# Patient Record
Sex: Male | Born: 1945
Health system: Southern US, Community
[De-identification: ages and names within clinical notes are randomized; demographics above are authoritative.]

## PROBLEM LIST (undated history)

## (undated) DIAGNOSIS — H9190 Unspecified hearing loss, unspecified ear: Secondary | ICD-10-CM

## (undated) DIAGNOSIS — E7800 Pure hypercholesterolemia, unspecified: Secondary | ICD-10-CM

## (undated) DIAGNOSIS — H269 Unspecified cataract: Secondary | ICD-10-CM

## (undated) DIAGNOSIS — I517 Cardiomegaly: Secondary | ICD-10-CM

## (undated) DIAGNOSIS — K7460 Unspecified cirrhosis of liver: Secondary | ICD-10-CM

## (undated) DIAGNOSIS — Z860101 Personal history of adenomatous and serrated colon polyps: Secondary | ICD-10-CM

## (undated) DIAGNOSIS — I4819 Other persistent atrial fibrillation: Secondary | ICD-10-CM

## (undated) DIAGNOSIS — K922 Gastrointestinal hemorrhage, unspecified: Secondary | ICD-10-CM

## (undated) DIAGNOSIS — D696 Thrombocytopenia, unspecified: Secondary | ICD-10-CM

## (undated) DIAGNOSIS — I4891 Unspecified atrial fibrillation: Secondary | ICD-10-CM

## (undated) DIAGNOSIS — E538 Deficiency of other specified B group vitamins: Secondary | ICD-10-CM

## (undated) DIAGNOSIS — K449 Diaphragmatic hernia without obstruction or gangrene: Secondary | ICD-10-CM

## (undated) DIAGNOSIS — I00 Rheumatic fever without heart involvement: Secondary | ICD-10-CM

## (undated) DIAGNOSIS — I2510 Atherosclerotic heart disease of native coronary artery without angina pectoris: Secondary | ICD-10-CM

## (undated) DIAGNOSIS — I10 Essential (primary) hypertension: Secondary | ICD-10-CM

## (undated) DIAGNOSIS — J449 Chronic obstructive pulmonary disease, unspecified: Secondary | ICD-10-CM

## (undated) DIAGNOSIS — Z86010 Personal history of colonic polyps: Secondary | ICD-10-CM

## (undated) DIAGNOSIS — K2970 Gastritis, unspecified, without bleeding: Secondary | ICD-10-CM

## (undated) DIAGNOSIS — D509 Iron deficiency anemia, unspecified: Secondary | ICD-10-CM

## (undated) HISTORY — DX: Unspecified hearing loss, unspecified ear: H91.90

## (undated) HISTORY — DX: Personal history of colonic polyps: Z86.010

## (undated) HISTORY — PX: SHOULDER SURGERY: SHX246

## (undated) HISTORY — DX: Deficiency of other specified B group vitamins: E53.8

## (undated) HISTORY — DX: Unspecified atrial fibrillation: I48.91

## (undated) HISTORY — DX: Other persistent atrial fibrillation: I48.19

## (undated) HISTORY — DX: Gastritis, unspecified, without bleeding: K29.70

## (undated) HISTORY — DX: Rheumatic fever without heart involvement: I00

## (undated) HISTORY — DX: Pure hypercholesterolemia, unspecified: E78.00

## (undated) HISTORY — DX: Personal history of adenomatous and serrated colon polyps: Z86.0101

## (undated) HISTORY — DX: Unspecified cataract: H26.9

## (undated) HISTORY — DX: Diaphragmatic hernia without obstruction or gangrene: K44.9

## (undated) HISTORY — DX: Unspecified cirrhosis of liver: K74.60

## (undated) HISTORY — PX: CORONARY ANGIOPLASTY: SHX604

## (undated) HISTORY — DX: Atherosclerotic heart disease of native coronary artery without angina pectoris: I25.10

## (undated) HISTORY — DX: Gastrointestinal hemorrhage, unspecified: K92.2

## (undated) HISTORY — DX: Iron deficiency anemia, unspecified: D50.9

## (undated) HISTORY — DX: Chronic obstructive pulmonary disease, unspecified: J44.9

## (undated) HISTORY — DX: Cardiomegaly: I51.7

---

## 2001-01-15 ENCOUNTER — Ambulatory Visit (HOSPITAL_COMMUNITY): Admission: RE | Admit: 2001-01-15 | Discharge: 2001-01-16 | Payer: Self-pay | Admitting: Cardiovascular Disease

## 2001-01-15 HISTORY — PX: CARDIAC CATHETERIZATION: SHX172

## 2001-08-11 ENCOUNTER — Ambulatory Visit (HOSPITAL_COMMUNITY): Admission: RE | Admit: 2001-08-11 | Discharge: 2001-08-12 | Payer: Self-pay | Admitting: Cardiovascular Disease

## 2001-08-11 HISTORY — PX: CARDIAC CATHETERIZATION: SHX172

## 2003-04-14 ENCOUNTER — Encounter (INDEPENDENT_AMBULATORY_CARE_PROVIDER_SITE_OTHER): Payer: Self-pay | Admitting: *Deleted

## 2003-04-14 ENCOUNTER — Ambulatory Visit (HOSPITAL_COMMUNITY): Admission: RE | Admit: 2003-04-14 | Discharge: 2003-04-14 | Payer: Self-pay | Admitting: Gastroenterology

## 2004-04-20 ENCOUNTER — Ambulatory Visit (HOSPITAL_COMMUNITY): Admission: RE | Admit: 2004-04-20 | Discharge: 2004-04-20 | Payer: Self-pay | Admitting: Family Medicine

## 2004-04-27 ENCOUNTER — Ambulatory Visit (HOSPITAL_COMMUNITY): Admission: RE | Admit: 2004-04-27 | Discharge: 2004-04-27 | Payer: Self-pay | Admitting: Urology

## 2004-05-22 ENCOUNTER — Ambulatory Visit (HOSPITAL_COMMUNITY): Admission: RE | Admit: 2004-05-22 | Discharge: 2004-05-22 | Payer: Self-pay | Admitting: Urology

## 2004-08-09 ENCOUNTER — Ambulatory Visit (HOSPITAL_COMMUNITY): Admission: RE | Admit: 2004-08-09 | Discharge: 2004-08-09 | Payer: Self-pay | Admitting: Family Medicine

## 2004-08-15 ENCOUNTER — Ambulatory Visit: Payer: Self-pay | Admitting: Gastroenterology

## 2004-09-04 ENCOUNTER — Ambulatory Visit (HOSPITAL_COMMUNITY): Admission: RE | Admit: 2004-09-04 | Discharge: 2004-09-04 | Payer: Self-pay | Admitting: Cardiovascular Disease

## 2004-10-10 ENCOUNTER — Ambulatory Visit: Payer: Self-pay | Admitting: Internal Medicine

## 2004-10-13 ENCOUNTER — Ambulatory Visit (HOSPITAL_COMMUNITY): Admission: RE | Admit: 2004-10-13 | Discharge: 2004-10-13 | Payer: Self-pay

## 2004-10-13 ENCOUNTER — Encounter (INDEPENDENT_AMBULATORY_CARE_PROVIDER_SITE_OTHER): Payer: Self-pay | Admitting: Specialist

## 2004-10-24 ENCOUNTER — Ambulatory Visit: Payer: Self-pay | Admitting: Internal Medicine

## 2004-11-27 ENCOUNTER — Ambulatory Visit: Payer: Self-pay | Admitting: Internal Medicine

## 2005-01-22 ENCOUNTER — Ambulatory Visit: Payer: Self-pay | Admitting: Internal Medicine

## 2005-04-23 ENCOUNTER — Ambulatory Visit: Payer: Self-pay | Admitting: Internal Medicine

## 2005-05-23 ENCOUNTER — Inpatient Hospital Stay (HOSPITAL_COMMUNITY): Admission: AD | Admit: 2005-05-23 | Discharge: 2005-05-24 | Payer: Self-pay | Admitting: Pediatrics

## 2005-05-23 ENCOUNTER — Encounter (INDEPENDENT_AMBULATORY_CARE_PROVIDER_SITE_OTHER): Payer: Self-pay | Admitting: Cardiology

## 2005-05-26 ENCOUNTER — Inpatient Hospital Stay (HOSPITAL_COMMUNITY): Admission: EM | Admit: 2005-05-26 | Discharge: 2005-05-29 | Payer: Self-pay | Admitting: Emergency Medicine

## 2005-05-28 HISTORY — PX: CARDIAC CATHETERIZATION: SHX172

## 2005-07-30 ENCOUNTER — Encounter: Admission: RE | Admit: 2005-07-30 | Discharge: 2005-07-30 | Payer: Self-pay | Admitting: Cardiovascular Disease

## 2005-08-21 ENCOUNTER — Ambulatory Visit: Payer: Self-pay | Admitting: Internal Medicine

## 2006-01-15 ENCOUNTER — Ambulatory Visit: Payer: Self-pay | Admitting: Internal Medicine

## 2006-04-30 ENCOUNTER — Encounter: Admission: RE | Admit: 2006-04-30 | Discharge: 2006-04-30 | Payer: Self-pay | Admitting: Cardiovascular Disease

## 2006-05-14 ENCOUNTER — Ambulatory Visit: Payer: Self-pay | Admitting: Gastroenterology

## 2006-06-10 ENCOUNTER — Ambulatory Visit: Payer: Self-pay | Admitting: Gastroenterology

## 2006-06-10 DIAGNOSIS — K2970 Gastritis, unspecified, without bleeding: Secondary | ICD-10-CM

## 2006-06-10 DIAGNOSIS — K449 Diaphragmatic hernia without obstruction or gangrene: Secondary | ICD-10-CM

## 2006-06-10 HISTORY — DX: Gastritis, unspecified, without bleeding: K29.70

## 2006-06-10 HISTORY — DX: Diaphragmatic hernia without obstruction or gangrene: K44.9

## 2006-06-29 ENCOUNTER — Emergency Department (HOSPITAL_COMMUNITY): Admission: EM | Admit: 2006-06-29 | Discharge: 2006-06-29 | Payer: Self-pay | Admitting: Emergency Medicine

## 2006-06-30 ENCOUNTER — Emergency Department (HOSPITAL_COMMUNITY): Admission: EM | Admit: 2006-06-30 | Discharge: 2006-06-30 | Payer: Self-pay | Admitting: Emergency Medicine

## 2007-05-13 ENCOUNTER — Ambulatory Visit: Payer: Self-pay | Admitting: Gastroenterology

## 2007-05-13 LAB — CONVERTED CEMR LAB
Basophils Absolute: 0.1 10*3/uL (ref 0.0–0.1)
Basophils Relative: 1 % (ref 0.0–1.0)
Eosinophils Absolute: 0.2 10*3/uL (ref 0.0–0.6)
Eosinophils Relative: 2.9 % (ref 0.0–5.0)
Ferritin: 92.5 ng/mL (ref 22.0–322.0)
Folate: 10.2 ng/mL
HCT: 26.7 % — ABNORMAL LOW (ref 39.0–52.0)
Hemoglobin: 8.3 g/dL — ABNORMAL LOW (ref 13.0–17.0)
Iron: 17 ug/dL — ABNORMAL LOW (ref 42–165)
Lymphocytes Relative: 20.3 % (ref 12.0–46.0)
MCHC: 31.1 g/dL (ref 30.0–36.0)
MCV: 86.9 fL (ref 78.0–100.0)
Monocytes Absolute: 0.5 10*3/uL (ref 0.2–0.7)
Monocytes Relative: 10.4 % (ref 3.0–11.0)
Neutro Abs: 3.3 10*3/uL (ref 1.4–7.7)
Neutrophils Relative %: 65.4 % (ref 43.0–77.0)
Platelets: 185 10*3/uL (ref 150–400)
RBC: 3.08 M/uL — ABNORMAL LOW (ref 4.22–5.81)
RDW: 16.4 % — ABNORMAL HIGH (ref 11.5–14.6)
Saturation Ratios: 3.8 % — ABNORMAL LOW (ref 20.0–50.0)
Transferrin: 321 mg/dL (ref >=212.0)
Vitamin B-12: 208 pg/mL — ABNORMAL LOW (ref 211–911)
WBC: 5.2 10*3/uL (ref 4.5–10.5)

## 2007-05-19 ENCOUNTER — Encounter: Admission: RE | Admit: 2007-05-19 | Discharge: 2007-05-19 | Payer: Self-pay | Admitting: Cardiovascular Disease

## 2007-06-10 ENCOUNTER — Ambulatory Visit: Payer: Self-pay | Admitting: Gastroenterology

## 2007-06-10 LAB — CONVERTED CEMR LAB
Ammonia: 31 umol/L (ref 11–35)
Basophils Absolute: 0.1 10*3/uL (ref 0.0–0.1)
Basophils Relative: 1.3 % — ABNORMAL HIGH (ref 0.0–1.0)
Eosinophils Absolute: 0.1 10*3/uL (ref 0.0–0.7)
Eosinophils Relative: 2.9 % (ref 0.0–5.0)
Ferritin: 119.3 ng/mL (ref 22.0–322.0)
Folate: 16.6 ng/mL
HCT: 37.2 % — ABNORMAL LOW (ref 39.0–52.0)
Hemoglobin: 11.6 g/dL — ABNORMAL LOW (ref 13.0–17.0)
Iron: 36 ug/dL — ABNORMAL LOW (ref 42–165)
Lymphocytes Relative: 25.8 % (ref 12.0–46.0)
MCHC: 31.1 g/dL (ref 30.0–36.0)
MCV: 87.3 fL (ref 78.0–100.0)
Monocytes Absolute: 0.4 10*3/uL (ref 0.1–1.0)
Monocytes Relative: 10.3 % (ref 3.0–12.0)
Neutro Abs: 2.4 10*3/uL (ref 1.4–7.7)
Neutrophils Relative %: 59.7 % (ref 43.0–77.0)
Platelets: 161 10*3/uL (ref 150–400)
RBC: 4.26 M/uL (ref 4.22–5.81)
RDW: 19.9 % — ABNORMAL HIGH (ref 11.5–14.6)
Saturation Ratios: 8.2 % — ABNORMAL LOW (ref 20.0–50.0)
Transferrin: 312.2 mg/dL (ref >=212.0)
Vitamin B-12: >1500 pg/mL — ABNORMAL HIGH (ref 211–911)
WBC: 4.1 10*3/uL — ABNORMAL LOW (ref 4.5–10.5)

## 2007-12-01 ENCOUNTER — Encounter: Payer: Self-pay | Admitting: Gastroenterology

## 2008-01-19 ENCOUNTER — Encounter: Payer: Self-pay | Admitting: Gastroenterology

## 2008-05-27 DIAGNOSIS — D696 Thrombocytopenia, unspecified: Secondary | ICD-10-CM

## 2008-05-27 HISTORY — DX: Thrombocytopenia, unspecified: D69.6

## 2008-06-12 ENCOUNTER — Emergency Department (HOSPITAL_COMMUNITY): Admission: EM | Admit: 2008-06-12 | Discharge: 2008-06-13 | Payer: Self-pay | Admitting: Emergency Medicine

## 2008-06-14 ENCOUNTER — Emergency Department (HOSPITAL_COMMUNITY): Admission: EM | Admit: 2008-06-14 | Discharge: 2008-06-14 | Payer: Self-pay | Admitting: Emergency Medicine

## 2008-08-11 ENCOUNTER — Encounter: Payer: Self-pay | Admitting: Gastroenterology

## 2008-10-11 DIAGNOSIS — K2970 Gastritis, unspecified, without bleeding: Secondary | ICD-10-CM | POA: Insufficient documentation

## 2008-10-11 DIAGNOSIS — Z860100 Personal history of colon polyps, unspecified: Secondary | ICD-10-CM | POA: Insufficient documentation

## 2008-10-11 DIAGNOSIS — E7800 Pure hypercholesterolemia, unspecified: Secondary | ICD-10-CM | POA: Insufficient documentation

## 2008-10-11 DIAGNOSIS — E538 Deficiency of other specified B group vitamins: Secondary | ICD-10-CM | POA: Insufficient documentation

## 2008-10-11 DIAGNOSIS — K2990 Gastroduodenitis, unspecified, without bleeding: Secondary | ICD-10-CM

## 2008-10-11 DIAGNOSIS — I2510 Atherosclerotic heart disease of native coronary artery without angina pectoris: Secondary | ICD-10-CM | POA: Insufficient documentation

## 2008-10-11 DIAGNOSIS — K449 Diaphragmatic hernia without obstruction or gangrene: Secondary | ICD-10-CM | POA: Insufficient documentation

## 2008-10-11 DIAGNOSIS — I4891 Unspecified atrial fibrillation: Secondary | ICD-10-CM | POA: Insufficient documentation

## 2008-10-11 DIAGNOSIS — D509 Iron deficiency anemia, unspecified: Secondary | ICD-10-CM | POA: Insufficient documentation

## 2008-10-12 ENCOUNTER — Ambulatory Visit: Payer: Self-pay | Admitting: Gastroenterology

## 2008-10-12 DIAGNOSIS — K921 Melena: Secondary | ICD-10-CM | POA: Insufficient documentation

## 2008-12-28 ENCOUNTER — Encounter: Payer: Self-pay | Admitting: Gastroenterology

## 2009-09-02 ENCOUNTER — Encounter: Payer: Self-pay | Admitting: Gastroenterology

## 2010-01-04 ENCOUNTER — Encounter: Payer: Self-pay | Admitting: Gastroenterology

## 2010-01-10 ENCOUNTER — Telehealth: Payer: Self-pay | Admitting: Gastroenterology

## 2010-02-14 ENCOUNTER — Ambulatory Visit: Payer: Self-pay | Admitting: Gastroenterology

## 2010-02-14 LAB — CONVERTED CEMR LAB
Folate: 16.6 ng/mL
Iron: 106 ug/dL (ref 42–165)
Saturation Ratios: 28.2 % (ref 20.0–50.0)
Transferrin: 268.3 mg/dL (ref 212.0–360.0)
Vitamin B-12: 347 pg/mL (ref 211–911)

## 2010-02-15 ENCOUNTER — Telehealth: Payer: Self-pay | Admitting: Gastroenterology

## 2010-03-01 ENCOUNTER — Encounter: Payer: Self-pay | Admitting: Internal Medicine

## 2010-03-10 ENCOUNTER — Encounter: Payer: Self-pay | Admitting: Internal Medicine

## 2010-03-19 ENCOUNTER — Encounter: Payer: Self-pay | Admitting: Family Medicine

## 2010-03-23 ENCOUNTER — Encounter: Payer: Self-pay | Admitting: Internal Medicine

## 2010-03-30 NOTE — Miscellaneous (Signed)
Summary: Tandem Refill  Clinical Lists Changes  Medications: Added new medication of TANDEM PLUS 162-115.2-1 MG CAPS (FEFUM-FEPO-FA-B CMP-C-ZN-MN-CU) take one by mouth once daily - Signed Rx of TANDEM PLUS 162-115.2-1 MG CAPS (FEFUM-FEPO-FA-B CMP-C-ZN-MN-CU) take one by mouth once daily;  #30 x 11;  Signed;  Entered by: Harlow Mares CMA;  Authorized by: Mardella Layman MD [facility];  Method used: Electronically to [facility] [address], [place], [place], [place]  [address], Ph: [phone], Fax: [phone]    Prescriptions: TANDEM PLUS 162-115.2-1 MG CAPS (FEFUM-FEPO-FA-B CMP-C-ZN-MN-CU) take one by mouth once daily  #30 x 11   Entered by:   Harlow Mares CMA   Authorized by:   Mardella Layman MD FACG,FAGA   Signed by:   Harlow Mares CMA on 01/19/2008   Method used:   Electronically to        [facility]  [address] (retail)       [address]       [place], [place]  [address]       Ph: [phone]       Fax: [phone]   RxID:   [id number]

## 2010-03-30 NOTE — Assessment & Plan Note (Signed)
Summary: med refill--ch.    History of Present Illness Visit Type: Follow-up Visit Primary GI MD: Sheryn Bison MD FACP FAGA Primary Provider: Rudi Heap, MD Requesting Provider: n/a Chief Complaint: Medication refill on Hemocyte plus, wants cheaper medication History of Present Illness:   this patient is currently asymptomatic and denies any GI complaints. He has regular bowel movements without melena or hematochezia. His chronic iron deficiency has been corrected but he continues on aspirin and Coumadin. He denies current cardiovascular problems except for easy bruisability. Recent labs reviewed from Dr. Alden Server all normal with a hemoglobin of 15. He also relates that Hemoccult cards were guaiac negative.   GI Review of Systems      Denies abdominal pain, acid reflux, belching, bloating, chest pain, dysphagia with liquids, dysphagia with solids, heartburn, loss of appetite, nausea, vomiting, vomiting blood, weight loss, and  weight gain.        Denies anal fissure, black tarry stools, change in bowel habit, constipation, diarrhea, diverticulosis, fecal incontinence, heme positive stool, hemorrhoids, irritable bowel syndrome, jaundice, light color stool, liver problems, rectal bleeding, and  rectal pain.    Current Medications (verified): 1)  Hemocyte Plus 106-1 Mg Caps (B Complex-C-Min-Fe-Fa) .Marland Kitchen.. 1 By Mouth Once Daily .Marland Kitchen...must Have Office Visit 2)  Verapamil Hcl Cr 240 Mg Xr24h-Cap (Verapamil Hcl) .... Take One By Mouth Once Daily 3)  Aspirin 81 Mg Tbec (Aspirin) .... Take One By Mouth Once Daily 4)  Lasix 20 Mg Tabs (Furosemide) .... Take One By Mouth Every Other Day 5)  Klor-Con 10 10 Meq Cr-Tabs (Potassium Chloride) .Marland Kitchen.. 1 Tablet By Mouth Once Daily 6)  Coumadin 7.5 Mg Tabs (Warfarin Sodium) .... Take As Directed  Tues and Thurs 7)  Crestor 10 Mg Tabs (Rosuvastatin Calcium) .... Take One By Mouth At Bedtime 8)  Omeprazole 20 Mg Cpdr (Omeprazole) .Marland Kitchen.. 1 Capsule By Mouth  Once Daily 9)  Coumadin 5 Mg Tabs (Warfarin Sodium) .... Take Mon, Wed, Fri, Sat and Sun 10)  Vitamin D3 2000 Unit Tabs (Cholecalciferol) .Marland Kitchen.. 1 By Mouth Two Times A Day  Allergies (verified): 1)  ! Tetracycline  Past History:  Family History: Last updated: 10/11/2008 Family History of Colon Cancer: paternal grandfather  Social History: Last updated: 10/11/2008 Occupation: retired from [employer] Married with two sons and three daughters Patient has never smoked.  Alcohol Use - no Illicit Drug Use - no  Past medical, surgical, family and social histories (including risk factors) reviewed for relevance to current acute and chronic problems.  Past Medical History: Reviewed history from 10/11/2008 and no changes required. Current Problems:  FIBRILLATION, ATRIAL (ICD-427.31) CORONARY ARTERY DISEASE (ICD-414.00) HYPERCHOLESTEROLEMIA (ICD-272.0) COUMADIN THERAPY (ICD-V58.61) ANEMIA, IRON DEFICIENCY (ICD-280.9) B12 DEFICIENCY (ICD-266.2) COLONIC POLYPS, ADENOMATOUS, HX OF (ICD-V12.72) GASTRITIS (ICD-535.50) HIATAL HERNIA WITH REFLUX (ICD-553.3)  Past Surgical History: Reviewed history from 10/11/2008 and no changes required. angioplasty 2002 and 2003  Family History: Reviewed history from 10/11/2008 and no changes required. Family History of Colon Cancer: paternal grandfather  Social History: Reviewed history from 10/11/2008 and no changes required. Occupation: retired from Patent examiner Married with two sons and three daughters Patient has never smoked.  Alcohol Use - no Illicit Drug Use - no  Review of Systems  The patient denies allergy/sinus, anemia, anxiety-new, arthritis/joint pain, back pain, blood in urine, breast changes/lumps, change in vision, confusion, cough, coughing up blood, depression-new, fainting, fatigue, fever, headaches-new, hearing problems, heart murmur, heart rhythm changes, itching, menstrual pain, muscle pains/cramps, night sweats,  nosebleeds, pregnancy  symptoms, shortness of breath, skin rash, sleeping problems, sore throat, swelling of feet/legs, swollen lymph glands, thirst - excessive , urination - excessive , urination changes/pain, urine leakage, vision changes, and voice change.    Vital Signs:  Patient profile:   65 year old male Height:      69.5 inches Weight:      211 pounds BMI:     30.82 BSA:     2.13 Pulse rate:   68 / minute Pulse rhythm:   regular BP sitting:   130 / 64  (left arm)  Vitals Entered By: Merri Ray CMA Duncan Dull) (February 14, 2010 11:26 AM)  Physical Exam  General:  Well developed, well nourished, no acute distress.healthy appearing.   Head:  Normocephalic and atraumatic. Eyes:  PERRLA, no icterus.exam deferred to patient's ophthalmologist.   Abdomen:  Soft, nontender and nondistended. No masses, hepatosplenomegaly or hernias noted. Normal bowel sounds. Extremities:  No clubbing, cyanosis, edema or deformities noted.numerous ecchymoses and bruises noted over his extremities. Psych:  Alert and cooperative. Normal mood and affect.   Impression & Recommendations:  Problem # 1:  ANEMIA, IRON DEFICIENCY (ICD-280.9) Assessment Improved iron levels order to better advise this patient as to discontinuation of oral iron therapy. It is essential that he continue on daily PPI therapy with his aspirin and Coumadin and history of previous chronic GI blood loss. He is up-to-date on his colonoscopy and endoscopy exams. Orders: TLB-B12, Serum-Total ONLY (82607-B12) TLB-Iron, (Fe) Total (83540-FE) TLB-IBC Pnl (Iron/FE;Transferrin) (83550-IBC) TLB-Folic Acid (Folate) (82746-FOL)  Problem # 2:  CORONARY ARTERY DISEASE (ICD-414.00) Assessment: Unchanged continue other cardiac medications per Dr. Rudi Heap and cardiology.  Patient Instructions: 1)  Your physician requests that you go to the basement floor of our office to have the following labwork completed before leaving today: Anemia  Profile 2)  Copy sent to : Rudi Heap, MD 3)  The medication list was reviewed and reconciled.  All changed / newly prescribed medications were explained.  A complete medication list was provided to the patient / caregiver.

## 2010-03-30 NOTE — Medication Information (Signed)
Summary: Prior authorization & Approved for Omeprazole/[facility]  Prior authorization & Approved for Omeprazole/[facility]   Imported By: Sherian Rein 12/31/2008 11:52:25  _____________________________________________________________________  External Attachment:    Type:   Image     Comment:   External Document

## 2010-03-30 NOTE — Medication Information (Signed)
Summary: Approved/[facility]  Approved/[facility]   Imported By: Lester Claycomo 01/11/2010 10:21:35  _____________________________________________________________________  External Attachment:    Type:   Image     Comment:   External Document

## 2010-03-30 NOTE — Progress Notes (Signed)
Summary: Lab results   Phone Note Call from Patient Call back at Home Phone [phone]   Caller: Patient Call For: Dr. Jarold Motto Reason for Call: Lab or Test Results Summary of Call: Calling about his Lab results Initial call taken by: Karna Christmas,  February 15, 2010 3:59 PM  Follow-up for Phone Call        ContinuePatient notified of laboratory results Dr Jarold Motto he says you discussed stopping the iron if labs were ok.  Please advise. Follow-up by: Darcey Nora RN, CGRN,  February 15, 2010 4:44 PM  Additional Follow-up for Phone Call Additional follow up Details #1::        can stop and check hb in 6 weeks Additional Follow-up by: Mardella Layman MD Clementeen Graham,  February 16, 2010 8:19 AM    Additional Follow-up for Phone Call Additional follow up Details #2::    patient aware.  new lab orders entered for 03/31/10 Follow-up by: Darcey Nora RN, CGRN,  February 16, 2010 9:52 AM

## 2010-03-30 NOTE — Progress Notes (Signed)
Summary: Medication   Phone Note Call from Patient Call back at Home Phone [phone]   Caller: Patient Call For: Dr. Jarold Motto Details for Reason: Medication Summary of Call: Pt. called stating he had received authorization for his medication in the mail today. Please call. Initial call taken by: Schuyler Amor,  January 10, 2010 3:34 PM  Follow-up for Phone Call        pt recieved auth in the mail today and he called pharm and they have his rx ready now. Follow-up by: Harlow Mares CMA Duncan Dull),  January 10, 2010 3:37 PM

## 2010-03-30 NOTE — Assessment & Plan Note (Signed)
Summary: YEARLY F-UP/YF    History of Present Illness Visit Type: Follow-up Visit Primary GI MD: Sheryn Bison MD FACP FAGA Primary Provider: Rudi Heap, MD Requesting Provider: Rudi Heap, MD Chief Complaint: Hemoccult positive stool card  History of Present Illness:   Joshua [name] had a Hemoccult positive stool occult blood testing. His hemoglobin was 14.5 up from 8.6 one year ago when he was found to have erosive gastritis related to aspirin and NSAID use. Colonoscopy was 2 years ago was unremarkable. He continues on daily omeprazole 20 mg and also aspirin 81 mg a day and he takes daily tandem for iron replacement. He is totally asymptomatic in terms of any GI complaints. This of note he also is chronically coumadinized cause of his atrial fibrillation and coronary artery disease. He additionally is on B12 replacement therapy. Septae is good and his weight is stable. He denies other NSAID use.   GI Review of Systems      Denies abdominal pain, acid reflux, belching, bloating, chest pain, dysphagia with liquids, dysphagia with solids, heartburn, loss of appetite, nausea, vomiting, vomiting blood, weight loss, and  weight gain.        Denies anal fissure, black tarry stools, change in bowel habit, constipation, diarrhea, diverticulosis, fecal incontinence, heme positive stool, hemorrhoids, irritable bowel syndrome, jaundice, light color stool, liver problems, rectal bleeding, and  rectal pain.    Current Medications (verified): 1)  Tandem Plus 162-115.2-1 Mg Caps (Fefum-Fepo-Fa-B Cmp-C-Zn-Mn-Cu) .... Take One By Mouth Once Daily 2)  Verapamil Hcl Cr 240 Mg Xr24h-Cap (Verapamil Hcl) .... Take One By Mouth Once Daily 3)  Aspirin 81 Mg Tbec (Aspirin) .... Take One By Mouth Once Daily 4)  Lasix 20 Mg Tabs (Furosemide) .... Take One By Mouth Every Other Day 5)  Klor-Con 10 10 Meq Cr-Tabs (Potassium Chloride) .Marland Kitchen.. 1 Tablet By Mouth Once Daily 6)  Coumadin 7.5 Mg Tabs (Warfarin Sodium) .... Take  As Directed  Tues and Thurs 7)  Crestor 10 Mg Tabs (Rosuvastatin Calcium) .... Take One By Mouth At Bedtime 8)  Omeprazole 20 Mg Cpdr (Omeprazole) .Marland Kitchen.. 1 Capsule By Mouth Once Daily 9)  Coumadin 5 Mg Tabs (Warfarin Sodium) .... Take Mon, Wed, Fri, Sat and Sun  Allergies (verified): 1)  ! Tetracycline  Past History:  Past medical, surgical, family and social histories (including risk factors) reviewed for relevance to current acute and chronic problems.  Past Medical History: Reviewed history from 10/11/2008 and no changes required. Current Problems:  FIBRILLATION, ATRIAL (ICD-427.31) CORONARY ARTERY DISEASE (ICD-414.00) HYPERCHOLESTEROLEMIA (ICD-272.0) COUMADIN THERAPY (ICD-V58.61) ANEMIA, IRON DEFICIENCY (ICD-280.9) B12 DEFICIENCY (ICD-266.2) COLONIC POLYPS, ADENOMATOUS, HX OF (ICD-V12.72) GASTRITIS (ICD-535.50) HIATAL HERNIA WITH REFLUX (ICD-553.3)  Past Surgical History: Reviewed history from 10/11/2008 and no changes required. angioplasty 2002 and 2003  Family History: Reviewed history from 10/11/2008 and no changes required. Family History of Colon Cancer: paternal grandfather  Social History: Reviewed history from 10/11/2008 and no changes required. Occupation: retired from Patent examiner Married with two sons and three daughters Patient has never smoked.  Alcohol Use - no Illicit Drug Use - no  Review of Systems  The patient denies allergy/sinus, anemia, anxiety-new, arthritis/joint pain, back pain, blood in urine, breast changes/lumps, change in vision, confusion, cough, coughing up blood, depression-new, fainting, fatigue, fever, headaches-new, hearing problems, heart murmur, heart rhythm changes, itching, menstrual pain, muscle pains/cramps, night sweats, nosebleeds, pregnancy symptoms, shortness of breath, skin rash, sleeping problems, sore throat, swelling of feet/legs, swollen lymph glands, thirst - excessive , urination -  excessive , urination changes/pain,  urine leakage, vision changes, and voice change.    Vital Signs:  Patient profile:   65 year old male Height:      69.5 inches Weight:      199 pounds BMI:     29.07 Pulse rate:   64 / minute Pulse rhythm:   regular BP sitting:   140 / 60  (left arm)  Vitals Entered By: Milford Cage NCMA (October 12, 2008 3:31 PM)  Physical Exam  General:  Well developed, well nourished, no acute distress.healthy appearing.   Head:  Normocephalic and atraumatic. Eyes:  PERRLA, no icterus.exam deferred to patient's ophthalmologist.   Neck:  Supple; no masses or thyromegaly. Lungs:  decreased BS on L and decreased BS on R.   Heart:  Regular rate and rhythm; no murmurs, rubs,  or bruits.heart murmur systolic:.   Abdomen:  Soft, nontender and nondistended. No masses, hepatosplenomegaly or hernias noted. Normal bowel sounds. Msk:  Symmetrical with no gross deformities. Normal posture. Extremities:  No clubbing, cyanosis, edema or deformities noted.Superficial varicosities are noted in the lower extremities. There is no evidence of acute phlebitis. Neurologic:  Alert and  oriented x4;  grossly normal neurologically. Psych:  Alert and cooperative. Normal mood and affect.   Impression & Recommendations:  Problem # 1:  HEMOCCULT POSITIVE STOOL (ICD-578.1) Assessment Unchanged I feel this is probably from erosive gastritis associated with his salicylate use and do not think we need to repeat his GI evaluation at this time. His hemoglobin has steadily risen to normal levels on iron therapy and B12 replacement and I've advised him to continue the use as previously outlined. I have suggested to him that he have CBC every 6 months as per Dr. Christell Constant. Should he have evidence of advancing anemia, capsule endoscopy exam would seem reasonable.  Problem # 2:  FIBRILLATION, ATRIAL (ICD-427.31) Assessment: Improved continue multiple medications per cardiology including anticoagulation. In addition to his atrial  fibrillation he has hypertrophic cardiomyopathy, coronary artery disease, and previous cardiac stents. He is followed by Dr. Daphene Jaeger and also by Dr. Jetty Duhamel for idiopathic pleural effusions.  Problem # 3:  ANEMIA, IRON DEFICIENCY (ICD-280.9) Assessment: Improved  Problem # 4:  B12 DEFICIENCY (ICD-266.2) Assessment: Improved  Problem # 5:  COLONIC POLYPS, ADENOMATOUS, HX OF (ICD-V12.72) Assessment: Unchanged He has some adenomatous polyps removed in 2005 but had a negative colonoscopy in 2008.  Patient Instructions: 1)  Copy sent to : Dr. Rudi Heap and Dr. Nicki Guadalajara 2)  Please continue current medications.  3)  Please schedule a follow-up appointment as needed.

## 2010-03-30 NOTE — Progress Notes (Signed)
Summary: Refill   Phone Note Call from Patient Call back at Home Phone [phone]   Call For: Dr Jarold Motto Reason for Call: Refill Medication Summary of Call: Needs a refill on his Omneprozole sent to [facility] Initial call taken by: Leanor Kail [facility],  January 10, 2010 1:49 PM  Follow-up for Phone Call        rx has already been sent and the patients insurance requires a prior auth which I have completed the form and I am waiting on a response from his insurance company and when I hear back I will call him and notify his pharm. Follow-up by: Harlow Mares CMA Duncan Dull),  January 10, 2010 2:04 PM

## 2010-03-30 NOTE — Medication Information (Signed)
Summary: Approval for Omeprazole/[employer]  Approval for Omeprazole/[employer]   Imported By: Esmeralda Links D'jimraou 12/10/2007 15:14:44  _____________________________________________________________________  External Attachment:    Type:   Image     Comment:   External Document

## 2010-03-31 ENCOUNTER — Encounter: Payer: Self-pay | Admitting: Internal Medicine

## 2010-03-31 ENCOUNTER — Ambulatory Visit: Admit: 2010-03-31 | Payer: Self-pay | Admitting: Gastroenterology

## 2010-03-31 ENCOUNTER — Other Ambulatory Visit: Payer: Medicare Other

## 2010-03-31 ENCOUNTER — Institutional Professional Consult (permissible substitution) (INDEPENDENT_AMBULATORY_CARE_PROVIDER_SITE_OTHER): Payer: Medicare Other | Admitting: Internal Medicine

## 2010-03-31 DIAGNOSIS — I4891 Unspecified atrial fibrillation: Secondary | ICD-10-CM

## 2010-03-31 DIAGNOSIS — I421 Obstructive hypertrophic cardiomyopathy: Secondary | ICD-10-CM | POA: Insufficient documentation

## 2010-03-31 DIAGNOSIS — I4892 Unspecified atrial flutter: Secondary | ICD-10-CM

## 2010-03-31 LAB — CONVERTED CEMR LAB
BUN: 24 mg/dL — ABNORMAL HIGH (ref 6–23)
Basophils Absolute: 0 10*3/uL (ref 0.0–0.1)
Basophils Relative: 0 % (ref 0–1)
CO2: 29 meq/L (ref 19–32)
Calcium: 8.7 mg/dL (ref 8.4–10.5)
Chloride: 100 meq/L (ref 96–112)
Creatinine, Ser: 1.06 mg/dL (ref 0.40–1.20)
Eosinophils Absolute: 0.2 10*3/uL (ref 0.0–0.7)
Eosinophils Relative: 3 % (ref 0–5)
Glucose, Bld: 90 mg/dL (ref 70–99)
HCT: 45.5 % (ref 36.0–46.0)
Hemoglobin: 14.8 g/dL (ref 12.0–15.0)
INR: 2.34 — ABNORMAL HIGH (ref <1.50)
Lymphocytes Relative: 20 % (ref 12–46)
Lymphs Abs: 1.2 10*3/uL (ref 0.7–4.0)
MCHC: 32.5 g/dL (ref 30.0–36.0)
MCV: 97 fL (ref 78.0–100.0)
Monocytes Absolute: 0.1 10*3/uL (ref 0.1–1.0)
Monocytes Relative: 10 % (ref 3–12)
Neutro Abs: 3.8 10*3/uL (ref 1.7–7.7)
Neutrophils Relative %: 67 % (ref 43–77)
Platelets: 141 10*3/uL — ABNORMAL LOW (ref 150–400)
Potassium: 4.3 meq/L (ref 3.5–5.3)
Prothrombin Time: 25.8 s — ABNORMAL HIGH (ref 11.6–15.2)
RBC: 4.69 M/uL (ref 3.87–5.11)
RDW: 14 % (ref 11.5–15.5)
Sodium: 137 meq/L (ref 135–145)
WBC: 5.7 10*3/uL (ref 4.0–10.5)
aPTT: 45 s — ABNORMAL HIGH (ref 24–37)

## 2010-04-03 ENCOUNTER — Encounter: Payer: Self-pay | Admitting: Internal Medicine

## 2010-04-03 ENCOUNTER — Ambulatory Visit (HOSPITAL_COMMUNITY)
Admission: RE | Admit: 2010-04-03 | Discharge: 2010-04-03 | Disposition: A | Discharge status: Home / Self Care | Payer: Medicare Other | Source: Ambulatory Visit | Attending: Internal Medicine | Admitting: Internal Medicine

## 2010-04-03 DIAGNOSIS — Z79899 Other long term (current) drug therapy: Secondary | ICD-10-CM | POA: Insufficient documentation

## 2010-04-03 DIAGNOSIS — I2510 Atherosclerotic heart disease of native coronary artery without angina pectoris: Secondary | ICD-10-CM | POA: Insufficient documentation

## 2010-04-03 DIAGNOSIS — I509 Heart failure, unspecified: Secondary | ICD-10-CM | POA: Insufficient documentation

## 2010-04-03 DIAGNOSIS — K219 Gastro-esophageal reflux disease without esophagitis: Secondary | ICD-10-CM | POA: Insufficient documentation

## 2010-04-03 DIAGNOSIS — I4891 Unspecified atrial fibrillation: Secondary | ICD-10-CM | POA: Insufficient documentation

## 2010-04-03 DIAGNOSIS — Z7902 Long term (current) use of antithrombotics/antiplatelets: Secondary | ICD-10-CM | POA: Insufficient documentation

## 2010-04-03 DIAGNOSIS — D509 Iron deficiency anemia, unspecified: Secondary | ICD-10-CM | POA: Insufficient documentation

## 2010-04-03 DIAGNOSIS — I4892 Unspecified atrial flutter: Secondary | ICD-10-CM

## 2010-04-03 DIAGNOSIS — R5381 Other malaise: Secondary | ICD-10-CM | POA: Insufficient documentation

## 2010-04-03 DIAGNOSIS — Z7901 Long term (current) use of anticoagulants: Secondary | ICD-10-CM | POA: Insufficient documentation

## 2010-04-03 LAB — BASIC METABOLIC PANEL
BUN: 23 mg/dL (ref 6–23)
CO2: 29 mEq/L (ref 19–32)
Calcium: 8.8 mg/dL (ref 8.4–10.5)
Chloride: 106 mEq/L (ref 96–112)
Creatinine, Ser: 0.89 mg/dL (ref 0.4–1.5)
GFR calc Af Amer: >60 mL/min (ref >60)
GFR calc non Af Amer: >60 mL/min (ref >60)
Glucose, Bld: 98 mg/dL (ref 70–99)
Potassium: 4.4 mEq/L (ref 3.5–5.1)
Sodium: 142 mEq/L (ref 135–145)

## 2010-04-03 LAB — CBC
HCT: 42.8 % (ref 39.0–52.0)
Hemoglobin: 14.1 g/dL (ref 13.0–17.0)
MCH: 31.2 pg (ref 26.0–34.0)
MCHC: 32.9 g/dL (ref 30.0–36.0)
MCV: 94.7 fL (ref 78.0–100.0)
Platelets: 132 10*3/uL — ABNORMAL LOW (ref 150–400)
RBC: 4.52 MIL/uL (ref 4.22–5.81)
RDW: 13.8 % (ref 11.5–15.5)
WBC: 5.3 10*3/uL (ref 4.0–10.5)

## 2010-04-03 LAB — PROTIME-INR
INR: 2.59 — ABNORMAL HIGH (ref 0.00–1.49)
Prothrombin Time: 27.9 seconds — ABNORMAL HIGH (ref 11.6–15.2)

## 2010-04-05 ENCOUNTER — Institutional Professional Consult (permissible substitution): Payer: Medicare Other | Admitting: Cardiology

## 2010-04-05 NOTE — Letter (Signed)
Summary: [facility] 2010 - 2012  [facility] 2010 - 2012   Imported By: Marylou Mccoy 03/30/2010 15:38:26  _____________________________________________________________________  External Attachment:    Type:   Image     Comment:   External Document

## 2010-04-05 NOTE — Assessment & Plan Note (Signed)
Summary: ZO:XWRU and a flutter/per kay pt has medicare/bcbs state/lg   Vital Signs:  Patient profile:   65 year old male Height:      69.5 inches Weight:      209.50 pounds BMI:     30.60 Pulse rate:   57 / minute Pulse rhythm:   irregular BP sitting:   112 / 61  (left arm) Cuff size:   large  Vitals Entered By: Joshua [name], CMA (March 31, 2010 2:53 PM)  Visit Type:  Initial Consult Referring Provider:  Dr Tresa Endo Primary Provider:  Rudi Heap, MD  CC:  sob...no energy.  History of Present Illness: Joshua [name] is a pleasant 65 yo WM with a h/o hypertrophic cardiomyopathy, atrial fibrillation, and recently discovered atrial flutter.  He reports being in good health until 1988 when he noticed decreased exercise tolerance.  This progressed to CHF.  He was eventually diagnosed with hypertrophic CM.  He has been managed medically by Dr Tresa Endo and has done very well.  04/2005 he developed acute dyspnea and was found to have atrial fibrillation.  He was placed on coumadin and admitted for observation.  He reports spontaneously converting to sinus rhythm.  He has not required antiarrhythmic drug therapy and is unaware of any further episodes of atrial fibrillation.  He reports doing very well over the past two years. He states that he has been able to do his own yard work and has been pleased with his health state.   12/11 develped abrupt fatigue and decreased exercise tolerance with progressive BLE edema.  He was evaluated by Dr Tresa Endo and found to have typical appearing atrial flutter.  He was started on metoprolol for rate control.  He reports mild improvement with metoprolol.  He is now referred for EP consultation.   He has a h/o rheumatic and typhoid fever as a child.    Current Medications (verified): 1)  Hemocyte Plus 106-1 Mg Caps (B Complex-C-Min-Fe-Fa) .Marland Kitchen.. 1 By Mouth Once Daily .Marland Kitchen...must Have Office Visit 2)  Verapamil Hcl Cr 240 Mg Xr24h-Cap (Verapamil Hcl) .... Take One By Mouth  Once Daily 3)  Aspirin 81 Mg Tbec (Aspirin) .... Take One By Mouth Once Daily 4)  Lasix 20 Mg Tabs (Furosemide) .Marland Kitchen.. 1 Tab Once Daily 5)  Klor-Con 10 10 Meq Cr-Tabs (Potassium Chloride) .Marland Kitchen.. 1 Tablet By Mouth Once Daily 6)  Coumadin 7.5 Mg Tabs (Warfarin Sodium) .... Take As Directed  Tues and Thurs 7)  Crestor 10 Mg Tabs (Rosuvastatin Calcium) .... Take One By Mouth At Bedtime 8)  Omeprazole 20 Mg Cpdr (Omeprazole) .Marland Kitchen.. 1 Capsule By Mouth Once Daily 9)  Coumadin 5 Mg Tabs (Warfarin Sodium) .... Take Mon, Wed, Fri, Sat and Sun 10)  Vitamin D3 2000 Unit Tabs (Cholecalciferol) .Marland Kitchen.. 1 By Mouth Two Times A Day 11)  Metoprolol Tartrate 50 Mg Tabs (Metoprolol Tartrate) .... 1/2 Tab Qam...1/2 Tab Qpm  Allergies: 1)  ! Tetracycline  Past History:  Past Medical History: FIBRILLATION, ATRIAL (ICD-427.31) CORONARY ARTERY DISEASE (ICD-414.00) s/p PCI HYPERCHOLESTEROLEMIA  Hypertrophic CM ANEMIA, IRON DEFICIENCY (ICD-280.9) B12 DEFICIENCY (ICD-266.2) COLONIC POLYPS, ADENOMATOUS, HX OF (ICD-V12.72) GASTRITIS (ICD-535.50) HIATAL HERNIA WITH REFLUX (ICD-553.3) h/o rheumatic fever/ typhoid fever poor hearing DJD  Past Surgical History: angioplasty 2002 and 2003 shoulder surgery  Family History: Family History of Colon Cancer: paternal grandfather mother had an "enlarged heart" brother died suddenly age 30s following a prolonged episode of chest pain, presumed to be due to MI.  Autopsy per pt revealed  Ventricular rupture.  Pt is not sure that he had HCM.  Social History: Pt lives in [place]. Occupation: retired from Patent examiner Married with two sons and three daughters Patient briefly smoked in service. Alcohol Use - no Illicit Drug Use - no  Review of Systems       All systems are reviewed and negative except as listed in the HPI.   Physical Exam  General:  Well developed, well nourished, in no acute distress. Head:  normocephalic and atraumatic Eyes:  PERRLA/EOM  intact; conjunctiva and lids normal. Mouth:  Teeth, gums and palate normal. Oral mucosa normal. Neck:  supple Lungs:  Clear bilaterally to auscultation and percussion. Heart:  iRRR, 3/6 SEM LUSB Abdomen:  Bowel sounds positive; abdomen soft and non-tender without masses, organomegaly, or hernias noted. No hepatosplenomegaly. Msk:  Back normal, normal gait. Muscle strength and tone normal. Extremities:  1+ BLE edema Neurologic:  Alert and oriented x 3. Skin:  Intact without lesions or rashes. Psych:  Normal affect.   Impression & Recommendations:  Problem # 1:  ATRIAL FLUTTER (ICD-427.32) The patient has new onset and very symptomatic typical appearing atrial flutter.  His EKG today reveals typicall appearing atrial flutter with V rate 60 bpm and LVH.  He also carries a h/o afib and is aware of only 1 episode of afib in 2007.  He has not previously required cardioversion or AAD therapy. Therapeutic strategies for atrial flutter including medicine and ablation were discussed in detail with the patient today. Risk, benefits, and alternatives to cardioversion as well as radiofrequency ablation were also discussed in detail today.  The patient is very clear that he would prefer cardioversion at this time.  IF he has recurrent atrial flutter after cardioversion, then he would be willing to consider ablation at that time.  If he has recurrent afib, then he would consider an antiarrhythmic medicine (his options would be norpace or amiodarone). He has been appropriately anticoagulated with coumadin with confirmed therapeutic INRs over the past 4 weeks.  We will therefore proceed with cardioversion at the next available time.  Problem # 2:  HYPERTROPHIC OBSTRUCTIVE CARDIOMYOPATHY (ICD-425.11) stable continue current medical therapy  Problem # 3:  CORONARY ARTERY DISEASE (ICD-414.00) stable no symtoms of ischemia  Problem # 4:  FIBRILLATION, ATRIAL (ICD-427.31) as above continue coumadin  longterm given h/o rheumatic fever and HCM, he is very likely to have recurrent afib down the road  Other Orders: T-Basic Metabolic Panel [phone]) T-CBC w/Diff [phone]) T-Protime, Auto ([id number]) T-PTT [phone])  Patient Instructions: 1)  Your physician has recommended that you have a cardioversion (DCCV).  Electrical cardioversion uses a jolt of electricity to your heart either through paddles or wired patches attached to your chest. This is a controlled, usually prescheduled, procedure. Defibrillation is done under light anesthesia in the hospital, and you usually go home the day of the procedure. This is done to get your heart back into a normal rhythm. You are not awake for the procedure. Please see the instruction sheet given to you today.   Orders Added: 1)  T-Basic Metabolic Panel [[id number]] 2)  T-CBC w/Diff [[id number]] 3)  T-Protime, Auto [[id number]] 4)  T-PTT [[id number]]

## 2010-04-05 NOTE — Letter (Signed)
Summary: Cardioversion/TEE Instructions  Architectural technologist, Main Office  [address]   [place], [place] [address]   Phone: [phone]  Fax: [phone]    Cardioversion / TEE Cardioversion Instructions  03/31/2010 MRN: [id number]  [facility] [address] [place], [place]  [address]  Dear Mr. SCALLON, You are scheduled for a Cardioversion  on 04/03/10 with Dr. [name].   Please arrive at the [facility] at 7:00 a.m.  on the day of your procedure.  1)   DIET:  A)   Nothing to eat or drink after midnight except your medications with a sip of water.    2)   Come to the [place] office on 03/31/10 for lab work. You do not have to be fasting.  3)   MAKE SURE YOU TAKE YOUR COUMADIN.  4)   A)   DO NOT TAKE these medications before your procedure: Lasix and Metoprolol          5)  Must have a responsible person to drive you home.  6)   Bring a current list of your medications and current insurance cards.   * Special Note:  Every effort is made to have your procedure done on time. Occasionally there are emergencies that present themselves at the hospital that may cause delays. Please be patient if a delay does occur.  * If you have any questions after you get home, please call the office at [phone]. Anselm Pancoast

## 2010-04-12 NOTE — Op Note (Signed)
  NAME:  Joshua [name], Joshua [name] NO.:  [id number]  MEDICAL RECORD NO.:  [id number]           PATIENT TYPE:  O  LOCATION:  [facility]                         FACILITY:  [facility]  PHYSICIAN:  Hillis Range, MD       DATE OF BIRTH:  [birth year]  DATE OF PROCEDURE: DATE OF DISCHARGE:  04/03/2010                              OPERATIVE REPORT   SURGEON:  Hillis Range, MD  PREPROCEDURE DIAGNOSIS:  Typical-appearing atrial flutter.  POSTPROCEDURE DIAGNOSES:  Typical-appearing atrial flutter.  PROCEDURE:  Cardioversion.  INTRODUCTION:  [name] is a very pleasant 65 year old gentleman with a history of rheumatic fever as a child with prior paroxysmal atrial fibrillation and hypertrophic cardiomyopathy, who presents with symptomatic typical-appearing atrial flutter.  He is presently rate control, continues to have fatigue.  He has been treated with Coumadin longstanding and has had a therapeutic INR confirmed.  Therapeutic strategies for his atrial flutter were discussed at length.  At this time, the patient is very clear that he wishes to proceed with cardioversion.  DESCRIPTION OF PROCEDURE:  Informed written consent was obtained and the patient was brought to the short-stay area in the fasting state.  He was confirmed to be in atrial flutter upon presentation.  IV access and adequate airway support were assured by Anesthesia.  The patient was then sedated with intravenous propofol as outlined in the anesthesia report.  Cardioversion electrodes were placed in the anterior-posterior thoracic configuration.  A single 200-joule biphasic synchronized shock was delivered, which successfully converted the patient from atrial flutter to sinus rhythm.  He remained in sinus rhythm thereafter.  There were no early apparent complications.  CONCLUSIONS: 1. Successful cardioversion of atrial flutter. 2. No early apparent complications.     Hillis Range, MD     JA/MEDQ  D:   04/03/2010  T:  04/03/2010  Job:  [id number]  cc:   Nicki Guadalajara, M.D.  Electronically Signed by Hillis Range MD on 04/12/2010 10:22:08 PM

## 2010-04-13 ENCOUNTER — Institutional Professional Consult (permissible substitution): Payer: Self-pay | Admitting: Internal Medicine

## 2010-05-02 ENCOUNTER — Encounter: Payer: Self-pay | Admitting: Internal Medicine

## 2010-05-03 ENCOUNTER — Encounter: Payer: Self-pay | Admitting: Internal Medicine

## 2010-05-03 ENCOUNTER — Encounter (INDEPENDENT_AMBULATORY_CARE_PROVIDER_SITE_OTHER): Payer: Medicare Other | Admitting: Internal Medicine

## 2010-05-03 ENCOUNTER — Telehealth (INDEPENDENT_AMBULATORY_CARE_PROVIDER_SITE_OTHER): Payer: Self-pay | Admitting: *Deleted

## 2010-05-03 DIAGNOSIS — I4892 Unspecified atrial flutter: Secondary | ICD-10-CM

## 2010-05-03 DIAGNOSIS — I4891 Unspecified atrial fibrillation: Secondary | ICD-10-CM

## 2010-05-05 ENCOUNTER — Other Ambulatory Visit: Payer: Self-pay | Admitting: Internal Medicine

## 2010-05-05 ENCOUNTER — Ambulatory Visit (INDEPENDENT_AMBULATORY_CARE_PROVIDER_SITE_OTHER): Payer: Medicare Other | Admitting: Internal Medicine

## 2010-05-05 ENCOUNTER — Ambulatory Visit (INDEPENDENT_AMBULATORY_CARE_PROVIDER_SITE_OTHER)
Admission: RE | Admit: 2010-05-05 | Discharge: 2010-05-05 | Disposition: A | Discharge status: Home / Self Care | Payer: Medicare Other | Source: Ambulatory Visit | Attending: Internal Medicine | Admitting: Internal Medicine

## 2010-05-05 ENCOUNTER — Encounter: Payer: Self-pay | Admitting: Internal Medicine

## 2010-05-05 ENCOUNTER — Other Ambulatory Visit: Payer: Medicare Other

## 2010-05-05 DIAGNOSIS — J449 Chronic obstructive pulmonary disease, unspecified: Secondary | ICD-10-CM | POA: Insufficient documentation

## 2010-05-05 DIAGNOSIS — R0989 Other specified symptoms and signs involving the circulatory and respiratory systems: Secondary | ICD-10-CM

## 2010-05-05 DIAGNOSIS — R0609 Other forms of dyspnea: Secondary | ICD-10-CM

## 2010-05-05 LAB — BRAIN NATRIURETIC PEPTIDE: Pro B Natriuretic peptide (BNP): 541 pg/mL — ABNORMAL HIGH (ref 0.0–100.0)

## 2010-05-05 LAB — TSH: TSH: 2.24 u[IU]/mL (ref 0.35–5.50)

## 2010-05-07 ENCOUNTER — Encounter: Payer: Self-pay | Admitting: Internal Medicine

## 2010-05-09 NOTE — Assessment & Plan Note (Signed)
Summary: eph. per dr. [name]. gd      Allergies Added:   Visit Type:  eph Referring Provider:  Dr Tresa Endo Primary Provider:  Rudi Heap, MD  CC:  fatigued and headaches.  History of Present Illness: Joshua [name] is a pleasant 65 yo WM with a h/o hypertrophic cardiomyopathy, atrial fibrillation, and recently discovered atrial flutter s/p cardioversion by me 04/03/10 who presents today for follow-up.  Previously, he has not required antiarrhythmic drug therapy.  He initially developed afib in 2007.  He  is unaware of any further episodes of atrial fibrillation.  12/11, he develped abrupt fatigue and decreased exercise tolerance with progressive BLE edema.  He was evaluated by Dr Tresa Endo and found to have typical appearing atrial flutter.  He was started on metoprolol for rate control.  He reports mild improvement with metoprolol.  I saw him in the office and offered cardioversion vs atrial flutter ablation.  He requested cardioversion and therefore cardioversion was performed by me 04/03/10.  He reports feeling well since his cardioversion.  He feels that his palpitations and fatigue improved immediately post cardioversion.  He thinks that he is likely in sinus rhythm today, though EKG reveals afib. He reports nasal congestion and cough, worse in the morning.  He feels that this has been present close to a month.  He checks his O2 sats at home and finds that in the morning, they are frequently in the 80s but improve as the morning goes on.  He states that he has seen Dr Maple Hudson previously and requests follow-up with Dr Maple Hudson.  He has a h/o rheumatic and typhoid fever as a child.    Current Medications (verified): 1)  Verapamil Hcl Cr 240 Mg Xr24h-Cap (Verapamil Hcl) .... Take One By Mouth Once Daily 2)  Aspirin 81 Mg Tbec (Aspirin) .... Take One By Mouth Once Daily 3)  Lasix 20 Mg Tabs (Furosemide) .Marland Kitchen.. 1 Tab Once Daily 4)  Klor-Con 10 10 Meq Cr-Tabs (Potassium Chloride) .Marland Kitchen.. 1 Tablet By Mouth Once  Daily 5)  Coumadin 7.5 Mg Tabs (Warfarin Sodium) .... Take As Directed  Tues and Thurs 6)  Crestor 10 Mg Tabs (Rosuvastatin Calcium) .... Take One By Mouth At Bedtime 7)  Omeprazole 20 Mg Cpdr (Omeprazole) .Marland Kitchen.. 1 Capsule By Mouth Once Daily 8)  Coumadin 5 Mg Tabs (Warfarin Sodium) .... Take Mon, Wed, Fri, Sat and Sun 9)  Vitamin D3 2000 Unit Tabs (Cholecalciferol) .Marland Kitchen.. 1 By Mouth Two Times A Day  Allergies (verified): 1)  ! Tetracycline  Past History:  Past Medical History: FIBRILLATION, ATRIAL (ICD-427.31) Typical atrial flutter s/p [facility] 04/03/10 CORONARY ARTERY DISEASE (ICD-414.00) s/p PCI HYPERCHOLESTEROLEMIA  Hypertrophic CM ANEMIA, IRON DEFICIENCY (ICD-280.9) B12 DEFICIENCY (ICD-266.2) COLONIC POLYPS, ADENOMATOUS, HX OF (ICD-V12.72) GASTRITIS (ICD-535.50) HIATAL HERNIA WITH REFLUX (ICD-553.3) h/o rheumatic fever/ typhoid fever poor hearing DJD  Past Surgical History: Reviewed history from 03/31/2010 and no changes required. angioplasty 2002 and 2003 shoulder surgery  Family History: Reviewed history from 03/31/2010 and no changes required. Family History of Colon Cancer: paternal grandfather mother had an "enlarged heart" brother died suddenly age 74s following a prolonged episode of chest pain, presumed to be due to MI.  Autopsy per pt revealed Ventricular rupture.  Pt is not sure that he had HCM.  Social History: Reviewed history from 03/31/2010 and no changes required. Pt lives in [place]. Occupation: retired from Patent examiner Married with two sons and three daughters Patient briefly smoked in service. Alcohol Use - no Illicit Drug  Use - no  Review of Systems       All systems are reviewed and negative except as listed in the HPI.   Vital Signs:  Patient profile:   65 year old male Height:      69.5 inches Weight:      209 pounds BMI:     30.53 Pulse rate:   71 / minute BP sitting:   129 / 60  (left arm) Cuff size:   regular  Vitals  Entered By: Caralee Ates CMA (May 03, 2010 9:57 AM)  Physical Exam  General:  Well developed, well nourished, in no acute distress. Head:  normocephalic and atraumatic Eyes:  PERRLA/EOM intact; conjunctiva and lids normal. Mouth:  Teeth, gums and palate normal. Oral mucosa normal. Neck:  supple Lungs:  Clear bilaterally to auscultation and percussion. Heart:  iRRR, 3/6 SEM LUSB Abdomen:  Bowel sounds positive; abdomen soft and non-tender without masses, organomegaly, or hernias noted. No hepatosplenomegaly. Msk:  Back normal, normal gait. Muscle strength and tone normal. Extremities:  1+ BLE edema Neurologic:  Alert and oriented x 3. Skin:  Intact without lesions or rashes. Psych:  Normal affect.   EKG  Procedure date:  05/03/2010  Findings:      atrial fibrillaiton, V rate 56, LVH with repolarization abnormality  Impression & Recommendations:  Problem # 1:  FIBRILLATION, ATRIAL (ICD-427.31) The patient has returned to atrial fibrillation but does not appear to be overtly sypmtomatic with rate control.  He was much more symptomatic previously with atrial flutter.  At this point, I think that we should see how he does with rate control.  Given his hypertrophic cardiomyopathy, his only medicine options are norpace and amiodarone.  I would be reluctant to start norpace as an outpatient.  We will continue coumadin. I will see him back in 6 weeks and then decide whether to initiate an AAD or continue rate control longterm.  I will discuss with Dr Tresa Endo in the interim.  Problem # 2:  ATRIAL FLUTTER (ICD-427.32) as above  Problem # 3:  HYPERTROPHIC OBSTRUCTIVE CARDIOMYOPATHY (ICD-425.11) stable continue current medicine therapy  Problem # 4:  CORONARY ARTERY DISEASE (ICD-414.00) no ischemic symptoms

## 2010-05-09 NOTE — Progress Notes (Signed)
Summary: appt with dr young before 4/20- sched to see MW for consult  Phone Note Call from Patient Call back at Home Phone [phone]   Caller: Patient Call For: YOUNG Summary of Call: patient came in and stated that he saw Dr [name] today and he told him that he needed to schedule an appointment with Dr Thom Chimes he has had a cold for a month or more and Dr [name] thinks that it is allergies. Patient last saw Dr Maple Hudson in 2007. The first avaiable is 06/16/2010 and patient would like to be seen sooner. Patient will be in [facility] tomorrow. He can be reached at [phone] Initial call taken by: Vedia Coffer,  May 03, 2010 11:05 AM  Follow-up for Phone Call        need to find out how symtomatic he is- [facility] x 1 Vernie Murders  May 03, 2010 11:10 AM  Spoke with pt.  He states that he has had a "cold" on and off for the past month or so- c/o chest congestion and cough in the am, prod with clear sputum.  He states that he checks his o2 sats in the am and they are usually in the mid 80's to low 90's.  Would like to be seen sooner by [facility] than April.  Pls advise thanks Follow-up by: Vernie Murders,  May 03, 2010 12:05 PM  Additional Follow-up for Phone Call Additional follow up Details #1::        Please let patient know that at this time we are switching to a new computer system and the first avaliable appt is in April-pt can call and ck for any cancelled appts in the meantime. [facility] aware of this.Reynaldo Minium CMA  May 03, 2010 12:29 PM   [facility] -- Zack Seal has had a cancellation on march 16.  ? if this would be ok for pt if still available when he calls back. Additional Follow-up by: Gweneth Dimitri RN,  May 03, 2010 12:35 PM    Additional Follow-up for Phone Call Additional follow up Details #2::    pt returned call from triage. note: 3/16 date no longer avail for consult slot. Tivis Ringer, CNA  May 03, 2010 1:12 PM  Spoke with pt and notified no openings with CD[facility] until April.  He states that he can not wait that long and requests to see another pulm doc.  Appt with MW sched for 05/05/10 at 11 am. I advised that he arrive 15 min prior to this time and bring all active meds in hand. Pt verbalized understanding.  Follow-up by: Vernie Murders,  May 03, 2010 1:57 PM

## 2010-05-16 NOTE — Assessment & Plan Note (Signed)
Summary: Pulmonary/ new pt eval with cough/ sob > try symbicort   Visit Type:  Initial Consult Copy to:  Self Primary Provider/Referring Provider:  Rudi Heap, MD  CC:  Dyspnea.  History of Present Illness: 86 yowm quit smoking around 1990 with "some breathing problems"  saw Dr Maple Hudson and breathing problem resolved to point where could all but the most strenous yardwork s need for any resp meds at all as a baseline.  May 05, 2010 cc sob much worse since cold around Christmas 2011 with runny nose clear rec use clariton some better drainage but  no better breathing saw cardiology cardioverted from flutter back to afib but no improvment in breathing.  no purulent sputum.  worse when tried to lie down at night.    Pt denies any significant sore throat, dysphagia, itching, sneezing ,  fever, chills, sweats, unintended wt loss, pleuritic or exertional cp, hempoptysis, change in activity tolerance  orthopnea pnd or leg swelling Pt also denies any obvious fluctuation in symptoms with weather or environmental change or other alleviating or aggravating factors.       Current Medications (verified): 1)  Verapamil Hcl Cr 240 Mg Xr24h-Cap (Verapamil Hcl) .... Take One By Mouth Once Daily 2)  Aspirin 81 Mg Tbec (Aspirin) .... Take One By Mouth Once Daily 3)  Lasix 20 Mg Tabs (Furosemide) .Marland Kitchen.. 1 Tab Once Daily 4)  Klor-Con 10 10 Meq Cr-Tabs (Potassium Chloride) .Marland Kitchen.. 1 Tablet By Mouth Once Daily 5)  Crestor 10 Mg Tabs (Rosuvastatin Calcium) .... Take One By Mouth At Bedtime 6)  Omeprazole 20 Mg Cpdr (Omeprazole) .Marland Kitchen.. 1 Capsule By Mouth Once Daily 7)  Coumadin 5 Mg Tabs (Warfarin Sodium) .... Take As Directed Per Dr. Vernon Prey 8)  Vitamin D3 2000 Unit Tabs (Cholecalciferol) .Marland Kitchen.. 1 By Mouth Two Times A Day 9)  Tylenol 325 Mg Tabs (Acetaminophen) .... Per Bottle Directions As Needed  Allergies (verified): 1)  ! Tetracycline  Past History:  Past Medical History: FIBRILLATION, ATRIAL  (ICD-427.31) Typical atrial flutter s/p [facility] 04/03/10 CORONARY ARTERY DISEASE (ICD-414.00) s/p PCI HYPERCHOLESTEROLEMIA  Hypertrophic CM ANEMIA, IRON DEFICIENCY (ICD-280.9) B12 DEFICIENCY (ICD-266.2) COLONIC POLYPS, ADENOMATOUS, HX OF (ICD-V12.72) GASTRITIS (ICD-535.50) HIATAL HERNIA WITH REFLUX (ICD-553.3) h/o rheumatic fever/ typhoid fever poor hearing DJD COPD.....................................Marland KitchenWert     - PFT's May 05, 2010  FEV1 .9 (26%) ratio 60   Family History: Family History of Colon Cancer: paternal grandfather mother had an "enlarged heart" brother died suddenly age 55s following a prolonged episode of chest pain, presumed to be due to MI.  Autopsy per pt revealed Ventricular rupture.  Pt is not sure that he had HCM. Emphysema- Brother  Lung CA- Brother (was a smoker)  Social History: Pt lives in [place]. Occupation: retired from Patent examiner Married with two sons and three daughters Former smoker.  Quit in 1987.  Smoked approx 10 yrs @ 1ppd Alcohol Use - no Illicit Drug Use - no  Review of Systems       The patient complains of shortness of breath with activity, shortness of breath at rest, productive cough, non-productive cough, coughing up blood, hand/feet swelling, and change in color of mucus.  The patient denies chest pain, irregular heartbeats, acid heartburn, indigestion, loss of appetite, weight change, abdominal pain, difficulty swallowing, sore throat, tooth/dental problems, headaches, nasal congestion/difficulty breathing through nose, sneezing, itching, ear ache, anxiety, depression, joint stiffness or pain, rash, and fever.    Vital Signs:  Patient profile:   65  year old male Weight:      210 pounds BMI:     30.68 O2 Sat:      92 % on Room air Temp:     97.7 degrees F oral Pulse rate:   74 / minute BP sitting:   128 / 78  (left arm)  Vitals Entered By: Vernie Murders (May 05, 2010 11:20 AM)  O2 Flow:  Room air  Serial Vital  Signs/Assessments:  Comments: 12:12 PM Ambulatory Pulse Oximetry  Resting; HR__67___    02 Sat__93%ra___  Lap1 (185 feet)   HR__95___   02 Sat__90%ra___ Lap2 (185 feet)   HR_____   02 Sat_____    Lap3 (185 feet)   HR_____   02 Sat_____  ___Test Completed without Difficulty _x__Test Stopped due to: pt c/o SOB and leg pain   By: Vernie Murders    Physical Exam  Additional Exam:  wt 209 > 210 May 07, 2010  amb anxious wm nad  HEENT mild turbinate edema.  Oropharynx no thrush or excess pnd or cobblestoning.  No JVD or cervical adenopathy. Mild accessory muscle hypertrophy. Trachea midline, nl thryroid. Chest was hyperinflated by percussion with diminished breath sounds and moderate increased exp time without wheeze. Hoover sign positive at mid inspiration. Regular rate and rhythm without murmur gallop or rub or increase P2 or edema.  Abd: no hsm, nl excursion. Ext warm without cyanosis or clubbing.     FastTSH                   2.24 uIU/mL                 0.35-5.50  Tests: (2) B-Type Natiuretic Peptide (BNPR)  B-Type Natriuetic Peptide                        [H]  541.0 pg/mL                 0.0-100.0  CXR  Procedure date:  05/05/2010  Findings:      No significant change.  Stable bilateral chronic blunting of the costophrenic angle.  Stable right pleural effusion with probable chronic right basilar atelectasis, scarring or chronic infiltrate. Stable scarring and chronic blunting of the left costophrenic angle.  Impression & Recommendations:  Problem # 1:  DYSPNEA (ICD-786.09)  His updated medication list for this problem includes:    Lasix 20 Mg Tabs (Furosemide) .Marland Kitchen... 2 each am    Symbicort 160-4.5 Mcg/act Aero (Budesonide-formoterol fumarate) .Marland Kitchen... 2 puffs first thing  in am and 2 puffs again in pm about 12 hours later  Multifactorial with components of chf and copd       Problem # 2:  COPD UNSPECIFIED (ICD-496) When respiratory symptoms begin well after a  patient reports complete smoking cessation,  it is very hard to "blame" COPD  ie it doesn't make any more sense than hearing a  NASCAR driver wrecked his car while driving his kids to school or a Careers adviser sliced his hand off carving [place].  Once the high risk activity stops,  the symptoms should not suddenly erupt.  If so, the differential diagnosis should include  obesity/deconditioning,  LPR/Reflux, CHF, or side effect of medications     DDX of  difficult airways managment all start with A and  include Adherence, Ace Inhibitors, Acid Reflux, Active Sinus Disease, Alpha 1 Antitripsin deficiency, Anxiety masquerading as Airways dz,  ABPA,  allergy(esp in young), Aspiration (esp in  elderly), Adverse effects of DPI,  Active smokers, plus two Bs  = Bronchiectasis and Beta blocker use..and one C= CHF    Adherence: I spent extra time with the patient today explaining optimal mdi  technique.  This improved from  50-75% p coaching  ? acid reflux:  reviewd diet  ? CHF double lasix dose.  Medications Added to Medication List This Visit: 1)  Lasix 20 Mg Tabs (Furosemide) .... 2 each am 2)  Omeprazole 20 Mg Cpdr (Omeprazole) .... Take  one 30-60 min before first meal of the day 3)  Coumadin 5 Mg Tabs (Warfarin sodium) .... Take as directed per dr. Roe Coombs moore 4)  Tylenol 325 Mg Tabs (Acetaminophen) .... Per bottle directions as needed 5)  Pepcid 20 Mg Tabs (Famotidine) .... Take one by mouth at bedtime 6)  Symbicort 160-4.5 Mcg/act Aero (Budesonide-formoterol fumarate) .... 2 puffs first thing  in am and 2 puffs again in pm about 12 hours later  Other Orders: T-2 View CXR (71020TC) TLB-TSH (Thyroid Stimulating Hormone) (84443-TSH) TLB-BNP (B-Natriuretic Peptide) (83880-BNPR) New Patient Level V (78469) Pulse Oximetry, Ambulatory (62952)  Patient Instructions: 1)  continue omeprazole 20 mg Take  one 30-60 min before first meal of the day and add pepcid 20 mg at bedtime for now 2)  Increase lasix to  two daily 3)  we will call you with your cxr report 4)  try chlortrimeton 4 mg one every 6 hours for drainage 5)  Symbiocort 160 2 puffs first thing  in am and 2 puffs again in pm about 12 hours later  6)  Work on inhaler technique:  relax and blow all the way out then take a nice smooth deep breath back in, triggering the inhaler at same time you start breathing in  7)  GERD (REFLUX)  is a common cause of respiratory symptoms. It commonly presents without heartburn and can be treated with medication, but also with lifestyle changes including avoidance of late meals, excessive alcohol, smoking cessation, and avoid fatty foods, chocolate, peppermint, colas, red wine, and acidic juices such as orange juice. NO MINT OR MENTHOL PRODUCTS SO NO COUGH DROPS  8)  USE SUGARLESS CANDY INSTEAD (jolley ranchers)  9)  NO OIL BASED VITAMINS  10)  Please schedule a follow-up appointment in 4 weeks, sooner if needed    Immunization History:  Influenza Immunization History:    Influenza:  historical (10/27/2009)

## 2010-05-31 ENCOUNTER — Encounter: Payer: Self-pay | Admitting: Internal Medicine

## 2010-06-01 ENCOUNTER — Other Ambulatory Visit: Payer: Self-pay | Admitting: Family Medicine

## 2010-06-01 ENCOUNTER — Ambulatory Visit
Admission: RE | Admit: 2010-06-01 | Discharge: 2010-06-01 | Disposition: A | Discharge status: Home / Self Care | Payer: Medicare Other | Source: Ambulatory Visit | Attending: Family Medicine | Admitting: Family Medicine

## 2010-06-01 DIAGNOSIS — R609 Edema, unspecified: Secondary | ICD-10-CM

## 2010-06-01 DIAGNOSIS — R52 Pain, unspecified: Secondary | ICD-10-CM

## 2010-06-05 ENCOUNTER — Ambulatory Visit (INDEPENDENT_AMBULATORY_CARE_PROVIDER_SITE_OTHER): Payer: Medicare Other | Admitting: Internal Medicine

## 2010-06-05 ENCOUNTER — Other Ambulatory Visit (INDEPENDENT_AMBULATORY_CARE_PROVIDER_SITE_OTHER): Payer: Medicare Other

## 2010-06-05 ENCOUNTER — Encounter: Payer: Self-pay | Admitting: Internal Medicine

## 2010-06-05 VITALS — BP 120/70 | HR 68 | Temp 97.6°F | Ht 68.0 in | Wt 209.0 lb

## 2010-06-05 DIAGNOSIS — R0609 Other forms of dyspnea: Secondary | ICD-10-CM

## 2010-06-05 DIAGNOSIS — E7800 Pure hypercholesterolemia, unspecified: Secondary | ICD-10-CM

## 2010-06-05 DIAGNOSIS — R0989 Other specified symptoms and signs involving the circulatory and respiratory systems: Secondary | ICD-10-CM

## 2010-06-05 DIAGNOSIS — J449 Chronic obstructive pulmonary disease, unspecified: Secondary | ICD-10-CM

## 2010-06-05 LAB — BASIC METABOLIC PANEL
BUN: 23 mg/dL (ref 6–23)
CO2: 32 mEq/L (ref 19–32)
Calcium: 8.2 mg/dL — ABNORMAL LOW (ref 8.4–10.5)
Chloride: 96 mEq/L (ref 96–112)
Creatinine, Ser: 1.2 mg/dL (ref 0.4–1.5)
GFR: 65.07 mL/min (ref >60.00)
Glucose, Bld: 91 mg/dL (ref 70–99)
Potassium: 3.8 mEq/L (ref 3.5–5.1)
Sodium: 135 mEq/L (ref 135–145)

## 2010-06-05 MED ORDER — MOMETASONE FURO-FORMOTEROL FUM 100-5 MCG/ACT IN AERO: INHALATION_SPRAY | RESPIRATORY_TRACT | Status: DC

## 2010-06-05 NOTE — Patient Instructions (Addendum)
Please see patient coordinator before you leave today  to schedule CT chest to eval the low 02  Try dulera 100 one puff twice daily   Work on inhaler technique:  relax and gently blow all the way out then take a nice smooth deep breath back in, triggering the inhaler at same time you start breathing in.  Hold for up to 5 seconds if you can.  Rinse and gargle with water when done   If your mouth or throat starts to bother you,   I suggest you time the inhaler to your dental care and after using the inhaler(s) brush teeth and tongue with a baking soda containing toothpaste and when you rinse this out, gargle with it first to see if this helps your mouth and throat.     GERD (REFLUX)  is an extremely common cause of respiratory symptoms, many times with no significant heartburn at all.    It can be treated with medication, but also with lifestyle changes including avoidance of late meals, excessive alcohol, smoking cessation, and avoid fatty foods, chocolate, peppermint, colas, red wine, and acidic juices such as orange juice.  NO MINT OR MENTHOL PRODUCTS SO NO COUGH DROPS  USE SUGARLESS CANDY INSTEAD (jolley ranchers or Stover's)  NO OIL BASED VITAMINS   Please schedule a follow up office visit in 6 weeks, call sooner if needed with PFT's on return

## 2010-06-05 NOTE — Progress Notes (Signed)
Subjective:    Patient ID: Joshua [name], male    DOB: 09/08/1945, 65 y.o.   MRN: [id number]  HPI  88 yowm quit smoking around 1990 with "some breathing problems" saw Dr Maple Hudson and breathing problem resolved to point where could all but the most strenous yardwork s need for any resp meds at all as a baseline.   May 05, 2010 cc sob much worse since cold around Christmas 2011 with runny nose clear rec use clariton some better drainage but no better breathing saw cardiology cardioverted from flutter back to afib but no improvment in breathing. no purulent sputum. worse when tried to lie down at night.  Imp was  Copd plus chf. rec 1) continue omeprazole 20 mg Take one 30-60 min before first meal of the day and add pepcid 20 mg at bedtime for now  2) Increase lasix to two daily  3) we will call you with your cxr report  4) try chlortrimeton 4 mg one every 6 hours for drainage  5) Symbiocort 160 2 puffs first thing in am and 2 puffs again in pm about 12 hours later  6) Work on inhaler technique: relax and blow all the way out then take a nice smooth deep breath back in, triggering the inhaler at same time you start breathing in  7) GERD (REFLUX)  06/05/2010 ov minimal improvement on rx  With marked hoarsenss on symbicort so stopped it.    Pt denies any significant sore throat, dysphagia, itching, sneezing,  nasal congestion or excess/ purulent secretions,  fever, chills, sweats, unintended wt loss, pleuritic or exertional cp, hempoptysis, orthopnea pnd or leg swelling.    Also denies any obvious fluctuation of symptoms with weather or environmental changes or other aggravating or alleviating factors.  s  Past Medical History:  FIBRILLATION, ATRIAL (ICD-427.31)  Typical atrial flutter s/p [facility] 04/03/10  CORONARY ARTERY DISEASE (ICD-414.00) s/p PCI  HYPERCHOLESTEROLEMIA  Hypertrophic CM  ANEMIA, IRON DEFICIENCY (ICD-280.9)  B12 DEFICIENCY (ICD-266.2)  COLONIC POLYPS, ADENOMATOUS, HX OF (ICD-V12.72)    GASTRITIS (ICD-535.50)  HIATAL HERNIA WITH REFLUX (ICD-553.3)  h/o rheumatic fever/ typhoid fever  poor hearing  DJD  COPD.....................................Marland KitchenWert  - PFT's May 05, 2010 FEV1 .9 (26%) ratio 60  - HFA 75% p coaching 06/05/2010   Family History:  Family History of Colon Cancer: paternal grandfather  mother had an "enlarged heart"  brother died suddenly age 47s following a prolonged episode of chest pain, presumed to be due to MI. Autopsy per pt revealed Ventricular rupture. Pt is not sure that he had HCM.  Emphysema- Brother  Lung CA- Brother (was a smoker)   Social History:  Pt lives in [place].  Occupation: retired from Patent examiner  Married with two sons and three daughters  Former smoker. Quit in 1987. Smoked approx 10 yrs @ 1ppd  Alcohol Use - no  Illicit Drug Use - no             Review of Systems     Objective:   Physical Exam     wt 209 > 210 May 07, 2010 >  209 06/05/2010  amb anxious wm nad  HEENT mild turbinate edema. Oropharynx no thrush or excess pnd or cobblestoning. No JVD or cervical adenopathy. Mild accessory muscle hypertrophy. Trachea midline, nl thryroid. Chest was hyperinflated by percussion with diminished breath sounds and moderate increased exp time without wheeze. Hoover sign positive at mid inspiration. Regular rate and rhythm without murmur gallop or rub  or increase P2 or edema. Abd: no hsm, nl excursion. Ext warm without cyanosis or clubbing.     Assessment & Plan:

## 2010-06-05 NOTE — Assessment & Plan Note (Addendum)
DDX of  difficult airways managment all start with A and  include Adherence, Ace Inhibitors, Acid Reflux, Active Sinus Disease, Alpha 1 Antitripsin deficiency, Anxiety masquerading as Airways dz,  ABPA,  allergy(esp in young), Aspiration (esp in elderly), Adverse effects of DPI,  Active smokers, plus two Bs  = Bronchiectasis and Beta blocker use..and one C= CHF   Clearly has chf component but also concerned about Acid reflux, aspiration, Bronchiectasis here > CT chest  COPD present but only moderate by initial pft's at screening  Each maintenance medication was reviewed in detail including most importantly the difference between maintenance and as needed and under what circumstances the prns are to be used.  Please see instructions for details which were reviewed in writing and the patient given a copy.   The proper method of use, as well as anticipated side effects, of this metered-dose inhaler are discussed and demonstrated to the patient. This improved to 75%

## 2010-06-06 ENCOUNTER — Ambulatory Visit (INDEPENDENT_AMBULATORY_CARE_PROVIDER_SITE_OTHER)
Admission: RE | Admit: 2010-06-06 | Discharge: 2010-06-06 | Disposition: A | Discharge status: Home / Self Care | Payer: Medicare Other | Source: Ambulatory Visit | Attending: Internal Medicine | Admitting: Internal Medicine

## 2010-06-06 DIAGNOSIS — R0609 Other forms of dyspnea: Secondary | ICD-10-CM

## 2010-06-06 DIAGNOSIS — R0989 Other specified symptoms and signs involving the circulatory and respiratory systems: Secondary | ICD-10-CM

## 2010-06-06 HISTORY — DX: Essential (primary) hypertension: I10

## 2010-06-06 MED ORDER — IOHEXOL 300 MG/ML  SOLN
80.0000 mL | Freq: Once | INTRAMUSCULAR | Status: AC | PRN
  Administered 2010-06-06: 80 mL via INTRAVENOUS

## 2010-06-06 NOTE — Progress Notes (Signed)
Quick Note:  Spoke with pt and notified of results per Dr. Wert. Pt verbalized understanding and denied any questions.  ______ 

## 2010-06-07 LAB — PROTIME-INR
INR: 2.7 — ABNORMAL HIGH (ref 0.00–1.49)
Prothrombin Time: 31 seconds — ABNORMAL HIGH (ref 11.6–15.2)

## 2010-06-07 LAB — DIFFERENTIAL
Basophils Absolute: 0 10*3/uL (ref 0.0–0.1)
Basophils Relative: 0 % (ref 0–1)
Eosinophils Absolute: 0.1 10*3/uL (ref 0.0–0.7)
Eosinophils Relative: 3 % (ref 0–5)
Lymphocytes Relative: 24 % (ref 12–46)
Lymphs Abs: 1 10*3/uL (ref 0.7–4.0)
Monocytes Absolute: 0.4 10*3/uL (ref 0.1–1.0)
Monocytes Relative: 9 % (ref 3–12)
Neutro Abs: 2.6 10*3/uL (ref 1.7–7.7)
Neutrophils Relative %: 64 % (ref 43–77)

## 2010-06-07 LAB — CBC
HCT: 41.5 % (ref 39.0–52.0)
Hemoglobin: 14.4 g/dL (ref 13.0–17.0)
MCHC: 34.6 g/dL (ref 30.0–36.0)
MCV: 92.8 fL (ref 78.0–100.0)
Platelets: 142 10*3/uL — ABNORMAL LOW (ref 150–400)
RBC: 4.47 MIL/uL (ref 4.22–5.81)
RDW: 13.6 % (ref 11.5–15.5)
WBC: 4.1 10*3/uL (ref 4.0–10.5)

## 2010-06-07 LAB — BASIC METABOLIC PANEL
BUN: 19 mg/dL (ref 6–23)
CO2: 28 mEq/L (ref 19–32)
Calcium: 8.6 mg/dL (ref 8.4–10.5)
Chloride: 103 mEq/L (ref 96–112)
Creatinine, Ser: 0.8 mg/dL (ref 0.4–1.5)
GFR calc Af Amer: >60 mL/min (ref >60)
GFR calc non Af Amer: >60 mL/min (ref >60)
Glucose, Bld: 131 mg/dL — ABNORMAL HIGH (ref 70–99)
Potassium: 3.9 mEq/L (ref 3.5–5.1)
Sodium: 137 mEq/L (ref 135–145)

## 2010-06-07 NOTE — Progress Notes (Signed)
Quick Note:  Spoke with pt and notified of results per Dr. Wert. Pt verbalized understanding and denied any questions.  ______ 

## 2010-06-15 ENCOUNTER — Ambulatory Visit: Payer: Medicare Other | Admitting: Internal Medicine

## 2010-06-20 ENCOUNTER — Encounter: Payer: Self-pay | Admitting: Internal Medicine

## 2010-06-21 ENCOUNTER — Ambulatory Visit: Payer: Medicare Other | Admitting: Internal Medicine

## 2010-06-26 ENCOUNTER — Ambulatory Visit (INDEPENDENT_AMBULATORY_CARE_PROVIDER_SITE_OTHER): Payer: Medicare Other | Admitting: Internal Medicine

## 2010-06-26 ENCOUNTER — Encounter: Payer: Self-pay | Admitting: Internal Medicine

## 2010-06-26 DIAGNOSIS — I4891 Unspecified atrial fibrillation: Secondary | ICD-10-CM

## 2010-06-26 DIAGNOSIS — I421 Obstructive hypertrophic cardiomyopathy: Secondary | ICD-10-CM

## 2010-06-26 DIAGNOSIS — R0600 Dyspnea, unspecified: Secondary | ICD-10-CM | POA: Insufficient documentation

## 2010-06-26 DIAGNOSIS — R0989 Other specified symptoms and signs involving the circulatory and respiratory systems: Secondary | ICD-10-CM

## 2010-06-26 DIAGNOSIS — R0609 Other forms of dyspnea: Secondary | ICD-10-CM

## 2010-06-26 NOTE — Assessment & Plan Note (Signed)
I agree with Dr Sherene Sires that the patient's SOB is multifactoral.  I am concerned that his atrial fibrillation may be a significant component in his SOB, though he is well rate controlled.  Even with mild obstructive HCM, afib is often poorly tolerated.  I would therefore recommend that we persue sinus rhythm and then reassess.

## 2010-06-26 NOTE — Progress Notes (Signed)
Joshua [name] is a pleasant 65 yo WM with a h/o hypertrophic cardiomyopathy, atrial fibrillation, and recently discovered atrial flutter s/p cardioversion by me 04/03/10 who presents today for follow-up.  Previously, he has not required antiarrhythmic drug therapy.  He initially developed afib in 2007.  He  is unaware of any further episodes of atrial fibrillation.  12/11, he develped abrupt fatigue and decreased exercise tolerance with progressive BLE edema.  He was evaluated by Dr Tresa Endo and found to have typical appearing atrial flutter.  He was started on metoprolol for rate control.  He reports mild improvement with metoprolol.  I saw him in the office and offered cardioversion vs atrial flutter ablation.  He requested cardioversion and therefore cardioversion was performed by me 04/03/10.  He reports feeling well initially following his cardioversion.  He subsequently returned to atrial fibrillation.  He has persistent cough and shortness of breath.  He reports dyspnea with moderate activity.  He was evaluated by Dr Sherene Sires who felt that his symptoms were not entirely explained by lung disease and that CHF may be contributing.  He has a h/o rheumatic and typhoid fever as a child.   Past Medical History  Diagnosis Date  . Atrial fibrillation     persistent  . Coronary artery disease   . Hypercholesteremia   . Hypercholesteremia   . Iron deficiency anemia, unspecified   . Other B-complex deficiencies   . Hx of adenomatous colonic polyps   . Gastritis   . Hiatal hernia   . Hard of hearing   . Rheumatic fever   . COPD (chronic obstructive pulmonary disease)     PFT 05-05-2010, FEV1 .9 (26%) ratio 60  . DJD (degenerative joint disease)   . Hypertension   . Hypertrophic obstructive cardiomyopathy     mild subaortic stenosis   Past Surgical History  Procedure Date  . Coronary angioplasty     2002-2003  . Shoulder surgery     Current Outpatient Prescriptions  Medication Sig Dispense Refill  .  aspirin 81 MG tablet Take 81 mg by mouth daily.        . Cholecalciferol (VITAMIN D3) 2000 UNITS TABS Take by mouth. 1-2 times per day       . famotidine (PEPCID) 10 MG tablet Take 10 mg by mouth at bedtime and may repeat dose one time if needed.        . furosemide (LASIX) 20 MG tablet Take 40 mg by mouth daily.       . Mometasone Furo-Formoterol Fum (DULERA) 100-5 MCG/ACT AERO One puff twice daily      . omeprazole (PRILOSEC) 20 MG capsule Take 20 mg by mouth daily.        . potassium chloride (KLOR-CON) 10 MEQ CR tablet Once daily when takes lasix      . rosuvastatin (CRESTOR) 10 MG tablet Take 10 mg by mouth daily.        . verapamil (CALAN-SR) 240 MG CR tablet Take 240 mg by mouth. 1 tab qd       . warfarin (COUMADIN) 5 MG tablet Take 5 mg by mouth. As directed       . DISCONTD: furosemide (LASIX) 10 MG/ML solution Take by mouth daily.         Allergies  Allergen Reactions  . Tetracycline     History   Social History  . Marital Status: Widowed    Spouse Name: N/A    Number of Children: 5  . Years of  Education: N/A   Occupational History  . retired Patent examiner    Social History Main Topics  . Smoking status: Former Smoker -- 1.0 packs/day for 10 years    Types: Cigarettes    Quit date: 02/26/1985  . Smokeless tobacco: Not on file  . Alcohol Use: No  . Drug Use: No  . Sexually Active: Not on file   Other Topics Concern  . Not on file   Social History Narrative  . No narrative on file    Family History  Problem Relation Age of Onset  . Colon cancer Paternal Grandfather   . Heart attack Brother   . Emphysema Brother   . Lung cancer Brother     ROS- All systems are reviewed and negative except as per the HPI above  Physical Exam: Filed Vitals:   06/26/10 1028  BP: 120/60  Pulse: 64  Height: 5\' 8"  (1.727 m)  Weight: 208 lb (94.348 kg)    GEN- The patient is well appearing, alert and oriented x 3 today.   Head- normocephalic, atraumatic Eyes-   Sclera clear, conjunctiva pink Ears- hearing intact Oropharynx- clear Neck- supple, no JVP Lymph- no cervical lymphadenopathy Lungs- Clear to ausculation bilaterally, normal work of breathing Heart- irregular rate and rhythm, 2/6 SEM LUSB,no rubs or gallops, PMI not laterally displaced GI- soft, NT, ND, + BS Extremities- no clubbing, cyanosis, trace edema MS- no significant deformity or atrophy Skin- no rash or lesion Psych- euthymic mood, full affect Neuro- strength and sensation are intact  EKG- afib, V rate 64 bpm, LVH, otherwise normal ekg  Assessment and Plan:

## 2010-06-26 NOTE — Patient Instructions (Signed)
To be admitted on Monday for Norpace load 07/03/2010

## 2010-06-26 NOTE — Assessment & Plan Note (Signed)
The patient has persistent atrial fibrillation as well as prior atrial flutter.  He is presently rate controlled and appropriately anticoagulated but continues to have significant dyspnea and decreased exercise tolerance which I believe may be due to afib.  I would therefore recommend that we pursue sinus rhythm at this time with an antiarrhythmic medicine.  Given his HCM, his antiarrhythmic options are norpace and amiodarone.  Given his lung disease, I think that we should try to avoid amiodarone at this time. Risks, benefits, and alternatives to admission for initiation of norpace were discussed at length with the patient today.  He wishes to proceed.  We will therefore plan admission at the next available time.  I would start norpace 150mg  TID.  If he does not convert to sinus, he may require repeat cardioversion.

## 2010-06-26 NOTE — Assessment & Plan Note (Signed)
The patients previous echo was reviewed by me today.  This was performed by Dr Tresa Endo and reveals mildly obstructive HCM.  We will admit the patient for initiation of norpace.  Hopefully, this may improve his symptoms.

## 2010-06-27 HISTORY — PX: CARDIOVERSION: SHX1299

## 2010-06-27 HISTORY — PX: ATRIAL FLUTTER ABLATION: SHX5733

## 2010-07-03 ENCOUNTER — Inpatient Hospital Stay (HOSPITAL_COMMUNITY)
Admission: AD | Admit: 2010-07-03 | Discharge: 2010-07-06 | DRG: 310 | Disposition: A | Discharge status: Home / Self Care | Payer: Medicare Other | Source: Ambulatory Visit | Attending: Internal Medicine | Admitting: Internal Medicine

## 2010-07-03 DIAGNOSIS — I4891 Unspecified atrial fibrillation: Principal | ICD-10-CM | POA: Diagnosis present

## 2010-07-03 DIAGNOSIS — K2970 Gastritis, unspecified, without bleeding: Secondary | ICD-10-CM | POA: Diagnosis present

## 2010-07-03 DIAGNOSIS — J4489 Other specified chronic obstructive pulmonary disease: Secondary | ICD-10-CM | POA: Diagnosis present

## 2010-07-03 DIAGNOSIS — I10 Essential (primary) hypertension: Secondary | ICD-10-CM | POA: Diagnosis present

## 2010-07-03 DIAGNOSIS — I2510 Atherosclerotic heart disease of native coronary artery without angina pectoris: Secondary | ICD-10-CM | POA: Diagnosis present

## 2010-07-03 DIAGNOSIS — I421 Obstructive hypertrophic cardiomyopathy: Secondary | ICD-10-CM | POA: Diagnosis present

## 2010-07-03 DIAGNOSIS — Z7901 Long term (current) use of anticoagulants: Secondary | ICD-10-CM

## 2010-07-03 DIAGNOSIS — K449 Diaphragmatic hernia without obstruction or gangrene: Secondary | ICD-10-CM | POA: Diagnosis present

## 2010-07-03 DIAGNOSIS — E7800 Pure hypercholesterolemia, unspecified: Secondary | ICD-10-CM | POA: Diagnosis present

## 2010-07-03 DIAGNOSIS — J449 Chronic obstructive pulmonary disease, unspecified: Secondary | ICD-10-CM | POA: Diagnosis present

## 2010-07-03 DIAGNOSIS — D509 Iron deficiency anemia, unspecified: Secondary | ICD-10-CM | POA: Diagnosis present

## 2010-07-03 DIAGNOSIS — K2990 Gastroduodenitis, unspecified, without bleeding: Secondary | ICD-10-CM | POA: Diagnosis present

## 2010-07-03 DIAGNOSIS — E785 Hyperlipidemia, unspecified: Secondary | ICD-10-CM | POA: Diagnosis present

## 2010-07-03 DIAGNOSIS — M1990 Unspecified osteoarthritis, unspecified site: Secondary | ICD-10-CM | POA: Diagnosis present

## 2010-07-03 DIAGNOSIS — Z7982 Long term (current) use of aspirin: Secondary | ICD-10-CM

## 2010-07-03 LAB — CBC
HCT: 39.2 % (ref 39.0–52.0)
Hemoglobin: 12.2 g/dL — ABNORMAL LOW (ref 13.0–17.0)
MCH: 27.5 pg (ref 26.0–34.0)
MCHC: 31.1 g/dL (ref 30.0–36.0)
MCV: 88.5 fL (ref 78.0–100.0)
Platelets: 148 10*3/uL — ABNORMAL LOW (ref 150–400)
RBC: 4.43 MIL/uL (ref 4.22–5.81)
RDW: 14.3 % (ref 11.5–15.5)
WBC: 5.4 10*3/uL (ref 4.0–10.5)

## 2010-07-03 LAB — BASIC METABOLIC PANEL
BUN: 21 mg/dL (ref 6–23)
CO2: 34 mEq/L — ABNORMAL HIGH (ref 19–32)
Calcium: 9 mg/dL (ref 8.4–10.5)
Chloride: 101 mEq/L (ref 96–112)
Creatinine, Ser: 0.89 mg/dL (ref 0.4–1.5)
GFR calc Af Amer: >60 mL/min (ref >60)
GFR calc non Af Amer: >60 mL/min (ref >60)
Glucose, Bld: 109 mg/dL — ABNORMAL HIGH (ref 70–99)
Potassium: 3.9 mEq/L (ref 3.5–5.1)
Sodium: 140 mEq/L (ref 135–145)

## 2010-07-03 LAB — PROTIME-INR
INR: 2.36 — ABNORMAL HIGH (ref 0.00–1.49)
Prothrombin Time: 25.9 seconds — ABNORMAL HIGH (ref 11.6–15.2)

## 2010-07-03 LAB — MAGNESIUM: Magnesium: 2.3 mg/dL (ref 1.5–2.5)

## 2010-07-04 LAB — BASIC METABOLIC PANEL
BUN: 21 mg/dL (ref 6–23)
CO2: 31 mEq/L (ref 19–32)
Calcium: 8.6 mg/dL (ref 8.4–10.5)
Chloride: 101 mEq/L (ref 96–112)
Creatinine, Ser: 0.89 mg/dL (ref 0.4–1.5)
GFR calc Af Amer: >60 mL/min (ref >60)
GFR calc non Af Amer: >60 mL/min (ref >60)
Glucose, Bld: 77 mg/dL (ref 70–99)
Potassium: 4 mEq/L (ref 3.5–5.1)
Sodium: 138 mEq/L (ref 135–145)

## 2010-07-04 LAB — PROTIME-INR
INR: 2.33 — ABNORMAL HIGH (ref 0.00–1.49)
Prothrombin Time: 25.7 seconds — ABNORMAL HIGH (ref 11.6–15.2)

## 2010-07-05 LAB — BASIC METABOLIC PANEL
BUN: 18 mg/dL (ref 6–23)
CO2: 33 mEq/L — ABNORMAL HIGH (ref 19–32)
Calcium: 9 mg/dL (ref 8.4–10.5)
Chloride: 101 mEq/L (ref 96–112)
Creatinine, Ser: 1.07 mg/dL (ref 0.4–1.5)
GFR calc Af Amer: >60 mL/min (ref >60)
GFR calc non Af Amer: >60 mL/min (ref >60)
Glucose, Bld: 83 mg/dL (ref 70–99)
Potassium: 4.5 mEq/L (ref 3.5–5.1)
Sodium: 139 mEq/L (ref 135–145)

## 2010-07-05 LAB — PROTIME-INR
INR: 2.17 — ABNORMAL HIGH (ref 0.00–1.49)
Prothrombin Time: 24.3 seconds — ABNORMAL HIGH (ref 11.6–15.2)

## 2010-07-06 ENCOUNTER — Telehealth: Payer: Self-pay | Admitting: Internal Medicine

## 2010-07-06 ENCOUNTER — Telehealth: Payer: Self-pay | Admitting: Physician Assistant

## 2010-07-06 LAB — BASIC METABOLIC PANEL
BUN: 20 mg/dL (ref 6–23)
CO2: 33 mEq/L — ABNORMAL HIGH (ref 19–32)
Calcium: 9.1 mg/dL (ref 8.4–10.5)
Chloride: 100 mEq/L (ref 96–112)
Creatinine, Ser: 1.02 mg/dL (ref 0.4–1.5)
GFR calc Af Amer: >60 mL/min (ref >60)
GFR calc non Af Amer: >60 mL/min (ref >60)
Glucose, Bld: 86 mg/dL (ref 70–99)
Potassium: 4.2 mEq/L (ref 3.5–5.1)
Sodium: 138 mEq/L (ref 135–145)

## 2010-07-06 LAB — PROTIME-INR
INR: 2.06 — ABNORMAL HIGH (ref 0.00–1.49)
Prothrombin Time: 23.4 seconds — ABNORMAL HIGH (ref 11.6–15.2)

## 2010-07-06 NOTE — Telephone Encounter (Signed)
Pt d/c today on Norpace 150mg  tid. Pt u/a to find a pharmacy that carries it. Called multiple pharmacies myself, unable to locate it in stock anywhere. Contacted [facility] and they agreed to fill a short-term Rx for pt. Sent it in tube system and called pt to let him know he would need to come back for Rx tonight. Pt agreed.

## 2010-07-06 NOTE — Telephone Encounter (Signed)
Pt states pt having trouble getting this med disopyramide fill in the pharmacy pt states he has gone to seven different pharmacy and they can not get it. Pt would like to talk to dr allred nurse.

## 2010-07-06 NOTE — Telephone Encounter (Signed)
Left 3 messages for patient explained to him that I am off tomorrow and to ask for Kennon Rounds because she knows what is going on with this patient.   Called and discussed this with Dr [name] about patient not being able to get his medication.  Also called Weston Brass, Pharm D  She was going to call the pharmacy at [facility] and see what she can do.  Dr [name] said if we can not find the Norpace 150mg  tid  he can have Norpace CR 150mg  bid.  I have tried to call patient and have left him 3 messages with the information.  I have called [facility] in [place] to find out exactly what is going on.  If the medication has to be ordered or what.  Kennon Rounds called me back and said that someone at [facility] had taken care of it and there was enough medication there to get him through [facility].  I left a fourth message for patient that he will need to drop the RX off to have it ordered if he wants the medications by Tuesday

## 2010-07-11 NOTE — Assessment & Plan Note (Signed)
[facility]                         GASTROENTEROLOGY OFFICE NOTE   NAME:Joshua [name], Joshua [name]                        MRN:          [id number]  DATE:06/10/2007                            DOB:          [birth year]    Lorenza returns today and is asymptomatic.  He denies any GI complaints.  He is taking Tandem iron replacement therapy and Nexium 40 mg a day.  As  part of his workup, he was also found to have B12 deficiency and was  begun on B12 hormone replacement therapy.   Lab data was otherwise unremarkable with a hemoglobin of 8.3.  His iron  saturation was only 3.8%.  There has been some question of the fact as  to whether or not he has occult cirrhosis, but this has never been  proven, although he does have chronic elevated ammonia levels.   He has had no mental status changes.  His last colonoscopy and endoscopy  were in April of this year and showed no evidence of varices.   PHYSICAL EXAMINATION:  He is awake and alert in no acute distress,  oriented x3.  There is no asterixis or stigmata of chronic liver  disease.  He weighs 200 pounds.  Blood pressure 120/64.  Pulse was 56 and regular.  I could not appreciate hepatosplenomegaly, abdominal masses, tenderness,  or ascites.   ASSESSMENT:  1. Recent gastrointestinal blood loss of unknown site in a 65 year old      white male, chronically on aspirin and Coumadin  because of his      cardiovascular difficulties, which include hypertrophic      cardiomyopathy, coronary artery disease, and previous coronary      stents.  2. Questionable occult cirrhosis of unexplained etiology.  3. Chronic nonsteroidal gastritis on proton pump inhibitor therapy.  4. Idiopathic B12 deficiency.  5. Iron deficiency related to recent gastrointestinal blood loss, felt      secondary to probable combination of aspirin and Coumadin.   RECOMMENDATIONS:  1. Continue Tandem daily.  Will repeat CBC and iron studies.  2. Check serum  ammonia level.  3. Continue daily Nexium therapy.  4. B12 replacement therapy, then nasal B12 weekly.  5. GI followup p.r.n. as needed.     Vania Rea. Jarold Motto, MD, Caleen Essex, FAGA  Electronically Signed    DRP/MedQ  DD: 06/10/2007  DT: 06/10/2007  Job #: [id number]   cc:   Ernestina Penna, M.D.  Clinton D. Maple Hudson, MD, FCCP, FACP  Nicki Guadalajara, M.D.

## 2010-07-11 NOTE — Assessment & Plan Note (Signed)
[facility]                         GASTROENTEROLOGY OFFICE NOTE   NAME:Joshua [name], Joshua [name]                        MRN:          [id number]  DATE:05/13/2007                            DOB:          January 09, 1946    HISTORY:  Joshua [name] is a 65 year old white male with multiple cardiac  problems including hypertrophic cardiomyopathy, coronary artery disease,  previous stents and chronic anticoagulation with Coumadin and aspirin.  He had some dark stools last week while taking twice a day aspirin and  Coumadin.  He self-discontinued his aspirin and is now back on it once a  day, but continues on his Coumadin.  I have previously seen him because  of reflux symptoms.  He takes omeprazole 20 mg a day.   He underwent endoscopic and colonoscopy exams in April 2008 because of  finds of fecal occult blood on stool exams.  He had some nonspecific  gastritis and a 4 cm hiatal hernia with a negative H. pylori exam.  Colonoscopy was entirely normal.   On omeprazole, Joshua [name] has continued reflux symptoms, nausea, gas and  bloating.  He is having regular bowel movements at this time, but denies  other gastrointestinal symptoms.   MEDICATIONS:  1. In addition to his aspirin and Coumadin, he takes verapamil 240 mg      a day.  2. Lasix 20 mg a day.  3. Potassium supplements.  4. Crestor 10 mg a day.   The patient is being followed by Dr. Maple Hudson because of idiopathic pleural  effusions.  He had a negative paracentesis in August 2006 and negative  exams for fungi and AFB.  The patient has a history of idiopathic occult  liver disease, otherwise negative liver workup and normal liver function  tests at this time.  Ammonia level in 2006 was slightly elevated at 55.  He does have old granulomatous disease in his chest.  CT scan of the  abdomen in March 2008 suggested macrolobular cirrhosis with a prominent  caudate in left lobe of the liver.  There was no evidence of  ascites.  As mentioned above, his endoscopic exam showed no evidence of portal  hypertension or varices.  Previous liver workup was entirely normal  including hepatitis serologies, alpha 1A triptan level, ceruloplasm,  antinuclear antibody, antimitochondrial antibody, normal alpha  fetoprotein level and a negative smooth muscle antibody.   PHYSICAL EXAMINATION:  GENERAL:  He is awake and alert in no acute  distress.  VITAL SIGNS:  He weighs 201 pounds which is his normal weight, blood  pressure 124/56, pulse was 60 and regular.  MENTAL STATUS:  Entirely normal.  ABDOMEN:  I could not appreciate any definite organomegaly, abdominal  masses or tenderness.  Bowel sounds were generally normal.  RECTAL:  Inspection of his rectum showed some external hemorrhoids and  skin tags.  The rectal exam was otherwise unremarkable.  There was soft  stool present.  It was guaiac negative.   ASSESSMENT:  1. Chronic gastroesophageal reflux disease, doing well on omeprazole,      but he probably needs stronger PPI acid suppressant  therapy.  2. Questionable recent melena related to excessive salicylate doses.  3. Suspected cryptogenic cirrhosis which has not ever been actually      proven.  4. Ischemic cardiomyopathy and coronary artery disease with previous      coronary artery stenting.  5. Chronic anticoagulation.  6. Chronic idiopathic pleural effusions.   RECOMMENDATIONS:  1. Repeat CBC and anemia profile.  2. Change to Nexium 40 mg q.a.m.  3. Continue all other multiple medications as listed above per Dr.      Christell Constant and Dr. Tresa Endo.  4. GI followup in 1-2 months' time and consider repeat imaging exams      of the liver and repeat alpha fetoprotein level.     Vania Rea. Jarold Motto, MD, Caleen Essex, FAGA  Electronically Signed    DRP/MedQ  DD: 05/13/2007  DT: 05/14/2007  Job #: [id number]   cc:   Ernestina Penna, M.D.  Clinton D. Maple Hudson, MD, FCCP, FACP  Nicki Guadalajara, M.D.

## 2010-07-14 NOTE — Cardiovascular Report (Signed)
NAME:  Joshua [name], Joshua [name] NO.:  [id number]   MEDICAL RECORD NO.:  [id number]          PATIENT TYPE:  INP   LOCATION:  2922                         FACILITY:  [facility]   PHYSICIAN:  Nanetta Batty, M.D.   DATE OF BIRTH:  20-Mar-1945   DATE OF PROCEDURE:  05/28/2005  DATE OF DISCHARGE:  05/29/2005                              CARDIAC CATHETERIZATION   Joshua [name] is a 65 year old gentleman with a history of CAD, status post RCA  stenting in 2002 and LAD stenting with Cypher drug-eluting stent in 2003.  He also has hypertrophic obstructive cardiomyopathy and recent onset PAF  which he was placed on Lovenox and Coumadin anticoagulation.  He is admitted  on May 26, 2005 with chest pain.  There are no acute EKG changes.  He  ruled out for myocardial infarction.  He presents now for diagnostic  coronary arteriography to rule out an ischemic etiology.   DESCRIPTION OF PROCEDURE:  The patient was brought to the second floor [facility] in the postabsorptive state.  He was premedicated with  p.o. Valium and IV Versed.  His right groin was prepped and shaved in the  usual sterile fashion.  1% Xylocaine was used for local anesthesia.  A 6-  French sheath was inserted into the right femoral artery using standard  Seldinger technique.  A 6-French right and left Judkins diagnostic catheter  along with a 6-French pigtail catheter were used for selective coronary  angiography and left ventriculography respectively.  Visipaque dye was used  through the entirety of the case.  Retrograde aortic, left ventricular  pullback pressures were recorded.   HEMODYNAMICS:  1.  Aortic systolic pressure 111/59, left ventricular systolic pressure 141      and end-diastolic pressure 23.  There was a 30 mm pullback gradient      noted.   SELECTIVE CORONARY ANGIOGRAPHY:  1.  Left main normal.  2.  LAD:  The LAD stent was widely patent.  There was no evidence of      significant  disease in this territory.  3.  Left circumflex:  Free of significant disease.  4.  Ramus branch:  Small and free of significant disease.  5.  Right coronary artery:  Large dominant vessel with a widely patent      proximal stent.   LEFT VENTRICULOGRAPHY:  RAO left ventriculogram was performed using 25 cc of  Visipaque dye at 12 mL per second.  The overall LV EF was estimated at  greater than 60% with a 30 mL pullback gradient secondary to his  hypertrophic obstructive cardiomyopathy.   IMPRESSION:  [name] has widely patent coronaries with normal left  ventricular function and a 30 mL pullback gradient.  I am not sure the  etiology of chest pain but it does not appear to be ischemic.  The sheath  was removed  and pressure was held on the groin to achieve hemostasis.  The patient left  the lab in stable condition.  He will be reheparinized in four hours and  Coumadin anticoagulation will be given  tonight.  He will be discharged home  once his INR is greater than or equal to 2.  He left the lab in stable  condition.      Nanetta Batty, M.D.  Electronically Signed     JB/MEDQ  D:  05/28/2005  T:  05/29/2005  Job:  [id number]   cc:   Patient's chart   2nd Floor [facility]   [facility]  [address], [place] [address]   Ernestina Penna, M.D.  Fax: [phone]

## 2010-07-14 NOTE — H&P (Signed)
NAME:  Joshua [name], SLAGEL                 ACCOUNT NO.:  [id number]   MEDICAL RECORD NO.:  [id number]          PATIENT TYPE:  AMB   LOCATION:                                FACILITY:  [facility]   PHYSICIAN:  Dennie Maizes, M.D.   DATE OF BIRTH:  [birth year]   DATE OF ADMISSION:  05/22/2004  DATE OF DISCHARGE:  LH                                HISTORY & PHYSICAL   CHIEF COMPLAINT:  Intermittent mild hematuria.   HISTORY OF PRESENT ILLNESS:  This 65 year old male is referred to me by Dr.  Rudi Heap at [facility] for evaluation of  intermittent mild hematuria.   This 65 year old male experienced intermittent mild hematuria about three  weeks ago.  He had three episodes of hematuria over a period of 48 hours,  and then the hematuria cleared up.  The patient denied having any voiding  difficulty, flank pain, fever, or chills.  He has urinary frequency x5 to 6,  and nocturia x1.  He has not had any prior hematuria.  He has a remote  history of having kidney stones about 35 to 40 years ago.   PAST MEDICAL HISTORY:  Coronary artery disease.  Status post cardiac stent  placement x2 in September 2001 and 2005.   PAST SURGICAL HISTORY:  None.   MEDICATIONS:  1.  Verapamil 240 mg one p.o. daily.  2.  vytorin 1_p.o. daily.  3.  Aspirin 81 mg one p.o. daily which has been stopped for the surgery.  4.  Mucinex DM.   ALLERGIES:  TETRACYCLINE.   FAMILY HISTORY:  Positive for heart disease, renal failure, and colon  carcinoma.   PHYSICAL EXAMINATION:  VITAL SIGNS:  Height 5 feet 9 inches, weight 226  pounds.  HEENT:  Normal.  NECK:  No masses.  LUNGS:  Clear to auscultation.  HEART:  Regular rate and rhythm.  Harsh systolic murmur 4/6 is heard all  over the precordium.  ABDOMEN:  Soft, no palpable flank mass, no CVA tenderness, bladder not  palpable.  GENITOURINARY:  Penis and testes are normal.  RECTAL:  Small benign prostate.   LABORATORY DATA:  The patient has  undergone outpatient evaluation.  Renal  function is normal.  BUN 23, creatinine 1.  Urine culture and sensitivity  reveal no growth.  Urine cytology revealed benign cells.  Renal ultrasound  and a CT scan of the abdomen and pelvis with and without contrast have been  done.  These x-rays revealed moderate left pleural effusion secondary to  pneumonia.  The patient had cirrhosis of the liver with enlarged abdominal  lymph nodes.  A 2 cm size celiac lymph node was also noted.  Small left  renal cyst was noted.  There was no evidence of any solid renal mass,  calculus, or obstruction.   IMPRESSION:  Hematuria.   PLAN:  Flexible cystoscopy under local anesthesia in [facility] Stay [facility].  In  view of the heart murmur, the patient will receive intravenous antibiotics  for subacute bacterial endocarditis prophylaxis.      SK/MEDQ  D:  05/21/2004  T:  05/21/2004  Job:  [id number]   cc:   Ernestina Penna, M.D.  [address] [place]  [place] [address]  Fax: [phone]

## 2010-07-14 NOTE — Cardiovascular Report (Signed)
[facility]  Patient:    Joshua [name], Joshua [name] Visit Number: [id number] MRN: [id number]          Service Type: CAT Location: 3700 [id number] Attending Physician:  Virgina Evener Dictated by:   Lennette Bihari, M.D., [facility] Proc. Date: 01/15/01 Admit Date:  01/15/2001   CC:         Monica Becton, M.D., [facility]  Cardiac Catheterization Laboratory   Cardiac Catheterization  PROCEDURE:   Left heart catheterization and percutaneous intervention.  CARDIOLOGIST:  Lennette Bihari, M.D., [facility]  INDICATIONS:  [name] is a 65 year old white male with a documented hypertrophic cardiomyopathy.  In 1991, cardiac catheterization showed normal coronary arteries.  Initially he had marked left ventricular hypertrophy with wall thickness measuring 24 mm.  He has been on verapamil over the past decade which resulted in improvement in diastolic function and regression of left ventricular hypertrophy.  He recently underwent a followup echo Doppler study which showed wall thickness of 15 to 16 mm.  He had heavy mitral annular calcification, mild aortic regurgitation, with mild MR.  There was a 19 mm left ventricular outflow tract gradient.  A Cardiolite scan showed possible inferior wall scar marked diaphragmatic attenuation with mild peri-infarction ischemia.  In addition, there was a suggestion of mild lateral wall ischemia as well as possible septal abnormality with septal reversibility towards the apex.  In contrast to his echo Doppler data which showed hyperdynamic LV function, following stress, ejection fraction was decreased at 43%.  The patient is now referred for definitive cardiac catheterization.  HEMODYNAMIC DATA: 1. Aortic pressure was 134/73. 2. Left ventricular pressure 134/20, post A wave 35.  ANGIOGRAPHIC DATA:  Left main coronary artery was angiographically normal and bifurcated into an LAD and left circumflex  system.  The LAD gave rise to two proximal diagonal vessels.  Just after the second proximal diagonal vessel in the region of the takeoff of the first septal perforating artery, there was a long tubular smooth narrowing in the LAD of 50 to 60%.  Beyond the septal perforator, this narrowed to 60 to 70%.  The remainder of the LAD was free of significant disease.  The circumflex vessel was a large normal vessel that gave rise to one small first marginal vessel and two moderate size second and third marginal vessels. From the left injection, there was collateralization to the PDA of the right coronary artery.  The right coronary artery had focal 95% stenosis just proximal to the proximal bend in the vessel before takeoff of an anterior marginal branch.  The left subclavian and internal mammary artery were inject in the event the patient ultimately required surgery.  This was angiographically normal.  BIPLANE CINE LEFT VENTRICULOGRAPHY revealed hyperdynamic LV function with left ventricular hypertrophy and near cavity obliteration seen on the RAO projection.  Ejection fraction was at least 70%.  In the LAO projection, there was a suggestion of low inferolateral subtle hypocontractility.  DISTAL AORTOGRAPHY did not demonstrate renal artery stenosis.  There was no significant aortoiliac disease.  INTERVENTION:  Following the diagnostic cardiac catheterization with the demonstration of high-grade proximal RCA, decision was made to attempt intervention of this RCA presently.  The 6-French sheath was upgraded to a 7-French system.  The patient was given 300 mg of oral Plavix, 5000 units of weight-adjusted heparinization, and double bolus Integrilin glycoprotein IIb/IIIa inhibition therapy.  An FR4 with side holes 7-French guide was used for the intervention.  ACT was documented to be therapeutic.  A Patriot wire was advanced down the RCA.  Pre-dilatation was done with a 2.5 x 15 mm CrossSail  balloon.  A 3.0 x 13 mm Zeta multilink stent was then inserted with careful attention to place this just proximal to the anterior RV marginal branch and distal to the SA nodal artery.  This was dilated to a maximum of 14 atmospheres corresponding to a 3.25 mm size.  Due to the tapering of the vessel, it was felt that the proximal portion should be made somewhat larger. A 3.5 x 8 mm Quantum balloon was then inserted with dilatation up to 3.42 mm in the proximal portion of the stent, 3.3 mm in the mid portion of the stent, with the distal portion of the stent being 3.25 mm size. ______ angiography confirmed an excellent angiographic result.  There was TIMI-3 flow.  There was no evidence for dissection.  IMPRESSION: 1. Hyperdynamic left ventricular function with left ventricular hypertrophy    and near cavity obliteration in this patient with documented hypertrophic    cardiomyopathy. 2. Subtle inferolateral hypocontractility. 3. Two-vessel coronary artery disease with smooth 50 to 70% tapering in the    proximal left anterior descending artery in a tubular fashion and focal    95% proximal right coronary artery stenosis with beginnings of    left-to-right collaterals. 4. No significant aortoiliac disease. 5. Normal subclavian/left internal mammary artery system. 6. Successful percutaneous transluminal coronary angioplasty/stenting of    the proximal right coronary artery utilizing a 3.0 x 13 mm Zeta multilink    stent with post dilatation for optimal stent tapering, utilizing a    3.5 x 8 mm Quantum balloon with percent diameter stenosis being reduced to    0%. Dictated by:   Lennette Bihari, M.D., [facility] Attending Physician:  Virgina Evener DD:  01/15/01 TD:  01/15/01 Job: [id number]

## 2010-07-14 NOTE — Discharge Summary (Signed)
[place]  Patient:    Joshua [name], Joshua [name] Visit Number: [id number] MRN: [id number]          Service Type: CAT Location: [id number] [phone] Attending Physician:  Virgina Evener Dictated by:   [facility], [place]. Admit Date:  01/15/2001 Discharge Date: 01/16/2001   CC:         Monica Becton, M.D.   Discharge Summary  ADMISSION DIAGNOSES: 1. Unstable angina. 2. Status post Persantine Cardiolite recently that showed possible inferior    wall scar versus ______ attenuation and suggested mild peri-infarct    ischemia. There was also suggestion of mild lateral wall ischemia.    Ejection fraction 43%. 3. Status post recent echocardiogram revealing concentric left ventricular    hypertrophy and hyperdynamic left ventricular function, estimated ejection    fraction 80%.  There is almost near complete cavity obliteration.  There is    a 19 mm left ventricular outflow track gradient.  Mild aortic    regurgitation.  Heavy mitral annular calcification.  Mild mitral    regurgitation.  Mild pulmonary hypertension. 4. History of hypertrophic cardiomyopathy. 5. History of cardiac catheterization in 1991 which revealed normal coronary    arteries. 6. Hyperlipidemia.  DISCHARGE DIAGNOSES: 1. Unstable angina. 2. Status post Persantine Cardiolite recently that showed possible inferior    wall scar versus ______ attenuation and suggested mild peri-infarct    ischemia. There was also suggestion of mild lateral wall ischemia.    Ejection fraction 43%. 3. Status post recent echocardiogram revealing concentric left ventricular    hypertrophy and hyperdynamic left ventricular function, estimated ejection    fraction 80%.  There is almost near complete cavity obliteration.  There is    a 19 mm left ventricular outflow track gradient.  Mild aortic    regurgitation.  Heavy mitral annular calcification.  Mild mitral    regurgitation.  Mild pulmonary  hypertension. 4. History of hypertrophic cardiomyopathy. 5. History of cardiac catheterization in 1991 which revealed normal coronary    arteries. 6. Hyperlipidemia. 7. Status post cardiac catheterization on 01/15/01, by Dr. Nicki Guadalajara, with    percutaneous transluminal coronary angioplasty stent to the right coronary    artery.  There was also a 50 to 60% residual disease in the proximal mid    left anterior descending artery.  Ejection fraction 80%.  There was near    cavity obliteration at catheterization.  HISTORY OF PRESENT ILLNESS:  [name] presented to the office for followup evaluation on 01/01/01.  He is a 65 year old white male with a history of documented hypertrophic cardiomyopathy.  In 1991, he underwent cardiac catheterization which revealed normal coronary arteries.  He initially had marked left ventricular hypertrophy with wall thickness measuring 24 mm.  He had been on verapamil for numerous years which resulted in improved diastolic function and actual regression of left ventricular hypertrophy.  Just before this followup visit on 01/01/01, Dr. Tresa Endo had recently recommended followup echocardiogram and Persantine Cardiolite scan.  Echocardiogram revealed concentric left ventricular hypertrophy and hyperdynamic left ventricular function, and estimated ejection fraction 80%.  There was almost near complete cavity obliteration.  Wall thickness measured 15 to 16 mm, and there was 19 mm left ventricular outflow track gradient.  Mild aortic regurgitation, heavy mitral annular calcification with mild mitral regurgitation and mild stenosis.  There was mild pulmonary hypertension, estimated PA pressure of 40 to 45 mm with left atrial enlargement, RV enlargement and normal RV systolic function.  Recent Persantine Cardiolite was notable for LV dilatation and possible inferior wall scar versus marked diaphragmatic attenuation and suggested mild peri-infarct ischemia.  As  well, there was suggestion of mild lateral wall ischemia noted primarily on the horizontal long axis.  There appeared to be reversibility of the septum towards the apex.  Ejection fraction is calculated at 43%.  At the followup office visit on 01/01/01, Joshua [name] was noted he was having exertional development of chest heaviness and tightness.  This typically lasted minutes and then resolved stopping his activity.  At the time of that office visit, Dr. Tresa Endo had a lengthy discussion with Joshua [name].  He reviewed the Cardiolite as well as the echocardiogram data.  The patient was experiencing some mild exertional and precipitated chest pressure. It was felt that given his Cardiolite findings and his symptoms, it was Dr. Ellin Goodie recommendation that definitive repeat catheterization be performed to make certain there was no significant development of obstructive coronary artery disease.  At that time, Dr. Tresa Endo added Toprol XL 25 mg q.d. which would then be increased to 50 mg q.d.  He also made plans for definitive diagnostic cardiac catheterization to be scheduled.  The patient was agreeable with this approach and understood risks and benefits of procedure.  HOSPITAL COURSE:  On 01/15/01, Joshua [name] underwent cardiac catheterization by Dr. Nicki Guadalajara.  He performed percutaneous transluminal coronary angioplasty stent to the proximal right coronary artery.  This went from a 95% stenosis to a 0% residual.  There was no other significant disease in the right coronary artery.  Left circumflex and its branches were free of significant disease. The proximal mid left anterior descending artery did have some 50 to 60% stenosis.  Ejection fraction was calculated at 80%, and there was near cavity obliteration as well as left ventricular hypertrophy.  The patient tolerated the procedure well without complications.  He was continued on Integrilin and was treated with Plavix and heparin as  well.  Post procedure Dr. Tresa Endo planned to adjust the medications.  It was felt that  given the patient being on verapamil that he should decrease Zocor to 10 mg. However, for full lipid lowering effect he would also add ______ 10 mg q.d. Plan to titrate the Toprol to 50 mg.  Add low dose ACE inhibitor.  Plan for discharge home in the morning if stable.  On the morning of 01/16/01, Joshua [name] remained stable.  He feels great, and has no further chest pain.  His examination is essentially benign with no JVD, no rales.  He does have a 2/6 murmur.  His groin is well healed without hematoma or bleed.  EKG shows sinus bradycardia with left ventricular hypertrophy and repolarization changes, but no significant abnormalities. Potassium is normal at 4.3, hemoglobin and hematocrit are stable at 14.3 and 41.5, respectively.  Platelet count is stable at 151 post anticoagulation.  At this time he is seen and evaluated by Dr. Nicki Guadalajara who deems him stable for discharge home.  Medication adjustments as above.  He does plan to do a followup exercise Cardiolite in January to assess left anterior descending artery as potential for ischemia.  This will not yet be scheduled, but it does need to be noted at the follow-up office visit.  CONSULTATIONS:  None.  PROCEDURES:  Cardiac catheterization on 01/15/01, by Dr. Nicki Guadalajara.  This revealed the following angiographic data:  Left main angiographically normal. Left anterior descending artery gave rise to two proximal diagonal vessels. Just after  the second proximal diagonal vessel there was a long tubulus narrowing in the left anterior descending artery of 50 to 60%.  Beyond the septal perforator this narrowed to 60 to 70%.  The remainder of left anterior descending artery free of significant disease.  Circumflex:  Large normal vessel.  Gave rise to a first marginal and two moderate sized second and third marginals.  There was collateralization to  the posterior descending coronary artery and right coronary artery.  Right coronary artery had focal 95% stenosis just proximal to the proximal bend in the vessel before takeoff of an anterior marginal branch.  Ejection fraction 70%.  There was near cavity obliteration seen in the RAO projection.  There was no renal artery stenosis, and no significant aortoiliac disease.  At that time, Dr. Tresa Endo proceeded with successful percutaneous transluminal coronary angioplasty and stenting of the proximal right coronary artery utilizing a 3.0 x 13 mm Zeta multi-length stent with post-dilatation for optimal stent tapering utilizing a 3.5 x 8 mm Quantum balloon with percent diameter stenosis being reduced to 0%.  LABORATORY DATA:  Laboratories from the hospital are not yet printed. However, pre-procedure labs on 01/13/01, had shown white blood cell count 6.3, hemoglobin 15.5, hematocrit 45.3, platelets 165, INR 1.1, PTT 30, sodium 138, potassium 4.7, chloride 108, CO2 29, glucose 87, BUN 14, creatinine 1.1. Lipid profile at that time shows total cholesterol 194, triglycerides 153, HDL 44, LDL 119.  Liver function tests were all normal.  On 01/16/01, potassium 4.3, platelet count 151, hemoglobin 14.3, hematocrit 41.5.  EKG shows sinus bradycardia, left ventricular hypertrophy with repolarization change, but otherwise no other significant ST-T change, no acute changes on EKG.  Radiology:  Chest x-ray report pending.  DISCHARGE MEDICATIONS: 1. Plavix 75 mg q.d. x 30 days. 2. Aspirin 81 mg q.d. then to increase back up to 325 mg q.d. 3. Verapamil 240 mg q.d. 4. Toprol XL 50 mg q.d. 5. Zocor 10 mg one q.h.s. 6. ______ 10 mg q.h.s. 7. Altace 2.5 mg one q.d. 8. Nitroglycerin 0.4 mg sublingual as directed for chest pain. 9. Zinc and vitamin E as before.  ACTIVITY:  No strenuous activity, lifting greater than 5 pounds, driving, or sexual activity for three days.  DIET:  Low salt, low fat, low  cholesterol.  WOUND CARE:  May gently wash the groin with warm water and soap.  Call the office at [phone] if any bleeding or increased pain to the groin.  FOLLOWUP:  I have made an appointment for him to follow up at the [facility] office with Dr. Tresa Endo on January 30, 2001, at 10:15 a.m.  He is to contact us if he has any problems or questions sooner.  Of note, Dr. Tresa Endo does plan to get another exercise Cardiolite in January, to assess for possible left anterior descending artery ischemia.  This is not yet scheduled as it is not to be done until January. Dictated by:   [facility] [place], [place]. Attending Physician:  Virgina Evener DD:  01/16/01 TD:  01/16/01 Job: [id number]

## 2010-07-14 NOTE — Discharge Summary (Signed)
[place]  Patient:    Joshua [name], Joshua [name] Visit Number: [id number] MRN: [id number]          Service Type: CAT Location: [phone] Attending Physician:  Virgina Evener Dictated by:   Raymon Mutton, P.A. Admit Date:  08/11/2001 Discharge Date: 08/12/2001                             Discharge Summary  DATE OF BIRTH:  [birth year].  DISCHARGE DIAGNOSES: 1. Coronary artery disease status post coronary angiography. 2. Hypertrophic cardiomyopathy with ejection fraction 43%. 3. Hyperlipidemia.  HISTORY OF PRESENT ILLNESS:  The patient is a 65 year old gentleman with documented hypertrophic cardiomyopathy and abnormal ejection fraction of 43% was seen in the office on August 08, 2001 with complaint of increased weakness and decreased physical activity tolerance.  The patient underwent catheterization previously in 2002 and that catheterization showed 95% of proximal right coronary artery stenosis, total occlusion of the mid posterior descending artery with left to right collaterals supplying the posterior descending artery vessel. The patient also had 50 to 70% tapering of the proximal left anterior descending artery in a tubular fashion.  The patient is status post percutaneous transluminal coronary angioplasty and stenting of the proximal right coronary artery in 2002.  Considering his history decision was made to proceed with elective coronary angiography for diagnosis and proceed to intervention if the need should arise.  PROCEDURE:  Coronary angiography and cardiac catheterization performed by Dr. Tresa Endo on August 11, 2001.  The patient had intervention to the left anterior descending artery with Cypher stent 3.5X23 placement with reduction of the lesion from 80% to 0%.  HOSPITAL COURSE:  The patient tolerated the procedure well and had no complications.  The patient was transferred to the unit in stable condition. The patient was  discharged home the following day.  His groin incision developed a slight ooze after the catheterization but was stabilized after pressure dressing.  The patient ambulated with no difficulty and was considered stable for discharge home.  LABORATORY DATA:  Renal function was checked in the morning and it was fine, BUN 11, creatinine 1.0.  His potassium was 3.9, glucose was 103, sodium 138. Hemoglobin 14.6, hematocrit 42.3, white blood cell count 7.2, glucose 158.  The patient was stable and was discharged home on August 12, 2001.  DISCHARGE INSTRUCTIONS:  Patient is allowed to shower but is instructed to wash the groin area gently with mild soap and pat dry.  He was instructed to avoid driving, lifting greater than 5 to 10 pounds, and avoid any physical strenuous activity.  The patient was advised to report any problems with the groin puncture site to our office and our phone number was provided.  DISCHARGE FOLLOW UP:  The patient is to follow up with Dr. Tresa Endo September 02, 2001 at 12:00 p.m.  DISCHARGE MEDICATIONS: 1. Aspirin 81 mg q.d. 2. Plavix 75 mg qd 3. Altace 5 mg q.d. 4. Verapamil 240 mg q.d. 5. Toprol XL 12.5 mg q.d. 6. Zetia 10 mg q.d. 7. Zocor 10 mg q.d. Dictated by:   Raymon Mutton, P.A. Attending Physician:  Virgina Evener DD:  08/12/01 TD:  08/13/01 Job: [id number]

## 2010-07-14 NOTE — Op Note (Signed)
NAME:  Joshua [name], Joshua [name]                 ACCOUNT NO.:  [id number]   MEDICAL RECORD NO.:  [id number]          PATIENT TYPE:  AMB   LOCATION:  DAY                           FACILITY:  [facility]   PHYSICIAN:  Dennie Maizes, M.D.   DATE OF BIRTH:  Nov 19, 1945   DATE OF PROCEDURE:  05/22/2004  DATE OF DISCHARGE:                                 OPERATIVE REPORT   PREOPERATIVE DIAGNOSIS:  Hematuria, rule out bladder lesions.   POSTOPERATIVE DIAGNOSIS:  Hematuria, normal bladder.   OPERATIVE PROCEDURE:  Cystoscopy.   ANESTHESIA:  Local 2% Xylocaine release.   SURGEON:  Dr. Rito Ehrlich.   COMPLICATIONS:  None.   INDICATIONS FOR PROCEDURE:  This 65 year old male has a history of  intermittent mild hematuria. His x-rays and urine cytology were negative.  The patient was taken to the operating room today for cystoscopy to rule out  bladder lesions.   DESCRIPTION OF PROCEDURE:  The patient was placed on the cystoscopy table in  the supine position. The lower abdomen and genitalia were prepped and draped  in a sterile fashion. Five cc of 2% Xylocaine release  was instilled into  the urethra and the penis was clamped for about 5 minutes. Cystoscopy was  done with the 15-French flexible cystoscope. The urethra and prostate were  normal. The bladder was then examined closely. There was no  abnormality inside the bladder. The trigone, ureteral orifices and bladder  mucosa were unremarkable. There was no evidence of any foreign body, tumor  or inflammation inside the bladder. The cystoscope was then removed. The  patient voided well, and he was transferred to the short-stay center in  stable condition.      SK/MEDQ  D:  05/22/2004  T:  05/22/2004  Job:  [id number]   cc:   Ernestina Penna, M.D.  [address] [place]  [place] [address]  Fax: [phone]

## 2010-07-14 NOTE — Discharge Summary (Signed)
NAME:  Joshua [name], Joshua [name] NO.:  [id number]   MEDICAL RECORD NO.:  [id number]          PATIENT TYPE:  INP   LOCATION:  2922                         FACILITY:  [facility]   PHYSICIAN:  Darlin Priestly, MD  DATE OF BIRTH:  17-Aug-1945   DATE OF ADMISSION:  05/26/2005  DATE OF DISCHARGE:  05/29/2005                                 DISCHARGE SUMMARY   HOSPITAL COURSE:  Joshua [name] is a 65 year old white married male patient of  Joshua [name].  He has a history of [place] and coronary artery disease.  He had just been discharged on May 24, 2005 on Lovenox to Coumadin for new  onset of atrial fibrillation.  He came back to the emergency room with chest  pain.  About 11 a.m., chest pain started.  It was on and off.  he waited  till about 2 p.m. and took some nitroglycerin; he got dizzy-headed,  lightheaded and felt like he might pass out.  The chest pain eased off, but  then it returned.  He had radiation to his ears, no radiation to his arms,  positive shortness of breath, feeling like he could not take a deep breath.  On his point-of-care markers, his first troponin was elevated at 0.11.  At  the time seen in the ER, he was pain-free.  It was decided that he should be  admitted, ruled out for an MI and should go on for a cardiac  catheterization.  He does have a prior medical history of coronary artery  disease with a cath in 2002.  He had a ZETA stent placed to his RCA -- it  was a 95% lesion -- and then in 2003, he had a stent to his LAD.  He had no  further chest pain during his hospitalization, then on May 28, 2005, he  underwent cardiac catheterization by Joshua [name].  He had patent  stents to his RCA and his LAD.  His LV was normal.  He was put back on  heparin-to-Coumadin.  On May 29, 2005, it was decided that he could be  switches to Lovenox.  He already had 4 doses at home and that his Coumadin  could be loaded at home.  His blood pressure was 115/65.  His  heart rate was  55.  Respirations were 18.  Temperature was 97.3.   LABORATORY DATA:  Hemoglobin 12.8, hematocrit 37.3, platelets 192,000, WBC  5.2.  Sodium 135, potassium 3.8, BUN 11, creatinine 0.9.  His glucose was  90.  His CK-MBs:  #1 - 54/1.7, troponin 0.02; #2 - 48/1.4, troponin of 0.03;  #3 - 42/1.4, troponin of 0.03.  His only elevation in troponin was point-of-  care marker in the emergency room; it was 0.11.  Total cholesterol was 125,  triglycerides 63, HDL was 34 and LDL was 78.   RADIOLOGIC FINDINGS:  Chest x-ray showed cardiomegaly and some pulmonary  vascular congestion with chronic pleuro-parenchymal density in his left  lower chest.   DISCHARGE MEDICATIONS:  1.  Verapamil 180 mg one time per day.  2.  Vytorin 10/20 one time per day.  3.  Aspirin 81 mg one time per day.  4.  Coumadin 7.5 mg daily.  5.  Lovenox 100 mg subcu injection every 12 hours.  6.  Protonix 40 mg one time per day x1 month.  7.  Prilosec 20 mg per day.   DISCHARGE DIAGNOSES:  1.  Chest pain, not ischemic-related, probable gastroesophageal reflux      disease or musculoskeletal etiology.  2.  Coronary artery disease, status post catheterization, no progression of      disease, catheterization done May 28, 2005.  Stent in his right      coronary artery and left anterior descending were patent, no other      disease.  3.  Hypertrophic obstructive cardiomyopathy.  4.  Paroxysmal atrial fibrillation and this is new; he is on Lovenox-to-      Coumadin.  5.  Hypertension, controlled.  6.  Hyperlipidemia, low LDL, controlled on current medication.  He does have      a low HDL.  7.  Valvular heart disease with some mild mitral stenosis by echocardiogram.  8.  History of recurrent pleural effusion on the left, not shown on this      chest x-ray.  He did have some mild vascular congestion on x-ray.   SPECIAL DISCHARGE INSTRUCTIONS:  He should have a pro time checked on  Thursday, early  morning.      Joshua [name], N.P.      Darlin Priestly, MD  Electronically Signed    BB/MEDQ  D:  05/29/2005  T:  05/30/2005  Job:  [id number]   cc:   Joshua [name], M.D.  Fax: [phone]

## 2010-07-14 NOTE — Assessment & Plan Note (Signed)
[facility]                               PULMONARY OFFICE NOTE   NAME:[name], [name]                        MRN:          [id number]  DATE:01/15/2006                            DOB:          Apr 28, 1945    PULMONARY FOLLOWUP:   PROBLEMS:  1. Transudative left pleural effusion.  2. Mediastinal adenopathy.  3. Hypertrophic cardiomyopathy.  4. Coronary disease with stent/atrial fibrillation.  5. Esophageal reflux.   HISTORY:  For the past month and a half, he has felt gradually more short of  breath with no sudden event.  There has been some dull right shoulder pain  that comes and goes.  Dr. Christell Constant had done a chest x-ray a while back, but  patient did not get a report.  He denies edema and does not cough up phlegm.  He denies sweats or blood.   MEDICATIONS:  1. Verapamil 240 mg.  2. Vytorin 10/40.  3. Aspirin 81 mg.  4. Lasix 20 mg q.o.d.  5. Potassium.  6. Coumadin.   Drug-intolerant of TETRACYCLINE.   OBJECTIVE:  VITAL SIGNS:  Weight 204 pounds, which is about stable, since  June.  CHEST:  There are some crackles in the mid zones bilaterally.  NECK:  He does not have neck vein distention sitting upright, and I find no  adenopathy.  There is a grade 2-3/6 left parasternal murmur radiating  bilaterally at the upper sternal areas.  EXTREMITIES:  There is no peripheral edema at the ankle and no cyanosis.   IMPRESSION:  Progressive dyspnea to suggest accumulating pleural effusion.   PLAN:  Chest x-ray with question need to do a thoracentesis or get a CT  scan, depending on results.  Will need comparison with prior films.  I will  schedule return in a month so that we have  something specific, but I may be able to work with him sooner than that if  we get the reports back.     Joshua D. Maple Hudson, MD, Tonny Bollman, FACP  Electronically Signed    CDY/MedQ  DD: 01/16/2006  DT: 01/17/2006  Job #: [id number]   cc:   Ernestina Penna, M.D.  Nicki Guadalajara, M.D.

## 2010-07-14 NOTE — Cardiovascular Report (Signed)
[place]  Patient:    Joshua [name], Joshua [name] Visit Number: [id number] MRN: [id number]          Service Type: CAT Location: [id number] Attending Physician:  Virgina Evener Dictated by:   Lennette Bihari, M.D. Proc. Date: 08/11/01 Admit Date:  08/11/2001 Discharge Date: 08/12/2001                          Cardiac Catheterization  PROCEDURE: 1. Left heart catheterization. 2. Cine coronary angiography. 3. Biplane cine left ventriculography. 4. Distal aortography. 5. Primary stenting of the left anterior descending artery done with Angiomax.  INDICATIONS:  The patient is a 65 year old white male with a documented history of hypertrophic obstructive cardiomyopathy.  Catheterization in 1991 initially showed normal coronary arteries.  In November 2002, repeat catheterization was done after ejection fraction was found to be 43% and he had an abnormal Cardiolite scan.  At that time he had total occlusion of the mid PDA with left-to-right collaterals as well as a new 95% right coronary artery proximal stenosis and a smooth 50-70% tapering of the proximal LAD.  He underwent PTCA stenting of the proximal right coronary artery.  He initially had done well.  Recently he had noticed more fatigue.  A followup Cardiolite study demonstrated moderate ischemia inferoapically as well as some mild ischemia in the septum at the mid ventricular level towards the apex. Ejection fraction was 42%.  He is now referred for a definitive repeat catheterization.  DESCRIPTION OF PROCEDURE:  After premedication with Valium 5 mg intravenously, the patient was prepped and draped in the usual fashion.  The right femoral artery was punctured anteriorly and a #6 French sheath was inserted without difficulty.  Diagnostic catheterization was done with #6 French Judkins #4 left and right coronary catheters.  A #6 French pigtail catheter was used for biplane cine left  ventriculography as well as distal aortography.  With the demonstration of progressive stenosis in the LAD, the decision was made to attempt intervention.  The sheath was upgraded to a #7 [place] system.  An FL4 guide was used for the procedure.  Angiomax bivalirudin) was used for anticoagulation and was administered in a bolus and continuous infusion regimen.  ACT was documented to be therapeutic.  A Forte wire was advanced down the LAD and used for lesion length sizing.  A 3.5 x 23-mm Cypher drug-eluting stent was then successfully deployed with careful attention to place the stent just distal to the proximal second diagonal vessel.  This was then post dilated using a 4.0 Quantum balloon with dilatation done exclusively within the stented segment up to a maximum of 3.9 mm.  Scouty angiography confirmed an excellent angiographic result.  There was TIMI-3 flow.  There was no evidence for dissection.  The arterial sheath was sutured in place with plans for sheath removal later today.  HEMODYNAMIC DATA: 1. Central aortic pressure was 1115/61. 2. Left ventricular pressure, particularly at the apex, was 129/37. 3. The patient does have documented hypertrophic obstructive cardiomyopathy.    He also did have a fair amount of ectopy with the catheter in the LV.  ANGIOGRAPHIC DATA: 1. The left main coronary artery was a large normal vessel that bifurcated    into an LAD and left circumflex system.  2. The LAD gave rise to a very proximal first diagonal small vessel.  The    second diagonal vessel was proximal and large and had  tubular stenosis of    40-50%.  This occurred right after the proximal septal perforating artery.    The LAD in this region was then stenosed in a tubular fashion up to 80%.    The LAD gave rise to an additional moderate septal perforator and third    diagonal vessel.  3. The circumflex had mild 20% smooth narrowing in its mid segment.  The    vessel gave rise to  several small obtuse marginal vessels.  4. The right coronary artery had smooth intimal narrowing of approximately    20-30% in the proximal stented segment.  There was mild 20% narrowing in    the mid segment.  The PDA now no longer seemed to be occluded and was    moderate size, extending to the apex.  BIPLANE CINE LEFT VENTRICULOGRAPHY:  Biplane cine left ventriculography revealed hyperdynamic LV function.  There was a suggestion of significant narrowing at the mid cavity level.  DISTAL AORTOGRAPHY:  Distal aortography did not demonstrate any renal artery stenosis.  There was no significant aortoiliac disease.  Following primary stenting done with Angiomax in the LAD utilizing a 3.5 x 23-mm drug-eluting Cypher stent with post dilatation up to 3.9 mm exclusively within this stented segment, the 80% stenosis was reduced to 0%. There was TIMI-3 flow.  There was no evidence for dissection.  IMPRESSION: 1. Normal hyperdynamic left ventricular function. 2. Hypertrophic obstructive cardiomyopathy. 3. Progressive stenosis in the left anterior descending artery with 40-50%    tubular narrowing in the second diagonal vessel, 80% stenosis in the    proximal left anterior descending artery after the second diagonal septal    perforating artery, 20% mid circumflex stenosis, and no significant    restenosis with 20-30% intimal hypoplasia in the proximal right coronary    artery stent with 20% right coronary artery narrowing. 4. Successful primary stenting of the left anterior descending artery    utilizing a 3.5 x 23-mm Cypher drug-eluting stent post dilated to 3.9 mm    done with Angiomax as antithrombin anticoagulation with percent diameter    stenosis being reduced from 80% to 0%. Dictated by:   Lennette Bihari, M.D. Attending Physician:  Virgina Evener DD:  08/11/01 TD:  08/12/01 Job: [id number]

## 2010-07-14 NOTE — Assessment & Plan Note (Signed)
[facility]                         GASTROENTEROLOGY OFFICE NOTE   NAME:Sochacki, JAIME GRIZZELL                        MRN:          [id number]  DATE:05/14/2006                            DOB:          [birth year]    Mr. Murrell is a 65 year old white male with chronic pleural effusions  and mediastinal adenopathy of questionable etiology, being evaluated by  Dr. Jetty Duhamel.  He also has hypertrophic cardiomyopathy, coronary  artery disease, cardiac stenting, and is chronically on aspirin and  Coumadin.  He additionally has atrial fibrillation which has come about  in the last two years.  He seems to be doing well from cardiovascular  standpoint and is followed by Dr. Tresa Endo.   He denies any GI complaints whatsoever, but recently did Hemoccult cards  in Dr. Roe Coombs Moore's office that are guaiac-positive.  He has had no  melena, hematochezia, upper or lower abdominal pain.  I previously saw  this patient and did screening colonoscopy with removal of multiple  polyps approximately three years ago.  At that time, he was not on  Coumadin.  He has been on and off Prilosec per Dr. Tresa Endo because of acid-  reflux symptoms, but currently is asymptomatic and denies dysphagia.   CURRENT MEDICATIONS INCLUDE:  1. Verapamil 240 mg a day.  2. Vytorin 10/20 mg a day.  3. Aspirin 81 mg a day.  4. Lasix 20 mg every other day.  5. Potassium chloride daily.  6. Coumadin 7.5 mg a day.   ALLERGIES:  He has a history of TETRACYCLINE allergies.   FAMILY HISTORY:  Noncontributory.   REVIEW OF SYSTEMS:  Negative without current cardiovascular or pulmonary  complaints.   EXAM:  He is a healthy-appearing white male, in no acute distress,  appearing his stated age.  He weighs 201 pounds.  Blood pressure 114/58  and pulse was 68 and regular.  His chest showed diminished breath sounds  bilaterally, but otherwise was clear.  He was in a regular rhythm at  this time, but did have a  rather prominent 2/6 systolic ejection murmur,  radiating up the sternal areas.  I could not appreciate an S3 gallop.  Abdominal exam was entirely benign.  There is no peripheral edema or  phlebitis.  He did have multiple bruises on his arms from local trauma.  Mental status was clear.   ASSESSMENT:  1. Mr. Thompson has a guaiac-positive stool and is on aspirin and      Coumadin.  I suspect he has an upper GI source.  He does give a      history of acid reflux and previously on PPI therapy.  2. History of colon polyps with colonoscopy due at this time.  3. Ischemic cardiomyopathy with previous stent placement.  4. Hypertrophic cardiomyopathy.  5. Chronic atrial fibrillation with anticoagulation.  6. Bilateral pleural effusions and mediastinal adenopathy of unknown      etiology.  7. Vague history of possible cryptogenic cirrhosis, although this has      never been documented.  We will check for varices at the time of  his endoscopy.   RECOMMENDATIONS:  I have gone ahead and set Mr. Hausmann up for endoscopy  and colonoscopy and placed him __________ while taking Coumadin and  aspirin.  He is to continue his other medications, listed above, until  this has been completed.     Vania Rea. Jarold Motto, MD, Caleen Essex, FAGA  Electronically Signed    DRP/MedQ  DD: 05/14/2006  DT: 05/15/2006  Job #: [id number]   cc:   Joni Fears D. Maple Hudson, MD, FCCP, FACP  Nicki Guadalajara, M.D.

## 2010-07-14 NOTE — Discharge Summary (Signed)
NAME:  Joshua [name], OYSTER NO.:  [id number]   MEDICAL RECORD NO.:  [id number]          PATIENT TYPE:  INP   LOCATION:  4735                         FACILITY:  [facility]   PHYSICIAN:  Nicki Guadalajara, M.D.     DATE OF BIRTH:  [birth year]   DATE OF ADMISSION:  05/23/2005  DATE OF DISCHARGE:  05/24/2005                                 DISCHARGE SUMMARY   DISCHARGE DIAGNOSES:  1.  Paroxysm atrial fibrillation, normal sinus rhythm spontaneously at      discharge.  2.  Hypertrophic cardiomyopathy with echocardiogram this admission.  3.  Chronic left pleural effusion, previously evaluated by Dr. Jetty Duhamel      in August 2006.  4.  Chronic cirrhosis.  5.  Coronary disease with catheterization in 2002 revealing a 50% left main      with a 60-70% left anterior descending artery stenosis in the past and a      95% right coronary artery.  He has had prior left anterior descending      artery and right coronary artery stenting by Dr. Tresa Endo.   HOSPITAL COURSE:  The patient is a 65 year old male followed by Dr. Tresa Endo  and Dr. Christell Constant with prior LAD stent in 2003 and RCA stent in 2002.  He has  hypertrophic cardiomyopathy.  He has had a chronic left pleural effusion and  has been followed by Dr. Fannie Knee.  He had a thoracentesis in August  2006.  He has a chronic cirrhosis of unclear etiology.  He is admitted from  [facility] office May 23, 2005 by Dr. Domingo Sep with atrial fibrillation of  new onset.  He had been having some vague palpitations and weakness for a  couple days.  He came to the office for blood pressure check because his  blood pressure machine at home would not read it accurately.  Blood pressure  was normal but his heart rhythm was irregular and an EKG confirmed atrial  fibrillation.  He was admitted for further evaluation.  Blood pressure on  admission was 110/82.  Dr. Domingo Sep was concerned he may be volume  contracted.  Verapamil was held and he was put on IV  fluids.  His D-dimer  was elevated 1.6.  CT scan of the chest showed no evidence of pulmonary  embolism but he did have a left pleural effusion and some other chronic  changes that are noted.  He was put on IV after heparin for atrial  fibrillation.  His rate was controlled.  An echocardiogram was done which  revealed normal hyperdynamic LV function with a peak outflow tract gradient  of 55 mmHg.  He converted spontaneously after admission to sinus rhythm.  With conversion he felt fine.  We feel he can be discharged May 24, 2005.  He will go home on Lovenox to Coumadin crossover.  He will have a pro time  done Monday.   DISCHARGE MEDICATIONS:  1.  Lovenox 100 mg subcutaneous every 12 hours.  2.  Coumadin 5 mg a day or as directed.  3.  Verapamil 180 mg once  a day.  4.  Vytorin 10/20 a day.  5.  Baby aspirin q.d.   LABS:  Echocardiogram as noted above.  White count 5.3, hemoglobin 13.5,  hematocrit 39.7, platelets 155.  Sodium 135, potassium 3.8, BUN 13,  creatinine 0.8, troponins 0.05, otherwise negative.  D-dimers 1.61, TSH  1.77.  CT scan of the chest is as noted above.  EKG now reveals sinus  rhythm, sinus bradycardia LVH.   DISPOSITION:  The patient is discharged in stable condition.  He will go  home on the Lovenox crossover and follow-up with Dr. Tresa Endo.      Abelino Derrick, P.A.    ______________________________  Nicki Guadalajara, M.D.    Lenard Lance  D:  05/24/2005  T:  05/25/2005  Job:  [id number]   cc:   Ernestina Penna, M.D.  Fax: [phone]   Nicki Guadalajara, M.D.  Fax: [phone]

## 2010-07-17 ENCOUNTER — Ambulatory Visit (INDEPENDENT_AMBULATORY_CARE_PROVIDER_SITE_OTHER)
Admission: RE | Admit: 2010-07-17 | Discharge: 2010-07-17 | Disposition: A | Discharge status: Home / Self Care | Payer: Medicare Other | Source: Ambulatory Visit | Attending: Internal Medicine | Admitting: Internal Medicine

## 2010-07-17 ENCOUNTER — Ambulatory Visit (INDEPENDENT_AMBULATORY_CARE_PROVIDER_SITE_OTHER): Payer: Medicare Other | Admitting: Internal Medicine

## 2010-07-17 ENCOUNTER — Encounter: Payer: Self-pay | Admitting: Internal Medicine

## 2010-07-17 VITALS — BP 124/60 | HR 64 | Temp 97.8°F | Ht 69.0 in | Wt 204.0 lb

## 2010-07-17 DIAGNOSIS — R0989 Other specified symptoms and signs involving the circulatory and respiratory systems: Secondary | ICD-10-CM

## 2010-07-17 DIAGNOSIS — R918 Other nonspecific abnormal finding of lung field: Secondary | ICD-10-CM

## 2010-07-17 DIAGNOSIS — R0609 Other forms of dyspnea: Secondary | ICD-10-CM

## 2010-07-17 DIAGNOSIS — J449 Chronic obstructive pulmonary disease, unspecified: Secondary | ICD-10-CM

## 2010-07-17 DIAGNOSIS — J4489 Other specified chronic obstructive pulmonary disease: Secondary | ICD-10-CM

## 2010-07-17 DIAGNOSIS — R0600 Dyspnea, unspecified: Secondary | ICD-10-CM

## 2010-07-17 LAB — PULMONARY FUNCTION TEST

## 2010-07-17 NOTE — Progress Notes (Signed)
Subjective:    Patient ID: Joshua [name], male    DOB: Jul 14, 1945, 65 y.o.   MRN: [id number]  HPI  41 yowm quit smoking around 1990 with "some breathing problems" saw Dr Maple Hudson and breathing problem resolved to point where could all but the most strenous yardwork s need for any resp meds at all as a baseline.   May 05, 2010 cc sob much worse since cold around Christmas 2011 with runny nose clear rec use clariton some better drainage but no better breathing saw cardiology cardioverted from flutter back to afib but no improvment in breathing. no purulent sputum. worse when tried to lie down at night.  Imp was  Copd plus chf. rec 1) continue omeprazole 20 mg Take one 30-60 min before first meal of the day and add pepcid 20 mg at bedtime for now  2) Increase lasix to two daily  3) we will call you with your cxr report  4) try chlortrimeton 4 mg one every 6 hours for drainage  5) Symbiocort 160 2 puffs first thing in am and 2 puffs again in pm about 12 hours later  6) Work on inhaler technique: relax and blow all the way out then take a nice smooth deep breath back in, triggering the inhaler at same time you start breathing in  7) GERD (REFLUX)  06/05/2010 ov minimal improvement on rx  With marked hoarsenss on symbicort so stopped it.  Please see patient coordinator before you leave today  to schedule CT chest to eval the low 02  Try dulera 100 one puff twice daily   Work on inhaler technique:       GERD (REFLUX)  Due      07/17/2010 ov/ Joshua [name] cc breathing much better since cardioversion able to weed eat, [employer] ok unless carrying heavy feed for dogs.  Sleep ok no 02.  Pt denies any significant sore throat, dysphagia, itching, sneezing,  nasal congestion or excess/ purulent secretions,  fever, chills, sweats, unintended wt loss, pleuritic or exertional cp, hempoptysis, orthopnea pnd or leg swelling.    Also denies any obvious fluctuation of symptoms with weather or environmental changes or  other aggravating or alleviating factors.      Past Medical History:  FIBRILLATION, ATRIAL (ICD-427.31)  Typical atrial flutter s/p [facility] 04/03/10  CORONARY ARTERY DISEASE (ICD-414.00) s/p PCI  HYPERCHOLESTEROLEMIA  Hypertrophic CM  ANEMIA, IRON DEFICIENCY (ICD-280.9)  B12 DEFICIENCY (ICD-266.2)  COLONIC POLYPS, ADENOMATOUS, HX OF (ICD-V12.72)  GASTRITIS (ICD-535.50)  HIATAL HERNIA WITH REFLUX (ICD-553.3)  h/o rheumatic fever/ typhoid fever  poor hearing  DJD  COPD.....................................Marland KitchenWert  - PFT's May 05, 2010 FEV1 .9 (26%) ratio 60  - PFT's Jul 17, 2010  FEV1 (39%) ratio 70 no better p b2 and DLCO  42%  - HFA 75% p coaching 06/05/2010   Family History:  Family History of Colon Cancer: paternal grandfather  mother had an "enlarged heart"  brother died suddenly age 66s following a prolonged episode of chest pain, presumed to be due to MI. Autopsy per pt revealed Ventricular rupture. Pt is not sure that he had HCM.  Emphysema- Brother  Lung CA- Brother (was a smoker)   Social History:  Pt lives in [place].  Occupation: retired from [employer] examiner  Married with two sons and three daughters  Former smoker. Quit in 1987. Smoked approx 10 yrs @ 1ppd  Alcohol Use - no  Illicit Drug Use - no  Objective:   Physical Exam     wt 209 > 210 May 07, 2010 >  209 06/05/2010 > 204 07/17/2010  amb anxious wm nad minimally hoarse HEENT mild turbinate edema. Oropharynx no thrush or excess pnd or cobblestoning. No JVD or cervical adenopathy. Mild accessory muscle hypertrophy. Trachea midline, nl thryroid. Chest was hyperinflated by percussion with diminished breath sounds and moderate increased exp time without wheeze. Hoover sign positive at mid inspiration. Regular rate and rhythm without murmur gallop or rub or increase P2 or edema. Abd: no hsm, nl excursion. Ext warm without cyanosis or clubbing.   cxr 07/17/2010 Comparison:  06/06/2010  Findings: Stable chronic densities in the lower lungs bilaterally. Stable pleural thickening and/or effusions. Heart is mildly enlarged.  IMPRESSION: Stable chronic changes.    Assessment & Plan:

## 2010-07-17 NOTE — Progress Notes (Signed)
PFT done today. 

## 2010-07-17 NOTE — Patient Instructions (Addendum)
Try off dulera to see if it makes any difference and if worse within a week start it back  You do not have severe copd and you never will  If breathing deteriorates and it's not obviously related to your heart rhythm then return to this clinic asap  Good luck!!!

## 2010-07-17 NOTE — Assessment & Plan Note (Addendum)
Mild airflow obstruction by pft's so may have asthma (max treated) but really no much copd - he's not really convinced about this  I had an extended discussion with the patient today lasting 15 to 20 minutes of a 25 minute visit on the following issues:  The proper method of use, as well as anticipated side effects, of this metered-dose inhaler are discussed and demonstrated to the patient. Improved to 75%  Goals of asthma care reviewed and distinguished from copd, which he doesn't really have.     Each maintenance medication was reviewed in detail including most importantly the difference between maintenance and as needed and under what circumstances the prns are to be used.  Please see instructions for details which were reviewed in writing and the patient given a copy.  See instructions for specific recommendations which were reviewed directly with the patient who was given a copy with highlighter outlining the key components.

## 2010-07-17 NOTE — Progress Notes (Signed)
Quick Note:  Spoke with pt and notified of results per Dr. Wert. Pt verbalized understanding and denied any questions.  ______ 

## 2010-07-19 NOTE — Consult Note (Signed)
  NAME:  Joshua, [name]                 ACCOUNT NO.:  [id number]  MEDICAL RECORD NO.:  [id number]           PATIENT TYPE:  I  LOCATION:  2027                         FACILITY:  [facility]  PHYSICIAN:  Noralyn Pick. Eden Emms, MD, FACCDATE OF BIRTH:  [birth year]  DATE OF CONSULTATION: DATE OF DISCHARGE:                                Cardioversion   A 65 year old patient of Dr. [name], history of hypertrophic cardiomyopathy, brought into the hospital for Norpace loading, cardioversion indicated for restoration of AV synchrony.  The patient was on therapeutic Coumadin.  The patient was anesthetized with propofol by Anesthesia.  A single 150-joule biphasic shock was delivered.  The patient converted to normal sinus rhythm.  IMPRESSION:  Successful cardioversion on Norpace.  The patient will have a post cardioversion ECG to monitor his QT interval.  Given the lateness in the day, we will keep until the morning.  He was on therapeutic Coumadin at that time.  No immediate neurological sequela.     Noralyn Pick. Eden Emms, MD, [facility]     PCN/MEDQ  D:  07/05/2010  T:  07/06/2010  Job:  [id number]  Electronically Signed by Charlton Haws MD [facility] on 07/19/2010 03:42:14 PM

## 2010-07-21 NOTE — Discharge Summary (Signed)
NAME:  Joshua [name], Joshua [name] NO.:  [id number]  MEDICAL RECORD NO.:  [id number]           PATIENT TYPE:  I  LOCATION:  2027                         FACILITY:  [facility]  PHYSICIAN:  Hillis Range, MD       DATE OF BIRTH:  [birth year]  DATE OF ADMISSION:  07/03/2010 DATE OF DISCHARGE:  07/06/2010                              DISCHARGE SUMMARY   PRIMARY CARDIOLOGIST:  Nicki Guadalajara, MD  ELECTROPHYSIOLOGIST:  Hillis Range, MD  PRIMARY CARE PROVIDER:  Ernestina Penna, MD  DISCHARGE DIAGNOSIS:  Atrial fibrillation with initiation of Norpace therapy.  SECONDARY DIAGNOSES: 1. Coronary disease. 2. Hyperlipidemia. 3. History of atrial flutter status post prior cardioversion. 4. Chronic Coumadin anticoagulation. 5. Hypertension. 6. Hypertrophic obstructive cardiomyopathy with preserved left     ventricular function. 7. Degenerative joint disease. 8. Chronic obstructive pulmonary disease. 9. Rheumatic fever. 10.Gastritis. 11.History of hiatal hernia. 12.Iron-deficiency anemia.  ALLERGIES:  TETRACYCLINE.  PROCEDURES:  Successful cardioversion performed on Jul 05, 2010.  HISTORY OF PRESENT ILLNESS:  A 65 year old male with the above complex problem list.  He was recently by Dr. [name] in clinic on June 26, 2010, secondary to recurrent atrial fibrillation.  It was felt that the patient would benefit from antiarrhythmic therapy and ultimately, was determined that Norpace would be the best option given which to avoid, amiodarone in the setting of known lung disease.  Arrangements were made for admission and subsequent initiation.  HOSPITAL COURSE:  The patient presented to [facility] on Jul 03, 2010. The patient was asymptomatic and was in atrial fibrillation with stable QTc.  Norpace 150 mg q.8 h. was initiated and as such, the patient's home dose of verapamil was discontinued.  Further, metoprolol was initiated along with Norpace but secondary to heart rates dipping at  30. This was subsequently discontinued.  The patient's INR remained therapeutic throughout admission.  His QTc remained stable.  He remained in atrial fibrillation on Norpace.  On Jul 05, 2010, the patient underwent successful cardioversion with delivery of 150 joules biphasic shock.  He tolerated this procedure well and postprocedure has maintained sinus rhythm with stable QTc.  He will be discharged home today in good condition.  DISCHARGE LABORATORY DATA:  INR 2.06, hemoglobin 12.2, hematocrit 39.2, WBC 5.4, platelets 148.  Sodium 138, potassium 4.2, chloride 100, CO2 of 33, BUN 20, creatinine 0.02, glucose 86, calcium 9.1.  DISPOSITION:  The patient will be discharged home today in good condition.  FOLLOWUP PLANS AND APPOINTMENTS:  We have arranged for followup with Dr. [name] on August 21, 2010, at 3:45 p.m.  The patient will continue to have his INR checked as previously scheduled.  DISCHARGE MEDICATIONS: 1. Norpace 150 mg q.8 h. 2. Aspirin 81 mg daily. 3. Coumadin 5 mg q.p.m. 4. Crestor 10 mg at bedtime. 5. Dulera inhaler 1 puff b.i.d. 6. Famotidine 10 mg at bedtime. 7. Klor-Con 10 mEq daily. 8. Lasix 20 mg 2 tablets daily. 9. Omeprazole 20 mg daily. 10.Vitamin D3 2000 units b.i.d.  OUTSTANDING LABORATORY STUDIES:  None.  Duration of discharge encounter 45 minutes including physician time.  Nicolasa Ducking, ANP   ______________________________ Hillis Range, MD    CB/MEDQ  D:  07/06/2010  T:  07/07/2010  Job:  [id number]  cc:   Ernestina Penna, M.D. Nicki Guadalajara, M.D.  Electronically Signed by Nicolasa Ducking ANP on 07/12/2010 01:13:07 PM Electronically Signed by Hillis Range MD on 07/21/2010 05:38:02 PM

## 2010-07-23 NOTE — Assessment & Plan Note (Signed)
cxr is clear now, no further f/u planned

## 2010-07-28 ENCOUNTER — Encounter: Payer: Self-pay | Admitting: Internal Medicine

## 2010-07-31 ENCOUNTER — Encounter: Payer: Self-pay | Admitting: Internal Medicine

## 2010-08-21 ENCOUNTER — Encounter: Payer: Self-pay | Admitting: Internal Medicine

## 2010-08-21 ENCOUNTER — Ambulatory Visit (INDEPENDENT_AMBULATORY_CARE_PROVIDER_SITE_OTHER): Payer: Medicare Other | Admitting: Internal Medicine

## 2010-08-21 VITALS — BP 144/85 | HR 85 | Resp 14 | Ht 68.0 in | Wt 204.0 lb

## 2010-08-21 DIAGNOSIS — I4891 Unspecified atrial fibrillation: Secondary | ICD-10-CM

## 2010-08-21 DIAGNOSIS — I4892 Unspecified atrial flutter: Secondary | ICD-10-CM

## 2010-08-21 DIAGNOSIS — I421 Obstructive hypertrophic cardiomyopathy: Secondary | ICD-10-CM

## 2010-08-21 MED ORDER — DISOPYRAMIDE PHOSPHATE 150 MG PO CAPS: ORAL_CAPSULE | ORAL | Status: DC

## 2010-08-21 NOTE — Patient Instructions (Signed)
Your physician wants you to follow-up in: 6 weeks You will receive a reminder letter in the mail two months in advance. If you don't receive a letter, please call our office to schedule the follow-up appointment.  Take Norpace 300mg  in the am then 150mg  at lunch them 300mg  in the pm after you use up what you have change to Norpace CR 300mg  twice daily

## 2010-08-21 NOTE — Progress Notes (Signed)
The patient presents today for routine electrophysiology followup.  Since initiating norpace, the patient reports doing very well. His SOB has significantly improved.  He feels that his exercise tolerance is preserved. He has been kayaking this past weekend without limitation. Today, he denies symptoms of palpitations, chest pain, shortness of breath, orthopnea, PND, lower extremity edema, dizziness, presyncope, syncope, or neurologic sequela.  The patient feels that he is tolerating medications without difficulties and is otherwise without complaint today.   Past Medical History  Diagnosis Date  . Atrial fibrillation     persistent  . Coronary artery disease   . Hypercholesteremia   . Hypercholesteremia   . Iron deficiency anemia, unspecified   . Other B-complex deficiencies   . Hx of adenomatous colonic polyps   . Gastritis   . Hiatal hernia   . Hard of hearing   . Rheumatic fever   . COPD (chronic obstructive pulmonary disease)     PFT 05-05-2010, FEV1 .9 (26%) ratio 60  . DJD (degenerative joint disease)   . Hypertension   . Hypertrophic obstructive cardiomyopathy     mild subaortic stenosis   Past Surgical History  Procedure Date  . Coronary angioplasty     2002-2003  . Shoulder surgery   . Cardioversion May 2012    Dr. [name]    Current Outpatient Prescriptions  Medication Sig Dispense Refill  . acetaminophen (TYLENOL) 325 MG tablet Take 650 mg by mouth as directed.        Marland Kitchen aspirin 81 MG tablet Take 81 mg by mouth daily.        . Cholecalciferol (VITAMIN D3) 2000 UNITS TABS Take by mouth. 1-2 times per day       . furosemide (LASIX) 20 MG tablet Take 40 mg by mouth daily.       Marland Kitchen omeprazole (PRILOSEC) 20 MG capsule Take 20 mg by mouth daily.        . potassium chloride (KLOR-CON) 10 MEQ CR tablet Once daily when takes lasix      . rosuvastatin (CRESTOR) 10 MG tablet Take 10 mg by mouth daily.        Marland Kitchen warfarin (COUMADIN) 5 MG tablet Take 5 mg by mouth. As directed         . DISCONTD: disopyramide (NORPACE) 150 MG capsule Take 1 capsule by mouth every 8 (eight) hours.      . disopyramide (NORPACE) 150 MG capsule Take 2 tablets twice daily  120 capsule  6  . DISCONTD: budesonide-formoterol (SYMBICORT) 160-4.5 MCG/ACT inhaler Inhale 2 puffs into the lungs 2 (two) times daily.        Marland Kitchen DISCONTD: famotidine (PEPCID) 10 MG tablet Take 10 mg by mouth at bedtime and may repeat dose one time if needed.        Marland Kitchen DISCONTD: Mometasone Furo-Formoterol Fum (DULERA) 100-5 MCG/ACT AERO One puff twice daily      . DISCONTD: verapamil (CALAN-SR) 240 MG CR tablet Take 240 mg by mouth. 1 tab qd         Allergies  Allergen Reactions  . Tetracycline     History   Social History  . Marital Status: Widowed    Spouse Name: N/A    Number of Children: 5  . Years of Education: N/A   Occupational History  . retired Patent examiner    Social History Main Topics  . Smoking status: Former Smoker -- 1.0 packs/day for 10 years    Types: Cigarettes    Quit  date: 02/26/1985  . Smokeless tobacco: Not on file  . Alcohol Use: No  . Drug Use: No  . Sexually Active: Not on file   Other Topics Concern  . Not on file   Social History Narrative   Lives in [place] with two sons and three daughters    Family History  Problem Relation Age of Onset  . Colon cancer Paternal Grandfather   . Heart attack Brother 30    Ventircular rupture  . Emphysema Brother   . Lung cancer Brother   . Heart disease Mother     Enlarged heart    ROS-  All systems are reviewed and are negative except as outlined in the HPI above    Physical Exam: Filed Vitals:   08/21/10 1535  BP: 144/85  Pulse: 85  Resp: 14  Height: 5\' 8"  (1.727 m)  Weight: 204 lb (92.534 kg)    GEN- The patient is well appearing, alert and oriented x 3 today.   Head- normocephalic, atraumatic Eyes-  Sclera clear, conjunctiva pink Ears- hearing intact Oropharynx- clear Neck- supple, no JVP Lymph-  no cervical lymphadenopathy Lungs- Clear to ausculation bilaterally, normal work of breathing Heart- irregular rate and rhythm, 2/6 SEM LUSB, no rubs or gallops GI- soft, NT, ND, + BS Extremities- no clubbing, cyanosis, improved edema MS- no significant deformity or atrophy Skin- no rash or lesion Psych- euthymic mood, full affect Neuro- strength and sensation are intact  ekg today reveals atypical appearing atrial flutter, V rate 85 bpm, LVH, nonspecific ST/T changes, QRS 134, QTc 495  Assessment and Plan:

## 2010-08-21 NOTE — Assessment & Plan Note (Signed)
The patient has persistent atrial fibrillation as well as atypical appearing atrial flutter.  He has now returned to atypical atrial flutter but is clinical much improved with norpace.  He is presently rate controlled and appropriately anticoagulated .   I am going to increase norpace to 300mg  BID at this time.  He will return in 4 weeks for follow-up.

## 2010-08-21 NOTE — Assessment & Plan Note (Signed)
As above.

## 2010-08-21 NOTE — Assessment & Plan Note (Signed)
Symptomatically improved with norpace

## 2010-08-28 ENCOUNTER — Telehealth: Payer: Self-pay | Admitting: Internal Medicine

## 2010-08-28 DIAGNOSIS — I4891 Unspecified atrial fibrillation: Secondary | ICD-10-CM

## 2010-08-28 MED ORDER — DISOPYRAMIDE PHOSPHATE ER 150 MG PO CP12: 300.0000 mg | ORAL_CAPSULE | Freq: Two times a day (BID) | ORAL | Status: DC

## 2010-08-28 NOTE — Telephone Encounter (Signed)
Pt was suppose to get Norpace CR 300mg  bid and when he went to pick it up it was the same as he had before it was no change so he needs the new one [facility] in [place]. Patient needs to know what to do with the wrong one he picked up

## 2010-08-28 NOTE — Telephone Encounter (Signed)
Pt calls today b/c he picked up his prescription for Norpace at the pharmacy and noticed it was not the CR.   I have sent that prescription in for pt after reviewing office note. Mylo Red RN

## 2010-09-10 ENCOUNTER — Other Ambulatory Visit: Payer: Self-pay | Admitting: Gastroenterology

## 2010-10-11 ENCOUNTER — Ambulatory Visit (INDEPENDENT_AMBULATORY_CARE_PROVIDER_SITE_OTHER): Payer: Medicare Other | Admitting: Internal Medicine

## 2010-10-11 ENCOUNTER — Encounter: Payer: Self-pay | Admitting: Internal Medicine

## 2010-10-11 DIAGNOSIS — I421 Obstructive hypertrophic cardiomyopathy: Secondary | ICD-10-CM

## 2010-10-11 DIAGNOSIS — I4891 Unspecified atrial fibrillation: Secondary | ICD-10-CM

## 2010-10-11 NOTE — Assessment & Plan Note (Signed)
He is presently maintaining sinus rhythm with norpace.  Continue coumadin longterm.  As he is presently doing very well, I will return the entirety of his care to Dr Tresa Endo.  I will be happy to assist in the future if needed.

## 2010-10-11 NOTE — Progress Notes (Signed)
The patient presents today for routine electrophysiology followup.  Since initiating norpace, the patient reports doing very well. His SOB remains significantly improved.  He feels that his exercise tolerance is preserved.  He is very pleased with his present health state. Today, he denies symptoms of palpitations, chest pain, shortness of breath, orthopnea, PND, lower extremity edema, dizziness, presyncope, syncope, or neurologic sequela.  The patient feels that he is tolerating medications without difficulties and is otherwise without complaint today.   Past Medical History  Diagnosis Date  . Atrial fibrillation     persistent  . Coronary artery disease   . Hypercholesteremia   . Hypercholesteremia   . Iron deficiency anemia, unspecified   . Other B-complex deficiencies   . Hx of adenomatous colonic polyps   . Gastritis   . Hiatal hernia   . Hard of hearing   . Rheumatic fever   . COPD (chronic obstructive pulmonary disease)     PFT 05-05-2010, FEV1 .9 (26%) ratio 60  . DJD (degenerative joint disease)   . Hypertension   . Hypertrophic obstructive cardiomyopathy     mild subaortic stenosis   Past Surgical History  Procedure Date  . Coronary angioplasty     2002-2003  . Shoulder surgery   . Cardioversion May 2012    Dr. [name]    Current Outpatient Prescriptions  Medication Sig Dispense Refill  . acetaminophen (TYLENOL) 325 MG tablet Take 650 mg by mouth as directed.        Marland Kitchen aspirin 81 MG tablet Take 81 mg by mouth daily.        . Cholecalciferol (VITAMIN D3) 2000 UNITS TABS Take by mouth. 1-2 times per day       . disopyramide (NORPACE CR) 150 MG 12 hr capsule Take 2 capsules (300 mg total) by mouth 2 (two) times daily.  120 capsule  11  . furosemide (LASIX) 20 MG tablet Take 40 mg by mouth daily.       Marland Kitchen omeprazole (PRILOSEC) 20 MG capsule TAKE ONE CAPSULE BY MOUTH EVERY DAY  30 capsule  6  . potassium chloride (KLOR-CON) 10 MEQ CR tablet Once daily when takes lasix      .  rosuvastatin (CRESTOR) 10 MG tablet Take 10 mg by mouth daily.        Marland Kitchen warfarin (COUMADIN) 5 MG tablet Take 5 mg by mouth. As directed         Allergies  Allergen Reactions  . Tetracycline     History   Social History  . Marital Status: Widowed    Spouse Name: N/A    Number of Children: 5  . Years of Education: N/A   Occupational History  . retired Patent examiner    Social History Main Topics  . Smoking status: Former Smoker -- 1.0 packs/day for 10 years    Types: Cigarettes    Quit date: 02/26/1985  . Smokeless tobacco: Not on file  . Alcohol Use: No  . Drug Use: No  . Sexually Active: Not on file   Other Topics Concern  . Not on file   Social History Narrative   Lives in [place] with two sons and three daughters    Family History  Problem Relation Age of Onset  . Colon cancer Paternal Grandfather   . Heart attack Brother 30    Ventircular rupture  . Emphysema Brother   . Lung cancer Brother   . Heart disease Mother     Enlarged heart  ROS-  All systems are reviewed and are negative except as outlined in the HPI above    Physical Exam: Filed Vitals:   10/11/10 1514  BP: 136/76  Pulse: 69  Height: 5\' 8"  (1.727 m)  Weight: 201 lb (91.173 kg)    GEN- The patient is well appearing, alert and oriented x 3 today.   Head- normocephalic, atraumatic Eyes-  Sclera clear, conjunctiva pink Ears- hearing intact Oropharynx- clear Neck- supple, no JVP Lymph- no cervical lymphadenopathy Lungs- Clear to ausculation bilaterally, normal work of breathing Heart- regular rate and rhythm, 2/6 SEM LUSB, no rubs or gallops GI- soft, NT, ND, + BS Extremities- no clubbing, cyanosis, improved edema MS- no significant deformity or atrophy Skin- no rash or lesion Psych- euthymic mood, full affect Neuro- strength and sensation are intact  ekg today reveals sinus rhythm 59 bpm, LVH with QRS widening, nonspecific ST/T changes, QRS 146, QTc  502  Assessment and Plan:

## 2010-10-11 NOTE — Patient Instructions (Signed)
Your physician recommends that you schedule a follow-up appointment as needed  

## 2010-10-11 NOTE — Assessment & Plan Note (Signed)
Clinical symptoms are much improved with norpace

## 2010-10-26 ENCOUNTER — Encounter: Payer: Self-pay | Admitting: Internal Medicine

## 2010-11-08 NOTE — Progress Notes (Signed)
Addended by: Judithe Modest D on: 11/08/2010 03:40 PM   Modules accepted: Orders

## 2011-01-11 ENCOUNTER — Telehealth: Payer: Self-pay | Admitting: Gastroenterology

## 2011-01-11 NOTE — Telephone Encounter (Signed)
Received a prior authorization request note from [facility] for Omeprazole. Called [facility] at [phone] for prior authorization. Medication approved from 12/21/10 -01/11/12.

## 2011-04-06 ENCOUNTER — Other Ambulatory Visit: Payer: Self-pay | Admitting: Dermatology

## 2011-04-11 ENCOUNTER — Other Ambulatory Visit: Payer: Self-pay | Admitting: Gastroenterology

## 2011-05-29 HISTORY — PX: TRANSTHORACIC ECHOCARDIOGRAM: SHX275

## 2011-05-29 HISTORY — PX: CARDIOVASCULAR STRESS TEST: SHX262

## 2011-08-27 ENCOUNTER — Other Ambulatory Visit: Payer: Self-pay | Admitting: Internal Medicine

## 2011-08-27 MED ORDER — DISOPYRAMIDE PHOSPHATE ER 150 MG PO CP12: 300.0000 mg | ORAL_CAPSULE | Freq: Two times a day (BID) | ORAL | Status: DC

## 2011-10-08 ENCOUNTER — Encounter: Payer: Self-pay | Admitting: *Deleted

## 2011-10-12 ENCOUNTER — Ambulatory Visit (INDEPENDENT_AMBULATORY_CARE_PROVIDER_SITE_OTHER): Payer: Medicare Other | Admitting: Gastroenterology

## 2011-10-12 ENCOUNTER — Encounter: Payer: Self-pay | Admitting: Gastroenterology

## 2011-10-12 VITALS — BP 140/62 | HR 60 | Ht 68.0 in | Wt 209.0 lb

## 2011-10-12 DIAGNOSIS — I421 Obstructive hypertrophic cardiomyopathy: Secondary | ICD-10-CM

## 2011-10-12 DIAGNOSIS — Z86010 Personal history of colonic polyps: Secondary | ICD-10-CM

## 2011-10-12 DIAGNOSIS — Z800 Family history of malignant neoplasm of digestive organs: Secondary | ICD-10-CM

## 2011-10-12 DIAGNOSIS — K921 Melena: Secondary | ICD-10-CM

## 2011-10-12 DIAGNOSIS — Z7901 Long term (current) use of anticoagulants: Secondary | ICD-10-CM

## 2011-10-12 DIAGNOSIS — K644 Residual hemorrhoidal skin tags: Secondary | ICD-10-CM

## 2011-10-12 DIAGNOSIS — Z8719 Personal history of other diseases of the digestive system: Secondary | ICD-10-CM

## 2011-10-12 MED ORDER — MOVIPREP 100 G PO SOLR: 1.0000 | ORAL | Status: DC

## 2011-10-12 MED ORDER — OMEPRAZOLE 20 MG PO CPDR: 20.0000 mg | DELAYED_RELEASE_CAPSULE | Freq: Every day | ORAL | Status: DC

## 2011-10-12 NOTE — Progress Notes (Signed)
History of Present Illness:  This is a very nice 66 year old Caucasian male with hypertrophic cardiomyopathy chronically on Coumadin and aspirin. He has a family history of colon cancer in his father, has had previous colonoscopies, prior history of iron deficiency anemia from NSAID gastritis, and had a negative colonoscopy 5 years ago. Recent Hemoccult cards were guaiac positive, and he is referred for repeat GI evaluation. He denies any gastrointestinal symptoms, and is on daily Prilosec 20 mg. He does occasionally have some rectal discomfort and bright red blood per him from an anal skin tag. Last summer he was afflicted with [facility] spotted fever and was on several weeks of doxycycline therapy. He is followed by Dr. Daphene Jaeger and cardiology for his previous atrial flutter and IHSS. The patient has been in a normal rhythm for the last year and denies a current cardiovascular or pulmonary complaints. He does have easy bruisability, and is currently being evaluated for painless hematuria. His appetite is good and his weight is stable without any specific food intolerances. He denies use of other NSAIDs but is on aspirin 81 mg a day. Recent CBC showed a hemoglobin of 16.2 with hematocrit of 49.8. Metabolic profile was otherwise unremarkable, chest x-ray has been normal, and apparently his prothrombin time is been therapeutic.  I have reviewed this patient's present history, medical and surgical past history, allergies and medications.     ROS: The remainder of the 10 point ROS is negative... previous CT scan of the abdomen has suggested hepatic cirrhosis, but this has not been confirmed on exam or laboratory testing. The patient is also had previous cardiac stenting performed. Patient has had evidence of old granulomatous lung disease, and is followed by Dr. Joni Fears young and pulmonary. Complete previous hepatic workup showed no evidence of metabolic liver disease or chronic probably hepatitis or liver  cancer.     Physical Exam: Blood pressure 140/62, pulse 60 and regular, weight 209 pounds the BMI of 31.78. Patient has multiple bruises and neck most he is on his extremities and right hip area. General well developed well nourished patient in no acute distress, appearing their stated age Eyes PERRLA, no icterus, fundoscopic exam per opthamologist Skin no lesions noted Neck supple, no adenopathy, no thyroid enlargement, no tenderness Chest clear to percussion and auscultation, diminished breath sounds bilaterally noted. Heart no significant murmurs, gallops or rubs noted.Marland Kitchen loud 3/6 systolic ejection murmur left lower sternal border radiating into neck. He appears to be in a regular rhythm without S3 gallop. Abdomen no hepatosplenomegaly masses or tenderness, BS normal. I cannot appreciate hepatosplenomegaly, abdominal masses, tenderness, or evidence of ascites. Rectal inspection normal no fissures, or fistulae noted.  No masses or tenderness on digital exam. Stool guaiac negative. Posterior skin tag which is sore to palpation noted. To my exam stool is normal color and guaiac-negative. Extremities no acute joint lesions, edema, phlebitis or evidence of cellulitis. Neurologic patient oriented x 3, cranial nerves intact, no focal neurologic deficits noted. Psychological mental status normal and normal affect.  Assessment and plan: This patient seems to be doing well with his multiple medical problems. With his family history of colon cancer in his previous colon polyps, recent guaiac positive stool, he certainly needs followup colonoscopy which has been scheduled. However hold his Coumadin 5 days before this procedure unless otherwise advised by Dr. Tresa Endo. His previous anemia was related to NSAID use, and he continues on daily PPI therapy and 81 mg of aspirin a day. He does have a  posterior anal skin tag which is tender, and may be the source of his recent positive stool cards. There is no evidence  on exam or laboratory testing of chronic liver disease. I reviewed the risk and benefits of colonoscopy and polypectomy in detail, and he is agreed to proceed as planned. Otherwise is to continue his medications as listed and reviewed. Please copy his multiple physicians with this note including Dr. Jetty Duhamel, Dr. Rudi Heap, and Dr.Tom Tresa Endo.  Encounter Diagnoses  Name Primary?  . Family history of colon cancer Yes  . Blood in stool   . History of colon polyps

## 2011-10-12 NOTE — Patient Instructions (Addendum)
You have been scheduled for a Colonoscopy, instructions have been provided. Your prep solution has been sent to your pharmacy. We have sent a letter to Dr Tresa Endo regarding your Coumadin, You will be contaced by our office prior to your procedure for directions on holding your Coumadin/Warfarin.  If you do not hear from our office 1 week prior to your scheduled procedure, please call [phone] to discuss. CC: Rudi Heap MD

## 2011-10-17 ENCOUNTER — Telehealth: Payer: Self-pay

## 2011-10-17 NOTE — Telephone Encounter (Signed)
Pt has been notified to hold coumadin 5 days prior to procedure 

## 2011-10-17 NOTE — Telephone Encounter (Signed)
Pt ok'd to hold coumadin 5 days prior to procedure, letter to be scanned to [employer] left message with family member to call back.

## 2011-10-24 ENCOUNTER — Encounter: Payer: Self-pay | Admitting: Gastroenterology

## 2011-10-24 ENCOUNTER — Ambulatory Visit (AMBULATORY_SURGERY_CENTER): Payer: Medicare Other | Admitting: Gastroenterology

## 2011-10-24 VITALS — BP 147/72 | HR 69 | Temp 96.3°F | Resp 20 | Ht 68.0 in | Wt 209.0 lb

## 2011-10-24 DIAGNOSIS — D126 Benign neoplasm of colon, unspecified: Secondary | ICD-10-CM

## 2011-10-24 DIAGNOSIS — D129 Benign neoplasm of anus and anal canal: Secondary | ICD-10-CM

## 2011-10-24 DIAGNOSIS — Z800 Family history of malignant neoplasm of digestive organs: Secondary | ICD-10-CM

## 2011-10-24 DIAGNOSIS — D128 Benign neoplasm of rectum: Secondary | ICD-10-CM

## 2011-10-24 DIAGNOSIS — Z86010 Personal history of colonic polyps: Secondary | ICD-10-CM

## 2011-10-24 DIAGNOSIS — K921 Melena: Secondary | ICD-10-CM

## 2011-10-24 MED ORDER — SODIUM CHLORIDE 0.9 % IV SOLN: 500.0000 mL | INTRAVENOUS | Status: DC

## 2011-10-24 NOTE — Op Note (Signed)
[facility] [address] [place], [address]   COLONOSCOPY PROCEDURE REPORT  PATIENT: Joshua, [name]  MR#: [id number] BIRTHDATE: 16-Jul-1945 , 66  yrs. old GENDER: Male ENDOSCOPIST: Mardella Layman, MD, [facility] REFERRED BY: PROCEDURE DATE:  10/24/2011 PROCEDURE:   Colonoscopy with snare polypectomy and Colonoscopy with biopsy ASA CLASS:   Class III INDICATIONS:patient's family history of colon polyps and follow up of colonic polyps. MEDICATIONS: Propofol (Diprivan) 180 mg IV  DESCRIPTION OF PROCEDURE:   After the risks and benefits and of the procedure were explained, informed consent was obtained.  A digital rectal exam revealed no abnormalities of the rectum.    The LB [id number]  endoscope was introduced through the anus and advanced to the cecum, which was identified by both the appendix and ileocecal valve .  The quality of the prep was excellent, using MoviPrep .  The instrument was then slowly withdrawn as the colon was fully examined.     COLON FINDINGS: Three smooth sessile polyps ranging between 3-12mm in size were found in the sigmoid colon and descending colon. The scope was then withdrawn from the patient and the procedure completed.  COMPLICATIONS: There were no complications. ENDOSCOPIC IMPRESSION: Three sessile polyps ranging between 3-64mm in size were found in the sigmoid colon and descending colon,THESE WERE COLD SNARE AND COLD BIOPSY REMOVED...  RECOMMENDATIONS: F/U 5Y...RESUME ALL MEDS   REPEAT EXAM:  EX:BMWUXL Christell Constant, MD  _______________________________ eSigned:  Mardella Layman, MD, [facility] 10/24/2011 9:13 AM

## 2011-10-24 NOTE — Progress Notes (Signed)
Patient did not experience any of the following events: a burn prior to discharge; a fall within the facility; wrong site/side/patient/procedure/implant event; or a hospital transfer or hospital admission upon discharge from the facility. (G8907) Patient did not have preoperative order for IV antibiotic SSI prophylaxis. (G8918)  

## 2011-10-24 NOTE — Patient Instructions (Addendum)

## 2011-10-25 ENCOUNTER — Telehealth: Payer: Self-pay | Admitting: *Deleted

## 2011-10-25 NOTE — Telephone Encounter (Signed)
  Follow up Call-  Call back number 10/24/2011  Post procedure Call Back phone  # [phone]  Permission to leave phone message Yes     Patient questions:  Do you have a fever, pain , or abdominal swelling? no Pain Score  0 *  Have you tolerated food without any problems? yes  Have you been able to return to your normal activities? yes  Do you have any questions about your discharge instructions: Diet   no Medications  no Follow up visit  no  Do you have questions or concerns about your Care? no  Actions: * If pain score is 4 or above: No action needed, pain <4.

## 2011-10-31 ENCOUNTER — Encounter: Payer: Self-pay | Admitting: Gastroenterology

## 2012-01-01 ENCOUNTER — Telehealth: Payer: Self-pay | Admitting: Internal Medicine

## 2012-01-01 MED ORDER — DISOPYRAMIDE PHOSPHATE ER 150 MG PO CP12: 300.0000 mg | ORAL_CAPSULE | Freq: Two times a day (BID) | ORAL | Status: DC

## 2012-01-01 NOTE — Telephone Encounter (Signed)
Patient calling for Norpace 150mg  2 tablets twice daily with 3 refills    Micki Riley, CMA

## 2012-01-01 NOTE — Telephone Encounter (Signed)
Disopyramide 150mg  request [facility]ed by [facility] no response, pt now out , needs [facility]

## 2012-05-20 ENCOUNTER — Ambulatory Visit: Payer: Medicare Other | Admitting: *Deleted

## 2012-05-20 ENCOUNTER — Ambulatory Visit (INDEPENDENT_AMBULATORY_CARE_PROVIDER_SITE_OTHER): Payer: Medicare Other | Admitting: Pharmacist Clinician (PhC)/ Clinical Pharmacy Specialist

## 2012-05-20 DIAGNOSIS — E538 Deficiency of other specified B group vitamins: Secondary | ICD-10-CM

## 2012-05-20 DIAGNOSIS — I4891 Unspecified atrial fibrillation: Secondary | ICD-10-CM

## 2012-05-20 LAB — POCT INR: INR: 2.7

## 2012-05-20 MED ORDER — CYANOCOBALAMIN 1000 MCG/ML IJ SOLN
1000.0000 ug | INTRAMUSCULAR | Status: AC
  Administered 2012-05-20 – 2013-04-15 (×6): 1000 ug via INTRAMUSCULAR

## 2012-06-24 ENCOUNTER — Ambulatory Visit: Payer: Medicare Other | Admitting: *Deleted

## 2012-06-24 ENCOUNTER — Ambulatory Visit (INDEPENDENT_AMBULATORY_CARE_PROVIDER_SITE_OTHER): Payer: Medicare Other | Admitting: Pharmacist Clinician (PhC)/ Clinical Pharmacy Specialist

## 2012-06-24 DIAGNOSIS — E538 Deficiency of other specified B group vitamins: Secondary | ICD-10-CM

## 2012-06-24 DIAGNOSIS — I4891 Unspecified atrial fibrillation: Secondary | ICD-10-CM

## 2012-06-24 DIAGNOSIS — D509 Iron deficiency anemia, unspecified: Secondary | ICD-10-CM

## 2012-06-24 LAB — POCT INR: INR: 2.6

## 2012-07-04 ENCOUNTER — Other Ambulatory Visit: Payer: Self-pay | Admitting: Family Medicine

## 2012-07-06 ENCOUNTER — Other Ambulatory Visit: Payer: Self-pay | Admitting: Family Medicine

## 2012-07-29 ENCOUNTER — Ambulatory Visit (INDEPENDENT_AMBULATORY_CARE_PROVIDER_SITE_OTHER): Payer: Medicare Other | Admitting: Pharmacist Clinician (PhC)/ Clinical Pharmacy Specialist

## 2012-07-29 ENCOUNTER — Ambulatory Visit: Payer: Medicare Other

## 2012-07-29 DIAGNOSIS — I4891 Unspecified atrial fibrillation: Secondary | ICD-10-CM

## 2012-07-29 LAB — POCT INR: INR: 3.3

## 2012-08-20 ENCOUNTER — Encounter: Payer: Self-pay | Admitting: Family Medicine

## 2012-08-20 ENCOUNTER — Ambulatory Visit (INDEPENDENT_AMBULATORY_CARE_PROVIDER_SITE_OTHER): Payer: Medicare Other | Admitting: Family Medicine

## 2012-08-20 VITALS — BP 144/52 | HR 49 | Temp 97.1°F | Ht 68.25 in | Wt 212.2 lb

## 2012-08-20 DIAGNOSIS — E538 Deficiency of other specified B group vitamins: Secondary | ICD-10-CM

## 2012-08-20 DIAGNOSIS — I10 Essential (primary) hypertension: Secondary | ICD-10-CM

## 2012-08-20 DIAGNOSIS — D649 Anemia, unspecified: Secondary | ICD-10-CM

## 2012-08-20 DIAGNOSIS — E785 Hyperlipidemia, unspecified: Secondary | ICD-10-CM

## 2012-08-20 DIAGNOSIS — R5383 Other fatigue: Secondary | ICD-10-CM

## 2012-08-20 DIAGNOSIS — I4891 Unspecified atrial fibrillation: Secondary | ICD-10-CM

## 2012-08-20 DIAGNOSIS — R5381 Other malaise: Secondary | ICD-10-CM

## 2012-08-20 LAB — BASIC METABOLIC PANEL WITH GFR
BUN: 25 mg/dL — ABNORMAL HIGH (ref 6–23)
CO2: 25 mEq/L (ref 19–32)
Calcium: 8.8 mg/dL (ref 8.4–10.5)
Chloride: 103 mEq/L (ref 96–112)
Creat: 1.1 mg/dL (ref 0.50–1.35)
GFR, Est African American: 80 mL/min
GFR, Est Non African American: 69 mL/min
Glucose, Bld: 77 mg/dL (ref 70–99)
Potassium: 4.6 mEq/L (ref 3.5–5.3)
Sodium: 139 mEq/L (ref 135–145)

## 2012-08-20 LAB — HEPATIC FUNCTION PANEL
ALT: 21 U/L (ref 0–53)
AST: 26 U/L (ref 0–37)
Albumin: 3.6 g/dL (ref 3.5–5.2)
Alkaline Phosphatase: 55 U/L (ref 39–117)
Bilirubin, Direct: 0.1 mg/dL (ref 0.0–0.3)
Indirect Bilirubin: 0.7 mg/dL (ref 0.0–0.9)
Total Bilirubin: 0.8 mg/dL (ref 0.3–1.2)
Total Protein: 7.3 g/dL (ref 6.0–8.3)

## 2012-08-20 LAB — POCT CBC
Granulocyte percent: 69.4 %G (ref 37–80)
HCT, POC: 46.6 % (ref 43.5–53.7)
Hemoglobin: 16.4 g/dL (ref 14.1–18.1)
Lymph, poc: 1.6 (ref 0.6–3.4)
MCH, POC: 33.2 pg — AB (ref 27–31.2)
MCHC: 35.2 g/dL (ref 31.8–35.4)
MCV: 94.3 fL (ref 80–97)
MPV: 6.3 fL (ref 0–99.8)
POC Granulocyte: 4.5 (ref 2–6.9)
POC LYMPH PERCENT: 25.3 %L (ref 10–50)
Platelet Count, POC: 133 10*3/uL — AB (ref 142–424)
RBC: 5 M/uL (ref 4.69–6.13)
RDW, POC: 13.7 %
WBC: 6.5 10*3/uL (ref 4.6–10.2)

## 2012-08-20 MED ORDER — FUROSEMIDE 20 MG PO TABS: 20.0000 mg | ORAL_TABLET | Freq: Every day | ORAL | Status: DC

## 2012-08-20 MED ORDER — WARFARIN SODIUM 5 MG PO TABS: ORAL_TABLET | ORAL | Status: DC

## 2012-08-20 MED ORDER — VERAPAMIL HCL 120 MG PO TABS: 120.0000 mg | ORAL_TABLET | Freq: Every day | ORAL | Status: DC

## 2012-08-20 MED ORDER — ROSUVASTATIN CALCIUM 10 MG PO TABS: 10.0000 mg | ORAL_TABLET | Freq: Every day | ORAL | Status: DC

## 2012-08-20 NOTE — Progress Notes (Signed)
  Subjective:    Patient ID: Joshua [name], male    DOB: Jul 15, 1945, 67 y.o.   MRN: [id number]  HPI Patient returns to clinic for followup of chronic medical problems. He is followed by Dr. Tresa Endo periodically for his heart. See also the review of systems. He is up-to-date on all his health maintenance issues.   Review of Systems  Constitutional: Negative.   HENT: Positive for voice change (due to drainage) and postnasal drip (due to allergies).   Eyes: Positive for itching (due to  allergies).  Respiratory: Positive for shortness of breath (with exertion).   Gastrointestinal: Positive for constipation (intermitent, due to meds).  Endocrine: Negative.   Genitourinary: Positive for dysuria (intermitent) and frequency.  Musculoskeletal: Positive for arthralgias (L knee, L elbow, L shoulder).  Allergic/Immunologic: Positive for environmental allergies (seasonal).  Psychiatric/Behavioral: Negative.        Objective:   Physical Exam There were no vitals taken for this visit.  The patient appeared well nourished, somewhat overweight but normally developed, alert and oriented to time and place. Speech, behavior and judgement appear normal. Vital signs as documented.  Head exam is unremarkable. No scleral icterus or pallor noted. He has ear cerumen in the right EAC. He is wearing hearing aids bilaterally.  Neck is without jugular venous distension, thyromegally, or carotid bruits. Carotid upstrokes are brisk bilaterally. No cervical adenopathy. Lungs are clear anteriorly and posteriorly to auscultation. Normal respiratory effort. Cardiac exam reveals regular rate and rhythm at 60 per minute. First and second heart sounds normal.  No  rubs or gallops. He does have a grade 3/6 systolic ejection murmur  Abdominal exam reveals normal bowl sounds, no masses, no organomegaly and no aortic enlargement. No inguinal adenopathy. Extremities are nonedematous and both femoral and pedal pulses are  normal. Skin without pallor or jaundice.  Warm and dry, without rash. He also has an area on his right leg and both forearms where he has had abrasions. Neurologic exam reveals normal deep tendon reflexes and normal sensation.          Assessment & Plan:  1. Atrial fibrillation -stable  2. Vitamin B 12 deficiency  3. Hyperlipemia - Hepatic function panel; Standing - NMR Lipoprofile with Lipids; Standing  4. Hypertension - BASIC METABOLIC PANEL WITH GFR; Standing  5. Anemia - POCT CBC; Standing  6. Fatigue - Vitamin D 25 hydroxy; Standing  Patient Instructions  Fall prevention was discussed May try debrox periodically if needed for ear cerumen Continue current medications May try Claritin and or Nasacort AQ over the counter 1-2 sprays each nostril daily as needed for allergic rhinitis  Always drink plenty of fluids

## 2012-08-20 NOTE — Addendum Note (Signed)
Addended by: Prescott Gum on: 08/20/2012 09:29 AM   Modules accepted: Orders

## 2012-08-20 NOTE — Patient Instructions (Addendum)
Fall prevention was discussed May try debrox periodically if needed for ear cerumen Continue current medications May try Claritin and or Nasacort AQ over the counter 1-2 sprays each nostril daily as needed for allergic rhinitis  Always drink plenty of fluids

## 2012-08-21 LAB — NMR LIPOPROFILE WITH LIPIDS
Cholesterol, Total: 126 mg/dL (ref <200)
HDL Particle Number: 31.8 umol/L (ref >=30.5)
HDL Size: 8.9 nm — ABNORMAL LOW (ref >=9.2)
HDL-C: 41 mg/dL (ref >=40)
LDL (calc): 66 mg/dL (ref <100)
LDL Particle Number: 948 nmol/L (ref <1000)
LDL Size: 20.6 nm (ref >20.5)
LP-IR Score: 53 — ABNORMAL HIGH (ref <=45)
Large HDL-P: 4.7 umol/L — ABNORMAL LOW (ref >=4.8)
Large VLDL-P: 1.9 nmol/L (ref <=2.7)
Small LDL Particle Number: 528 nmol/L — ABNORMAL HIGH (ref <=527)
Triglycerides: 94 mg/dL (ref <150)
VLDL Size: 46.8 nm — ABNORMAL HIGH (ref <=46.6)

## 2012-08-21 LAB — VITAMIN D 25 HYDROXY (VIT D DEFICIENCY, FRACTURES): Vit D, 25-Hydroxy: 53 ng/mL (ref 30–89)

## 2012-09-09 ENCOUNTER — Ambulatory Visit (INDEPENDENT_AMBULATORY_CARE_PROVIDER_SITE_OTHER): Payer: Medicare Other | Admitting: Pharmacist Clinician (PhC)/ Clinical Pharmacy Specialist

## 2012-09-09 ENCOUNTER — Ambulatory Visit: Payer: Medicare Other

## 2012-09-09 DIAGNOSIS — E538 Deficiency of other specified B group vitamins: Secondary | ICD-10-CM

## 2012-09-09 DIAGNOSIS — I4891 Unspecified atrial fibrillation: Secondary | ICD-10-CM

## 2012-09-09 LAB — POCT INR: INR: 2.8

## 2012-09-10 NOTE — Progress Notes (Signed)
Quick Note:  Result released to mychart. ______

## 2012-09-15 NOTE — Progress Notes (Signed)
Quick Note:  Released to MyChart. ______ 

## 2012-10-21 ENCOUNTER — Ambulatory Visit (INDEPENDENT_AMBULATORY_CARE_PROVIDER_SITE_OTHER): Payer: Medicare Other | Admitting: Pharmacist Clinician (PhC)/ Clinical Pharmacy Specialist

## 2012-10-21 ENCOUNTER — Other Ambulatory Visit: Payer: Self-pay | Admitting: *Deleted

## 2012-10-21 VITALS — BP 146/52

## 2012-10-21 DIAGNOSIS — I10 Essential (primary) hypertension: Secondary | ICD-10-CM

## 2012-10-21 DIAGNOSIS — E538 Deficiency of other specified B group vitamins: Secondary | ICD-10-CM

## 2012-10-21 DIAGNOSIS — I4891 Unspecified atrial fibrillation: Secondary | ICD-10-CM

## 2012-10-21 LAB — POCT INR: INR: 2.8

## 2012-10-21 MED ORDER — DISOPYRAMIDE PHOSPHATE ER 150 MG PO CP12: 300.0000 mg | ORAL_CAPSULE | Freq: Two times a day (BID) | ORAL | Status: DC

## 2012-10-21 MED ORDER — HYDROCHLOROTHIAZIDE 25 MG PO TABS: 25.0000 mg | ORAL_TABLET | Freq: Every day | ORAL | Status: DC

## 2012-10-22 ENCOUNTER — Telehealth: Payer: Self-pay | Admitting: Family Medicine

## 2012-10-22 NOTE — Telephone Encounter (Signed)
It looks like rx was called in or maybe printed.  Since [facility].

## 2012-11-03 ENCOUNTER — Other Ambulatory Visit: Payer: Self-pay | Admitting: Gastroenterology

## 2012-11-03 NOTE — Telephone Encounter (Signed)
PLEASE MAKE AN OFFICE VISIT FOR FURTHER REFILLS  

## 2012-11-30 ENCOUNTER — Other Ambulatory Visit: Payer: Self-pay | Admitting: Gastroenterology

## 2012-12-02 ENCOUNTER — Other Ambulatory Visit: Payer: Self-pay | Admitting: Gastroenterology

## 2012-12-02 ENCOUNTER — Ambulatory Visit (INDEPENDENT_AMBULATORY_CARE_PROVIDER_SITE_OTHER): Payer: Medicare Other | Admitting: Pharmacist Clinician (PhC)/ Clinical Pharmacy Specialist

## 2012-12-02 DIAGNOSIS — I4891 Unspecified atrial fibrillation: Secondary | ICD-10-CM

## 2012-12-02 DIAGNOSIS — E538 Deficiency of other specified B group vitamins: Secondary | ICD-10-CM

## 2012-12-02 LAB — POCT INR: INR: 3.1

## 2012-12-02 MED ORDER — CYANOCOBALAMIN 1000 MCG/ML IJ SOLN
1000.0000 ug | INTRAMUSCULAR | Status: AC
  Administered 2012-12-02 – 2013-01-20 (×2): 1000 ug via INTRAMUSCULAR

## 2012-12-03 ENCOUNTER — Other Ambulatory Visit: Payer: Self-pay | Admitting: Gastroenterology

## 2012-12-04 ENCOUNTER — Other Ambulatory Visit: Payer: Self-pay | Admitting: *Deleted

## 2012-12-04 MED ORDER — OMEPRAZOLE 20 MG PO CPDR: 20.0000 mg | DELAYED_RELEASE_CAPSULE | Freq: Every day | ORAL | Status: DC

## 2012-12-04 NOTE — Telephone Encounter (Signed)
Patient called and made an office visit follow up Patient understands to keep appointment for further refills

## 2012-12-17 ENCOUNTER — Encounter: Payer: Self-pay | Admitting: Cardiovascular Disease

## 2012-12-17 ENCOUNTER — Ambulatory Visit (INDEPENDENT_AMBULATORY_CARE_PROVIDER_SITE_OTHER): Payer: Medicare Other | Admitting: Cardiovascular Disease

## 2012-12-17 VITALS — BP 150/80 | HR 61 | Ht 68.5 in | Wt 209.1 lb

## 2012-12-17 DIAGNOSIS — I050 Rheumatic mitral stenosis: Secondary | ICD-10-CM

## 2012-12-17 DIAGNOSIS — I480 Paroxysmal atrial fibrillation: Secondary | ICD-10-CM | POA: Insufficient documentation

## 2012-12-17 DIAGNOSIS — I359 Nonrheumatic aortic valve disorder, unspecified: Secondary | ICD-10-CM

## 2012-12-17 DIAGNOSIS — E7800 Pure hypercholesterolemia, unspecified: Secondary | ICD-10-CM

## 2012-12-17 DIAGNOSIS — I421 Obstructive hypertrophic cardiomyopathy: Secondary | ICD-10-CM

## 2012-12-17 DIAGNOSIS — I4891 Unspecified atrial fibrillation: Secondary | ICD-10-CM

## 2012-12-17 DIAGNOSIS — I350 Nonrheumatic aortic (valve) stenosis: Secondary | ICD-10-CM

## 2012-12-17 DIAGNOSIS — I351 Nonrheumatic aortic (valve) insufficiency: Secondary | ICD-10-CM | POA: Insufficient documentation

## 2012-12-17 NOTE — Progress Notes (Signed)
Patient ID: AABAN GRIEP, male   DOB: 1945/10/29, 67 y.o.   MRN: [id number]     HPI: Joshua [name] is a 67 y.o. male presents to the office for a one-year cardiology evaluation.  Mr. Pagliuca has a history of known hypertrophic obstructive cardiomyopathy, known coronary artery disease and is status post stenting of his proximal right coronary artery with distal PDA occlusion with left-to-right collaterals as well as stenting of his proximal LAD. His last cardiac catheterization was in 2007 at which time both stents were patent. He also has a history of paroxysmal atrial fibrillation for which she's been on chronic Coumadin therapy and did have an episode of atrial flutter but has been maintaining sinus rhythm on Norvasc therapy.  His last echo Doppler study was in April 2013 which again confirmed severe concentric left ventricular hypertrophy. He had a resting left ventricle outflow track subvalvular gradient of 25 mm. Ejection fraction was hyperdynamic at 70%. He did have significant mitral annular calcification with mild mitral stenosis with mild MR as well as aortic valve sclerosis with mild to moderate aortic insufficiency. His last nuclear perfusion study in April 2012 showed normal perfusion.  His last NMR lipoprotein I was recently done in July 2000 4T by Dr. Christell Constant which showed an LDL particle # 948 calculated LDL 66, HDL 41 total cholesterol 126 and triglycerides 94. Insulin resistance was slightly elevated at 53.  Past Medical History  Diagnosis Date  . Atrial fibrillation     persistent  . Coronary artery disease   . Hypercholesteremia   . Hypercholesteremia   . Iron deficiency anemia, unspecified   . Other B-complex deficiencies   . Hx of adenomatous colonic polyps 04-14-2003    Colonoscopy  . Gastritis 06-10-2006  . Hiatal hernia 06-10-2006    EGD  . Hard of hearing   . Rheumatic fever   . COPD (chronic obstructive pulmonary disease)     PFT 05-05-2010, FEV1 .9 (26%) ratio 60  .  Hypertension   . Hypertrophic obstructive cardiomyopathy     mild subaortic stenosis    Past Surgical History  Procedure Laterality Date  . Coronary angioplasty      2002-2003  . Shoulder surgery    . Cardioversion  May 2012    Dr. [name]    Allergies  Allergen Reactions  . Tetracycline     Current Outpatient Prescriptions  Medication Sig Dispense Refill  . acetaminophen (TYLENOL) 325 MG tablet Take 650 mg by mouth as directed.        Marland Kitchen aspirin 81 MG tablet Take 81 mg by mouth daily.        . Cholecalciferol (VITAMIN D3) 2000 UNITS TABS Take 1 tablet by mouth daily.       . disopyramide (NORPACE CR) 150 MG 12 hr capsule Take 2 capsules (300 mg total) by mouth 2 (two) times daily.  120 capsule  3  . hydrochlorothiazide (HYDRODIURIL) 25 MG tablet Take 1 tablet (25 mg total) by mouth daily.  90 tablet  3  . omeprazole (PRILOSEC) 20 MG capsule Take 1 capsule (20 mg total) by mouth daily.  30 capsule  0  . rosuvastatin (CRESTOR) 10 MG tablet Take 1 tablet (10 mg total) by mouth daily.  30 tablet  5  . verapamil (CALAN) 120 MG tablet Take 0.5 tablets by mouth daily.      Marland Kitchen warfarin (COUMADIN) 5 MG tablet TAKE ONE & ONE-HALF TABLETS BY MOUTH EVERY DAY AS DIRECTED  135 tablet  3   Current Facility-Administered Medications  Medication Dose Route Frequency Provider Last Rate Last Dose  . cyanocobalamin ((VITAMIN B-12)) injection 1,000 mcg  1,000 mcg Intramuscular Q30 days Ernestina Penna, MD   1,000 mcg at 10/21/12 0955  . cyanocobalamin ((VITAMIN B-12)) injection 1,000 mcg  1,000 mcg Intramuscular Q30 days Ernestina Penna, MD   1,000 mcg at 12/02/12 0957    History   Social History  . Marital Status: Widowed    Spouse Name: N/A    Number of Children: 5  . Years of Education: N/A   Occupational History  . retired Patent examiner    Social History Main Topics  . Smoking status: Former Smoker -- 1.00 packs/day for 10 years    Types: Cigarettes    Quit date: 02/26/1985  .  Smokeless tobacco: Never Used  . Alcohol Use: No  . Drug Use: No  . Sexual Activity: Not on file   Other Topics Concern  . Not on file   Social History Narrative   Lives in [facility]   Married with two sons and three daughters    Family History  Problem Relation Age of Onset  . Colon cancer Paternal Grandfather   . Heart attack Brother 30    Ventircular rupture  . Emphysema Brother   . Lung cancer Brother   . Heart disease Mother     Enlarged heart   Socially he tries to remain active. He does kayak. He also does work in his yard. He is widowed. He has 4 children one deceased. One grandchild. Is no tobacco or alcohol use.  ROS is negative for fevers, chills or night sweats.   He denies visual symptoms. There is only mild shortness of breath with activity. He denies chest pressure or angina. He denies presyncope unless he stands up very abruptly. There is no frank syncope. He denies cough or sputum production. He denies change in bowel or bladder habits. Does admit to difficulty in losing his abdominal girth. He does have brawny stasis changes to his lower extremities which are chronic. He denies paresthesias. There is no known diabetes. Other comprehensive 12 point system review is negative.  PE BP 150/80  Pulse 61  Ht 5' 8.5" (1.74 m)  Wt 209 lb 1.6 oz (94.847 kg)  BMI 31.33 kg/m2  General: Alert, oriented, no distress.  Skin: normal turgor, no rashes HEENT: Normocephalic, atraumatic. Pupils round and reactive; sclera anicteric;no lid lag.  Nose without nasal septal hypertrophy Mouth/Parynx benign; Mallinpatti scale 3 Neck: No JVD, no carotid briuts Lungs: clear to ausculatation and percussion; no wheezing or rales Heart: RRR, s1 s2 normal 2/6 systolic murmur in the aortic region and a 1/6 diastolic murmur compatible with his aortic insufficiency. He does have mild prescriptions pulse with murmurs transmitted to his carotids. Abdomen: Moderate central adiposity;  soft, nontender; no hepatosplenomehaly, BS+; abdominal aorta nontender and not dilated by palpation. Pulses 2+ Extremities: Brawny stasis changes which are old without frank edema presently; no clubbing cyanosis, Homan's sign negative  Neurologic: grossly nonfocal Psychologic: normal affect and mood.  ECG: Sinus rhythm with first-degree AV block with PR interval 232 ms. QT interval 495 ms.  LABS:  BMET    Component Value Date/Time   NA 139 08/20/2012 0930   K 4.6 08/20/2012 0930   CL 103 08/20/2012 0930   CO2 25 08/20/2012 0930   GLUCOSE 77 08/20/2012 0930   BUN 25* 08/20/2012 0930   CREATININE 1.10 08/20/2012 0930  CREATININE 1.02 07/06/2010 0439   CALCIUM 8.8 08/20/2012 0930   GFRNONAA >60 07/06/2010 0439   GFRAA  Value: >60        The eGFR has been calculated using the MDRD equation. This calculation has not been validated in all clinical situations. eGFR's persistently <60 mL/min signify possible Chronic Kidney Disease. 07/06/2010 0439     Hepatic Function Panel     Component Value Date/Time   PROT 7.3 08/20/2012 0930   ALBUMIN 3.6 08/20/2012 0930   AST 26 08/20/2012 0930   ALT 21 08/20/2012 0930   ALKPHOS 55 08/20/2012 0930   BILITOT 0.8 08/20/2012 0930   BILIDIR 0.1 08/20/2012 0930   IBILI 0.7 08/20/2012 0930     CBC    Component Value Date/Time   WBC 6.5 08/20/2012 0958   WBC 5.4 07/03/2010 1528   RBC 5.0 08/20/2012 0958   RBC 4.43 07/03/2010 1528   HGB 16.4 08/20/2012 0958   HGB 12.2* 07/03/2010 1528   HCT 46.6 08/20/2012 0958   HCT 39.2 07/03/2010 1528   PLT 148* 07/03/2010 1528   MCV 94.3 08/20/2012 0958   MCV 88.5 07/03/2010 1528   MCH 33.2* 08/20/2012 0958   MCH 27.5 07/03/2010 1528   MCHC 35.2 08/20/2012 0958   MCHC 31.1 07/03/2010 1528   RDW 14.3 07/03/2010 1528   LYMPHSABS 1.2 03/31/2010 1739   MONOABS 0.1 03/31/2010 1739   EOSABS 0.2 03/31/2010 1739   BASOSABS 0.0 03/31/2010 1739     BNP    Component Value Date/Time   PROBNP 541.0* 05/05/2010 1158    Lipid Panel     Component  Value Date/Time   TRIG 94 08/20/2012 0930   LDLCALC 66 08/20/2012 0930     RADIOLOGY: No results found.    ASSESSMENT AND PLAN: Mr. Torrez Renfroe has a history of hypertrophic obstructive cardiomyopathy. I did following him for approximately 20 years. He also is status post two-vessel coronary intervention and at last catheterization a patent stents in his LAD and proximal right coronary artery. He is maintaining sinus rhythm on his current dose of Norpace. He also is on very low-dose verapamil. His blood pressure today is controlled when taken by me was 130/70. His last echo Doppler study was in April 2013. His last nuclear perfusion study was in April 2013 which continued to show fairly normal perfusion. In April 2015 I am scheduling him for two-year followup echo Doppler study to reassess the severity of his hypertrophic obstructive myopathy, aortic valve as well as mitral valve disease. I will see him back in the office in followup and further recommendations will be made at that time.     Lennette Bihari, MD, [facility]  12/17/2012 10:36 AM

## 2012-12-17 NOTE — Patient Instructions (Addendum)
Your physician recommends that you schedule a follow-up appointment in: 6 MONTHS.Your physician has requested that you have an echocardiogram. Echocardiography is a painless test that uses sound waves to create images of your heart. It provides your doctor with information about the size and shape of your heart and how well your heart's chambers and valves are working. This procedure takes approximately one hour. There are no restrictions for this procedure. This will be done in April 2015.

## 2012-12-18 ENCOUNTER — Encounter: Payer: Self-pay | Admitting: Cardiovascular Disease

## 2012-12-19 ENCOUNTER — Encounter: Payer: Self-pay | Admitting: Gastroenterology

## 2012-12-19 ENCOUNTER — Ambulatory Visit (INDEPENDENT_AMBULATORY_CARE_PROVIDER_SITE_OTHER): Payer: Medicare Other | Admitting: Gastroenterology

## 2012-12-19 VITALS — BP 122/60 | HR 55 | Ht 67.32 in | Wt 208.0 lb

## 2012-12-19 DIAGNOSIS — Z48812 Encounter for surgical aftercare following surgery on the circulatory system: Secondary | ICD-10-CM

## 2012-12-19 DIAGNOSIS — K219 Gastro-esophageal reflux disease without esophagitis: Secondary | ICD-10-CM

## 2012-12-19 DIAGNOSIS — Z860100 Personal history of colon polyps, unspecified: Secondary | ICD-10-CM

## 2012-12-19 DIAGNOSIS — I4891 Unspecified atrial fibrillation: Secondary | ICD-10-CM

## 2012-12-19 DIAGNOSIS — Z7901 Long term (current) use of anticoagulants: Secondary | ICD-10-CM

## 2012-12-19 MED ORDER — OMEPRAZOLE 20 MG PO CPDR: 20.0000 mg | DELAYED_RELEASE_CAPSULE | Freq: Every day | ORAL | Status: DC

## 2012-12-19 NOTE — Patient Instructions (Signed)
Please follow up in one year   New prescription for Omeprazole was sent to your pharmacy

## 2012-12-19 NOTE — Progress Notes (Signed)
This is a very pleasant 67 year old retiree who has acid reflux which is managed with daily Prilosec 20 mg.  He denies any chest pain, reflux symptoms or dysphagia.  He is up-to-date with colonoscopies and denies melena or hematochezia.  She's had several colon polyps removed in the past.  Septae is good his weight is stable.  He is chronically anticoagulated and has multiple cardiovascular issues.  Current Medications, Allergies, Past Medical History, Past Surgical History, Family History and Social History were reviewed in [employer] record.  ROS: All systems were reviewed and are negative unless otherwise stated in the HPI.          Physical Exam: Blood pressure 122/60, pulse 55 and regular weight 208 pounds with a BMI of 32.27.  Chest is clear and cardiac exam unremarkable irregular rhythm and he has known atrial fib.  I cannot appreciate hepatosplenomegaly, abdominal masses or tenderness.  Bowel sounds are normal.  Mental status is normal    Assessment and Plan: Chronic GERD with previous endoscopies showing no evidence of Barrett's mucosa.  He has no dysphagia or written flank symptoms of chronic GERD.  I've renewed his Prilosec and reviewed her reflux regime with him.  He does not need yearly Hemoccult cards but will need followup colonoscopy in 5 years time as per clinical protocol.   Please copy her primary care physician, referring physician, and pertinent subspecialists.

## 2013-01-20 ENCOUNTER — Ambulatory Visit (INDEPENDENT_AMBULATORY_CARE_PROVIDER_SITE_OTHER): Payer: Medicare Other | Admitting: Pharmacist Clinician (PhC)/ Clinical Pharmacy Specialist

## 2013-01-20 DIAGNOSIS — E538 Deficiency of other specified B group vitamins: Secondary | ICD-10-CM

## 2013-01-20 DIAGNOSIS — I4891 Unspecified atrial fibrillation: Secondary | ICD-10-CM

## 2013-01-20 LAB — POCT INR: INR: 2.7

## 2013-01-29 ENCOUNTER — Ambulatory Visit (INDEPENDENT_AMBULATORY_CARE_PROVIDER_SITE_OTHER): Payer: Medicare Other | Admitting: Family Medicine

## 2013-01-29 ENCOUNTER — Ambulatory Visit (INDEPENDENT_AMBULATORY_CARE_PROVIDER_SITE_OTHER): Payer: Medicare Other

## 2013-01-29 ENCOUNTER — Encounter: Payer: Self-pay | Admitting: Family Medicine

## 2013-01-29 VITALS — BP 127/69 | HR 52 | Temp 96.9°F | Ht 67.25 in | Wt 205.0 lb

## 2013-01-29 DIAGNOSIS — N400 Enlarged prostate without lower urinary tract symptoms: Secondary | ICD-10-CM

## 2013-01-29 DIAGNOSIS — I4891 Unspecified atrial fibrillation: Secondary | ICD-10-CM

## 2013-01-29 DIAGNOSIS — K449 Diaphragmatic hernia without obstruction or gangrene: Secondary | ICD-10-CM

## 2013-01-29 DIAGNOSIS — J449 Chronic obstructive pulmonary disease, unspecified: Secondary | ICD-10-CM

## 2013-01-29 DIAGNOSIS — Z23 Encounter for immunization: Secondary | ICD-10-CM

## 2013-01-29 DIAGNOSIS — I421 Obstructive hypertrophic cardiomyopathy: Secondary | ICD-10-CM

## 2013-01-29 DIAGNOSIS — I2510 Atherosclerotic heart disease of native coronary artery without angina pectoris: Secondary | ICD-10-CM

## 2013-01-29 DIAGNOSIS — Z800 Family history of malignant neoplasm of digestive organs: Secondary | ICD-10-CM | POA: Insufficient documentation

## 2013-01-29 DIAGNOSIS — J4489 Other specified chronic obstructive pulmonary disease: Secondary | ICD-10-CM

## 2013-01-29 DIAGNOSIS — E559 Vitamin D deficiency, unspecified: Secondary | ICD-10-CM

## 2013-01-29 DIAGNOSIS — E7800 Pure hypercholesterolemia, unspecified: Secondary | ICD-10-CM

## 2013-01-29 DIAGNOSIS — D509 Iron deficiency anemia, unspecified: Secondary | ICD-10-CM

## 2013-01-29 DIAGNOSIS — Z0000 Encounter for general adult medical examination without abnormal findings: Secondary | ICD-10-CM

## 2013-01-29 DIAGNOSIS — Z87448 Personal history of other diseases of urinary system: Secondary | ICD-10-CM | POA: Insufficient documentation

## 2013-01-29 LAB — POCT UA - MICROSCOPIC ONLY
Bacteria, U Microscopic: NEGATIVE
Casts, Ur, LPF, POC: NEGATIVE
Crystals, Ur, HPF, POC: NEGATIVE
RBC, urine, microscopic: NEGATIVE
WBC, Ur, HPF, POC: NEGATIVE
Yeast, UA: NEGATIVE

## 2013-01-29 LAB — POCT CBC
Granulocyte percent: 72.4 %G (ref 37–80)
HCT, POC: 48.9 % (ref 43.5–53.7)
Hemoglobin: 15.9 g/dL (ref 14.1–18.1)
Lymph, poc: 1.4 (ref 0.6–3.4)
MCH, POC: 30.3 pg (ref 27–31.2)
MCHC: 32.6 g/dL (ref 31.8–35.4)
MCV: 92.9 fL (ref 80–97)
MPV: 6.3 fL (ref 0–99.8)
POC Granulocyte: 4.3 (ref 2–6.9)
POC LYMPH PERCENT: 23.8 %L (ref 10–50)
Platelet Count, POC: 142 10*3/uL (ref 142–424)
RBC: 5.3 M/uL (ref 4.69–6.13)
RDW, POC: 14 %
WBC: 6 10*3/uL (ref 4.6–10.2)

## 2013-01-29 LAB — POCT URINALYSIS DIPSTICK
Bilirubin, UA: NEGATIVE
Blood, UA: NEGATIVE
Glucose, UA: NEGATIVE
Ketones, UA: NEGATIVE
Leukocytes, UA: NEGATIVE
Nitrite, UA: NEGATIVE
Protein, UA: 100
Spec Grav, UA: >=1.03
Urobilinogen, UA: NEGATIVE
pH, UA: 6

## 2013-01-29 NOTE — Patient Instructions (Addendum)
Continue current medications. Continue good therapeutic lifestyle changes which include good diet and exercise. Fall precautions discussed with patient. Schedule your flu vaccine if you haven't had it yet If you are over 68 years old - you may need Prevnar 13 or the adult Pneumonia vaccine. Regular visits with the gastroenterologist and cardiologist. Use a coolmist humidifier in her bedroom at nighttime Drink plenty of fluids Use earwax softener for the right ear canal cerumen Watch sodium intake

## 2013-01-29 NOTE — Progress Notes (Signed)
Subjective:    Patient ID: Joshua [name], male    DOB: 1945/03/12, 67 y.o.   MRN: [id number]  HPI Pt here for follow up and management of chronic medical problems. Patient recently saw his cardiologist and everything was going well from that standpoint. He is scheduled for an echocardiogram in about 6 months at the next visit. The patient describes hitting his right forearm and receiving a large hematoma about 3 weeks ago which is gradually resolving. He has no other complaints other than dry skin.      Patient Active Problem List   Diagnosis Date Noted  . PAF (paroxysmal atrial fibrillation) 12/17/2012  . Aortic valve insufficiency 12/17/2012  . Mild mitral stenosis 12/17/2012  . Pulmonary infiltrates 07/17/2010  . COPD UNSPECIFIED 05/05/2010  . HYPERTROPHIC OBSTRUCTIVE CARDIOMYOPATHY 03/31/2010  . ATRIAL FLUTTER 03/31/2010  . HEMOCCULT POSITIVE STOOL 10/12/2008  . B12 DEFICIENCY 10/11/2008  . HYPERCHOLESTEROLEMIA 10/11/2008  . ANEMIA, IRON DEFICIENCY 10/11/2008  . CORONARY ARTERY DISEASE 10/11/2008  . FIBRILLATION, ATRIAL 10/11/2008  . GASTRITIS 10/11/2008  . HIATAL HERNIA WITH REFLUX 10/11/2008  . COLONIC POLYPS, ADENOMATOUS, HX OF 10/11/2008   Outpatient Encounter Prescriptions as of 01/29/2013  Medication Sig  . aspirin 81 MG tablet Take 81 mg by mouth daily.    . Cholecalciferol (VITAMIN D3) 2000 UNITS TABS Take 1 tablet by mouth daily.   . disopyramide (NORPACE CR) 150 MG 12 hr capsule Take 2 capsules (300 mg total) by mouth 2 (two) times daily.  . hydrochlorothiazide (HYDRODIURIL) 25 MG tablet Take 1 tablet (25 mg total) by mouth daily.  Marland Kitchen omeprazole (PRILOSEC) 20 MG capsule Take 1 capsule (20 mg total) by mouth daily.  . rosuvastatin (CRESTOR) 10 MG tablet Take 1 tablet (10 mg total) by mouth daily.  . verapamil (CALAN) 120 MG tablet Take 0.5 tablets by mouth daily. Takes 60 mg daily  . warfarin (COUMADIN) 5 MG tablet TAKE ONE & ONE-HALF TABLETS BY MOUTH EVERY DAY AS  DIRECTED  . acetaminophen (TYLENOL) 325 MG tablet Take 650 mg by mouth as directed.      Review of Systems  Constitutional: Negative.   HENT: Negative.   Eyes: Negative.   Respiratory: Negative.   Cardiovascular: Negative.   Gastrointestinal: Negative.   Endocrine: Negative.   Genitourinary: Negative.   Musculoskeletal: Negative.   Skin: Negative.        Knot on right lower arm x 3 weeks  Allergic/Immunologic: Negative.   Neurological: Negative.   Hematological: Negative.   Psychiatric/Behavioral: Negative.        Objective:   Physical Exam  Nursing note and vitals reviewed. Constitutional: He is oriented to person, place, and time. He appears well-developed and well-nourished. No distress.  HENT:  Head: Normocephalic and atraumatic.  Right Ear: External ear normal.  Left Ear: External ear normal.  Nose: Nose normal.  Mouth/Throat: Oropharynx is clear and moist. No oropharyngeal exudate.  Ear cerumen right EAC and bilateral hearing aids  Eyes: Conjunctivae and EOM are normal. Pupils are equal, round, and reactive to light. Right eye exhibits no discharge. Left eye exhibits no discharge. No scleral icterus.  Neck: Normal range of motion. Neck supple. No tracheal deviation present. No thyromegaly present.  No carotid bruits heard  Cardiovascular: Normal rate, regular rhythm and intact distal pulses.  Exam reveals no gallop and no friction rub.   Murmur (a grade 1/6 systolic ejection murmur) heard. At 60 per minute Patient has discolored lower extremities with a lot of varicosities.  He did however have good pulses.  Pulmonary/Chest: Effort normal and breath sounds normal. No respiratory distress. He has no wheezes. He has no rales. He exhibits no tenderness.  No axillary nodes  Abdominal: Soft. Bowel sounds are normal. He exhibits no mass. There is no tenderness. There is no rebound and no guarding.  Obesity, no inguinal node  Genitourinary: Rectum normal and penis normal.    Prostate is enlarged but smooth without any lumps or masses. There are no rectal masses. The external genitalia were normal. And there was no inguinal hernia.  Musculoskeletal: Normal range of motion. He exhibits no edema and no tenderness.  Hematoma resolving right proximal forearm  Lymphadenopathy:    He has no cervical adenopathy.  Neurological: He is alert and oriented to person, place, and time. He has normal reflexes. No cranial nerve deficit.  Skin: Skin is warm and dry. No rash noted. No erythema. No pallor.  Psychiatric: He has a normal mood and affect. His behavior is normal. Judgment and thought content normal.   BP 127/69  Pulse 52  Temp(Src) 96.9 F (36.1 C) (Oral)  Ht 5' 7.25" (1.708 m)  Wt 205 lb (92.987 kg)  BMI 31.87 kg/m2 [facility] reading (PRIMARY) by  Dr.Kora Groom: Chest x-ray-  chronic changes with pleural scarring and flat diaphragm                                   Assessment & Plan:   1. COPD UNSPECIFIED - POCT CBC - DG Chest 2 View; Future  2. FIBRILLATION, ATRIAL - POCT CBC - DG Chest 2 View; Future  3. CORONARY ARTERY DISEASE - POCT CBC - Hepatic function panel - BMP8+EGFR - NMR, lipoprofile - DG Chest 2 View; Future  4. HYPERCHOLESTEROLEMIA - POCT CBC - NMR, lipoprofile  5. Annual physical exam - POCT CBC - POCT urinalysis dipstick - POCT UA - Microscopic Only - Hepatic function panel - BMP8+EGFR - NMR, lipoprofile - PSA, total and free - Vit D  25 hydroxy (rtn osteoporosis monitoring)  6. Vitamin D deficiency - Vit D  25 hydroxy (rtn osteoporosis monitoring)  7. HYPERTROPHIC OBSTRUCTIVE CARDIOMYOPATHY  8. HIATAL HERNIA WITH REFLUX  9. ANEMIA, IRON DEFICIENCY  10. BPH (benign prostatic hyperplasia)  11. History of hematuria  12. Family history of colon cancer  No orders of the defined types were placed in this encounter.   Prevnar was given to the patient today  Patient Instructions  Continue current medications. Continue  good therapeutic lifestyle changes which include good diet and exercise. Fall precautions discussed with patient. Schedule your flu vaccine if you haven't had it yet If you are over 50 years old - you may need Prevnar 13 or the adult Pneumonia vaccine. Regular visits with the gastroenterologist and cardiologist. Use a coolmist humidifier in her bedroom at nighttime Drink plenty of fluids Use earwax softener for the right ear canal cerumen Watch sodium intake   Nyra Capes MD

## 2013-01-30 ENCOUNTER — Telehealth: Payer: Self-pay

## 2013-01-30 NOTE — Telephone Encounter (Signed)
[facility] [phone] 

## 2013-01-30 NOTE — Telephone Encounter (Signed)
Message copied by Roselee Culver on Fri Jan 30, 2013  4:33 PM ------      Message from: Ernestina Penna      Created: Thu Jan 29, 2013  1:07 PM       Patient is feeling well and asymptomatic. Pleural thickening and scarring is present. X-ray appears very similar to a previous one. ------

## 2013-01-31 LAB — NMR, LIPOPROFILE
Cholesterol: 137 mg/dL (ref <200)
HDL Cholesterol by NMR: 45 mg/dL (ref >=40)
HDL Particle Number: 34 umol/L (ref >=30.5)
LDL Particle Number: 1087 nmol/L — ABNORMAL HIGH (ref <1000)
LDL Size: 20.8 nm (ref >20.5)
LDLC SERPL CALC-MCNC: 64 mg/dL (ref <100)
LP-IR Score: 61 — ABNORMAL HIGH (ref <=45)
Small LDL Particle Number: 491 nmol/L (ref <=527)
Triglycerides by NMR: 141 mg/dL (ref <150)

## 2013-01-31 LAB — HEPATIC FUNCTION PANEL
ALT: 27 IU/L (ref 0–44)
AST: 29 IU/L (ref 0–40)
Albumin: 3.8 g/dL (ref 3.6–4.8)
Alkaline Phosphatase: 74 IU/L (ref 39–117)
Bilirubin, Direct: 0.16 mg/dL (ref 0.00–0.40)
Total Bilirubin: 0.4 mg/dL (ref 0.0–1.2)
Total Protein: 6.9 g/dL (ref 6.0–8.5)

## 2013-01-31 LAB — VITAMIN D 25 HYDROXY (VIT D DEFICIENCY, FRACTURES): Vit D, 25-Hydroxy: 57.3 ng/mL (ref 30.0–100.0)

## 2013-01-31 LAB — PSA, TOTAL AND FREE
PSA, Free Pct: 31.7 %
PSA, Free: 0.19 ng/mL
PSA: 0.6 ng/mL (ref 0.0–4.0)

## 2013-01-31 LAB — BMP8+EGFR
BUN/Creatinine Ratio: 17 (ref 10–22)
BUN: 20 mg/dL (ref 8–27)
CO2: 29 mmol/L (ref 18–29)
Calcium: 8.9 mg/dL (ref 8.6–10.2)
Chloride: 95 mmol/L — ABNORMAL LOW (ref 97–108)
Creatinine, Ser: 1.17 mg/dL (ref 0.76–1.27)
GFR calc Af Amer: 74 mL/min/{1.73_m2} (ref >59)
GFR calc non Af Amer: 64 mL/min/{1.73_m2} (ref >59)
Glucose: 72 mg/dL (ref 65–99)
Potassium: 4.2 mmol/L (ref 3.5–5.2)
Sodium: 140 mmol/L (ref 134–144)

## 2013-02-14 ENCOUNTER — Other Ambulatory Visit: Payer: Self-pay | Admitting: Family Medicine

## 2013-02-16 ENCOUNTER — Telehealth: Payer: Self-pay | Admitting: *Deleted

## 2013-02-16 NOTE — Telephone Encounter (Signed)
Pt.notified

## 2013-02-16 NOTE — Telephone Encounter (Signed)
Message copied by Baltazar Apo on Mon Feb 16, 2013  2:55 PM ------      Message from: Ernestina Penna      Created: Thu Jan 29, 2013  1:12 PM       The CBC is within normal limits, with a white blood cell count that is not elevated and a hemoglobin that is good and a platelet That is adequate. The urinalysis is also clear with no sign of any infection. ------

## 2013-02-22 ENCOUNTER — Other Ambulatory Visit: Payer: Self-pay | Admitting: Cardiovascular Disease

## 2013-02-23 ENCOUNTER — Other Ambulatory Visit: Payer: Self-pay | Admitting: *Deleted

## 2013-02-23 MED ORDER — DISOPYRAMIDE PHOSPHATE ER 150 MG PO CP12: 300.0000 mg | ORAL_CAPSULE | Freq: Two times a day (BID) | ORAL | Status: DC

## 2013-02-27 ENCOUNTER — Encounter: Payer: Self-pay | Admitting: General Practice

## 2013-02-27 ENCOUNTER — Ambulatory Visit (INDEPENDENT_AMBULATORY_CARE_PROVIDER_SITE_OTHER): Payer: Medicare Other | Admitting: General Practice

## 2013-02-27 VITALS — BP 157/68 | HR 82 | Temp 98.8°F | Wt 208.0 lb

## 2013-02-27 DIAGNOSIS — J020 Streptococcal pharyngitis: Secondary | ICD-10-CM

## 2013-02-27 DIAGNOSIS — R509 Fever, unspecified: Secondary | ICD-10-CM

## 2013-02-27 LAB — POCT INFLUENZA A/B
Influenza A, POC: NEGATIVE
Influenza B, POC: NEGATIVE

## 2013-02-27 LAB — POCT RAPID STREP A (OFFICE): Rapid Strep A Screen: POSITIVE — AB

## 2013-02-27 MED ORDER — AMOXICILLIN 875 MG PO TABS: 875.0000 mg | ORAL_TABLET | Freq: Two times a day (BID) | ORAL | Status: DC

## 2013-02-27 NOTE — Patient Instructions (Signed)
Strep Throat  Strep throat is an infection of the throat caused by a bacteria named Streptococcus pyogenes. Your caregiver may call the infection streptococcal "tonsillitis" or "pharyngitis" depending on whether there are signs of inflammation in the tonsils or back of the throat. Strep throat is most common in children aged 69 15 years during the cold months of the year, but it can occur in people of any age during any season. This infection is spread from person to person (contagious) through coughing, sneezing, or other close contact.  SYMPTOMS   · Fever or chills.  · Painful, swollen, red tonsils or throat.  · Pain or difficulty when swallowing.  · White or yellow spots on the tonsils or throat.  · Swollen, tender lymph nodes or "glands" of the neck or under the jaw.  · Red rash all over the body (rare).  DIAGNOSIS   Many different infections can cause the same symptoms. A test must be done to confirm the diagnosis so the right treatment can be given. A "rapid strep test" can help your caregiver make the diagnosis in a few minutes. If this test is not available, a light swab of the infected area can be used for a throat culture test. If a throat culture test is done, results are usually available in a day or two.  TREATMENT   Strep throat is treated with antibiotic medicine.  HOME CARE INSTRUCTIONS   · Gargle with 1 tsp of salt in 1 cup of warm water, 3 4 times per day or as needed for comfort.  · Family members who also have a sore throat or fever should be tested for strep throat and treated with antibiotics if they have the strep infection.  · Make sure everyone in your household washes their hands well.  · Do not share food, drinking cups, or personal items that could cause the infection to spread to others.  · You may need to eat a soft food diet until your sore throat gets better.  · Drink enough water and fluids to keep your urine clear or pale yellow. This will help prevent dehydration.  · Get plenty of  rest.  · Stay home from school, daycare, or work until you have been on antibiotics for 24 hours.  · Only take over-the-counter or prescription medicines for pain, discomfort, or fever as directed by your caregiver.  · If antibiotics are prescribed, take them as directed. Finish them even if you start to feel better.  SEEK MEDICAL CARE IF:   · The glands in your neck continue to enlarge.  · You develop a rash, cough, or earache.  · You cough up green, yellow-brown, or bloody sputum.  · You have pain or discomfort not controlled by medicines.  · Your problems seem to be getting worse rather than better.  SEEK IMMEDIATE MEDICAL CARE IF:   · You develop any new symptoms such as vomiting, severe headache, stiff or painful neck, chest pain, shortness of breath, or trouble swallowing.  · You develop severe throat pain, drooling, or changes in your voice.  · You develop swelling of the neck, or the skin on the neck becomes red and tender.  · You have a fever.  · You develop signs of dehydration, such as fatigue, dry mouth, and decreased urination.  · You become increasingly sleepy, or you cannot wake up completely.  Document Released: 02/10/2000 Document Revised: 01/30/2012 Document Reviewed: 04/13/2010  [employer]® Patient Information ©2014 [employer].

## 2013-02-27 NOTE — Progress Notes (Signed)
   Subjective:    Patient ID: Joshua [name], male    DOB: 08/24/1945, 68 y.o.   MRN: [id number]  Sore Throat  This is a new problem. The current episode started in the past 7 days. The problem has been gradually worsening. Neither side of throat is experiencing more pain than the other. The maximum temperature recorded prior to his arrival was 100 - 100.9 F. The pain is at a severity of 6/10. The pain is moderate. Associated symptoms include coughing. Pertinent negatives include no congestion, diarrhea, hoarse voice, shortness of breath or vomiting. He has had no exposure to strep or mono. He has tried acetaminophen for the symptoms. The treatment provided mild relief.      Review of Systems  Constitutional: Positive for fever. Negative for chills.  HENT: Positive for sore throat. Negative for congestion, hoarse voice, postnasal drip and sinus pressure.   Respiratory: Positive for cough. Negative for chest tightness and shortness of breath.   Cardiovascular: Negative for chest pain and palpitations.  Gastrointestinal: Negative for vomiting and diarrhea.       Objective:   Physical Exam  Constitutional: He is oriented to person, place, and time. He appears well-developed and well-nourished.  HENT:  Head: Normocephalic and atraumatic.  Right Ear: External ear normal.  Left Ear: External ear normal.  Mouth/Throat: Posterior oropharyngeal erythema present.  Cardiovascular: Normal rate, regular rhythm and normal heart sounds.   Pulmonary/Chest: Effort normal and breath sounds normal. No respiratory distress. He exhibits no tenderness.  Neurological: He is alert and oriented to person, place, and time.  Skin: Skin is warm and dry.  Psychiatric: He has a normal mood and affect.     Results for orders placed in visit on 02/27/13  POCT RAPID STREP A (OFFICE)      Result Value Range   Rapid Strep A Screen Positive (*) Negative  POCT INFLUENZA A/B      Result Value Range   Influenza A,  POC Negative     Influenza B, POC Negative          Assessment & Plan:  1. Fever  - POCT rapid strep A - POCT Influenza A/B  2. Strep throat  - amoxicillin (AMOXIL) 875 MG tablet; Take 1 tablet (875 mg total) by mouth 2 (two) times daily.  Dispense: 20 tablet; Refill: 0 -Increase fluid intake Motrin or tylenol OTC Throat lozenges if help New toothbrush in 3 days Proper handwashing Patient verbalized understanding Erby Pian, FNP-C

## 2013-03-02 ENCOUNTER — Other Ambulatory Visit: Payer: Self-pay | Admitting: General Practice

## 2013-03-03 ENCOUNTER — Ambulatory Visit (INDEPENDENT_AMBULATORY_CARE_PROVIDER_SITE_OTHER): Payer: Medicare Other | Admitting: Pharmacist Clinician (PhC)/ Clinical Pharmacy Specialist

## 2013-03-03 VITALS — Ht 68.5 in | Wt 205.0 lb

## 2013-03-03 DIAGNOSIS — E538 Deficiency of other specified B group vitamins: Secondary | ICD-10-CM

## 2013-03-03 DIAGNOSIS — I4891 Unspecified atrial fibrillation: Secondary | ICD-10-CM

## 2013-03-03 LAB — POCT INR: INR: 1.8

## 2013-03-12 ENCOUNTER — Encounter: Payer: Self-pay | Admitting: Family Medicine

## 2013-03-12 ENCOUNTER — Ambulatory Visit (INDEPENDENT_AMBULATORY_CARE_PROVIDER_SITE_OTHER): Payer: Medicare Other | Admitting: Family Medicine

## 2013-03-12 VITALS — BP 115/57 | HR 65 | Temp 97.2°F | Ht 67.25 in | Wt 207.0 lb

## 2013-03-12 DIAGNOSIS — I421 Obstructive hypertrophic cardiomyopathy: Secondary | ICD-10-CM

## 2013-03-12 DIAGNOSIS — I359 Nonrheumatic aortic valve disorder, unspecified: Secondary | ICD-10-CM

## 2013-03-12 DIAGNOSIS — I480 Paroxysmal atrial fibrillation: Secondary | ICD-10-CM

## 2013-03-12 DIAGNOSIS — I351 Nonrheumatic aortic (valve) insufficiency: Secondary | ICD-10-CM

## 2013-03-12 DIAGNOSIS — I4891 Unspecified atrial fibrillation: Secondary | ICD-10-CM

## 2013-03-12 NOTE — Patient Instructions (Signed)
Continue current medications.  Follow up as planned.

## 2013-03-12 NOTE — Progress Notes (Signed)
Subjective:    Patient ID: Joshua [name], male    DOB: 08/24/1945, 68 y.o.   MRN: [id number]  HPI Pt here for [facility] paperwork for atrial fib. Patient comes in today with no complaints except for paperwork for the [facility]. He's been doing well with his heart is on chronic Coumadin therapy for valvular insufficiency and paroxysmal atrial fibrillation. He also has a history of hypertrophic obstructive cardiomyopathy. This patient has been stable and well controlled for several years. He sees a cardiologist regularly. The patient is doing well with no complaints no chest pain no shortness of breath and no edema. Patient Active Problem List   Diagnosis Date Noted  . BPH (benign prostatic hyperplasia) 01/29/2013  . History of hematuria 01/29/2013  . Family history of colon cancer 01/29/2013  . PAF (paroxysmal atrial fibrillation) 12/17/2012  . Aortic valve insufficiency 12/17/2012  . Mild mitral stenosis 12/17/2012  . Pulmonary infiltrates 07/17/2010  . COPD UNSPECIFIED 05/05/2010  . HYPERTROPHIC OBSTRUCTIVE CARDIOMYOPATHY 03/31/2010  . ATRIAL FLUTTER 03/31/2010  . HEMOCCULT POSITIVE STOOL 10/12/2008  . B12 DEFICIENCY 10/11/2008  . HYPERCHOLESTEROLEMIA 10/11/2008  . ANEMIA, IRON DEFICIENCY 10/11/2008  . CORONARY ARTERY DISEASE 10/11/2008  . FIBRILLATION, ATRIAL 10/11/2008  . GASTRITIS 10/11/2008  . HIATAL HERNIA WITH REFLUX 10/11/2008  . COLONIC POLYPS, ADENOMATOUS, HX OF 10/11/2008   Outpatient Encounter Prescriptions as of 03/12/2013  Medication Sig  . acetaminophen (TYLENOL) 325 MG tablet Take 650 mg by mouth as directed.    Marland Kitchen aspirin 81 MG tablet Take 81 mg by mouth daily.    . Cholecalciferol (VITAMIN D3) 2000 UNITS TABS Take 1 tablet by mouth daily.   . CRESTOR 10 MG tablet TAKE ONE TABLET BY MOUTH ONCE DAILY  . disopyramide (NORPACE CR) 150 MG 12 hr capsule Take 2 capsules (300 mg total) by mouth 2 (two) times daily.  . hydrochlorothiazide (HYDRODIURIL) 25 MG tablet Take 1 tablet (25 mg  total) by mouth daily.  Marland Kitchen omeprazole (PRILOSEC) 20 MG capsule Take 1 capsule (20 mg total) by mouth daily.  . verapamil (CALAN) 120 MG tablet Take 0.5 tablets by mouth daily. Takes 60 mg daily  . warfarin (COUMADIN) 5 MG tablet TAKE ONE & ONE-HALF TABLETS BY MOUTH EVERY DAY AS DIRECTED  . [DISCONTINUED] amoxicillin (AMOXIL) 875 MG tablet Take 1 tablet (875 mg total) by mouth 2 (two) times daily.      Review of Systems  Constitutional: Negative.   HENT: Negative.   Eyes: Negative.   Respiratory: Negative.   Cardiovascular: Negative.   Gastrointestinal: Negative.   Endocrine: Negative.   Genitourinary: Negative.   Musculoskeletal: Negative.   Allergic/Immunologic: Negative.   Neurological: Negative.   Hematological: Negative.   Psychiatric/Behavioral: Negative.        Objective:   Physical Exam  Nursing note and vitals reviewed. Constitutional: He is oriented to person, place, and time. He appears well-developed and well-nourished.  HENT:  Head: Normocephalic and atraumatic.  Eyes: Conjunctivae and EOM are normal. Pupils are equal, round, and reactive to light. Right eye exhibits no discharge. Left eye exhibits no discharge. No scleral icterus.  Neck: Normal range of motion. No thyromegaly present.  Cardiovascular: Normal rate and regular rhythm.  Exam reveals no gallop and no friction rub.   Murmur (there is a grade 2/6 systolic ejection murmur) heard. Heart has a regular rate and rhythm at 72 per min  Pulmonary/Chest: Effort normal and breath sounds normal. No respiratory distress. He has no wheezes. He has no rales.  He exhibits no tenderness.  Musculoskeletal: Normal range of motion.  Lymphadenopathy:    He has no cervical adenopathy.  Neurological: He is alert and oriented to person, place, and time.  Skin: Skin is warm and dry. No rash noted.  Psychiatric: He has a normal mood and affect. His behavior is normal. Judgment and thought content normal.    The DMV forms  were completed they will be faxed to the [facility] and a copy will be scanned into the record.      Assessment & Plan:   1. FIBRILLATION, ATRIAL  2. Aortic valve insufficiency  3. PAF (paroxysmal atrial fibrillation)  4. HYPERTROPHIC OBSTRUCTIVE CARDIOMYOPATHY  Patient Instructions  Continue current medications  Follow up as planned   Arrie Senate MD

## 2013-03-20 ENCOUNTER — Other Ambulatory Visit: Payer: Medicare Other

## 2013-03-20 NOTE — Progress Notes (Signed)
Pt came in for labs only 

## 2013-03-22 LAB — FECAL OCCULT BLOOD, IMMUNOCHEMICAL: Fecal Occult Bld: NEGATIVE

## 2013-03-30 ENCOUNTER — Telehealth: Payer: Self-pay | Admitting: Cardiovascular Disease

## 2013-03-30 NOTE — Telephone Encounter (Signed)
His insurance says  His Disopyramide is not covered any more. What does he need to do?

## 2013-03-30 NOTE — Telephone Encounter (Signed)
Patient called.[name] needs prior authorization of disopyrdamide (prescribed by [name] - has eben on since 2011).. Patient will bring prior authorization information by office tomorrow.

## 2013-04-01 ENCOUNTER — Encounter: Payer: Self-pay | Admitting: *Deleted

## 2013-04-13 NOTE — Telephone Encounter (Signed)
Faxed PA to [facility] for patient's disopyramide ( NORPACE).

## 2013-04-15 ENCOUNTER — Ambulatory Visit (INDEPENDENT_AMBULATORY_CARE_PROVIDER_SITE_OTHER): Payer: Medicare Other | Admitting: Pharmacist

## 2013-04-15 DIAGNOSIS — I4891 Unspecified atrial fibrillation: Secondary | ICD-10-CM

## 2013-04-15 DIAGNOSIS — E538 Deficiency of other specified B group vitamins: Secondary | ICD-10-CM

## 2013-04-15 LAB — POCT INR: INR: 2.8

## 2013-04-15 NOTE — Patient Instructions (Signed)
.   Anticoagulation Dose Instructions as of 04/15/2013     Joshua [name] Tue Wed Thu Fri Sat   New Dose 7.5 mg 5 mg 5 mg 5 mg 5 mg 5 mg 5 mg    Description       Continue 1 and 1/2 tablets on Sundays and 1 tablet all other days.      INR was 2.8 today.

## 2013-04-24 ENCOUNTER — Telehealth (HOSPITAL_COMMUNITY): Payer: Self-pay | Admitting: *Deleted

## 2013-05-18 ENCOUNTER — Ambulatory Visit (HOSPITAL_COMMUNITY)
Admission: RE | Admit: 2013-05-18 | Discharge: 2013-05-18 | Disposition: A | Discharge status: Home / Self Care | Payer: Medicare Other | Source: Ambulatory Visit | Attending: Cardiovascular Disease | Admitting: Cardiovascular Disease

## 2013-05-18 DIAGNOSIS — I350 Nonrheumatic aortic (valve) stenosis: Secondary | ICD-10-CM

## 2013-05-18 DIAGNOSIS — I359 Nonrheumatic aortic valve disorder, unspecified: Secondary | ICD-10-CM | POA: Insufficient documentation

## 2013-05-18 DIAGNOSIS — I4891 Unspecified atrial fibrillation: Secondary | ICD-10-CM | POA: Insufficient documentation

## 2013-05-18 NOTE — Progress Notes (Signed)
2D Echo Performed 05/18/2013    Mayetta Castleman, RCS  

## 2013-05-21 ENCOUNTER — Other Ambulatory Visit: Payer: Self-pay | Admitting: Pharmacist

## 2013-05-21 ENCOUNTER — Ambulatory Visit (INDEPENDENT_AMBULATORY_CARE_PROVIDER_SITE_OTHER): Payer: Medicare Other | Admitting: Family Medicine

## 2013-05-21 ENCOUNTER — Encounter: Payer: Self-pay | Admitting: Family Medicine

## 2013-05-21 VITALS — BP 128/76 | HR 58 | Temp 97.5°F | Ht 67.25 in | Wt 209.0 lb

## 2013-05-21 DIAGNOSIS — H6123 Impacted cerumen, bilateral: Secondary | ICD-10-CM

## 2013-05-21 DIAGNOSIS — I351 Nonrheumatic aortic (valve) insufficiency: Secondary | ICD-10-CM

## 2013-05-21 DIAGNOSIS — Z79899 Other long term (current) drug therapy: Secondary | ICD-10-CM

## 2013-05-21 DIAGNOSIS — J309 Allergic rhinitis, unspecified: Secondary | ICD-10-CM

## 2013-05-21 DIAGNOSIS — D519 Vitamin B12 deficiency anemia, unspecified: Secondary | ICD-10-CM

## 2013-05-21 DIAGNOSIS — E7800 Pure hypercholesterolemia, unspecified: Secondary | ICD-10-CM

## 2013-05-21 DIAGNOSIS — I359 Nonrheumatic aortic valve disorder, unspecified: Secondary | ICD-10-CM

## 2013-05-21 DIAGNOSIS — D509 Iron deficiency anemia, unspecified: Secondary | ICD-10-CM

## 2013-05-21 DIAGNOSIS — J449 Chronic obstructive pulmonary disease, unspecified: Secondary | ICD-10-CM

## 2013-05-21 DIAGNOSIS — N400 Enlarged prostate without lower urinary tract symptoms: Secondary | ICD-10-CM

## 2013-05-21 DIAGNOSIS — I4891 Unspecified atrial fibrillation: Secondary | ICD-10-CM

## 2013-05-21 DIAGNOSIS — I421 Obstructive hypertrophic cardiomyopathy: Secondary | ICD-10-CM

## 2013-05-21 DIAGNOSIS — H6120 Impacted cerumen, unspecified ear: Secondary | ICD-10-CM

## 2013-05-21 DIAGNOSIS — I4892 Unspecified atrial flutter: Secondary | ICD-10-CM

## 2013-05-21 DIAGNOSIS — E559 Vitamin D deficiency, unspecified: Secondary | ICD-10-CM

## 2013-05-21 DIAGNOSIS — I480 Paroxysmal atrial fibrillation: Secondary | ICD-10-CM

## 2013-05-21 DIAGNOSIS — E538 Deficiency of other specified B group vitamins: Secondary | ICD-10-CM

## 2013-05-21 DIAGNOSIS — D518 Other vitamin B12 deficiency anemias: Secondary | ICD-10-CM

## 2013-05-21 LAB — POCT CBC
Granulocyte percent: 70.4 %G (ref 37–80)
HCT, POC: 47.4 % (ref 43.5–53.7)
Hemoglobin: 15.3 g/dL (ref 14.1–18.1)
Lymph, poc: 1.4 (ref 0.6–3.4)
MCH, POC: 30.1 pg (ref 27–31.2)
MCHC: 32.3 g/dL (ref 31.8–35.4)
MCV: 93.4 fL (ref 80–97)
MPV: 5.9 fL (ref 0–99.8)
POC Granulocyte: 4.3 (ref 2–6.9)
POC LYMPH PERCENT: 22.7 %L (ref 10–50)
Platelet Count, POC: 150 10*3/uL (ref 142–424)
RBC: 5.1 M/uL (ref 4.69–6.13)
RDW, POC: 13.8 %
WBC: 6.1 10*3/uL (ref 4.6–10.2)

## 2013-05-21 LAB — POCT INR: INR: 2.5

## 2013-05-21 MED ORDER — CYANOCOBALAMIN 1000 MCG/ML IJ SOLN
1000.0000 ug | INTRAMUSCULAR | Status: AC
  Administered 2013-05-21 – 2014-05-11 (×10): 1000 ug via INTRAMUSCULAR

## 2013-05-21 NOTE — Addendum Note (Signed)
Addended by: Zannie Cove on: 05/21/2013 08:51 AM   Modules accepted: Orders

## 2013-05-21 NOTE — Progress Notes (Signed)
Subjective:    Patient ID: Joshua [name], male    DOB: Jun 27, 1945, 68 y.o.   MRN: [id number]  HPI Pt here for follow up and management of chronic medical problems. The patient is doing well with no particular complaints or problems. It is of note that his initial blood pressure today is somewhat elevated and this will be repeated. On health maintenance issues he is up-to-date. He is due to get a B12 injection and a pro time done today. We will also refill his verapamil. The patient does complain of some allergic rhinitis.       Patient Active Problem List   Diagnosis Date Noted  . BPH (benign prostatic hyperplasia) 01/29/2013  . History of hematuria 01/29/2013  . Family history of colon cancer 01/29/2013  . PAF (paroxysmal atrial fibrillation) 12/17/2012  . Aortic valve insufficiency 12/17/2012  . Mild mitral stenosis 12/17/2012  . Pulmonary infiltrates 07/17/2010  . COPD UNSPECIFIED 05/05/2010  . HYPERTROPHIC OBSTRUCTIVE CARDIOMYOPATHY 03/31/2010  . ATRIAL FLUTTER 03/31/2010  . HEMOCCULT POSITIVE STOOL 10/12/2008  . B12 DEFICIENCY 10/11/2008  . HYPERCHOLESTEROLEMIA 10/11/2008  . ANEMIA, IRON DEFICIENCY 10/11/2008  . CORONARY ARTERY DISEASE 10/11/2008  . FIBRILLATION, ATRIAL 10/11/2008  . GASTRITIS 10/11/2008  . HIATAL HERNIA WITH REFLUX 10/11/2008  . COLONIC POLYPS, ADENOMATOUS, HX OF 10/11/2008   Outpatient Encounter Prescriptions as of 05/21/2013  Medication Sig  . acetaminophen (TYLENOL) 325 MG tablet Take 650 mg by mouth as directed.    Marland Kitchen amoxicillin (AMOXIL) 500 MG capsule Take 1 capsule by mouth 4 (four) times daily.  Marland Kitchen aspirin 81 MG tablet Take 81 mg by mouth daily.    . Cholecalciferol (VITAMIN D3) 2000 UNITS TABS Take 1 tablet by mouth daily.   . CRESTOR 10 MG tablet TAKE ONE TABLET BY MOUTH ONCE DAILY  . disopyramide (NORPACE CR) 150 MG 12 hr capsule Take 2 capsules (300 mg total) by mouth 2 (two) times daily.  . hydrochlorothiazide (HYDRODIURIL) 25 MG tablet Take  1 tablet (25 mg total) by mouth daily.  Marland Kitchen omeprazole (PRILOSEC) 20 MG capsule Take 1 capsule (20 mg total) by mouth daily.  . verapamil (CALAN) 120 MG tablet Take 0.5 tablets by mouth daily. Takes 60 mg daily  . warfarin (COUMADIN) 5 MG tablet TAKE ONE & ONE-HALF TABLETS BY MOUTH EVERY DAY AS DIRECTED    Review of Systems  Constitutional: Negative.   HENT: Negative.   Eyes: Negative.   Respiratory: Negative.   Cardiovascular: Negative.        Elevated BP  Gastrointestinal: Negative.   Endocrine: Negative.   Genitourinary: Negative.   Musculoskeletal: Negative.   Skin: Negative.   Allergic/Immunologic: Negative.   Neurological: Negative.   Hematological: Negative.   Psychiatric/Behavioral: Negative.        Objective:   Physical Exam  Nursing note and vitals reviewed. Constitutional: He is oriented to person, place, and time. He appears well-developed and well-nourished. No distress.  Pleasant, somewhat overweight and alert  HENT:  Head: Normocephalic and atraumatic.  Right Ear: External ear normal.  Left Ear: External ear normal.  Mouth/Throat: Oropharynx is clear and moist. No oropharyngeal exudate.  Mild nasal congestion and erythema bilaterally  Eyes: Conjunctivae and EOM are normal. Pupils are equal, round, and reactive to light. Right eye exhibits no discharge. Left eye exhibits no discharge. No scleral icterus.  Neck: Normal range of motion. Neck supple. No thyromegaly present.  Cardiovascular: Normal rate, regular rhythm and intact distal pulses.  Exam reveals no gallop  and no friction rub.   Murmur heard. The rhythm is regular today at 60 per minute with a grade 1-8/5 systolic ejection murmur  Pulmonary/Chest: Effort normal. No respiratory distress. He has no wheezes. He has no rales. He exhibits no tenderness.  Bilateral basilar atelectasis posteriorly  Abdominal: Soft. Bowel sounds are normal. He exhibits no mass. There is no tenderness. There is no rebound and no  guarding.  Musculoskeletal: Normal range of motion. He exhibits no edema and no tenderness.  Lymphadenopathy:    He has no cervical adenopathy.  Neurological: He is alert and oriented to person, place, and time. He has normal reflexes. No cranial nerve deficit.  Skin: Skin is warm and dry. No rash noted. No erythema. No pallor.  Psychiatric: He has a normal mood and affect. His behavior is normal. Judgment and thought content normal.   BP 150/69  Pulse 58  Temp(Src) 97.5 F (36.4 C) (Oral)  Ht 5' 7.25" (1.708 m)  Wt 209 lb (94.802 kg)  BMI 32.50 kg/m2        Assessment & Plan:  1. ANEMIA, IRON DEFICIENCY - POCT CBC - cyanocobalamin ((VITAMIN B-12)) injection 1,000 mcg; Inject 1 mL (1,000 mcg total) into the muscle every 30 (thirty) days.  2. BPH (benign prostatic hyperplasia)  3. COPD UNSPECIFIED  4. FIBRILLATION, ATRIAL paroxysmal - BMP8+EGFR - Hepatic function panel  5. HYPERCHOLESTEROLEMIA - BMP8+EGFR - Hepatic function panel - NMR, lipoprofile  6. Vitamin D deficiency - Vit D  25 hydroxy (rtn osteoporosis monitoring)  7. High risk medication use  8. Allergic rhinitis  9. B12 deficiency anemia  10. Hypertrophic obstructive cardiomyopathy(425.11) Meds ordered this encounter  Medications  . amoxicillin (AMOXIL) 500 MG capsule    Sig: Take 1 capsule by mouth 4 (four) times daily.  . cyanocobalamin ((VITAMIN B-12)) injection 1,000 mcg    Sig:    Also verapamil will be refilled  Patient Instructions                       Medicare Annual Wellness Visit  [facility] and the medical providers at [facility] strive to bring you the best medical care.  In doing so we not only want to address your current medical conditions and concerns but also to detect new conditions early and prevent illness, disease and health-related problems.    Medicare offers a yearly Wellness Visit which allows our clinical staff to assess your need for  preventative services including immunizations, lifestyle education, counseling to decrease risk of preventable diseases and screening for fall risk and other medical concerns.    This visit is provided free of charge (no copay) for all Medicare recipients. The clinical pharmacists at [facility] have begun to conduct these Wellness Visits which will also include a thorough review of all your medications.    As you primary medical provider recommend that you make an appointment for your Annual Wellness Visit if you have not done so already this year.  You may set up this appointment before you leave today or you may call back ([phone]) and schedule an appointment.  Please make sure when you call that you mention that you are scheduling your Annual Wellness Visit with the clinical pharmacist so that the appointment may be made for the proper length of time.     Continue current medications. Continue good therapeutic lifestyle changes which include good diet and exercise. Fall precautions discussed with patient. If an FOBT  was given today- please return it to our front desk. If you are over 2 years old - you may need Prevnar 39 or the adult Pneumonia vaccine.  You can purchase debrox over-the-counter to help soften and will ask him that he should use 2-3 drops in each year wait one week and repeat this and then irrigate the ear and it will help remove the wax Monitor your blood pressures at home over the next couple weeks and bring these readings for review  he can purchase nasacoit or Flonase over-the-counter and use this as needed for allergic rhinitis Continue to use saline nose spray    Arrie Senate MD

## 2013-05-21 NOTE — Patient Instructions (Addendum)
Medicare Annual Wellness Visit  [facility] and the medical providers at [facility] strive to bring you the best medical care.  In doing so we not only want to address your current medical conditions and concerns but also to detect new conditions early and prevent illness, disease and health-related problems.    Medicare offers a yearly Wellness Visit which allows our clinical staff to assess your need for preventative services including immunizations, lifestyle education, counseling to decrease risk of preventable diseases and screening for fall risk and other medical concerns.    This visit is provided free of charge (no copay) for all Medicare recipients. The clinical pharmacists at [facility] have begun to conduct these Wellness Visits which will also include a thorough review of all your medications.    As you primary medical provider recommend that you make an appointment for your Annual Wellness Visit if you have not done so already this year.  You may set up this appointment before you leave today or you may call back ([phone]) and schedule an appointment.  Please make sure when you call that you mention that you are scheduling your Annual Wellness Visit with the clinical pharmacist so that the appointment may be made for the proper length of time.     Continue current medications. Continue good therapeutic lifestyle changes which include good diet and exercise. Fall precautions discussed with patient. If an FOBT was given today- please return it to our front desk. If you are over 37 years old - you may need Prevnar 30 or the adult Pneumonia vaccine.  You can purchase debrox over-the-counter to help soften and will ask him that he should use 2-3 drops in each year wait one week and repeat this and then irrigate the ear and it will help remove the wax Monitor your blood pressures at home over the next couple weeks and  bring these readings for review  he can purchase nasacoit or Flonase over-the-counter and use this as needed for allergic rhinitis Continue to use saline nose spray

## 2013-05-23 LAB — BMP8+EGFR
BUN/Creatinine Ratio: 17 (ref 10–22)
BUN: 19 mg/dL (ref 8–27)
CO2: 29 mmol/L (ref 18–29)
Calcium: 9 mg/dL (ref 8.6–10.2)
Chloride: 96 mmol/L — ABNORMAL LOW (ref 97–108)
Creatinine, Ser: 1.11 mg/dL (ref 0.76–1.27)
GFR calc Af Amer: 78 mL/min/{1.73_m2} (ref >59)
GFR calc non Af Amer: 68 mL/min/{1.73_m2} (ref >59)
Glucose: 92 mg/dL (ref 65–99)
Potassium: 4.1 mmol/L (ref 3.5–5.2)
Sodium: 139 mmol/L (ref 134–144)

## 2013-05-23 LAB — NMR, LIPOPROFILE
Cholesterol: 130 mg/dL (ref <200)
HDL Cholesterol by NMR: 46 mg/dL (ref >=40)
HDL Particle Number: 29.2 umol/L — ABNORMAL LOW (ref >=30.5)
LDL Particle Number: 734 nmol/L (ref <1000)
LDL Size: 20.6 nm (ref >20.5)
LDLC SERPL CALC-MCNC: 57 mg/dL (ref <100)
LP-IR Score: 45 (ref <=45)
Small LDL Particle Number: 371 nmol/L (ref <=527)
Triglycerides by NMR: 133 mg/dL (ref <150)

## 2013-05-23 LAB — HEPATIC FUNCTION PANEL
ALT: 23 IU/L (ref 0–44)
AST: 35 IU/L (ref 0–40)
Albumin: 4 g/dL (ref 3.6–4.8)
Alkaline Phosphatase: 59 IU/L (ref 39–117)
Bilirubin, Direct: 0.19 mg/dL (ref 0.00–0.40)
Total Bilirubin: 0.5 mg/dL (ref 0.0–1.2)
Total Protein: 6.9 g/dL (ref 6.0–8.5)

## 2013-05-23 LAB — VITAMIN D 25 HYDROXY (VIT D DEFICIENCY, FRACTURES): Vit D, 25-Hydroxy: 48.7 ng/mL (ref 30.0–100.0)

## 2013-06-11 ENCOUNTER — Other Ambulatory Visit: Payer: Self-pay | Admitting: Cardiovascular Disease

## 2013-06-11 NOTE — Telephone Encounter (Signed)
Rx was sent to pharmacy electronically. 

## 2013-06-15 ENCOUNTER — Ambulatory Visit (INDEPENDENT_AMBULATORY_CARE_PROVIDER_SITE_OTHER): Payer: Medicare Other | Admitting: Cardiovascular Disease

## 2013-06-15 ENCOUNTER — Encounter: Payer: Self-pay | Admitting: Cardiovascular Disease

## 2013-06-15 VITALS — BP 120/70 | HR 59 | Ht 68.0 in | Wt 212.3 lb

## 2013-06-15 DIAGNOSIS — I2510 Atherosclerotic heart disease of native coronary artery without angina pectoris: Secondary | ICD-10-CM

## 2013-06-15 DIAGNOSIS — E7800 Pure hypercholesterolemia, unspecified: Secondary | ICD-10-CM

## 2013-06-15 DIAGNOSIS — I4892 Unspecified atrial flutter: Secondary | ICD-10-CM

## 2013-06-15 DIAGNOSIS — I480 Paroxysmal atrial fibrillation: Secondary | ICD-10-CM

## 2013-06-15 DIAGNOSIS — I4891 Unspecified atrial fibrillation: Secondary | ICD-10-CM

## 2013-06-15 DIAGNOSIS — I359 Nonrheumatic aortic valve disorder, unspecified: Secondary | ICD-10-CM

## 2013-06-15 DIAGNOSIS — I050 Rheumatic mitral stenosis: Secondary | ICD-10-CM

## 2013-06-15 DIAGNOSIS — I351 Nonrheumatic aortic (valve) insufficiency: Secondary | ICD-10-CM

## 2013-06-15 DIAGNOSIS — I421 Obstructive hypertrophic cardiomyopathy: Secondary | ICD-10-CM

## 2013-06-15 NOTE — Patient Instructions (Signed)
Your physician recommends that you schedule a follow-up appointment in: 6 months. No changes were made today in your therapy. 

## 2013-06-16 ENCOUNTER — Encounter: Payer: Self-pay | Admitting: Cardiovascular Disease

## 2013-06-16 NOTE — Progress Notes (Signed)
Patient ID: Joshua [name], male   DOB: 01-Jan-1946, 68 y.o.   MRN: [id number]      HPI: Joshua [name] is a 68 y.o. male presents to the office for a 6 month cardiology evaluation.  [name] has a history of hypertrophic obstructive cardiomyopathy, coronary artery disease and is status post stenting of his proximal right coronary artery with distal PDA occlusion with left-to-right collaterals as well as stenting of his proximal LAD. His last cardiac catheterization was in 2007 at which time both stents were patent. He also has a history of paroxysmal atrial fibrillation for which she's been on chronic Coumadin therapy and did have an episode of atrial flutter but has been maintaining sinus rhythm on Norvasc therapy.  An echo Doppler study was in April 2013 which again confirmed severe concentric left ventricular hypertrophy. He had a resting left ventricle outflow track subvalvular gradient of 25 mm. Ejection fraction was hyperdynamic at 70%. He did have significant mitral annular calcification with mild mitral stenosis with mild MR as well as aortic valve sclerosis with mild to moderate aortic insufficiency. His last nuclear perfusion study in April 2012 showed normal perfusion.  His last NMR lipoprotein last July done by Dr. Laurance [name] which showed an LDL particle # 948 calculated LDL 66, HDL 41 total cholesterol 126 and triglycerides 94. Insulin resistance was slightly elevated at 53.  As I last saw [name], he underwent a followup echo Doppler study, which was done on 05/18/2013.  This essentially is unchanged from 2 years previously.  Ejection fraction is 60-65%.  His peak LVOT velocity was 2.6 m/s, there is severe asymmetric left hypertrophy with mild systolic anterior motion of the mitral valve and severe mitral annular calcification with mild MS. There is felt to be very mild aortic stenosis and aortic insufficiency. The left atrium was moderately dilated.  His PA pressure was 32 mm.  Joshua [name], he  denies recent chest pain.  He denies his previous peripheral edema or significant shortness of breath.  He denies bleeding on warfarin.  Past Medical History  Diagnosis Date  . Atrial fibrillation     persistent  . Coronary artery disease   . Hypercholesteremia   . Hypercholesteremia   . Iron deficiency anemia, unspecified   . Other B-complex deficiencies   . Hx of adenomatous colonic polyps 04-14-2003    Colonoscopy  . Gastritis 06-10-2006  . Hiatal hernia 06-10-2006    EGD  . Hard of hearing   . Rheumatic fever   . COPD (chronic obstructive pulmonary disease)     PFT 05-05-2010, FEV1 .9 (26%) ratio 60  . Hypertension   . Hypertrophic obstructive cardiomyopathy     mild subaortic stenosis  . LVH (left ventricular hypertrophy)     Past Surgical History  Procedure Laterality Date  . Coronary angioplasty      2002-2003  . Shoulder surgery    . Cardioversion  May 2012    [name]  . Cardiac catheterization  05/28/2005    Widely patent coronaries and normal LV function.  . Cardiac catheterization  08/11/2001    80% LAD stenosis successfully stented with a 3.5x49mm Cypher DES postdilated to 3.75mm resulting in reduction of 80% to 0%.  . Cardiac catheterization  01/15/2001    Focal 95% proximal RCA stenosis, stented with a 3x40mm Zeta multilink stent with postdilatation utilizing a 3.5x52mm Quantum balloon with stenosis being reduced to 0%.  . Cardiovascular stress test  05/29/2011    No scintigraphic evidence  of inducible myocardial ischemia. No ECG changes. EKG negative for ischemia.  . Transthoracic echocardiogram  05/29/2011    EF >70%, severe concentric LV hypertrophy, severe mitral annular calcification, mild-moderate aortic regurg,     Allergies  Allergen Reactions  . Tetracycline     Current Outpatient Prescriptions  Medication Sig Dispense Refill  . acetaminophen (TYLENOL) 325 MG tablet Take 650 mg by mouth as directed.        Marland Kitchen aspirin 81 MG tablet Take 81 mg by mouth  daily.        . Cholecalciferol (VITAMIN D3) 2000 UNITS TABS Take 1 tablet by mouth daily.       . CRESTOR 10 MG tablet TAKE ONE TABLET BY MOUTH ONCE DAILY  30 tablet  5  . disopyramide (NORPACE) 150 MG capsule TAKE TWO CAPSULES BY MOUTH TWICE DAILY  120 capsule  3  . hydrochlorothiazide (HYDRODIURIL) 25 MG tablet Take 1 tablet (25 mg total) by mouth daily.  90 tablet  3  . omeprazole (PRILOSEC) 20 MG capsule Take 1 capsule (20 mg total) by mouth daily.  30 capsule  11  . verapamil (CALAN) 120 MG tablet Take 0.5 tablets by mouth daily. Takes 60 mg daily      . warfarin (COUMADIN) 5 MG tablet TAKE ONE & ONE-HALF TABLETS BY MOUTH EVERY [name] AS DIRECTED  135 tablet  3   Current Facility-Administered Medications  Medication Dose Route Frequency Provider Last Rate Last Dose  . cyanocobalamin ((VITAMIN B-12)) injection 1,000 mcg  1,000 mcg Intramuscular Q30 days Chipper Herb, MD   1,000 mcg at 05/21/13 0820    History   Social History  . Marital Status: Widowed    Spouse Name: N/A    Number of Children: 8  . Years of Education: N/A   Occupational History  . retired Event organiser    Social History Main Topics  . Smoking status: Former Smoker -- 1.00 packs/[name] for 10 years    Types: Cigarettes    Quit date: 02/26/1985  . Smokeless tobacco: Never Used  . Alcohol Use: No  . Drug Use: No  . Sexual Activity: Not on file   Other Topics Concern  . Not on file   Social History Narrative   Lives in [facility]   Married with two sons and three daughters    Family History  Problem Relation Age of Onset  . Colon cancer Paternal Grandfather   . Heart attack Brother 70    Ventircular rupture  . Emphysema Brother   . Lung cancer Brother   . Heart disease Mother     Enlarged heart   Socially he tries to remain active. He does kayak. He also does work in his yard. He is widowed. He has 4 children one deceased. One grandchild. Is no tobacco or alcohol use.  ROS is negative for  fevers, chills or night sweats.  Has no significant weight change  He denies visual symptoms.  He denies change in hearing.  There is only mild shortness of breath with activity. He denies chest pressure or angina. He denies presyncope unless he stands up very abruptly. There is no frank syncope. He denies cough or sputum production. He denies change in bowel or bladder habits. Does admit to difficulty in losing his abdominal girth. He does have brawny stasis changes to his lower extremities which are chronic. He denies paresthesias. There is no known diabetes.  He denies cold or heat intolerance.  He denies issues  with sleep.  Other comprehensive 14 point system review is negative.  PE BP 120/70  Pulse 59  Ht 5\' 8"  (1.727 m)  Wt 212 lb 4.8 oz (96.299 kg)  BMI 32.29 kg/m2  General: Alert, oriented, no distress.  Skin: normal turgor, no rashes HEENT: Normocephalic, atraumatic. Pupils round and reactive; sclera anicteric;no lid lag.  Nose without nasal septal hypertrophy Mouth/Parynx benign; Mallinpatti scale 3 Neck: No JVD, transmitted murmur to carotids Lungs: clear to ausculatation and percussion; no wheezing or rales Heart: RRR, s1 s2 normal 2/6 systolic murmur in the aortic region and a 1/6 diastolic murmur compatible with his aortic insufficiency. He does have mild bisferens pulse with murmurs transmitted to his carotids. Murmur increase with the squatting position. Abdomen: Moderate central adiposity; soft, nontender; no hepatosplenomehaly, BS+; abdominal aorta nontender and not dilated by palpation. Pulses 2+ Extremities: Brawny stasis changes which are old without frank edema presently; no clubbing cyanosis, Homan's sign negative  Neurologic: grossly nonfocal Psychologic: normal affect and mood.  ECG (independently read by me and (sinus rhythm at 59 beats per minute.  First degree AV block with PR interval 220 ms.  Increased QTC of 500 ms.  Interventricular conduction delay.  Prior 10/  22/2014 ECG: Sinus rhythm with first-degree AV block with PR interval 232 ms. QT interval 495 ms.  LABS:  BMET    Component Value Date/Time   NA 139 05/21/2013 0856   NA 139 08/20/2012 0930   K 4.1 05/21/2013 0856   CL 96* 05/21/2013 0856   CO2 29 05/21/2013 0856   GLUCOSE 92 05/21/2013 0856   GLUCOSE 77 08/20/2012 0930   BUN 19 05/21/2013 0856   BUN 25* 08/20/2012 0930   CREATININE 1.11 05/21/2013 0856   CREATININE 1.10 08/20/2012 0930   CALCIUM 9.0 05/21/2013 0856   GFRNONAA 68 05/21/2013 0856   GFRNONAA 69 08/20/2012 0930   GFRAA 78 05/21/2013 0856   GFRAA 80 08/20/2012 0930     Hepatic Function Panel     Component Value Date/Time   PROT 6.9 05/21/2013 0856   PROT 7.3 08/20/2012 0930   ALBUMIN 3.6 08/20/2012 0930   AST 35 05/21/2013 0856   ALT 23 05/21/2013 0856   ALKPHOS 59 05/21/2013 0856   BILITOT 0.5 05/21/2013 0856   BILIDIR 0.19 05/21/2013 0856   IBILI 0.7 08/20/2012 0930     CBC    Component Value Date/Time   WBC 6.1 05/21/2013 0921   WBC 5.4 07/03/2010 1528   RBC 5.1 05/21/2013 0921   RBC 4.43 07/03/2010 1528   HGB 15.3 05/21/2013 0921   HGB 12.2* 07/03/2010 1528   HCT 47.4 05/21/2013 0921   HCT 39.2 07/03/2010 1528   PLT 148* 07/03/2010 1528   MCV 93.4 05/21/2013 0921   MCV 88.5 07/03/2010 1528   MCH 30.1 05/21/2013 0921   MCH 27.5 07/03/2010 1528   MCHC 32.3 05/21/2013 0921   MCHC 31.1 07/03/2010 1528   RDW 14.3 07/03/2010 1528   LYMPHSABS 1.2 03/31/2010 1739   MONOABS 0.1 03/31/2010 1739   EOSABS 0.2 03/31/2010 1739   BASOSABS 0.0 03/31/2010 1739     BNP    Component Value Date/Time   PROBNP 541.0* 05/05/2010 1158    Lipid Panel     Component Value Date/Time   CHOL 130 05/21/2013 0856   TRIG 94 08/20/2012 0930   LDLCALC 66 08/20/2012 0930     RADIOLOGY: No results found.    ASSESSMENT AND PLAN: I have been following Mr. Airam Raifsnider  for over 20 years. He has a history of hypertrophic obstructive cardiomyopathy. He also is status post two-vessel coronary intervention and at last  catheterization a patent stents in his LAD and proximal right coronary artery. He is maintaining sinus rhythm on his current dose of Norpace. He also is on very low-dose verapamil. His blood pressure today is controlled when taken by me was 122/70. His last nuclear perfusion study was in April 2013 which continued to show fairly normal perfusion.  I reviewed his most recent echo Doppler study and compared this to his  Study of 2 years previously.  It does not appear that his LVOT  gradient has significantly increased.  He is fairly well compensated on his current medical regimen.  He's not having any breakthrough to fibrillation.  His blood pressure is controlled.  He's not having any significant symptoms of previous shortness of breath.  Presently, there is no leg edema.  I recommended he continue the her medical regimen as prescribed.  If laboratory is done by Dr. Laurance [name].  I will obtain these for my review.  I also had a discussion with him concerning warfarin versus newer novel anticoagulation therapy.  I'll see him in 6 months for follow-up cardiology evaluation.  Troy Sine, MD, [facility]  06/16/2013 5:42 PM

## 2013-06-25 ENCOUNTER — Ambulatory Visit (INDEPENDENT_AMBULATORY_CARE_PROVIDER_SITE_OTHER): Payer: Medicare Other | Admitting: Pharmacist

## 2013-06-25 DIAGNOSIS — I480 Paroxysmal atrial fibrillation: Secondary | ICD-10-CM

## 2013-06-25 DIAGNOSIS — I359 Nonrheumatic aortic valve disorder, unspecified: Secondary | ICD-10-CM

## 2013-06-25 DIAGNOSIS — D509 Iron deficiency anemia, unspecified: Secondary | ICD-10-CM

## 2013-06-25 DIAGNOSIS — I4891 Unspecified atrial fibrillation: Secondary | ICD-10-CM

## 2013-06-25 DIAGNOSIS — I351 Nonrheumatic aortic (valve) insufficiency: Secondary | ICD-10-CM

## 2013-06-25 LAB — POCT INR: INR: 3

## 2013-06-25 NOTE — Patient Instructions (Signed)
Anticoagulation Dose Instructions as of 06/25/2013     Joshua [name] Tue Wed Thu Fri Sat   New Dose 7.5 mg 5 mg 5 mg 5 mg 5 mg 5 mg 5 mg    Description       Continue warfarin 1 and 1/2 tablets on Sundays and 1 tablet all other days.      INR was 3.0 today

## 2013-07-01 ENCOUNTER — Ambulatory Visit (INDEPENDENT_AMBULATORY_CARE_PROVIDER_SITE_OTHER): Payer: Medicare Other

## 2013-07-01 ENCOUNTER — Ambulatory Visit (INDEPENDENT_AMBULATORY_CARE_PROVIDER_SITE_OTHER): Payer: Medicare Other | Admitting: General Practice

## 2013-07-01 ENCOUNTER — Encounter: Payer: Self-pay | Admitting: General Practice

## 2013-07-01 VITALS — BP 155/68 | HR 67 | Temp 97.5°F | Ht 68.0 in | Wt 212.8 lb

## 2013-07-01 DIAGNOSIS — R0781 Pleurodynia: Secondary | ICD-10-CM

## 2013-07-01 DIAGNOSIS — W19XXXA Unspecified fall, initial encounter: Secondary | ICD-10-CM

## 2013-07-01 DIAGNOSIS — T148XXA Other injury of unspecified body region, initial encounter: Secondary | ICD-10-CM

## 2013-07-01 DIAGNOSIS — R079 Chest pain, unspecified: Secondary | ICD-10-CM

## 2013-07-01 MED ORDER — CYCLOBENZAPRINE HCL 5 MG PO TABS: ORAL_TABLET | ORAL | Status: DC

## 2013-07-01 NOTE — Patient Instructions (Addendum)

## 2013-07-01 NOTE — Progress Notes (Signed)
   Subjective:    Patient ID: Joshua [name], male    DOB: 02-11-1946, 68 y.o.   MRN: [id number]  Fall The accident occurred 12 to 24 hours ago. The fall occurred while walking. He fell from a height of 3 to 5 ft. Impact surface: landed on gravel. The point of impact was the left elbow. The pain is present in the left elbow (left rib area pain). The pain is at a severity of 3/10. Pertinent negatives include no abdominal pain, fever, headaches, hematuria, loss of consciousness, numbness, tingling or vomiting. He has tried nothing for the symptoms.      Review of Systems  Constitutional: Negative for fever and chills.  Respiratory: Negative for shortness of breath.   Cardiovascular: Negative for chest pain and palpitations.  Gastrointestinal: Negative for vomiting and abdominal pain.  Genitourinary: Negative for hematuria.  Musculoskeletal:       Left elbow pain and left rib area pain  Skin:       Left elbow bandage, clean, dry,and intact  Neurological: Negative for dizziness, tingling, loss of consciousness, numbness and headaches.       Objective:   Physical Exam  Constitutional: He is oriented to person, place, and time. He appears well-developed and well-nourished.  Cardiovascular: Normal rate, regular rhythm and normal heart sounds.   Pulmonary/Chest: Effort normal and breath sounds normal. No respiratory distress. He exhibits no tenderness.  Musculoskeletal: He exhibits tenderness.  Left elbow and rib area tenderness  Neurological: He is oriented to person, place, and time.  Skin: Skin is warm and dry.  Psychiatric: He has a normal mood and affect.     [facility] reading (PRIMARY) by Erby Pian, FNP-C, no fracture or dislocation noted.      Assessment & Plan:  1. Fall, 2. Rib pain on left side  - DG Ribs Unilateral W/Chest Left; Future  3. Muscle strain  - cyclobenzaprine (FLEXERIL) 5 MG tablet; May take 1-2 tablets at bedtime prn for muscle spasms  Dispense: 20  tablet; Refill: 0 -patient education provided and discussed -discussed signs and symptoms of infections  -RTO prn -Patient verbalized understanding Erby Pian, FNP-C

## 2013-07-11 ENCOUNTER — Other Ambulatory Visit: Payer: Self-pay | Admitting: Family Medicine

## 2013-07-28 ENCOUNTER — Ambulatory Visit (INDEPENDENT_AMBULATORY_CARE_PROVIDER_SITE_OTHER): Payer: Medicare Other | Admitting: Pharmacist Clinician (PhC)/ Clinical Pharmacy Specialist

## 2013-07-28 DIAGNOSIS — D509 Iron deficiency anemia, unspecified: Secondary | ICD-10-CM

## 2013-07-28 DIAGNOSIS — I480 Paroxysmal atrial fibrillation: Secondary | ICD-10-CM

## 2013-07-28 DIAGNOSIS — I359 Nonrheumatic aortic valve disorder, unspecified: Secondary | ICD-10-CM

## 2013-07-28 DIAGNOSIS — I4891 Unspecified atrial fibrillation: Secondary | ICD-10-CM

## 2013-07-28 DIAGNOSIS — I351 Nonrheumatic aortic (valve) insufficiency: Secondary | ICD-10-CM

## 2013-07-28 LAB — POCT INR: INR: 2.3

## 2013-08-03 ENCOUNTER — Encounter: Payer: Self-pay | Admitting: Cardiovascular Disease

## 2013-08-06 ENCOUNTER — Other Ambulatory Visit: Payer: Self-pay | Admitting: Family Medicine

## 2013-09-01 ENCOUNTER — Ambulatory Visit (INDEPENDENT_AMBULATORY_CARE_PROVIDER_SITE_OTHER): Payer: Medicare Other | Admitting: Pharmacist Clinician (PhC)/ Clinical Pharmacy Specialist

## 2013-09-01 ENCOUNTER — Ambulatory Visit (INDEPENDENT_AMBULATORY_CARE_PROVIDER_SITE_OTHER): Payer: Medicare Other

## 2013-09-01 ENCOUNTER — Encounter: Payer: Self-pay | Admitting: Pharmacist

## 2013-09-01 DIAGNOSIS — I351 Nonrheumatic aortic (valve) insufficiency: Secondary | ICD-10-CM

## 2013-09-01 DIAGNOSIS — I482 Chronic atrial fibrillation, unspecified: Secondary | ICD-10-CM

## 2013-09-01 DIAGNOSIS — I480 Paroxysmal atrial fibrillation: Secondary | ICD-10-CM

## 2013-09-01 DIAGNOSIS — D509 Iron deficiency anemia, unspecified: Secondary | ICD-10-CM

## 2013-09-01 DIAGNOSIS — I4891 Unspecified atrial fibrillation: Secondary | ICD-10-CM

## 2013-09-01 DIAGNOSIS — I359 Nonrheumatic aortic valve disorder, unspecified: Secondary | ICD-10-CM

## 2013-09-01 LAB — POCT INR: INR: 2.4

## 2013-09-21 ENCOUNTER — Telehealth: Payer: Self-pay | Admitting: Cardiovascular Disease

## 2013-09-21 NOTE — Telephone Encounter (Signed)
Closed encounter °

## 2013-09-29 ENCOUNTER — Encounter: Payer: Self-pay | Admitting: Family Medicine

## 2013-09-29 ENCOUNTER — Ambulatory Visit (INDEPENDENT_AMBULATORY_CARE_PROVIDER_SITE_OTHER): Payer: Medicare Other | Admitting: Family Medicine

## 2013-09-29 VITALS — BP 140/63 | HR 57 | Temp 97.0°F | Ht 68.0 in | Wt 204.0 lb

## 2013-09-29 DIAGNOSIS — K5900 Constipation, unspecified: Secondary | ICD-10-CM

## 2013-09-29 DIAGNOSIS — D509 Iron deficiency anemia, unspecified: Secondary | ICD-10-CM

## 2013-09-29 DIAGNOSIS — E538 Deficiency of other specified B group vitamins: Secondary | ICD-10-CM

## 2013-09-29 DIAGNOSIS — E559 Vitamin D deficiency, unspecified: Secondary | ICD-10-CM

## 2013-09-29 DIAGNOSIS — J449 Chronic obstructive pulmonary disease, unspecified: Secondary | ICD-10-CM

## 2013-09-29 DIAGNOSIS — E7800 Pure hypercholesterolemia, unspecified: Secondary | ICD-10-CM

## 2013-09-29 DIAGNOSIS — I2510 Atherosclerotic heart disease of native coronary artery without angina pectoris: Secondary | ICD-10-CM

## 2013-09-29 DIAGNOSIS — J301 Allergic rhinitis due to pollen: Secondary | ICD-10-CM

## 2013-09-29 DIAGNOSIS — Z79899 Other long term (current) drug therapy: Secondary | ICD-10-CM

## 2013-09-29 DIAGNOSIS — N400 Enlarged prostate without lower urinary tract symptoms: Secondary | ICD-10-CM

## 2013-09-29 NOTE — Addendum Note (Signed)
Addended by: Pollyann Kennedy F on: 09/29/2013 10:05 AM   Modules accepted: Orders

## 2013-09-29 NOTE — Progress Notes (Signed)
Subjective:    Patient ID: Joshua [name], male    DOB: 11-01-1945, 68 y.o.   MRN: [id number]  HPI Pt here for follow up and management of chronic medical problems. The patient is doing well overall. He is here today for lab work. He does complain of some conservation and some sinus drainage and congestion and hoarseness. He indicates this has been going on for several months. The patient sees a cardiologist every 6 months on a regular basis.      Patient Active Problem List   Diagnosis Date Noted  . High risk medication use 05/21/2013  . BPH (benign prostatic hyperplasia) 01/29/2013  . History of hematuria 01/29/2013  . Family history of colon cancer 01/29/2013  . PAF (paroxysmal atrial fibrillation) 12/17/2012  . Aortic valve insufficiency 12/17/2012  . Mild mitral stenosis 12/17/2012  . Pulmonary infiltrates 07/17/2010  . COPD UNSPECIFIED 05/05/2010  . HYPERTROPHIC OBSTRUCTIVE CARDIOMYOPATHY 03/31/2010  . ATRIAL FLUTTER 03/31/2010  . HEMOCCULT POSITIVE STOOL 10/12/2008  . B12 DEFICIENCY 10/11/2008  . HYPERCHOLESTEROLEMIA 10/11/2008  . ANEMIA, IRON DEFICIENCY 10/11/2008  . CORONARY ARTERY DISEASE 10/11/2008  . FIBRILLATION, ATRIAL 10/11/2008  . GASTRITIS 10/11/2008  . HIATAL HERNIA WITH REFLUX 10/11/2008  . COLONIC POLYPS, ADENOMATOUS, HX OF 10/11/2008   Outpatient Encounter Prescriptions as of 09/29/2013  Medication Sig  . acetaminophen (TYLENOL) 325 MG tablet Take 650 mg by mouth as directed.    Marland Kitchen aspirin 81 MG tablet Take 81 mg by mouth daily.    . Cholecalciferol (VITAMIN D3) 2000 UNITS TABS Take 1 tablet by mouth daily.   . CRESTOR 10 MG tablet TAKE ONE TABLET BY MOUTH ONCE DAILY  . disopyramide (NORPACE) 150 MG capsule TAKE TWO CAPSULES BY MOUTH TWICE DAILY  . hydrochlorothiazide (HYDRODIURIL) 25 MG tablet Take 1 tablet (25 mg total) by mouth daily.  Marland Kitchen omeprazole (PRILOSEC) 20 MG capsule Take 1 capsule (20 mg total) by mouth daily.  . verapamil (CALAN) 120 MG tablet  TAKE ONE TABLET BY MOUTH ONCE DAILY  . warfarin (COUMADIN) 5 MG tablet TAKE ONE & ONE-HALF TABLETS BY MOUTH EVERY DAY AS DIRECTED  . [DISCONTINUED] verapamil (CALAN) 120 MG tablet Take 0.5 tablets by mouth daily. Takes 60 mg daily  . [DISCONTINUED] cyclobenzaprine (FLEXERIL) 5 MG tablet May take 1-2 tablets at bedtime prn for muscle spasms    Review of Systems  Constitutional: Negative.   HENT: Positive for postnasal drip, sinus pressure and voice change.   Eyes: Negative.   Respiratory: Negative.   Cardiovascular: Negative.   Gastrointestinal: Positive for constipation.  Endocrine: Negative.   Genitourinary: Negative.   Musculoskeletal: Negative.   Skin: Negative.   Allergic/Immunologic: Negative.   Neurological: Negative.   Hematological: Negative.   Psychiatric/Behavioral: Negative.        Objective:   Physical Exam  Nursing note and vitals reviewed. Constitutional: He is oriented to person, place, and time. He appears well-developed and well-nourished. No distress.  HENT:  Head: Normocephalic and atraumatic.  Right Ear: External ear normal.  Left Ear: External ear normal.  Mouth/Throat: Oropharynx is clear and moist. No oropharyngeal exudate.  Nasal congestion and turbinate swelling bilateral  Eyes: Conjunctivae and EOM are normal. Pupils are equal, round, and reactive to light. Right eye exhibits no discharge. Left eye exhibits no discharge. No scleral icterus.  Neck: Normal range of motion. Neck supple. No thyromegaly present.  No carotid bruits  Cardiovascular: Normal rate, regular rhythm and intact distal pulses.  Exam reveals no gallop and  no friction rub.   Murmur heard. There is no atrial fibrillation today. There is a grade 2/6 systolic ejection murmur  Pulmonary/Chest: Effort normal and breath sounds normal. No respiratory distress. He has no wheezes. He has no rales. He exhibits no tenderness.  Basilar atelectasis bilaterally  Abdominal: Soft. Bowel sounds are  normal. He exhibits no mass. There is no tenderness. There is no rebound and no guarding.  Obesity without masses or tenderness  Musculoskeletal: Normal range of motion. He exhibits no edema and no tenderness.  Lymphadenopathy:    He has no cervical adenopathy.  Neurological: He is alert and oriented to person, place, and time. He has normal reflexes. No cranial nerve deficit.  Sensation intact the is intact bilaterally with good pulses bilaterally  Skin: Skin is warm and dry. No rash noted. No erythema. No pallor.  Psychiatric: He has a normal mood and affect. His behavior is normal. Judgment and thought content normal.   BP 140/63  Pulse 57  Temp(Src) 97 F (36.1 C) (Oral)  Ht 5' 8"  (1.727 m)  Wt 204 lb (92.534 kg)  BMI 31.03 kg/m2        Assessment & Plan:  1. ANEMIA, IRON DEFICIENCY - POCT CBC  2. B12 DEFICIENCY - POCT CBC - Vitamin B12  3. BPH (benign prostatic hyperplasia) - POCT CBC  4. COPD UNSPECIFIED - POCT CBC  5. CORONARY ARTERY DISEASE - POCT CBC - BMP8+EGFR - Hepatic function panel  6. HYPERCHOLESTEROLEMIA - POCT CBC - NMR, lipoprofile  7. High risk medication use - POCT CBC  8. Vitamin D deficiency - Vit D  25 hydroxy (rtn osteoporosis monitoring)  9. Constipation, unspecified constipation type -Continue to drink plenty of fluids and try the probiotic as direct  10. Allergic rhinitis due to pollen -Flonase over-the-counter  No orders of the defined types were placed in this encounter.   Patient Instructions                       Medicare Annual Wellness Visit  [facility] and the medical providers at [facility] strive to bring you the best medical care.  In doing so we not only want to address your current medical conditions and concerns but also to detect new conditions early and prevent illness, disease and health-related problems.    Medicare offers a yearly Wellness Visit which allows our clinical staff to  assess your need for preventative services including immunizations, lifestyle education, counseling to decrease risk of preventable diseases and screening for fall risk and other medical concerns.    This visit is provided free of charge (no copay) for all Medicare recipients. The clinical pharmacists at [facility] have begun to conduct these Wellness Visits which will also include a thorough review of all your medications.    As you primary medical provider recommend that you make an appointment for your Annual Wellness Visit if you have not done so already this year.  You may set up this appointment before you leave today or you may call back [phone]) and schedule an appointment.  Please make sure when you call that you mention that you are scheduling your Annual Wellness Visit with the clinical pharmacist so that the appointment may be made for the proper length of time.     Continue current medications. Continue good therapeutic lifestyle changes which include good diet and exercise. Fall precautions discussed with patient. If an FOBT was given today- please  return it to our front desk. If you are over 60 years old - you may need Prevnar 30 or the adult Pneumonia vaccine.  Purchase Flonase over-the-counter and do 1-2 sprays each nostril at bedtime, for allergic rhinitis Align is a probiotic and taking one of these daily may help your digestive system and your constipation Continue to drink plenty of fluids Stay as active as possible Continue followup with cardiologist   Arrie Senate MD

## 2013-09-29 NOTE — Patient Instructions (Addendum)
Medicare Annual Wellness Visit  [facility] and the medical providers at [facility] strive to bring you the best medical care.  In doing so we not only want to address your current medical conditions and concerns but also to detect new conditions early and prevent illness, disease and health-related problems.    Medicare offers a yearly Wellness Visit which allows our clinical staff to assess your need for preventative services including immunizations, lifestyle education, counseling to decrease risk of preventable diseases and screening for fall risk and other medical concerns.    This visit is provided free of charge (no copay) for all Medicare recipients. The clinical pharmacists at [facility] have begun to conduct these Wellness Visits which will also include a thorough review of all your medications.    As you primary medical provider recommend that you make an appointment for your Annual Wellness Visit if you have not done so already this year.  You may set up this appointment before you leave today or you may call back [phone]) and schedule an appointment.  Please make sure when you call that you mention that you are scheduling your Annual Wellness Visit with the clinical pharmacist so that the appointment may be made for the proper length of time.     Continue current medications. Continue good therapeutic lifestyle changes which include good diet and exercise. Fall precautions discussed with patient. If an FOBT was given today- please return it to our front desk. If you are over 9 years old - you may need Prevnar 13 or the adult Pneumonia vaccine.  Purchase Flonase over-the-counter and do 1-2 sprays each nostril at bedtime, for allergic rhinitis Align is a probiotic and taking one of these daily may help your digestive system and your constipation Continue to drink plenty of fluids Stay as active as  possible Continue followup with cardiologist

## 2013-09-29 NOTE — Addendum Note (Signed)
Addended by: Selmer Dominion on: 09/29/2013 09:44 AM   Modules accepted: Orders

## 2013-09-30 LAB — BMP8+EGFR
BUN/Creatinine Ratio: 17 (ref 10–22)
BUN: 21 mg/dL (ref 8–27)
CO2: 30 mmol/L — ABNORMAL HIGH (ref 18–29)
Calcium: 9.1 mg/dL (ref 8.6–10.2)
Chloride: 97 mmol/L (ref 97–108)
Creatinine, Ser: 1.23 mg/dL (ref 0.76–1.27)
GFR calc Af Amer: 69 mL/min/{1.73_m2} (ref >59)
GFR calc non Af Amer: 60 mL/min/{1.73_m2} (ref >59)
Glucose: 79 mg/dL (ref 65–99)
Potassium: 4.2 mmol/L (ref 3.5–5.2)
Sodium: 140 mmol/L (ref 134–144)

## 2013-09-30 LAB — HEPATIC FUNCTION PANEL
ALT: 21 IU/L (ref 0–44)
AST: 25 IU/L (ref 0–40)
Albumin: 3.9 g/dL (ref 3.6–4.8)
Alkaline Phosphatase: 61 IU/L (ref 39–117)
Bilirubin, Direct: 0.15 mg/dL (ref 0.00–0.40)
Total Bilirubin: 0.5 mg/dL (ref 0.0–1.2)
Total Protein: 7 g/dL (ref 6.0–8.5)

## 2013-09-30 LAB — CBC WITH DIFFERENTIAL
Basophils Absolute: 0 10*3/uL (ref 0.0–0.2)
Basos: 1 %
Eos: 2 %
Eosinophils Absolute: 0.1 10*3/uL (ref 0.0–0.4)
HCT: 46.8 % (ref 37.5–51.0)
Hemoglobin: 15.8 g/dL (ref 12.6–17.7)
Immature Grans (Abs): 0 10*3/uL (ref 0.0–0.1)
Immature Granulocytes: 0 %
Lymphocytes Absolute: 1.4 10*3/uL (ref 0.7–3.1)
Lymphs: 23 %
MCH: 31 pg (ref 26.6–33.0)
MCHC: 33.8 g/dL (ref 31.5–35.7)
MCV: 92 fL (ref 79–97)
Monocytes Absolute: 0.4 10*3/uL (ref 0.1–0.9)
Monocytes: 7 %
Neutrophils Absolute: 3.9 10*3/uL (ref 1.4–7.0)
Neutrophils Relative %: 67 %
Platelets: 137 10*3/uL — ABNORMAL LOW (ref 150–379)
RBC: 5.1 x10E6/uL (ref 4.14–5.80)
RDW: 14.1 % (ref 12.3–15.4)
WBC: 5.8 10*3/uL (ref 3.4–10.8)

## 2013-09-30 LAB — NMR, LIPOPROFILE
Cholesterol: 130 mg/dL (ref 100–199)
HDL Cholesterol by NMR: 44 mg/dL (ref >39)
HDL Particle Number: 30.7 umol/L (ref >=30.5)
LDL Particle Number: 763 nmol/L (ref <1000)
LDL Size: 20.6 nm (ref >20.5)
LDLC SERPL CALC-MCNC: 63 mg/dL (ref 0–99)
LP-IR Score: 46 — ABNORMAL HIGH (ref <=45)
Small LDL Particle Number: 232 nmol/L (ref <=527)
Triglycerides by NMR: 113 mg/dL (ref 0–149)

## 2013-09-30 LAB — VITAMIN D 25 HYDROXY (VIT D DEFICIENCY, FRACTURES): Vit D, 25-Hydroxy: 51.1 ng/mL (ref 30.0–100.0)

## 2013-09-30 LAB — VITAMIN B12: Vitamin B-12: 502 pg/mL (ref 211–946)

## 2013-10-05 ENCOUNTER — Other Ambulatory Visit: Payer: Self-pay | Admitting: Family Medicine

## 2013-10-13 ENCOUNTER — Ambulatory Visit (INDEPENDENT_AMBULATORY_CARE_PROVIDER_SITE_OTHER): Payer: Medicare Other | Admitting: Pharmacist Clinician (PhC)/ Clinical Pharmacy Specialist

## 2013-10-13 DIAGNOSIS — D509 Iron deficiency anemia, unspecified: Secondary | ICD-10-CM

## 2013-10-13 DIAGNOSIS — I4891 Unspecified atrial fibrillation: Secondary | ICD-10-CM

## 2013-10-13 DIAGNOSIS — I359 Nonrheumatic aortic valve disorder, unspecified: Secondary | ICD-10-CM

## 2013-10-13 DIAGNOSIS — I480 Paroxysmal atrial fibrillation: Secondary | ICD-10-CM

## 2013-10-13 DIAGNOSIS — I351 Nonrheumatic aortic (valve) insufficiency: Secondary | ICD-10-CM

## 2013-10-13 DIAGNOSIS — I482 Chronic atrial fibrillation, unspecified: Secondary | ICD-10-CM

## 2013-10-13 LAB — POCT INR: INR: 2.7

## 2013-10-19 ENCOUNTER — Other Ambulatory Visit: Payer: Self-pay | Admitting: Cardiovascular Disease

## 2013-10-19 NOTE — Telephone Encounter (Signed)
Rx refill sent to patient pharmacy   

## 2013-10-26 ENCOUNTER — Ambulatory Visit (INDEPENDENT_AMBULATORY_CARE_PROVIDER_SITE_OTHER): Payer: Medicare Other | Admitting: Family Medicine

## 2013-10-26 VITALS — BP 150/63 | HR 60 | Temp 97.1°F | Ht 68.0 in | Wt 204.0 lb

## 2013-10-26 DIAGNOSIS — N390 Urinary tract infection, site not specified: Secondary | ICD-10-CM

## 2013-10-26 DIAGNOSIS — R319 Hematuria, unspecified: Secondary | ICD-10-CM

## 2013-10-26 DIAGNOSIS — R350 Frequency of micturition: Secondary | ICD-10-CM

## 2013-10-26 LAB — POCT URINALYSIS DIPSTICK
Bilirubin, UA: NEGATIVE
Glucose, UA: NEGATIVE
Ketones, UA: NEGATIVE
Nitrite, UA: NEGATIVE
Protein, UA: NEGATIVE
Spec Grav, UA: 1.02
Urobilinogen, UA: NEGATIVE
pH, UA: 6

## 2013-10-26 LAB — POCT UA - MICROSCOPIC ONLY
Bacteria, U Microscopic: NEGATIVE
Casts, Ur, LPF, POC: NEGATIVE
Crystals, Ur, HPF, POC: NEGATIVE
Mucus, UA: NEGATIVE
Yeast, UA: NEGATIVE

## 2013-10-26 MED ORDER — CIPROFLOXACIN HCL 500 MG PO TABS: 500.0000 mg | ORAL_TABLET | Freq: Two times a day (BID) | ORAL | Status: DC

## 2013-10-26 NOTE — Progress Notes (Signed)
   Subjective:    Patient ID: Joshua [name], male    DOB: Sep 27, 1945, 68 y.o.   MRN: [id number]  HPI This 68 y.o. male presents for evaluation of dysuria and urinary frequency.   Review of Systems C/o dysuria No chest pain, SOB, HA, dizziness, vision change, N/V, diarrhea, constipation, myalgias, arthralgias or rash.     Objective:   Physical Exam  Vital signs noted  Well developed well nourished male.  HEENT - Head atraumatic Normocephalic                Eyes - PERRLA, Conjuctiva - clear Sclera- Clear EOMI                Ears - EAC's Wnl TM's Wnl Gross Hearing WNL                Throat - oropharanx wnl Respiratory - Lungs CTA bilateral Cardiac - RRR S1 and S2 without murmur GI - Abdomen soft Nontender and bowel sounds active x 4 Extremities - No edema. Neuro - Grossly intact.  Results for orders placed in visit on 10/26/13  POCT URINALYSIS DIPSTICK      Result Value Ref Range   Color, UA straw     Clarity, UA cloudy     Glucose, UA neg     Bilirubin, UA neg     Ketones, UA neg     Spec Grav, UA 1.020     Blood, UA large     pH, UA 6.0     Protein, UA neg     Urobilinogen, UA negative     Nitrite, UA neg     Leukocytes, UA large (3+)    POCT UA - MICROSCOPIC ONLY      Result Value Ref Range   WBC, Ur, HPF, POC 5-15     RBC, urine, microscopic 30-40     Bacteria, U Microscopic neg     Mucus, UA neg     Epithelial cells, urine per micros occ     Crystals, Ur, HPF, POC neg     Casts, Ur, LPF, POC neg     Yeast, UA neg        Assessment & Plan:  Frequent urination - Plan: POCT urinalysis dipstick, POCT UA - Microscopic Only, ciprofloxacin (CIPRO) 500 MG tablet, Urine culture  Urinary tract infection with hematuria, site unspecified - Plan: ciprofloxacin (CIPRO) 500 MG tablet, Urine culture  Lysbeth Penner FNP

## 2013-10-28 LAB — URINE CULTURE

## 2013-11-03 ENCOUNTER — Ambulatory Visit (INDEPENDENT_AMBULATORY_CARE_PROVIDER_SITE_OTHER): Payer: Medicare Other | Admitting: Pharmacist Clinician (PhC)/ Clinical Pharmacy Specialist

## 2013-11-03 DIAGNOSIS — I4891 Unspecified atrial fibrillation: Secondary | ICD-10-CM

## 2013-11-03 DIAGNOSIS — I351 Nonrheumatic aortic (valve) insufficiency: Secondary | ICD-10-CM

## 2013-11-03 DIAGNOSIS — I480 Paroxysmal atrial fibrillation: Secondary | ICD-10-CM

## 2013-11-03 DIAGNOSIS — I359 Nonrheumatic aortic valve disorder, unspecified: Secondary | ICD-10-CM

## 2013-11-03 LAB — POCT INR: INR: 2

## 2013-11-24 ENCOUNTER — Ambulatory Visit (INDEPENDENT_AMBULATORY_CARE_PROVIDER_SITE_OTHER): Payer: Medicare Other | Admitting: Pharmacist Clinician (PhC)/ Clinical Pharmacy Specialist

## 2013-11-24 DIAGNOSIS — I482 Chronic atrial fibrillation, unspecified: Secondary | ICD-10-CM

## 2013-11-24 DIAGNOSIS — I480 Paroxysmal atrial fibrillation: Secondary | ICD-10-CM

## 2013-11-24 DIAGNOSIS — D509 Iron deficiency anemia, unspecified: Secondary | ICD-10-CM

## 2013-11-24 DIAGNOSIS — I359 Nonrheumatic aortic valve disorder, unspecified: Secondary | ICD-10-CM

## 2013-11-24 DIAGNOSIS — I4891 Unspecified atrial fibrillation: Secondary | ICD-10-CM

## 2013-11-24 DIAGNOSIS — I351 Nonrheumatic aortic (valve) insufficiency: Secondary | ICD-10-CM

## 2013-11-24 LAB — POCT INR: INR: 2.9

## 2013-11-29 ENCOUNTER — Other Ambulatory Visit: Payer: Self-pay | Admitting: Family Medicine

## 2013-11-30 ENCOUNTER — Encounter: Payer: Self-pay | Admitting: Family Medicine

## 2013-11-30 NOTE — Telephone Encounter (Signed)
immunizations updated

## 2013-12-02 ENCOUNTER — Ambulatory Visit (INDEPENDENT_AMBULATORY_CARE_PROVIDER_SITE_OTHER): Payer: Medicare Other | Admitting: Cardiovascular Disease

## 2013-12-02 VITALS — BP 140/64 | HR 60 | Ht 68.0 in | Wt 206.8 lb

## 2013-12-02 DIAGNOSIS — E785 Hyperlipidemia, unspecified: Secondary | ICD-10-CM

## 2013-12-02 DIAGNOSIS — I2510 Atherosclerotic heart disease of native coronary artery without angina pectoris: Secondary | ICD-10-CM

## 2013-12-02 DIAGNOSIS — I480 Paroxysmal atrial fibrillation: Secondary | ICD-10-CM

## 2013-12-02 NOTE — Patient Instructions (Signed)
Your physician wants you to follow-up in: 6 months or sooner if needed. You will receive a reminder letter in the mail two months in advance. If you don't receive a letter, please call our office to schedule the follow-up appointment. 

## 2013-12-03 ENCOUNTER — Encounter: Payer: Self-pay | Admitting: Cardiovascular Disease

## 2013-12-03 DIAGNOSIS — E785 Hyperlipidemia, unspecified: Secondary | ICD-10-CM | POA: Insufficient documentation

## 2013-12-03 NOTE — Progress Notes (Signed)
Patient ID: Joshua [name], male   DOB: September 18, 1945, 68 y.o.   MRN: [id number]      HPI: Joshua [name] is a 67 y.o. male presents to the office for a 6 month cardiology evaluation.  Joshua [name] has a history of hypertrophic obstructive cardiomyopathy, coronary artery disease and is status post stenting of his proximal RCA and has distal PDA occlusion with left-to-right collaterals as well as stenting of his proximal LAD. His last cardiac catheterization was in 2007 at which time both stents were patent. He also has a history of paroxysmal atrial fibrillation for which she's been on chronic Coumadin therapy and did have an episode of atrial flutter but has been maintaining sinus rhythm on Norvasc therapy.  His last nuclear perfusion study in April 2012 showed normal perfusion.   In the past, he also has had issues with significant peripheral edema and shortness of breath, but this has stabilized.  An echo Doppler study in April 2013 which confirmed severe concentric left ventricular hypertrophy. He had a resting left ventricle outflow track subvalvular gradient of 25 mm. Ejection fraction was hyperdynamic at 70%. He did have significant mitral annular calcification with mild mitral stenosis with mild MR as well as aortic valve sclerosis with mild to moderate aortic insufficiency.  A followup echo Doppler study on 05/18/2013 was eessentially unchanged from 2 years previously.  Ejection fraction is 60-65%.  His peak LVOT velocity was 2.6 m/s, there is severe asymmetric left hypertrophy with mild systolic anterior motion of the mitral valve and severe mitral annular calcification with mild MS. There is felt to be very mild aortic stenosis and aortic insufficiency. The left atrium was moderately dilated.  His PA pressure was 32 mm.  He has a history of hyperlipidemia and an NMR lipoprotein ldone by [name] 2 months ago showed continued improvement of his lipids with an LDL particle number of 763, calculated  LDL 63, HDL 44, triglycerides 113, total cholesterol 130, and small LDL particle #232.  Insulin resistance score was 46.  He denies recent chest pain.  He denies his previous peripheral edema or significant shortness of breath.  He denies bleeding on warfarin.  Past Medical History  Diagnosis Date  . Atrial fibrillation     persistent  . Coronary artery disease   . Hypercholesteremia   . Hypercholesteremia   . Iron deficiency anemia, unspecified   . Other B-complex deficiencies   . Hx of adenomatous colonic polyps 04-14-2003    Colonoscopy  . Gastritis 06-10-2006  . Hiatal hernia 06-10-2006    EGD  . Hard of hearing   . Rheumatic fever   . COPD (chronic obstructive pulmonary disease)     PFT 05-05-2010, FEV1 .9 (26%) ratio 60  . Hypertension   . Hypertrophic obstructive cardiomyopathy     mild subaortic stenosis  . LVH (left ventricular hypertrophy)     Past Surgical History  Procedure Laterality Date  . Coronary angioplasty      2002-2003  . Shoulder surgery    . Cardioversion  May 2012    [name]  . Cardiac catheterization  05/28/2005    Widely patent coronaries and normal LV function.  . Cardiac catheterization  08/11/2001    80% LAD stenosis successfully stented with a 3.5x25mm Cypher DES postdilated to 3.51mm resulting in reduction of 80% to 0%.  . Cardiac catheterization  01/15/2001    Focal 95% proximal RCA stenosis, stented with a 3x88mm Zeta multilink stent with postdilatation utilizing a  3.5x15mm Quantum balloon with stenosis being reduced to 0%.  . Cardiovascular stress test  05/29/2011    No scintigraphic evidence of inducible myocardial ischemia. No ECG changes. EKG negative for ischemia.  . Transthoracic echocardiogram  05/29/2011    EF >70%, severe concentric LV hypertrophy, severe mitral annular calcification, mild-moderate aortic regurg,     Allergies  Allergen Reactions  . Tetracycline     Current Outpatient Prescriptions  Medication Sig Dispense Refill    . acetaminophen (TYLENOL) 325 MG tablet Take 650 mg by mouth as directed.        Marland Kitchen aspirin 81 MG tablet Take 81 mg by mouth daily.        . Cholecalciferol (VITAMIN D3) 2000 UNITS TABS Take 1 tablet by mouth daily.       . CRESTOR 10 MG tablet TAKE ONE TABLET BY MOUTH ONCE DAILY  30 tablet  3  . disopyramide (NORPACE) 150 MG capsule TAKE TWO CAPSULES BY MOUTH TWICE DAILY  120 capsule  3  . hydrochlorothiazide (HYDRODIURIL) 25 MG tablet TAKE ONE TABLET BY MOUTH ONCE DAILY  90 tablet  1  . omeprazole (PRILOSEC) 20 MG capsule Take 1 capsule (20 mg total) by mouth daily.  30 capsule  11  . FLUZONE HIGH-DOSE 0.5 ML SUSY       . verapamil (CALAN) 120 MG tablet TAKE ONE TABLET BY MOUTH ONCE DAILY  30 tablet  2  . warfarin (COUMADIN) 5 MG tablet TAKE ONE & ONE-HALF TABLETS BY MOUTH ONCE DAILY AS DIRECTED  135 tablet  0   Current Facility-Administered Medications  Medication Dose Route Frequency Provider Last Rate Last Dose  . cyanocobalamin ((VITAMIN B-12)) injection 1,000 mcg  1,000 mcg Intramuscular Q30 days Chipper Herb, MD   1,000 mcg at 11/24/13 9518    History   Social History  . Marital Status: Widowed    Spouse Name: N/A    Number of Children: 51  . Years of Education: N/A   Occupational History  . retired Event organiser    Social History Main Topics  . Smoking status: Former Smoker -- 1.00 packs/day for 10 years    Types: Cigarettes    Quit date: 02/26/1985  . Smokeless tobacco: Never Used  . Alcohol Use: No  . Drug Use: No  . Sexual Activity: Not on file   Other Topics Concern  . Not on file   Social History Narrative   Lives in [facility]   Married with two sons and three daughters    Family History  Problem Relation Age of Onset  . Colon cancer Paternal Grandfather   . Heart attack Brother 25    Ventircular rupture  . Emphysema Brother   . Lung cancer Brother   . Heart disease Mother     Enlarged heart   Socially he tries to remain active. He does  kayak. He also does work in his yard. He is widowed. He has 4 children one deceased. One grandchild. Is no tobacco or alcohol use.  ROS General: Negative; No fevers, chills, or night sweats;  HEENT: Negative; No changes in vision or hearing, sinus congestion, difficulty swallowing Pulmonary: Negative; No cough, wheezing, shortness of breath, hemoptysis Cardiovascular: Negative; No chest pain, presyncope, syncope, palpitations GI: Negative; No nausea, vomiting, diarrhea, or abdominal pain GU: Negative; No dysuria, hematuria, or difficulty voiding Musculoskeletal: Negative; no myalgias, joint pain, or weakness Hematologic/Oncology: Positive for remote history of iron deficiency anemia no easy bruising, bleeding Endocrine: Negative; no  heat/cold intolerance; no diabetes Neuro: Negative; no changes in balance, headaches Skin: Brawny lower extremity skin changes from prior chronic edema;  No rashes or skin lesions Psychiatric: Negative; No behavioral problems, depression Sleep: Negative; No snoring, daytime sleepiness, hypersomnolence, bruxism, restless legs, hypnogognic hallucinations, no cataplexy Other comprehensive 14 point system review is negative.   PE BP 140/64  Pulse 60  Ht 5\' 8"  (1.727 m)  Wt 206 lb 12.8 oz (93.804 kg)  BMI 31.45 kg/m2  Repeat blood pressure by me 138/70 General: Alert, oriented, no distress.  Skin: normal turgor, no rashes HEENT: Normocephalic, atraumatic. Pupils round and reactive; sclera anicteric;no lid lag.  Nose without nasal septal hypertrophy Mouth/Parynx benign; Mallinpatti scale 3 Neck: No JVD, transmitted murmur to carotids Lungs: clear to ausculatation and percussion; no wheezing or rales Heart: RRR, s1 s2 normal 2/6 systolic murmur in the aortic region and a 1/6 diastolic murmur compatible with his aortic insufficiency. He does have mild bisferens pulse with murmurs transmitted to his carotids. Murmur increase with the squatting position. Abdomen:  Moderate central adiposity; soft, nontender; no hepatosplenomehaly, BS+; abdominal aorta nontender and not dilated by palpation. Pulses 2+ Extremities: Brawny stasis changes which are old without frank edema presently; no clubbing cyanosis, Homan's sign negative  Neurologic: grossly nonfocal Psychologic: normal affect and mood.  ECG (independently read by me): Sinus rhythm at 60 beats per minute with first degree AV block with a PR interval of 236 ms.  LDH with QRS widening.  QTC 514 ms.  Prior 06/15/2013 ECG (independently read by me and (sinus rhythm at 59 beats per minute.  First degree AV block with PR interval 220 ms.  Increased QTC of 500 ms.  Interventricular conduction delay.  Prior 10/ 22/2014 ECG: Sinus rhythm with first-degree AV block with PR interval 232 ms. QT interval 495 ms.  LABS:  BMET    Component Value Date/Time   NA 140 09/29/2013 0907   NA 139 08/20/2012 0930   K 4.2 09/29/2013 0907   CL 97 09/29/2013 0907   CO2 30* 09/29/2013 0907   GLUCOSE 79 09/29/2013 0907   GLUCOSE 77 08/20/2012 0930   BUN 21 09/29/2013 0907   BUN 25* 08/20/2012 0930   CREATININE 1.23 09/29/2013 0907   CREATININE 1.10 08/20/2012 0930   CALCIUM 9.1 09/29/2013 0907   GFRNONAA 60 09/29/2013 0907   GFRNONAA 69 08/20/2012 0930   GFRAA 69 09/29/2013 0907   GFRAA 80 08/20/2012 0930     Hepatic Function Panel     Component Value Date/Time   PROT 7.0 09/29/2013 0907   PROT 7.3 08/20/2012 0930   ALBUMIN 3.6 08/20/2012 0930   AST 25 09/29/2013 0907   ALT 21 09/29/2013 0907   ALKPHOS 61 09/29/2013 0907   BILITOT 0.5 09/29/2013 0907   BILIDIR 0.15 09/29/2013 0907   IBILI 0.7 08/20/2012 0930     CBC    Component Value Date/Time   WBC 5.8 09/29/2013 0944   WBC 6.1 05/21/2013 0921   WBC 5.4 07/03/2010 1528   RBC 5.10 09/29/2013 0944   RBC 5.1 05/21/2013 0921   RBC 4.43 07/03/2010 1528   HGB 15.8 09/29/2013 0944   HGB 15.3 05/21/2013 0921   HCT 46.8 09/29/2013 0944   HCT 47.4 05/21/2013 0921   PLT 137* 09/29/2013 0944   MCV 92  09/29/2013 0944   MCV 93.4 05/21/2013 0921   MCH 31.0 09/29/2013 0944   MCH 30.1 05/21/2013 0921   MCH 27.5 07/03/2010 1528   MCHC 33.8  09/29/2013 0944   MCHC 32.3 05/21/2013 0921   MCHC 31.1 07/03/2010 1528   RDW 14.1 09/29/2013 0944   RDW 14.3 07/03/2010 1528   LYMPHSABS 1.4 09/29/2013 0944   LYMPHSABS 1.2 03/31/2010 1739   MONOABS 0.1 03/31/2010 1739   EOSABS 0.1 09/29/2013 0944   EOSABS 0.2 03/31/2010 1739   BASOSABS 0.0 09/29/2013 0944   BASOSABS 0.0 03/31/2010 1739     BNP    Component Value Date/Time   PROBNP 541.0* 05/05/2010 1158    Lipid Panel     Component Value Date/Time   CHOL 130 09/29/2013 0907   TRIG 113 09/29/2013 0907   TRIG 94 08/20/2012 0930   HDL 44 09/29/2013 0907   LDLCALC 63 09/29/2013 0907   LDLCALC 66 08/20/2012 0930     RADIOLOGY: No results found.    ASSESSMENT AND PLAN: [name] is a 68 year old male who I have been following for over 20 years. He has a history of hypertrophic obstructive cardiomyopathy and his gradients have remained stable on subsequent echocardiographic evaluations.  He also is status post two-vessel coronary intervention and at last catheterization a patent stents in his LAD and proximal right coronary artery. He is maintaining sinus rhythm on his current dose of Norpace and has not had any breakthrough recurrent atrial fibrillation or atrial flutter. He also is on very low-dose verapamil. His blood pressure today is controlled when taken by me was 138/70. His last nuclear perfusion study in April 2013 which continued to show fairly normal perfusion.  Presently, he appears well compensated.  He's not having any recurrent edema in his low-dose HCTZ 25 mg daily.  He is tolerating Norpace.  QTc interval is slightly increased.  If this increases further dose adjustment will be necessary.  He is tolerating Crestor at 10 mg and his most recent NMR lipoprotein is excellent.  He's not having bleeding on his current dose of warfarin.  He will followup with [name].   I will see him in 6 months for reevaluation or sooner if problems arise.  Time spent: 25 minutes  Troy Sine, MD, [facility]  12/03/2013 6:19 PM

## 2013-12-06 ENCOUNTER — Other Ambulatory Visit: Payer: Self-pay | Admitting: Family Medicine

## 2013-12-23 ENCOUNTER — Other Ambulatory Visit: Payer: Self-pay | Admitting: Gastroenterology

## 2013-12-24 ENCOUNTER — Other Ambulatory Visit: Payer: Self-pay | Admitting: Gastroenterology

## 2013-12-25 ENCOUNTER — Telehealth: Payer: Self-pay | Admitting: Gastroenterology

## 2013-12-25 NOTE — Telephone Encounter (Signed)
Dr Fuller Plan, patient has an appointment with you on 01/06/14. May I refill his omeprazole until then?

## 2013-12-27 NOTE — Telephone Encounter (Signed)
Ok to refill 

## 2013-12-28 MED ORDER — OMEPRAZOLE 20 MG PO CPDR: 20.0000 mg | DELAYED_RELEASE_CAPSULE | Freq: Every day | ORAL | Status: DC

## 2013-12-28 NOTE — Addendum Note (Signed)
Addended by: Larina Bras on: 12/28/2013 10:27 AM   Modules accepted: Orders

## 2013-12-28 NOTE — Telephone Encounter (Signed)
Rx sent 

## 2014-01-04 ENCOUNTER — Other Ambulatory Visit: Payer: Self-pay | Admitting: Family Medicine

## 2014-01-06 ENCOUNTER — Encounter: Payer: Self-pay | Admitting: Gastroenterology

## 2014-01-06 ENCOUNTER — Ambulatory Visit (INDEPENDENT_AMBULATORY_CARE_PROVIDER_SITE_OTHER): Payer: Medicare Other | Admitting: Gastroenterology

## 2014-01-06 VITALS — BP 136/58 | HR 56 | Ht 67.25 in | Wt 207.5 lb

## 2014-01-06 DIAGNOSIS — K5900 Constipation, unspecified: Secondary | ICD-10-CM

## 2014-01-06 DIAGNOSIS — K219 Gastro-esophageal reflux disease without esophagitis: Secondary | ICD-10-CM

## 2014-01-06 DIAGNOSIS — Z86010 Personal history of colonic polyps: Secondary | ICD-10-CM

## 2014-01-06 MED ORDER — OMEPRAZOLE 20 MG PO CPDR: 20.0000 mg | DELAYED_RELEASE_CAPSULE | Freq: Every day | ORAL | Status: DC

## 2014-01-06 NOTE — Progress Notes (Signed)
    History of Present Illness: This is a 68 year old male formerly followed by Dr. Verl Blalock. He has chronic GERD and constipation. His reflux symptoms are under excellent control on daily omeprazole. He takes fiber supplements intermittently when constipated but states they work very slowly. He underwent colonoscopy in August 2013.  Current Medications, Allergies, Past Medical History, Past Surgical History, Family History and Social History were reviewed in [employer] record.  Physical Exam: General: Well developed , well nourished, no acute distress Head: Normocephalic and atraumatic Eyes:  sclerae anicteric, EOMI Ears: Normal auditory acuity Mouth: No deformity or lesions Lungs: Clear throughout to auscultation Heart: Regular rate and rhythm; no murmurs, rubs or bruits Abdomen: Soft, non tender and non distended. No masses, hepatosplenomegaly or hernias noted. Normal Bowel sounds Musculoskeletal: Symmetrical with no gross deformities  Pulses:  Normal pulses noted Extremities: No clubbing, cyanosis, edema or deformities noted Neurological: Alert oriented x 4, grossly nonfocal Psychological:  Alert and cooperative. Normal mood and affect  Assessment and Recommendations:  1. GERD. Continue omeprazole 20 mg daily and standard antireflux measures.  2. Mild chronic constipation. Increase dietary fiber and water intake. Begin FiberCon 4 tablets daily.  3. Personal history of adenomatous colon polyps. Five-year surveillance colonoscopy due August 2018.

## 2014-01-06 NOTE — Patient Instructions (Signed)
We have sent the following medications to your pharmacy for you to pick up at your convenience:  Omeprazole Use Fibercon 4 pills with water as needed, if this fails use Miralax as needed. Follow up in 1 year with Dr Fuller Plan. You are in our system to have a colonoscopy in 09/2016. CC:  Redge Gainer MD

## 2014-01-12 ENCOUNTER — Ambulatory Visit (INDEPENDENT_AMBULATORY_CARE_PROVIDER_SITE_OTHER): Payer: Medicare Other | Admitting: Pharmacist Clinician (PhC)/ Clinical Pharmacy Specialist

## 2014-01-12 DIAGNOSIS — I351 Nonrheumatic aortic (valve) insufficiency: Secondary | ICD-10-CM

## 2014-01-12 DIAGNOSIS — I482 Chronic atrial fibrillation, unspecified: Secondary | ICD-10-CM

## 2014-01-12 DIAGNOSIS — D509 Iron deficiency anemia, unspecified: Secondary | ICD-10-CM

## 2014-01-12 DIAGNOSIS — I480 Paroxysmal atrial fibrillation: Secondary | ICD-10-CM

## 2014-01-12 DIAGNOSIS — E538 Deficiency of other specified B group vitamins: Secondary | ICD-10-CM

## 2014-01-12 LAB — POCT INR: INR: 3.2

## 2014-01-12 NOTE — Progress Notes (Signed)
Patient ID: Joshua [name], male   DOB: 1945/09/07, 68 y.o.   MRN: [id number] Pt given B12 injection IM right deltoid, pt tolerated injection well.

## 2014-01-19 ENCOUNTER — Telehealth: Payer: Self-pay | Admitting: Cardiovascular Disease

## 2014-01-19 NOTE — Telephone Encounter (Signed)
Pt called in stating that he received a letter in the mail from [facility] telling him that his insurance would no longer cover Disopyramide and they are now suggesting that he take Amiodarone in the place of that. He has some concerns about taking that medication. He says that he will drop a copy of the letter off at the office today for Dr. Claiborne Billings to review.  Thanks

## 2014-01-19 NOTE — Telephone Encounter (Signed)
Message recieved

## 2014-01-29 ENCOUNTER — Ambulatory Visit (INDEPENDENT_AMBULATORY_CARE_PROVIDER_SITE_OTHER): Payer: Medicare Other | Admitting: Family Medicine

## 2014-01-29 ENCOUNTER — Encounter: Payer: Self-pay | Admitting: Family Medicine

## 2014-01-29 VITALS — BP 147/62 | HR 55 | Temp 96.9°F | Ht 67.25 in | Wt 205.0 lb

## 2014-01-29 DIAGNOSIS — I10 Essential (primary) hypertension: Secondary | ICD-10-CM

## 2014-01-29 DIAGNOSIS — I351 Nonrheumatic aortic (valve) insufficiency: Secondary | ICD-10-CM

## 2014-01-29 DIAGNOSIS — I482 Chronic atrial fibrillation, unspecified: Secondary | ICD-10-CM

## 2014-01-29 DIAGNOSIS — Z0000 Encounter for general adult medical examination without abnormal findings: Secondary | ICD-10-CM

## 2014-01-29 DIAGNOSIS — E785 Hyperlipidemia, unspecified: Secondary | ICD-10-CM

## 2014-01-29 DIAGNOSIS — E559 Vitamin D deficiency, unspecified: Secondary | ICD-10-CM

## 2014-01-29 DIAGNOSIS — I480 Paroxysmal atrial fibrillation: Secondary | ICD-10-CM

## 2014-01-29 DIAGNOSIS — N400 Enlarged prostate without lower urinary tract symptoms: Secondary | ICD-10-CM

## 2014-01-29 LAB — POCT URINALYSIS DIPSTICK
Bilirubin, UA: NEGATIVE
Glucose, UA: NEGATIVE
Ketones, UA: NEGATIVE
Nitrite, UA: NEGATIVE
Spec Grav, UA: 1.015
Urobilinogen, UA: NEGATIVE
pH, UA: 6.5

## 2014-01-29 LAB — POCT UA - MICROSCOPIC ONLY
Bacteria, U Microscopic: NEGATIVE
Casts, Ur, LPF, POC: NEGATIVE
Crystals, Ur, HPF, POC: NEGATIVE
Yeast, UA: NEGATIVE

## 2014-01-29 LAB — POCT INR: INR: 2.1

## 2014-01-29 MED ORDER — VERAPAMIL HCL 120 MG PO TABS: 120.0000 mg | ORAL_TABLET | Freq: Every day | ORAL | Status: DC

## 2014-01-29 MED ORDER — HYDROCHLOROTHIAZIDE 25 MG PO TABS: 25.0000 mg | ORAL_TABLET | Freq: Every day | ORAL | Status: DC

## 2014-01-29 NOTE — Patient Instructions (Addendum)
Medicare Annual Wellness Visit  [facility] and the medical providers at [facility] strive to bring you the best medical care.  In doing so we not only want to address your current medical conditions and concerns but also to detect new conditions early and prevent illness, disease and health-related problems.    Medicare offers a yearly Wellness Visit which allows our clinical staff to assess your need for preventative services including immunizations, lifestyle education, counseling to decrease risk of preventable diseases and screening for fall risk and other medical concerns.    This visit is provided free of charge (no copay) for all Medicare recipients. The clinical pharmacists at [facility] have begun to conduct these Wellness Visits which will also include a thorough review of all your medications.    As you primary medical provider recommend that you make an appointment for your Annual Wellness Visit if you have not done so already this year.  You may set up this appointment before you leave today or you may call back ([phone]) and schedule an appointment.  Please make sure when you call that you mention that you are scheduling your Annual Wellness Visit with the clinical pharmacist so that the appointment may be made for the proper length of time.      Continue current medications. Continue good therapeutic lifestyle changes which include good diet and exercise. Fall precautions discussed with patient. If an FOBT was given today- please return it to our front desk. If you are over 34 years old - you may need Prevnar 34 or the adult Pneumonia vaccine.  Flu Shots will be available at our office starting mid- September. Please call and schedule a FLU CLINIC APPOINTMENT.   Continue current medications Continue regular physical activity Continue regular follow-up with cardiology Continue to use a nasal steroid like  Flonase or Nasacort Use Mucinex maximum strength blue and white in color 1 twice daily with a large glass of water for cough and congestion Gargle with warm salty water and rest your voice   Anticoagulation Dose Instructions as of 01/29/2014      [name] Tue Wed Thu Fri Sat   New Dose 5 mg 5 mg 5 mg 5 mg 5 mg 5 mg 5 mg    Description        Take 1/2 tablet today and then change to 1 tablet every day of the week

## 2014-01-29 NOTE — Addendum Note (Signed)
Addended by: Earlene Plater on: 01/29/2014 09:19 AM   Modules accepted: Orders

## 2014-01-29 NOTE — Progress Notes (Signed)
Subjective:    Patient ID: Joshua [name], male    DOB: [birth year], 68 y.o.   MRN: [id number]  HPI Patient is here today for annual wellness exam and follow up of chronic medical problems. The patient is doing well with no specific complaints other than some hoarseness. He sees a cardiologist, Dr. Claiborne Billings about every 6 months. He is up-to-date on his health maintenance issues. He stays active physically. He does need refills on a couple of medications.         Patient Active Problem List   Diagnosis Date Noted  . Hyperlipidemia LDL goal <70 12/03/2013  . High risk medication use 05/21/2013  . BPH (benign prostatic hyperplasia) 01/29/2013  . History of hematuria 01/29/2013  . Family history of colon cancer 01/29/2013  . PAF (paroxysmal atrial fibrillation) 12/17/2012  . Aortic valve insufficiency 12/17/2012  . Mild mitral stenosis 12/17/2012  . Pulmonary infiltrates 07/17/2010  . COPD UNSPECIFIED 05/05/2010  . HYPERTROPHIC OBSTRUCTIVE CARDIOMYOPATHY 03/31/2010  . ATRIAL FLUTTER 03/31/2010  . HEMOCCULT POSITIVE STOOL 10/12/2008  . B12 DEFICIENCY 10/11/2008  . HYPERCHOLESTEROLEMIA 10/11/2008  . ANEMIA, IRON DEFICIENCY 10/11/2008  . Coronary atherosclerosis 10/11/2008  . FIBRILLATION, ATRIAL 10/11/2008  . GASTRITIS 10/11/2008  . HIATAL HERNIA WITH REFLUX 10/11/2008  . COLONIC POLYPS, ADENOMATOUS, HX OF 10/11/2008   Outpatient Encounter Prescriptions as of 01/29/2014  Medication Sig  . acetaminophen (TYLENOL) 325 MG tablet Take 650 mg by mouth as directed.    Marland Kitchen aspirin 81 MG tablet Take 81 mg by mouth daily.    . Cholecalciferol (VITAMIN D3) 2000 UNITS TABS Take 1 tablet by mouth daily.   . CRESTOR 10 MG tablet TAKE ONE TABLET BY MOUTH ONCE DAILY  . disopyramide (NORPACE) 150 MG capsule TAKE TWO CAPSULES BY MOUTH TWICE DAILY  . hydrochlorothiazide (HYDRODIURIL) 25 MG tablet Take 1 tablet (25 mg total) by mouth daily.  Marland Kitchen omeprazole (PRILOSEC) 20 MG capsule Take 1 capsule (20 mg  total) by mouth daily.  . verapamil (CALAN) 120 MG tablet Take 1 tablet (120 mg total) by mouth daily.  Marland Kitchen warfarin (COUMADIN) 5 MG tablet TAKE ONE & ONE-HALF TABLETS BY MOUTH ONCE DAILY AS DIRECTED  . [DISCONTINUED] FLUZONE HIGH-DOSE 0.5 ML SUSY   . [DISCONTINUED] hydrochlorothiazide (HYDRODIURIL) 25 MG tablet TAKE ONE TABLET BY MOUTH ONCE DAILY  . [DISCONTINUED] verapamil (CALAN) 120 MG tablet TAKE ONE TABLET BY MOUTH ONCE DAILY  . [DISCONTINUED] ciprofloxacin (CIPRO) 500 MG tablet Take 1 tablet (500 mg total) by mouth 2 (two) times daily.  . [DISCONTINUED] CRESTOR 10 MG tablet TAKE ONE TABLET BY MOUTH ONCE DAILY  . [DISCONTINUED] omeprazole (PRILOSEC) 20 MG capsule Take 1 capsule (20 mg total) by mouth daily.  . [DISCONTINUED] verapamil (CALAN) 120 MG tablet TAKE ONE TABLET BY MOUTH ONCE DAILY  . [DISCONTINUED] warfarin (COUMADIN) 5 MG tablet TAKE ONE & ONE-HALF TABLETS BY MOUTH EVERY DAY AS DIRECTED    Review of Systems  Constitutional: Negative.   HENT: Negative.        Hoarseness   Eyes: Negative.   Respiratory: Negative.   Cardiovascular: Negative.   Gastrointestinal: Negative.   Endocrine: Negative.   Genitourinary: Negative.   Musculoskeletal: Negative.   Skin: Negative.   Allergic/Immunologic: Negative.   Neurological: Negative.   Hematological: Negative.   Psychiatric/Behavioral: Negative.        Objective:   Physical Exam  Constitutional: He is oriented to person, place, and time. He appears well-developed and well-nourished.  The patient is pleasant  and cooperative and alert.  HENT:  Head: Normocephalic and atraumatic.  Right Ear: External ear normal.  Left Ear: External ear normal.  Nose: Nose normal.  Mouth/Throat: Oropharynx is clear and moist. No oropharyngeal exudate.  Slight nasal congestion left greater than right  Eyes: Conjunctivae and EOM are normal. Pupils are equal, round, and reactive to light. Right eye exhibits no discharge. Left eye exhibits no  discharge. No scleral icterus.  Neck: Normal range of motion. Neck supple. No thyromegaly present.  No carotid bruits or anterior cervical adenopathy  Cardiovascular: Normal rate, regular rhythm, normal heart sounds and intact distal pulses.   No murmur heard. The heart sounds were distant. The rate was about 60/m and appeared to be regular today.  Pulmonary/Chest: Effort normal and breath sounds normal. No respiratory distress. He has no wheezes. He has no rales. He exhibits no tenderness.  There is no axillary adenopathy  Abdominal: Soft. Bowel sounds are normal. He exhibits no mass. There is no tenderness. There is no rebound and no guarding.  The abdomen is obese without masses tenderness or inguinal adenopathy.  Genitourinary: Rectum normal and penis normal.  The prostate is somewhat irregular but soft without masses. It is slightly enlarged. There were no inguinal hernias palpable or inguinal nodes. Her was an external hemorrhoid.  Musculoskeletal: Normal range of motion. He exhibits no edema or tenderness.  Lymphadenopathy:    He has no cervical adenopathy.  Neurological: He is alert and oriented to person, place, and time. He has normal reflexes. No cranial nerve deficit.  Skin: Skin is warm and dry. No rash noted. No erythema. No pallor.  Psychiatric: He has a normal mood and affect. His behavior is normal. Judgment and thought content normal.  Nursing note and vitals reviewed.  BP 147/61 mmHg  Pulse 55  Temp(Src) 96.9 F (36.1 C) (Oral)  Ht 5' 7.25" (1.708 m)  Wt 205 lb (92.987 kg)  BMI 31.87 kg/m2        Assessment & Plan:  1. PAF (paroxysmal atrial fibrillation) - POCT CBC - POCT INR  2. BPH (benign prostatic hyperplasia) - POCT CBC - PSA, total and free - POCT UA - Microscopic Only - POCT urinalysis dipstick - Urine culture  3. Vitamin D deficiency - POCT CBC - Vit D  25 hydroxy (rtn osteoporosis monitoring)  4. Hyperlipemia - POCT CBC - NMR,  lipoprofile  5. Essential hypertension - POCT CBC - BMP8+EGFR - Hepatic function panel  6. Annual physical exam - POCT CBC - BMP8+EGFR - Hepatic function panel - NMR, lipoprofile - PSA, total and free - POCT UA - Microscopic Only - POCT urinalysis dipstick - Vit D  25 hydroxy (rtn osteoporosis monitoring) - Thyroid Panel With TSH - Urine culture  7. Aortic valve insufficiency - POCT INR  Meds ordered this encounter  Medications  . hydrochlorothiazide (HYDRODIURIL) 25 MG tablet    Sig: Take 1 tablet (25 mg total) by mouth daily.    Dispense:  90 tablet    Refill:  1  . verapamil (CALAN) 120 MG tablet    Sig: Take 1 tablet (120 mg total) by mouth daily.    Dispense:  90 tablet    Refill:  1   Patient Instructions                       Medicare Annual Wellness Visit  [facility] and the medical providers at [facility] strive to bring you the  best medical care.  In doing so we not only want to address your current medical conditions and concerns but also to detect new conditions early and prevent illness, disease and health-related problems.    Medicare offers a yearly Wellness Visit which allows our clinical staff to assess your need for preventative services including immunizations, lifestyle education, counseling to decrease risk of preventable diseases and screening for fall risk and other medical concerns.    This visit is provided free of charge (no copay) for all Medicare recipients. The clinical pharmacists at [facility] have begun to conduct these Wellness Visits which will also include a thorough review of all your medications.    As you primary medical provider recommend that you make an appointment for your Annual Wellness Visit if you have not done so already this year.  You may set up this appointment before you leave today or you may call back [phone]) and schedule an appointment.  Please make sure when you call  that you mention that you are scheduling your Annual Wellness Visit with the clinical pharmacist so that the appointment may be made for the proper length of time.      Continue current medications. Continue good therapeutic lifestyle changes which include good diet and exercise. Fall precautions discussed with patient. If an FOBT was given today- please return it to our front desk. If you are over 73 years old - you may need Prevnar 67 or the adult Pneumonia vaccine.  Flu Shots will be available at our office starting mid- September. Please call and schedule a FLU CLINIC APPOINTMENT.   Continue current medications Continue regular physical activity Continue regular follow-up with cardiology Continue to use a nasal steroid like Flonase or Nasacort Use Mucinex maximum strength blue and white in color 1 twice daily with a large glass of water for cough and congestion Gargle with warm salty water and rest your voice   Arrie Senate MD

## 2014-01-30 ENCOUNTER — Encounter: Payer: Self-pay | Admitting: Family Medicine

## 2014-01-30 DIAGNOSIS — D696 Thrombocytopenia, unspecified: Secondary | ICD-10-CM | POA: Insufficient documentation

## 2014-01-30 LAB — CBC WITH DIFFERENTIAL
Basophils Absolute: 0 10*3/uL (ref 0.0–0.2)
Basos: 1 %
Eos: 3 %
Eosinophils Absolute: 0.2 10*3/uL (ref 0.0–0.4)
HCT: 47.2 % (ref 37.5–51.0)
Hemoglobin: 15.5 g/dL (ref 12.6–17.7)
Immature Grans (Abs): 0 10*3/uL (ref 0.0–0.1)
Immature Granulocytes: 0 %
Lymphocytes Absolute: 1.4 10*3/uL (ref 0.7–3.1)
Lymphs: 24 %
MCH: 31 pg (ref 26.6–33.0)
MCHC: 32.8 g/dL (ref 31.5–35.7)
MCV: 94 fL (ref 79–97)
Monocytes Absolute: 0.4 10*3/uL (ref 0.1–0.9)
Monocytes: 7 %
Neutrophils Absolute: 3.9 10*3/uL (ref 1.4–7.0)
Neutrophils Relative %: 65 %
Platelets: 133 10*3/uL — ABNORMAL LOW (ref 150–379)
RBC: 5 x10E6/uL (ref 4.14–5.80)
RDW: 13.9 % (ref 12.3–15.4)
WBC: 5.9 10*3/uL (ref 3.4–10.8)

## 2014-01-30 LAB — HEPATIC FUNCTION PANEL
ALT: 22 IU/L (ref 0–44)
AST: 26 IU/L (ref 0–40)
Albumin: 3.8 g/dL (ref 3.6–4.8)
Alkaline Phosphatase: 58 IU/L (ref 39–117)
Bilirubin, Direct: 0.15 mg/dL (ref 0.00–0.40)
Total Bilirubin: 0.5 mg/dL (ref 0.0–1.2)
Total Protein: 7 g/dL (ref 6.0–8.5)

## 2014-01-30 LAB — THYROID PANEL WITH TSH
Free Thyroxine Index: 1.8 (ref 1.2–4.9)
T3 Uptake Ratio: 27 % (ref 24–39)
T4, Total: 6.5 ug/dL (ref 4.5–12.0)
TSH: 2.62 u[IU]/mL (ref 0.450–4.500)

## 2014-01-30 LAB — NMR, LIPOPROFILE
Cholesterol: 135 mg/dL (ref 100–199)
HDL Cholesterol by NMR: 44 mg/dL (ref >39)
HDL Particle Number: 33.9 umol/L (ref >=30.5)
LDL Particle Number: 933 nmol/L (ref <1000)
LDL Size: 20.3 nm (ref >20.5)
LDL-C: 69 mg/dL (ref 0–99)
LP-IR Score: 55 — ABNORMAL HIGH (ref <=45)
Small LDL Particle Number: 536 nmol/L — ABNORMAL HIGH (ref <=527)
Triglycerides by NMR: 112 mg/dL (ref 0–149)

## 2014-01-30 LAB — BMP8+EGFR
BUN/Creatinine Ratio: 17 (ref 10–22)
BUN: 20 mg/dL (ref 8–27)
CO2: 31 mmol/L — ABNORMAL HIGH (ref 18–29)
Calcium: 8.9 mg/dL (ref 8.6–10.2)
Chloride: 97 mmol/L (ref 97–108)
Creatinine, Ser: 1.17 mg/dL (ref 0.76–1.27)
GFR calc Af Amer: 73 mL/min/{1.73_m2} (ref >59)
GFR calc non Af Amer: 63 mL/min/{1.73_m2} (ref >59)
Glucose: 84 mg/dL (ref 65–99)
Potassium: 4.5 mmol/L (ref 3.5–5.2)
Sodium: 141 mmol/L (ref 134–144)

## 2014-01-30 LAB — URINE CULTURE

## 2014-01-30 LAB — PSA, TOTAL AND FREE
PSA, Free Pct: 40 %
PSA, Free: 0.12 ng/mL
PSA: 0.3 ng/mL (ref 0.0–4.0)

## 2014-01-30 LAB — VITAMIN D 25 HYDROXY (VIT D DEFICIENCY, FRACTURES): Vit D, 25-Hydroxy: 48.4 ng/mL (ref 30.0–100.0)

## 2014-02-01 ENCOUNTER — Telehealth: Payer: Self-pay | Admitting: Family Medicine

## 2014-02-01 NOTE — Telephone Encounter (Signed)
-----   Message from Chipper Herb, MD sent at 01/30/2014  9:46 AM EST ----- The blood sugar is good at 84. The creatinine, the most important kidney function test is within normal limits. The electrolytes including potassium are within normal limits. All liver function tests are within normal limits. Cholesterol numbers with advanced lipid testing are excellent and at goal. The total LDL particle number is 933. The LDL C is 69. The triglycerides are 112. The HDL particle number or the good cholesterol is good.------- continue Crestor and aggressive therapeutic lifestyle changes PSA remains low and within normal limits The vitamin D level is excellent, continue current treatment All thyroid tests are within normal limits The CBC has a normal white blood cell count. The hemoglobin is good at 15.5. The platelet count remains slightly decreased at 133,000. We will continue to monitor this.

## 2014-02-02 ENCOUNTER — Telehealth: Payer: Self-pay | Admitting: Cardiovascular Disease

## 2014-02-02 NOTE — Telephone Encounter (Signed)
Pt called in stating that he dropped off some paperwork to our office on 11/30 for Dr. Claiborne Billings to sign in regards to Joshua [name] continuing to take Disopryramide. His insurance will not cover this medication in 2016 and the pt would like to know what to do. Please call  Thanks

## 2014-02-02 NOTE — Telephone Encounter (Signed)
Dr. Claiborne Billings , this pt states he needs something in place of dispryramide ,that his insurance will not pay for it in 2016

## 2014-02-10 NOTE — Telephone Encounter (Signed)
Joshua [name] have you seen any paper work from this patient concerning his dispryramide continuation, Dr. Claiborne Billings doen't want pt to come off of it

## 2014-02-10 NOTE — Telephone Encounter (Signed)
With his HOCM would prefer that he stay on this generic med

## 2014-02-11 ENCOUNTER — Other Ambulatory Visit: Payer: Self-pay | Admitting: Cardiovascular Disease

## 2014-02-11 ENCOUNTER — Telehealth: Payer: Self-pay | Admitting: *Deleted

## 2014-02-11 NOTE — Telephone Encounter (Signed)
Called patient on 02-02-14 and left message.  No response.

## 2014-02-11 NOTE — Telephone Encounter (Signed)
Rx was sent to pharmacy electronically. 

## 2014-02-12 ENCOUNTER — Ambulatory Visit (INDEPENDENT_AMBULATORY_CARE_PROVIDER_SITE_OTHER): Payer: Medicare Other | Admitting: *Deleted

## 2014-02-12 DIAGNOSIS — D509 Iron deficiency anemia, unspecified: Secondary | ICD-10-CM

## 2014-02-12 DIAGNOSIS — E538 Deficiency of other specified B group vitamins: Secondary | ICD-10-CM

## 2014-02-12 NOTE — Telephone Encounter (Signed)
No, I have not. The insurance companies usually this time of the year will send a notice of medications that will not be covered in the following year. We will wait until we receive notification from the insurance company and address at that time.

## 2014-02-12 NOTE — Patient Instructions (Signed)

## 2014-02-12 NOTE — Progress Notes (Signed)
Vitamin b12 injection given and tolerated well.  

## 2014-03-09 ENCOUNTER — Ambulatory Visit (INDEPENDENT_AMBULATORY_CARE_PROVIDER_SITE_OTHER): Payer: Medicare Other | Admitting: Pharmacist Clinician (PhC)/ Clinical Pharmacy Specialist

## 2014-03-09 DIAGNOSIS — I482 Chronic atrial fibrillation, unspecified: Secondary | ICD-10-CM

## 2014-03-09 DIAGNOSIS — I480 Paroxysmal atrial fibrillation: Secondary | ICD-10-CM

## 2014-03-09 DIAGNOSIS — I351 Nonrheumatic aortic (valve) insufficiency: Secondary | ICD-10-CM

## 2014-03-09 LAB — POCT INR: INR: 3.1

## 2014-03-09 NOTE — Patient Instructions (Signed)
Anticoagulation Dose Instructions as of 03/09/2014      [name] Tue Wed Thu Fri Sat   New Dose 5 mg 5 mg 5 mg 2.5 mg 5 mg 5 mg 2.5 mg    Description        Decrease to 1/2 tablet on Wednesdays and Saturdays and 1 tablet all other days of the week.

## 2014-04-03 ENCOUNTER — Other Ambulatory Visit: Payer: Self-pay | Admitting: Family Medicine

## 2014-04-07 ENCOUNTER — Ambulatory Visit (INDEPENDENT_AMBULATORY_CARE_PROVIDER_SITE_OTHER): Payer: Medicare Other | Admitting: *Deleted

## 2014-04-07 DIAGNOSIS — D509 Iron deficiency anemia, unspecified: Secondary | ICD-10-CM

## 2014-04-07 DIAGNOSIS — E538 Deficiency of other specified B group vitamins: Secondary | ICD-10-CM

## 2014-04-07 NOTE — Progress Notes (Signed)
Vitamin b12 given and tolerated well. 

## 2014-04-07 NOTE — Patient Instructions (Signed)

## 2014-04-08 ENCOUNTER — Ambulatory Visit: Payer: Medicare Other

## 2014-04-20 ENCOUNTER — Ambulatory Visit (INDEPENDENT_AMBULATORY_CARE_PROVIDER_SITE_OTHER): Payer: Medicare Other | Admitting: Pharmacist Clinician (PhC)/ Clinical Pharmacy Specialist

## 2014-04-20 DIAGNOSIS — I482 Chronic atrial fibrillation, unspecified: Secondary | ICD-10-CM

## 2014-04-20 DIAGNOSIS — I351 Nonrheumatic aortic (valve) insufficiency: Secondary | ICD-10-CM

## 2014-04-20 DIAGNOSIS — I480 Paroxysmal atrial fibrillation: Secondary | ICD-10-CM

## 2014-04-20 LAB — POCT INR: INR: 1.3

## 2014-04-20 NOTE — Patient Instructions (Signed)
Anticoagulation Dose Instructions as of 04/20/2014      Joshua [name] Tue Wed Thu Fri Sat   New Dose 5 mg 5 mg 5 mg 5 mg 5 mg 5 mg 5 mg    Description        Take 1 1/2 tablet today.  Then Increase to 1 tablet (5mg ) a day.

## 2014-04-27 ENCOUNTER — Telehealth: Payer: Self-pay | Admitting: *Deleted

## 2014-04-27 NOTE — Telephone Encounter (Signed)
Entered in error

## 2014-05-03 ENCOUNTER — Telehealth (HOSPITAL_COMMUNITY): Payer: Self-pay | Admitting: Cardiovascular Disease

## 2014-05-03 NOTE — Telephone Encounter (Signed)
Received walk in form needing prior authorization for Norpace.[employer] called [phone]. Member ID # [id number].Prior authorization sent for clinical review.Will be notified of approval within 72 hours.

## 2014-05-03 NOTE — Telephone Encounter (Signed)
Received voice mail in medical records department at 4.56 pm on Friday March 4.  Patient saying he dropped off form on 04/27/14 requesting med change.  Patient had completed Patient Walk In Sheet on 04/27/14.  Wants a call back regarding this.  Phone message forwarded to Dr Claiborne Billings and Barry Brunner

## 2014-05-04 ENCOUNTER — Ambulatory Visit (INDEPENDENT_AMBULATORY_CARE_PROVIDER_SITE_OTHER): Payer: Medicare Other | Admitting: Pharmacist Clinician (PhC)/ Clinical Pharmacy Specialist

## 2014-05-04 DIAGNOSIS — I480 Paroxysmal atrial fibrillation: Secondary | ICD-10-CM

## 2014-05-04 DIAGNOSIS — I482 Chronic atrial fibrillation, unspecified: Secondary | ICD-10-CM

## 2014-05-04 DIAGNOSIS — I351 Nonrheumatic aortic (valve) insufficiency: Secondary | ICD-10-CM

## 2014-05-04 LAB — POCT INR: INR: 1.5

## 2014-05-04 NOTE — Patient Instructions (Signed)
Anticoagulation Dose Instructions as of 05/04/2014      Joshua [name] Tue Wed Thu Fri Sat   New Dose 5 mg 5 mg 7.5 mg 5 mg 7.5 mg 5 mg 7.5 mg    Description        Increase warfarin on Tuesdays, Thursdays, and Saturdays.

## 2014-05-11 ENCOUNTER — Ambulatory Visit (INDEPENDENT_AMBULATORY_CARE_PROVIDER_SITE_OTHER): Payer: Medicare Other | Admitting: *Deleted

## 2014-05-11 DIAGNOSIS — E538 Deficiency of other specified B group vitamins: Secondary | ICD-10-CM | POA: Diagnosis not present

## 2014-05-11 DIAGNOSIS — D509 Iron deficiency anemia, unspecified: Secondary | ICD-10-CM | POA: Diagnosis not present

## 2014-05-11 NOTE — Patient Instructions (Signed)

## 2014-05-11 NOTE — Progress Notes (Signed)
Vitamin b12 injection given and tolerated well.  

## 2014-05-18 ENCOUNTER — Ambulatory Visit (INDEPENDENT_AMBULATORY_CARE_PROVIDER_SITE_OTHER): Payer: Medicare Other | Admitting: Pharmacist Clinician (PhC)/ Clinical Pharmacy Specialist

## 2014-05-18 DIAGNOSIS — I482 Chronic atrial fibrillation, unspecified: Secondary | ICD-10-CM

## 2014-05-18 DIAGNOSIS — I351 Nonrheumatic aortic (valve) insufficiency: Secondary | ICD-10-CM | POA: Diagnosis not present

## 2014-05-18 DIAGNOSIS — I480 Paroxysmal atrial fibrillation: Secondary | ICD-10-CM | POA: Diagnosis not present

## 2014-05-18 LAB — POCT INR: INR: 2.7

## 2014-06-02 ENCOUNTER — Ambulatory Visit (INDEPENDENT_AMBULATORY_CARE_PROVIDER_SITE_OTHER): Payer: Medicare Other | Admitting: Cardiovascular Disease

## 2014-06-02 VITALS — BP 132/60 | HR 56 | Ht 68.5 in | Wt 205.8 lb

## 2014-06-02 DIAGNOSIS — I2510 Atherosclerotic heart disease of native coronary artery without angina pectoris: Secondary | ICD-10-CM

## 2014-06-02 DIAGNOSIS — I482 Chronic atrial fibrillation, unspecified: Secondary | ICD-10-CM

## 2014-06-02 DIAGNOSIS — I351 Nonrheumatic aortic (valve) insufficiency: Secondary | ICD-10-CM

## 2014-06-02 DIAGNOSIS — I421 Obstructive hypertrophic cardiomyopathy: Secondary | ICD-10-CM

## 2014-06-02 DIAGNOSIS — E785 Hyperlipidemia, unspecified: Secondary | ICD-10-CM | POA: Diagnosis not present

## 2014-06-02 DIAGNOSIS — I480 Paroxysmal atrial fibrillation: Secondary | ICD-10-CM

## 2014-06-02 NOTE — Patient Instructions (Signed)
Your physician wants you to follow-up in: 1 year or sooner if needed with Dr. Kelly. You will receive a reminder letter in the mail two months in advance. If you don't receive a letter, please call our office to schedule the follow-up appointment. 

## 2014-06-04 ENCOUNTER — Encounter: Payer: Self-pay | Admitting: Cardiovascular Disease

## 2014-06-04 NOTE — Progress Notes (Signed)
Patient ID: Joshua [name], male   DOB: [birth year], 69 y.o.   MRN: [id number]      HPI: Joshua [name] is a 69 y.o. male presents to the office for a 6 month cardiology evaluation.  Joshua [name] has a history of hypertrophic obstructive cardiomyopathy, CAD and is status post stenting of his proximal RCA and has distal PDA occlusion with left-to-right collaterals as well as stenting of his proximal LAD. His last cardiac catheterization in 2007 revealed both stents patent. He  has a history of paroxysmal atrial fibrillation for which she's been on chronic Coumadin therapy and did have an episode of atrial flutter but has been maintaining sinus rhythm on Norvasc therapy.  His last nuclear perfusion study in April 2012 showed normal perfusion.   In the past, he has had issues with significant peripheral edema and shortness of breath, but this has stabilized.  An echo Doppler study in April 2013 confirmed severe concentric left ventricular hypertrophy. He had a resting left ventricle outflow track subvalvular gradient of 25 mm. Ejection fraction was hyperdynamic at 70%. He did have significant mitral annular calcification with mild mitral stenosis with mild MR as well as aortic valve sclerosis with mild to moderate aortic insufficiency.  A followup echo Doppler study on 05/18/2013 was eessentially unchanged from 2 years previously.  Ejection fraction is 60-65%.  His peak LVOT velocity was 2.6 m/s, there is severe asymmetric left hypertrophy with mild systolic anterior motion of the mitral valve and severe mitral annular calcification with mild MS. There is felt to be very mild aortic stenosis and aortic insufficiency. The left atrium was moderately dilated.  His PA pressure was 32 mm.  He has a history of hyperlipidemia and an NMR lipoprotein ldone by [name] last year showed continued improvement of his lipids with an LDL particle number of 763, calculated LDL 63, HDL 44, triglycerides 113, total cholesterol  130, and small LDL particle #232.  Insulin resistance score was 46.  He denies recent chest pain.  He is unaware of any recent palpitations.  He denies PND, orthopnea.  He denies significant leg swelling.  He denies his previous peripheral edema or significant shortness of breath.  He denies bleeding on warfarin.  He has history of GERD and has been on long-term omeprazole. He will be switched to H2 blocker therapy  Past Medical History  Diagnosis Date  . Atrial fibrillation     persistent  . Coronary artery disease   . Hypercholesteremia   . Hypercholesteremia   . Iron deficiency anemia, unspecified   . Other B-complex deficiencies   . Hx of adenomatous colonic polyps 04-14-2003    Colonoscopy  . Gastritis 06-10-2006  . Hiatal hernia 06-10-2006    EGD  . Hard of hearing   . Rheumatic fever   . COPD (chronic obstructive pulmonary disease)     PFT 05-05-2010, FEV1 .9 (26%) ratio 60  . Hypertension   . Hypertrophic obstructive cardiomyopathy     mild subaortic stenosis  . LVH (left ventricular hypertrophy)     Past Surgical History  Procedure Laterality Date  . Coronary angioplasty      2002-2003  . Shoulder surgery    . Cardioversion  May 2012    [name]  . Cardiac catheterization  05/28/2005    Widely patent coronaries and normal LV function.  . Cardiac catheterization  08/11/2001    80% LAD stenosis successfully stented with a 3.5x34mm Cypher DES postdilated to 3.54mm resulting in  reduction of 80% to 0%.  . Cardiac catheterization  01/15/2001    Focal 95% proximal RCA stenosis, stented with a 3x72mm Zeta multilink stent with postdilatation utilizing a 3.5x31mm Quantum balloon with stenosis being reduced to 0%.  . Cardiovascular stress test  05/29/2011    No scintigraphic evidence of inducible myocardial ischemia. No ECG changes. EKG negative for ischemia.  . Transthoracic echocardiogram  05/29/2011    EF >70%, severe concentric LV hypertrophy, severe mitral annular calcification,  mild-moderate aortic regurg,     Allergies  Allergen Reactions  . Tetracycline     Current Outpatient Prescriptions  Medication Sig Dispense Refill  . acetaminophen (TYLENOL) 325 MG tablet Take 650 mg by mouth as directed.      Marland Kitchen aspirin 81 MG tablet Take 81 mg by mouth daily.      . Cholecalciferol (VITAMIN D3) 2000 UNITS TABS Take 1 tablet by mouth daily.     . CRESTOR 10 MG tablet TAKE ONE TABLET BY MOUTH ONCE DAILY 30 tablet 3  . disopyramide (NORPACE) 150 MG capsule TAKE TWO CAPSULES BY MOUTH TWICE DAILY 120 capsule 3  . hydrochlorothiazide (HYDRODIURIL) 25 MG tablet Take 1 tablet (25 mg total) by mouth daily. 90 tablet 1  . omeprazole (PRILOSEC) 20 MG capsule Take 1 capsule (20 mg total) by mouth daily. 30 capsule 11  . verapamil (CALAN) 120 MG tablet Take 1 tablet (120 mg total) by mouth daily. 90 tablet 1  . warfarin (COUMADIN) 5 MG tablet TAKE ONE & ONE-HALF TABLETS BY MOUTH ONCE DAILY AS DIRECTED 135 tablet 0   No current facility-administered medications for this visit.    History   Social History  . Marital Status: Widowed    Spouse Name: N/A  . Number of Children: 5  . Years of Education: N/A   Occupational History  . retired Event organiser    Social History Main Topics  . Smoking status: Former Smoker -- 1.00 packs/day for 10 years    Types: Cigarettes    Quit date: 02/26/1985  . Smokeless tobacco: Never Used  . Alcohol Use: No  . Drug Use: No  . Sexual Activity: Not on file   Other Topics Concern  . Not on file   Social History Narrative   L[facility] in [facility]   Married with two sons and three daughters    Family History  Problem Relation Age of Onset  . Colon cancer Paternal Grandfather   . Heart attack Brother 50    Ventircular rupture  . Emphysema Brother   . Lung cancer Brother   . Heart disease Mother     Enlarged heart   Socially he tries to remain active. He does kayak. He also does work in his yard. He is widowed. He has 4  children one deceased. One grandchild. Is no tobacco or alcohol use.  ROS General: Negative; No fevers, chills, or night sweats;  HEENT: Negative; No changes in vision or hearing, sinus congestion, difficulty swallowing Pulmonary: Negative; No cough, wheezing, shortness of breath, hemoptysis Cardiovascular: Negative; No chest pain, presyncope, syncope, palpitations GI: Negative; No nausea, vomiting, diarrhea, or abdominal pain GU: Negative; No dysuria, hematuria, or difficulty voiding Musculoskeletal: Negative; no myalgias, joint pain, or weakness Hematologic/Oncology: Positive for remote history of iron deficiency anemia no easy bruising, bleeding Endocrine: Negative; no heat/cold intolerance; no diabetes Neuro: Negative; no changes in balance, headaches Skin: Brawny lower extremity skin changes from prior chronic edema;  No rashes or skin lesions Psychiatric: Negative;  No behavioral problems, depression Sleep: Negative; No snoring, daytime sleepiness, hypersomnolence, bruxism, restless legs, hypnogognic hallucinations, no cataplexy Other comprehensive 14 point system review is negative.   PE BP 132/60 mmHg  Pulse 56  Ht 5' 8.5" (1.74 m)  Wt 205 lb 12.8 oz (93.35 kg)  BMI 30.83 kg/m2  Repeat blood pressure by me 136/70 General: Alert, oriented, no distress.  Skin: normal turgor, no rashes HEENT: Normocephalic, atraumatic. Pupils round and reactive; sclera anicteric;no lid lag.  Nose without nasal septal hypertrophy Mouth/Parynx benign; Mallinpatti scale 3 Neck: No JVD, transmitted murmur to carotids Lungs: clear to ausculatation and percussion; no wheezing or rales Heart: RRR, s1 s2 normal 2/6 systolic murmur in the aortic region and a 1/6 diastolic murmur compatible with his aortic insufficiency. He does have mild bisferens pulse with murmurs transmitted to his carotids. Murmur increase with the squatting position. Abdomen: Moderate central adiposity; soft, nontender; no  hepatosplenomehaly, BS+; abdominal aorta nontender and not dilated by palpation. Pulses 2+ Extremities: Brawny stasis changes which are old without frank edema presently; no clubbing cyanosis, Homan's sign negative  Neurologic: grossly nonfocal Psychologic: normal affect and mood.  ECG (independently read by me): Sinus bradycardia at 56 bpm.  First-degree AV block.  Left bundle branch block.  October 2015 ECG (independently read by me): Sinus rhythm at 60 beats per minute with first degree AV block with a PR interval of 236 ms.  LDH with QRS widening.  QTC 514 ms.  Prior 06/15/2013 ECG (independently read by me and (sinus rhythm at 59 beats per minute.  First degree AV block with PR interval 220 ms.  Increased QTC of 500 ms.  Interventricular conduction delay.  Prior 10/ 22/2014 ECG: Sinus rhythm with first-degree AV block with PR interval 232 ms. QT interval 495 ms.  LABS:     BMP Latest Ref Rng 01/29/2014 09/29/2013 05/21/2013  Glucose 65 - 99 mg/dL 84 79 92  BUN 8 - 27 mg/dL 20 21 19   Creatinine 0.76 - 1.27 mg/dL 1.17 1.23 1.11  BUN/Creat Ratio 10 - 22 17 17 17   Sodium 134 - 144 mmol/L 141 140 139  Potassium 3.5 - 5.2 mmol/L 4.5 4.2 4.1  Chloride 97 - 108 mmol/L 97 97 96(L)  CO2 18 - 29 mmol/L 31(H) 30(H) 29  Calcium 8.6 - 10.2 mg/dL 8.9 9.1 9.0      Hepatic Function Latest Ref Rng 01/29/2014 09/29/2013 05/21/2013  Total Protein 6.0 - 8.5 g/dL 7.0 7.0 6.9  Albumin 3.5 - 5.2 g/dL - - -  AST 0 - 40 IU/L 26 25 35  ALT 0 - 44 IU/L 22 21 23   Alk Phosphatase 39 - 117 IU/L 58 61 59  Total Bilirubin 0.0 - 1.2 mg/dL 0.5 0.5 0.5  Bilirubin, Direct 0.00 - 0.40 mg/dL 0.15 0.15 0.19     CBC Latest Ref Rng 01/29/2014 09/29/2013 05/21/2013  WBC 3.4 - 10.8 x10E3/uL 5.9 5.8 6.1  Hemoglobin 12.6 - 17.7 g/dL 15.5 15.8 15.3  Hematocrit 37.5 - 51.0 % 47.2 46.8 47.4  Platelets 150 - 379 x10E3/uL 133(L) 137(L) -    Lab Results  Component Value Date   TSH 2.620 01/29/2014       Component  Value Date/Time   PROBNP 541.0* 05/05/2010 1158    Lipid Panel     Component Value Date/Time   CHOL 135 01/29/2014 0902   CHOL 126 08/20/2012 0930   TRIG 112 01/29/2014 0902   TRIG 94 08/20/2012 0930   HDL 44 01/29/2014  0902   HDL 41 08/20/2012 0930   LDLCALC 63 09/29/2013 0907   LDLCALC 66 08/20/2012 0930     RADIOLOGY: No results found.     ASSESSMENT AND PLAN: [name] is a 69 year old male who I have been following for over 20 years. He has a history of hypertrophic obstructive cardiomyopathy and his gradients have remained stable on subsequent echocardiographic evaluations.  He  is status post two-vessel coronary intervention and at last catheterization a patent stents in his LAD and proximal right coronary artery. He is maintaining sinus rhythm on Norpace 150 mg twice a day and has not had any breakthrough recurrent atrial fibrillation or atrial flutter. He also is on verapamil 120 g daily.Marland Kitchen His blood pressure today is controlled when taken by me was 138/70. His last nuclear perfusion study in April 2013 continued to show fairly normal perfusion.  Presently, he denies any anginal symptoms.  He denies any significant shortness of breath.  He is unaware of palpitations.  He is on Coumadin anticoagulation and his INR 2 weeks ago was therapeutic at 2.7.  His ECG is stable.  QTc interval is 492.  [name] will be repeating laboratory.  He is tolerating Crestor 10 mg daily and denies myalgias.  He will continue current therapy as prescribed.  I will see him in one year for reevaluation or sooner if problem arise.  Time spent: 25 minutes  Troy Sine, MD, [facility]  06/04/2014 12:04 PM

## 2014-06-08 ENCOUNTER — Encounter: Payer: Self-pay | Admitting: Family Medicine

## 2014-06-08 ENCOUNTER — Ambulatory Visit (INDEPENDENT_AMBULATORY_CARE_PROVIDER_SITE_OTHER): Payer: Medicare Other | Admitting: Family Medicine

## 2014-06-08 VITALS — BP 118/57 | HR 51 | Temp 97.1°F | Ht 68.5 in | Wt 206.0 lb

## 2014-06-08 DIAGNOSIS — I480 Paroxysmal atrial fibrillation: Secondary | ICD-10-CM | POA: Diagnosis not present

## 2014-06-08 DIAGNOSIS — I10 Essential (primary) hypertension: Secondary | ICD-10-CM | POA: Diagnosis not present

## 2014-06-08 DIAGNOSIS — D696 Thrombocytopenia, unspecified: Secondary | ICD-10-CM | POA: Diagnosis not present

## 2014-06-08 DIAGNOSIS — N400 Enlarged prostate without lower urinary tract symptoms: Secondary | ICD-10-CM

## 2014-06-08 DIAGNOSIS — E785 Hyperlipidemia, unspecified: Secondary | ICD-10-CM

## 2014-06-08 DIAGNOSIS — E559 Vitamin D deficiency, unspecified: Secondary | ICD-10-CM | POA: Diagnosis not present

## 2014-06-08 DIAGNOSIS — E538 Deficiency of other specified B group vitamins: Secondary | ICD-10-CM | POA: Diagnosis not present

## 2014-06-08 LAB — POCT CBC
Granulocyte percent: 72.5 %G (ref 37–80)
HCT, POC: 50.1 % (ref 43.5–53.7)
Hemoglobin: 15.6 g/dL (ref 14.1–18.1)
Lymph, poc: 1.5 (ref 0.6–3.4)
MCH, POC: 29.3 pg (ref 27–31.2)
MCHC: 31.3 g/dL — AB (ref 31.8–35.4)
MCV: 93.7 fL (ref 80–97)
MPV: 6.3 fL (ref 0–99.8)
POC Granulocyte: 4.3 (ref 2–6.9)
POC LYMPH PERCENT: 24.6 %L (ref 10–50)
Platelet Count, POC: 136 10*3/uL — AB (ref 142–424)
RBC: 5.34 M/uL (ref 4.69–6.13)
RDW, POC: 14.6 %
WBC: 5.9 10*3/uL (ref 4.6–10.2)

## 2014-06-08 MED ORDER — ROSUVASTATIN CALCIUM 10 MG PO TABS: 10.0000 mg | ORAL_TABLET | Freq: Every day | ORAL | Status: DC

## 2014-06-08 NOTE — Patient Instructions (Addendum)
Medicare Annual Wellness Visit  [facility] and the medical providers at [facility] strive to bring you the best medical care.  In doing so we not only want to address your current medical conditions and concerns but also to detect new conditions early and prevent illness, disease and health-related problems.    Medicare offers a yearly Wellness Visit which allows our clinical staff to assess your need for preventative services including immunizations, lifestyle education, counseling to decrease risk of preventable diseases and screening for fall risk and other medical concerns.    This visit is provided free of charge (no copay) for all Medicare recipients. The clinical pharmacists at [facility] have begun to conduct these Wellness Visits which will also include a thorough review of all your medications.    As you primary medical provider recommend that you make an appointment for your Annual Wellness Visit if you have not done so already this year.  You may set up this appointment before you leave today or you may call back ([phone]) and schedule an appointment.  Please make sure when you call that you mention that you are scheduling your Annual Wellness Visit with the clinical pharmacist so that the appointment may be made for the proper length of time.     Continue current medications. Continue good therapeutic lifestyle changes which include good diet and exercise. Fall precautions discussed with patient. If an FOBT was given today- please return it to our front desk. If you are over 42 years old - you may need Prevnar 31 or the adult Pneumonia vaccine.  Flu Shots are still available at our office. If you still haven't had one please call to set up a nurse visit to get one.   After your visit with [place] today you will receive a survey in the mail or online from [employer] regarding your care with [place]. Please take a moment to  fill this out. Your feedback is very important to [place] as you can help [place] better understand your patient needs as well as improve your experience and satisfaction. WE CARE ABOUT YOU!!!   The patient should continue to drink plenty of fluids every day as he is already doing. He should try holding the Flonase for one month and try one of the over-the-counter antihistamines like Allegra or Claritin to see if this helps his allergies. He should continue to use nasal saline frequently through the day He should also try Mucinex maximum strength, plain, blue and white in color, 1 twice daily for cough and congestion He should follow-up with the cardiologist as requested in one year and sooner if any problems. He should be careful to not put yourself at any risk for falling.

## 2014-06-08 NOTE — Progress Notes (Signed)
Subjective:    Patient ID: Joshua [name], male    DOB: 1945-08-24, 69 y.o.   MRN: [id number]  HPI Pt here for follow up and management of chronic medical problems which includes atrial fibrillation, hypertension, and hyperlipidemia. He is taking medications regularly. The patient recently saw his cardiologist, Dr. Claiborne Billings, and was given a good cardiac clearance at that time. He was scheduled to come back and see him again in one year. He is working on diminishing his Prilosec and has a plan for tapering the dose and coming off of the Prilosec. He's had problems with clearing his throat for over a year and indicates that he was started on Flonase at that time for allergic rhinitis. He denies any chest pain shortness of breath trouble swallowing and heartburn indigestion nausea vomiting or diarrhea. There is no history of any blood in the stool and he is voiding without problems. He is requesting a refill on his Crestor.          Patient Active Problem List   Diagnosis Date Noted  . Thrombocytopenia 01/30/2014  . Hyperlipidemia LDL goal <70 12/03/2013  . High risk medication use 05/21/2013  . BPH (benign prostatic hyperplasia) 01/29/2013  . History of hematuria 01/29/2013  . Family history of colon cancer 01/29/2013  . PAF (paroxysmal atrial fibrillation) 12/17/2012  . Aortic valve insufficiency 12/17/2012  . Mild mitral stenosis 12/17/2012  . Pulmonary infiltrates 07/17/2010  . COPD UNSPECIFIED 05/05/2010  . Hypertrophic obstructive cardiomyopathy 03/31/2010  . ATRIAL FLUTTER 03/31/2010  . HEMOCCULT POSITIVE STOOL 10/12/2008  . B12 DEFICIENCY 10/11/2008  . HYPERCHOLESTEROLEMIA 10/11/2008  . ANEMIA, IRON DEFICIENCY 10/11/2008  . Coronary atherosclerosis 10/11/2008  . FIBRILLATION, ATRIAL 10/11/2008  . GASTRITIS 10/11/2008  . HIATAL HERNIA WITH REFLUX 10/11/2008  . COLONIC POLYPS, ADENOMATOUS, HX OF 10/11/2008   Outpatient Encounter Prescriptions as of 06/08/2014  Medication Sig    . acetaminophen (TYLENOL) 325 MG tablet Take 650 mg by mouth as directed.    Marland Kitchen aspirin 81 MG tablet Take 81 mg by mouth daily.    . Cholecalciferol (VITAMIN D3) 2000 UNITS TABS Take 1 tablet by mouth daily.   . disopyramide (NORPACE) 150 MG capsule TAKE TWO CAPSULES BY MOUTH TWICE DAILY  . hydrochlorothiazide (HYDRODIURIL) 25 MG tablet Take 1 tablet (25 mg total) by mouth daily.  Marland Kitchen omeprazole (PRILOSEC) 20 MG capsule Take 1 capsule (20 mg total) by mouth daily.  . rosuvastatin (CRESTOR) 10 MG tablet Take 1 tablet (10 mg total) by mouth daily.  . verapamil (CALAN) 120 MG tablet Take 1 tablet (120 mg total) by mouth daily.  Marland Kitchen warfarin (COUMADIN) 5 MG tablet TAKE ONE & ONE-HALF TABLETS BY MOUTH ONCE DAILY AS DIRECTED  . [DISCONTINUED] CRESTOR 10 MG tablet TAKE ONE TABLET BY MOUTH ONCE DAILY    Review of Systems  Constitutional: Negative.   HENT: Negative.   Eyes: Negative.   Respiratory: Negative.        Hard to "clear throat"  Cardiovascular: Negative.   Gastrointestinal: Negative.   Endocrine: Negative.   Genitourinary: Negative.   Musculoskeletal: Negative.   Skin: Negative.   Allergic/Immunologic: Negative.   Neurological: Negative.   Hematological: Negative.   Psychiatric/Behavioral: Negative.        Objective:   Physical Exam  Constitutional: He is oriented to person, place, and time. He appears well-developed and well-nourished. No distress.  The patient is alert and in good spirits today.  HENT:  Head: Normocephalic and atraumatic.  Right Ear:  External ear normal.  Left Ear: External ear normal.  Mouth/Throat: Oropharynx is clear and moist. No oropharyngeal exudate.  He continues to have nasal congestion bilaterally with turbinate swelling  Eyes: Conjunctivae and EOM are normal. Pupils are equal, round, and reactive to light. Right eye exhibits no discharge. Left eye exhibits no discharge. No scleral icterus.  Neck: Normal range of motion. Neck supple. No thyromegaly  present.  Thyromegaly anterior cervical adenopathy or carotid bruits were audible  Cardiovascular: Normal rate, regular rhythm, normal heart sounds and intact distal pulses.   No murmur heard. The heart has a regular rhythm today at 60/m  Pulmonary/Chest: Effort normal. No respiratory distress. He has no wheezes. He has no rales. He exhibits no tenderness.  He continues to have chronic basilar congestion in the right lung base and this is a stable and normal for him.  Abdominal: Soft. Bowel sounds are normal. He exhibits no mass. There is no tenderness. There is no rebound and no guarding.  The abdomen is obese without tenderness masses or inguinal adenopathy  Musculoskeletal: Normal range of motion. He exhibits no edema or tenderness.  Lymphadenopathy:    He has no cervical adenopathy.  Neurological: He is alert and oriented to person, place, and time. He has normal reflexes. No cranial nerve deficit.  Skin: Skin is warm and dry. No rash noted. No erythema. No pallor.  Psychiatric: He has a normal mood and affect. His behavior is normal. Judgment and thought content normal.  Nursing note and vitals reviewed.  BP 118/57 mmHg  Pulse 51  Temp(Src) 97.1 F (36.2 C) (Oral)  Ht 5' 8.5" (1.74 m)  Wt 206 lb (93.441 kg)  BMI 30.86 kg/m2        Assessment & Plan:  1. BPH (benign prostatic hyperplasia) -The patient is having no voiding symptoms today and no problems with his prostate. - POCT CBC  2. Vitamin D deficiency -He will continue to take his current dose of vitamin D pending results of lab work - POCT CBC - Vit D  25 hydroxy (rtn osteoporosis monitoring)  3. Hyperlipemia -Cholesterol numbers have been good with recent checks and he will continue to take his current treatment pending results of lab work - POCT CBC - NMR, lipoprofile  4. Essential hypertension -The blood pressure remains good today and he will continue with current medication for his blood pressure and  heart. - POCT CBC - BMP8+EGFR - Hepatic function panel  5. B12 deficiency -This will be followed up today with lab work. - POCT CBC  6. PAF (paroxysmal atrial fibrillation) -His rhythm was regular today and he recently saw the cardiologist with a return visit scheduled for 1 year. - POCT CBC  7. Thrombocytopenia -He is having no problems with bleeding issues. And we will monitor this with the lab work today.  Meds ordered this encounter  Medications  . rosuvastatin (CRESTOR) 10 MG tablet    Sig: Take 1 tablet (10 mg total) by mouth daily.    Dispense:  30 tablet    Refill:  6  . Fluticasone Propionate (FLONASE NA)    Sig: Place into the nose.   Patient Instructions                       Medicare Annual Wellness Visit  [facility] and the medical providers at [facility] strive to bring you the best medical care.  In doing so we not only want to address  your current medical conditions and concerns but also to detect new conditions early and prevent illness, disease and health-related problems.    Medicare offers a yearly Wellness Visit which allows our clinical staff to assess your need for preventative services including immunizations, lifestyle education, counseling to decrease risk of preventable diseases and screening for fall risk and other medical concerns.    This visit is provided free of charge (no copay) for all Medicare recipients. The clinical pharmacists at [facility] have begun to conduct these Wellness Visits which will also include a thorough review of all your medications.    As you primary medical provider recommend that you make an appointment for your Annual Wellness Visit if you have not done so already this year.  You may set up this appointment before you leave today or you may call back ([phone]) and schedule an appointment.  Please make sure when you call that you mention that you are scheduling your Annual  Wellness Visit with the clinical pharmacist so that the appointment may be made for the proper length of time.     Continue current medications. Continue good therapeutic lifestyle changes which include good diet and exercise. Fall precautions discussed with patient. If an FOBT was given today- please return it to our front desk. If you are over 33 years old - you may need Prevnar 51 or the adult Pneumonia vaccine.  Flu Shots are still available at our office. If you still haven't had one please call to set up a nurse visit to get one.   After your visit with [place] today you will receive a survey in the mail or online from [employer] regarding your care with [place]. Please take a moment to fill this out. Your feedback is very important to [place] as you can help [place] better understand your patient needs as well as improve your experience and satisfaction. WE CARE ABOUT YOU!!!   The patient should continue to drink plenty of fluids every day as he is already doing. He should try holding the Flonase for one month and try one of the over-the-counter antihistamines like Allegra or Claritin to see if this helps his allergies. He should continue to use nasal saline frequently through the day He should also try Mucinex maximum strength, plain, blue and white in color, 1 twice daily for cough and congestion He should follow-up with the cardiologist as requested in one year and sooner if any problems. He should be careful to not put yourself at any risk for falling.     Arrie Senate MD

## 2014-06-09 ENCOUNTER — Other Ambulatory Visit: Payer: Medicare Other

## 2014-06-09 DIAGNOSIS — Z1212 Encounter for screening for malignant neoplasm of rectum: Secondary | ICD-10-CM

## 2014-06-09 LAB — HEPATIC FUNCTION PANEL
ALT: 19 IU/L (ref 0–44)
AST: 27 IU/L (ref 0–40)
Albumin: 3.8 g/dL (ref 3.6–4.8)
Alkaline Phosphatase: 53 IU/L (ref 39–117)
Bilirubin Total: 0.6 mg/dL (ref 0.0–1.2)
Bilirubin, Direct: 0.18 mg/dL (ref 0.00–0.40)
Total Protein: 6.7 g/dL (ref 6.0–8.5)

## 2014-06-09 LAB — NMR, LIPOPROFILE
Cholesterol: 140 mg/dL (ref 100–199)
HDL Cholesterol by NMR: 48 mg/dL (ref >39)
HDL Particle Number: 34.1 umol/L (ref >=30.5)
LDL Particle Number: 760 nmol/L (ref <1000)
LDL Size: 20.6 nm (ref >20.5)
LDL-C: 72 mg/dL (ref 0–99)
LP-IR Score: 50 — ABNORMAL HIGH (ref <=45)
Small LDL Particle Number: 374 nmol/L (ref <=527)
Triglycerides by NMR: 102 mg/dL (ref 0–149)

## 2014-06-09 LAB — BMP8+EGFR
BUN/Creatinine Ratio: 18 (ref 10–22)
BUN: 22 mg/dL (ref 8–27)
CO2: 30 mmol/L — ABNORMAL HIGH (ref 18–29)
Calcium: 8.7 mg/dL (ref 8.6–10.2)
Chloride: 96 mmol/L — ABNORMAL LOW (ref 97–108)
Creatinine, Ser: 1.21 mg/dL (ref 0.76–1.27)
GFR calc Af Amer: 70 mL/min/{1.73_m2} (ref >59)
GFR calc non Af Amer: 61 mL/min/{1.73_m2} (ref >59)
Glucose: 74 mg/dL (ref 65–99)
Potassium: 3.9 mmol/L (ref 3.5–5.2)
Sodium: 141 mmol/L (ref 134–144)

## 2014-06-09 LAB — VITAMIN B12: Vitamin B-12: 444 pg/mL (ref 211–946)

## 2014-06-09 LAB — VITAMIN D 25 HYDROXY (VIT D DEFICIENCY, FRACTURES): Vit D, 25-Hydroxy: 44.8 ng/mL (ref 30.0–100.0)

## 2014-06-10 NOTE — Progress Notes (Signed)
Lab only 

## 2014-06-11 LAB — FECAL OCCULT BLOOD, IMMUNOCHEMICAL: Fecal Occult Bld: POSITIVE — AB

## 2014-06-15 ENCOUNTER — Telehealth: Payer: Self-pay | Admitting: *Deleted

## 2014-06-15 NOTE — Progress Notes (Signed)
Patient aware. Will stop and pick up culture card and repeat labs in two weeks. Also says he is up to date on colonoscopy.

## 2014-06-15 NOTE — Telephone Encounter (Signed)
lmtcb regarding test results. 

## 2014-06-15 NOTE — Telephone Encounter (Signed)
-----   Message from Chipper Herb, MD sent at 06/12/2014  7:42 AM EDT ----- The FOBT is positive. Please wait 2 weeks and repeat the FOBT and get a CBC Also make sure that the patient is up-to-date on his colonoscopies

## 2014-06-19 ENCOUNTER — Other Ambulatory Visit: Payer: Self-pay | Admitting: Cardiovascular Disease

## 2014-06-21 NOTE — Telephone Encounter (Signed)
Rx has been sent to the pharmacy electronically. ° °

## 2014-06-29 ENCOUNTER — Ambulatory Visit (INDEPENDENT_AMBULATORY_CARE_PROVIDER_SITE_OTHER): Payer: Medicare Other | Admitting: Pharmacist Clinician (PhC)/ Clinical Pharmacy Specialist

## 2014-06-29 DIAGNOSIS — I351 Nonrheumatic aortic (valve) insufficiency: Secondary | ICD-10-CM | POA: Diagnosis not present

## 2014-06-29 DIAGNOSIS — I482 Chronic atrial fibrillation, unspecified: Secondary | ICD-10-CM

## 2014-06-29 DIAGNOSIS — I480 Paroxysmal atrial fibrillation: Secondary | ICD-10-CM

## 2014-06-29 LAB — POCT INR: INR: 2.9

## 2014-07-06 ENCOUNTER — Encounter: Payer: Self-pay | Admitting: Gastroenterology

## 2014-08-17 ENCOUNTER — Ambulatory Visit (INDEPENDENT_AMBULATORY_CARE_PROVIDER_SITE_OTHER): Payer: Medicare Other | Admitting: Pharmacist Clinician (PhC)/ Clinical Pharmacy Specialist

## 2014-08-17 ENCOUNTER — Other Ambulatory Visit: Payer: Self-pay | Admitting: Family Medicine

## 2014-08-17 DIAGNOSIS — I480 Paroxysmal atrial fibrillation: Secondary | ICD-10-CM

## 2014-08-17 DIAGNOSIS — I351 Nonrheumatic aortic (valve) insufficiency: Secondary | ICD-10-CM | POA: Diagnosis not present

## 2014-08-17 DIAGNOSIS — E538 Deficiency of other specified B group vitamins: Secondary | ICD-10-CM

## 2014-08-17 DIAGNOSIS — I482 Chronic atrial fibrillation, unspecified: Secondary | ICD-10-CM

## 2014-08-17 LAB — POCT INR: INR: 2.8

## 2014-08-17 MED ORDER — CYANOCOBALAMIN 1000 MCG/ML IJ SOLN
1000.0000 ug | INTRAMUSCULAR | Status: DC
  Administered 2014-08-17 – 2014-09-16 (×2): 1000 ug via INTRAMUSCULAR

## 2014-09-16 ENCOUNTER — Ambulatory Visit (INDEPENDENT_AMBULATORY_CARE_PROVIDER_SITE_OTHER): Payer: Medicare Other | Admitting: Physician Assistant

## 2014-09-16 ENCOUNTER — Encounter: Payer: Self-pay | Admitting: Physician Assistant

## 2014-09-16 VITALS — BP 154/71 | HR 62 | Temp 97.2°F | Ht 68.5 in | Wt 206.0 lb

## 2014-09-16 DIAGNOSIS — E538 Deficiency of other specified B group vitamins: Secondary | ICD-10-CM | POA: Diagnosis not present

## 2014-09-16 DIAGNOSIS — R399 Unspecified symptoms and signs involving the genitourinary system: Secondary | ICD-10-CM | POA: Diagnosis not present

## 2014-09-16 DIAGNOSIS — K5900 Constipation, unspecified: Secondary | ICD-10-CM

## 2014-09-16 DIAGNOSIS — N410 Acute prostatitis: Secondary | ICD-10-CM

## 2014-09-16 LAB — POCT URINALYSIS DIPSTICK
Bilirubin, UA: NEGATIVE
Glucose, UA: NEGATIVE
Ketones, UA: NEGATIVE
Leukocytes, UA: NEGATIVE
Nitrite, UA: NEGATIVE
Spec Grav, UA: >=1.03
Urobilinogen, UA: NEGATIVE
pH, UA: 5

## 2014-09-16 LAB — POCT UA - MICROSCOPIC ONLY
Bacteria, U Microscopic: NEGATIVE
Casts, Ur, LPF, POC: NEGATIVE
Crystals, Ur, HPF, POC: NEGATIVE
WBC, Ur, HPF, POC: NEGATIVE
Yeast, UA: NEGATIVE

## 2014-09-16 MED ORDER — CIPROFLOXACIN HCL 500 MG PO TABS: 500.0000 mg | ORAL_TABLET | Freq: Two times a day (BID) | ORAL | Status: DC

## 2014-09-16 MED ORDER — POLYETHYLENE GLYCOL 3350 17 GM/SCOOP PO POWD: 1.0000 | Freq: Once | ORAL | Status: DC

## 2014-09-16 NOTE — Progress Notes (Signed)
   Subjective:    Patient ID: Joshua [name], male    DOB: 06-Aug-1945, 69 y.o.   MRN: [id number]  HPI 69 y/o male presents with c/o dysuria x 3 days. He has had similar symptoms a year ago, which was related to his prostate. He has h/o BPH. He also has a h/o kidney stones.     Review of Systems  Constitutional: Negative for fever, chills and diaphoresis.  Gastrointestinal: Positive for constipation. Negative for nausea, vomiting and diarrhea.  Genitourinary: Positive for dysuria (burning ), frequency and hematuria (4 days ago).       Increased odor with urination   Musculoskeletal: Negative for back pain.       Objective:   Physical Exam  Constitutional: He is oriented to person, place, and time. He appears well-developed and well-nourished. No distress.  Pulmonary/Chest: Effort normal.  Abdominal: Soft. Bowel sounds are normal. He exhibits no distension and no mass. There is no tenderness. There is no rebound and no guarding.  Musculoskeletal: Normal range of motion.  Negative for CVA ttp   Neurological: He is alert and oriented to person, place, and time.  Skin: He is not diaphoretic.  Psychiatric: He has a normal mood and affect. His behavior is normal. Judgment and thought content normal.  Nursing note and vitals reviewed.  Results for orders placed or performed in visit on 09/16/14  POCT urinalysis dipstick  Result Value Ref Range   Color, UA yellow    Clarity, UA clear    Glucose, UA negative    Bilirubin, UA negative    Ketones, UA negative    Spec Grav, UA >=1.030    Blood, UA trace    pH, UA 5.0    Protein, UA 30+    Urobilinogen, UA negative    Nitrite, UA negative    Leukocytes, UA Negative Negative  POCT UA - Microscopic Only  Result Value Ref Range   WBC, Ur, HPF, POC negative    RBC, urine, microscopic occ    Bacteria, U Microscopic negative    Mucus, UA occ    Epithelial cells, urine per micros many    Crystals, Ur, HPF, POC negative    Casts, Ur, LPF,  POC negative    Yeast, UA negative           Assessment & Plan:  .1. UTI symptoms   - POCT urinalysis dipstick - POCT UA - Microscopic Only - ciprofloxacin (CIPRO) 500 MG tablet; Take 1 tablet (500 mg total) by mouth 2 (two) times daily.  Dispense: 28 tablet; Refill: 0 - chart reviewed, patient has taken this in the past with no problems, discussed following up on INR as directed.   2. Acute prostatitis  - ciprofloxacin (CIPRO) 500 MG tablet; Take 1 tablet (500 mg total) by mouth 2 (two) times daily.  Dispense: 28 tablet; Refill: 0  3. Constipation, unspecified constipation type  - polyethylene glycol powder (GLYCOLAX/MIRALAX) powder; Take 255 g by mouth once.  Dispense: 255 g; Refill: 0   Continue all meds Labs pending Health Maintenance reviewed Diet and exercise encouraged RTO 2 weeks   Makella Buckingham A. Benjamin Stain PA-C

## 2014-09-17 LAB — URINE CULTURE: Organism ID, Bacteria: NO GROWTH

## 2014-09-28 ENCOUNTER — Ambulatory Visit (INDEPENDENT_AMBULATORY_CARE_PROVIDER_SITE_OTHER): Payer: Medicare Other | Admitting: Pharmacist

## 2014-09-28 DIAGNOSIS — I351 Nonrheumatic aortic (valve) insufficiency: Secondary | ICD-10-CM | POA: Diagnosis not present

## 2014-09-28 DIAGNOSIS — I480 Paroxysmal atrial fibrillation: Secondary | ICD-10-CM | POA: Diagnosis not present

## 2014-09-28 DIAGNOSIS — I482 Chronic atrial fibrillation, unspecified: Secondary | ICD-10-CM

## 2014-09-28 LAB — POCT INR: INR: 3

## 2014-09-28 NOTE — Patient Instructions (Signed)
Anticoagulation Dose Instructions as of 09/28/2014      Joshua [name] Tue Wed Thu Fri Sat   New Dose 5 mg 5 mg 5 mg 5 mg 7.5 mg 5 mg 5 mg    Description        Decrease warfarin 5mg  to 1 and 1/2 tablets on Thursdays and 1 tablet all other days.     INR was 3.0 today

## 2014-10-20 ENCOUNTER — Ambulatory Visit (INDEPENDENT_AMBULATORY_CARE_PROVIDER_SITE_OTHER): Payer: Medicare Other | Admitting: Family Medicine

## 2014-10-20 DIAGNOSIS — R040 Epistaxis: Secondary | ICD-10-CM

## 2014-10-20 DIAGNOSIS — I480 Paroxysmal atrial fibrillation: Secondary | ICD-10-CM

## 2014-10-20 LAB — POCT INR: INR: 3.2

## 2014-10-20 MED ORDER — WARFARIN SODIUM 4 MG PO TABS: ORAL_TABLET | ORAL | Status: DC

## 2014-10-20 MED ORDER — MOMETASONE FUROATE 50 MCG/ACT NA SUSP: 2.0000 | Freq: Every day | NASAL | Status: DC

## 2014-10-20 NOTE — Progress Notes (Signed)
Subjective:  Patient ID: Joshua [name], male    DOB: [birth year]  Age: 69 y.o. MRN: [id number]  CC: nosebleeds   HPI RASHAN ROUNSAVILLE presents for multiple nosebleeds over the last several weeks. Patient reports that they generally cause some bloody drainage down the throat in addition to bleeding at the nares. They usually resolve after a few minutes spontaneously. He currently take aspirin and warfarin for his atrial fibrillation. He denies easy bruising. Patient denies any recent bouts of chest pain or palpitations. Additionally, patient is taking anticoagulants warfarin and aspirin. Patient denies any further recent excessive bleeding episodes including bleeding from the gums, genitalia, rectal bleeding or hematuria. History Eulalio has a past medical history of Atrial fibrillation; Coronary artery disease; Hypercholesteremia; Hypercholesteremia; Iron deficiency anemia, unspecified; Other B-complex deficiencies; adenomatous colonic polyps (04-14-2003); Gastritis (06-10-2006); Hiatal hernia (06-10-2006); Hard of hearing; Rheumatic fever; COPD (chronic obstructive pulmonary disease); Hypertension; Hypertrophic obstructive cardiomyopathy; and LVH (left ventricular hypertrophy).   He has past surgical history that includes Coronary angioplasty; Shoulder surgery; Cardioversion (May 2012); Cardiac catheterization (05/28/2005); Cardiac catheterization (08/11/2001); Cardiac catheterization (01/15/2001); Cardiovascular stress test (05/29/2011); and transthoracic echocardiogram (05/29/2011).   His family history includes Colon cancer in his paternal grandfather; Emphysema in his brother; Heart attack (age of onset: 37) in his brother; Heart disease in his mother; Lung cancer in his brother.He reports that he quit smoking about 29 years ago. His smoking use included Cigarettes. He has a 10 pack-year smoking history. He has never used smokeless tobacco. He reports that he does not drink alcohol or use illicit  drugs.  Outpatient Prescriptions Prior to Visit  Medication Sig Dispense Refill  . acetaminophen (TYLENOL) 325 MG tablet Take 650 mg by mouth as directed.      . Cholecalciferol (VITAMIN D3) 2000 UNITS TABS Take 1 tablet by mouth daily.     . disopyramide (NORPACE) 150 MG capsule TAKE TWO CAPSULES BY MOUTH TWICE DAILY 120 capsule 6  . hydrochlorothiazide (HYDRODIURIL) 25 MG tablet Take 1 tablet (25 mg total) by mouth daily. 90 tablet 1  . omeprazole (PRILOSEC) 20 MG capsule Take 1 capsule (20 mg total) by mouth daily. (Patient taking differently: Take 20 mg by mouth every other day. ) 30 capsule 11  . polyethylene glycol powder (GLYCOLAX/MIRALAX) powder Take 255 g by mouth once. 255 g 0  . rosuvastatin (CRESTOR) 10 MG tablet Take 1 tablet (10 mg total) by mouth daily. 30 tablet 6  . verapamil (CALAN) 120 MG tablet Take 1 tablet (120 mg total) by mouth daily. 90 tablet 1  . aspirin 81 MG tablet Take 81 mg by mouth daily.      Marland Kitchen warfarin (COUMADIN) 5 MG tablet TAKE ONE & ONE-HALF TABLETS BY MOUTH ONCE DAILY AS DIRECTED 135 tablet 1  . ciprofloxacin (CIPRO) 500 MG tablet Take 1 tablet (500 mg total) by mouth 2 (two) times daily. (Patient not taking: Reported on 10/20/2014) 28 tablet 0   Facility-Administered Medications Prior to Visit  Medication Dose Route Frequency Provider Last Rate Last Dose  . cyanocobalamin ((VITAMIN B-12)) injection 1,000 mcg  1,000 mcg Intramuscular Q30 days Chipper Herb, MD   1,000 mcg at 09/16/14 1145    ROS Review of Systems  Constitutional: Negative for fever, chills and diaphoresis.  HENT: Negative for congestion, rhinorrhea and sore throat.   Respiratory: Negative for cough, shortness of breath and wheezing.   Cardiovascular: Negative for chest pain.  Gastrointestinal: Negative for nausea, vomiting, abdominal pain, diarrhea, constipation and  abdominal distention.  Genitourinary: Negative for dysuria and frequency.  Musculoskeletal: Negative for joint swelling  and arthralgias.  Skin: Negative for rash.  Neurological: Negative for headaches.  Hematological: Negative for adenopathy.    Objective:  BP 143/63 mmHg  Pulse 72  Temp(Src) 97 F (36.1 C) (Oral)  Ht 5' 8.5" (1.74 m)  Wt 204 lb 6.4 oz (92.715 kg)  BMI 30.62 kg/m2  BP Readings from Last 3 Encounters:  10/20/14 143/63  09/16/14 154/71  06/08/14 118/57    Wt Readings from Last 3 Encounters:  10/20/14 204 lb 6.4 oz (92.715 kg)  09/16/14 206 lb (93.441 kg)  06/08/14 206 lb (93.441 kg)     Physical Exam  Constitutional: He appears well-developed and well-nourished.  HENT:  Head: Normocephalic and atraumatic.  Right Ear: Tympanic membrane and external ear normal. No decreased hearing is noted.  Left Ear: Tympanic membrane and external ear normal. No decreased hearing is noted.  Nose: Mucosal edema present. No sinus tenderness, nasal deformity or septal deviation. Epistaxis (minimal fresh blood found in the right nare) is observed. Right sinus exhibits no maxillary sinus tenderness and no frontal sinus tenderness. Left sinus exhibits no maxillary sinus tenderness and no frontal sinus tenderness.  Mouth/Throat: No oropharyngeal exudate or posterior oropharyngeal erythema.  Eyes: Pupils are equal, round, and reactive to light.  Neck: Normal range of motion. Neck supple.  Cardiovascular: Normal rate and regular rhythm.   No murmur heard. Pulmonary/Chest: Breath sounds normal. No respiratory distress.  Abdominal: Soft. Bowel sounds are normal. He exhibits no mass. There is no tenderness.  Vitals reviewed.   No results found for: HGBA1C  Lab Results  Component Value Date   WBC 5.9 06/08/2014   HGB 15.6 06/08/2014   HCT 50.1 06/08/2014   PLT 133* 01/29/2014   GLUCOSE 74 06/08/2014   CHOL 140 06/08/2014   TRIG 102 06/08/2014   HDL 48 06/08/2014   LDLCALC 63 09/29/2013   ALT 19 06/08/2014   AST 27 06/08/2014   NA 141 06/08/2014   K 3.9 06/08/2014   CL 96* 06/08/2014    CREATININE 1.21 06/08/2014   BUN 22 06/08/2014   CO2 30* 06/08/2014   TSH 2.620 01/29/2014   PSA 0.3 01/29/2014   INR 3.2 10/20/2014    No results found.  Assessment & Plan:   Jaedon was seen today for nosebleeds.  Diagnoses and all orders for this visit:  PAF (paroxysmal atrial fibrillation) -     POCT INR -     POCT INR  Epistaxis  Other orders -     warfarin (COUMADIN) 4 MG tablet; One  daily except  1&1/2 on Tues & Friday -     mometasone (NASONEX) 50 MCG/ACT nasal spray; Place 2 sprays into the nose daily.   I have discontinued Mr. Eberle's aspirin and ciprofloxacin. I have also changed his warfarin. Additionally, I am having him start on mometasone. Lastly, I am having him maintain his Vitamin D3, acetaminophen, omeprazole, hydrochlorothiazide, verapamil, rosuvastatin, disopyramide, and polyethylene glycol powder. We will continue to administer cyanocobalamin.  Meds ordered this encounter  Medications  . warfarin (COUMADIN) 4 MG tablet    Sig: One  daily except  1&1/2 on Tues & Friday    Dispense:  35 tablet    Refill:  2  . mometasone (NASONEX) 50 MCG/ACT nasal spray    Sig: Place 2 sprays into the nose daily.    Dispense:  17 g    Refill:  12  Discontinue aspirin. Patient expresses concern about cardiology's recommendation to take that medicine. I suggested he check with his cardiologist and let him know about the nosebleeds and see if he didn't concur.  Follow-up: Return in about 1 month (around 11/20/2014), or if symptoms worsen or fail to improve.  Claretta Fraise, M.D.

## 2014-10-26 ENCOUNTER — Encounter: Payer: Self-pay | Admitting: Family Medicine

## 2014-10-26 ENCOUNTER — Ambulatory Visit (INDEPENDENT_AMBULATORY_CARE_PROVIDER_SITE_OTHER): Payer: Medicare Other | Admitting: Family Medicine

## 2014-10-26 VITALS — BP 139/66 | HR 55 | Temp 97.1°F | Ht 68.5 in | Wt 200.0 lb

## 2014-10-26 DIAGNOSIS — I351 Nonrheumatic aortic (valve) insufficiency: Secondary | ICD-10-CM | POA: Diagnosis not present

## 2014-10-26 DIAGNOSIS — I10 Essential (primary) hypertension: Secondary | ICD-10-CM

## 2014-10-26 DIAGNOSIS — E785 Hyperlipidemia, unspecified: Secondary | ICD-10-CM | POA: Diagnosis not present

## 2014-10-26 DIAGNOSIS — I482 Chronic atrial fibrillation, unspecified: Secondary | ICD-10-CM

## 2014-10-26 DIAGNOSIS — D696 Thrombocytopenia, unspecified: Secondary | ICD-10-CM

## 2014-10-26 DIAGNOSIS — N5201 Erectile dysfunction due to arterial insufficiency: Secondary | ICD-10-CM | POA: Diagnosis not present

## 2014-10-26 DIAGNOSIS — E559 Vitamin D deficiency, unspecified: Secondary | ICD-10-CM

## 2014-10-26 DIAGNOSIS — I480 Paroxysmal atrial fibrillation: Secondary | ICD-10-CM

## 2014-10-26 DIAGNOSIS — K5901 Slow transit constipation: Secondary | ICD-10-CM | POA: Diagnosis not present

## 2014-10-26 DIAGNOSIS — E538 Deficiency of other specified B group vitamins: Secondary | ICD-10-CM | POA: Diagnosis not present

## 2014-10-26 LAB — POCT INR: INR: 2.2

## 2014-10-26 MED ORDER — ROSUVASTATIN CALCIUM 10 MG PO TABS: 10.0000 mg | ORAL_TABLET | Freq: Every day | ORAL | Status: DC

## 2014-10-26 MED ORDER — DISOPYRAMIDE PHOSPHATE 150 MG PO CAPS: 300.0000 mg | ORAL_CAPSULE | Freq: Two times a day (BID) | ORAL | Status: DC

## 2014-10-26 MED ORDER — VERAPAMIL HCL 120 MG PO TABS
120.0000 mg | ORAL_TABLET | Freq: Every day | ORAL | Status: DC
Start: 2014-10-26 — End: 2015-03-29

## 2014-10-26 MED ORDER — OMEPRAZOLE 10 MG PO CPDR: 10.0000 mg | DELAYED_RELEASE_CAPSULE | Freq: Every day | ORAL | Status: DC

## 2014-10-26 MED ORDER — HYDROCHLOROTHIAZIDE 25 MG PO TABS: 25.0000 mg | ORAL_TABLET | Freq: Every day | ORAL | Status: DC

## 2014-10-26 MED ORDER — MOMETASONE FUROATE 50 MCG/ACT NA SUSP: 2.0000 | Freq: Every day | NASAL | Status: DC

## 2014-10-26 MED ORDER — WARFARIN SODIUM 4 MG PO TABS: ORAL_TABLET | ORAL | Status: DC

## 2014-10-26 MED ORDER — OMEPRAZOLE 20 MG PO CPDR
20.0000 mg | DELAYED_RELEASE_CAPSULE | Freq: Every day | ORAL | Status: DC
Start: 2014-10-26 — End: 2014-10-26

## 2014-10-26 NOTE — Patient Instructions (Addendum)
Medicare Annual Wellness Visit  [facility] and the medical providers at [facility] strive to bring you the best medical care.  In doing so we not only want to address your current medical conditions and concerns but also to detect new conditions early and prevent illness, disease and health-related problems.    Medicare offers a yearly Wellness Visit which allows our clinical staff to assess your need for preventative services including immunizations, lifestyle education, counseling to decrease risk of preventable diseases and screening for fall risk and other medical concerns.    This visit is provided free of charge (no copay) for all Medicare recipients. The clinical pharmacists at [facility] have begun to conduct these Wellness Visits which will also include a thorough review of all your medications.    As you primary medical provider recommend that you make an appointment for your Annual Wellness Visit if you have not done so already this year.  You may set up this appointment before you leave today or you may call back ([phone]) and schedule an appointment.  Please make sure when you call that you mention that you are scheduling your Annual Wellness Visit with the clinical pharmacist so that the appointment may be made for the proper length of time.     Continue current medications. Continue good therapeutic lifestyle changes which include good diet and exercise. Fall precautions discussed with patient. If an FOBT was given today- please return it to our front desk. If you are over 69 years old - you may need Prevnar 55 or the adult Pneumonia vaccine.  **Flu shots will be available soon--- please call and schedule a FLU-CLINIC appointment**  After your visit with [place] today you will receive a survey in the mail or online from [employer] regarding your care with [place]. Please take a moment to fill this out. Your feedback is  very important to [place] as you can help [place] better understand your patient needs as well as improve your experience and satisfaction. WE CARE ABOUT YOU!!!   **Please join [place] SEPT.[email or web address], 2016 from 5:00 to 7:00pm for our OPEN HOUSE! Come out and meet our NEW providers**  The patient should continue with his current dose of Coumadin as his pro time is 2.2 today. He should have a ProTime repeated in a couple weeks. He is currently taking 4 mg daily except 6 mg on Tuesday and Friday. The patient should make sure that he arranges to have his eyes checked When he returns to have his pro time he will need an ear irrigation because of ears cerumen He can use Debrox eardrops 2-3 drops in each ear canal for 3 nights in a row wait 1 week and repeat this and then when he comes for his next pro time the nurse can irrigate both ear canals to remove the ears cerumen He should get his flu shot by the end of October He should follow-up with the cardiologist as planned next winter He should continue with the MiraLAX for his constipation and discussed the use of this with the clinical pharmacists when she reviews his next pro time     Anticoagulation Dose Instructions as of 10/26/2014      Joshua [name] Tue Wed Thu Fri Sat   New Dose 4 mg 4 mg 6 mg 4 mg 4 mg 6 mg 4 mg    Description        Cont same dose

## 2014-10-26 NOTE — Progress Notes (Addendum)
 Subjective:    Patient ID: Joshua [name], male    DOB: 04/05/1945, 69 y.o.   MRN: [id number]  HPI Pt here for follow up and management of chronic medical problems which includes hypertension and hyperlipidemia. He is taking medications regularly. the patient recently had a nosebleed and this has resolved. His pro time was thin. He is currently taking 4 mg daily except 6 mg on Tuesday and Friday and we will not change this. He will be given an appointment to follow-up with this and a couple weeks with the clinical pharmacists. He also complains today of erectile dysfunction and would like to restart the alprostadil injections that he has used in the past. He will also discuss this with the clinical pharmacist. He denies chest pain shortness of breath unless he is his is extremely exerting himself trouble swallowing and heartburn indigestion nausea and vomiting diarrhea or blood in the stool. He is passing his water well. He is very active.        Patient Active Problem List   Diagnosis Date Noted  . Thrombocytopenia 01/30/2014  . Hyperlipidemia LDL goal <70 12/03/2013  . High risk medication use 05/21/2013  . BPH (benign prostatic hyperplasia) 01/29/2013  . History of hematuria 01/29/2013  . Family history of colon cancer 01/29/2013  . PAF (paroxysmal atrial fibrillation) 12/17/2012  . Aortic valve insufficiency 12/17/2012  . Mild mitral stenosis 12/17/2012  . Pulmonary infiltrates 07/17/2010  . COPD UNSPECIFIED 05/05/2010  . Hypertrophic obstructive cardiomyopathy 03/31/2010  . ATRIAL FLUTTER 03/31/2010  . HEMOCCULT POSITIVE STOOL 10/12/2008  . B12 deficiency 10/11/2008  . HYPERCHOLESTEROLEMIA 10/11/2008  . ANEMIA, IRON DEFICIENCY 10/11/2008  . Coronary atherosclerosis 10/11/2008  . FIBRILLATION, ATRIAL 10/11/2008  . GASTRITIS 10/11/2008  . HIATAL HERNIA WITH REFLUX 10/11/2008  . COLONIC POLYPS, ADENOMATOUS, HX OF 10/11/2008   Outpatient Encounter Prescriptions as of 10/26/2014    Medication Sig  . acetaminophen (TYLENOL) 325 MG tablet Take 650 mg by mouth as directed.    . Cholecalciferol (VITAMIN D3) 2000 UNITS TABS Take 1 tablet by mouth daily.   . disopyramide (NORPACE) 150 MG capsule TAKE TWO CAPSULES BY MOUTH TWICE DAILY  . hydrochlorothiazide (HYDRODIURIL) 25 MG tablet Take 1 tablet (25 mg total) by mouth daily.  . mometasone (NASONEX) 50 MCG/ACT nasal spray Place 2 sprays into the nose daily.  . omeprazole (PRILOSEC) 20 MG capsule Take 1 capsule (20 mg total) by mouth daily. (Patient taking differently: Take 20 mg by mouth every other day. )  . polyethylene glycol powder (GLYCOLAX/MIRALAX) powder Take 255 g by mouth once.  . rosuvastatin (CRESTOR) 10 MG tablet Take 1 tablet (10 mg total) by mouth daily.  . verapamil (CALAN) 120 MG tablet Take 1 tablet (120 mg total) by mouth daily.  . warfarin (COUMADIN) 4 MG tablet One  daily except  1&1/2 on Tues & Friday   Facility-Administered Encounter Medications as of 10/26/2014  Medication  . cyanocobalamin ((VITAMIN B-12)) injection 1,000 mcg     Review of Systems  Constitutional: Negative.   HENT: Negative.   Eyes: Negative.   Respiratory: Negative.   Cardiovascular: Negative.   Gastrointestinal: Negative.   Endocrine: Negative.   Genitourinary: Negative.   Musculoskeletal: Negative.   Skin: Negative.   Allergic/Immunologic: Negative.   Neurological: Negative.   Hematological: Negative.   Psychiatric/Behavioral: Negative.        Objective:   Physical Exam  Constitutional: He is oriented to person, place, and time. He appears well-developed and well-nourished. No   distress.  HENT:  Head: Normocephalic and atraumatic.  Nose: Nose normal.  Mouth/Throat: Oropharynx is clear and moist. No oropharyngeal exudate.  Ears cerumen bilaterally  Eyes: Conjunctivae and EOM are normal. Pupils are equal, round, and reactive to light. Right eye exhibits no discharge. Left eye exhibits no discharge. No scleral  icterus.  Neck: Normal range of motion. Neck supple. No thyromegaly present.  No anterior cervical adenopathy or thyromegaly  Cardiovascular: Normal rate, regular rhythm and intact distal pulses.  Exam reveals no gallop and no friction rub.   Murmur heard. The heart is regular at 60/m with a grade 2/6 systolic ejection murmur  Pulmonary/Chest: Effort normal. No respiratory distress. He has no wheezes. He has no rales. He exhibits no tenderness.  There is some atelectasis in both lung bases.  Abdominal: Soft. Bowel sounds are normal. He exhibits no mass. There is no tenderness. There is no rebound and no guarding.  Nontender without masses or organ enlargement  Musculoskeletal: Normal range of motion. He exhibits no edema or tenderness.  There is no edema in either leg and he has varicosities in both or extremities but good pulses  Lymphadenopathy:    He has no cervical adenopathy.  Neurological: He is alert and oriented to person, place, and time. He has normal reflexes. No cranial nerve deficit.  Skin: Skin is warm and dry. No rash noted.  Psychiatric: He has a normal mood and affect. His behavior is normal. Judgment and thought content normal.  Nursing note and vitals reviewed.  BP 139/66 mmHg  Pulse 55  Temp(Src) 97.1 F (36.2 C) (Oral)  Ht 5' 8.5" (1.74 m)  Wt 200 lb (90.719 kg)  BMI 29.96 kg/m2        Assessment & Plan:  1. PAF (paroxysmal atrial fibrillation) -The patient is in normal sinus rhythm today at 60/m and will follow-up with cardiology in 2017 on a yearly basis - POCT INR - CBC with Differential/Platelet  2. B12 deficiency -Energy wise the patient is feeling well and has no peripheral neuropathy - Vitamin B12 - CBC with Differential/Platelet  3. Vitamin D deficiency -Continue current treatment pending results of lab work - Vit D  25 hydroxy (rtn osteoporosis monitoring) - CBC with Differential/Platelet  4. Hyperlipemia -Continue current treatment  pending results of lab work - NMR, lipoprofile - CBC with Differential/Platelet  5. Essential hypertension -The blood pressure is good today and there will be no changes in his medication. He is getting similar readings at home that we have done here. In the 130s over the 60s. - BMP8+EGFR - Hepatic function panel - CBC with Differential/Platelet  6. Thrombocytopenia -The patient has had some increased bruising recently and this may be secondary to his abnormal pro time which is within normal limits today. - CBC with Differential/Platelet  7. Aortic valve insufficiency -Continue to follow-up with cardiology - POCT INR - CBC with Differential/Platelet  8. Slow transit constipation -Constipation has been corrected with MiraLAX he will continue to use this.  9. Erectile dysfunction due to arterial insufficiency -He will discuss with the clinical pharmacist across the daily injections for erectile dysfunction  10. Chronic atrial fibrillation -Follow up with clinical pharmacy as planned for pro times and with cardiology  Meds ordered this encounter  Medications  . warfarin (COUMADIN) 4 MG tablet    Sig: One  daily except  1&1/2 on Tues & Friday    Dispense:  115 tablet    Refill:  3  . verapamil (CALAN)  120 MG tablet    Sig: Take 1 tablet (120 mg total) by mouth daily.    Dispense:  90 tablet    Refill:  3  . rosuvastatin (CRESTOR) 10 MG tablet    Sig: Take 1 tablet (10 mg total) by mouth daily.    Dispense:  90 tablet    Refill:  3  . omeprazole (PRILOSEC) 20 MG capsule    Sig: Take 1 capsule (20 mg total) by mouth daily.    Dispense:  90 capsule    Refill:  3  . mometasone (NASONEX) 50 MCG/ACT nasal spray    Sig: Place 2 sprays into the nose daily.    Dispense:  51 g    Refill:  3  . hydrochlorothiazide (HYDRODIURIL) 25 MG tablet    Sig: Take 1 tablet (25 mg total) by mouth daily.    Dispense:  90 tablet    Refill:  3  . disopyramide (NORPACE) 150 MG capsule    Sig:  Take 2 capsules (300 mg total) by mouth 2 (two) times daily.    Dispense:  360 capsule    Refill:  3   Anticoagulation Dose Instructions as of 10/26/2014      [name] Tue Wed Thu Fri Sat   New Dose 4 mg 4 mg 6 mg 4 mg 4 mg 6 mg 4 mg    Description        Cont same dose       Arrie Senate MD

## 2014-10-26 NOTE — Addendum Note (Signed)
Addended by: Zannie Cove on: 10/26/2014 09:32 AM   Modules accepted: Orders

## 2014-10-27 LAB — CBC WITH DIFFERENTIAL/PLATELET
Basophils Absolute: 0 10*3/uL (ref 0.0–0.2)
Basos: 1 %
EOS (ABSOLUTE): 0.2 10*3/uL (ref 0.0–0.4)
Eos: 3 %
Hematocrit: 47.1 % (ref 37.5–51.0)
Hemoglobin: 15.5 g/dL (ref 12.6–17.7)
Immature Grans (Abs): 0 10*3/uL (ref 0.0–0.1)
Immature Granulocytes: 0 %
Lymphocytes Absolute: 1.5 10*3/uL (ref 0.7–3.1)
Lymphs: 25 %
MCH: 31.3 pg (ref 26.6–33.0)
MCHC: 32.9 g/dL (ref 31.5–35.7)
MCV: 95 fL (ref 79–97)
Monocytes Absolute: 0.5 10*3/uL (ref 0.1–0.9)
Monocytes: 7 %
Neutrophils Absolute: 3.9 10*3/uL (ref 1.4–7.0)
Neutrophils: 64 %
Platelets: 137 10*3/uL — ABNORMAL LOW (ref 150–379)
RBC: 4.96 x10E6/uL (ref 4.14–5.80)
RDW: 13.8 % (ref 12.3–15.4)
WBC: 6.1 10*3/uL (ref 3.4–10.8)

## 2014-10-27 LAB — NMR, LIPOPROFILE
Cholesterol: 128 mg/dL (ref 100–199)
HDL Cholesterol by NMR: 45 mg/dL (ref >39)
HDL Particle Number: 32.6 umol/L (ref >=30.5)
LDL Particle Number: 584 nmol/L (ref <1000)
LDL Size: 20.6 nm (ref >20.5)
LDL-C: 65 mg/dL (ref 0–99)
LP-IR Score: 44 (ref <=45)
Small LDL Particle Number: 314 nmol/L (ref <=527)
Triglycerides by NMR: 91 mg/dL (ref 0–149)

## 2014-10-27 LAB — BMP8+EGFR
BUN/Creatinine Ratio: 17 (ref 10–22)
BUN: 20 mg/dL (ref 8–27)
CO2: 30 mmol/L — ABNORMAL HIGH (ref 18–29)
Calcium: 8.7 mg/dL (ref 8.6–10.2)
Chloride: 97 mmol/L (ref 97–108)
Creatinine, Ser: 1.21 mg/dL (ref 0.76–1.27)
GFR calc Af Amer: 70 mL/min/{1.73_m2} (ref >59)
GFR calc non Af Amer: 60 mL/min/{1.73_m2} (ref >59)
Glucose: 73 mg/dL (ref 65–99)
Potassium: 4.2 mmol/L (ref 3.5–5.2)
Sodium: 140 mmol/L (ref 134–144)

## 2014-10-27 LAB — HEPATIC FUNCTION PANEL
ALT: 16 IU/L (ref 0–44)
AST: 23 IU/L (ref 0–40)
Albumin: 3.8 g/dL (ref 3.5–4.8)
Alkaline Phosphatase: 54 IU/L (ref 39–117)
Bilirubin Total: 0.7 mg/dL (ref 0.0–1.2)
Bilirubin, Direct: 0.23 mg/dL (ref 0.00–0.40)
Total Protein: 7 g/dL (ref 6.0–8.5)

## 2014-10-27 LAB — VITAMIN B12: Vitamin B-12: 367 pg/mL (ref 211–946)

## 2014-10-27 LAB — VITAMIN D 25 HYDROXY (VIT D DEFICIENCY, FRACTURES): Vit D, 25-Hydroxy: 41.2 ng/mL (ref 30.0–100.0)

## 2014-11-09 ENCOUNTER — Encounter: Payer: Medicare Other | Admitting: Pharmacist Clinician (PhC)/ Clinical Pharmacy Specialist

## 2014-11-10 ENCOUNTER — Ambulatory Visit (INDEPENDENT_AMBULATORY_CARE_PROVIDER_SITE_OTHER): Payer: Medicare Other | Admitting: Pharmacist

## 2014-11-10 DIAGNOSIS — I480 Paroxysmal atrial fibrillation: Secondary | ICD-10-CM | POA: Diagnosis not present

## 2014-11-10 DIAGNOSIS — N529 Male erectile dysfunction, unspecified: Secondary | ICD-10-CM

## 2014-11-10 DIAGNOSIS — I482 Chronic atrial fibrillation, unspecified: Secondary | ICD-10-CM

## 2014-11-10 DIAGNOSIS — I351 Nonrheumatic aortic (valve) insufficiency: Secondary | ICD-10-CM | POA: Diagnosis not present

## 2014-11-10 LAB — POCT INR: INR: 1.8

## 2014-11-10 MED ORDER — PHENTOLAMINE-ALPROSTADIL 0.5-20 MG-MCG/ML IC SOLN: 0.1000 mL | INTRACAVERNOUS | Status: DC | PRN

## 2014-11-10 NOTE — Patient Instructions (Signed)
Anticoagulation Dose Instructions as of 11/10/2014      [name] Tue Wed Thu Fri Sat   New Dose 4 mg 6 mg 4 mg 6 mg 4 mg 6 mg 4 mg    Description        Change dose to 1 and 1/2 tablet on mondays, wednesdays and fridays.  Take 1 tablet all other days.     INR was 1.8 today

## 2014-11-10 NOTE — Progress Notes (Signed)
Subjective:     Indication: atrial fibrillation Bleeding signs/symptoms: Mild - some bruising on left forearm Thromboembolic signs/symptoms: None  Missed Coumadin doses: None Medication changes: yes - amoxicillin for last 4 days Dietary changes: no Bacterial/viral infection: yes - dental infection Other concerns: yes - Dr Laurance Flatten has referred patient for ED.  Joshua [name] has taken a Trimix of 52mcg of postaglandin + 15mg  papaverine + 0.5mg  of phentolamine per mL in past - dose was 0.1 to 0.3 ml per injection prior to sexual intercourse.  He reports that it worked well in past without any side effects.  The following portions of the patient's history were reviewed and updated as appropriate: allergies, current medications, past family history, past medical history, past social history, past surgical history and problem list.   Objective:    INR Today: 1.8 Current dose: warfarin 4mg  take 1 tablet daily except 6mg  on Tuesdays and fridays.     Assessment:    Subtherapeutic INR for goal of 2-3   Erectile dysfunction  Plan:    1. New dose: change wafrain to 4mg  - take 1 and 1/2 tablet or 6mg  mondays, wednesdays and fridays.  Take 1 tablet all other days.   2. Next INR: 2 weeks   3.  Rx sent in for trimix at dose he took before. Reviewed proper injection and side effects to monitor for.

## 2014-11-18 ENCOUNTER — Other Ambulatory Visit: Payer: Self-pay | Admitting: *Deleted

## 2014-11-18 MED ORDER — PHENTOLAMINE-ALPROSTADIL 0.5-20 MG-MCG/ML IC SOLN: 0.1000 mL | INTRACAVERNOUS | Status: DC | PRN

## 2014-11-18 NOTE — Telephone Encounter (Signed)
Re- printed - to be faxed

## 2014-11-24 ENCOUNTER — Encounter: Payer: Self-pay | Admitting: Pharmacist

## 2014-11-24 ENCOUNTER — Ambulatory Visit (INDEPENDENT_AMBULATORY_CARE_PROVIDER_SITE_OTHER): Payer: Medicare Other | Admitting: Pharmacist

## 2014-11-24 VITALS — BP 130/70 | HR 60 | Ht 68.0 in | Wt 200.0 lb

## 2014-11-24 DIAGNOSIS — I351 Nonrheumatic aortic (valve) insufficiency: Secondary | ICD-10-CM

## 2014-11-24 DIAGNOSIS — I482 Chronic atrial fibrillation, unspecified: Secondary | ICD-10-CM

## 2014-11-24 DIAGNOSIS — Z0000 Encounter for general adult medical examination without abnormal findings: Secondary | ICD-10-CM | POA: Diagnosis not present

## 2014-11-24 DIAGNOSIS — I480 Paroxysmal atrial fibrillation: Secondary | ICD-10-CM

## 2014-11-24 LAB — POCT INR: INR: 2.6

## 2014-11-24 MED ORDER — CYANOCOBALAMIN 1000 MCG PO TABS: 1000.0000 ug | ORAL_TABLET | Freq: Every day | ORAL | Status: AC

## 2014-11-24 NOTE — Progress Notes (Signed)
Patient ID: Joshua [name], male   DOB: 07-16-1945, 69 y.o.   MRN: [id number]    Subjective:   Joshua [name] is a 69 y.o. male who lives by himself. He is widowed but still cares for his mother in law abut she will move in next month 17 miles away to be closer to his sister in Sports coach.  He presents today for an Initial Medicare Annual Wellness Visit and to have protime rechecked.  Patient taking warfarin for PAF.  Denies s/s of bleeding or thromboembolism.    Current Medications (verified) Outpatient Encounter Prescriptions as of 11/24/2014  Medication Sig  . acetaminophen (TYLENOL) 325 MG tablet Take 650 mg by mouth as directed.    . Cholecalciferol (VITAMIN D3) 2000 UNITS TABS Take 1 tablet by mouth daily.   . disopyramide (NORPACE) 150 MG capsule Take 2 capsules (300 mg total) by mouth 2 (two) times daily.  . hydrochlorothiazide (HYDRODIURIL) 25 MG tablet Take 1 tablet (25 mg total) by mouth daily.  . mometasone (NASONEX) 50 MCG/ACT nasal spray Place 2 sprays into the nose daily.  Marland Kitchen omeprazole (PRILOSEC) 10 MG capsule Take 1 capsule (10 mg total) by mouth daily.  . Phentolamine-Alprostadil 0.5-20 MG-MCG/ML SOLN ** patient has not picked up from pharmacy yet 0.1-0.3 mLs by Intracavernosal route as needed. **phentolamin 0.5mg /papaverine 15mg /prostaglandin 53mcg per mL** (this is correct mixture). Do not use more than 3 times per week.  . polyethylene glycol powder (GLYCOLAX/MIRALAX) powder Take 255 g by mouth once.  . rosuvastatin (CRESTOR) 10 MG tablet Take 1 tablet (10 mg total) by mouth daily.  . verapamil (CALAN) 120 MG tablet Take 1 tablet (120 mg total) by mouth daily.  Marland Kitchen warfarin (COUMADIN) 4 MG tablet One  daily except  1&1/2 on Tues & Friday   Facility-Administered Encounter Medications as of 11/24/2014  Medication  . cyanocobalamin ((VITAMIN B-12)) injection 1,000 mcg   Patient has not had B12 injection in 1 month.  He is interested in trying oral B12.   Allergies  (verified) Tetracycline   History: Past Medical History  Diagnosis Date  . Atrial fibrillation     persistent  . Coronary artery disease   . Hypercholesteremia   . Hypercholesteremia   . Iron deficiency anemia, unspecified   . Other B-complex deficiencies   . Hx of adenomatous colonic polyps 04-14-2003    Colonoscopy  . Gastritis 06-10-2006  . Hiatal hernia 06-10-2006    EGD  . Hard of hearing   . Rheumatic fever   . COPD (chronic obstructive pulmonary disease)     PFT 05-05-2010, FEV1 .9 (26%) ratio 60  . Hypertension   . Hypertrophic obstructive cardiomyopathy     mild subaortic stenosis  . LVH (left ventricular hypertrophy)   . HOH (hard of hearing)   . Cataract    Past Surgical History  Procedure Laterality Date  . Coronary angioplasty      2002-2003  . Shoulder surgery    . Cardioversion  May 2012    [name]  . Cardiac catheterization  05/28/2005    Widely patent coronaries and normal LV function.  . Cardiac catheterization  08/11/2001    80% LAD stenosis successfully stented with a 3.5x73mm Cypher DES postdilated to 3.31mm resulting in reduction of 80% to 0%.  . Cardiac catheterization  01/15/2001    Focal 95% proximal RCA stenosis, stented with a 3x42mm Zeta multilink stent with postdilatation utilizing a 3.5x87mm Quantum balloon with stenosis being reduced to 0%.  Marland Kitchen  Cardiovascular stress test  05/29/2011    No scintigraphic evidence of inducible myocardial ischemia. No ECG changes. EKG negative for ischemia.  . Transthoracic echocardiogram  05/29/2011    EF >70%, severe concentric LV hypertrophy, severe mitral annular calcification, mild-moderate aortic regurg,    Family History  Problem Relation Age of Onset  . Colon cancer Paternal Grandfather   . Heart attack Brother 75    Ventircular rupture  . Emphysema Brother   . Lung cancer Brother   . Heart disease Mother     Enlarged heart  . Stroke Mother   . Hyperlipidemia Father   . CAD Father   . CAD Paternal  Uncle   . Stroke Paternal Uncle   . Cancer Maternal Grandfather     colon  . Asthma Brother    Social History   Occupational History  . retired Event organiser    Social History Main Topics  . Smoking status: Former Smoker -- 1.00 packs/day for 10 years    Types: Cigarettes    Quit date: 02/26/1985  . Smokeless tobacco: Never Used  . Alcohol Use: No  . Drug Use: No  . Sexual Activity: Yes    Do you feel safe at home?  Yes  Dietary issues and exercise activities discussed: Current Exercise Habits:: The patient has a physically strenous job, but has no regular exercise apart from work.  Current Dietary habits:  patient tries to limit fat intake and he eats a variety of foods - but he does c/o of eating too large serving sizes.     Cardiac Risk Factors include: advanced age (>70men, >47 women);dyslipidemia;family history of premature cardiovascular disease;hypertension;male gender;obesity (BMI >30kg/m2)  Objective:    Today's Vitals   11/24/14 1246  BP: 130/70  Pulse: 60  Height: 5\' 8"  (1.727 m)  Weight: 200 lb (90.719 kg)  PainSc: 0-No pain   Body mass index is 30.42 kg/(m^2).   INR = 2.6 today   Activities of Daily Living In your present state of health, do you have any difficulty performing the following activities: 11/24/2014  Hearing? N - patient has hearing aid and uses regularly  Vision? N  Difficulty concentrating or making decisions? N  Walking or climbing stairs? N  Dressing or bathing? N  Doing errands, shopping? N  Preparing Food and eating ? N  Using the Toilet? N  In the past six months, have you accidently leaked urine? N  Do you have problems with loss of bowel control? N  Managing your Medications? N  Managing your Finances? N  Housekeeping or managing your Housekeeping? N    Are there smokers in your home (other than you)? No    Depression Screen PHQ 2/9 Scores 11/24/2014 10/26/2014 10/20/2014 06/08/2014  PHQ - 2 Score 0 0 0 0    Fall  Risk Fall Risk  11/24/2014 10/26/2014 10/20/2014 06/08/2014 01/29/2014  Falls in the past year? No No No No No  Number falls in past yr: - - - - -    Cognitive Function: MMSE - Mini Mental State Exam 11/24/2014  Orientation to time 5  Orientation to Place 5  Registration 3  Attention/ Calculation 2  Recall 3  Language- name 2 objects 2  Language- repeat 1  Language- follow 3 step command 3  Language- read & follow direction 1  Write a sentence 1  Copy design 1  Total score 27    Immunizations and Health Maintenance Immunization History  Administered Date(s) Administered  .  Influenza Split 11/25/2012  . Influenza Whole 10/27/2009  . Influenza-Unspecified 11/10/2013  . Pneumococcal Conjugate-13 01/29/2013  . Pneumococcal Polysaccharide-23 04/27/2011   There are no preventive care reminders to display for this patient.  Patient Care Team: Chipper Herb, MD as PCP - General (Family Medicine) Troy Sine, MD as Consulting Physician (Cardiology) Ladene Artist, MD as Consulting Physician (Gastroenterology) Steffanie Rainwater, DPM as Consulting Physician (Podiatry)  Indicate any recent Medical Services you may have received from other than [facility] providers in the past year (date may be approximate).    Assessment:    Annual Wellness Visit  Therapeutic anticogaulation   Screening Tests Health Maintenance  Topic Date Due  . PNA vac Low Risk Adult (2 of 2 - PPSV23) 12/08/2014 (Originally 01/29/2014)  . INFLUENZA VACCINE  12/26/2014 (Originally 09/27/2014)  . Hepatitis C Screening  05/26/2015 (Originally 1945/07/19)  . COLON CANCER SCREENING ANNUAL FOBT  06/10/2015  . COLONOSCOPY  10/23/2016  . TETANUS/TDAP  09/26/2020  . ZOSTAVAX  Completed        Plan:   During the course of the visit Joshua [name] was educated and counseled about the following appropriate screening and preventive services:   Vaccines to include Pneumoccal, Influenza, Hepatitis B, Td, Zostavax - All labs are UTD except  influenza - appt made for flu vaccine for 2 weeks (patient would like to wait until mid October)  Colorectal cancer screening - UTD - both colonoscopy and FOBT  Cardiovascular disease screening - Lipid panel UTD and at goals; BP UTD and at goal.     Diabetes screening - UTD and WNL  Glaucoma screening / Eye Exam - UTD  Nutrition counseling - Discussed limiting caloric intake and smaller serving sizes.  Discussed using smaller plates and bowls.   Goal weight is 180# and discussed and explained BMI and goals.    Prostate cancer screening - UTD  Advanced directives - UTD  Trial of oral B12 - take 1032mcg once daily.  Recheck B12 level in 12 weeks.   Discussed increasing physical activity.  Encouraged to get back into kayak club and other activities he enjoys.  Anticoagulation Dose Instructions as of 11/24/2014      [name] Tue Wed Thu Fri Sat   New Dose 4 mg 6 mg 4 mg 6 mg 4 mg 6 mg 4 mg    Description        Continue current warfarin 4mg  dose of 1 and 1/2 tablet on mondays, wednesdays and fridays.  Take 1 tablet all other days.        Patient Instructions (the written plan) were given to the patient.   Cherre Robins, [facility]   11/24/2014

## 2014-11-24 NOTE — Patient Instructions (Signed)
  Joshua [name] , Thank you for taking time to come for your Medicare Wellness Visit. I appreciate your ongoing commitment to your health goals. Please review the following plan we discussed and let me know if I can assist you in the future.   These are the goals we discussed: Goals    None      This is a list of the screening recommended for you and due dates:  Health Maintenance  Topic Date Due  . Pneumonia vaccines (2 of 2 - PPSV23) Completed  . Flu Shot  12/11/2014  .  Hepatitis C: One time screening is recommended by Center for Disease Control  (CDC) for  adults born from [birth year] through 1966.   2016 - will check with next labs  . Stool Blood Test  06/10/2015  . Colon Cancer Screening  10/23/2016  . Tetanus Vaccine  09/26/2020  . Shingles Vaccine  Completed  *Topic was postponed. The date shown is not the original due date.   Anticoagulation Dose Instructions as of 11/24/2014      [name] Tue Wed Thu Fri Sat   New Dose 4 mg 6 mg 4 mg 6 mg 4 mg 6 mg 4 mg    Description        Continue current warfarin 4mg  dose of 1 and 1/2 tablet on mondays, wednesdays and fridays.  Take 1 tablet all other days.     INR was 2.6 today

## 2014-11-24 NOTE — Patient Instructions (Addendum)
  Mr. Testa , Thank you for taking time to come for your Medicare Wellness Visit. I appreciate your ongoing commitment to your health goals. Please review the following plan we discussed and let me know if I can assist you in the future.   These are the goals we discussed: Goals    None      This is a list of the screening recommended for you and due dates:  Health Maintenance  Topic Date Due  . Pneumonia vaccines (2 of 2 - PPSV23) Completed  . Flu Shot  12/11/2014  .  Hepatitis C: One time screening is recommended by Center for Disease Control  (CDC) for  adults born from [birth year] through 1966.   2016 - will check with next labs  . Stool Blood Test  06/10/2015  . Colon Cancer Screening  10/23/2016  . Tetanus Vaccine  09/26/2020  . Shingles Vaccine  Completed  *Topic was postponed. The date shown is not the original due date.   Anticoagulation Dose Instructions as of 11/24/2014      [name] Tue Wed Thu Fri Sat   New Dose 4 mg 6 mg 4 mg 6 mg 4 mg 6 mg 4 mg    Description        Continue current warfarin 4mg  dose of 1 and 1/2 tablet on mondays, wednesdays and fridays.  Take 1 tablet all other days.     INR was 2.6 today

## 2014-11-30 ENCOUNTER — Encounter: Payer: Self-pay | Admitting: Pharmacist Clinician (PhC)/ Clinical Pharmacy Specialist

## 2014-12-14 ENCOUNTER — Ambulatory Visit (INDEPENDENT_AMBULATORY_CARE_PROVIDER_SITE_OTHER): Payer: Medicare Other | Admitting: Pharmacist Clinician (PhC)/ Clinical Pharmacy Specialist

## 2014-12-14 DIAGNOSIS — I482 Chronic atrial fibrillation, unspecified: Secondary | ICD-10-CM

## 2014-12-14 DIAGNOSIS — Z23 Encounter for immunization: Secondary | ICD-10-CM

## 2014-12-14 DIAGNOSIS — I351 Nonrheumatic aortic (valve) insufficiency: Secondary | ICD-10-CM | POA: Diagnosis not present

## 2014-12-14 DIAGNOSIS — I480 Paroxysmal atrial fibrillation: Secondary | ICD-10-CM

## 2014-12-14 LAB — POCT INR: INR: 2.5

## 2014-12-24 ENCOUNTER — Telehealth: Payer: Self-pay | Admitting: Family Medicine

## 2014-12-24 MED ORDER — WARFARIN SODIUM 4 MG PO TABS: ORAL_TABLET | ORAL | Status: DC

## 2014-12-26 NOTE — Telephone Encounter (Signed)
Rx was sent with update directions to [facility].  Left message for patient about Rx.  He is to call office if there are further needs or questions.

## 2014-12-27 NOTE — Telephone Encounter (Signed)
Patient received Janotven from [place] and just wanted to make sure it was the same as warfarin/Coumadin.

## 2015-01-13 ENCOUNTER — Ambulatory Visit (INDEPENDENT_AMBULATORY_CARE_PROVIDER_SITE_OTHER): Payer: Medicare Other | Admitting: Nurse Practitioner

## 2015-01-13 VITALS — BP 142/76 | HR 84 | Temp 97.7°F | Ht 68.0 in | Wt 201.0 lb

## 2015-01-13 DIAGNOSIS — K5900 Constipation, unspecified: Secondary | ICD-10-CM | POA: Diagnosis not present

## 2015-01-13 NOTE — Progress Notes (Signed)
   Subjective:    Patient ID: Joshua [name], male    DOB: [birth year], 69 y.o.   MRN: [id number]  HPI Patient in today c/o constipation. Says that he had diarrhea Sunday- was fine Monday and had diarrhea again Tuesday. Constipation yesterday. Had a bowel movement yesterday and had to really strain to have bowel movement. Has ahd nausea when he smells food. Say sthat he also feels blotted. He has tried mirlax and that has not helped.   Review of Systems  Constitutional: Negative for fever.  HENT: Negative.   Respiratory: Negative.   Cardiovascular: Negative.   Gastrointestinal: Positive for nausea and constipation.  Genitourinary: Negative.   Musculoskeletal: Negative.   Neurological: Negative.   Psychiatric/Behavioral: Negative.   All other systems reviewed and are negative.      Objective:   Physical Exam  Constitutional: He is oriented to person, place, and time. He appears well-developed and well-nourished. No distress.  Cardiovascular: Normal rate and normal heart sounds.   Pulmonary/Chest: Effort normal and breath sounds normal.  Abdominal: Soft. Bowel sounds are normal. There is tenderness (right upper and left upper quadrant).  Neurological: He is alert and oriented to person, place, and time.  Skin: Skin is warm.  Psychiatric: He has a normal mood and affect. His behavior is normal. Judgment and thought content normal.    BP 142/76 mmHg  Pulse 84  Temp(Src) 97.7 F (36.5 C) (Oral)  Ht $R'5\' 8"'Jj$  (1.727 m)  Wt 201 lb (91.173 kg)  BMI 30.57 kg/m2       Assessment & Plan:   1. Constipation, unspecified constipation type   Milk of magnesia and prune juice Force fluids If no better in AM come back to office for KUB wil draw labs today to have in case no better tomorrow.  Orders Placed This Encounter  Procedures  . CBC with Differential/Platelet  . [place], FNP

## 2015-01-13 NOTE — Patient Instructions (Signed)

## 2015-01-14 ENCOUNTER — Encounter (HOSPITAL_COMMUNITY): Payer: Self-pay | Admitting: Neurology

## 2015-01-14 ENCOUNTER — Inpatient Hospital Stay (HOSPITAL_COMMUNITY)
Admission: EM | Admit: 2015-01-14 | Discharge: 2015-01-16 | DRG: 683 | Disposition: A | Discharge status: Home Health | Payer: Medicare Other | Attending: Internal Medicine | Admitting: Internal Medicine

## 2015-01-14 ENCOUNTER — Emergency Department (HOSPITAL_COMMUNITY): Payer: Medicare Other

## 2015-01-14 DIAGNOSIS — N390 Urinary tract infection, site not specified: Secondary | ICD-10-CM | POA: Diagnosis present

## 2015-01-14 DIAGNOSIS — N179 Acute kidney failure, unspecified: Principal | ICD-10-CM | POA: Diagnosis present

## 2015-01-14 DIAGNOSIS — D509 Iron deficiency anemia, unspecified: Secondary | ICD-10-CM | POA: Diagnosis present

## 2015-01-14 DIAGNOSIS — H9190 Unspecified hearing loss, unspecified ear: Secondary | ICD-10-CM | POA: Diagnosis present

## 2015-01-14 DIAGNOSIS — E785 Hyperlipidemia, unspecified: Secondary | ICD-10-CM

## 2015-01-14 DIAGNOSIS — D696 Thrombocytopenia, unspecified: Secondary | ICD-10-CM | POA: Diagnosis present

## 2015-01-14 DIAGNOSIS — I351 Nonrheumatic aortic (valve) insufficiency: Secondary | ICD-10-CM | POA: Diagnosis present

## 2015-01-14 DIAGNOSIS — E860 Dehydration: Secondary | ICD-10-CM | POA: Diagnosis present

## 2015-01-14 DIAGNOSIS — I959 Hypotension, unspecified: Secondary | ICD-10-CM | POA: Diagnosis present

## 2015-01-14 DIAGNOSIS — R748 Abnormal levels of other serum enzymes: Secondary | ICD-10-CM | POA: Diagnosis present

## 2015-01-14 DIAGNOSIS — I480 Paroxysmal atrial fibrillation: Secondary | ICD-10-CM | POA: Diagnosis present

## 2015-01-14 DIAGNOSIS — I421 Obstructive hypertrophic cardiomyopathy: Secondary | ICD-10-CM | POA: Diagnosis present

## 2015-01-14 DIAGNOSIS — E872 Acidosis, unspecified: Secondary | ICD-10-CM

## 2015-01-14 DIAGNOSIS — I2510 Atherosclerotic heart disease of native coronary artery without angina pectoris: Secondary | ICD-10-CM

## 2015-01-14 DIAGNOSIS — R791 Abnormal coagulation profile: Secondary | ICD-10-CM | POA: Diagnosis present

## 2015-01-14 DIAGNOSIS — Z7901 Long term (current) use of anticoagulants: Secondary | ICD-10-CM | POA: Diagnosis not present

## 2015-01-14 DIAGNOSIS — A084 Viral intestinal infection, unspecified: Secondary | ICD-10-CM | POA: Diagnosis present

## 2015-01-14 DIAGNOSIS — K449 Diaphragmatic hernia without obstruction or gangrene: Secondary | ICD-10-CM | POA: Diagnosis present

## 2015-01-14 DIAGNOSIS — I447 Left bundle-branch block, unspecified: Secondary | ICD-10-CM | POA: Diagnosis present

## 2015-01-14 DIAGNOSIS — K7460 Unspecified cirrhosis of liver: Secondary | ICD-10-CM | POA: Diagnosis present

## 2015-01-14 DIAGNOSIS — W06XXXA Fall from bed, initial encounter: Secondary | ICD-10-CM | POA: Diagnosis present

## 2015-01-14 DIAGNOSIS — K625 Hemorrhage of anus and rectum: Secondary | ICD-10-CM | POA: Diagnosis not present

## 2015-01-14 DIAGNOSIS — R197 Diarrhea, unspecified: Secondary | ICD-10-CM | POA: Insufficient documentation

## 2015-01-14 DIAGNOSIS — H269 Unspecified cataract: Secondary | ICD-10-CM | POA: Diagnosis present

## 2015-01-14 DIAGNOSIS — R55 Syncope and collapse: Secondary | ICD-10-CM | POA: Diagnosis present

## 2015-01-14 DIAGNOSIS — Z79899 Other long term (current) drug therapy: Secondary | ICD-10-CM | POA: Diagnosis not present

## 2015-01-14 DIAGNOSIS — K529 Noninfective gastroenteritis and colitis, unspecified: Secondary | ICD-10-CM | POA: Diagnosis not present

## 2015-01-14 DIAGNOSIS — R1012 Left upper quadrant pain: Secondary | ICD-10-CM

## 2015-01-14 DIAGNOSIS — J449 Chronic obstructive pulmonary disease, unspecified: Secondary | ICD-10-CM | POA: Diagnosis present

## 2015-01-14 DIAGNOSIS — R112 Nausea with vomiting, unspecified: Secondary | ICD-10-CM

## 2015-01-14 DIAGNOSIS — Z86010 Personal history of colonic polyps: Secondary | ICD-10-CM | POA: Diagnosis not present

## 2015-01-14 DIAGNOSIS — I10 Essential (primary) hypertension: Secondary | ICD-10-CM | POA: Diagnosis present

## 2015-01-14 DIAGNOSIS — E876 Hypokalemia: Secondary | ICD-10-CM | POA: Diagnosis present

## 2015-01-14 DIAGNOSIS — S40812A Abrasion of left upper arm, initial encounter: Secondary | ICD-10-CM | POA: Diagnosis present

## 2015-01-14 DIAGNOSIS — Z87891 Personal history of nicotine dependence: Secondary | ICD-10-CM | POA: Diagnosis not present

## 2015-01-14 DIAGNOSIS — K7469 Other cirrhosis of liver: Secondary | ICD-10-CM | POA: Diagnosis not present

## 2015-01-14 DIAGNOSIS — K922 Gastrointestinal hemorrhage, unspecified: Secondary | ICD-10-CM | POA: Diagnosis present

## 2015-01-14 DIAGNOSIS — I9589 Other hypotension: Secondary | ICD-10-CM | POA: Diagnosis not present

## 2015-01-14 HISTORY — DX: Thrombocytopenia, unspecified: D69.6

## 2015-01-14 LAB — LACTIC ACID, PLASMA
Lactic Acid, Venous: 2.7 mmol/L (ref 0.5–2.0)
Lactic Acid, Venous: 2.1 mmol/L (ref 0.5–2.0)

## 2015-01-14 LAB — TYPE AND SCREEN
ABO/RH(D): A POS
Antibody Screen: NEGATIVE

## 2015-01-14 LAB — CMP14+EGFR
ALT: 22 IU/L (ref 0–44)
AST: 24 IU/L (ref 0–40)
Albumin/Globulin Ratio: 1 — ABNORMAL LOW (ref 1.1–2.5)
Albumin: 3.1 g/dL — ABNORMAL LOW (ref 3.5–4.8)
Alkaline Phosphatase: 79 IU/L (ref 39–117)
BUN/Creatinine Ratio: 19 (ref 10–22)
BUN: 21 mg/dL (ref 8–27)
Bilirubin Total: 0.5 mg/dL (ref 0.0–1.2)
CO2: 31 mmol/L — ABNORMAL HIGH (ref 18–29)
Calcium: 8.2 mg/dL — ABNORMAL LOW (ref 8.6–10.2)
Chloride: 94 mmol/L — ABNORMAL LOW (ref 97–106)
Creatinine, Ser: 1.08 mg/dL (ref 0.76–1.27)
GFR calc Af Amer: 80 mL/min/{1.73_m2} (ref >59)
GFR calc non Af Amer: 69 mL/min/{1.73_m2} (ref >59)
Globulin, Total: 3.1 g/dL (ref 1.5–4.5)
Glucose: 89 mg/dL (ref 65–99)
Potassium: 3.6 mmol/L (ref 3.5–5.2)
Sodium: 139 mmol/L (ref 136–144)
Total Protein: 6.2 g/dL (ref 6.0–8.5)

## 2015-01-14 LAB — I-STAT CG4 LACTIC ACID, ED
Lactic Acid, Venous: 6.04 mmol/L (ref 0.5–2.0)
Lactic Acid, Venous: 3.51 mmol/L (ref 0.5–2.0)

## 2015-01-14 LAB — COMPREHENSIVE METABOLIC PANEL
ALT: 21 U/L (ref 17–63)
AST: 38 U/L (ref 15–41)
Albumin: 2.2 g/dL — ABNORMAL LOW (ref 3.5–5.0)
Alkaline Phosphatase: 61 U/L (ref 38–126)
Anion gap: 16 — ABNORMAL HIGH (ref 5–15)
BUN: 37 mg/dL — ABNORMAL HIGH (ref 6–20)
CO2: 22 mmol/L (ref 22–32)
Calcium: 7.9 mg/dL — ABNORMAL LOW (ref 8.9–10.3)
Chloride: 100 mmol/L — ABNORMAL LOW (ref 101–111)
Creatinine, Ser: 2.81 mg/dL — ABNORMAL HIGH (ref 0.61–1.24)
GFR calc Af Amer: 25 mL/min — ABNORMAL LOW (ref >60)
GFR calc non Af Amer: 21 mL/min — ABNORMAL LOW (ref >60)
Glucose, Bld: 126 mg/dL — ABNORMAL HIGH (ref 65–99)
Potassium: 3.6 mmol/L (ref 3.5–5.1)
Sodium: 138 mmol/L (ref 135–145)
Total Bilirubin: 1.1 mg/dL (ref 0.3–1.2)
Total Protein: 5.1 g/dL — ABNORMAL LOW (ref 6.5–8.1)

## 2015-01-14 LAB — I-STAT CHEM 8, ED
BUN: 44 mg/dL — ABNORMAL HIGH (ref 6–20)
BUN: 34 mg/dL — ABNORMAL HIGH (ref 6–20)
Calcium, Ion: 0.94 mmol/L — ABNORMAL LOW (ref 1.13–1.30)
Calcium, Ion: 0.91 mmol/L — ABNORMAL LOW (ref 1.13–1.30)
Chloride: 100 mmol/L — ABNORMAL LOW (ref 101–111)
Chloride: 102 mmol/L (ref 101–111)
Creatinine, Ser: 2.7 mg/dL — ABNORMAL HIGH (ref 0.61–1.24)
Creatinine, Ser: 2.2 mg/dL — ABNORMAL HIGH (ref 0.61–1.24)
Glucose, Bld: 123 mg/dL — ABNORMAL HIGH (ref 65–99)
Glucose, Bld: 132 mg/dL — ABNORMAL HIGH (ref 65–99)
HCT: 61 % — ABNORMAL HIGH (ref 39.0–52.0)
HCT: 54 % — ABNORMAL HIGH (ref 39.0–52.0)
Hemoglobin: 20.7 g/dL — ABNORMAL HIGH (ref 13.0–17.0)
Hemoglobin: 18.4 g/dL — ABNORMAL HIGH (ref 13.0–17.0)
Potassium: 3.2 mmol/L — ABNORMAL LOW (ref 3.5–5.1)
Potassium: 2.9 mmol/L — ABNORMAL LOW (ref 3.5–5.1)
Sodium: 141 mmol/L (ref 135–145)
Sodium: 140 mmol/L (ref 135–145)
TCO2: 23 mmol/L (ref 0–100)
TCO2: 21 mmol/L (ref 0–100)

## 2015-01-14 LAB — CBC WITH DIFFERENTIAL/PLATELET
Basophils Absolute: 0.1 10*3/uL (ref 0.0–0.2)
Basos: 1 %
EOS (ABSOLUTE): 0.1 10*3/uL (ref 0.0–0.4)
Eos: 1 %
Hematocrit: 47.9 % (ref 37.5–51.0)
Hemoglobin: 16.3 g/dL (ref 12.6–17.7)
Immature Grans (Abs): 0 10*3/uL (ref 0.0–0.1)
Immature Granulocytes: 0 %
Lymphocytes Absolute: 1.6 10*3/uL (ref 0.7–3.1)
Lymphs: 19 %
MCH: 31.8 pg (ref 26.6–33.0)
MCHC: 34 g/dL (ref 31.5–35.7)
MCV: 94 fL (ref 79–97)
Monocytes Absolute: 0.6 10*3/uL (ref 0.1–0.9)
Monocytes: 8 %
Neutrophils Absolute: 6 10*3/uL (ref 1.4–7.0)
Neutrophils: 71 %
Platelets: 156 10*3/uL (ref 150–379)
RBC: 5.12 x10E6/uL (ref 4.14–5.80)
RDW: 13.7 % (ref 12.3–15.4)
WBC: 8.4 10*3/uL (ref 3.4–10.8)

## 2015-01-14 LAB — CBC
HCT: 55.3 % — ABNORMAL HIGH (ref 39.0–52.0)
HCT: 51.2 % (ref 39.0–52.0)
HCT: 47.2 % (ref 39.0–52.0)
Hemoglobin: 18.8 g/dL — ABNORMAL HIGH (ref 13.0–17.0)
Hemoglobin: 17 g/dL (ref 13.0–17.0)
Hemoglobin: 16 g/dL (ref 13.0–17.0)
MCH: 32.2 pg (ref 26.0–34.0)
MCH: 31.9 pg (ref 26.0–34.0)
MCH: 32 pg (ref 26.0–34.0)
MCHC: 34 g/dL (ref 30.0–36.0)
MCHC: 33.2 g/dL (ref 30.0–36.0)
MCHC: 33.9 g/dL (ref 30.0–36.0)
MCV: 94.9 fL (ref 78.0–100.0)
MCV: 96.1 fL (ref 78.0–100.0)
MCV: 94.4 fL (ref 78.0–100.0)
Platelets: 146 10*3/uL — ABNORMAL LOW (ref 150–400)
Platelets: 128 10*3/uL — ABNORMAL LOW (ref 150–400)
Platelets: 144 10*3/uL — ABNORMAL LOW (ref 150–400)
RBC: 5.83 MIL/uL — ABNORMAL HIGH (ref 4.22–5.81)
RBC: 5.33 MIL/uL (ref 4.22–5.81)
RBC: 5 MIL/uL (ref 4.22–5.81)
RDW: 13.8 % (ref 11.5–15.5)
RDW: 13.9 % (ref 11.5–15.5)
RDW: 13.9 % (ref 11.5–15.5)
WBC: 11.5 10*3/uL — ABNORMAL HIGH (ref 4.0–10.5)
WBC: 9 10*3/uL (ref 4.0–10.5)
WBC: 10 10*3/uL (ref 4.0–10.5)

## 2015-01-14 LAB — URINALYSIS, ROUTINE W REFLEX MICROSCOPIC
Glucose, UA: NEGATIVE mg/dL
Ketones, ur: NEGATIVE mg/dL
Nitrite: NEGATIVE
Protein, ur: 100 mg/dL — AB
Specific Gravity, Urine: 1.019 (ref 1.005–1.030)
pH: 5 (ref 5.0–8.0)

## 2015-01-14 LAB — I-STAT TROPONIN, ED
Troponin i, poc: 0.11 ng/mL (ref 0.00–0.08)
Troponin i, poc: 0.08 ng/mL (ref 0.00–0.08)

## 2015-01-14 LAB — URINE MICROSCOPIC-ADD ON

## 2015-01-14 LAB — C DIFFICILE QUICK SCREEN W PCR REFLEX
C Diff antigen: NEGATIVE
C Diff interpretation: NEGATIVE
C Diff toxin: NEGATIVE

## 2015-01-14 LAB — PROTIME-INR
INR: 4.68 — ABNORMAL HIGH (ref 0.00–1.49)
Prothrombin Time: 42.8 seconds — ABNORMAL HIGH (ref 11.6–15.2)

## 2015-01-14 LAB — ABO/RH: ABO/RH(D): A POS

## 2015-01-14 LAB — TROPONIN I: Troponin I: 0.09 ng/mL — ABNORMAL HIGH (ref <0.031)

## 2015-01-14 LAB — POC OCCULT BLOOD, ED: Fecal Occult Bld: POSITIVE — AB

## 2015-01-14 LAB — MAGNESIUM: Magnesium: 2.1 mg/dL (ref 1.7–2.4)

## 2015-01-14 MED ORDER — IOHEXOL 300 MG/ML  SOLN
25.0000 mL | Freq: Once | INTRAMUSCULAR | Status: AC | PRN
  Administered 2015-01-14: 25 mL via ORAL

## 2015-01-14 MED ORDER — DISOPYRAMIDE PHOSPHATE ER 150 MG PO CP12
300.0000 mg | ORAL_CAPSULE | Freq: Two times a day (BID) | ORAL | Status: DC
  Administered 2015-01-14 – 2015-01-16 (×4): 300 mg via ORAL
  Filled 2015-01-14 (×3): qty 2
  Filled 2015-01-14: qty 3
  Filled 2015-01-14 (×2): qty 2

## 2015-01-14 MED ORDER — POLYETHYLENE GLYCOL 3350 17 G PO PACK
17.0000 g | PACK | Freq: Two times a day (BID) | ORAL | Status: DC
  Administered 2015-01-14: 17 g via ORAL
  Filled 2015-01-14 (×4): qty 1

## 2015-01-14 MED ORDER — SODIUM CHLORIDE 0.9 % IV SOLN
INTRAVENOUS | Status: AC
  Administered 2015-01-14 – 2015-01-15 (×2): via INTRAVENOUS
  Filled 2015-01-14 (×3): qty 1000

## 2015-01-14 MED ORDER — ONDANSETRON HCL 4 MG/2ML IJ SOLN: 4.0000 mg | Freq: Four times a day (QID) | INTRAMUSCULAR | Status: DC | PRN

## 2015-01-14 MED ORDER — SODIUM CHLORIDE 0.9 % IV BOLUS (SEPSIS)
1000.0000 mL | Freq: Once | INTRAVENOUS | Status: AC
  Administered 2015-01-14: 1000 mL via INTRAVENOUS

## 2015-01-14 MED ORDER — METRONIDAZOLE IN NACL 5-0.79 MG/ML-% IV SOLN
500.0000 mg | Freq: Three times a day (TID) | INTRAVENOUS | Status: DC
  Administered 2015-01-14 – 2015-01-16 (×6): 500 mg via INTRAVENOUS
  Filled 2015-01-14 (×6): qty 100

## 2015-01-14 MED ORDER — SODIUM CHLORIDE 0.9 % IJ SOLN
3.0000 mL | Freq: Two times a day (BID) | INTRAMUSCULAR | Status: DC
  Administered 2015-01-14 – 2015-01-16 (×5): 3 mL via INTRAVENOUS

## 2015-01-14 MED ORDER — ALUM & MAG HYDROXIDE-SIMETH 200-200-20 MG/5ML PO SUSP: 30.0000 mL | Freq: Four times a day (QID) | ORAL | Status: DC | PRN

## 2015-01-14 MED ORDER — VITAMIN K1 10 MG/ML IJ SOLN
10.0000 mg | Freq: Once | INTRAVENOUS | Status: AC
  Administered 2015-01-14: 10 mg via INTRAVENOUS
  Filled 2015-01-14: qty 1

## 2015-01-14 MED ORDER — ONDANSETRON HCL 4 MG PO TABS: 4.0000 mg | ORAL_TABLET | Freq: Four times a day (QID) | ORAL | Status: DC | PRN

## 2015-01-14 MED ORDER — POTASSIUM CHLORIDE 10 MEQ/100ML IV SOLN
10.0000 meq | INTRAVENOUS | Status: AC
  Administered 2015-01-14 (×2): 10 meq via INTRAVENOUS
  Filled 2015-01-14 (×3): qty 100

## 2015-01-14 MED ORDER — SODIUM CHLORIDE 0.9 % IV SOLN: INTRAVENOUS | Status: DC

## 2015-01-14 MED ORDER — FLUTICASONE PROPIONATE 50 MCG/ACT NA SUSP
1.0000 | Freq: Every day | NASAL | Status: DC
  Filled 2015-01-14: qty 16

## 2015-01-14 MED ORDER — CIPROFLOXACIN IN D5W 400 MG/200ML IV SOLN
400.0000 mg | Freq: Two times a day (BID) | INTRAVENOUS | Status: DC
  Administered 2015-01-14 – 2015-01-16 (×4): 400 mg via INTRAVENOUS
  Filled 2015-01-14 (×4): qty 200

## 2015-01-14 MED ORDER — PANTOPRAZOLE SODIUM 40 MG PO TBEC
40.0000 mg | DELAYED_RELEASE_TABLET | Freq: Every day | ORAL | Status: DC
  Administered 2015-01-14 – 2015-01-16 (×3): 40 mg via ORAL
  Filled 2015-01-14 (×3): qty 1

## 2015-01-14 MED ORDER — DISOPYRAMIDE PHOSPHATE 150 MG PO CAPS
300.0000 mg | ORAL_CAPSULE | Freq: Two times a day (BID) | ORAL | Status: DC
  Filled 2015-01-14: qty 2

## 2015-01-14 MED ORDER — ACETAMINOPHEN 325 MG PO TABS
650.0000 mg | ORAL_TABLET | Freq: Four times a day (QID) | ORAL | Status: DC | PRN
Start: 2015-01-14 — End: 2015-01-16

## 2015-01-14 MED ORDER — ACETAMINOPHEN 650 MG RE SUPP
650.0000 mg | Freq: Four times a day (QID) | RECTAL | Status: DC | PRN
Start: 2015-01-14 — End: 2015-01-16

## 2015-01-14 NOTE — Progress Notes (Signed)
New Admission Note:  Arrival Method: Ed stretcher Mental Orientation: alert and oriented Telemetry: NSR Assessment: Completed Skin: tare on left elbow  IV: present Pain: 0 Tubes:0 Safety Measures: Safety Fall Prevention Plan was given, discussed and signed. Admission: Completed [facility] Orientation: Patient has been orientated to the room, unit and the staff. Family:not present on admission  Orders have been reviewed and implemented. Will continue to monitor the patient. Call light has been placed within reach and bed alarm has been activated.   Fabian Sharp, RN  Phone Number: [phone]

## 2015-01-14 NOTE — H&P (Signed)
[facility] History and Physical  Joshua [name] [id number] DOB: Aug 31, 1945 DOA: 01/14/2015  Referring physician: Dr. Billy Fischer PCP: Redge Gainer, MD   Chief Complaint: Vomiting, diarrhea, syncope  HPI: Joshua [name] is a 69 y.o. male with CAD, hyperlipidemia, HOCM, COPD and Afib on Coumadin who presents to the ED with complain of severe diarrhea, with bright red bloody stools and vomiting this morning with associated abdominal pain, bloating and syncope. He reports passing out at home and tearing the skin on his left forearm but denies hitting his head. He called EMS this morning and was transported to the ED , BP during transport was 76/54 and he was given IV fluids. On arrival at the ED his heart rate was normal, systolic was [id number] and he was A&O x 4. He says he has been feeling unwell since Sunday when he traveled to [place] for the day. He reports one episode of subjective fever on Monday and alternating diarrhea and constipation with bloating and gas. Yesterday he had nausea, anorexia, abdominal pain and presented to his PCP for evaluation and lab work, at that time his Creatine was 1.08 , BUN 21.   In the ED his creatinine is 2.7, increased from 1.08 yesterday, hemoglobin of 20.7, troponin of 0.11, lactate of 6.2, and elevated INR of 4.68. CT abdomen pelvis shows colitis of the distal colon and terminal ileum with no evidence of perforation. After IV hydration his lactate was 3.51 with creatinine of 2.20. UA contained bacteria and leukocytes.  Review of Systems:  Constitutional: Generalized weakness,no weight loss, night sweats, fevers, chills, fatigue.  HEENT: No headaches, Difficulty swallowing,Tooth/dental problems,Sore throat, No sneezing, itching, ear ache, nasal congestion, post nasal drip,  Cardio-vascular: Positive for dizziness, no chest pain, Orthopnea, PND, swelling in lower extremities, anasarca, palpitations  GI: Positive for abdominal pain, nausea, vomiting, diarrhea  and anorexia  .No heartburn  Resp: No shortness of breath with exertion or at rest. No excess mucus, no productive cough, No non-productive cough, No coughing up of blood.No change in color of mucus.No wheezing.No chest wall deformity  Skin: Skin tear on left forearm, 2"x3", chronic discoloration of bilateral lower extremities. GU: no dysuria, change in color of urine, no urgency or frequency. No flank pain.  Musculoskeletal: No joint pain or swelling. No decreased range of motion. No back pain.  Psych: No change in mood or affect. No depression or anxiety. No memory loss.   Past Medical History  Diagnosis Date  . Atrial fibrillation (HCC)     persistent  . Coronary artery disease   . Hypercholesteremia   . Hypercholesteremia   . Iron deficiency anemia, unspecified   . Other B-complex deficiencies   . Hx of adenomatous colonic polyps 04-14-2003    Colonoscopy  . Gastritis 06-10-2006  . Hiatal hernia 06-10-2006    EGD  . Hard of hearing   . Rheumatic fever   . COPD (chronic obstructive pulmonary disease) (HCC)     PFT 05-05-2010, FEV1 .9 (26%) ratio 60  . Hypertension   . Hypertrophic obstructive cardiomyopathy     mild subaortic stenosis  . LVH (left ventricular hypertrophy)   . HOH (hard of hearing)   . Cataract    Past Surgical History  Procedure Laterality Date  . Coronary angioplasty      20 03-2001  . Shoulder surgery    . Cardioversion  May 2012    [name]  . Cardiac catheterization  05/28/2005    Widely patent coronaries and normal LV  function.  . Cardiac catheterization  08/11/2001    80% LAD stenosis successfully stented with a 3.5x67mm Cypher DES postdilated to 3.2mm resulting in reduction of 80% to 0%.  . Cardiac catheterization  01/15/2001    Focal 95% proximal RCA stenosis, stented with a 3x42mm Zeta multilink stent with postdilatation utilizing a 3.5x34mm Quantum balloon with stenosis being reduced to 0%.  . Cardiovascular stress test  05/29/2011    No  scintigraphic evidence of inducible myocardial ischemia. No ECG changes. EKG negative for ischemia.  . Transthoracic echocardiogram  05/29/2011    EF >70%, severe concentric LV hypertrophy, severe mitral annular calcification, mild-moderate aortic regurg,    Social History:  reports that he quit smoking about 29 years ago. His smoking use included Cigarettes. He has a 10 pack-year smoking history. He has never used smokeless tobacco. He reports that he does not drink alcohol or use illicit drugs.  Allergies  Allergen Reactions  . Tetracycline Nausea And Vomiting    Family History  Problem Relation Age of Onset  . Colon cancer Paternal Grandfather   . Heart attack Brother 57    Ventircular rupture  . Emphysema Brother   . Lung cancer Brother   . Heart disease Mother     Enlarged heart  . Stroke Mother   . Hyperlipidemia Father   . CAD Father   . CAD Paternal Uncle   . Stroke Paternal Uncle   . Cancer Maternal Grandfather     colon  . Asthma Brother      Prior to Admission medications   Medication Sig Start Date End Date Taking? Authorizing Provider  acetaminophen (TYLENOL) 325 MG tablet Take 650 mg by mouth as directed.     Yes Historical Provider, MD  Cholecalciferol (VITAMIN D3) 2000 UNITS TABS Take 1 tablet by mouth daily.    Yes Historical Provider, MD  cyanocobalamin ([employer] VITAMIN B12) 1000 MCG tablet Take 1 tablet (1,000 mcg total) by mouth daily. 11/24/14  Yes Tammy Eckard, PHARMD  disopyramide (NORPACE) 150 MG capsule Take 2 capsules (300 mg total) by mouth 2 (two) times daily. 10/26/14  Yes Chipper Herb, MD  hydrochlorothiazide (HYDRODIURIL) 25 MG tablet Take 1 tablet (25 mg total) by mouth daily. 10/26/14  Yes Chipper Herb, MD  mometasone (NASONEX) 50 MCG/ACT nasal spray Place 2 sprays into the nose daily. Patient taking differently: Place 2 sprays into the nose daily as needed.  10/26/14  Yes Chipper Herb, MD  omeprazole (PRILOSEC) 10 MG capsule Take 1 capsule (10 mg  total) by mouth daily. 10/26/14  Yes Chipper Herb, MD  polyethylene glycol powder (GLYCOLAX/MIRALAX) powder Take 255 g by mouth once. 09/16/14  Yes Tiffany A Gann, PA-C  rosuvastatin (CRESTOR) 10 MG tablet Take 1 tablet (10 mg total) by mouth daily. 10/26/14  Yes Chipper Herb, MD  verapamil (CALAN) 120 MG tablet Take 1 tablet (120 mg total) by mouth daily. Patient taking differently: Take 60 mg by mouth daily.  10/26/14  Yes Chipper Herb, MD  warfarin (COUMADIN) 4 MG tablet Take 4 mg by mouth every Wednesday. Only on Wednesday   Yes Historical Provider, MD  warfarin (COUMADIN) 5 MG tablet Take 5 mg by mouth daily. Patient takes 5 mg every day except on Wednesday   Yes Historical Provider, MD   Physical Exam: Filed Vitals:   01/14/15 1200 01/14/15 1230 01/14/15 1300 01/14/15 1330  BP: 96/44 99/50 112/56 117/52  Pulse: 81 82 83 84  Temp:  TempSrc:      Resp: 23  19 18   SpO2: 94% 92% 93% 93%    Wt Readings from Last 3 Encounters:  01/13/15 91.173 kg (201 lb)  11/24/14 90.719 kg (200 lb)  10/26/14 90.719 kg (200 lb)    General:  Appears calm and comfortable, increased respirations Eyes: PERRL, normal lids, irises & conjunctiva ENT: grossly normal hearing, lips & tongue Neck: no LAD, masses or thyromegaly Cardiovascular: Murmur present, RRR, no m/r/g. No LE edema. Respiratory: CTA bilaterally, no w/r/r. Normal respiratory effort. Abdomen: Pain and tenderness to palpation LLQ, slightly distended Skin: Tear on left forearm 2"x3", bilateral lower extremity discoloration, no rash or induration seen on limited exam Musculoskeletal: No edema, grossly normal tone BUE/BLE Psychiatric: grossly normal mood and affect, speech fluent and appropriate Neurologic: grossly non-focal.          Labs on Admission:  Basic Metabolic Panel:  Recent Labs Lab 01/13/15 1728 01/14/15 0851 01/14/15 0908 01/14/15 1221  NA 139 138 141 140  K 3.6 3.6 3.2* 2.9*  CL 94* 100* 100* 102  CO2 31* 22   --   --   GLUCOSE 89 126* 123* 132*  BUN 21 37* 44* 34*  CREATININE 1.08 2.81* 2.70* 2.20*  CALCIUM 8.2* 7.9*  --   --    Liver Function Tests:  Recent Labs Lab 01/13/15 1728 01/14/15 0851  AST 24 38  ALT 22 21  ALKPHOS 79 61  BILITOT 0.5 1.1  PROT 6.2 5.1*  ALBUMIN 3.1* 2.2*   CBC:  Recent Labs Lab 01/13/15 1728 01/14/15 0851 01/14/15 0908 01/14/15 1221 01/14/15 1329  WBC 8.4 11.5*  --   --  9.0  NEUTROABS 6.0  --   --   --   --   HGB  --  18.8* 20.7* 18.4* 17.0  HCT 47.9 55.3* 61.0* 54.0* 51.2  MCV  --  94.9  --   --  96.1  PLT  --  146*  --   --  128*    Radiological Exams on Admission: Ct Abdomen Pelvis Wo Contrast  01/14/2015  CLINICAL DATA:  Bright red bloody diarrhea since last night, syncope, nausea and vomiting since yesterday ; coronary artery disease, hiatal hernia, COPD, hypertension, atrial fibrillation EXAM: CT ABDOMEN AND PELVIS WITHOUT CONTRAST TECHNIQUE: Multidetector CT imaging of the abdomen and pelvis was performed following the standard protocol without IV contrast. Sagittal and coronal MPR images reconstructed from axial data set. Patient drank dilute oral contrast for exam. COMPARISON:  10/18/2011 FINDINGS: Pleural calcification and chronic pleural collections at both lung bases stable. Mitral annular calcification. Bibasilar atelectasis greater on RIGHT. Liver margins appear slightly nodular raising question of cirrhosis. No other focal abnormalities of the liver, gallbladder, spleen, pancreas, kidneys, or adrenals. Mild scattered atherosclerotic calcifications. Mild infiltrative changes of the mesentery with associated normal size mesenteric nodes, nonspecific, question fibrosing mesenteritis. Scattered areas of bowel wall thickening involving the terminal ileum and rectosigmoid colon suggesting an enteritis. Stomach and remaining bowel loops grossly unremarkable. Normal appendix. No mass, adenopathy, free air, or free fluid. BILATERAL inguinal hernias  containing fat. Osseous demineralization without focal bony abnormality. IMPRESSION: Scattered areas of bowel wall thickening involving the distal colon and terminal ileum, raising question of enteritis such as from infection or inflammatory bowel disease, with ischemia considered less likely due to distribution. No evidence of bowel obstruction or perforation. Scattered mesenteric hazy infiltration and with scattered normal size mesenteric lymph nodes, could reflect fibrosing mesenteritis or less likely mesenteric  adenitis. Chronic BILATERAL basilar pleural fluid collections with calcifications unchanged since 2013. Question cirrhotic liver. Electronically Signed   By: Lavonia Dana M.D.   On: 01/14/2015 12:19    EKG: Independently reviewed. Left bundle branch block, sinus rhythm  Assessment/Plan Principal Problem:   Lactic acidosis Active Problems:   Coronary atherosclerosis   Hypertrophic obstructive cardiomyopathy (HCC)   PAF (paroxysmal atrial fibrillation) (HCC)   Aortic valve insufficiency   Hyperlipidemia LDL goal <70   Acute gastroenteritis   Acute kidney injury ([place])   Syncope and collapse   Hypokalemia, gastrointestinal losses   UTI (lower urinary tract infection)   Lower GI bleeding   Elevated INR   Lactic acidosis with Acute Kidney Injury / syncope and collapse:  Likely due to severe dehydration ,prolonged diarrhea and vomiting, as well as UTI. Lactic acid 6.04 trending down with IV hydration, now 3.51.  Creatinine 2.8, baseline 1.0. BUN 37, baseline 21. Repeat Q3H.  Continue IVF.  Elevated troponin Likely due to acute renal failure. Initial troponin was 0.11. Patient denies chest pain. EKG shows left bundle branch block with no specific ST changes. Will check another troponin at 4 PM today.  Acute gastroenteritis with colitis on CT: Possibly food poisoning +/- infection.  No history of IBD.  Enteric precautions, check C-diff and GI pathogen panel Started on empiric Cipro  / Flagyl.  IVF, Supportive care, Clear liquid diet, advance as tolerated.  [facility] is consulted  Lower GI bleed:   Painless, Bright red blood per rectum, 2 episodes. No drop in hemoglobin.  We will cycle CBCs. Place on twice a day Muro lax to soften stool and cleanse colon. [facility] consulted  Elevated INR: 4.68 on admission. IV Vitamin K given. Hold home Coumadin.  UTI: UA shows bacturia, casts, and WBC. Follow culture. Cipro and and Flagyl started.  Hypokalemia: Potassium given in IV fluids and IV runs.. Check magnesium. Repeat chem in the AM.  Hyperlipidemia: Holding statin while acutely ill and vomiting.  Hyper Obstructive Cardiomyopathy / Aortic valve insufficiency:  Patient received 3 L of fluid in the ER and will be placed on 100 mL per hour. Monitor closely and avoid low volume state.  Hypertension Holding blood pressure medications at this time due to soft BP and dehydration.    Code Status: Full DVT Prophylaxis: None, INR elevated, on chronic coumadin. Family Communication: None at bedside, Patient is alert, orientated and understands their plan of care. Disposition Plan: Likely to home in approximately 4 days pending improvement.  Time spent: 70 minutes  Haskel Khan, [place] [phone] [facility]

## 2015-01-14 NOTE — Consult Note (Signed)
[facility] Consult: 1:55 PM 01/14/2015  LOS: 0 days    Referring Provider: Dr Renne Crigler.   Primary Care Physician:  Redge Gainer, MD Primary Gastroenterologist:  Dr. Fuller Plan.     Reason for Consultation:  Bloody diarrhea.   HPI: Joshua [name] is a 69 y.o. male.  On Coumadin and low dose ASA for A fib.  3/20015 EF 60 - 65%.  Iron def anemia. HTN, HLD. COPD  09/2011  Colonoscopy for personal (2005) and FHx colon polyps and cancer (m grandpa).  Dr Sharlett Iles. Removed three 3 - 5 mm sigmoid polyps.  Path: tubular adenomas without HGD and HP polyps.  05/2006 EGD.  HH and gastritis.   Patient has been having problems with nosebleeds for many weeks. In the summer he had a more significant episode of epistaxis but in the last month or so he's been having blood emerging from left nostril when he blows his nose.  He Nods this with tissue paper. Patient's BMs are normally twice daily, brown and formed. He takes MiraLAX every night. Yesterday he felt more distended and hadn't had a bowel movement. Was seen by his PMD who told him to take milk of magnesia. Early yesterday evening he took 4 tablespoons of milk of magnesia which worked almost immediately. He started having multiple loose, brown, large bowel movements. A couple of hours later at about 10 PM he began vomiting, this was not bloody or coffee grounds. The first emesis was of partially digested lunch, subsequent emesis was clear. He had a syncopal spell while sitting on the side of the bed and caused abrasion of his left arm after he fell. He is not sure how long he was unconscious. However he did not seek medical attention until later. He had the diarrhea all night long and at 5 AM he started having bloody bowel movements. He denies abdominal pain, in fact the distention he  had earlier that day resolved with altered bowel movements. He finally called EMS at 7 AM and was brought to the emergency room  BP as low as 90s/ 40s.  Pulse in 80s.  Hgb 18.8 to 17.  Platelets 128.  MCV 96.  Coags 42/4.6.   CT scan abdomen pelvis shows scattered wall thickening in the terminal ileum, distal colon raising question of infectious versus IBD enteritis.  Ischemia less likely given distribution of changes. Scattered mesenteric haziness, rule out fibrosing medicine to write [place] or less likely mesenteric adenitis. Question cirrhotic liver.  Patient does not use NSAIDs. He says about once a week he has a day where he has no bowel movements but has never seen bloody stools before.  Patient doesn't consume alcohol. He has never been anything more than a very minor consumer of EtOH as a younger man.    Past Medical History  Diagnosis Date  . Atrial fibrillation ([place])     persistent  . Coronary artery disease   . Hypercholesteremia   . Hypercholesteremia   . Iron deficiency anemia, unspecified   . Other B-complex deficiencies   . Hx  of adenomatous colonic polyps 04-14-2003    Colonoscopy  . Gastritis 06-10-2006  . Hiatal hernia 06-10-2006    EGD  . Hard of hearing   . Rheumatic fever   . COPD (chronic obstructive pulmonary disease) ([place])     PFT 05-05-2010, FEV1 .9 (26%) ratio 60  . Hypertension   . Hypertrophic obstructive cardiomyopathy     mild subaortic stenosis  . LVH (left ventricular hypertrophy)   . HOH (hard of hearing)   . Cataract     Past Surgical History  Procedure Laterality Date  . Coronary angioplasty      2002-2003  . Shoulder surgery    . Cardioversion  May 2012    [name]  . Cardiac catheterization  05/28/2005    Widely patent coronaries and normal LV function.  . Cardiac catheterization  08/11/2001    80% LAD stenosis successfully stented with a 3.5x57mm Cypher DES postdilated to 3.52mm resulting in reduction of 80% to 0%.  . Cardiac catheterization   01/15/2001    Focal 95% proximal RCA stenosis, stented with a 3x46mm Zeta multilink stent with postdilatation utilizing a 3.5x79mm Quantum balloon with stenosis being reduced to 0%.  . Cardiovascular stress test  05/29/2011    No scintigraphic evidence of inducible myocardial ischemia. No ECG changes. EKG negative for ischemia.  . Transthoracic echocardiogram  05/29/2011    EF >70%, severe concentric LV hypertrophy, severe mitral annular calcification, mild-moderate aortic regurg,     Prior to Admission medications   Medication Sig Start Date End Date Taking? Authorizing Provider  acetaminophen (TYLENOL) 325 MG tablet Take 650 mg by mouth as directed.     Yes Historical Provider, MD  Cholecalciferol (VITAMIN D3) 2000 UNITS TABS Take 1 tablet by mouth daily.    Yes Historical Provider, MD  cyanocobalamin ([facility] VITAMIN B12) 1000 MCG tablet Take 1 tablet (1,000 mcg total) by mouth daily. 11/24/14  Yes Tammy Eckard, PHARMD  disopyramide (NORPACE) 150 MG capsule Take 2 capsules (300 mg total) by mouth 2 (two) times daily. 10/26/14  Yes Chipper Herb, MD  hydrochlorothiazide (HYDRODIURIL) 25 MG tablet Take 1 tablet (25 mg total) by mouth daily. 10/26/14  Yes Chipper Herb, MD  mometasone (NASONEX) 50 MCG/ACT nasal spray Place 2 sprays into the nose daily. Patient taking differently: Place 2 sprays into the nose daily as needed.  10/26/14  Yes Chipper Herb, MD  omeprazole (PRILOSEC) 10 MG capsule Take 1 capsule (10 mg total) by mouth daily. 10/26/14  Yes Chipper Herb, MD  polyethylene glycol powder (GLYCOLAX/MIRALAX) powder Take 255 g by mouth once. 09/16/14  Yes Tiffany A Gann, PA-C  rosuvastatin (CRESTOR) 10 MG tablet Take 1 tablet (10 mg total) by mouth daily. 10/26/14  Yes Chipper Herb, MD  verapamil (CALAN) 120 MG tablet Take 1 tablet (120 mg total) by mouth daily. Patient taking differently: Take 60 mg by mouth daily.  10/26/14  Yes Chipper Herb, MD  warfarin (COUMADIN) 4 MG tablet Take 4 mg by  mouth every Wednesday. Only on Wednesday   Yes Historical Provider, MD  warfarin (COUMADIN) 5 MG tablet Take 5 mg by mouth daily. Patient takes 5 mg every day except on Wednesday   Yes Historical Provider, MD    Scheduled Meds: . polyethylene glycol  17 g Oral BID   Infusions: . ciprofloxacin 400 mg (01/14/15 1328)  . metronidazole 500 mg (01/14/15 1329)  . potassium chloride 10 mEq (01/14/15 1238)   PRN Meds:  Allergies as of 01/14/2015 - Review Complete 01/14/2015  Allergen Reaction Noted  . Tetracycline Nausea And Vomiting     Family History  Problem Relation Age of Onset  . Colon cancer Paternal Grandfather   . Heart attack Brother 76    Ventircular rupture  . Emphysema Brother   . Lung cancer Brother   . Heart disease Mother     Enlarged heart  . Stroke Mother   . Hyperlipidemia Father   . CAD Father   . CAD Paternal Uncle   . Stroke Paternal Uncle   . Cancer Maternal Grandfather     colon  . Asthma Brother     Social History   Social History  . Marital Status: Widowed    Spouse Name: N/A  . Number of Children: 5  . Years of Education: N/A   Occupational History  . retired Event organiser    Social History Main Topics  . Smoking status: Former Smoker -- 1.00 packs/day for 10 years    Types: Cigarettes    Quit date: 02/26/1985  . Smokeless tobacco: Never Used  . Alcohol Use: No  . Drug Use: No  . Sexual Activity: Yes   Other Topics Concern  . Not on file   Social History Narrative   Lives in [facility]   Married with two sons and three daughters    REVIEW OF SYSTEMS: Constitutional:  Patient has been trying to lose weight and weight has gone from 2:15 to 195 # in the last 10 months. Patient is active doing things around his property and tends to walk at least a few hours a day. ENT:  09/2014 Epistaxis in setting of supratherapeutic INR.  Pulm:  With his walking at and household chore activities, the patient will get fatigued but not  dyspneic. No cough. CV:  No palpitations, no LE edema. Feet are generally cool. Last night he noticed that they looked a little more discolored and bluish than usual. No chest pain GU:  No hematuria, no frequency GI:  No dysphagia. Heme:  No previous issues with anemia or need for iron supplementation.   Transfusions:  Does not recall ever having transfusions. Neuro:  No headaches, no peripheral tingling or numbness Derm:  No itching, no rash or sores.  Several years ago both his toenails on the large toes bilaterally were removed due to fungal infection Endocrine:  No sweats or chills.  No polyuria or dysuria Immunization:  Did not inquire as to recent shots Travel:  Not queried   PHYSICAL EXAM: Vital signs in last 24 hours: Filed Vitals:   01/14/15 1330  BP: 117/52  Pulse: 84  Temp:   Resp: 18   Wt Readings from Last 3 Encounters:  01/13/15 91.173 kg (201 lb)  11/24/14 90.719 kg (200 lb)  10/26/14 90.719 kg (200 lb)   General: Somewhat chronically ill appearing WM. He is comfortable. Head:  No facial asymmetry, swelling or signs of head trauma.  Eyes:  No scleral icterus, no conjunctival pallor. EOMI. Ears:  Hearing intact  Nose:  No discharge or congestion. Do not see any dried blood. Mouth:  Clear, moist, dentition in good repair. Neck:  No masses, no JVD, no TMG. Lungs:  Fine rales at the bases. No dyspnea. No cough. Heart: RRR. No MRG. S1/S2 audible. Abdomen:  Soft. Slightly distended. No appreciable ascites. No appreciable hepatosplenomegaly. No bruits, no hernias. Bowel sounds hypoactive but not tinkling or tympanitic.Marland Kitchen   Rectal: no DRE done.  BM  in bedpain 1/3 full of BRB/maroon/liquid stool, no blood clots.  Musc/Skeltl: No joint contractures or swelling. Extremities:  No edema. Feet are cool to the titration with somewhat slow capillary refill. Pedal pulses 2+ bilaterally. Violaceous discoloring to the skin from the knees to the feet bilaterally. Bandage skin tear on  left forearm Neurologic:  Oriented 3. Alert. Able to move all 4 limbs, strength not tested. No tremor, no asterixis. Skin:  Some purpura in the upper extremities but no significant bruises. Lower extremity discoloration as noted above. No sores or rash. Single telangiectasia on the upper chest. Tattoos:  None observed Nodes:  No cervical adenopathy.   Psych:  Pleasant, cooperative, calm.  Intake/Output from previous day:   Intake/Output this shift:    LAB RESULTS:  Recent Labs  01/13/15 1728  01/14/15 0851 01/14/15 0908 01/14/15 1221 01/14/15 1329  WBC 8.4  --  11.5*  --   --  9.0  HGB  --   < > 18.8* 20.7* 18.4* 17.0  HCT 47.9  < > 55.3* 61.0* 54.0* 51.2  PLT  --   --  146*  --   --  128*  < > = values in this interval not displayed. BMET Lab Results  Component Value Date   NA 140 01/14/2015   NA 141 01/14/2015   NA 138 01/14/2015   K 2.9* 01/14/2015   K 3.2* 01/14/2015   K 3.6 01/14/2015   CL 102 01/14/2015   CL 100* 01/14/2015   CL 100* 01/14/2015   CO2 22 01/14/2015   CO2 31* 01/13/2015   CO2 30* 10/26/2014   GLUCOSE 132* 01/14/2015   GLUCOSE 123* 01/14/2015   GLUCOSE 126* 01/14/2015   BUN 34* 01/14/2015   BUN 44* 01/14/2015   BUN 37* 01/14/2015   CREATININE 2.20* 01/14/2015   CREATININE 2.70* 01/14/2015   CREATININE 2.81* 01/14/2015   CALCIUM 7.9* 01/14/2015   CALCIUM 8.2* 01/13/2015   CALCIUM 8.7 10/26/2014   LFT  Recent Labs  01/13/15 1728 01/14/15 0851  PROT 6.2 5.1*  ALBUMIN 3.1* 2.2*  AST 24 38  ALT 22 21  ALKPHOS 79 61  BILITOT 0.5 1.1   PT/INR Lab Results  Component Value Date   INR 4.68* 01/14/2015   INR 2.5 12/14/2014   INR 2.6 11/24/2014   Hepatitis Panel No results for input(s): HEPBSAG, HCVAB, HEPAIGM, HEPBIGM in the last 72 hours. C-Diff No components found for: CDIFF Lipase  No results found for: LIPASE  Drugs of Abuse  No results found for: LABOPIA, COCAINSCRNUR, LABBENZ, AMPHETMU, THCU, LABBARB   RADIOLOGY  STUDIES: Ct Abdomen Pelvis Wo Contrast  01/14/2015  CLINICAL DATA:  Bright red bloody diarrhea since last night, syncope, nausea and vomiting since yesterday ; coronary artery disease, hiatal hernia, COPD, hypertension, atrial fibrillation EXAM: CT ABDOMEN AND PELVIS WITHOUT CONTRAST TECHNIQUE: Multidetector CT imaging of the abdomen and pelvis was performed following the standard protocol without IV contrast. Sagittal and coronal MPR images reconstructed from axial data set. Patient drank dilute oral contrast for exam. COMPARISON:  10/18/2011 FINDINGS: Pleural calcification and chronic pleural collections at both lung bases stable. Mitral annular calcification. Bibasilar atelectasis greater on RIGHT. Liver margins appear slightly nodular raising question of cirrhosis. No other focal abnormalities of the liver, gallbladder, spleen, pancreas, kidneys, or adrenals. Mild scattered atherosclerotic calcifications. Mild infiltrative changes of the mesentery with associated normal size mesenteric nodes, nonspecific, question fibrosing mesenteritis. Scattered areas of bowel wall thickening involving the terminal ileum and rectosigmoid colon suggesting  an enteritis. Stomach and remaining bowel loops grossly unremarkable. Normal appendix. No mass, adenopathy, free air, or free fluid. BILATERAL inguinal hernias containing fat. Osseous demineralization without focal bony abnormality. IMPRESSION: Scattered areas of bowel wall thickening involving the distal colon and terminal ileum, raising question of enteritis such as from infection or inflammatory bowel disease, with ischemia considered less likely due to distribution. No evidence of bowel obstruction or perforation. Scattered mesenteric hazy infiltration and with scattered normal size mesenteric lymph nodes, could reflect fibrosing mesenteritis or less likely mesenteric adenitis. Chronic BILATERAL basilar pleural fluid collections with calcifications unchanged since 2013.  Question cirrhotic liver. Electronically Signed   By: Lavonia Dana M.D.   On: 01/14/2015 12:19    ENDOSCOPIC STUDIES: Per HPI  IMPRESSION:   *  Bloody diarrhea with scattered regions of wall thickening within the terminal ileum and colon. Rule out infectious vs ischemic. The sudden, acute nature of his symptoms weigh against a diagnosis of Crohn's disease. CT scan also raising question of mesenteritis or mesenteric adenitis.  *  Question cirrhosis per CT.  No ascites observed on CT scan. Patient does have thrombocytopenia dating back to 2010.  *  Chronic Coumadin for history of atrial fibrillation.  Current INR 4.6, supratherapeutic  *   History of tubular adenomas and hyperplastic colon polyps.  *  History of hiatal hernia and gastritis in 2008 on EGD    PLAN:     *  Per Dr Donalda Ewings to have clears. Note he M.D. has ordered AGA pathogen panel and C. difficile PCR. and initiated empiric IV Cipro and metronidazole. Patient has received 10 mg of IV vitamin K.    Azucena Freed  01/14/2015, 1:55 PM Pager: [phone]

## 2015-01-14 NOTE — ED Notes (Signed)
Pt. Had moderate amount of maroon colored bloody stool in bed pan.

## 2015-01-14 NOTE — ED Provider Notes (Signed)
CSN: [id number]     Arrival date & time 01/14/15  [id number] History   First MD Initiated Contact with Patient 01/14/15 [phone]     Chief Complaint  Patient presents with  . GI Bleeding  . Hypotension     (Consider location/radiation/quality/duration/timing/severity/associated sxs/prior Treatment) Patient is a 69 y.o. male presenting with hematochezia.  Rectal Bleeding Quality:  Bright red Amount:  Moderate Timing:  Constant Progression:  Unchanged Chronicity:  New Context: diarrhea (had diarrhea, now only passing blood)   Context: not rectal injury and not rectal pain   Similar prior episodes: no   Relieved by:  Nothing Worsened by:  Nothing tried Ineffective treatments:  None tried Associated symptoms: abdominal pain ("tender" when pressed on) and vomiting   Associated symptoms: no fever and no light-headedness   Risk factors: no hx of colorectal cancer   Risk factors comment:  [facility]   Past Medical History  Diagnosis Date  . Atrial fibrillation ([place])     persistent  . Coronary artery disease   . Hypercholesteremia   . Hypercholesteremia   . Iron deficiency anemia, unspecified   . Other B-complex deficiencies   . Hx of adenomatous colonic polyps 2005, 2013    Colonoscopy  . Gastritis 06-10-2006  . Hiatal hernia 06-10-2006    EGD  . Hard of hearing   . Rheumatic fever   . COPD (chronic obstructive pulmonary disease) ([place])     PFT 05-05-2010, FEV1 .9 (26%) ratio 60  . Hypertension   . Hypertrophic obstructive cardiomyopathy     mild subaortic stenosis  . LVH (left ventricular hypertrophy)   . HOH (hard of hearing)   . Cataract   . Thrombocytopenia ([place]) 05/2008   Past Surgical History  Procedure Laterality Date  . Coronary angioplasty      2002-2003  . Shoulder surgery    . Cardioversion  May 2012    [name]  . Cardiac catheterization  05/28/2005    Widely patent coronaries and normal LV function.  . Cardiac catheterization  08/11/2001    80% LAD  stenosis successfully stented with a 3.5x55mm Cypher DES postdilated to 3.52mm resulting in reduction of 80% to 0%.  . Cardiac catheterization  01/15/2001    Focal 95% proximal RCA stenosis, stented with a 3x46mm Zeta multilink stent with postdilatation utilizing a 3.5x64mm Quantum balloon with stenosis being reduced to 0%.  . Cardiovascular stress test  05/29/2011    No scintigraphic evidence of inducible myocardial ischemia. No ECG changes. EKG negative for ischemia.  . Transthoracic echocardiogram  05/29/2011    EF >70%, severe concentric LV hypertrophy, severe mitral annular calcification, mild-moderate aortic regurg,    Family History  Problem Relation Age of Onset  . Colon cancer Paternal Grandfather   . Heart attack Brother 87    Ventircular rupture  . Emphysema Brother   . Lung cancer Brother   . Heart disease Mother     Enlarged heart  . Stroke Mother   . Hyperlipidemia Father   . CAD Father   . CAD Paternal Uncle   . Stroke Paternal Uncle   . Cancer Maternal Grandfather     colon  . Asthma Brother    Social History  Substance Use Topics  . Smoking status: Former Smoker -- 1.00 packs/day for 10 years    Types: Cigarettes    Quit date: 02/26/1985  . Smokeless tobacco: Never Used  . Alcohol Use: No    Review of Systems  Constitutional: Positive for  fatigue. Negative for fever.  HENT: Negative for sore throat.   Eyes: Negative for visual disturbance.  Respiratory: Negative for shortness of breath.   Cardiovascular: Negative for chest pain.  Gastrointestinal: Positive for nausea, vomiting, abdominal pain ("tender" when pressed on), diarrhea, hematochezia and anal bleeding. Negative for blood in stool (no stool left when startede passing blood).  Genitourinary: Positive for difficulty urinating (has not urinated since diarrhea began).  Musculoskeletal: Negative for back pain and neck stiffness.  Skin: Negative for rash.  Neurological: Negative for syncope, light-headedness  and headaches.      Allergies  Tetracycline  Home Medications   Prior to Admission medications   Medication Sig Start Date End Date Taking? Authorizing Provider  acetaminophen (TYLENOL) 325 MG tablet Take 650 mg by mouth as directed.     Yes Historical Provider, MD  Cholecalciferol (VITAMIN D3) 2000 UNITS TABS Take 1 tablet by mouth daily.    Yes Historical Provider, MD  cyanocobalamin ([facility] VITAMIN B12) 1000 MCG tablet Take 1 tablet (1,000 mcg total) by mouth daily. 11/24/14  Yes Tammy Eckard, PHARMD  disopyramide (NORPACE) 150 MG capsule Take 2 capsules (300 mg total) by mouth 2 (two) times daily. 10/26/14  Yes Chipper Herb, MD  hydrochlorothiazide (HYDRODIURIL) 25 MG tablet Take 1 tablet (25 mg total) by mouth daily. 10/26/14  Yes Chipper Herb, MD  mometasone (NASONEX) 50 MCG/ACT nasal spray Place 2 sprays into the nose daily. Patient taking differently: Place 2 sprays into the nose daily as needed.  10/26/14  Yes Chipper Herb, MD  omeprazole (PRILOSEC) 10 MG capsule Take 1 capsule (10 mg total) by mouth daily. 10/26/14  Yes Chipper Herb, MD  polyethylene glycol powder (GLYCOLAX/MIRALAX) powder Take 255 g by mouth once. 09/16/14  Yes Tiffany A Gann, PA-C  rosuvastatin (CRESTOR) 10 MG tablet Take 1 tablet (10 mg total) by mouth daily. 10/26/14  Yes Chipper Herb, MD  verapamil (CALAN) 120 MG tablet Take 1 tablet (120 mg total) by mouth daily. Patient taking differently: Take 60 mg by mouth daily.  10/26/14  Yes Chipper Herb, MD  warfarin (COUMADIN) 4 MG tablet Take 4 mg by mouth every Wednesday. Only on Wednesday   Yes Historical Provider, MD  warfarin (COUMADIN) 5 MG tablet Take 5 mg by mouth daily. Patient takes 5 mg every day except on Wednesday   Yes Historical Provider, MD   BP 101/51 mmHg  Pulse 83  Temp(Src) 97.5 F (36.4 C) (Oral)  Resp 18  SpO2 93% Physical Exam  Constitutional: He is oriented to person, place, and time. He appears well-developed and well-nourished. No  distress.  HENT:  Head: Normocephalic and atraumatic.  Eyes: EOM are normal.  Pale conjunctiva  Neck: Normal range of motion.  Cardiovascular: Normal rate, regular rhythm, normal heart sounds and intact distal pulses.  Exam reveals no gallop and no friction rub.   No murmur heard. Pulmonary/Chest: Effort normal and breath sounds normal. No respiratory distress. He has no wheezes. He has no rales.  Abdominal: Soft. He exhibits no distension. There is tenderness (LUQ). There is no guarding.  Musculoskeletal: He exhibits no edema.  Neurological: He is alert and oriented to person, place, and time.  Skin: Skin is warm and dry. He is not diaphoretic.  Nursing note and vitals reviewed.   ED Course  Procedures (including critical care time) Labs Review Labs Reviewed  COMPREHENSIVE METABOLIC PANEL - Abnormal; Notable for the following:    Chloride 100 (*)  Glucose, Bld 126 (*)    BUN 37 (*)    Creatinine, Ser 2.81 (*)    Calcium 7.9 (*)    Total Protein 5.1 (*)    Albumin 2.2 (*)    GFR calc non Af Amer 21 (*)    GFR calc Af Amer 25 (*)    Anion gap 16 (*)    All other components within normal limits  CBC - Abnormal; Notable for the following:    WBC 11.5 (*)    RBC 5.83 (*)    Hemoglobin 18.8 (*)    HCT 55.3 (*)    Platelets 146 (*)    All other components within normal limits  PROTIME-INR - Abnormal; Notable for the following:    Prothrombin Time 42.8 (*)    INR 4.68 (*)    All other components within normal limits  URINALYSIS, ROUTINE W REFLEX MICROSCOPIC (NOT AT [facility]) - Abnormal; Notable for the following:    APPearance HAZY (*)    Hgb urine dipstick LARGE (*)    Bilirubin Urine SMALL (*)    Protein, ur 100 (*)    Leukocytes, UA SMALL (*)    All other components within normal limits  URINE MICROSCOPIC-ADD ON - Abnormal; Notable for the following:    Squamous Epithelial / LPF 0-5 (*)    Bacteria, UA MANY (*)    Casts HYALINE CASTS (*)    All other components within  normal limits  CBC - Abnormal; Notable for the following:    Platelets 128 (*)    All other components within normal limits  LACTIC ACID, PLASMA - Abnormal; Notable for the following:    Lactic Acid, Venous 2.7 (*)    All other components within normal limits  TROPONIN I - Abnormal; Notable for the following:    Troponin I 0.09 (*)    All other components within normal limits  POC OCCULT BLOOD, ED - Abnormal; Notable for the following:    Fecal Occult Bld POSITIVE (*)    All other components within normal limits  I-STAT CG4 LACTIC ACID, ED - Abnormal; Notable for the following:    Lactic Acid, Venous 6.04 (*)    All other components within normal limits  I-STAT TROPOININ, ED - Abnormal; Notable for the following:    Troponin i, poc 0.11 (*)    All other components within normal limits  I-STAT CHEM 8, ED - Abnormal; Notable for the following:    Potassium 3.2 (*)    Chloride 100 (*)    BUN 44 (*)    Creatinine, Ser 2.70 (*)    Glucose, Bld 123 (*)    Calcium, Ion 0.94 (*)    Hemoglobin 20.7 (*)    HCT 61.0 (*)    All other components within normal limits  I-STAT CG4 LACTIC ACID, ED - Abnormal; Notable for the following:    Lactic Acid, Venous 3.51 (*)    All other components within normal limits  I-STAT CHEM 8, ED - Abnormal; Notable for the following:    Potassium 2.9 (*)    BUN 34 (*)    Creatinine, Ser 2.20 (*)    Glucose, Bld 132 (*)    Calcium, Ion 0.91 (*)    Hemoglobin 18.4 (*)    HCT 54.0 (*)    All other components within normal limits  C DIFFICILE QUICK SCREEN W PCR REFLEX  URINE CULTURE  MAGNESIUM  GI PATHOGEN PANEL BY PCR, STOOL  CBC  LACTIC ACID, PLASMA  BASIC METABOLIC PANEL  PROTIME-INR  CBC  I-STAT TROPOININ, ED  I-STAT CG4 LACTIC ACID, ED  I-STAT CG4 LACTIC ACID, ED  I-STAT TROPOININ, ED  TYPE AND SCREEN  ABO/RH    Imaging Review Ct Abdomen Pelvis Wo Contrast  01/14/2015  CLINICAL DATA:  Bright red bloody diarrhea since last night,  syncope, nausea and vomiting since yesterday ; coronary artery disease, hiatal hernia, COPD, hypertension, atrial fibrillation EXAM: CT ABDOMEN AND PELVIS WITHOUT CONTRAST TECHNIQUE: Multidetector CT imaging of the abdomen and pelvis was performed following the standard protocol without IV contrast. Sagittal and coronal MPR images reconstructed from axial data set. Patient drank dilute oral contrast for exam. COMPARISON:  10/18/2011 FINDINGS: Pleural calcification and chronic pleural collections at both lung bases stable. Mitral annular calcification. Bibasilar atelectasis greater on RIGHT. Liver margins appear slightly nodular raising question of cirrhosis. No other focal abnormalities of the liver, gallbladder, spleen, pancreas, kidneys, or adrenals. Mild scattered atherosclerotic calcifications. Mild infiltrative changes of the mesentery with associated normal size mesenteric nodes, nonspecific, question fibrosing mesenteritis. Scattered areas of bowel wall thickening involving the terminal ileum and rectosigmoid colon suggesting an enteritis. Stomach and remaining bowel loops grossly unremarkable. Normal appendix. No mass, adenopathy, free air, or free fluid. BILATERAL inguinal hernias containing fat. Osseous demineralization without focal bony abnormality. IMPRESSION: Scattered areas of bowel wall thickening involving the distal colon and terminal ileum, raising question of enteritis such as from infection or inflammatory bowel disease, with ischemia considered less likely due to distribution. No evidence of bowel obstruction or perforation. Scattered mesenteric hazy infiltration and with scattered normal size mesenteric lymph nodes, could reflect fibrosing mesenteritis or less likely mesenteric adenitis. Chronic BILATERAL basilar pleural fluid collections with calcifications unchanged since 2013. Question cirrhotic liver. Electronically Signed   By: Lavonia Dana M.D.   On: 01/14/2015 12:19   I have personally  reviewed and evaluated these images and lab results as part of my medical decision-making.   EKG Interpretation   Date/Time:  Friday January 14 2015 08:39:38 EST Ventricular Rate:  84 PR Interval:  216 QRS Duration: 140 QT Interval:  465 QTC Calculation: 550 R Axis:   -68 Text Interpretation:  Sinus rhythm Left bundle branch block No significant  change since last tracing Confirmed by [facility] MD, Dvid Pendry ([id number]) on  01/14/2015 9:00:50 AM      MDM   Final diagnoses:  Lactic acidosis  Rectal bleeding  Diarrhea, unspecified type  Dehydration  Hypotension, unspecified hypotension type   69 year old male with history of atrial fibrillation on Coumadin, aortic valve insufficiency, hyperlipidemia, coronary artery disease, hypertrophic obstructive cardiomyopathy presents with concern of diarrhea and rectal bleeding. Patient reports that he was distended and had abdominal pain yesterday and started a laxative last night, and developed significant diarrhea, and this morning began passing only bright red blood and clots per rectum. His blood pressures on EMS arrival were 0000000 and 70 systolic.  On arrival to our emergency department, patient's heart rate is normal, blood pressures [id number] systolic, he is mentating well, has pale conjunctivae, and bright red blood per rectum.  Hx not consistent with AAA rupture.  I-STAT chem 8 significant for creatinine of 2.7 increased from one yesterday, hemoglobin of 20.7, troponin of 0.11, and lactate of 6.  Patient denies any other infectious symptoms, other than diarrhea that had developed after taking laxatives, and have low suspicion for sepsis as cause of patient's elevated lactate.    Differential for lactate elevation includes hypotension secondary to dehydration from significant  diarrhea or bowel ischemia.  Patient's hgb is currently elevated at 18.8, likely secondary to hemoconcentration in setting of diarrhea, however he is passing bright red blood per  rectum in the ED.   INR is elevated to 4.68, and given unclear hx of whether pt is having life threatening bleed (given concern diarrhea is cause of hypotension, and pt hemoglobin is elevated) began by giving 10mg  of Vitamin K and awaiting further testing/reevaluation to decide on giving [facility].  Consulted GI given amount of blood passed per rectum, however bleeding likely secondary to colitis.  CT abdomen ordered to evaluate for signs of ischemic colitis, and given acute kidney injury was done without contrast.  CT shows colitis, however distribution not in ischemic distribution and more concerning for infectious or inflammatory colitis.   Patient is critically ill with elevated lactate, troponin (likely demand, holding blood thinners in setting of rectal bleeding), AKI, hypotension improved by IVF. Pt admitted by hospitalist.     Gareth Morgan, MD 01/14/15 [phone]

## 2015-01-14 NOTE — ED Notes (Addendum)
Pt had bm in bedpan. Very little stool, 1/3 of bed pan full of bright red blood, no clots. MD Scholossman made aware

## 2015-01-14 NOTE — Progress Notes (Signed)
Report Received from Martie Round, [place] in ED

## 2015-01-14 NOTE — Progress Notes (Signed)
CRITICAL VALUE ALERT  Critical value received:  Lactic 2.7  Date of notification:  01/14/15  Time of notification:  [id number]  Critical value read back:Yes.    Nurse who received alert:  Fabian Sharp, RN  MD notified (1st page):  [place]  Time of first page:  [phone]  MD notified (2nd page):  Time of second page:  Responding MD:  Cruzita Lederer  Time MD responded:  [id number]

## 2015-01-14 NOTE — ED Notes (Signed)
Per ems- pt comes from home where at 2100 he had n/v/d, this morning at 0430 had bright red blood stools. Initial BP 90/56 at his home, when in truck 76/54, given 1L NS, came up to [id number] systolic but dropped back to 96 systolic. Last night he passed out, and fell with skin tear to left arm. HR 81 regular, RR 22, 95 % RA, CBG 125. He takes coumadin and aspirin. EKG sent in for possible evaluation of ST elevation, no called stemi. Pt denies cp or sob but has generalized abdominal tenderness. Went to PCP yesterday for constipation, took laxative last night.

## 2015-01-15 DIAGNOSIS — N179 Acute kidney failure, unspecified: Principal | ICD-10-CM

## 2015-01-15 DIAGNOSIS — K7469 Other cirrhosis of liver: Secondary | ICD-10-CM

## 2015-01-15 DIAGNOSIS — K625 Hemorrhage of anus and rectum: Secondary | ICD-10-CM | POA: Diagnosis present

## 2015-01-15 DIAGNOSIS — E872 Acidosis: Secondary | ICD-10-CM

## 2015-01-15 LAB — BASIC METABOLIC PANEL
Anion gap: 6 (ref 5–15)
BUN: 32 mg/dL — ABNORMAL HIGH (ref 6–20)
CO2: 26 mmol/L (ref 22–32)
Calcium: 6.9 mg/dL — ABNORMAL LOW (ref 8.9–10.3)
Chloride: 104 mmol/L (ref 101–111)
Creatinine, Ser: 1.54 mg/dL — ABNORMAL HIGH (ref 0.61–1.24)
GFR calc Af Amer: 51 mL/min — ABNORMAL LOW (ref >60)
GFR calc non Af Amer: 44 mL/min — ABNORMAL LOW (ref >60)
Glucose, Bld: 115 mg/dL — ABNORMAL HIGH (ref 65–99)
Potassium: 3.8 mmol/L (ref 3.5–5.1)
Sodium: 136 mmol/L (ref 135–145)

## 2015-01-15 LAB — URINE CULTURE

## 2015-01-15 LAB — CBC
HCT: 43 % (ref 39.0–52.0)
Hemoglobin: 14.5 g/dL (ref 13.0–17.0)
MCH: 31.9 pg (ref 26.0–34.0)
MCHC: 33.7 g/dL (ref 30.0–36.0)
MCV: 94.7 fL (ref 78.0–100.0)
Platelets: 120 10*3/uL — ABNORMAL LOW (ref 150–400)
RBC: 4.54 MIL/uL (ref 4.22–5.81)
RDW: 14.1 % (ref 11.5–15.5)
WBC: 8.1 10*3/uL (ref 4.0–10.5)

## 2015-01-15 LAB — PROTIME-INR
INR: 1.54 — ABNORMAL HIGH (ref 0.00–1.49)
Prothrombin Time: 18.5 seconds — ABNORMAL HIGH (ref 11.6–15.2)

## 2015-01-15 LAB — LACTIC ACID, PLASMA: Lactic Acid, Venous: 1.3 mmol/L (ref 0.5–2.0)

## 2015-01-15 LAB — FERRITIN: Ferritin: 222 ng/mL (ref 24–336)

## 2015-01-15 MED ORDER — SODIUM CHLORIDE 0.9 % IV SOLN
INTRAVENOUS | Status: AC
  Administered 2015-01-15: 09:00:00 via INTRAVENOUS

## 2015-01-15 NOTE — Progress Notes (Signed)
[facility] ROUNDING NOTE   Subjective: Reports feeling significantly better. No further nausea, vomiting or diarrhea. Feels he is back to his baseline   Objective: Vital signs in last 24 hours: Temp:  [97.5 F (36.4 C)-98.3 F (36.8 C)] 98.3 F (36.8 C) (11/19 0911) Pulse Rate:  [74-87] 76 (11/19 0911) Resp:  [17-29] 18 (11/19 0526) BP: (96-119)/(36-56) 110/52 mmHg (11/19 0911) SpO2:  [92 %-97 %] 97 % (11/19 0526) Weight:  [207 lb 10.8 oz (94.2 kg)] 207 lb 10.8 oz (94.2 kg) (11/19 0911) Last BM Date: 01/15/15 General: NAD Lungs: b/l clear Heart: s1s2, no murmurs Abdomen: soft, NTND Ext: no edema   Intake/Output from previous day: 11/18 0701 - 11/19 0700 In: 685 [I.V.:185; IV Piggyback:500] Out: -  Intake/Output this shift: Total I/O In: 240 [P.O.:240] Out: -    Lab Results:  Recent Labs  01/14/15 1329 01/14/15 2141 01/15/15 0500  WBC 9.0 10.0 8.1  HGB 17.0 16.0 14.5  PLT 128* 144* 120*  MCV 96.1 94.4 94.7   BMET  Recent Labs  01/13/15 1728  01/14/15 0851 01/14/15 0908 01/14/15 1221 01/15/15 0500  NA 139  < > 138 141 140 136  K 3.6  --  3.6 3.2* 2.9* 3.8  CL 94*  --  100* 100* 102 104  CO2 31*  --  22  --   --  26  GLUCOSE 89  < > 126* 123* 132* 115*  BUN 21  < > 37* 44* 34* 32*  CREATININE 1.08  --  2.81* 2.70* 2.20* 1.54*  CALCIUM 8.2*  --  7.9*  --   --  6.9*  < > = values in this interval not displayed. LFT  Recent Labs  01/13/15 1728 01/14/15 0851  PROT 6.2 5.1*  ALBUMIN 3.1* 2.2*  AST 24 38  ALT 22 21  ALKPHOS 79 61  BILITOT 0.5 1.1   PT/INR  Recent Labs  01/14/15 0851 01/15/15 0500  INR 4.68* 1.54*      Imaging/Other results: Ct Abdomen Pelvis Wo Contrast  01/14/2015  CLINICAL DATA:  Bright red bloody diarrhea since last night, syncope, nausea and vomiting since yesterday ; coronary artery disease, hiatal hernia, COPD, hypertension, atrial fibrillation EXAM: CT ABDOMEN AND PELVIS WITHOUT CONTRAST  TECHNIQUE: Multidetector CT imaging of the abdomen and pelvis was performed following the standard protocol without IV contrast. Sagittal and coronal MPR images reconstructed from axial data set. Patient drank dilute oral contrast for exam. COMPARISON:  10/18/2011 FINDINGS: Pleural calcification and chronic pleural collections at both lung bases stable. Mitral annular calcification. Bibasilar atelectasis greater on RIGHT. Liver margins appear slightly nodular raising question of cirrhosis. No other focal abnormalities of the liver, gallbladder, spleen, pancreas, kidneys, or adrenals. Mild scattered atherosclerotic calcifications. Mild infiltrative changes of the mesentery with associated normal size mesenteric nodes, nonspecific, question fibrosing mesenteritis. Scattered areas of bowel wall thickening involving the terminal ileum and rectosigmoid colon suggesting an enteritis. Stomach and remaining bowel loops grossly unremarkable. Normal appendix. No mass, adenopathy, free air, or free fluid. BILATERAL inguinal hernias containing fat. Osseous demineralization without focal bony abnormality. IMPRESSION: Scattered areas of bowel wall thickening involving the distal colon and terminal ileum, raising question of enteritis such as from infection or inflammatory bowel disease, with ischemia considered less likely due to distribution. No evidence of bowel obstruction or perforation. Scattered mesenteric hazy infiltration and with scattered normal size mesenteric lymph nodes, could reflect fibrosing mesenteritis or less likely mesenteric adenitis. Chronic BILATERAL basilar pleural fluid collections  with calcifications unchanged since 2013. Question cirrhotic liver. Electronically Signed   By: Lavonia Dana M.D.   On: 01/14/2015 12:19      Assessment  12 yr M with h/o afib on chronic anticoagulation presented with acute gastroenteritis and CT findings are consistent with infectious vs ischemic etiology. He has improved  significantly since admission with IV hydration.  C.diff negative, Stool culture pending. Likely viral gastroenteritis  Plan  Agree with short course of Cipro and flagyl for 3-5 days Encourage PO intake, advance diet as tolerated Findings suggestive of cirrhosis on CT abd & pelvis (no h/o alcohol abuse); will check hepatitis panel, ferritin, ANA OK to discharge home if continues to do well with improvement in symptoms Will also arrange for office follow up visit and will need EGD for screening of esophageal varices Please call with any questions    K. Denzil Magnuson , [place] [phone] Mon-Fri 8a-5p [phone] after 5p, weekends, holidays [facility]

## 2015-01-15 NOTE — Progress Notes (Signed)
[facility] PROGRESS NOTE  Joshua [name] [id number] DOB: August 17, 1945 DOA: 01/14/2015 PCP: Redge Gainer, MD  brief narrative 69 year old male with history of CAD, hyperlipidemia, HOCM, COPD and A. fib on Coumadin presented to the ED with several episodes of diarrhea with bright red blood and vomiting on 11/18 associated with bloating, abdominal pain and syncope at home. Patient denied any head injury. They called EMS and was brought to the ED. An Route his blood pressure was 76/54 mmHg and was given IV fluids. Patient was alert and oriented when he arrived to the ED . He reported subjective fever earlier during the week with episode of diarrhea. In the ED he was found to have acute kidney injury with creatinine of 2.7 (normal at baseline), hypokalemia , and hemoglobin and had an elevated lactic acid of 6. He also had elevated troponin of 0.09 but without any chest pain symptoms or EKG changes. CT abdomen and pelvis done in the ED showed scattered areas of bowel wall thickening involving distal colon and terminal ileum suggestive of enteritis. Patient admitted to hospitalist service and GI consulted.  Assessment/Plan: Acute gastroenteritis with possible infectious colitis/enteritis On empiric Cipro and Flagyl. Continue aggressive IV hydration. Stool for C. difficile negative. GI pathogen panel pending. Symptoms improving. Much improved today. Advance diet to soft.  Appreciate GI evaluation. Recommends conservative management and empiric antibiotics for now and will defer discharge if clinically improved.  Lactic acidosis with acute kidney injury Secondary to severe dehydration with vomiting and diarrhea. Lactic acid improving with aggressive IV hydration. Renal function improving as well. Continue IV fluids and monitor.  elevated troponin Possibly associated with dehydration and acute kidney injury. EKG unremarkable. No chest pain symptoms. Subsequent troponin trending down.  Lower GI  bleed Appears to be secondary to infectious colitis/enteritis. H&H stable.  A. fib on Coumadin with supratherapeutic INR and lower GI bleed INR of 4.68 on admission. Received vitamin K. Coumadin on hold. INR of 1.5 this morning. If H&H remains stable in the morning no further GI bleed will resume Coumadin upon discharge.  ? UTI Asymptomatic. On empiric Cipro which will cover.  Hypokalemia Replenish  HOCM/aortic valve insufficiency Receive aggressive hydration upon admission. Currently on maintenance fluid. Monitor.  Essential hypertension Holding blood pressure medications given dehydration with syncope and low blood pressure.  Hyperlipidemia On statin which will be resumed on discharge  Diet: Soft DVT prophylaxis  Code Status: Full code Family Communication: None at bedside Disposition Plan: Home possibly tomorrow if symptoms continue to improve   Consultants:  [facility]  Procedures:  CT abdomen pelvis without contrast  Antibiotics:  Cipro and Flagyl  HPI/Subjective: Seen and examined. Denies any abdominal pain, nausea or vomiting. Had a few loose bowel movements overnight.  Objective: Filed Vitals:   01/15/15 0911  BP: 110/52  Pulse: 76  Temp: 98.3 F (36.8 C)  Resp:     Intake/Output Summary (Last 24 hours) at 01/15/15 1131 Last data filed at 01/15/15 0959  Gross per 24 hour  Intake    925 ml  Output      0 ml  Net    925 ml   Filed Weights   01/15/15 0500 01/15/15 0911  Weight: 94.2 kg (207 lb 10.8 oz) 94.2 kg (207 lb 10.8 oz)    Exam:   General:  Elderly male not in distress  HEENT: No pallor, moist oral mucosa  Chest: Clear to auscultation bilaterally  CVS: Normal S1 and S2, no murmurs rub or gallop  GI: Soft, nondistended, nontender, bowel sounds present   musculoskeletal: Warm, no edema  CNS: Alert and oriented    Data Reviewed: Basic Metabolic Panel:  Recent Labs Lab 01/13/15 1728 01/14/15 0851 01/14/15 0908  01/14/15 1221 01/14/15 1546 01/15/15 0500  NA 139 138 141 140  --  136  K 3.6 3.6 3.2* 2.9*  --  3.8  CL 94* 100* 100* 102  --  104  CO2 31* 22  --   --   --  26  GLUCOSE 89 126* 123* 132*  --  115*  BUN 21 37* 44* 34*  --  32*  CREATININE 1.08 2.81* 2.70* 2.20*  --  1.54*  CALCIUM 8.2* 7.9*  --   --   --  6.9*  MG  --   --   --   --  2.1  --    Liver Function Tests:  Recent Labs Lab 01/13/15 1728 01/14/15 0851  AST 24 38  ALT 22 21  ALKPHOS 79 61  BILITOT 0.5 1.1  PROT 6.2 5.1*  ALBUMIN 3.1* 2.2*   No results for input(s): LIPASE, AMYLASE in the last 168 hours. No results for input(s): AMMONIA in the last 168 hours. CBC:  Recent Labs Lab 01/13/15 1728  01/14/15 0851 01/14/15 0908 01/14/15 1221 01/14/15 1329 01/14/15 2141 01/15/15 0500  WBC 8.4  --  11.5*  --   --  9.0 10.0 8.1  NEUTROABS 6.0  --   --   --   --   --   --   --   HGB  --   < > 18.8* 20.7* 18.4* 17.0 16.0 14.5  HCT 47.9  < > 55.3* 61.0* 54.0* 51.2 47.2 43.0  MCV  --   --  94.9  --   --  96.1 94.4 94.7  PLT  --   --  146*  --   --  128* 144* 120*  < > = values in this interval not displayed. Cardiac Enzymes:  Recent Labs Lab 01/14/15 1546  TROPONINI 0.09*   BNP (last 3 results) No results for input(s): BNP in the last 8760 hours.  ProBNP (last 3 results) No results for input(s): PROBNP in the last 8760 hours.  CBG: No results for input(s): GLUCAP in the last 168 hours.  Recent Results (from the past 240 hour(s))  C difficile quick scan w PCR reflex     Status: None   Collection Time: 01/14/15  4:52 PM  Result Value Ref Range Status   C Diff antigen NEGATIVE NEGATIVE Final   C Diff toxin NEGATIVE NEGATIVE Final   C Diff interpretation Negative for toxigenic C. difficile  Final     Studies: Ct Abdomen Pelvis Wo Contrast  01/14/2015  CLINICAL DATA:  Bright red bloody diarrhea since last night, syncope, nausea and vomiting since yesterday ; coronary artery disease, hiatal hernia,  COPD, hypertension, atrial fibrillation EXAM: CT ABDOMEN AND PELVIS WITHOUT CONTRAST TECHNIQUE: Multidetector CT imaging of the abdomen and pelvis was performed following the standard protocol without IV contrast. Sagittal and coronal MPR images reconstructed from axial data set. Patient drank dilute oral contrast for exam. COMPARISON:  10/18/2011 FINDINGS: Pleural calcification and chronic pleural collections at both lung bases stable. Mitral annular calcification. Bibasilar atelectasis greater on RIGHT. Liver margins appear slightly nodular raising question of cirrhosis. No other focal abnormalities of the liver, gallbladder, spleen, pancreas, kidneys, or adrenals. Mild scattered atherosclerotic calcifications. Mild infiltrative changes of the mesentery with associated normal size  mesenteric nodes, nonspecific, question fibrosing mesenteritis. Scattered areas of bowel wall thickening involving the terminal ileum and rectosigmoid colon suggesting an enteritis. Stomach and remaining bowel loops grossly unremarkable. Normal appendix. No mass, adenopathy, free air, or free fluid. BILATERAL inguinal hernias containing fat. Osseous demineralization without focal bony abnormality. IMPRESSION: Scattered areas of bowel wall thickening involving the distal colon and terminal ileum, raising question of enteritis such as from infection or inflammatory bowel disease, with ischemia considered less likely due to distribution. No evidence of bowel obstruction or perforation. Scattered mesenteric hazy infiltration and with scattered normal size mesenteric lymph nodes, could reflect fibrosing mesenteritis or less likely mesenteric adenitis. Chronic BILATERAL basilar pleural fluid collections with calcifications unchanged since 2013. Question cirrhotic liver. Electronically Signed   By: Lavonia Dana M.D.   On: 01/14/2015 12:19    Scheduled Meds: . ciprofloxacin  400 mg Intravenous Q12H  . disopyramide  300 mg Oral Q12H  .  fluticasone  1 spray Each Nare Daily  . metronidazole  500 mg Intravenous Q8H  . pantoprazole  40 mg Oral Daily  . polyethylene glycol  17 g Oral BID  . sodium chloride  3 mL Intravenous Q12H   Continuous Infusions: . sodium chloride 125 mL/hr at 01/15/15 0849  . sodium chloride 0.9 % 1,000 mL with potassium chloride 40 mEq infusion 100 mL/hr at 01/15/15 0500     Time spent: 25 minutes    Ahren Pettinger, [place]  [facility] Pager [phone]. If 7PM-7AM, please contact night-coverage at [email or web address], password [facility] 01/15/2015, 11:31 AM  LOS: 1 day

## 2015-01-15 NOTE — Evaluation (Signed)
Physical Therapy Evaluation Patient Details Name: Joshua [name] MRN: [id number] DOB: [birth year] Today's Date: 01/15/2015   History of Present Illness  69 yo male with onset of UTI causing elevated lactic acid, acute gastroenteritis, colitis, PAF, L BBB.  Clinical Impression  Pt was seen for evaluation of mobility and is still being treated for mult medical issues and has history of fall.  Will continue to see him for therapy and have HHPT continue on when he leaves unless this is no longer indicated.  Steps will be next visit challenge as he is going home, not done this visit due to enteric precautions.    Follow Up Recommendations Home health PT    Equipment Recommendations  Other (comment) (will assess stairs to recommend)    Recommendations for Other Services       Precautions / Restrictions Precautions Precautions: Fall (telemetry) Restrictions Weight Bearing Restrictions: No      Mobility  Bed Mobility               General bed mobility comments: up when PT entered  Transfers Overall transfer level: Modified independent Equipment used: 1 person hand held assist             General transfer comment: reminded about transfer safety  Ambulation/Gait Ambulation/Gait assistance: Min guard (for safety) Ambulation Distance (Feet): 200 Feet Assistive device: 1 person hand held assist (IV pole) Gait Pattern/deviations: Step-through pattern;Narrow base of support;Trunk flexed Gait velocity: reduced Gait velocity interpretation: Below normal speed for age/gender General Gait Details: pt is fairly steady but has poor ankle ROM and low endurance  Stairs Stairs:  (has long flight to enter house)          Wheelchair Mobility    Modified Rankin (Stroke Patients Only)       Balance Overall balance assessment: Modified Independent;Needs assistance (short level trips are fine, has stairs with some concerns ) Sitting-balance support: Feet supported Sitting  balance-Leahy Scale: Good     Standing balance support: No upper extremity supported Standing balance-Leahy Scale: Fair                               Pertinent Vitals/Pain Pain Assessment: No/denies pain    Home Living Family/patient expects to be discharged to:: Private residence Living Arrangements: Children Available Help at Discharge: Available PRN/intermittently Type of Home: House Home Access: Stairs to enter Entrance Stairs-Rails: Can reach both;Left;Right Entrance Stairs-Number of Steps: 11 Home Layout: One level Home Equipment: None      Prior Function Level of Independence: Independent               Hand Dominance        Extremity/Trunk Assessment   Upper Extremity Assessment: Overall WFL for tasks assessed           Lower Extremity Assessment: Overall WFL for tasks assessed      Cervical / Trunk Assessment: Normal  Communication   Communication: No difficulties  Cognition Arousal/Alertness: Awake/alert Behavior During Therapy: WFL for tasks assessed/performed Overall Cognitive Status: Within Functional Limits for tasks assessed                      General Comments General comments (skin integrity, edema, etc.): Pt has to climb a long flight on one house entrance and should practice this before leaving    Exercises        Assessment/Plan    PT Assessment Patient  needs continued PT services  PT Diagnosis Abnormality of gait   PT Problem List Decreased strength;Decreased range of motion;Decreased activity tolerance;Decreased balance;Decreased mobility;Decreased coordination;Decreased safety awareness;Cardiopulmonary status limiting activity;Decreased skin integrity  PT Treatment Interventions DME instruction;Gait training;Stair training;Functional mobility training;Therapeutic activities;Therapeutic exercise;Balance training;Neuromuscular re-education;Patient/family education   PT Goals (Current goals can be found in  the Care Plan section) Acute Rehab PT Goals Patient Stated Goal: to be home safe PT Goal Formulation: With patient Time For Goal Achievement: 01/29/15 Potential to Achieve Goals: Good    Frequency Min 3X/week   Barriers to discharge Decreased caregiver support home alone the majority of the day    Co-evaluation               End of Session Equipment Utilized During Treatment: Gait belt Activity Tolerance: Patient tolerated treatment well;Patient limited by fatigue Patient left: in chair;with call bell/phone within reach Nurse Communication: Mobility status         Time: [id number] PT Time Calculation (min) (ACUTE ONLY): 31 min   Charges:   PT Evaluation $Initial PT Evaluation Tier I: 1 Procedure PT Treatments $Gait Training: 8-22 mins   PT G CodesRamond Dial 01/17/2015, 1:49 PM   Mee Hives, PT MS Acute Rehab Dept. Number: [phone] [id number] and [place] [phone]

## 2015-01-16 DIAGNOSIS — R55 Syncope and collapse: Secondary | ICD-10-CM

## 2015-01-16 DIAGNOSIS — E860 Dehydration: Secondary | ICD-10-CM

## 2015-01-16 DIAGNOSIS — I480 Paroxysmal atrial fibrillation: Secondary | ICD-10-CM

## 2015-01-16 DIAGNOSIS — R197 Diarrhea, unspecified: Secondary | ICD-10-CM | POA: Insufficient documentation

## 2015-01-16 DIAGNOSIS — N390 Urinary tract infection, site not specified: Secondary | ICD-10-CM

## 2015-01-16 DIAGNOSIS — K7460 Unspecified cirrhosis of liver: Secondary | ICD-10-CM | POA: Insufficient documentation

## 2015-01-16 DIAGNOSIS — R791 Abnormal coagulation profile: Secondary | ICD-10-CM

## 2015-01-16 DIAGNOSIS — I9589 Other hypotension: Secondary | ICD-10-CM

## 2015-01-16 DIAGNOSIS — E876 Hypokalemia: Secondary | ICD-10-CM

## 2015-01-16 LAB — HEPATITIS C ANTIBODY (REFLEX): HCV Ab: <0.1 s/co ratio (ref 0.0–0.9)

## 2015-01-16 LAB — CBC
HCT: 41.6 % (ref 39.0–52.0)
Hemoglobin: 13.6 g/dL (ref 13.0–17.0)
MCH: 31.4 pg (ref 26.0–34.0)
MCHC: 32.7 g/dL (ref 30.0–36.0)
MCV: 96.1 fL (ref 78.0–100.0)
Platelets: 102 10*3/uL — ABNORMAL LOW (ref 150–400)
RBC: 4.33 MIL/uL (ref 4.22–5.81)
RDW: 14 % (ref 11.5–15.5)
WBC: 6.6 10*3/uL (ref 4.0–10.5)

## 2015-01-16 LAB — HEPATITIS B SURFACE ANTIBODY, QUANTITATIVE: Hepatitis B-Post: <3.1 m[IU]/mL — ABNORMAL LOW (ref >9.9)

## 2015-01-16 LAB — BASIC METABOLIC PANEL
Anion gap: 3 — ABNORMAL LOW (ref 5–15)
BUN: 19 mg/dL (ref 6–20)
CO2: 28 mmol/L (ref 22–32)
Calcium: 7.1 mg/dL — ABNORMAL LOW (ref 8.9–10.3)
Chloride: 108 mmol/L (ref 101–111)
Creatinine, Ser: 1.17 mg/dL (ref 0.61–1.24)
GFR calc Af Amer: >60 mL/min (ref >60)
GFR calc non Af Amer: >60 mL/min (ref >60)
Glucose, Bld: 83 mg/dL (ref 65–99)
Potassium: 3.4 mmol/L — ABNORMAL LOW (ref 3.5–5.1)
Sodium: 139 mmol/L (ref 135–145)

## 2015-01-16 LAB — HEPATITIS B SURFACE ANTIGEN: Hepatitis B Surface Ag: NEGATIVE

## 2015-01-16 LAB — HEPATITIS A ANTIBODY, TOTAL: Hep A Total Ab: NEGATIVE

## 2015-01-16 LAB — HCV COMMENT:

## 2015-01-16 MED ORDER — METRONIDAZOLE 500 MG PO TABS: 500.0000 mg | ORAL_TABLET | Freq: Three times a day (TID) | ORAL | Status: AC

## 2015-01-16 MED ORDER — CIPROFLOXACIN HCL 500 MG PO TABS: 500.0000 mg | ORAL_TABLET | Freq: Two times a day (BID) | ORAL | Status: DC

## 2015-01-16 MED ORDER — POTASSIUM CHLORIDE CRYS ER 20 MEQ PO TBCR
40.0000 meq | EXTENDED_RELEASE_TABLET | Freq: Once | ORAL | Status: AC
  Administered 2015-01-16: 40 meq via ORAL
  Filled 2015-01-16: qty 2

## 2015-01-16 NOTE — Discharge Summary (Signed)
Physician Discharge Summary  Joshua [name] [id number] DOB: Jun 30, 1945 DOA: 01/14/2015  PCP: Redge Gainer, MD  Admit date: 01/14/2015 Discharge date: 01/16/2015  Time spent: >30 minutes  Recommendations for Outpatient Follow-up:  1. Discharge home with home health PT. Follow-up with PCP in one week. Patient is complete antibiotic course on 11/24. 2. Outpatient follow-up with GI.   Discharge Diagnoses:  Principal Problem:   Acute gastroenteritis   Active Problems:     Dehydration   Arterial hypotension   Rectal bleeding   Coronary atherosclerosis   Hypertrophic obstructive cardiomyopathy (HCC)   PAF (paroxysmal atrial fibrillation) (HCC)   Aortic valve insufficiency   Hyperlipidemia LDL goal <70   Thrombocytopenia (HCC)   Acute kidney injury ([place])   Syncope and collapse   Hypokalemia, gastrointestinal losses   Lactic acidosis   UTI (lower urinary tract infection)   Elevated INR   ? Cirrhosis of liver    Discharge Condition:fair  Diet recommendation: soft  Filed Weights   01/15/15 0500 01/15/15 0911 01/16/15 0533  Weight: 94.2 kg (207 lb 10.8 oz) 94.2 kg (207 lb 10.8 oz) 94.3 kg (207 lb 14.3 oz)    History of present illness:  69 year old male with history of CAD, hyperlipidemia, HOCM, COPD and A. fib on Coumadin presented to the ED with several episodes of diarrhea with bright red blood and vomiting on 11/18 associated with bloating, abdominal pain and syncope at home. Patient denied any head injury. They called EMS and was brought to the ED. An Route his blood pressure was 76/54 mmHg and was given IV fluids. Patient was alert and oriented when he arrived to the ED . He reported subjective fever earlier during the week with episode of diarrhea. In the ED he was found to have acute kidney injury with creatinine of 2.7 (normal at baseline), hypokalemia , and hemoglobin and had an elevated lactic acid of 6. He also had elevated troponin of 0.09 but without any chest  pain symptoms or EKG changes. CT abdomen and pelvis done in the ED showed scattered areas of bowel wall thickening involving distal colon and terminal ileum suggestive of enteritis. Patient admitted to hospitalist service and GI consulted.  Hospital Course:  Acute gastroenteritis with possible infectious colitis/enteritis Placed On empiric Cipro and Flagyl.  aggressive IV hydration. Stool for C. difficile negative. GI pathogen panel pending. -Symptoms much improved. Possibly gastroenteritis due to norovirus infection. Will discharge home on oral ciprofloxacin and Flagyl for 5 more days (will complete total seven-day course of antibiotic). -appreciate GI evaluation. [place] for discharge with outpt follow up.  Lactic acidosis with acute kidney injury Secondary to severe dehydration with vomiting and diarrhea. Renal function and lactic acid normalized with aggressive hydration.   elevated troponin Possibly associated with dehydration and acute kidney injury. EKG unremarkable. No chest pain symptoms. Subsequent troponin trending down.  Lower GI bleed Appears to be secondary to infectious colitis/enteritis. H&H stable. No further symptoms. Coumadin resumed upon discharge.  A. fib on Coumadin with supratherapeutic INR and lower GI bleed INR of 4.68 on admission. Received vitamin K. Coumadin just on hold. No further GI bleed. Resume Coumadin upon discharge.   ? UTI Asymptomatic. On empiric Cipro which will cover.  Hypokalemia Replenished  HOCM/aortic valve insufficiency Receive aggressive hydration upon admission. Noted for some leg swellings. Not in any respiratory distress. Fluid discontinued. She'll results in 1-2 days.  Essential hypertension Occasions has on admission due to dehydration and symptoms repeated resumed upon discharge.  Hyperlipidemia Resume statin.  ?  changes of liver cirrhosis seen on CT abdomen denies any alcohol use. LFTs normal. Hepatitis panel negative. ANA sent. GI  plan on outpatient follow-up.   Thrombocytopenia Appears to be chronic (low 100s). Follow-up as outpatient      Code Status: Full code Family Communication: None at bedside Disposition Plan: Home with HHPT   Consultants:  [facility]  Procedures:  CT abdomen pelvis without contrast  Antibiotics:  Cipro and Flagyl    Discharge Exam: Filed Vitals:   01/16/15 0533  BP: 117/55  Pulse: 71  Temp: 98.2 F (36.8 C)  Resp: 15    General: Elderly male not in distress HEENT: No pallor, moist oral mucosa, supple Chest: Clear to auscultation bilaterally CVS: Normal S1 and S2, no murmurs rub or gallop GI: Soft, nondistended, nontender, bowel sounds present Musculoskeletal: Warm, no edema CNS: Alert and oriented  Discharge Instructions    Current Discharge Medication List    START taking these medications   Details  ciprofloxacin (CIPRO) 500 MG tablet Take 1 tablet (500 mg total) by mouth 2 (two) times daily. Qty: 10 tablet, Refills: 0    metroNIDAZOLE (FLAGYL) 500 MG tablet Take 1 tablet (500 mg total) by mouth 3 (three) times daily. Qty: 15 tablet, Refills: 0      CONTINUE these medications which have NOT CHANGED   Details  acetaminophen (TYLENOL) 325 MG tablet Take 650 mg by mouth as directed.      Cholecalciferol (VITAMIN D3) 2000 UNITS TABS Take 1 tablet by mouth daily.     cyanocobalamin ([employer] VITAMIN B12) 1000 MCG tablet Take 1 tablet (1,000 mcg total) by mouth daily.    disopyramide (NORPACE) 150 MG capsule Take 2 capsules (300 mg total) by mouth 2 (two) times daily. Qty: 360 capsule, Refills: 3    hydrochlorothiazide (HYDRODIURIL) 25 MG tablet Take 1 tablet (25 mg total) by mouth daily. Qty: 90 tablet, Refills: 3    mometasone (NASONEX) 50 MCG/ACT nasal spray Place 2 sprays into the nose daily. Qty: 51 g, Refills: 3    omeprazole (PRILOSEC) 10 MG capsule Take 1 capsule (10 mg total) by mouth daily. Qty: 90 capsule, Refills: 3    rosuvastatin  (CRESTOR) 10 MG tablet Take 1 tablet (10 mg total) by mouth daily. Qty: 90 tablet, Refills: 3    verapamil (CALAN) 120 MG tablet Take 1 tablet (120 mg total) by mouth daily. Qty: 90 tablet, Refills: 3    !! warfarin (COUMADIN) 4 MG tablet Take 4 mg by mouth every Wednesday. Only on Wednesday    !! warfarin (COUMADIN) 5 MG tablet Take 5 mg by mouth daily. Patient takes 5 mg every day except on Wednesday     !! - Potential duplicate medications found. Please discuss with provider.    STOP taking these medications     polyethylene glycol powder (GLYCOLAX/MIRALAX) powder        Allergies  Allergen Reactions  . Tetracycline Nausea And Vomiting   Follow-up Information    Follow up with Redge Gainer, MD. Schedule an appointment as soon as possible for a visit in 1 week.   Specialty:  Family Medicine   Contact information:   [place] [address] [phone]       Follow up with Harl Bowie, MD. Call in 4 weeks.   Specialty:  Gastroenterology   Contact information:   [place] [id number] [phone]        The results of significant diagnostics from  this hospitalization (including imaging, microbiology, ancillary and laboratory) are listed below for reference.    Significant Diagnostic Studies: Ct Abdomen Pelvis Wo Contrast  01/14/2015  CLINICAL DATA:  Bright red bloody diarrhea since last night, syncope, nausea and vomiting since yesterday ; coronary artery disease, hiatal hernia, COPD, hypertension, atrial fibrillation EXAM: CT ABDOMEN AND PELVIS WITHOUT CONTRAST TECHNIQUE: Multidetector CT imaging of the abdomen and pelvis was performed following the standard protocol without IV contrast. Sagittal and coronal MPR images reconstructed from axial data set. Patient drank dilute oral contrast for exam. COMPARISON:  10/18/2011 FINDINGS: Pleural calcification and chronic pleural collections at both lung bases stable. Mitral annular  calcification. Bibasilar atelectasis greater on RIGHT. Liver margins appear slightly nodular raising question of cirrhosis. No other focal abnormalities of the liver, gallbladder, spleen, pancreas, kidneys, or adrenals. Mild scattered atherosclerotic calcifications. Mild infiltrative changes of the mesentery with associated normal size mesenteric nodes, nonspecific, question fibrosing mesenteritis. Scattered areas of bowel wall thickening involving the terminal ileum and rectosigmoid colon suggesting an enteritis. Stomach and remaining bowel loops grossly unremarkable. Normal appendix. No mass, adenopathy, free air, or free fluid. BILATERAL inguinal hernias containing fat. Osseous demineralization without focal bony abnormality. IMPRESSION: Scattered areas of bowel wall thickening involving the distal colon and terminal ileum, raising question of enteritis such as from infection or inflammatory bowel disease, with ischemia considered less likely due to distribution. No evidence of bowel obstruction or perforation. Scattered mesenteric hazy infiltration and with scattered normal size mesenteric lymph nodes, could reflect fibrosing mesenteritis or less likely mesenteric adenitis. Chronic BILATERAL basilar pleural fluid collections with calcifications unchanged since 2013. Question cirrhotic liver. Electronically Signed   By: Lavonia Dana M.D.   On: 01/14/2015 12:19    Microbiology: Recent Results (from the past 240 hour(s))  Urine culture     Status: None   Collection Time: 01/14/15 11:12 AM  Result Value Ref Range Status   Specimen Description URINE, CLEAN CATCH  Final   Special Requests NONE  Final   Culture MULTIPLE SPECIES PRESENT, SUGGEST RECOLLECTION  Final   Report Status 01/15/2015 FINAL  Final  C difficile quick scan w PCR reflex     Status: None   Collection Time: 01/14/15  4:52 PM  Result Value Ref Range Status   C Diff antigen NEGATIVE NEGATIVE Final   C Diff toxin NEGATIVE NEGATIVE Final    C Diff interpretation Negative for toxigenic C. difficile  Final     Labs: Basic Metabolic Panel:  Recent Labs Lab 01/13/15 1728 01/14/15 0851 01/14/15 0908 01/14/15 1221 01/14/15 1546 01/15/15 0500 01/16/15 0312  NA 139 138 141 140  --  136 139  K 3.6 3.6 3.2* 2.9*  --  3.8 3.4*  CL 94* 100* 100* 102  --  104 108  CO2 31* 22  --   --   --  26 28  GLUCOSE 89 126* 123* 132*  --  115* 83  BUN 21 37* 44* 34*  --  32* 19  CREATININE 1.08 2.81* 2.70* 2.20*  --  1.54* 1.17  CALCIUM 8.2* 7.9*  --   --   --  6.9* 7.1*  MG  --   --   --   --  2.1  --   --    Liver Function Tests:  Recent Labs Lab 01/13/15 1728 01/14/15 0851  AST 24 38  ALT 22 21  ALKPHOS 79 61  BILITOT 0.5 1.1  PROT 6.2 5.1*  ALBUMIN 3.1* 2.2*  No results for input(s): LIPASE, AMYLASE in the last 168 hours. No results for input(s): AMMONIA in the last 168 hours. CBC:  Recent Labs Lab 01/13/15 1728 01/14/15 0851  01/14/15 1221 01/14/15 1329 01/14/15 2141 01/15/15 0500 01/16/15 0312  WBC 8.4 11.5*  --   --  9.0 10.0 8.1 6.6  NEUTROABS 6.0  --   --   --   --   --   --   --   HGB  --  18.8*  < > 18.4* 17.0 16.0 14.5 13.6  HCT 47.9 55.3*  < > 54.0* 51.2 47.2 43.0 41.6  MCV  --  94.9  --   --  96.1 94.4 94.7 96.1  PLT  --  146*  --   --  128* 144* 120* 102*  < > = values in this interval not displayed. Cardiac Enzymes:  Recent Labs Lab 01/14/15 1546  TROPONINI 0.09*   BNP: BNP (last 3 results) No results for input(s): BNP in the last 8760 hours.  ProBNP (last 3 results) No results for input(s): PROBNP in the last 8760 hours.  CBG: No results for input(s): GLUCAP in the last 168 hours.     SignedLouellen Molder  [facility] 01/16/2015, 8:43 AM

## 2015-01-16 NOTE — Discharge Instructions (Signed)
Norovirus Infection °A norovirus infection is caused by exposure to a virus in a group of similar viruses (noroviruses). This type of infection causes inflammation in your stomach and intestines (gastroenteritis). Norovirus is the most common cause of gastroenteritis. It also causes food poisoning. °Anyone can get a norovirus infection. It spreads very easily (contagious). You can get it from contaminated food, water, surfaces, or other people. Norovirus is found in the stool or vomit of infected people. You can spread the infection as soon as you feel sick until 2 weeks after you recover.  °Symptoms usually begin within 2 days after you become infected. Most norovirus symptoms affect the digestive system. °CAUSES °Norovirus infection is caused by contact with norovirus. You can catch norovirus if you: °· Eat or drink something contaminated with norovirus. °· Touch surfaces or objects contaminated with norovirus and then put your hand in your mouth. °· Have direct contact with an infected person who has symptoms. °· Share food, drink, or utensils with someone with who is sick with norovirus. °SIGNS AND SYMPTOMS °Symptoms of norovirus may include: °· Nausea. °· Vomiting. °· Diarrhea. °· Stomach cramps. °· Fever. °· Chills. °· Headache. °· Muscle aches. °· Tiredness. °DIAGNOSIS °Your health care provider may suspect norovirus based on your symptoms and physical exam. Your health care provider may also test a sample of your stool or vomit for the virus.  °TREATMENT °There is no specific treatment for norovirus. Most people get better without treatment in about 2 days. °HOME CARE INSTRUCTIONS °· Replace lost fluids by drinking plenty of water or rehydration fluids containing important minerals called electrolytes. This prevents dehydration. Drink enough fluid to keep your urine clear or pale yellow. °· Do not prepare food for others while you are infected. Wait at least 3 days after recovering from the illness to do  that. °PREVENTION  °· Wash your hands often, especially after using the toilet or changing a diaper. °· Wash fruits and vegetables thoroughly before preparing or serving them. °· Throw out any food that a sick person may have touched. °· Disinfect contaminated surfaces immediately after someone in the household has been sick. Use a bleach-based household cleaner. °· Immediately remove and wash soiled clothes or sheets. °SEEK MEDICAL CARE IF: °· Your vomiting, diarrhea, and stomach pain is getting worse. °· Your symptoms of norovirus do not go away after 2-3 days. °SEEK IMMEDIATE MEDICAL CARE IF:  °You develop symptoms of dehydration that do not improve with fluid replacement. This may include: °· Excessive sleepiness. °· Lack of tears. °· Dry mouth. °· Dizziness when standing. °· Weak pulse. °  °This information is not intended to replace advice given to you by your health care provider. Make sure you discuss any questions you have with your health care provider. °  °Document Released: 05/05/2002 Document Revised: 03/05/2014 Document Reviewed: 07/23/2013 °Elsevier Interactive Patient Education ©2016 [employer] Inc[employer] ° °

## 2015-01-16 NOTE — Progress Notes (Signed)
[facility] ROUNDING NOTE   Subjective: Feels better, Cr and BUN improved. No further episodes of vomiting or diarrhea.   Objective: Vital signs in last 24 hours: Temp:  [97.8 F (36.6 C)-98.2 F (36.8 C)] 98.2 F (36.8 C) (11/20 0533) Pulse Rate:  [69-71] 71 (11/20 0533) Resp:  [15-16] 15 (11/20 0533) BP: (111-117)/(55-56) 117/55 mmHg (11/20 0533) SpO2:  [95 %-97 %] 95 % (11/20 0533) Weight:  [207 lb 14.3 oz (94.3 kg)] 207 lb 14.3 oz (94.3 kg) (11/20 0533) Last BM Date: 01/15/15 General: NAD Lungs:b/l clear Heart:s1s2 Abdomen: soft ntnd, BS+ Ext: trace edema    Intake/Output from previous day: 11/19 0701 - 11/20 0700 In: 1755.4 [P.O.:240; I.V.:1115.4; IV Piggyback:400] Out: 725 [Urine:725] Intake/Output this shift:     Lab Results:  Recent Labs  01/14/15 2141 01/15/15 0500 01/16/15 0312  WBC 10.0 8.1 6.6  HGB 16.0 14.5 13.6  PLT 144* 120* 102*  MCV 94.4 94.7 96.1   BMET  Recent Labs  01/14/15 0851  01/14/15 1221 01/15/15 0500 01/16/15 0312  NA 138  < > 140 136 139  K 3.6  < > 2.9* 3.8 3.4*  CL 100*  < > 102 104 108  CO2 22  --   --  26 28  GLUCOSE 126*  < > 132* 115* 83  BUN 37*  < > 34* 32* 19  CREATININE 2.81*  < > 2.20* 1.54* 1.17  CALCIUM 7.9*  --   --  6.9* 7.1*  < > = values in this interval not displayed. LFT  Recent Labs  01/13/15 1728 01/14/15 0851  PROT 6.2 5.1*  ALBUMIN 3.1* 2.2*  AST 24 38  ALT 22 21  ALKPHOS 79 61  BILITOT 0.5 1.1   PT/INR  Recent Labs  01/14/15 0851 01/15/15 0500  INR 4.68* 1.54*      Imaging/Other results: Ct Abdomen Pelvis Wo Contrast  01/14/2015  CLINICAL DATA:  Bright red bloody diarrhea since last night, syncope, nausea and vomiting since yesterday ; coronary artery disease, hiatal hernia, COPD, hypertension, atrial fibrillation EXAM: CT ABDOMEN AND PELVIS WITHOUT CONTRAST TECHNIQUE: Multidetector CT imaging of the abdomen and pelvis was performed following the standard  protocol without IV contrast. Sagittal and coronal MPR images reconstructed from axial data set. Patient drank dilute oral contrast for exam. COMPARISON:  10/18/2011 FINDINGS: Pleural calcification and chronic pleural collections at both lung bases stable. Mitral annular calcification. Bibasilar atelectasis greater on RIGHT. Liver margins appear slightly nodular raising question of cirrhosis. No other focal abnormalities of the liver, gallbladder, spleen, pancreas, kidneys, or adrenals. Mild scattered atherosclerotic calcifications. Mild infiltrative changes of the mesentery with associated normal size mesenteric nodes, nonspecific, question fibrosing mesenteritis. Scattered areas of bowel wall thickening involving the terminal ileum and rectosigmoid colon suggesting an enteritis. Stomach and remaining bowel loops grossly unremarkable. Normal appendix. No mass, adenopathy, free air, or free fluid. BILATERAL inguinal hernias containing fat. Osseous demineralization without focal bony abnormality. IMPRESSION: Scattered areas of bowel wall thickening involving the distal colon and terminal ileum, raising question of enteritis such as from infection or inflammatory bowel disease, with ischemia considered less likely due to distribution. No evidence of bowel obstruction or perforation. Scattered mesenteric hazy infiltration and with scattered normal size mesenteric lymph nodes, could reflect fibrosing mesenteritis or less likely mesenteric adenitis. Chronic BILATERAL basilar pleural fluid collections with calcifications unchanged since 2013. Question cirrhotic liver. Electronically Signed   By: Lavonia Dana M.D.   On: 01/14/2015 12:19  Assessment  39 yr M with h/o afib on chronic anticoagulation presented with acute gastroenteritis and CT findings are consistent with infectious vs ischemic etiology. He has improved significantly since admission with IV hydration.  C.diff negative, Stool culture pending. Likely  viral gastroenteritis  Plan Discharge home today Cipro and flagyl for total 5 days course Diet as tolerated Findings suggestive of cirrhosis on CT abd & pelvis (no h/o alcohol abuse); Viral hepatitis negative, ferritin normal range, ANA pending. No h/o alcohol abuse. ?NASH Will also arrange for office follow up visit and will need EGD for screening of esophageal varices Please call with any questions   K. Denzil Magnuson , MD [phone] Mon-Fri 8a-5p [phone] after 5p, weekends, holidays [facility]

## 2015-01-16 NOTE — Progress Notes (Signed)
01/16/15 Patient to be discharged home today, IV sites removed and discharge instructions reviewed with patient.

## 2015-01-16 NOTE — Care Management Note (Signed)
Case Management Note  Patient Details  Name: Joshua [name] MRN: [id number] Date of Birth: 1945-06-19  Subjective/Objective:                  N/V/D w blood Action/Plan: Discharge planning Expected Discharge Date:  01/16/15              Expected Discharge Plan:  [place]  In-House Referral:     Discharge planning Services  CM Consult  Post Acute Care Choice:  Home Health Choice offered to:  Patient  DME Arranged:    DME Agency:  NA  HH Arranged:  Patient Refused [facility] Agency:     Status of Service:  Completed, signed off  Medicare Important Message Given:    Date Medicare IM Given:    Medicare IM give by:    Date Additional Medicare IM Given:    Additional Medicare Important Message give by:     If discussed at [place] of Stay Meetings, dates discussed:    Additional Comments: CM spoke with pt to offer choice for home health agency.  Pt politely refuses all [facility] services.  No other CM needs were communicated. iJeffries, Moss Mc, RN 01/16/2015, 10:08 AM

## 2015-01-17 ENCOUNTER — Ambulatory Visit (INDEPENDENT_AMBULATORY_CARE_PROVIDER_SITE_OTHER): Payer: Medicare Other | Admitting: Pharmacist

## 2015-01-17 ENCOUNTER — Telehealth: Payer: Self-pay | Admitting: *Deleted

## 2015-01-17 DIAGNOSIS — I351 Nonrheumatic aortic (valve) insufficiency: Secondary | ICD-10-CM | POA: Diagnosis not present

## 2015-01-17 DIAGNOSIS — I482 Chronic atrial fibrillation, unspecified: Secondary | ICD-10-CM

## 2015-01-17 DIAGNOSIS — I480 Paroxysmal atrial fibrillation: Secondary | ICD-10-CM | POA: Diagnosis not present

## 2015-01-17 LAB — ANTINUCLEAR ANTIBODIES, IFA: ANA Ab, IFA: NEGATIVE

## 2015-01-17 LAB — POCT INR: INR: 1.3

## 2015-01-17 NOTE — Patient Instructions (Signed)
Anticoagulation Dose Instructions as of 01/17/2015      Joshua [name] Tue Wed Thu Fri Sat   New Dose 4 mg 6 mg 4 mg 4 mg 4 mg 6 mg 4 mg    Description        Take 1 and 1/2 tablets today - Monday, November 21st.  Then take 1 tablet daily while taking metronidazole.  When finished start new warfarin dose of 1 and 1/2 tablets on mondays and fridays and 1 tablet all other days.       INR was 1.3 today

## 2015-01-17 NOTE — Telephone Encounter (Signed)
Patient has an appt scheduled for 01/21/15 with Dr Laurance Flatten for hospital follow up. Attempted to call patient but phone was busy. Will try again later today.   Spoke with patient at 2:30 pm on 01/17/15.  Date of Discharge:01/16/15  Facility: [facility]  Principal Discharge Diagnosis: acute gastroenteritis and lactic acidosis  Additional Diagnosis: (copied from hospital discharge) Acute gastroenteritis with possible infectious colitis/enteritis Placed On empiric Cipro and Flagyl. aggressive IV hydration. Stool for C. difficile negative. GI pathogen panel pending. -Symptoms much improved. Possibly gastroenteritis due to norovirus infection. Will discharge home on oral ciprofloxacin and Flagyl for 5 more days (will complete total seven-day course of antibiotic). -appreciate GI evaluation. [place] for discharge with outpt follow up.  Lactic acidosis with acute kidney injury Secondary to severe dehydration with vomiting and diarrhea. Renal function and lactic acid normalized with aggressive hydration.   elevated troponin Possibly associated with dehydration and acute kidney injury. EKG unremarkable. No chest pain symptoms. Subsequent troponin trending down.  Lower GI bleed Appears to be secondary to infectious colitis/enteritis. H&H stable. No further symptoms. Coumadin resumed upon discharge.  A. fib on Coumadin with supratherapeutic INR and lower GI bleed INR of 4.68 on admission. Received vitamin K. Coumadin just on hold. No further GI bleed. Resume Coumadin upon discharge.  ? UTI Asymptomatic. On empiric Cipro which will cover.  Hypokalemia Replenished  HOCM/aortic valve insufficiency Receive aggressive hydration upon admission. Noted for some leg swellings. Not in any respiratory distress. Fluid discontinued. She'll results in 1-2 days.  Essential hypertension Occasions has on admission due to dehydration and symptoms repeated resumed upon discharge.  Hyperlipidemia Resume  statin.  ?changes of liver cirrhosis seen on CT abdomen denies any alcohol use. LFTs normal. Hepatitis panel negative. ANA sent. GI plan on outpatient follow-up.  Thrombocytopenia Appears to be chronic (low 100s). Follow-up as outpatient   Call Completed and Appointment Scheduled: Yes, Date: 01/21/15 10:45   Patient is knowledgeable of his/her condition(s) and/or treatment: Yes  Family and/or caregiver is knowledgeable of patient's condition (s) and/or treatment: yes  Patient is caring for self at home: Yes  Patient is receiving home health services: no   MEDICATION RECONCILIATION Medication list reviewed with patient: Yes  Patient is able to obtain needed medications: Yes  ACTIVITIES OF DAILY LIVING  Is the patient able to perform his/her own ADLs: Yes  PATIENT EDUCATION Topics Discussed: Patient is coming to see Cherre Robins, PharmD today to discuss his medications and the impact they may have on his protime. We also discussed his lower extremity edema. He is elevating his legs and the swelling is better today than yesterday. He will continue to monitor and let us know if they worsen or do not improve.   Chong Sicilian, RN

## 2015-01-19 LAB — GI PATHOGEN PANEL BY PCR, STOOL
C difficile toxin A/B: NOT DETECTED
Campylobacter by PCR: NOT DETECTED
Cryptosporidium by PCR: NOT DETECTED
E coli (ETEC) LT/ST: NOT DETECTED
E coli (STEC): NOT DETECTED
E coli 0157 by PCR: NOT DETECTED
G lamblia by PCR: NOT DETECTED
Norovirus GI/GII: NOT DETECTED
Rotavirus A by PCR: NOT DETECTED
Salmonella by PCR: NOT DETECTED
Shigella by PCR: NOT DETECTED

## 2015-01-21 ENCOUNTER — Encounter: Payer: Self-pay | Admitting: Family Medicine

## 2015-01-21 ENCOUNTER — Ambulatory Visit (INDEPENDENT_AMBULATORY_CARE_PROVIDER_SITE_OTHER): Payer: Medicare Other | Admitting: Family Medicine

## 2015-01-21 VITALS — BP 113/62 | HR 62 | Temp 97.2°F | Ht 68.0 in | Wt 201.0 lb

## 2015-01-21 DIAGNOSIS — Z09 Encounter for follow-up examination after completed treatment for conditions other than malignant neoplasm: Secondary | ICD-10-CM | POA: Diagnosis not present

## 2015-01-21 DIAGNOSIS — E872 Acidosis, unspecified: Secondary | ICD-10-CM

## 2015-01-21 DIAGNOSIS — I480 Paroxysmal atrial fibrillation: Secondary | ICD-10-CM | POA: Diagnosis not present

## 2015-01-21 DIAGNOSIS — R748 Abnormal levels of other serum enzymes: Secondary | ICD-10-CM

## 2015-01-21 DIAGNOSIS — I351 Nonrheumatic aortic (valve) insufficiency: Secondary | ICD-10-CM

## 2015-01-21 DIAGNOSIS — A084 Viral intestinal infection, unspecified: Secondary | ICD-10-CM

## 2015-01-21 LAB — POCT INR: INR: 1.8

## 2015-01-21 NOTE — Patient Instructions (Addendum)
The patient will take his Coumadin as directed He will follow-up with his pro time as planned unless his pro time is extremely all with today's visit He will gradually increase his activity level and continue drinking plenty of fluids He will wait for any extreme exertion for another 7-10 days. The patient will follow-up in one week with the clinical pharmacist for his next pro time He will take 6 mg of Coumadin today only and then will resume his regular Coumadin dosing starting tomorrow

## 2015-01-21 NOTE — Progress Notes (Signed)
Subjective:    Patient ID: Joshua [name], male    DOB: 01-20-1946, 69 y.o.   MRN: [id number]  HPI Patient here today for hospital follow up from  [facility] where he was admitted for Lactic Acidosis. The patient was discharged on 01/16/2015. His primary diagnoses was lactic acidosis. He had severe nausea vomiting and rectal bleeding. Stool cultures were returned and all of these were negative. When he was admitted to the hospital he had left upper quadrant pain along with the nausea and vomiting. The patient was told in the hospital that he had normal growth virus. He completed today his metronidazole and Cipro. He still having soft stools and no blood. He has a follow-up appointment with the gastroenterologist closer to Christmas time. He is not having any particular issues other than maybe some slight fatigue and diminished energy and some slight abdominal tenderness in general.      Patient Active Problem List   Diagnosis Date Noted  . Diarrhea   . Hepatic cirrhosis ([place])   . Rectal bleeding   . Acute gastroenteritis 01/14/2015  . Acute kidney injury ([place]) 01/14/2015  . Syncope and collapse 01/14/2015  . Hypokalemia, gastrointestinal losses 01/14/2015  . Lactic acidosis 01/14/2015  . UTI (lower urinary tract infection) 01/14/2015  . Lower GI bleeding 01/14/2015  . Elevated INR 01/14/2015  . Dehydration   . Arterial hypotension   . Erectile dysfunction 11/10/2014  . Thrombocytopenia ([place]) 01/30/2014  . Hyperlipidemia LDL goal <70 12/03/2013  . High risk medication use 05/21/2013  . BPH (benign prostatic hyperplasia) 01/29/2013  . History of hematuria 01/29/2013  . Family history of colon cancer 01/29/2013  . PAF (paroxysmal atrial fibrillation) ([place]) 12/17/2012  . Aortic valve insufficiency 12/17/2012  . Mild mitral stenosis 12/17/2012  . Pulmonary infiltrates 07/17/2010  . COPD UNSPECIFIED 05/05/2010  . Hypertrophic obstructive cardiomyopathy ([place]) 03/31/2010  . ATRIAL FLUTTER  03/31/2010  . HEMOCCULT POSITIVE STOOL 10/12/2008  . B12 deficiency 10/11/2008  . HYPERCHOLESTEROLEMIA 10/11/2008  . ANEMIA, IRON DEFICIENCY 10/11/2008  . Coronary atherosclerosis 10/11/2008  . FIBRILLATION, ATRIAL 10/11/2008  . GASTRITIS 10/11/2008  . HIATAL HERNIA WITH REFLUX 10/11/2008  . COLONIC POLYPS, ADENOMATOUS, HX OF 10/11/2008   Outpatient Encounter Prescriptions as of 01/21/2015  Medication Sig  . acetaminophen (TYLENOL) 325 MG tablet Take 650 mg by mouth as directed.    . Cholecalciferol (VITAMIN D3) 2000 UNITS TABS Take 1 tablet by mouth daily.   . cyanocobalamin ([employer] VITAMIN B12) 1000 MCG tablet Take 1 tablet (1,000 mcg total) by mouth daily.  . disopyramide (NORPACE) 150 MG capsule Take 2 capsules (300 mg total) by mouth 2 (two) times daily.  . hydrochlorothiazide (HYDRODIURIL) 25 MG tablet Take 1 tablet (25 mg total) by mouth daily.  . mometasone (NASONEX) 50 MCG/ACT nasal spray Place 2 sprays into the nose daily. (Patient taking differently: Place 2 sprays into the nose daily as needed. )  . omeprazole (PRILOSEC) 10 MG capsule Take 1 capsule (10 mg total) by mouth daily.  . rosuvastatin (CRESTOR) 10 MG tablet Take 1 tablet (10 mg total) by mouth daily.  . verapamil (CALAN) 120 MG tablet Take 1 tablet (120 mg total) by mouth daily. (Patient taking differently: Take 60 mg by mouth daily. )  . warfarin (COUMADIN) 4 MG tablet Take 4 mg by mouth every Wednesday. Only on Wednesday   No facility-administered encounter medications on file as of 01/21/2015.      Review of Systems  Constitutional: Negative.   HENT: Negative.  Eyes: Negative.   Respiratory: Negative.   Cardiovascular: Negative.   Gastrointestinal: Negative.   Endocrine: Negative.   Genitourinary: Negative.   Musculoskeletal: Negative.   Skin: Negative.   Allergic/Immunologic: Negative.   Neurological: Negative.   Hematological: Negative.   Psychiatric/Behavioral: Negative.        Objective:    Physical Exam  Constitutional: He is oriented to person, place, and time. He appears well-developed and well-nourished. No distress.  HENT:  Head: Normocephalic and atraumatic.  Eyes: Conjunctivae and EOM are normal. Pupils are equal, round, and reactive to light. Right eye exhibits no discharge. Left eye exhibits no discharge. No scleral icterus.  Neck: Normal range of motion. Neck supple. No thyromegaly present.  Cardiovascular: Normal rate, regular rhythm and normal heart sounds.   No murmur heard. The rhythm appeared regular today at 62/m  Pulmonary/Chest: Effort normal and breath sounds normal. No respiratory distress. He has no wheezes. He has no rales. He exhibits no tenderness.  Abdominal: Soft. Bowel sounds are normal. He exhibits no mass. There is tenderness. There is no rebound and no guarding.  The abdomen is generally tender without masses or organ enlargement or bruits  Genitourinary: Rectum normal.  Musculoskeletal: Normal range of motion. He exhibits edema. He exhibits no tenderness.  There is 2+ pretibial edema  Lymphadenopathy:    He has no cervical adenopathy.  Neurological: He is alert and oriented to person, place, and time. He has normal reflexes. No cranial nerve deficit.  Skin: Skin is warm and dry. No rash noted.  Psychiatric: He has a normal mood and affect. His behavior is normal. Judgment and thought content normal.  Nursing note and vitals reviewed.  BP 113/62 mmHg  Pulse 62  Temp(Src) 97.2 F (36.2 C) (Oral)  Ht _0  (1.727 m)  Wt 201 lb (91.173 kg)  BMI 30.57 kg/m2        Assessment & Plan:  1. Aortic valve insufficiency -Continue with Coumadin and regular follow-up with cardiology -The patient will take 6 mg of Coumadin today only and will resume his normal Coumadin dosing starting tomorrow. -He will follow-up with a ProTime in one week by the clinical pharmacist - POCT INR  2. PAF (paroxysmal atrial fibrillation) ([facility]) -The rhythm was  regular today at about 62/m -Repeat pro time as planned in a couple of weeks unless the number is off today and we need to repeat it sooner - POCT INR  3. Hospital discharge follow-up -The patient has completed his Cipro and metronidazole. -He is resuming his regular Coumadin treatment regimen - BMP8+EGFR - CBC with Differential/Platelet - Hepatic function panel  4. Viral gastroenteritis -This is better his stools are soft but not watery  5. Lactic acidosis -Lab work pending  Patient Instructions  The patient will take his Coumadin as directed He will follow-up with his pro time as planned unless his pro time is extremely all with today's visit He will gradually increase his activity level and continue drinking plenty of fluids He will wait for any extreme exertion for another 7-10 days.   Arrie Senate MD   .

## 2015-01-22 LAB — CBC WITH DIFFERENTIAL/PLATELET
Basophils Absolute: 0.1 10*3/uL (ref 0.0–0.2)
Basos: 1 %
EOS (ABSOLUTE): 0.6 10*3/uL — ABNORMAL HIGH (ref 0.0–0.4)
Eos: 9 %
Hematocrit: 46.2 % (ref 37.5–51.0)
Hemoglobin: 15.1 g/dL (ref 12.6–17.7)
Immature Grans (Abs): 0 10*3/uL (ref 0.0–0.1)
Immature Granulocytes: 1 %
Lymphocytes Absolute: 1.2 10*3/uL (ref 0.7–3.1)
Lymphs: 17 %
MCH: 31.1 pg (ref 26.6–33.0)
MCHC: 32.7 g/dL (ref 31.5–35.7)
MCV: 95 fL (ref 79–97)
Monocytes Absolute: 0.7 10*3/uL (ref 0.1–0.9)
Monocytes: 10 %
Neutrophils Absolute: 4.4 10*3/uL (ref 1.4–7.0)
Neutrophils: 62 %
Platelets: 164 10*3/uL (ref 150–379)
RBC: 4.86 x10E6/uL (ref 4.14–5.80)
RDW: 13.9 % (ref 12.3–15.4)
WBC: 7 10*3/uL (ref 3.4–10.8)

## 2015-01-22 LAB — HEPATIC FUNCTION PANEL
ALT: 47 IU/L — ABNORMAL HIGH (ref 0–44)
AST: 53 IU/L — ABNORMAL HIGH (ref 0–40)
Albumin: 3 g/dL — ABNORMAL LOW (ref 3.5–4.8)
Alkaline Phosphatase: 76 IU/L (ref 39–117)
Bilirubin Total: 0.2 mg/dL (ref 0.0–1.2)
Bilirubin, Direct: 0.1 mg/dL (ref 0.00–0.40)
Total Protein: 5.5 g/dL — ABNORMAL LOW (ref 6.0–8.5)

## 2015-01-22 LAB — BMP8+EGFR
BUN/Creatinine Ratio: 15 (ref 10–22)
BUN: 18 mg/dL (ref 8–27)
CO2: 31 mmol/L — ABNORMAL HIGH (ref 18–29)
Calcium: 8.6 mg/dL (ref 8.6–10.2)
Chloride: 98 mmol/L (ref 97–106)
Creatinine, Ser: 1.19 mg/dL (ref 0.76–1.27)
GFR calc Af Amer: 71 mL/min/{1.73_m2} (ref >59)
GFR calc non Af Amer: 62 mL/min/{1.73_m2} (ref >59)
Glucose: 70 mg/dL (ref 65–99)
Potassium: 4.6 mmol/L (ref 3.5–5.2)
Sodium: 143 mmol/L (ref 136–144)

## 2015-01-24 NOTE — Addendum Note (Signed)
Addended by: Thana Ates on: 01/24/2015 11:09 AM   Modules accepted: Orders

## 2015-02-01 ENCOUNTER — Ambulatory Visit (INDEPENDENT_AMBULATORY_CARE_PROVIDER_SITE_OTHER): Payer: Medicare Other | Admitting: Pharmacist Clinician (PhC)/ Clinical Pharmacy Specialist

## 2015-02-01 DIAGNOSIS — I351 Nonrheumatic aortic (valve) insufficiency: Secondary | ICD-10-CM | POA: Diagnosis not present

## 2015-02-01 DIAGNOSIS — I480 Paroxysmal atrial fibrillation: Secondary | ICD-10-CM | POA: Diagnosis not present

## 2015-02-01 DIAGNOSIS — I4891 Unspecified atrial fibrillation: Secondary | ICD-10-CM

## 2015-02-01 LAB — POCT INR: INR: 1.9

## 2015-02-01 NOTE — Patient Instructions (Signed)
Anticoagulation Dose Instructions as of 02/01/2015      Joshua [name] Tue Wed Thu Fri Sat   New Dose 4 mg 6 mg 4 mg 4 mg 4 mg 6 mg 4 mg    Description        Take 1 and 1/2 tablets today - Tuesday, December 6th.  Then take warfarin dose of 1 and 1/2 tablets on mondays and fridays and 1 tablet all other days.

## 2015-02-17 ENCOUNTER — Encounter: Payer: Self-pay | Admitting: Gastroenterology

## 2015-02-17 ENCOUNTER — Ambulatory Visit (INDEPENDENT_AMBULATORY_CARE_PROVIDER_SITE_OTHER): Payer: Medicare Other | Admitting: Gastroenterology

## 2015-02-17 VITALS — BP 126/60 | HR 60 | Ht 67.25 in | Wt 197.5 lb

## 2015-02-17 DIAGNOSIS — K219 Gastro-esophageal reflux disease without esophagitis: Secondary | ICD-10-CM

## 2015-02-17 DIAGNOSIS — R932 Abnormal findings on diagnostic imaging of liver and biliary tract: Secondary | ICD-10-CM | POA: Diagnosis not present

## 2015-02-17 DIAGNOSIS — K529 Noninfective gastroenteritis and colitis, unspecified: Secondary | ICD-10-CM | POA: Diagnosis not present

## 2015-02-17 NOTE — Patient Instructions (Signed)
Follow up with Dr. Fuller Plan on one year.   Thank you for choosing me and [facility].  Pricilla Riffle. Dagoberto Ligas., MD., [facility]

## 2015-02-17 NOTE — Progress Notes (Signed)
History of Present Illness: This is a 69 year old male who was recently hospitalized  For presumed infectious gastroenteritis colitis. Discharge summary findings outlined below. Concern for a new diagnosis of cirrhosis which was not firmly established. Abdominal/pelvic CT scan on 01/14/2015 suggested the liver margins appear slightly nodular. Reviewing a prior abdominal/pelvic CT from 08/09/2004 similar liver findings were noted. LFTs were initially normal however he had minimal elevation of AST=53 and ALT=47. He has thrombocytopenia. Ferritin=222,  ANA negative, Hep B and C serologies were negative. Maintained on Coumadin for history of a fib.  He infrequently drinks a very modest amount of alcohol. No history of heavy alcohol use.   Hospital Course:  Acute gastroenteritis with possible infectious colitis/enteritis Placed On empiric Cipro and Flagyl. aggressive IV hydration. Stool for C. difficile negative. GI pathogen panel pending. -Symptoms much improved. Possibly gastroenteritis due to norovirus infection. Will discharge home on oral ciprofloxacin and Flagyl for 5 more days (will complete total seven-day course of antibiotic). -appreciate GI evaluation. [place] for discharge with outpt follow up.  Lactic acidosis with acute kidney injury Secondary to severe dehydration with vomiting and diarrhea. Renal function and lactic acid normalized with aggressive hydration.   elevated troponin Possibly associated with dehydration and acute kidney injury. EKG unremarkable. No chest pain symptoms. Subsequent troponin trending down.  Lower GI bleed Appears to be secondary to infectious colitis/enteritis. H&H stable. No further symptoms. Coumadin resumed upon discharge.  A. fib on Coumadin with supratherapeutic INR and lower GI bleed INR of 4.68 on admission. Received vitamin K. Coumadin just on hold. No further GI bleed. Resume Coumadin upon discharge.  ? UTI Asymptomatic. On empiric Cipro which will  cover.  Hypokalemia Replenished  HOCM/aortic valve insufficiency Receive aggressive hydration upon admission. Noted for some leg swellings. Not in any respiratory distress. Fluid discontinued. She'll results in 1-2 days.  Essential hypertension Occasions has on admission due to dehydration and symptoms repeated resumed upon discharge.  Hyperlipidemia Resume statin.  ?changes of liver cirrhosis seen on CT abdomen denies any alcohol use. LFTs normal. Hepatitis panel negative. ANA sent. GI plan on outpatient follow-up.   Thrombocytopenia Appears to be chronic (low 100s). Follow-up as outpatient    Current Medications, Allergies, Past Medical History, Past Surgical History, Family History and Social History were reviewed in [employer] record.  Physical Exam: General: Well developed, well nourished, no acute distress Head: Normocephalic and atraumatic Eyes:  sclerae anicteric, EOMI Ears: Normal auditory acuity Mouth: No deformity or lesions Lungs: Clear throughout to auscultation Heart: Regular rate and rhythm; no murmurs, rubs or bruits Abdomen: Soft, non tender and non distended. No masses, hepatosplenomegaly or hernias noted. Normal Bowel sounds Musculoskeletal: Symmetrical with no gross deformities  Pulses:  Normal pulses noted Extremities: No clubbing, cyanosis, edema or deformities noted Neurological: Alert oriented x 4, grossly nonfocal Psychological:  Alert and cooperative. Normal mood and affect  Assessment and Recommendations:  1.  Abnormal CT of the liver. Nodular hepatic contour noted on CT scans 10 years apart and prominent caudate lobe noted on 2006 CT. He has thrombocytopenia, etiology unclear, however, no splenomegaly on imaging. It is possible he has early cirrhosis. Further evaluation with endoscopy to evaluate for varices and/or liver biopsy is reasonable however he is not interested in pursuing either evaluation at this time. Consider  hematology evaluation. States he will reconsider EGD at the time of his next colonoscopy. Minimally elevated LFTs. Recheck LFTs in 2 months.   2. Resolved gastroenteritis, enterocolitis. Likely infectious.  No further evaluation planned.  3. GERD. Continue omeprazole daily and standard antireflux measures.  4. Mild chronic constipation. Continue high fiber diet and adequate water intake. Begin FiberCon 4 tablets daily.  5. Personal history of adenomatous colon polyps. Five-year surveillance colonoscopy due August 2018.

## 2015-02-23 ENCOUNTER — Ambulatory Visit (INDEPENDENT_AMBULATORY_CARE_PROVIDER_SITE_OTHER): Payer: Medicare Other

## 2015-02-23 ENCOUNTER — Ambulatory Visit (INDEPENDENT_AMBULATORY_CARE_PROVIDER_SITE_OTHER): Payer: Medicare Other | Admitting: Family Medicine

## 2015-02-23 ENCOUNTER — Encounter: Payer: Self-pay | Admitting: Family Medicine

## 2015-02-23 VITALS — BP 132/55 | HR 57 | Temp 97.3°F | Ht 67.25 in | Wt 199.0 lb

## 2015-02-23 DIAGNOSIS — I10 Essential (primary) hypertension: Secondary | ICD-10-CM

## 2015-02-23 DIAGNOSIS — Z0000 Encounter for general adult medical examination without abnormal findings: Secondary | ICD-10-CM

## 2015-02-23 DIAGNOSIS — I351 Nonrheumatic aortic (valve) insufficiency: Secondary | ICD-10-CM

## 2015-02-23 DIAGNOSIS — E559 Vitamin D deficiency, unspecified: Secondary | ICD-10-CM

## 2015-02-23 DIAGNOSIS — I480 Paroxysmal atrial fibrillation: Secondary | ICD-10-CM

## 2015-02-23 DIAGNOSIS — E785 Hyperlipidemia, unspecified: Secondary | ICD-10-CM

## 2015-02-23 DIAGNOSIS — I050 Rheumatic mitral stenosis: Secondary | ICD-10-CM | POA: Diagnosis not present

## 2015-02-23 DIAGNOSIS — I421 Obstructive hypertrophic cardiomyopathy: Secondary | ICD-10-CM | POA: Diagnosis not present

## 2015-02-23 DIAGNOSIS — M25512 Pain in left shoulder: Secondary | ICD-10-CM

## 2015-02-23 DIAGNOSIS — D509 Iron deficiency anemia, unspecified: Secondary | ICD-10-CM

## 2015-02-23 DIAGNOSIS — E538 Deficiency of other specified B group vitamins: Secondary | ICD-10-CM

## 2015-02-23 DIAGNOSIS — D696 Thrombocytopenia, unspecified: Secondary | ICD-10-CM

## 2015-02-23 LAB — POCT UA - MICROSCOPIC ONLY
Bacteria, U Microscopic: NEGATIVE
Casts, Ur, LPF, POC: NEGATIVE
Crystals, Ur, HPF, POC: NEGATIVE
Mucus, UA: NEGATIVE
WBC, Ur, HPF, POC: NEGATIVE
Yeast, UA: NEGATIVE

## 2015-02-23 LAB — POCT URINALYSIS DIPSTICK
Bilirubin, UA: NEGATIVE
Glucose, UA: NEGATIVE
Ketones, UA: NEGATIVE
Leukocytes, UA: NEGATIVE
Nitrite, UA: NEGATIVE
Protein, UA: NEGATIVE
Spec Grav, UA: 1.02
Urobilinogen, UA: NEGATIVE
pH, UA: 6

## 2015-02-23 LAB — POCT INR: INR: 2.2

## 2015-02-23 NOTE — Addendum Note (Signed)
Addended by: Selmer Dominion on: 02/23/2015 01:54 PM   Modules accepted: Orders

## 2015-02-23 NOTE — Patient Instructions (Addendum)
Medicare Annual Wellness Visit  [facility] and the medical providers at [facility] strive to bring you the best medical care.  In doing so we not only want to address your current medical conditions and concerns but also to detect new conditions early and prevent illness, disease and health-related problems.    Medicare offers a yearly Wellness Visit which allows our clinical staff to assess your need for preventative services including immunizations, lifestyle education, counseling to decrease risk of preventable diseases and screening for fall risk and other medical concerns.    This visit is provided free of charge (no copay) for all Medicare recipients. The clinical pharmacists at [facility] have begun to conduct these Wellness Visits which will also include a thorough review of all your medications.    As you primary medical provider recommend that you make an appointment for your Annual Wellness Visit if you have not done so already this year.  You may set up this appointment before you leave today or you may call back [phone]) and schedule an appointment.  Please make sure when you call that you mention that you are scheduling your Annual Wellness Visit with the clinical pharmacist so that the appointment may be made for the proper length of time.     Continue current medications. Continue good therapeutic lifestyle changes which include good diet and exercise. Fall precautions discussed with patient. If an FOBT was given today- please return it to our front desk. If you are over 49 years old - you may need Prevnar 63 or the adult Pneumonia vaccine.  **Flu shots are available--- please call and schedule a FLU-CLINIC appointment**  After your visit with [place] today you will receive a survey in the mail or online from [employer] regarding your care with [place]. Please take a moment to fill this out. Your feedback is very  important to [place] as you can help [place] better understand your patient needs as well as improve your experience and satisfaction. WE CARE ABOUT YOU!!!   Continue to stay active and this winter drink plenty of fluids Follow-up with cardiology and gastroenterology as planned Continue current medicines Use some warm wet compresses to the left shoulder We will call you with the x-ray results as soon as they become available If you continue to have pain please call us back and we may have to get an MRI and possibly some physical therapy or have you see the orthopedic surgeon

## 2015-02-23 NOTE — Progress Notes (Signed)
Subjective:    Patient ID: Joshua [name], male    DOB: 1945/12/25, 69 y.o.   MRN: [id number]  HPI Patient is here today for annual wellness exam and follow up of chronic medical problems which includes hypertension, hyperlipidemia, and a fib. He is taking medications regularly. The patient denies any chest pain or shortness of breath unless he has extreme exertion. He says that he swallows his food on unless it is shooting up well and sometimes wants to hang up in his throat. He is to hold the gastroenterologist about this and when he gets his colonoscopy done next year he will most likely have an endoscopy at the same time. He will notify him if he gets worse. He has not seen any blood in the stool or had any black tarry bowel movements. He is not having any nausea or vomiting. He has not had to take anything for his bowel movement since he had the severe intestinal virus and ended up in the hospital for this. His bowels are more formed and are doing better. He follows up regularly and yearly with the cardiologist. He has not had his eye examination year and he usually gets one once a year he is going to call the ophthalmologist and arrange this. He will get lab work done today and a urinalysis.    Patient Active Problem List   Diagnosis Date Noted  . Diarrhea   . Hepatic cirrhosis ([place])   . Rectal bleeding   . Acute gastroenteritis 01/14/2015  . Acute kidney injury ([place]) 01/14/2015  . Syncope and collapse 01/14/2015  . Hypokalemia, gastrointestinal losses 01/14/2015  . Lactic acidosis 01/14/2015  . UTI (lower urinary tract infection) 01/14/2015  . Lower GI bleeding 01/14/2015  . Elevated INR 01/14/2015  . Dehydration   . Arterial hypotension   . Erectile dysfunction 11/10/2014  . Thrombocytopenia ([place]) 01/30/2014  . Hyperlipidemia LDL goal <70 12/03/2013  . High risk medication use 05/21/2013  . BPH (benign prostatic hyperplasia) 01/29/2013  . History of hematuria 01/29/2013  .  Family history of colon cancer 01/29/2013  . PAF (paroxysmal atrial fibrillation) ([place]) 12/17/2012  . Aortic valve insufficiency 12/17/2012  . Mild mitral stenosis 12/17/2012  . Pulmonary infiltrates 07/17/2010  . COPD UNSPECIFIED 05/05/2010  . Hypertrophic obstructive cardiomyopathy ([place]) 03/31/2010  . ATRIAL FLUTTER 03/31/2010  . HEMOCCULT POSITIVE STOOL 10/12/2008  . B12 deficiency 10/11/2008  . HYPERCHOLESTEROLEMIA 10/11/2008  . ANEMIA, IRON DEFICIENCY 10/11/2008  . Coronary atherosclerosis 10/11/2008  . FIBRILLATION, ATRIAL 10/11/2008  . GASTRITIS 10/11/2008  . HIATAL HERNIA WITH REFLUX 10/11/2008  . COLONIC POLYPS, ADENOMATOUS, HX OF 10/11/2008   Outpatient Encounter Prescriptions as of 02/23/2015  Medication Sig  . acetaminophen (TYLENOL) 325 MG tablet Take 650 mg by mouth as directed.    . Cholecalciferol (VITAMIN D3) 2000 UNITS TABS Take 1 tablet by mouth daily.   . cyanocobalamin ([facility] VITAMIN B12) 1000 MCG tablet Take 1 tablet (1,000 mcg total) by mouth daily.  . disopyramide (NORPACE) 150 MG capsule Take 2 capsules (300 mg total) by mouth 2 (two) times daily.  . hydrochlorothiazide (HYDRODIURIL) 25 MG tablet Take 1 tablet (25 mg total) by mouth daily.  . mometasone (NASONEX) 50 MCG/ACT nasal spray Place 2 sprays into the nose daily. (Patient taking differently: Place 2 sprays into the nose daily as needed. )  . omeprazole (PRILOSEC) 10 MG capsule Take 1 capsule (10 mg total) by mouth daily.  . rosuvastatin (CRESTOR) 10 MG tablet Take 1 tablet (  10 mg total) by mouth daily.  . verapamil (CALAN) 120 MG tablet Take 1 tablet (120 mg total) by mouth daily. (Patient taking differently: Take 60 mg by mouth daily. )  . warfarin (COUMADIN) 4 MG tablet Take 4 mg by mouth every Wednesday. Only on Wednesday   No facility-administered encounter medications on file as of 02/23/2015.      Review of Systems  Constitutional: Negative.   HENT: Negative.   Eyes: Negative.   Respiratory:  Negative.   Cardiovascular: Negative.   Gastrointestinal: Negative.   Endocrine: Negative.   Genitourinary: Negative.   Musculoskeletal: Positive for arthralgias (left shoulder pain).  Skin: Negative.   Allergic/Immunologic: Negative.   Neurological: Negative.   Hematological: Negative.   Psychiatric/Behavioral: Negative.        Objective:   Physical Exam  Constitutional: He is oriented to person, place, and time. He appears well-developed and well-nourished. No distress.  The patient is alert and in good spirits.  HENT:  Head: Normocephalic and atraumatic.  Right Ear: External ear normal.  Left Ear: External ear normal.  Nose: Nose normal.  Mouth/Throat: Oropharynx is clear and moist. No oropharyngeal exudate.  Eyes: Conjunctivae and EOM are normal. Pupils are equal, round, and reactive to light. Right eye exhibits no discharge. Left eye exhibits no discharge. No scleral icterus.  Neck: Normal range of motion. Neck supple. No thyromegaly present.  No thyromegaly or carotid bruits  Cardiovascular: Normal rate, regular rhythm and intact distal pulses.   Murmur heard. At 60/m with a grade 3/6 systolic ejection murmur  Pulmonary/Chest: Effort normal and breath sounds normal. No respiratory distress. He has no wheezes. He has no rales. He exhibits no tenderness.  Abdominal: Soft. Bowel sounds are normal. He exhibits no mass. There is no tenderness. There is no rebound and no guarding.  Nontender without organs enlargement  Genitourinary: Rectum normal and penis normal.  Also it is slightly enlarged. There are no inguinal hernias palpable. The external joint a were within normal limits and there was no rectal masses.  Musculoskeletal: Normal range of motion. He exhibits no edema.  40 mg of Depo-Medrol was injected into the area of tenderness of the left deltoid muscle after sterile cleansing and a pressure dressing was applied and the patient tolerated the procedure well    Lymphadenopathy:    He has no cervical adenopathy.  Neurological: He is alert and oriented to person, place, and time. He has normal reflexes. No cranial nerve deficit.  Skin: Skin is warm and dry. No rash noted.  Psychiatric: He has a normal mood and affect. His behavior is normal. Judgment and thought content normal.  Nursing note and vitals reviewed.   BP 132/55 mmHg  Pulse 57  Temp(Src) 97.3 F (36.3 C) (Oral)  Ht 5' 7.25" (1.708 m)  Wt 199 lb (90.266 kg)  BMI 30.94 kg/m2 [facility] reading (PRIMARY) by  Dr.Konstantine Gervasi-chest x-ray, left shoulder, cervical spine--- left before meals joint arthritis, scar tissue and chest that seems to be stable from past years, no acute cervical abnormalities noted                                      Assessment & Plan:  1. PAF (paroxysmal atrial fibrillation) (HCC) -The patient was in normal sinus rhythm today and he gives no history of any bouts with an irregular heartbeat recently. - CBC with Differential/Platelet - POCT INR  2.  B12 deficiency -Continue B12 injections pending results of lab work -I will add a B12 level to the current lab work - CBC with Differential/Platelet  3. Vitamin D deficiency -Continue vitamin D replacement pending results of lab work - CBC with Differential/Platelet - VITAMIN D 25 Hydroxy (Vit-D Deficiency, Fractures)  4. Hyperlipemia -Continue current treatment pending results of lab work - CBC with Differential/Platelet - NMR, lipoprofile  5. Essential hypertension -The blood pressure is good today he should continue with current treatment - BMP8+EGFR - CBC with Differential/Platelet - Hepatic function panel - DG Chest 2 View; Future  6. Thrombocytopenia ([place]) -The patient does have an injury to the left arm with a lot of bruising and he is on Coumadin. We will recheck his platelet count with the blood work today and make sure this is stable - CBC with Differential/Platelet  7. Annual physical exam -Continue  to follow up with gastroenterology and cardiology - POCT urinalysis dipstick - POCT UA - Microscopic Only - BMP8+EGFR - CBC with Differential/Platelet - Hepatic function panel - NMR, lipoprofile - PSA, total and free - VITAMIN D 25 Hydroxy (Vit-D Deficiency, Fractures) - DG Chest 2 View; Future  8. Left shoulder pain -Depo-Medrol 40 mg to the left shoulder - DG Cervical Spine Complete; Future - DG Shoulder Left; Future  9. Aortic valve insufficiency -Continue follow-up with cardiology - POCT INR  10. Mild mitral stenosis -Continue cardiology follow-up  11. Hypertrophic obstructive cardiomyopathy ([place]) -Continue cardiology follow-up  12. Hyperlipidemia LDL goal <70 -Continue current treatment pending results of lab work  13. ANEMIA, IRON DEFICIENCY  Patient Instructions                       Medicare Annual Wellness Visit  [facility] and the medical providers at [facility] strive to bring you the best medical care.  In doing so we not only want to address your current medical conditions and concerns but also to detect new conditions early and prevent illness, disease and health-related problems.    Medicare offers a yearly Wellness Visit which allows our clinical staff to assess your need for preventative services including immunizations, lifestyle education, counseling to decrease risk of preventable diseases and screening for fall risk and other medical concerns.    This visit is provided free of charge (no copay) for all Medicare recipients. The clinical pharmacists at [facility] have begun to conduct these Wellness Visits which will also include a thorough review of all your medications.    As you primary medical provider recommend that you make an appointment for your Annual Wellness Visit if you have not done so already this year.  You may set up this appointment before you leave today or you may call back ([phone]) and  schedule an appointment.  Please make sure when you call that you mention that you are scheduling your Annual Wellness Visit with the clinical pharmacist so that the appointment may be made for the proper length of time.     Continue current medications. Continue good therapeutic lifestyle changes which include good diet and exercise. Fall precautions discussed with patient. If an FOBT was given today- please return it to our front desk. If you are over 83 years old - you may need Prevnar 46 or the adult Pneumonia vaccine.  **Flu shots are available--- please call and schedule a FLU-CLINIC appointment**  After your visit with [place] today you will receive a survey in the  mail or online from [employer] regarding your care with [place]. Please take a moment to fill this out. Your feedback is very important to [place] as you can help [place] better understand your patient needs as well as improve your experience and satisfaction. WE CARE ABOUT YOU!!!   Continue to stay active and this winter drink plenty of fluids Follow-up with cardiology and gastroenterology as planned Continue current medicines Use some warm wet compresses to the left shoulder We will call you with the x-ray results as soon as they become available If you continue to have pain please call us back and we may have to get an MRI and possibly some physical therapy or have you see the orthopedic surgeon   Arrie Senate MD

## 2015-02-24 LAB — CBC WITH DIFFERENTIAL/PLATELET
Basophils Absolute: 0 10*3/uL (ref 0.0–0.2)
Basos: 0 %
EOS (ABSOLUTE): 0.2 10*3/uL (ref 0.0–0.4)
Eos: 3 %
Hematocrit: 44.6 % (ref 37.5–51.0)
Hemoglobin: 14.8 g/dL (ref 12.6–17.7)
Immature Grans (Abs): 0 10*3/uL (ref 0.0–0.1)
Immature Granulocytes: 0 %
Lymphocytes Absolute: 1.8 10*3/uL (ref 0.7–3.1)
Lymphs: 29 %
MCH: 31 pg (ref 26.6–33.0)
MCHC: 33.2 g/dL (ref 31.5–35.7)
MCV: 94 fL (ref 79–97)
Monocytes Absolute: 0.4 10*3/uL (ref 0.1–0.9)
Monocytes: 7 %
Neutrophils Absolute: 3.7 10*3/uL (ref 1.4–7.0)
Neutrophils: 61 %
Platelets: 141 10*3/uL — ABNORMAL LOW (ref 150–379)
RBC: 4.77 x10E6/uL (ref 4.14–5.80)
RDW: 13.3 % (ref 12.3–15.4)
WBC: 6.2 10*3/uL (ref 3.4–10.8)

## 2015-02-24 LAB — BMP8+EGFR
BUN/Creatinine Ratio: 17 (ref 10–22)
BUN: 19 mg/dL (ref 8–27)
CO2: 28 mmol/L (ref 18–29)
Calcium: 8.7 mg/dL (ref 8.6–10.2)
Chloride: 99 mmol/L (ref 96–106)
Creatinine, Ser: 1.11 mg/dL (ref 0.76–1.27)
GFR calc Af Amer: 77 mL/min/{1.73_m2} (ref >59)
GFR calc non Af Amer: 67 mL/min/{1.73_m2} (ref >59)
Glucose: 80 mg/dL (ref 65–99)
Potassium: 3.6 mmol/L (ref 3.5–5.2)
Sodium: 138 mmol/L (ref 134–144)

## 2015-02-24 LAB — HEPATIC FUNCTION PANEL
ALT: 19 IU/L (ref 0–44)
AST: 25 IU/L (ref 0–40)
Albumin: 3.8 g/dL (ref 3.5–4.8)
Alkaline Phosphatase: 66 IU/L (ref 39–117)
Bilirubin Total: 0.5 mg/dL (ref 0.0–1.2)
Bilirubin, Direct: 0.18 mg/dL (ref 0.00–0.40)
Total Protein: 6.9 g/dL (ref 6.0–8.5)

## 2015-02-24 LAB — NMR, LIPOPROFILE
Cholesterol: 157 mg/dL (ref 100–199)
HDL Cholesterol by NMR: 44 mg/dL (ref >39)
HDL Particle Number: 30.7 umol/L (ref >=30.5)
LDL Particle Number: 988 nmol/L (ref <1000)
LDL Size: 20.1 nm (ref >20.5)
LDL-C: 77 mg/dL (ref 0–99)
LP-IR Score: 52 — ABNORMAL HIGH (ref <=45)
Small LDL Particle Number: 682 nmol/L — ABNORMAL HIGH (ref <=527)
Triglycerides by NMR: 182 mg/dL — ABNORMAL HIGH (ref 0–149)

## 2015-02-24 LAB — VITAMIN B12: Vitamin B-12: 341 pg/mL (ref 211–946)

## 2015-02-24 LAB — PSA, TOTAL AND FREE
PSA, Free Pct: 43.3 %
PSA, Free: 0.13 ng/mL
Prostate Specific Ag, Serum: 0.3 ng/mL (ref 0.0–4.0)

## 2015-02-24 LAB — VITAMIN D 25 HYDROXY (VIT D DEFICIENCY, FRACTURES): Vit D, 25-Hydroxy: 40.3 ng/mL (ref 30.0–100.0)

## 2015-03-01 ENCOUNTER — Telehealth: Payer: Self-pay | Admitting: Cardiovascular Disease

## 2015-03-01 NOTE — Telephone Encounter (Signed)
Pt left a letter for Dr Claiborne Billings to call so he can continue to take Disopyramide. [employer] said they have not received anything fom Dr Claiborne Billings. He left this at the front desk,he thinks it was around December 13th.Pt said you could call him after 3 today.

## 2015-03-02 MED ORDER — DISOPYRAMIDE PHOSPHATE 150 MG PO CAPS: 300.0000 mg | ORAL_CAPSULE | Freq: Two times a day (BID) | ORAL | Status: DC

## 2015-03-02 NOTE — Telephone Encounter (Signed)
Routed to Wanda 

## 2015-03-02 NOTE — Telephone Encounter (Addendum)
The pt states that he has enough Disopyramide to last him until March. He is advised to contact his pharmacy and ask them to contact us concerning his prior auth for his Disopyramide. I have also sent a refill for Disopyramide to [facility] (the pts pharmacy) for #360 with no refills to help get the prior auth started and the pt is aware that I have done that. He verbalized understanding and states that he has to go to his pharmacy tomorrow anyway and will request that they contact us concerning his prior authorization.

## 2015-03-02 NOTE — Addendum Note (Signed)
**Note De-Identified Dontasia Miranda Obfuscation** Addended by: Dennie Fetters on: 03/02/2015 05:47 PM   Modules accepted: Orders

## 2015-03-14 ENCOUNTER — Telehealth: Payer: Self-pay | Admitting: Family Medicine

## 2015-03-14 ENCOUNTER — Telehealth: Payer: Self-pay | Admitting: Cardiovascular Disease

## 2015-03-14 NOTE — Telephone Encounter (Signed)
Reviewed with Kerin Ransom PA.  Patient has been on both medications since at least April 2016,  Is being monitored by Dr. Claiborne Billings.  Okay to refill

## 2015-03-14 NOTE — Telephone Encounter (Signed)
Pharmacy advised to call cardiologist since they prescribed Norpace

## 2015-03-14 NOTE — Telephone Encounter (Signed)
Returned call to pharmacist and reviewed - verified OK for refill.

## 2015-03-14 NOTE — Telephone Encounter (Signed)
Santiago Glad called in stating that Dr. Claiborne Billings prescribed Disopyramide for the pt but he is currently already on Verapamil. Santiago Glad says that is a level 1 interaction and wanted to make Dr. Claiborne Billings aware of this. Please f/u with her  Thanks

## 2015-03-21 ENCOUNTER — Telehealth: Payer: Self-pay | Admitting: Cardiovascular Disease

## 2015-03-21 NOTE — Telephone Encounter (Signed)
Pt said he dropped off some papers from [facility] his medicine. Its been about 2 months and he still have not received them.

## 2015-03-21 NOTE — Telephone Encounter (Signed)
Left message for pt to call his pharmacy to get refill on medication so that we can start the Prior Authorization process. Pt is to stay on Disopyramide 150 mg per MD.

## 2015-03-29 ENCOUNTER — Ambulatory Visit (INDEPENDENT_AMBULATORY_CARE_PROVIDER_SITE_OTHER): Payer: Medicare Other | Admitting: Pharmacist

## 2015-03-29 ENCOUNTER — Ambulatory Visit (INDEPENDENT_AMBULATORY_CARE_PROVIDER_SITE_OTHER): Payer: Medicare Other

## 2015-03-29 DIAGNOSIS — M25512 Pain in left shoulder: Secondary | ICD-10-CM | POA: Diagnosis not present

## 2015-03-29 DIAGNOSIS — I480 Paroxysmal atrial fibrillation: Secondary | ICD-10-CM | POA: Diagnosis not present

## 2015-03-29 DIAGNOSIS — I351 Nonrheumatic aortic (valve) insufficiency: Secondary | ICD-10-CM

## 2015-03-29 DIAGNOSIS — I4891 Unspecified atrial fibrillation: Secondary | ICD-10-CM

## 2015-03-29 LAB — POCT INR: INR: 2.5

## 2015-03-29 NOTE — Patient Instructions (Signed)
Anticoagulation Dose Instructions as of 03/29/2015      Joshua [name] Tue Wed Thu Fri Sat   New Dose 4 mg 6 mg 4 mg 4 mg 4 mg 6 mg 4 mg    Description        Continue current warfarin 4mg  dose of 1 and 1/2 tablets on mondays and fridays and 1 tablet all other days.       INR was 2.5 today

## 2015-03-29 NOTE — Progress Notes (Signed)
Per Dr Tawanna Sat note on xray from 01/2015 repeating left shoulder xray

## 2015-03-31 ENCOUNTER — Telehealth: Payer: Self-pay | Admitting: Cardiovascular Disease

## 2015-03-31 NOTE — Telephone Encounter (Signed)
F/u  Pt returning RN phone call concerning paperwork dropped off by pt. Please call back and discuss.

## 2015-03-31 NOTE — Telephone Encounter (Signed)
Forward to [place]

## 2015-05-03 ENCOUNTER — Ambulatory Visit: Payer: Medicare Other

## 2015-05-04 ENCOUNTER — Ambulatory Visit (INDEPENDENT_AMBULATORY_CARE_PROVIDER_SITE_OTHER): Payer: Medicare Other | Admitting: Family Medicine

## 2015-05-04 ENCOUNTER — Encounter: Payer: Self-pay | Admitting: Family Medicine

## 2015-05-04 VITALS — BP 125/62 | HR 58 | Temp 97.8°F | Ht 67.25 in | Wt 198.6 lb

## 2015-05-04 DIAGNOSIS — R42 Dizziness and giddiness: Secondary | ICD-10-CM

## 2015-05-04 NOTE — Progress Notes (Signed)
BP 125/62 mmHg  Pulse 58  Temp(Src) 97.8 F (36.6 C) (Oral)  Ht 5' 7.25" (1.708 m)  Wt 198 lb 9.6 oz (90.084 kg)  BMI 30.88 kg/m2   Subjective:    Patient ID: Joshua [name], male    DOB: 10/21/1945, 70 y.o.   MRN: [id number]  HPI: Joshua [name] is a 70 y.o. male presenting on 05/04/2015 for Dizziness & fatigue   HPI Lightheadedness and dizziness Patient has been having lightheadedness and dizziness intermittently over the past 2 weeks. He has also noticed that his heart rate is been down. The first episode occurred while he was kayaking down in [place] and he thought it might of been just the heat but then another episode happened while he was in his workplace in a cool environment and he felt lightheaded and dizzy and had to put his head down for a few minutes so it wouldn't pass out. He denies any fevers or chills or abdominal pain or dysuria. He didn't take his blood pressure and heart rate the second time in his blood pressure was in the 120s over 60s but his heart rate was down in the 40s.  Relevant past medical, surgical, family and social history reviewed and updated as indicated. Interim medical history since our last visit reviewed. Allergies and medications reviewed and updated.  Review of Systems  Constitutional: Negative for fever and chills.  HENT: Negative for ear discharge and ear pain.   Eyes: Negative for discharge and visual disturbance.  Respiratory: Negative for shortness of breath and wheezing.   Cardiovascular: Negative for chest pain and leg swelling.  Gastrointestinal: Negative for abdominal pain, diarrhea and constipation.  Genitourinary: Negative for difficulty urinating.  Musculoskeletal: Negative for back pain and gait problem.  Skin: Negative for rash.  Neurological: Positive for dizziness and light-headedness. Negative for syncope and headaches.  All other systems reviewed and are negative.   Per HPI unless specifically indicated above       Medication List       This list is accurate as of: 05/04/15  9:44 AM.  Always use your most recent med list.               acetaminophen 325 MG tablet  Commonly known as:  TYLENOL  Take 650 mg by mouth as directed.     cyanocobalamin 1000 MCG tablet  Commonly known as:  [facility]S VITAMIN B12  Take 1 tablet (1,000 mcg total) by mouth daily.     disopyramide 150 MG capsule  Commonly known as:  NORPACE  Take 2 capsules (300 mg total) by mouth 2 (two) times daily.     hydrochlorothiazide 25 MG tablet  Commonly known as:  HYDRODIURIL  Take 1 tablet (25 mg total) by mouth daily.     mometasone 50 MCG/ACT nasal spray  Commonly known as:  NASONEX  Place 2 sprays into the nose daily.     omeprazole 10 MG capsule  Commonly known as:  PRILOSEC  Take 1 capsule (10 mg total) by mouth daily.     Probiotic 250 MG Caps  Take 250 mg by mouth 3 (three) times daily.     rosuvastatin 10 MG tablet  Commonly known as:  CRESTOR  Take 1 tablet (10 mg total) by mouth daily.     Vitamin D3 2000 units Tabs  Take 1 tablet by mouth daily.     warfarin 4 MG tablet  Commonly known as:  COUMADIN  Take 4 mg by  mouth every Wednesday. Only on Wednesday           Objective:    BP 125/62 mmHg  Pulse 58  Temp(Src) 97.8 F (36.6 C) (Oral)  Ht 5' 7.25" (1.708 m)  Wt 198 lb 9.6 oz (90.084 kg)  BMI 30.88 kg/m2  Wt Readings from Last 3 Encounters:  05/04/15 198 lb 9.6 oz (90.084 kg)  02/23/15 199 lb (90.266 kg)  02/17/15 197 lb 8 oz (89.585 kg)    Physical Exam  Constitutional: He is oriented to person, place, and time. He appears well-developed and well-nourished. No distress.  Eyes: Conjunctivae and EOM are normal. Pupils are equal, round, and reactive to light. Right eye exhibits no discharge. No scleral icterus.  Neck: Neck supple. No thyromegaly present.  Cardiovascular: Normal rate, regular rhythm and intact distal pulses.   Murmur (systolic murmur loudest in the mitral region)  heard. Pulmonary/Chest: Effort normal and breath sounds normal. No respiratory distress. He has no wheezes. He has no rales.  Musculoskeletal: Normal range of motion. He exhibits no edema.  Lymphadenopathy:    He has no cervical adenopathy.  Neurological: He is alert and oriented to person, place, and time. Coordination normal.  Skin: Skin is warm and dry. No rash noted. He is not diaphoretic.  Psychiatric: He has a normal mood and affect. His behavior is normal.  Vitals reviewed.   Results for orders placed or performed in visit on 03/29/15  POCT INR  Result Value Ref Range   INR 2.5       Assessment & Plan:   Problem List Items Addressed This Visit    None    Visit Diagnoses    Dizziness    -  Primary    Patient has been having dizziness and lightheadedness, his heart rate is also been low, will stop verapamil and keep him on his Norpace and see how it goes        Follow up plan: Return in about 4 weeks (around 06/01/2015), or if symptoms worsen or fail to improve, for Hypertension recheck.  Counseling provided for all of the vaccine components No orders of the defined types were placed in this encounter.    Caryl Pina, MD [facility] 05/04/2015, 9:44 AM

## 2015-05-10 ENCOUNTER — Ambulatory Visit (INDEPENDENT_AMBULATORY_CARE_PROVIDER_SITE_OTHER): Payer: Medicare Other | Admitting: Pharmacist Clinician (PhC)/ Clinical Pharmacy Specialist

## 2015-05-10 DIAGNOSIS — I482 Chronic atrial fibrillation, unspecified: Secondary | ICD-10-CM

## 2015-05-10 LAB — COAGUCHEK XS/INR WAIVED
INR: 3.2 — ABNORMAL HIGH (ref 0.9–1.1)
Prothrombin Time: 38.2 s

## 2015-05-10 NOTE — Patient Instructions (Signed)
Anticoagulation Dose Instructions as of 05/10/2015      Joshua [name] Tue Wed Thu Fri Sat   New Dose 4 mg 6 mg 4 mg 4 mg 4 mg 6 mg 4 mg    Description        Take 1/2 tablet today and then resume your schedule      INR 3.2 today  "a little thin today"

## 2015-06-07 NOTE — Telephone Encounter (Signed)
No paperwork received by me for this medication request.

## 2015-06-28 ENCOUNTER — Encounter: Payer: Self-pay | Admitting: Pharmacist Clinician (PhC)/ Clinical Pharmacy Specialist

## 2015-06-29 ENCOUNTER — Ambulatory Visit (INDEPENDENT_AMBULATORY_CARE_PROVIDER_SITE_OTHER): Payer: Medicare Other | Admitting: Pharmacist

## 2015-06-29 DIAGNOSIS — I482 Chronic atrial fibrillation, unspecified: Secondary | ICD-10-CM

## 2015-06-29 LAB — COAGUCHEK XS/INR WAIVED
INR: 2.1 — ABNORMAL HIGH (ref 0.9–1.1)
Prothrombin Time: 25.1 s

## 2015-06-29 NOTE — Patient Instructions (Signed)
Anticoagulation Dose Instructions as of 06/29/2015      [name] Tue Wed Thu Fri Sat   New Dose 4 mg 6 mg 4 mg 4 mg 4 mg 6 mg 4 mg    Description        Continue current warfarin 4mg  - take 1 and 1/2 tablets on mondays and fridays.  Take 1 tablet all other days.      INR was 2.1 today

## 2015-07-13 ENCOUNTER — Encounter: Payer: Self-pay | Admitting: Family Medicine

## 2015-07-13 ENCOUNTER — Ambulatory Visit (INDEPENDENT_AMBULATORY_CARE_PROVIDER_SITE_OTHER): Payer: Medicare Other | Admitting: Family Medicine

## 2015-07-13 VITALS — BP 123/54 | HR 50 | Temp 97.0°F | Ht 67.25 in | Wt 197.0 lb

## 2015-07-13 DIAGNOSIS — Z1211 Encounter for screening for malignant neoplasm of colon: Secondary | ICD-10-CM | POA: Diagnosis not present

## 2015-07-13 DIAGNOSIS — I480 Paroxysmal atrial fibrillation: Secondary | ICD-10-CM | POA: Diagnosis not present

## 2015-07-13 DIAGNOSIS — K7460 Unspecified cirrhosis of liver: Secondary | ICD-10-CM | POA: Diagnosis not present

## 2015-07-13 DIAGNOSIS — E559 Vitamin D deficiency, unspecified: Secondary | ICD-10-CM | POA: Diagnosis not present

## 2015-07-13 DIAGNOSIS — D692 Other nonthrombocytopenic purpura: Secondary | ICD-10-CM | POA: Diagnosis not present

## 2015-07-13 DIAGNOSIS — M542 Cervicalgia: Secondary | ICD-10-CM

## 2015-07-13 DIAGNOSIS — I10 Essential (primary) hypertension: Secondary | ICD-10-CM

## 2015-07-13 DIAGNOSIS — E538 Deficiency of other specified B group vitamins: Secondary | ICD-10-CM | POA: Diagnosis not present

## 2015-07-13 DIAGNOSIS — I4891 Unspecified atrial fibrillation: Secondary | ICD-10-CM

## 2015-07-13 DIAGNOSIS — E785 Hyperlipidemia, unspecified: Secondary | ICD-10-CM

## 2015-07-13 DIAGNOSIS — I421 Obstructive hypertrophic cardiomyopathy: Secondary | ICD-10-CM | POA: Diagnosis not present

## 2015-07-13 DIAGNOSIS — D696 Thrombocytopenia, unspecified: Secondary | ICD-10-CM | POA: Diagnosis not present

## 2015-07-13 NOTE — Progress Notes (Signed)
Subjective:    Patient ID: Joshua [name], male    DOB: 1945/12/28, 70 y.o.   MRN: [id number]  HPI Pt here for follow up and management of chronic medical problems which includes hypertension and hyperlipidemia. He is taking medications regularly.The patient is doing well overall. He only complains today of some neck stiffness. He is due to get lab work and to return an FOBT. The patient continues to be followed regularly by his cardiologist Dr. Claiborne Billings. He is also seen his gastroenterologist Dr. Fuller Plan back in the fall. The patient denies any chest pain or chest tightness. He does say that he gets tired more easily. He has a active lifestyle overall. He likes to kayak a lot. He denies any chest pain or tightness. He also denies any additional shortness of breath. He swallowing his food okay but feels like he has to chew it more in order to prevent trouble with swallowing. He especially notices this with beef. He has no other swallowing intolerances that he could mention. His stools are normal in color and consistency and there is no black tarry bowel movements or blood in the stool. He is passing his water without problems. The neck discomfort has been going on for 24 hours or so. He does not feel like it is bad enough to get any x-rays at this time.     Patient Active Problem List   Diagnosis Date Noted  . Hepatic cirrhosis ([place])   . Syncope and collapse 01/14/2015  . Lower GI bleeding 01/14/2015  . Elevated INR 01/14/2015  . Arterial hypotension   . Erectile dysfunction 11/10/2014  . Thrombocytopenia ([place]) 01/30/2014  . Hyperlipidemia LDL goal <70 12/03/2013  . High risk medication use 05/21/2013  . BPH (benign prostatic hyperplasia) 01/29/2013  . Family history of colon cancer 01/29/2013  . PAF (paroxysmal atrial fibrillation) ([place]) 12/17/2012  . Aortic valve insufficiency 12/17/2012  . Mild mitral stenosis 12/17/2012  . Pulmonary infiltrates 07/17/2010  . COPD UNSPECIFIED 05/05/2010  .  Hypertrophic obstructive cardiomyopathy ([place]) 03/31/2010  . ATRIAL FLUTTER 03/31/2010  . B12 deficiency 10/11/2008  . ANEMIA, IRON DEFICIENCY 10/11/2008  . Coronary atherosclerosis 10/11/2008  . FIBRILLATION, ATRIAL 10/11/2008  . HIATAL HERNIA WITH REFLUX 10/11/2008  . COLONIC POLYPS, ADENOMATOUS, HX OF 10/11/2008   Outpatient Encounter Prescriptions as of 07/13/2015  Medication Sig  . acetaminophen (TYLENOL) 325 MG tablet Take 650 mg by mouth as directed.    . Cholecalciferol (VITAMIN D3) 2000 UNITS TABS Take 1 tablet by mouth daily.   . cyanocobalamin ([employer] VITAMIN B12) 1000 MCG tablet Take 1 tablet (1,000 mcg total) by mouth daily.  . disopyramide (NORPACE) 150 MG capsule Take 2 capsules (300 mg total) by mouth 2 (two) times daily.  . hydrochlorothiazide (HYDRODIURIL) 25 MG tablet Take 1 tablet (25 mg total) by mouth daily.  . mometasone (NASONEX) 50 MCG/ACT nasal spray Place 2 sprays into the nose daily.  Marland Kitchen omeprazole (PRILOSEC) 10 MG capsule Take 1 capsule (10 mg total) by mouth daily.  . rosuvastatin (CRESTOR) 10 MG tablet Take 1 tablet (10 mg total) by mouth daily.  . Saccharomyces boulardii (PROBIOTIC) 250 MG CAPS Take 250 mg by mouth 3 (three) times daily.  Marland Kitchen warfarin (COUMADIN) 4 MG tablet Take 4 mg by mouth every Wednesday. Only on Wednesday   No facility-administered encounter medications on file as of 07/13/2015.      Review of Systems  Constitutional: Negative.   HENT: Negative.   Eyes: Negative.   Respiratory:  Negative.   Cardiovascular: Negative.   Gastrointestinal: Negative.   Endocrine: Negative.   Genitourinary: Negative.   Musculoskeletal: Positive for neck stiffness.  Skin: Negative.   Allergic/Immunologic: Negative.   Neurological: Negative.   Hematological: Negative.   Psychiatric/Behavioral: Negative.        Objective:   Physical Exam  Constitutional: He is oriented to person, place, and time. He appears well-developed and well-nourished. No  distress.  HENT:  Head: Normocephalic and atraumatic.  Right Ear: External ear normal.  Left Ear: External ear normal.  Nose: Nose normal.  Mouth/Throat: Oropharynx is clear and moist. No oropharyngeal exudate.  Eyes: Conjunctivae and EOM are normal. Pupils are equal, round, and reactive to light. Right eye exhibits no discharge. Left eye exhibits no discharge. No scleral icterus.  Neck: Normal range of motion. Neck supple. No thyromegaly present.  No bruits or thyromegaly  Cardiovascular: Normal rate, regular rhythm and intact distal pulses.   No murmur heard. The heart was regular at 72/m  Pulmonary/Chest: Effort normal and breath sounds normal. No respiratory distress. He has no wheezes. He has no rales. He exhibits no tenderness.  Clear anteriorly and posteriorly no axillary adenopathy  Abdominal: Soft. Bowel sounds are normal. He exhibits no mass. There is no tenderness. There is no rebound and no guarding.  No abdominal tenderness liver or spleen enlargement or bruits  Musculoskeletal: Normal range of motion. He exhibits no edema.  Lymphadenopathy:    He has no cervical adenopathy.  Neurological: He is alert and oriented to person, place, and time. He has normal reflexes. No cranial nerve deficit.  Skin: Skin is warm and dry. No rash noted.  Psychiatric: He has a normal mood and affect. His behavior is normal. Judgment and thought content normal.  Nursing note and vitals reviewed.   BP 123/54 mmHg  Pulse 50  Temp(Src) 97 F (36.1 C) (Oral)  Ht 5' 7.25" (1.708 m)  Wt 197 lb (89.359 kg)  BMI 30.63 kg/m2       Assessment & Plan:  1. Vitamin D deficiency -Continue current treatment pending results of lab work - CBC with Differential/Platelet - VITAMIN D 25 Hydroxy (Vit-D Deficiency, Fractures)  2. Hyperlipemia -Continue current treatment pending results of lab work - CBC with Differential/Platelet - NMR, lipoprofile  3. Essential hypertension -Blood pressure is good  today and patient will continue with his Hydrea diarrheal but will be more careful with this in the summertime when is very hot so that he does not get dehydrated or overheated. - BMP8+EGFR - CBC with Differential/Platelet - Hepatic function panel  4. Thrombocytopenia ([place]) -The patient does have some senile purpura and we will get a CBC with platelet count today. - CBC with Differential/Platelet  5. Atrial fibrillation, unspecified type ([place]) -Continue follow-up with cardiology - CBC with Differential/Platelet  6. Special screening for malignant neoplasms, colon - Fecal occult blood, imunochemical; Future - CBC with Differential/Platelet  7. Hepatic cirrhosis, unspecified hepatic cirrhosis type ([place]) -Continue follow-up with gastroenterology  8. Hyperlipidemia LDL goal <70 -Continue aggressive therapeutic lifestyle changes as well as current Crestor prescription  9. PAF (paroxysmal atrial fibrillation) ([place]) -The rhythm was stable today and he will continue to follow-up with cardiology  10. Hypertrophic obstructive cardiomyopathy ([place]) -Follow-up with cardiology  11. B12 deficiency  12. Neck pain -Use moist heat 20 minutes 3 or 4 times daily and if not improved after a couple weeks return to the office and we will arrange to get C-spine films  13. Senile  purpura Patient Instructions                       Medicare Annual Wellness Visit  [facility] and the medical providers at [facility] strive to bring you the best medical care.  In doing so we not only want to address your current medical conditions and concerns but also to detect new conditions early and prevent illness, disease and health-related problems.    Medicare offers a yearly Wellness Visit which allows our clinical staff to assess your need for preventative services including immunizations, lifestyle education, counseling to decrease risk of preventable diseases and screening for fall risk  and other medical concerns.    This visit is provided free of charge (no copay) for all Medicare recipients. The clinical pharmacists at [facility] have begun to conduct these Wellness Visits which will also include a thorough review of all your medications.    As you primary medical provider recommend that you make an appointment for your Annual Wellness Visit if you have not done so already this year.  You may set up this appointment before you leave today or you may call back [phone]) and schedule an appointment.  Please make sure when you call that you mention that you are scheduling your Annual Wellness Visit with the clinical pharmacist so that the appointment may be made for the proper length of time.     Continue current medications. Continue good therapeutic lifestyle changes which include good diet and exercise. Fall precautions discussed with patient. If an FOBT was given today- please return it to our front desk. If you are over 66 years old - you may need Prevnar 39 or the adult Pneumonia vaccine.  **Flu shots are available--- please call and schedule a FLU-CLINIC appointment**  After your visit with [place] today you will receive a survey in the mail or online from [employer] regarding your care with [place]. Please take a moment to fill this out. Your feedback is very important to [place] as you can help [place] better understand your patient needs as well as improve your experience and satisfaction. WE CARE ABOUT YOU!!!   Follow-up with Dr. Claiborne Billings as planned Remember to stay well hydrated and not get overheated especially with taking a diuretic in the summertime Monitor pulse rates regularly and blood pressures when possible Use warm wet compresses to the posterior neck 20 minutes 3 or 4 times daily and take an extra strength Tylenol if needed for pain Avoid heavy lifting pushing pulling If pain continues return to the office and we will get C-spine films   Arrie Senate  MD

## 2015-07-13 NOTE — Patient Instructions (Addendum)
Medicare Annual Wellness Visit  [facility] and the medical providers at [facility] strive to bring you the best medical care.  In doing so we not only want to address your current medical conditions and concerns but also to detect new conditions early and prevent illness, disease and health-related problems.    Medicare offers a yearly Wellness Visit which allows our clinical staff to assess your need for preventative services including immunizations, lifestyle education, counseling to decrease risk of preventable diseases and screening for fall risk and other medical concerns.    This visit is provided free of charge (no copay) for all Medicare recipients. The clinical pharmacists at [facility] have begun to conduct these Wellness Visits which will also include a thorough review of all your medications.    As you primary medical provider recommend that you make an appointment for your Annual Wellness Visit if you have not done so already this year.  You may set up this appointment before you leave today or you may call back [phone] and schedule an appointment.  Please make sure when you call that you mention that you are scheduling your Annual Wellness Visit with the clinical pharmacist so that the appointment may be made for the proper length of time.     Continue current medications. Continue good therapeutic lifestyle changes which include good diet and exercise. Fall precautions discussed with patient. If an FOBT was given today- please return it to our front desk. If you are over 75 years old - you may need Prevnar 73 or the adult Pneumonia vaccine.  **Flu shots are available--- please call and schedule a FLU-CLINIC appointment**  After your visit with [place] today you will receive a survey in the mail or online from [employer] regarding your care with [place]. Please take a moment to fill this out. Your feedback is very  important to [place] as you can help [place] better understand your patient needs as well as improve your experience and satisfaction. WE CARE ABOUT YOU!!!   Follow-up with Dr. Claiborne Billings as planned Remember to stay well hydrated and not get overheated especially with taking a diuretic in the summertime Monitor pulse rates regularly and blood pressures when possible Use warm wet compresses to the posterior neck 20 minutes 3 or 4 times daily and take an extra strength Tylenol if needed for pain Avoid heavy lifting pushing pulling If pain continues return to the office and we will get C-spine films

## 2015-07-14 ENCOUNTER — Encounter: Payer: Self-pay | Admitting: Cardiovascular Disease

## 2015-07-14 ENCOUNTER — Other Ambulatory Visit: Payer: Medicare Other

## 2015-07-14 ENCOUNTER — Ambulatory Visit (INDEPENDENT_AMBULATORY_CARE_PROVIDER_SITE_OTHER): Payer: No Typology Code available for payment source | Admitting: Cardiovascular Disease

## 2015-07-14 VITALS — BP 132/58 | HR 59 | Ht 69.0 in | Wt 197.8 lb

## 2015-07-14 DIAGNOSIS — Z1211 Encounter for screening for malignant neoplasm of colon: Secondary | ICD-10-CM

## 2015-07-14 DIAGNOSIS — E860 Dehydration: Secondary | ICD-10-CM | POA: Diagnosis not present

## 2015-07-14 DIAGNOSIS — I2510 Atherosclerotic heart disease of native coronary artery without angina pectoris: Secondary | ICD-10-CM

## 2015-07-14 DIAGNOSIS — E785 Hyperlipidemia, unspecified: Secondary | ICD-10-CM

## 2015-07-14 DIAGNOSIS — I480 Paroxysmal atrial fibrillation: Secondary | ICD-10-CM | POA: Diagnosis not present

## 2015-07-14 DIAGNOSIS — I421 Obstructive hypertrophic cardiomyopathy: Secondary | ICD-10-CM | POA: Diagnosis not present

## 2015-07-14 LAB — CBC WITH DIFFERENTIAL/PLATELET
Basophils Absolute: 0 10*3/uL (ref 0.0–0.2)
Basos: 1 %
EOS (ABSOLUTE): 0.2 10*3/uL (ref 0.0–0.4)
Eos: 3 %
Hematocrit: 48.6 % (ref 37.5–51.0)
Hemoglobin: 15.8 g/dL (ref 12.6–17.7)
Immature Grans (Abs): 0 10*3/uL (ref 0.0–0.1)
Immature Granulocytes: 0 %
Lymphocytes Absolute: 1.9 10*3/uL (ref 0.7–3.1)
Lymphs: 31 %
MCH: 31.2 pg (ref 26.6–33.0)
MCHC: 32.5 g/dL (ref 31.5–35.7)
MCV: 96 fL (ref 79–97)
Monocytes Absolute: 0.5 10*3/uL (ref 0.1–0.9)
Monocytes: 7 %
Neutrophils Absolute: 3.7 10*3/uL (ref 1.4–7.0)
Neutrophils: 58 %
Platelets: 143 10*3/uL — ABNORMAL LOW (ref 150–379)
RBC: 5.07 x10E6/uL (ref 4.14–5.80)
RDW: 14.1 % (ref 12.3–15.4)
WBC: 6.3 10*3/uL (ref 3.4–10.8)

## 2015-07-14 LAB — NMR, LIPOPROFILE
Cholesterol: 126 mg/dL (ref 100–199)
HDL Cholesterol by NMR: 48 mg/dL (ref >39)
HDL Particle Number: 31.3 umol/L (ref >=30.5)
LDL Particle Number: 594 nmol/L (ref <1000)
LDL Size: 20.7 nm (ref >20.5)
LDL-C: 54 mg/dL (ref 0–99)
LP-IR Score: 44 (ref <=45)
Small LDL Particle Number: 243 nmol/L (ref <=527)
Triglycerides by NMR: 120 mg/dL (ref 0–149)

## 2015-07-14 LAB — HEPATIC FUNCTION PANEL
ALT: 14 IU/L (ref 0–44)
AST: 26 IU/L (ref 0–40)
Albumin: 4 g/dL (ref 3.5–4.8)
Alkaline Phosphatase: 62 IU/L (ref 39–117)
Bilirubin Total: 0.6 mg/dL (ref 0.0–1.2)
Bilirubin, Direct: 0.18 mg/dL (ref 0.00–0.40)
Total Protein: 7 g/dL (ref 6.0–8.5)

## 2015-07-14 LAB — BMP8+EGFR
BUN/Creatinine Ratio: 17 (ref 10–24)
BUN: 19 mg/dL (ref 8–27)
CO2: 32 mmol/L — ABNORMAL HIGH (ref 18–29)
Calcium: 8.9 mg/dL (ref 8.6–10.2)
Chloride: 94 mmol/L — ABNORMAL LOW (ref 96–106)
Creatinine, Ser: 1.1 mg/dL (ref 0.76–1.27)
GFR calc Af Amer: 78 mL/min/{1.73_m2} (ref >59)
GFR calc non Af Amer: 68 mL/min/{1.73_m2} (ref >59)
Glucose: 64 mg/dL — ABNORMAL LOW (ref 65–99)
Potassium: 3.9 mmol/L (ref 3.5–5.2)
Sodium: 138 mmol/L (ref 134–144)

## 2015-07-14 LAB — VITAMIN D 25 HYDROXY (VIT D DEFICIENCY, FRACTURES): Vit D, 25-Hydroxy: 48.6 ng/mL (ref 30.0–100.0)

## 2015-07-14 NOTE — Patient Instructions (Signed)
Your physician recommends that you schedule a follow-up appointment in November and echo in October.

## 2015-07-16 ENCOUNTER — Encounter: Payer: Self-pay | Admitting: Cardiovascular Disease

## 2015-07-16 DIAGNOSIS — I421 Obstructive hypertrophic cardiomyopathy: Secondary | ICD-10-CM | POA: Insufficient documentation

## 2015-07-16 NOTE — Progress Notes (Signed)
Patient ID: KIRIL HIPPE, male   DOB: [birth year], 70 y.o.   MRN: [id number]      HPI: HENRY UTSEY is a 70 y.o. male presents to the office for a 6 month cardiology evaluation.  Mr. Maselli has a history of hypertrophic obstructive cardiomyopathy, CAD and is status post stenting of his proximal RCA and has distal PDA occlusion with left-to-right collaterals as well as stenting of his proximal LAD. His last cardiac catheterization in 2007 revealed both stents patent. He  has a history of paroxysmal atrial fibrillation for which she's been on chronic Coumadin therapy and did have an episode of atrial flutter but has been maintaining sinus rhythm on Norvasc therapy.  His last nuclear perfusion study in April 2012 showed normal perfusion.   In the past, he has had issues with significant peripheral edema and shortness of breath, but this has stabilized.  An echo Doppler study in April 2013 confirmed severe concentric left ventricular hypertrophy. He had a resting left ventricle outflow track subvalvular gradient of 25 mm. Ejection fraction was hyperdynamic at 70%. He did have significant mitral annular calcification with mild mitral stenosis with mild MR as well as aortic valve sclerosis with mild to moderate aortic insufficiency.  A followup echo Doppler study on 05/18/2013 was eessentially unchanged from 2 years previously.  Ejection fraction is 60-65%.  His peak LVOT velocity was 2.6 m/s, there is severe asymmetric left hypertrophy with mild systolic anterior motion of the mitral valve and severe mitral annular calcification with mild MS. There is felt to be very mild aortic stenosis and aortic insufficiency. The left atrium was moderately dilated.  His PA pressure was 32 mm.  He has a history of hyperlipidemia and an NMR lipoprotein ldone by [name] 2 years ago showed continued improvement of his lipids with an LDL particle number of 763, calculated LDL 63, HDL 44, triglycerides 113, total cholesterol  130, and small LDL particle #232.  Insulin resistance score was 46.  [name] recently completed another NMR profile yesterday.  This continued to be excellent with an LDL particle #594, total cholesterol 126, LDL cholesterol 54, triglycerides 120, small LDL particles to 43.  Insulin resistance score was 44.  Earlier this year while kayaking in [place] .  He gone on a long cardiac right and the he became significant and he became overheated.  He denied associated palpitations or presyncope.  He presents for one-year evaluation.  Past Medical History  Diagnosis Date  . Atrial fibrillation ([place])     persistent  . Coronary artery disease   . Hypercholesteremia   . Hypercholesteremia   . Iron deficiency anemia, unspecified   . Other B-complex deficiencies   . Hx of adenomatous colonic polyps 2005, 2013    Colonoscopy  . Gastritis 06-10-2006  . Hiatal hernia 06-10-2006    EGD  . Hard of hearing   . Rheumatic fever   . COPD (chronic obstructive pulmonary disease) ([place])     PFT 05-05-2010, FEV1 .9 (26%) ratio 60  . Hypertension   . Hypertrophic obstructive cardiomyopathy     mild subaortic stenosis  . LVH (left ventricular hypertrophy)   . HOH (hard of hearing)   . Cataract   . Thrombocytopenia ([place]) 05/2008  . Cirrhosis [facility])     Past Surgical History  Procedure Laterality Date  . Coronary angioplasty      2002-2003  . Shoulder surgery    . Cardioversion  May 2012    [name]  .  Cardiac catheterization  05/28/2005    Widely patent coronaries and normal LV function.  . Cardiac catheterization  08/11/2001    80% LAD stenosis successfully stented with a 3.5x27m Cypher DES postdilated to 3.973mresulting in reduction of 80% to 0%.  . Cardiac catheterization  01/15/2001    Focal 95% proximal RCA stenosis, stented with a [id number] multilink stent with postdilatation utilizing a 3.5x88m60mantum balloon with stenosis being reduced to 0%.  . Cardiovascular stress test  05/29/2011    No  scintigraphic evidence of inducible myocardial ischemia. No ECG changes. EKG negative for ischemia.  . Transthoracic echocardiogram  05/29/2011    EF >70%, severe concentric LV hypertrophy, severe mitral annular calcification, mild-moderate aortic regurg,     Allergies  Allergen Reactions  . Tetracycline Nausea And Vomiting    Current Outpatient Prescriptions  Medication Sig Dispense Refill  . acetaminophen (TYLENOL) 325 MG tablet Take 650 mg by mouth as directed.      . Cholecalciferol (VITAMIN D3) 2000 UNITS TABS Take 1 tablet by mouth daily.     . cyanocobalamin ([employer] VITAMIN B12) 1000 MCG tablet Take 1 tablet (1,000 mcg total) by mouth daily.    . disopyramide (NORPACE) 150 MG capsule Take 2 capsules (300 mg total) by mouth 2 (two) times daily. 360 capsule 0  . hydrochlorothiazide (HYDRODIURIL) 25 MG tablet Take 1 tablet (25 mg total) by mouth daily. 90 tablet 3  . mometasone (NASONEX) 50 MCG/ACT nasal spray Place 2 sprays into the nose daily. 51 g 3  . omeprazole (PRILOSEC) 10 MG capsule Take 1 capsule (10 mg total) by mouth daily. 90 capsule 3  . rosuvastatin (CRESTOR) 10 MG tablet Take 1 tablet (10 mg total) by mouth daily. 90 tablet 3  . Saccharomyces boulardii (PROBIOTIC) 250 MG CAPS Take 250 mg by mouth 3 (three) times daily.    . waMarland Kitchenfarin (COUMADIN) 4 MG tablet Take 4 mg by mouth every Wednesday. Only on Wednesday     No current facility-administered medications for this visit.    Social History   Social History  . Marital Status: Widowed    Spouse Name: N/A  . Number of Children: 5  . Years of Education: N/A   Occupational History  . retired law Event organiserSocial History Main Topics  . Smoking status: Former Smoker -- 1.00 packs/day for 10 years    Types: Cigarettes    Quit date: 02/26/1985  . Smokeless tobacco: Never Used  . Alcohol Use: No  . Drug Use: No  . Sexual Activity: Yes   Other Topics Concern  . Not on file   Social History Narrative   [place] three daughters    Family History  Problem Relation Age of Onset  . Colon cancer Paternal Grandfather   . Heart attack Brother 30  33Ventircular rupture  . Emphysema Brother   . Lung cancer Brother   . Heart disease Mother     Enlarged heart  . Stroke Mother   . Hyperlipidemia Father   . CAD Father   . CAD Paternal Uncle   . Stroke Paternal Uncle   . Cancer Maternal Grandfather     colon  . Asthma Brother    Socially he tries to remain active. He does kayak. He also does work in his yard. He is widowed. He has 4 children one deceased. One grandchild. Is no tobacco or alcohol use.  ROS General: Negative; No fevers, chills, or night sweats;  HEENT: Negative; No changes in vision or hearing, sinus congestion, difficulty swallowing Pulmonary: Negative; No cough, wheezing, shortness of breath, hemoptysis Cardiovascular: Negative; No chest pain, presyncope, syncope, palpitations GI: Negative; No nausea, vomiting, diarrhea, or abdominal pain GU: Negative; No dysuria, hematuria, or difficulty voiding Musculoskeletal: Negative; no myalgias, joint pain, or weakness Hematologic/Oncology: Positive for remote history of iron deficiency anemia no easy bruising, bleeding Endocrine: Negative; no heat/cold intolerance; no diabetes Neuro: Negative; no changes in balance, headaches Skin: Brawny lower extremity skin changes from prior chronic edema;  No rashes or skin lesions Psychiatric: Negative; No behavioral problems, depression Sleep: Negative; No snoring, daytime sleepiness, hypersomnolence, bruxism, restless legs, hypnogognic hallucinations, no cataplexy Other comprehensive 14 point system review is negative.   PE BP 132/58 mmHg  Pulse 59  Ht _0  (1.753 m)  Wt 197 lb 12.8 oz (89.721 kg)  BMI 29.20 kg/m2  Repeat blood pressure by me 148/70  Wt Readings from Last 3 Encounters:  07/14/15 197 lb 12.8 oz (89.721 kg)  07/13/15 197 lb  (89.359 kg)  05/04/15 198 lb 9.6 oz (90.084 kg)   General: Alert, oriented, no distress.  Skin: normal turgor, no rashes HEENT: Normocephalic, atraumatic. Pupils round and reactive; sclera anicteric;no lid lag.  Nose without nasal septal hypertrophy Mouth/Parynx benign; Mallinpatti scale 3 Neck: No JVD, transmitted murmur to carotids Lungs: clear to ausculatation and percussion; no wheezing or rales Heart: RRR, s1 s2 normal 2/6 systolic murmur in the aortic region and a 1/6 diastolic murmur compatible with his aortic insufficiency. Mild bisferens pulse with murmurs transmitted to his carotids. Murmur increase with the squatting position. Abdomen: Moderate central adiposity; soft, nontender; no hepatosplenomehaly, BS+; abdominal aorta nontender and not dilated by palpation. Pulses 2+ Extremities: Brawny stasis changes which are old without frank edema presently; no clubbing cyanosis, Homan's sign negative  Neurologic: grossly nonfocal Psychologic: normal affect and mood.  ECG (independently read by me): Sinus bradycardia 59 bpm.  Nonspecific intraventricular conduction delay.  T-wave changes in 3 and aVF.  April 2016 ECG (independently read by me): Sinus bradycardia at 56 bpm.  First-degree AV block.  Left bundle branch block.  October 2015 ECG (independently read by me): Sinus rhythm at 60 beats per minute with first degree AV block with a PR interval of 236 ms.  LDH with QRS widening.  QTC 514 ms.  Prior 06/15/2013 ECG (independently read by me and (sinus rhythm at 59 beats per minute.  First degree AV block with PR interval 220 ms.  Increased QTC of 500 ms.  Interventricular conduction delay.  Prior 10/ 22/2014 ECG: Sinus rhythm with first-degree AV block with PR interval 232 ms. QT interval 495 ms.  LABS:  BMP Latest Ref Rng 07/13/2015 02/23/2015 01/21/2015  Glucose 65 - 99 mg/dL 64(L) 80 70  BUN 8 - 27 mg/dL _1 Creatinine 0.76 - 1.27 mg/dL 1.10 1.11 1.19  BUN/Creat Ratio 10  - _2 Sodium 134 - 144 mmol/L 138 138 143  Potassium 3.5 - 5.2 mmol/L 3.9 3.6 4.6  Chloride 96 - 106 mmol/L 94(L) 99 98  CO2 18 - 29 mmol/L 32(H) 28 31(H)  Calcium 8.6 - 10.2 mg/dL 8.9 8.7 8.6    Hepatic Function Latest Ref Rng 07/13/2015 02/23/2015 01/21/2015  Total Protein 6.0 - 8.5 g/dL 7.0 6.9 5.5(L)  Albumin 3.5 - 4.8 g/dL 4.0 3.8 3.0(L)  AST 0 - 40 IU/L 26 25 53(H)  ALT 0 - 44 IU/L 14 19 47(H)  Alk Phosphatase 39 - 117 IU/L 62 66 76  Total Bilirubin 0.0 - 1.2 mg/dL 0.6 0.5 0.2  Bilirubin, Direct 0.00 - 0.40 mg/dL 0.18 0.18 0.10     CBC Latest Ref Rng 07/13/2015 02/23/2015 01/21/2015  WBC 3.4 - 10.8 x10E3/uL 6.3 6.2 7.0  Hemoglobin 13.0 - 17.0 g/dL - - -  Hematocrit 37.5 - 51.0 % 48.6 44.6 46.2  Platelets 150 - 379 x10E3/uL 143(L) 141(L) 164   Lab Results  Component Value Date   MCV 96 07/13/2015   MCV 94 02/23/2015   MCV 95 01/21/2015    No results found for: HGBA1C   Lab Results  Component Value Date   TSH 2.620 01/29/2014    Lab Results  Component Value Date   TSH 2.620 01/29/2014       Component Value Date/Time   PROBNP 541.0* 05/05/2010 1158    Lipid Panel     Component Value Date/Time   CHOL 126 07/13/2015 1101   CHOL 126 08/20/2012 0930   TRIG 120 07/13/2015 1101   TRIG 94 08/20/2012 0930   HDL 48 07/13/2015 1101   HDL 41 08/20/2012 0930   LDLCALC 63 09/29/2013 0907   LDLCALC 66 08/20/2012 0930     RADIOLOGY: No results found.     ASSESSMENT AND PLAN: Mr. Nethaniel Mattie is a 70 year old male who has a history of hypertrophic obstructive cardiomyopathy and his gradients have remained stable on subsequent echocardiographic evaluations.  He  is status post two-vessel coronary intervention and at last catheterization a patent stents in his LAD and proximal right coronary artery. He is maintaining sinus rhythm on Norpace 150 mg twice a day and has not had any breakthrough recurrent atrial fibrillation or atrial flutter. He also is on  verapamil 120 g daily. His blood pressure today is controlled.  His last nuclear perfusion study in April 2013 continued to show fairly normal perfusion.  Presently, he denies any anginal symptoms.  During a prolonged [place]  He became dehydrated could not return and required an adjacent bolt take him back.  He denies any increasing shortness of breath.  There are no signs of significant edema.  He is not having any arrhythmia and is doing well with Norpace. There has not been any recurrent PAF.  It is my recommendation that he continue to take Norpace which is beneficial for his hypertrophic obstructive Carter myopathy.  I do not believe he is a  candidate for amiodarone due to prior lung issues.  He continues to be on warfarin for anticoagulation. I reviewed his recent blood work done yesterday.  His NMR LipoProfile is excellent.  He is tolerating low-dose Crestor 10 mg.  I am recommending that he have a repeat echo Doppler study in 5-6 months for follow-up evaluation of his HOCM.  I will see him in the office in 6 months for cardiology reevaluation.  Time spent: 25 minutes  Troy Sine, MD, [facility]  07/16/2015 12:29 PM

## 2015-07-17 LAB — FECAL OCCULT BLOOD, IMMUNOCHEMICAL: Fecal Occult Bld: NEGATIVE

## 2015-07-28 ENCOUNTER — Telehealth: Payer: Self-pay | Admitting: *Deleted

## 2015-07-28 NOTE — Telephone Encounter (Signed)
Called patient to inform him per Dr Claiborne Billings to continue the Disopyramide 150mg . He DOES NOT want the patient switched to amiodarone. patient voiced understanding.

## 2015-08-10 ENCOUNTER — Ambulatory Visit (INDEPENDENT_AMBULATORY_CARE_PROVIDER_SITE_OTHER): Payer: Medicare Other | Admitting: Pharmacist

## 2015-08-10 DIAGNOSIS — I482 Chronic atrial fibrillation, unspecified: Secondary | ICD-10-CM

## 2015-08-10 DIAGNOSIS — I4891 Unspecified atrial fibrillation: Secondary | ICD-10-CM | POA: Diagnosis not present

## 2015-08-10 LAB — COAGUCHEK XS/INR WAIVED
INR: 2 — ABNORMAL HIGH (ref 0.9–1.1)
Prothrombin Time: 23.5 s

## 2015-08-10 NOTE — Patient Instructions (Signed)
Anticoagulation Dose Instructions as of 08/10/2015      Joshua [name] Tue Wed Thu Fri Sat   New Dose 4 mg 6 mg 4 mg 4 mg 4 mg 6 mg 4 mg    Description        Continue current warfarin 4mg  - take 1 and 1/2 tablets on mondays and fridays.  Take 1 tablet all other days.       INR was 2.0 today

## 2015-09-20 ENCOUNTER — Ambulatory Visit (INDEPENDENT_AMBULATORY_CARE_PROVIDER_SITE_OTHER): Payer: Medicare Other | Admitting: Pharmacist

## 2015-09-20 DIAGNOSIS — I4891 Unspecified atrial fibrillation: Secondary | ICD-10-CM

## 2015-09-20 LAB — COAGUCHEK XS/INR WAIVED
INR: 1.8 — ABNORMAL HIGH (ref 0.9–1.1)
Prothrombin Time: 21.3 s

## 2015-10-13 ENCOUNTER — Ambulatory Visit (INDEPENDENT_AMBULATORY_CARE_PROVIDER_SITE_OTHER): Payer: Medicare Other | Admitting: Family

## 2015-10-13 ENCOUNTER — Encounter: Payer: Self-pay | Admitting: Family

## 2015-10-13 VITALS — BP 131/61 | HR 54 | Temp 97.0°F | Ht 69.0 in | Wt 194.2 lb

## 2015-10-13 DIAGNOSIS — N3001 Acute cystitis with hematuria: Secondary | ICD-10-CM

## 2015-10-13 DIAGNOSIS — R309 Painful micturition, unspecified: Secondary | ICD-10-CM

## 2015-10-13 LAB — URINALYSIS, COMPLETE
Bilirubin, UA: NEGATIVE
Glucose, UA: NEGATIVE
Ketones, UA: NEGATIVE
Nitrite, UA: NEGATIVE
Specific Gravity, UA: 1.025 (ref 1.005–1.030)
Urobilinogen, Ur: 0.2 mg/dL (ref 0.2–1.0)
pH, UA: 5 (ref 5.0–7.5)

## 2015-10-13 LAB — MICROSCOPIC EXAMINATION: WBC, UA: >30 /hpf — AB (ref 0–?)

## 2015-10-13 MED ORDER — NITROFURANTOIN MONOHYD MACRO 100 MG PO CAPS: 100.0000 mg | ORAL_CAPSULE | Freq: Two times a day (BID) | ORAL | 0 refills | Status: DC

## 2015-10-13 NOTE — Addendum Note (Signed)
Addended by: Evelina Dun A on: 10/13/2015 10:35 AM   Modules accepted: Orders

## 2015-10-13 NOTE — Progress Notes (Signed)
   Subjective:    Patient ID: Joshua [name], male    DOB: 09-Feb-1946, 69 y.o.   MRN: [id number]  Dysuria   This is a new problem. The current episode started in the past 7 days. The problem occurs every urination. The problem has been unchanged. The quality of the pain is described as burning. The pain is at a severity of 6/10. The pain is mild. There has been no fever. Associated symptoms include frequency, hesitancy and urgency. Pertinent negatives include no hematuria, nausea or vomiting. He has tried increased fluids for the symptoms. The treatment provided no relief.      Review of Systems  Respiratory: Negative.   Cardiovascular: Negative.   Gastrointestinal: Negative.  Negative for nausea and vomiting.  Genitourinary: Positive for dysuria, frequency, hesitancy and urgency. Negative for hematuria.  All other systems reviewed and are negative.      Objective:   Physical Exam  Constitutional: He is oriented to person, place, and time. He appears well-developed and well-nourished. No distress.  HENT:  Head: Normocephalic.  Eyes: Pupils are equal, round, and reactive to light. Right eye exhibits no discharge. Left eye exhibits no discharge.  Neck: Normal range of motion. Neck supple. No thyromegaly present.  Cardiovascular: Normal rate, regular rhythm, normal heart sounds and intact distal pulses.   No murmur heard. Pulmonary/Chest: Effort normal and breath sounds normal. No respiratory distress. He has no wheezes.  Abdominal: Soft. Bowel sounds are normal. He exhibits no distension. There is no tenderness.  Musculoskeletal: Normal range of motion. He exhibits no edema or tenderness.  Negative for CVA tenderness  Neurological: He is alert and oriented to person, place, and time.  Skin: Skin is warm and dry. No rash noted. No erythema.  Psychiatric: He has a normal mood and affect. His behavior is normal. Judgment and thought content normal.  Vitals reviewed.     BP 131/61    Pulse (!) 54   Temp 97 F (36.1 C) (Oral)   Ht 5\' 9"  (1.753 m)   Wt 194 lb 3.2 oz (88.1 kg)   BMI 28.68 kg/m      Assessment & Plan:  1. Pain on voiding - Urinalysis, Complete - Urine culture - nitrofurantoin, macrocrystal-monohydrate, (MACROBID) 100 MG capsule; Take 1 capsule (100 mg total) by mouth 2 (two) times daily.  Dispense: 10 capsule; Refill: 0  2. Acute cystitis with hematuria -Force fluids AZO over the counter X2 days RTO prn Culture pending - Urine culture - nitrofurantoin, macrocrystal-monohydrate, (MACROBID) 100 MG capsule; Take 1 capsule (100 mg total) by mouth 2 (two) times daily.  Dispense: 10 capsule; Refill: 0  Evelina Dun, FNP

## 2015-10-13 NOTE — Patient Instructions (Signed)

## 2015-10-15 LAB — URINE CULTURE

## 2015-10-21 ENCOUNTER — Ambulatory Visit (INDEPENDENT_AMBULATORY_CARE_PROVIDER_SITE_OTHER): Payer: Medicare Other | Admitting: Pharmacist Clinician (PhC)/ Clinical Pharmacy Specialist

## 2015-10-21 DIAGNOSIS — I4891 Unspecified atrial fibrillation: Secondary | ICD-10-CM

## 2015-10-21 DIAGNOSIS — I482 Chronic atrial fibrillation, unspecified: Secondary | ICD-10-CM

## 2015-10-21 LAB — COAGUCHEK XS/INR WAIVED
INR: 1.7 — ABNORMAL HIGH (ref 0.9–1.1)
Prothrombin Time: 20.6 s

## 2015-10-21 NOTE — Patient Instructions (Signed)
Anticoagulation Dose Instructions as of 10/21/2015      Joshua [name] Tue Wed Thu Fri Sat   New Dose 4 mg 6 mg 4 mg 6 mg 4 mg 6 mg 4 mg    Description   Change coumadin directions to taking 1 1/2 tablet on Mondays, Wednesdays and Fridays.  Take 2 tablets today only.

## 2015-10-27 ENCOUNTER — Other Ambulatory Visit: Payer: Self-pay | Admitting: Family Medicine

## 2015-11-01 ENCOUNTER — Other Ambulatory Visit: Payer: Self-pay | Admitting: Family Medicine

## 2015-11-03 ENCOUNTER — Telehealth: Payer: Self-pay | Admitting: Family Medicine

## 2015-11-11 ENCOUNTER — Ambulatory Visit (INDEPENDENT_AMBULATORY_CARE_PROVIDER_SITE_OTHER): Payer: Medicare Other | Admitting: Pharmacist Clinician (PhC)/ Clinical Pharmacy Specialist

## 2015-11-11 DIAGNOSIS — I4891 Unspecified atrial fibrillation: Secondary | ICD-10-CM

## 2015-11-11 DIAGNOSIS — I482 Chronic atrial fibrillation, unspecified: Secondary | ICD-10-CM

## 2015-11-11 LAB — COAGUCHEK XS/INR WAIVED
INR: 2.2 — ABNORMAL HIGH (ref 0.9–1.1)
Prothrombin Time: 26.4 s

## 2015-11-11 NOTE — Patient Instructions (Signed)
Anticoagulation Dose Instructions as of 11/11/2015      Joshua [name] Tue Wed Thu Fri Sat   New Dose 4 mg 6 mg 4 mg 6 mg 4 mg 6 mg 4 mg    Description   Continue taking 1 1/2 tablet on Mondays, Wednesdays and Fridays.   INR today 2.2

## 2015-12-02 ENCOUNTER — Encounter: Payer: Self-pay | Admitting: Family Medicine

## 2015-12-02 ENCOUNTER — Ambulatory Visit (INDEPENDENT_AMBULATORY_CARE_PROVIDER_SITE_OTHER): Payer: Medicare Other | Admitting: Family Medicine

## 2015-12-02 VITALS — BP 128/54 | HR 48 | Temp 96.6°F | Ht 69.0 in | Wt 193.0 lb

## 2015-12-02 DIAGNOSIS — Z23 Encounter for immunization: Secondary | ICD-10-CM | POA: Diagnosis not present

## 2015-12-02 DIAGNOSIS — I421 Obstructive hypertrophic cardiomyopathy: Secondary | ICD-10-CM | POA: Diagnosis not present

## 2015-12-02 DIAGNOSIS — D692 Other nonthrombocytopenic purpura: Secondary | ICD-10-CM

## 2015-12-02 DIAGNOSIS — I10 Essential (primary) hypertension: Secondary | ICD-10-CM

## 2015-12-02 DIAGNOSIS — I482 Chronic atrial fibrillation, unspecified: Secondary | ICD-10-CM

## 2015-12-02 DIAGNOSIS — I4891 Unspecified atrial fibrillation: Secondary | ICD-10-CM

## 2015-12-02 DIAGNOSIS — D696 Thrombocytopenia, unspecified: Secondary | ICD-10-CM

## 2015-12-02 DIAGNOSIS — E785 Hyperlipidemia, unspecified: Secondary | ICD-10-CM

## 2015-12-02 DIAGNOSIS — K7460 Unspecified cirrhosis of liver: Secondary | ICD-10-CM

## 2015-12-02 DIAGNOSIS — E559 Vitamin D deficiency, unspecified: Secondary | ICD-10-CM

## 2015-12-02 LAB — COAGUCHEK XS/INR WAIVED
INR: 2.9 — ABNORMAL HIGH (ref 0.9–1.1)
Prothrombin Time: 34.8 s

## 2015-12-02 NOTE — Patient Instructions (Addendum)
Medicare Annual Wellness Visit  [place] and the medical providers at [facility] strive to bring you the best medical care.  In doing so we not only want to address your current medical conditions and concerns but also to detect new conditions early and prevent illness, disease and health-related problems.    Medicare offers a yearly Wellness Visit which allows our clinical staff to assess your need for preventative services including immunizations, lifestyle education, counseling to decrease risk of preventable diseases and screening for fall risk and other medical concerns.    This visit is provided free of charge (no copay) for all Medicare recipients. The clinical pharmacists at [facility] have begun to conduct these Wellness Visits which will also include a thorough review of all your medications.    As you primary medical provider recommend that you make an appointment for your Annual Wellness Visit if you have not done so already this year.  You may set up this appointment before you leave today or you may call back [phone]) and schedule an appointment.  Please make sure when you call that you mention that you are scheduling your Annual Wellness Visit with the clinical pharmacist so that the appointment may be made for the proper length of time.     Continue current medications. Continue good therapeutic lifestyle changes which include good diet and exercise. Fall precautions discussed with patient. If an FOBT was given today- please return it to our front desk. If you are over 71 years old - you may need Prevnar 8 or the adult Pneumonia vaccine.  **Flu shots are available--- please call and schedule a FLU-CLINIC appointment**  After your visit with [place] today you will receive a survey in the mail or online from [employer] regarding your care with [place]. Please take a moment to fill this out. Your feedback is very  important to [place] as you can help [place] better understand your patient needs as well as improve your experience and satisfaction. WE CARE ABOUT YOU!!!   Continue to follow-up regularly with the cardiologist as planned every 6 months, Dr. Claiborne Billings The flu shot today you'll receive today may make your arm sore Stay active physically and always drink plenty of fluids so he does not get dehydrated Continue with respiratory protection Take Tylenol if needed for pain

## 2015-12-02 NOTE — Addendum Note (Signed)
Addended by: Liliane Bade on: 12/02/2015 09:24 AM   Modules accepted: Orders

## 2015-12-02 NOTE — Progress Notes (Signed)
Subjective:    Patient ID: Joshua [name], male    DOB: 1945/03/14, 70 y.o.   MRN: [id number]  HPI Pt here for follow up and management of chronic medical problems which includes a fib and hyperlipidemia. He is taking medications regularly.The patient is doing well overall. He was in the office sometime in August with a urinary tract infection. He saw his cardiologist, Dr. Claiborne Billings back in May and the cardiologist felt that everything was stable with his hypertrophic obstructive cardiomyopathy and had planned an echocardiogram 5-6 months after that visit. He sees him every 6 months. The patient is due lab work today and will get a flu shot today. He is complaining of some right side rib pain. His weight is stable and down 1 pound since the last visit. The patient is better with his urinary tract symptoms. The right sided pain always comes if he lays on his right side and is most likely musculoskeletal. The patient denies any chest pain palpitations or shortness of breath unless he is dehydrated and very active he may have shortness of breath. He denies any problem with his GI tract other than some occasional constipation. He does not have any heartburn he does take omeprazole. He takes MiraLAX for his constipation. He has not seen any blood in the stool or had any black tarry bowel movements. He is passing his water well now and no further symptoms of the urinary tract. He is active physically working with [employer], likes to kayak, and stays busy looking after his chickens.    Patient Active Problem List   Diagnosis Date Noted  . HOCM (hypertrophic obstructive cardiomyopathy) ([place]) 07/16/2015  . Hepatic cirrhosis ([place])   . Syncope and collapse 01/14/2015  . Lower GI bleeding 01/14/2015  . Elevated INR 01/14/2015  . Arterial hypotension   . Erectile dysfunction 11/10/2014  . Thrombocytopenia ([place]) 01/30/2014  . Hyperlipidemia LDL goal <70 12/03/2013  . High risk medication use 05/21/2013   . BPH (benign prostatic hyperplasia) 01/29/2013  . Family history of colon cancer 01/29/2013  . PAF (paroxysmal atrial fibrillation) ([place]) 12/17/2012  . Aortic valve insufficiency 12/17/2012  . Mild mitral stenosis 12/17/2012  . Pulmonary infiltrates 07/17/2010  . COPD UNSPECIFIED 05/05/2010  . Hypertrophic obstructive cardiomyopathy ([place]) 03/31/2010  . ATRIAL FLUTTER 03/31/2010  . B12 deficiency 10/11/2008  . ANEMIA, IRON DEFICIENCY 10/11/2008  . Coronary atherosclerosis 10/11/2008  . FIBRILLATION, ATRIAL 10/11/2008  . HIATAL HERNIA WITH REFLUX 10/11/2008  . COLONIC POLYPS, ADENOMATOUS, HX OF 10/11/2008   Outpatient Encounter Prescriptions as of 12/02/2015  Medication Sig  . acetaminophen (TYLENOL) 325 MG tablet Take 650 mg by mouth as directed.    . cholecalciferol (VITAMIN D) 1000 units tablet Take 1,000 Units by mouth daily.  . cyanocobalamin (CVS VITAMIN B12) 1000 MCG tablet Take 1 tablet (1,000 mcg total) by mouth daily.  . disopyramide (NORPACE) 150 MG capsule TAKE 2 CAPSULES BY MOUTH TWICE DAILY  . hydrochlorothiazide (HYDRODIURIL) 25 MG tablet TAKE 1 TABLET BY MOUTH DAILY  . JANTOVEN 4 MG tablet TAKE 1 TABLET BY MOUTH DAILY, EXCEPT ON TUES ANDFRIDAY TAKE 1 AND 1/2TAB  . mometasone (NASONEX) 50 MCG/ACT nasal spray Place 2 sprays into the nose daily.  Marland Kitchen omeprazole (PRILOSEC) 10 MG capsule Take 1 capsule (10 mg total) by mouth daily.  . rosuvastatin (CRESTOR) 10 MG tablet TAKE 1 TABLET BY MOUTH DAILY  . Saccharomyces boulardii (PROBIOTIC) 250 MG CAPS Take 250 mg by mouth 3 (three) times daily.  . [  DISCONTINUED] Cholecalciferol (VITAMIN D3) 2000 UNITS TABS Take 1 tablet by mouth daily.   . [DISCONTINUED] nitrofurantoin, macrocrystal-monohydrate, (MACROBID) 100 MG capsule Take 1 capsule (100 mg total) by mouth 2 (two) times daily.   No facility-administered encounter medications on file as of 12/02/2015.       Review of Systems  Constitutional: Negative.   HENT: Negative.     Eyes: Negative.   Respiratory: Negative.   Cardiovascular: Negative.   Gastrointestinal: Negative.   Endocrine: Negative.   Genitourinary: Negative.   Musculoskeletal: Positive for back pain (rib area - right side pain).  Skin: Negative.   Allergic/Immunologic: Negative.   Neurological: Negative.   Hematological: Negative.   Psychiatric/Behavioral: Negative.        Objective:   Physical Exam  Constitutional: He is oriented to person, place, and time. He appears well-developed and well-nourished. No distress.  HENT:  Head: Normocephalic and atraumatic.  Right Ear: External ear normal.  Left Ear: External ear normal.  Nose: Nose normal.  Mouth/Throat: Oropharynx is clear and moist. No oropharyngeal exudate.  The patient wears bilateral hearing aids Nose nasal irritation noted on left nasal septum. Patient reports occasional bleeding from the left side.  Eyes: Conjunctivae and EOM are normal. Pupils are equal, round, and reactive to light. Right eye exhibits no discharge. Left eye exhibits no discharge. No scleral icterus.  Neck: Normal range of motion. Neck supple. No thyromegaly present.  No bruits or thyromegaly or anterior cervical adenopathy  Cardiovascular: Normal rate, regular rhythm and intact distal pulses.   Murmur heard. The heart was regular with a grade 2/6 systolic ejection murmur at 60/m  Pulmonary/Chest: Effort normal and breath sounds normal. No respiratory distress. He has no wheezes. He has no rales. He exhibits no tenderness.  Clear anteriorly and posteriorly  Abdominal: Soft. Bowel sounds are normal. He exhibits no mass. There is no tenderness. There is no rebound and no guarding.  Nontender without masses or organ enlargement or bruits no inguinal adenopathy  Musculoskeletal: Normal range of motion. He exhibits no edema.  Lymphadenopathy:    He has no cervical adenopathy.  Neurological: He is alert and oriented to person, place, and time. He has normal  reflexes. No cranial nerve deficit.  Skin: Skin is warm and dry. No rash noted.  Psychiatric: He has a normal mood and affect. His behavior is normal. Judgment and thought content normal.  Nursing note and vitals reviewed.  BP (!) 128/54 (BP Location: Left Arm)   Pulse (!) 48   Temp (!) 96.6 F (35.9 C) (Oral)   Ht 5' 9"  (1.753 m)   Wt 193 lb (87.5 kg)   BMI 28.50 kg/m         Assessment & Plan:  1. Vitamin D deficiency -Continue with current vitamin D pending results of lab work - CBC with Differential/Platelet - VITAMIN D 25 Hydroxy (Vit-D Deficiency, Fractures)  2. Essential hypertension -The blood pressure today is good and he will continue with current treatment - BMP8+EGFR - CBC with Differential/Platelet - Hepatic function panel  3. Hyperlipidemia, unspecified hyperlipidemia type -Continue with Crestor pending results of lab work - CBC with Differential/Platelet - NMR, lipoprofile  4. Thrombocytopenia ([place]) -The patient describes no bleeding problems. He does have senile purpura. - CBC with Differential/Platelet  5. Atrial fibrillation, unspecified type ([place]) -The heart was regular today and he will continue to follow-up with Dr. Claiborne Billings as recommended - CBC with Differential/Platelet  6. Hepatic cirrhosis, unspecified hepatic cirrhosis type ([place])  7. Senile purpura ([place]) -He continues to have bruising on both arms.  8. Hypertrophic obstructive cardiomyopathy ([place]) -Continue to follow-up with Dr. Claiborne Billings.  Patient Instructions                       Medicare Annual Wellness Visit  [facility] and the medical providers at [facility] strive to bring you the best medical care.  In doing so we not only want to address your current medical conditions and concerns but also to detect new conditions early and prevent illness, disease and health-related problems.    Medicare offers a yearly Wellness Visit which allows our clinical staff to  assess your need for preventative services including immunizations, lifestyle education, counseling to decrease risk of preventable diseases and screening for fall risk and other medical concerns.    This visit is provided free of charge (no copay) for all Medicare recipients. The clinical pharmacists at [facility] have begun to conduct these Wellness Visits which will also include a thorough review of all your medications.    As you primary medical provider recommend that you make an appointment for your Annual Wellness Visit if you have not done so already this year.  You may set up this appointment before you leave today or you may call back ([phone]) and schedule an appointment.  Please make sure when you call that you mention that you are scheduling your Annual Wellness Visit with the clinical pharmacist so that the appointment may be made for the proper length of time.     Continue current medications. Continue good therapeutic lifestyle changes which include good diet and exercise. Fall precautions discussed with patient. If an FOBT was given today- please return it to our front desk. If you are over 69 years old - you may need Prevnar 68 or the adult Pneumonia vaccine.  **Flu shots are available--- please call and schedule a FLU-CLINIC appointment**  After your visit with [place] today you will receive a survey in the mail or online from [employer] regarding your care with [place]. Please take a moment to fill this out. Your feedback is very important to [place] as you can help [place] better understand your patient needs as well as improve your experience and satisfaction. WE CARE ABOUT YOU!!!   Continue to follow-up regularly with the cardiologist as planned every 6 months, Dr. Claiborne Billings The flu shot today you'll receive today may make your arm sore Stay active physically and always drink plenty of fluids so he does not get dehydrated Continue with respiratory protection Take Tylenol  if needed for pain     Arrie Senate MD

## 2015-12-03 LAB — CBC WITH DIFFERENTIAL/PLATELET
Basophils Absolute: 0 10*3/uL (ref 0.0–0.2)
Basos: 1 %
EOS (ABSOLUTE): 0.2 10*3/uL (ref 0.0–0.4)
Eos: 2 %
Hematocrit: 48.3 % (ref 37.5–51.0)
Hemoglobin: 15.8 g/dL (ref 12.6–17.7)
Immature Grans (Abs): 0 10*3/uL (ref 0.0–0.1)
Immature Granulocytes: 1 %
Lymphocytes Absolute: 1.7 10*3/uL (ref 0.7–3.1)
Lymphs: 26 %
MCH: 31.4 pg (ref 26.6–33.0)
MCHC: 32.7 g/dL (ref 31.5–35.7)
MCV: 96 fL (ref 79–97)
Monocytes Absolute: 0.5 10*3/uL (ref 0.1–0.9)
Monocytes: 8 %
Neutrophils Absolute: 4.2 10*3/uL (ref 1.4–7.0)
Neutrophils: 62 %
Platelets: 131 10*3/uL — ABNORMAL LOW (ref 150–379)
RBC: 5.03 x10E6/uL (ref 4.14–5.80)
RDW: 14.4 % (ref 12.3–15.4)
WBC: 6.6 10*3/uL (ref 3.4–10.8)

## 2015-12-03 LAB — NMR, LIPOPROFILE
Cholesterol: 133 mg/dL (ref 100–199)
HDL Cholesterol by NMR: 47 mg/dL (ref >39)
HDL Particle Number: 31.4 umol/L (ref >=30.5)
LDL Particle Number: 681 nmol/L (ref <1000)
LDL Size: 20.7 nm (ref >20.5)
LDL-C: 65 mg/dL (ref 0–99)
LP-IR Score: 36 (ref <=45)
Small LDL Particle Number: 323 nmol/L (ref <=527)
Triglycerides by NMR: 107 mg/dL (ref 0–149)

## 2015-12-03 LAB — BMP8+EGFR
BUN/Creatinine Ratio: 19 (ref 10–24)
BUN: 22 mg/dL (ref 8–27)
CO2: 32 mmol/L — ABNORMAL HIGH (ref 18–29)
Calcium: 8.7 mg/dL (ref 8.6–10.2)
Chloride: 96 mmol/L (ref 96–106)
Creatinine, Ser: 1.18 mg/dL (ref 0.76–1.27)
GFR calc Af Amer: 71 mL/min/{1.73_m2} (ref >59)
GFR calc non Af Amer: 62 mL/min/{1.73_m2} (ref >59)
Glucose: 74 mg/dL (ref 65–99)
Potassium: 3.8 mmol/L (ref 3.5–5.2)
Sodium: 142 mmol/L (ref 134–144)

## 2015-12-03 LAB — HEPATIC FUNCTION PANEL
ALT: 21 IU/L (ref 0–44)
AST: 36 IU/L (ref 0–40)
Albumin: 4 g/dL (ref 3.5–4.8)
Alkaline Phosphatase: 60 IU/L (ref 39–117)
Bilirubin Total: 0.5 mg/dL (ref 0.0–1.2)
Bilirubin, Direct: 0.17 mg/dL (ref 0.00–0.40)
Total Protein: 7.1 g/dL (ref 6.0–8.5)

## 2015-12-03 LAB — VITAMIN D 25 HYDROXY (VIT D DEFICIENCY, FRACTURES): Vit D, 25-Hydroxy: 45.5 ng/mL (ref 30.0–100.0)

## 2015-12-12 ENCOUNTER — Ambulatory Visit (HOSPITAL_COMMUNITY): Payer: Medicare Other | Attending: Cardiology

## 2015-12-12 ENCOUNTER — Other Ambulatory Visit: Payer: Self-pay

## 2015-12-12 DIAGNOSIS — I5030 Unspecified diastolic (congestive) heart failure: Secondary | ICD-10-CM | POA: Insufficient documentation

## 2015-12-12 DIAGNOSIS — I421 Obstructive hypertrophic cardiomyopathy: Secondary | ICD-10-CM | POA: Diagnosis not present

## 2015-12-12 DIAGNOSIS — I270 Primary pulmonary hypertension: Secondary | ICD-10-CM | POA: Diagnosis not present

## 2015-12-12 DIAGNOSIS — I2510 Atherosclerotic heart disease of native coronary artery without angina pectoris: Secondary | ICD-10-CM

## 2015-12-12 DIAGNOSIS — I517 Cardiomegaly: Secondary | ICD-10-CM | POA: Diagnosis not present

## 2015-12-19 ENCOUNTER — Other Ambulatory Visit: Payer: Self-pay | Admitting: Family Medicine

## 2015-12-23 ENCOUNTER — Encounter: Payer: Self-pay | Admitting: Pharmacist Clinician (PhC)/ Clinical Pharmacy Specialist

## 2016-01-05 ENCOUNTER — Encounter: Payer: Self-pay | Admitting: Pharmacist

## 2016-01-05 ENCOUNTER — Ambulatory Visit (INDEPENDENT_AMBULATORY_CARE_PROVIDER_SITE_OTHER): Payer: Medicare Other | Admitting: Pharmacist

## 2016-01-05 VITALS — BP 138/62 | HR 54 | Ht 69.0 in | Wt 196.5 lb

## 2016-01-05 DIAGNOSIS — Z0000 Encounter for general adult medical examination without abnormal findings: Secondary | ICD-10-CM

## 2016-01-05 DIAGNOSIS — I4891 Unspecified atrial fibrillation: Secondary | ICD-10-CM

## 2016-01-05 LAB — COAGUCHEK XS/INR WAIVED
INR: 2.5 — ABNORMAL HIGH (ref 0.9–1.1)
Prothrombin Time: 29.6 s

## 2016-01-05 NOTE — Patient Instructions (Signed)
  [name] , Thank you for taking time to come for your Medicare Wellness Visit. I appreciate your ongoing commitment to your health goals. Please review the following plan we discussed and let me know if I can assist you in the future.   These are the goals we discussed:  Look for copy of [place] (important to know where these are kept - you can also bring copy to our office to be placed in our file / electronic chart)  Try in increase physical activity - goal is 150 minutes per week.  - walking, biking (real or stationary), yoga.  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    This is a list of the screening recommended for you and due dates:  Health Maintenance  Topic Date Due  . Stool Blood Test  07/13/2016  . Colon Cancer Screening  10/23/2016  . Tetanus Vaccine  09/26/2020  . Flu Shot  Completed  . Shingles Vaccine  Completed  .  Hepatitis C: One time screening is recommended by Center for Disease Control  (CDC) for  adults born from [birth year] through 1966.   Completed  . Pneumonia vaccines  Completed

## 2016-01-05 NOTE — Progress Notes (Signed)
Patient ID: Joshua [name], male   DOB: 01/25/1946, 70 y.o.   MRN: [id number]    Subjective:   Joshua [name] is a 70 y.o. male here for subsequent Medicare Annual Wellness Visit and to have INR check due to chronic anticoagulation therapy with warfarin.   Joshua [name] is widowed and lives by himself in [place], [place].  He is retired. He has 3 daughters and 2 sons (1 son is deceased Development worker, international aid)  His mother in law is in assisted living in [place], [place] but he continues to visit and care for her regularly.  Denies any dizziness or s/s of bleeding.  No complaints regarding health today.    Outpatient Encounter Prescriptions as of 01/05/2016  Medication Sig  . acetaminophen (TYLENOL) 325 MG tablet Take 650 mg by mouth as needed.   . cholecalciferol (VITAMIN D) 1000 units tablet Take 1,000 Units by mouth daily.  . cyanocobalamin (CVS VITAMIN B12) 1000 MCG tablet Take 1 tablet (1,000 mcg total) by mouth daily.  . disopyramide (NORPACE) 150 MG capsule TAKE 2 CAPSULES BY MOUTH TWICE DAILY  . hydrochlorothiazide (HYDRODIURIL) 25 MG tablet TAKE 1 TABLET BY MOUTH DAILY  . JANTOVEN 4 MG tablet TAKE 1 TABLET BY MOUTH DAILY, EXCEPT ON TUES ANDFRIDAY TAKE 1 AND 1/2TAB  . mometasone (NASONEX) 50 MCG/ACT nasal spray Place 2 sprays into the nose daily. (Patient taking differently: Place 2 sprays into the nose daily as needed. )  . omeprazole (PRILOSEC) 10 MG capsule TAKE 1 CAPSULE BY MOUTH DAILY  . polyethylene glycol (MIRALAX / GLYCOLAX) packet Take 17 g by mouth every other day.  . rosuvastatin (CRESTOR) 10 MG tablet TAKE 1 TABLET BY MOUTH DAILY  . [DISCONTINUED] Saccharomyces boulardii (PROBIOTIC) 250 MG CAPS Take 250 mg by mouth 3 (three) times daily.   No facility-administered encounter medications on file as of 01/05/2016.     Allergies (verified) Tetracycline   History: Past Medical History:  Diagnosis Date  . Acute lower GI bleeding    related to gastritis / viral infection  . Atrial fibrillation  ([place])    persistent  . Cataract   . Cirrhosis ([place])   . COPD (chronic obstructive pulmonary disease) ([place])    PFT 05-05-2010, FEV1 .9 (26%) ratio 60  . Coronary artery disease   . Gastritis 06-10-2006  . Hard of hearing   . Hiatal hernia 06-10-2006   EGD  . HOH (hard of hearing)   . Hx of adenomatous colonic polyps 2005, 2013   Colonoscopy  . Hypercholesteremia   . Hypercholesteremia   . Hypertension   . Hypertr obst cardiomyop    mild subaortic stenosis  . Iron deficiency anemia, unspecified   . LVH (left ventricular hypertrophy)   . Other B-complex deficiencies   . Rheumatic fever   . Thrombocytopenia ([place]) 05/2008   Past Surgical History:  Procedure Laterality Date  . CARDIAC CATHETERIZATION  05/28/2005   Widely patent coronaries and normal LV function.  Marland Kitchen CARDIAC CATHETERIZATION  08/11/2001   80% LAD stenosis successfully stented with a 3.5x106mm Cypher DES postdilated to 3.43mm resulting in reduction of 80% to 0%.  . CARDIAC CATHETERIZATION  01/15/2001   Focal 95% proximal RCA stenosis, stented with a 3x34mm Zeta multilink stent with postdilatation utilizing a 3.5x85mm Quantum balloon with stenosis being reduced to 0%.  . CARDIOVASCULAR STRESS TEST  05/29/2011   No scintigraphic evidence of inducible myocardial ischemia. No ECG changes. EKG negative for ischemia.  Marland Kitchen CARDIOVERSION  May 2012  [name]  . CORONARY ANGIOPLASTY     2002-2003  . SHOULDER SURGERY    . TRANSTHORACIC ECHOCARDIOGRAM  05/29/2011   EF >70%, severe concentric LV hypertrophy, severe mitral annular calcification, mild-moderate aortic regurg,    Family History  Problem Relation Age of Onset  . Colon cancer Paternal Grandfather   . Heart attack Brother 6    Ventircular rupture  . Emphysema Brother   . Lung cancer Brother   . Heart disease Mother     Enlarged heart  . Stroke Mother   . Hyperlipidemia Father   . CAD Father   . Hypertension Sister   . CAD Paternal Uncle   . Stroke Paternal Uncle   .  Cancer Maternal Grandfather     colon  . Asthma Brother    Social History   Occupational History  . retired Event organiser Retired   Social History Main Topics  . Smoking status: Former Smoker    Packs/day: 1.00    Years: 10.00    Types: Cigarettes    Quit date: 02/26/1985  . Smokeless tobacco: Never Used  . Alcohol use No  . Drug use: No  . Sexual activity: No    Do you feel safe at home?  Yes  Dietary issues and exercise activities discussed: Current Exercise Habits: The patient does not participate in regular exercise at present (has chickens, blueberries and other gardening), Exercise limited by: None identified  Current Dietary habits:  patient eats a variety of foods - but he does admit to eating too large serving sizes.    Cardiac Risk Factors include: advanced age (>90men, >67 women);dyslipidemia;family history of premature cardiovascular disease;hypertension;male gender;obesity (BMI >30kg/m2);sedentary lifestyle   Functional Status Survey: Is the patient deaf or have difficulty hearing?: No (wears hearing aids - no problems) Does the patient have difficulty seeing, even when wearing glasses/contacts?: No Does the patient have difficulty concentrating, remembering, or making decisions?: No Does the patient have difficulty walking or climbing stairs?: No Does the patient have difficulty dressing or bathing?: No Does the patient have difficulty doing errands alone such as visiting a doctor's office or shopping?: No   Objective:    Today's Vitals   01/05/16 0939  BP: 138/62  Pulse: (!) 54  Weight: 196 lb 8 oz (89.1 kg)  Height: 5\' 9"  (1.753 m)  PainSc: 0-No pain   Body mass index is 29.02 kg/m.   INR = 2.5 today  Are there smokers in your home (other than you)? No    Depression Screen PHQ 2/9 Scores 01/05/2016 12/02/2015 10/13/2015 07/13/2015  PHQ - 2 Score 0 0 0 0    Fall Risk Fall Risk  01/05/2016 12/02/2015 10/13/2015 07/13/2015 05/04/2015  Falls in the  past year? No No No No No  Number falls in past yr: - - - - -    Cognitive Function: MMSE - Mini Mental State Exam 01/05/2016 11/24/2014  Orientation to time 5 5  Orientation to Place 5 5  Registration 3 3  Attention/ Calculation 3 2  Recall 3 3  Language- name 2 objects 2 2  Language- repeat 1 1  Language- follow 3 step command 3 3  Language- read & follow direction 1 1  Write a sentence 1 1  Copy design 1 1  Total score 28 27    Immunizations and Health Maintenance Immunization History  Administered Date(s) Administered  . Influenza Split 11/25/2012  . Influenza Whole 10/27/2009  . Influenza, High Dose Seasonal PF 12/02/2015  .  Influenza,inj,Quad PF,36+ Mos 12/14/2014  . Influenza-Unspecified 11/10/2013  . Pneumococcal Conjugate-13 01/29/2013  . Pneumococcal Polysaccharide-23 04/27/2011   There are no preventive care reminders to display for this patient.  Patient Care Team: Chipper Herb, MD as PCP - General (Family Medicine) Troy Sine, MD as Consulting Physician (Cardiology) Ladene Artist, MD as Consulting Physician (Gastroenterology) Steffanie Rainwater, DPM as Consulting Physician (Podiatry)  Indicate any recent Medical Services you may have received from other than [facility] providers in the past year (date may be approximate).    Assessment:    Annual Wellness Visit  Therapeutic anticogaulation   Screening Tests Health Maintenance  Topic Date Due  . COLON CANCER SCREENING ANNUAL FOBT  07/13/2016  . COLONOSCOPY  10/23/2016  . TETANUS/TDAP  09/26/2020  . INFLUENZA VACCINE  Completed  . ZOSTAVAX  Completed  . Hepatitis C Screening  Completed  . PNA vac Low Risk Adult  Completed        Plan:   During the course of the visit Othniel was educated and counseled about the following appropriate screening and preventive services:   Vaccines to include Pneumoccal, Influenza, Hepatitis B, Td, Zostavax - All vaccines are UTD  Colorectal cancer screening -  colonoscopy and FOBT are UTD  Cardiovascular disease screening - Lipid panel UTD and at goals; BP UTD and at goal.     Diabetes screening - UTD and WNL  Glaucoma screening / Eye Exam - UTD  Nutrition counseling - Discussed limiting caloric intake and smaller serving sizes.  Discussed using smaller plates and bowls.   Goal weight is 180# and discussed and explained BMI and goals.  Patient has lost about 4# compared to last year's AWV  Prostate cancer screening - UTD  Advanced directives - UTD  Discussed increasing physical activity.  Encouraged to get back into kayak club and other activities he enjoys.  Gave information on area [employer].  Anticoagulation Dose Instructions as of 01/05/2016      [name] Tue Wed Thu Fri Sat   New Dose 4 mg 6 mg 4 mg 4 mg 6 mg 4 mg 4 mg    Description   Continue dose as you have been taking the last month - take warfarin 4mg  tablet - 1 and 1/2 tablet on Mondays and Thursdays.  Take 1 tablet all other days.  INR was 2.5 today        Patient Instructions (the written plan) were given to the patient.   Cherre Robins, PharmD   01/05/2016

## 2016-01-10 ENCOUNTER — Ambulatory Visit (INDEPENDENT_AMBULATORY_CARE_PROVIDER_SITE_OTHER): Payer: Medicare Other | Admitting: Cardiovascular Disease

## 2016-01-10 ENCOUNTER — Encounter: Payer: Self-pay | Admitting: Cardiovascular Disease

## 2016-01-10 VITALS — BP 142/60 | HR 52 | Ht 69.0 in | Wt 195.8 lb

## 2016-01-10 DIAGNOSIS — I351 Nonrheumatic aortic (valve) insufficiency: Secondary | ICD-10-CM | POA: Diagnosis not present

## 2016-01-10 DIAGNOSIS — I480 Paroxysmal atrial fibrillation: Secondary | ICD-10-CM | POA: Diagnosis not present

## 2016-01-10 DIAGNOSIS — I2510 Atherosclerotic heart disease of native coronary artery without angina pectoris: Secondary | ICD-10-CM | POA: Diagnosis not present

## 2016-01-10 DIAGNOSIS — I421 Obstructive hypertrophic cardiomyopathy: Secondary | ICD-10-CM | POA: Diagnosis not present

## 2016-01-10 NOTE — Patient Instructions (Signed)
Medication Instructions:  Continue current medications  Labwork: None Ordered  Testing/Procedures: None Ordered  Follow-Up: Your physician wants you to follow-up in: 1 Year. You will receive a reminder letter in the mail two months in advance. If you don't receive a letter, please call our office to schedule the follow-up appointment.   Any Other Special Instructions Will Be Listed Below (If Applicable).          Happy Thanksgiving  If you need a refill on your cardiac medications before your next appointment, please call your pharmacy.

## 2016-01-13 ENCOUNTER — Encounter: Payer: Self-pay | Admitting: Pharmacist Clinician (PhC)/ Clinical Pharmacy Specialist

## 2016-01-17 DIAGNOSIS — I2510 Atherosclerotic heart disease of native coronary artery without angina pectoris: Secondary | ICD-10-CM | POA: Insufficient documentation

## 2016-01-17 NOTE — Progress Notes (Signed)
Patient ID: Joshua [name], male   DOB: 11-Dec-1945, 70 y.o.   MRN: [id number]      HPI: Joshua [name] is a 70 y.o. male presents to the office for a 6 month cardiology evaluation.  Joshua [name] has a history of hypertrophic obstructive cardiomyopathy, CAD and is status post stenting of his proximal RCA and has distal PDA occlusion with left-to-right collaterals as well as stenting of his proximal LAD. His last cardiac catheterization in 2007 revealed both stents patent. He  has a history of paroxysmal atrial fibrillation for which she's been on chronic Coumadin therapy and did have an episode of atrial flutter but has been maintaining sinus rhythm on Norvasc therapy.  His last nuclear perfusion study in April 2012 showed normal perfusion.   In the past, he has had issues with significant peripheral edema and shortness of breath, but this has stabilized.  An echo Doppler study in April 2013 confirmed severe concentric left ventricular hypertrophy. He had a resting left ventricle outflow track subvalvular gradient of 25 mm. Ejection fraction was hyperdynamic at 70%. He did have significant mitral annular calcification with mild mitral stenosis with mild MR as well as aortic valve sclerosis with mild to moderate aortic insufficiency.  A followup echo Doppler study on 05/18/2013 was eessentially unchanged from 2 years previously.  Ejection fraction is 60-65%.  His peak LVOT velocity was 2.6 m/s, there is severe asymmetric left hypertrophy with mild systolic anterior motion of the mitral valve and severe mitral annular calcification with mild MS. There is felt to be very mild aortic stenosis and aortic insufficiency. The left atrium was moderately dilated.  His PA pressure was 32 mm.  He has a history of hyperlipidemia and an NMR lipoprotein ldone by [name] 2 years ago showed continued improvement of his lipids with an LDL particle number of 763, calculated LDL 63, HDL 44, triglycerides 113, total cholesterol  130, and small LDL particle #232.  Insulin resistance score was 46.  [name] recently completed another NMR profile in Mayt 2017.  This continued to be excellent with an LDL particle #594, total cholesterol 126, LDL cholesterol 54, triglycerides 120, small LDL particles to 43.  Insulin resistance score was 44.  Earlier this year while kayaking in [place] he became overheated.  He denied associated palpitations or presyncope.  He denies any recurrent episodes of shortness of breath, dizziness or chest pain.  12/12/2015.  A follow-up echo Doppler study showed an EF of 55-60%.  There was severe LVH.  There were no wall motion abnormalities.  There was grade 2 diastolic dysfunction and Doppler parameters were consistent with high ventricular filling pressure.  There was mild aortic insufficiency.  There was severe mitral annular calcification with possible mild stenosis.  Left atrium was moderately dilated.  There was mild pulmonary hypertension with an estimated PA pressure 35 mm.  He had a resting LVOT gradient of 2.3 m/s with a mean gradient of 11 mmHg.  He underwent lab work by [name] last month.  He presents for evaluatio  Past Medical History:  Diagnosis Date  . Acute lower GI bleeding    related to gastritis / viral infection  . Atrial fibrillation ([place])    persistent  . Cataract   . Cirrhosis ([place])   . COPD (chronic obstructive pulmonary disease) ([place])    PFT 05-05-2010, FEV1 .9 (26%) ratio 60  . Coronary artery disease   . Gastritis 06-10-2006  . Hard of hearing   .  Hiatal hernia 06-10-2006   EGD  . HOH (hard of hearing)   . Hx of adenomatous colonic polyps 2005, 2013   Colonoscopy  . Hypercholesteremia   . Hypercholesteremia   . Hypertension   . Hypertr obst cardiomyop    mild subaortic stenosis  . Iron deficiency anemia, unspecified   . LVH (left ventricular hypertrophy)   . Other B-complex deficiencies   . Rheumatic fever   . Thrombocytopenia ([place]) 05/2008    Past Surgical  History:  Procedure Laterality Date  . CARDIAC CATHETERIZATION  05/28/2005   Widely patent coronaries and normal LV function.  Marland Kitchen CARDIAC CATHETERIZATION  08/11/2001   80% LAD stenosis successfully stented with a 3.5x25m Cypher DES postdilated to 3.998mresulting in reduction of 80% to 0%.  . CARDIAC CATHETERIZATION  01/15/2001   Focal 95% proximal RCA stenosis, stented with a [id number] multilink stent with postdilatation utilizing a 3.5x88m70mantum balloon with stenosis being reduced to 0%.  . CARDIOVASCULAR STRESS TEST  05/29/2011   No scintigraphic evidence of inducible myocardial ischemia. No ECG changes. EKG negative for ischemia.  . CAMarland KitchenDIOVERSION  May 2012   Dr. AllrRayann HemanCORONARY ANGIOPLASTY     2002-2003  . SHOULDER SURGERY    . TRANSTHORACIC ECHOCARDIOGRAM  05/29/2011   EF >70%, severe concentric LV hypertrophy, severe mitral annular calcification, mild-moderate aortic regurg,     Allergies  Allergen Reactions  . Tetracycline Other (See Comments)    Issue with stomach lining - can take with food if needed    Current Outpatient Prescriptions  Medication Sig Dispense Refill  . acetaminophen (TYLENOL) 325 MG tablet Take 650 mg by mouth as needed.     . cholecalciferol (VITAMIN D) 1000 units tablet Take 1,000 Units by mouth daily.    . cyanocobalamin ([facility] VITAMIN B12) 1000 MCG tablet Take 1 tablet (1,000 mcg total) by mouth daily.    . disopyramide (NORPACE) 150 MG capsule TAKE 2 CAPSULES BY MOUTH TWICE DAILY 360 capsule 0  . hydrochlorothiazide (HYDRODIURIL) 25 MG tablet TAKE 1 TABLET BY MOUTH DAILY 90 tablet 1  . JANTOVEN 4 MG tablet TAKE 1 TABLET BY MOUTH DAILY, EXCEPT ON TUES ANDFRIDAY TAKE 1 AND 1/2TAB 115 tablet 1  . omeprazole (PRILOSEC) 10 MG capsule TAKE 1 CAPSULE BY MOUTH DAILY 90 capsule 1  . polyethylene glycol (MIRALAX / GLYCOLAX) packet Take 17 g by mouth every other day.    . rosuvastatin (CRESTOR) 10 MG tablet TAKE 1 TABLET BY MOUTH DAILY 90 tablet 0   No current  facility-administered medications for this visit.     Social History   Social History  . Marital status: Widowed    Spouse name: N/A  . Number of children: 5  . Years of education: N/A   Occupational History  . retired law Event organiserired   Social History Main Topics  . Smoking status: Former Smoker    Packs/day: 1.00    Years: 10.00    Types: Cigarettes    Quit date: 02/26/1985  . Smokeless tobacco: Never Used  . Alcohol use No  . Drug use: No  . Sexual activity: No   Other Topics Concern  . Not on file   Social History Narrative   [place] and three daughters    Family History  Problem Relation Age of Onset  . Colon cancer Paternal Grandfather   . Heart attack Brother 30  4Ventircular rupture  .  Emphysema Brother   . Lung cancer Brother   . Heart disease Mother     Enlarged heart  . Stroke Mother   . Hyperlipidemia Father   . CAD Father   . Hypertension Sister   . CAD Paternal Uncle   . Stroke Paternal Uncle   . Cancer Maternal Grandfather     colon  . Asthma Brother    Socially he tries to remain active. He does kayak. He also does work in his yard. He is widowed. He has 4 children one deceased. One grandchild. Is no tobacco or alcohol use.  ROS General: Negative; No fevers, chills, or night sweats;  HEENT: Negative; No changes in vision or hearing, sinus congestion, difficulty swallowing Pulmonary: Negative; No cough, wheezing, shortness of breath, hemoptysis Cardiovascular: Negative; No chest pain, presyncope, syncope, palpitations GI: Negative; No nausea, vomiting, diarrhea, or abdominal pain GU: Negative; No dysuria, hematuria, or difficulty voiding Musculoskeletal: Negative; no myalgias, joint pain, or weakness Hematologic/Oncology: Positive for remote history of iron deficiency anemia no easy bruising, bleeding Endocrine: Negative; no heat/cold intolerance; no diabetes Neuro: Negative; no changes in  balance, headaches Skin: Brawny lower extremity skin changes from prior chronic edema;  No rashes or skin lesions Psychiatric: Negative; No behavioral problems, depression Sleep: Negative; No snoring, daytime sleepiness, hypersomnolence, bruxism, restless legs, hypnogognic hallucinations, no cataplexy Other comprehensive 14 point system review is negative.   PE BP (!) 142/60   Pulse (!) 52   Ht 5' 9"  (1.753 m)   Wt 195 lb 12.8 oz (88.8 kg)   BMI 28.91 kg/m   Repeat blood pressure by me 136/64  Wt Readings from Last 3 Encounters:  01/10/16 195 lb 12.8 oz (88.8 kg)  01/05/16 196 lb 8 oz (89.1 kg)  12/02/15 193 lb (87.5 kg)   General: Alert, oriented, no distress.  Skin: normal turgor, no rashes HEENT: Normocephalic, atraumatic. Pupils round and reactive; sclera anicteric;no lid lag.  Nose without nasal septal hypertrophy Mouth/Parynx benign; Mallinpatti scale 3 Neck: No JVD, transmitted murmur to carotids Lungs: clear to ausculatation and percussion; no wheezing or rales Heart: RRR, s1 s2 normal 2/6 systolic murmur in the aortic region and a 1/6 diastolic murmur compatible with his aortic insufficiency. Mild bisferens pulse with murmurs transmitted to his carotids. Murmur increase with the squatting position. Abdomen: Moderate central adiposity; soft, nontender; no hepatosplenomehaly, BS+; abdominal aorta nontender and not dilated by palpation. Pulses 2+ Extremities: Brawny stasis changes which are old without edema presently; no clubbing cyanosis, Homan's sign negative  Neurologic: grossly nonfocal Psychologic: normal affect and mood.  ECG (independently read by me): Sinus bradycardia 52 bpm.  First-degree heart block with a PR interval at 270 ms.  QTc interval 496 ms.  May 2017 ECG (independently read by me): Sinus bradycardia 59 bpm.  Nonspecific intraventricular conduction delay.  T-wave changes in 3 and aVF.  April 2016 ECG (independently read by me): Sinus bradycardia at 56  bpm.  First-degree AV block.  Left bundle branch block.  October 2015 ECG (independently read by me): Sinus rhythm at 60 beats per minute with first degree AV block with a PR interval of 236 ms.  LDH with QRS widening.  QTC 514 ms.  Prior 06/15/2013 ECG (independently read by me and (sinus rhythm at 59 beats per minute.  First degree AV block with PR interval 220 ms.  Increased QTC of 500 ms.  Interventricular conduction delay.  Prior 10/ 22/2014 ECG: Sinus rhythm with first-degree AV block with PR interval  232 ms. QT interval 495 ms.  LABS:  BMP Latest Ref Rng & Units 12/02/2015 07/13/2015 02/23/2015  Glucose 65 - 99 mg/dL 74 64(L) 80  BUN 8 - 27 mg/dL 22 19 19   Creatinine 0.76 - 1.27 mg/dL 1.18 1.10 1.11  BUN/Creat Ratio 10 - 24 19 17 17   Sodium 134 - 144 mmol/L 142 138 138  Potassium 3.5 - 5.2 mmol/L 3.8 3.9 3.6  Chloride 96 - 106 mmol/L 96 94(L) 99  CO2 18 - 29 mmol/L 32(H) 32(H) 28  Calcium 8.6 - 10.2 mg/dL 8.7 8.9 8.7    Hepatic Function Latest Ref Rng & Units 12/02/2015 07/13/2015 02/23/2015  Total Protein 6.0 - 8.5 g/dL 7.1 7.0 6.9  Albumin 3.5 - 4.8 g/dL 4.0 4.0 3.8  AST 0 - 40 IU/L 36 26 25  ALT 0 - 44 IU/L 21 14 19   Alk Phosphatase 39 - 117 IU/L 60 62 66  Total Bilirubin 0.0 - 1.2 mg/dL 0.5 0.6 0.5  Bilirubin, Direct 0.00 - 0.40 mg/dL 0.17 0.18 0.18     CBC Latest Ref Rng & Units 12/02/2015 07/13/2015 02/23/2015  WBC 3.4 - 10.8 x10E3/uL 6.6 6.3 6.2  Hemoglobin 13.0 - 17.0 g/dL - - -  Hematocrit 37.5 - 51.0 % 48.3 48.6 44.6  Platelets 150 - 379 x10E3/uL 131(L) 143(L) 141(L)   Lab Results  Component Value Date   MCV 96 12/02/2015   MCV 96 07/13/2015   MCV 94 02/23/2015    No results found for: HGBA1C   Lab Results  Component Value Date   TSH 2.620 01/29/2014    Lab Results  Component Value Date   TSH 2.620 01/29/2014       Component Value Date/Time   PROBNP 541.0 (H) 05/05/2010 1158    Lipid Panel     Component Value Date/Time   CHOL 133 12/02/2015  0920   CHOL 126 08/20/2012 0930   TRIG 107 12/02/2015 0920   TRIG 94 08/20/2012 0930   HDL 47 12/02/2015 0920   HDL 41 08/20/2012 0930   LDLCALC 63 09/29/2013 0907   LDLCALC 66 08/20/2012 0930     RADIOLOGY: No results found.     ASSESSMENT AND PLAN: Joshua [name] is a 70 year old male who has a history of hypertrophic obstructive cardiomyopathy and his gradients have remained stable on subsequent echocardiographic evaluations.  He  is status post two-vessel coronary intervention and at last catheterization a patent stents in his LAD and proximal right coronary artery. He is maintaining sinus rhythm on Norpace 150 mg twice a day and has not had any breakthrough recurrent atrial fibrillation or atrial flutter.  His blood pressure today is controlled.  His last nuclear perfusion study in April 2013 continued to show fairly normal perfusion.  Presently, he denies any anginal symptoms.  It is my recommendation that he continue to take Norpace which is beneficial for his hypertrophic obstructive cardiomyopathy.  I do not believe he is a  candidate for amiodarone due to prior lung issues.  He continues to be on warfarin for anticoagulation.  He continues to be on Crestor 10 mg for hyperlipidemia.  I reviewed his most recent echo Doppler study from 12/12/2015 with him in detail.  Since he does not have any edema and he does have an LVOT obstruction I have suggested he reduce his hydrochlorothiazide from 25 mg to 12.5 mg.  He is sleeping well.  His recent lab work revealed mild early reduced platelets at 131.  There is no  bleeding.  As long as he remains stable, I will see him in one year for reevaluation. Time spent: 25 minutes  Troy Sine, MD, [facility]  01/17/2016 7:29 AM

## 2016-01-23 ENCOUNTER — Other Ambulatory Visit: Payer: Self-pay | Admitting: Family Medicine

## 2016-02-03 ENCOUNTER — Other Ambulatory Visit: Payer: Self-pay | Admitting: Family Medicine

## 2016-02-17 ENCOUNTER — Ambulatory Visit (INDEPENDENT_AMBULATORY_CARE_PROVIDER_SITE_OTHER): Payer: Medicare Other | Admitting: Pharmacist Clinician (PhC)/ Clinical Pharmacy Specialist

## 2016-02-17 DIAGNOSIS — I4891 Unspecified atrial fibrillation: Secondary | ICD-10-CM

## 2016-02-17 DIAGNOSIS — I482 Chronic atrial fibrillation, unspecified: Secondary | ICD-10-CM

## 2016-02-17 LAB — COAGUCHEK XS/INR WAIVED
INR: 2.3 — ABNORMAL HIGH (ref 0.9–1.1)
Prothrombin Time: 27.2 s

## 2016-02-17 NOTE — Patient Instructions (Signed)
Anticoagulation Dose Instructions as of 02/17/2016      Joshua [name] Tue Wed Thu Fri Sat   New Dose 4 mg 6 mg 4 mg 4 mg 6 mg 4 mg 4 mg    Description   Continue taking warfarin the same way as listed above  INR was 2.3 today

## 2016-04-06 ENCOUNTER — Ambulatory Visit (INDEPENDENT_AMBULATORY_CARE_PROVIDER_SITE_OTHER): Payer: Medicare Other | Admitting: Pharmacist

## 2016-04-06 DIAGNOSIS — I482 Chronic atrial fibrillation, unspecified: Secondary | ICD-10-CM

## 2016-04-06 DIAGNOSIS — I4891 Unspecified atrial fibrillation: Secondary | ICD-10-CM

## 2016-04-06 LAB — COAGUCHEK XS/INR WAIVED
INR: 2.2 — ABNORMAL HIGH (ref 0.9–1.1)
Prothrombin Time: 26.7 s

## 2016-04-06 NOTE — Patient Instructions (Signed)
Anticoagulation Dose Instructions as of 04/06/2016      Joshua [name] Tue Wed Thu Fri Sat   New Dose 4 mg 6 mg 4 mg 4 mg 6 mg 4 mg 4 mg    Description   Continue taking warfarin the same way as listed above  INR was 2.2 today (goal: 2-3)

## 2016-04-19 ENCOUNTER — Encounter: Payer: Self-pay | Admitting: Family Medicine

## 2016-04-19 ENCOUNTER — Ambulatory Visit (INDEPENDENT_AMBULATORY_CARE_PROVIDER_SITE_OTHER): Payer: Medicare Other | Admitting: Family Medicine

## 2016-04-19 ENCOUNTER — Other Ambulatory Visit: Payer: Self-pay | Admitting: *Deleted

## 2016-04-19 VITALS — BP 129/54 | HR 61 | Temp 97.0°F | Ht 69.0 in | Wt 196.8 lb

## 2016-04-19 DIAGNOSIS — N3001 Acute cystitis with hematuria: Secondary | ICD-10-CM | POA: Diagnosis not present

## 2016-04-19 DIAGNOSIS — I4891 Unspecified atrial fibrillation: Secondary | ICD-10-CM

## 2016-04-19 DIAGNOSIS — R310 Gross hematuria: Secondary | ICD-10-CM

## 2016-04-19 LAB — URINALYSIS, COMPLETE
Bilirubin, UA: NEGATIVE
Glucose, UA: NEGATIVE
Ketones, UA: NEGATIVE
Nitrite, UA: NEGATIVE
Specific Gravity, UA: 1.015 (ref 1.005–1.030)
Urobilinogen, Ur: 0.2 mg/dL (ref 0.2–1.0)
pH, UA: 6 (ref 5.0–7.5)

## 2016-04-19 LAB — MICROSCOPIC EXAMINATION
Bacteria, UA: NONE SEEN
RBC, UA: >30 /hpf — AB (ref 0–?)
Renal Epithel, UA: NONE SEEN /hpf
WBC, UA: >30 /hpf — AB (ref 0–?)

## 2016-04-19 LAB — COAGUCHEK XS/INR WAIVED
INR: 2.2 — ABNORMAL HIGH (ref 0.9–1.1)
Prothrombin Time: 26.2 s

## 2016-04-19 MED ORDER — CEPHALEXIN 500 MG PO CAPS: 500.0000 mg | ORAL_CAPSULE | Freq: Four times a day (QID) | ORAL | 0 refills | Status: DC

## 2016-04-19 NOTE — Progress Notes (Signed)
BP (!) 129/54   Pulse 61   Temp 97 F (36.1 C) (Oral)   Ht 5\' 9"  (1.753 m)   Wt 196 lb 12.8 oz (89.3 kg)   BMI 29.06 kg/m    Subjective:    Patient ID: Joshua [name], male    DOB: 04/07/1945, 71 y.o.   MRN: [id number]  HPI: Joshua [name] is a 71 y.o. male presenting on 04/19/2016 for Hematuria   HPI Dysuria or blood in urine Patient is been having dysuria and hematuria. He had one or 2 episodes of dysuria last night and then had one large episode of hematuria this morning and has had some urgency and frequency today. He denies any fevers or chills or flank pain or abdominal pain. He denies any trauma. He is on Coumadin so that was the concern about the hematuria. He says that the hematuria has cleared up as the day has progressed. Left a urine sample earlier this morning  Relevant past medical, surgical, family and social history reviewed and updated as indicated. Interim medical history since our last visit reviewed. Allergies and medications reviewed and updated.  Review of Systems  Constitutional: Negative for chills and fever.  Respiratory: Negative for shortness of breath and wheezing.   Cardiovascular: Negative for chest pain and leg swelling.  Gastrointestinal: Positive for abdominal pain. Negative for constipation, diarrhea, nausea and vomiting.  Genitourinary: Positive for dysuria, frequency, hematuria and urgency. Negative for difficulty urinating and flank pain.  Musculoskeletal: Negative for back pain and gait problem.  Skin: Negative for rash.  All other systems reviewed and are negative.   Per HPI unless specifically indicated above     Objective:    BP (!) 129/54   Pulse 61   Temp 97 F (36.1 C) (Oral)   Ht 5\' 9"  (1.753 m)   Wt 196 lb 12.8 oz (89.3 kg)   BMI 29.06 kg/m   Wt Readings from Last 3 Encounters:  04/19/16 196 lb 12.8 oz (89.3 kg)  01/10/16 195 lb 12.8 oz (88.8 kg)  01/05/16 196 lb 8 oz (89.1 kg)    Physical Exam  Constitutional: He is  oriented to person, place, and time. He appears well-developed and well-nourished. No distress.  Eyes: Conjunctivae are normal. Right eye exhibits no discharge. Left eye exhibits no discharge. No scleral icterus.  Cardiovascular: Normal rate, regular rhythm, normal heart sounds and intact distal pulses.   No murmur heard. Pulmonary/Chest: Effort normal and breath sounds normal. No respiratory distress. He has no wheezes. He has no rales.  Abdominal: Soft. Bowel sounds are normal. He exhibits no distension. There is no tenderness. There is no rigidity, no rebound, no guarding and no CVA tenderness.  Musculoskeletal: Normal range of motion. He exhibits no edema.  Neurological: He is alert and oriented to person, place, and time. Coordination normal.  Skin: Skin is warm and dry. No rash noted. He is not diaphoretic.  Psychiatric: He has a normal mood and affect. His behavior is normal.  Nursing note and vitals reviewed.   Results for orders placed or performed in visit on 04/19/16  Microscopic Examination  Result Value Ref Range   WBC, UA >30 (A) 0 - 5 /hpf   RBC, UA >30 (A) 0 - 2 /hpf   Epithelial Cells (non renal) 0-10 0 - 10 /hpf   Renal Epithel, UA None seen None seen /hpf   Mucus, UA Present Not Estab.   Bacteria, UA None seen None seen/Few  Urinalysis, Complete  Result Value Ref Range   Specific Gravity, UA 1.015 1.005 - 1.030   pH, UA 6.0 5.0 - 7.5   Color, UA Yellow Yellow   Appearance Ur Clear Clear   Leukocytes, UA 2+ (A) Negative   Protein, UA 3+ (A) Negative/Trace   Glucose, UA Negative Negative   Ketones, UA Negative Negative   RBC, UA 3+ (A) Negative   Bilirubin, UA Negative Negative   Urobilinogen, Ur 0.2 0.2 - 1.0 mg/dL   Nitrite, UA Negative Negative   Microscopic Examination See below:       Assessment & Plan:   Problem List Items Addressed This Visit    None    Visit Diagnoses    Acute cystitis with hematuria    -  Primary   Relevant Medications    cephALEXin (KEFLEX) 500 MG capsule   Gross hematuria           Follow up plan: Return if symptoms worsen or fail to improve.  Counseling provided for all of the vaccine components Orders Placed This Encounter  Procedures  . Microscopic Examination    Caryl Pina, MD [facility] 04/19/2016, 2:08 PM

## 2016-04-19 NOTE — Addendum Note (Signed)
Addended by: Cherre Robins on: 04/19/2016 03:06 PM   Modules accepted: Orders

## 2016-04-23 ENCOUNTER — Ambulatory Visit: Payer: Medicare Other | Admitting: Family Medicine

## 2016-05-03 ENCOUNTER — Encounter: Payer: Self-pay | Admitting: Family Medicine

## 2016-05-03 ENCOUNTER — Ambulatory Visit (INDEPENDENT_AMBULATORY_CARE_PROVIDER_SITE_OTHER): Payer: Medicare Other | Admitting: Family Medicine

## 2016-05-03 VITALS — BP 134/62 | HR 56 | Temp 97.0°F | Ht 69.0 in | Wt 197.0 lb

## 2016-05-03 DIAGNOSIS — N400 Enlarged prostate without lower urinary tract symptoms: Secondary | ICD-10-CM

## 2016-05-03 DIAGNOSIS — D696 Thrombocytopenia, unspecified: Secondary | ICD-10-CM

## 2016-05-03 DIAGNOSIS — Z0000 Encounter for general adult medical examination without abnormal findings: Secondary | ICD-10-CM | POA: Diagnosis not present

## 2016-05-03 DIAGNOSIS — K7460 Unspecified cirrhosis of liver: Secondary | ICD-10-CM

## 2016-05-03 DIAGNOSIS — I4891 Unspecified atrial fibrillation: Secondary | ICD-10-CM

## 2016-05-03 DIAGNOSIS — E559 Vitamin D deficiency, unspecified: Secondary | ICD-10-CM

## 2016-05-03 DIAGNOSIS — I10 Essential (primary) hypertension: Secondary | ICD-10-CM

## 2016-05-03 DIAGNOSIS — I421 Obstructive hypertrophic cardiomyopathy: Secondary | ICD-10-CM

## 2016-05-03 DIAGNOSIS — D692 Other nonthrombocytopenic purpura: Secondary | ICD-10-CM

## 2016-05-03 DIAGNOSIS — E785 Hyperlipidemia, unspecified: Secondary | ICD-10-CM

## 2016-05-03 LAB — COAGUCHEK XS/INR WAIVED
INR: 2.4 — ABNORMAL HIGH (ref 0.9–1.1)
Prothrombin Time: 28.3 s

## 2016-05-03 MED ORDER — DISOPYRAMIDE PHOSPHATE 150 MG PO CAPS: 300.0000 mg | ORAL_CAPSULE | Freq: Two times a day (BID) | ORAL | 3 refills | Status: DC

## 2016-05-03 MED ORDER — WARFARIN SODIUM 4 MG PO TABS: ORAL_TABLET | ORAL | 3 refills | Status: DC

## 2016-05-03 MED ORDER — ROSUVASTATIN CALCIUM 10 MG PO TABS: 10.0000 mg | ORAL_TABLET | Freq: Every day | ORAL | 3 refills | Status: DC

## 2016-05-03 MED ORDER — OMEPRAZOLE 10 MG PO CPDR
10.0000 mg | DELAYED_RELEASE_CAPSULE | Freq: Every day | ORAL | 3 refills | Status: DC
Start: 2016-05-03 — End: 2017-05-07

## 2016-05-03 MED ORDER — HYDROCHLOROTHIAZIDE 25 MG PO TABS: 25.0000 mg | ORAL_TABLET | Freq: Every day | ORAL | 3 refills | Status: DC

## 2016-05-03 NOTE — Addendum Note (Signed)
Addended by: Zannie Cove on: 05/03/2016 09:22 AM   Modules accepted: Orders

## 2016-05-03 NOTE — Progress Notes (Signed)
Subjective:    Patient ID: Joshua [name], male    DOB: 14-Apr-1945, 71 y.o.   MRN: [id number]  HPI Patient is here today for annual wellness exam and follow up of chronic medical problems which includes a fib, hyperlipidemia and hypertension. He is taking medication regularly.This patient is followed regularly by the cardiologist, Dr. Claiborne Billings. He has obstructive cardiomyopathy. He also has thrombocytopenia and hyperlipidemia with  atrial fibrillation. At that last visit his ACT was reduced from 25-12.5 mg. That note was reviewed during the visit today. He complains of neck pain and stiffness today. He had C-spine films and December 2016 which showed multilevel degenerative disc disease and cervical spondylosis . The patient denies any chest pain. He does have some shortness of breath or gets winded easily when he exerts himself but this is no worse than usual. He has no trouble with nausea vomiting diarrhea blood in the stool or black tarry bowel movements and there is no change in his bowel habits. He is due a colonoscopy in the late summer. He is aware of this. He had a recent urinary tract infection and the symptoms with this have resolved and he is better we will check another urine today.    Patient Active Problem List   Diagnosis Date Noted  . CAD in native artery 01/17/2016  . HOCM (hypertrophic obstructive cardiomyopathy) ([place]) 07/16/2015  . Hepatic cirrhosis ([place])   . Syncope and collapse 01/14/2015  . Lower GI bleeding 01/14/2015  . Elevated INR 01/14/2015  . Arterial hypotension   . Erectile dysfunction 11/10/2014  . Thrombocytopenia ([place]) 01/30/2014  . Hyperlipidemia LDL goal <70 12/03/2013  . High risk medication use 05/21/2013  . BPH (benign prostatic hyperplasia) 01/29/2013  . Family history of colon cancer 01/29/2013  . PAF (paroxysmal atrial fibrillation) ([place]) 12/17/2012  . Aortic valve insufficiency 12/17/2012  . Mild mitral stenosis 12/17/2012  . Pulmonary infiltrates  07/17/2010  . COPD UNSPECIFIED 05/05/2010  . Hypertrophic obstructive cardiomyopathy ([place]) 03/31/2010  . ATRIAL FLUTTER 03/31/2010  . B12 deficiency 10/11/2008  . ANEMIA, IRON DEFICIENCY 10/11/2008  . Coronary atherosclerosis 10/11/2008  . FIBRILLATION, ATRIAL 10/11/2008  . HIATAL HERNIA WITH REFLUX 10/11/2008  . COLONIC POLYPS, ADENOMATOUS, HX OF 10/11/2008   Outpatient Encounter Prescriptions as of 05/03/2016  Medication Sig  . acetaminophen (TYLENOL) 325 MG tablet Take 650 mg by mouth as needed.   . cholecalciferol (VITAMIN D) 1000 units tablet Take 1,000 Units by mouth daily.  . cyanocobalamin ([facility]S VITAMIN B12) 1000 MCG tablet Take 1 tablet (1,000 mcg total) by mouth daily.  . disopyramide (NORPACE) 150 MG capsule TAKE 2 CAPSULES BY MOUTH TWICE DAILY  . hydrochlorothiazide (HYDRODIURIL) 25 MG tablet TAKE 1 TABLET BY MOUTH DAILY  . JANTOVEN 4 MG tablet TAKE 1 TABLET BY MOUTH DAILY, EXCEPT ON TUES ANDFRIDAY TAKE 1 AND 1/2TAB  . omeprazole (PRILOSEC) 10 MG capsule TAKE 1 CAPSULE BY MOUTH DAILY  . polyethylene glycol (MIRALAX / GLYCOLAX) packet Take 17 g by mouth every other day.  . rosuvastatin (CRESTOR) 10 MG tablet TAKE 1 TABLET BY MOUTH DAILY  . [DISCONTINUED] cephALEXin (KEFLEX) 500 MG capsule Take 1 capsule (500 mg total) by mouth 4 (four) times daily.   No facility-administered encounter medications on file as of 05/03/2016.      Review of Systems  Constitutional: Negative.   HENT: Negative.   Eyes: Negative.   Respiratory: Negative.   Cardiovascular: Negative.   Gastrointestinal: Negative.   Endocrine: Negative.   Genitourinary: Negative.  Musculoskeletal: Positive for arthralgias (hands hurt) and neck stiffness ("crick in the neck").  Skin: Negative.   Allergic/Immunologic: Negative.   Neurological: Negative.   Hematological: Negative.   Psychiatric/Behavioral: Negative.        Objective:   Physical Exam  Constitutional: He is oriented to person, place, and time.  He appears well-developed and well-nourished. No distress.  HENT:  Head: Normocephalic and atraumatic.  Right Ear: External ear normal.  Left Ear: External ear normal.  Nose: Nose normal.  Mouth/Throat: Oropharynx is clear and moist. No oropharyngeal exudate.  Bilateral hearing aids  Eyes: Conjunctivae and EOM are normal. Pupils are equal, round, and reactive to light. Right eye exhibits no discharge. Left eye exhibits no discharge. No scleral icterus.  Neck: Normal range of motion. Neck supple. No thyromegaly present.  No bruits thyromegaly or anterior cervical adenopathy  Cardiovascular: Normal rate, regular rhythm, normal heart sounds and intact distal pulses.   No murmur heard. The heart is regular at 60/m with a grade 2/6 systolic ejection murmur  Pulmonary/Chest: Effort normal and breath sounds normal. No respiratory distress. He has no wheezes. He has no rales. He exhibits no tenderness.  Clear anteriorly and posteriorly with basilar atelectasis on the left posteriorly no axillary adenopathy. Lungs were clear otherwise.  Abdominal: Soft. Bowel sounds are normal. He exhibits no mass. There is no tenderness. There is no rebound and no guarding.  No liver or spleen enlargement. No bruits. No inguinal adenopathy.  Genitourinary: Rectum normal and penis normal.  Genitourinary Comments: The prostate is slightly enlarged and irregular bilaterally. There were no hernias palpable. The external genitalia were within normal limits.  Musculoskeletal: Normal range of motion. He exhibits no edema.  Lymphadenopathy:    He has no cervical adenopathy.  Neurological: He is alert and oriented to person, place, and time. He has normal reflexes. No cranial nerve deficit.  Skin: Skin is warm and dry. No rash noted.  Several areas of purpura.  Psychiatric: He has a normal mood and affect. His behavior is normal. Judgment and thought content normal.  Nursing note and vitals reviewed.  BP 134/62 (BP  Location: Left Arm)   Pulse (!) 56   Temp 97 F (36.1 C) (Oral)   Ht 5' 9" (1.753 m)   Wt 197 lb (89.4 kg)   BMI 29.09 kg/m         Assessment & Plan:  1. Annual physical exam -The patient will need his 5 year colonoscopy late summer - BMP8+EGFR - CBC with Differential/Platelet - NMR, lipoprofile - Hepatic function panel - VITAMIN D 25 Hydroxy (Vit-D Deficiency, Fractures) - PSA, total and free - Urinalysis, Complete  2. Atrial fibrillation, unspecified type ([place]) -Heart was regular today at 60/m -He will continue to follow-up with his cardiologist, Dr. Claiborne Billings. - CBC with Differential/Platelet  3. Vitamin D deficiency -Continue vitamin D replacement pending results of lab work - CBC with Differential/Platelet - VITAMIN D 25 Hydroxy (Vit-D Deficiency, Fractures)  4. Hyperlipidemia, unspecified hyperlipidemia type -Continue current treatment and aggressive therapeutic lifestyle changes pending results of lab work - CBC with Differential/Platelet - NMR, lipoprofile  5. Essential hypertension -The blood pressure is good today. He is currently taking his hydrochlorothiazide every other day and he says that this works for him in controlling the fluid. - BMP8+EGFR - CBC with Differential/Platelet - Hepatic function panel  6. Thrombocytopenia ([place]) -We will continue to monitor this. - CBC with Differential/Platelet  7. Senile purpura ([place]) -The patient says he bruises  easily and this is apparent by some of the bruising he has today. He also takes Coumadin and aspirin.  8. Hypertrophic obstructive cardiomyopathy ([place]) -Intended to follow-up with cardiology.  9. Hepatic cirrhosis, unspecified hepatic cirrhosis type ([place]) -We will continue to monitor liver function tests.  10. Benign prostatic hyperplasia without lower urinary tract symptoms -The prostate is enlarged slightly and slightly irregular we will check his PSA today.  Meds ordered this encounter    Medications  . disopyramide (NORPACE) 150 MG capsule    Sig: Take 2 capsules (300 mg total) by mouth 2 (two) times daily.    Dispense:  360 capsule    Refill:  3  . hydrochlorothiazide (HYDRODIURIL) 25 MG tablet    Sig: Take 1 tablet (25 mg total) by mouth daily.    Dispense:  90 tablet    Refill:  3  . omeprazole (PRILOSEC) 10 MG capsule    Sig: Take 1 capsule (10 mg total) by mouth daily.    Dispense:  90 capsule    Refill:  3  . rosuvastatin (CRESTOR) 10 MG tablet    Sig: Take 1 tablet (10 mg total) by mouth daily.    Dispense:  90 tablet    Refill:  3  . warfarin (JANTOVEN) 4 MG tablet    Sig: TAKE 1 TABLET BY MOUTH DAILY, EXCEPT ON TUES ANDFRIDAY TAKE 1 AND 1/2TAB    Dispense:  115 tablet    Refill:  3   Patient Instructions                       Medicare Annual Wellness Visit  [facility] and the medical providers at [facility] strive to bring you the best medical care.  In doing so we not only want to address your current medical conditions and concerns but also to detect new conditions early and prevent illness, disease and health-related problems.    Medicare offers a yearly Wellness Visit which allows our clinical staff to assess your need for preventative services including immunizations, lifestyle education, counseling to decrease risk of preventable diseases and screening for fall risk and other medical concerns.    This visit is provided free of charge (no copay) for all Medicare recipients. The clinical pharmacists at [facility] have begun to conduct these Wellness Visits which will also include a thorough review of all your medications.    As you primary medical provider recommend that you make an appointment for your Annual Wellness Visit if you have not done so already this year.  You may set up this appointment before you leave today or you may call back ([phone]) and schedule an appointment.  Please make sure when  you call that you mention that you are scheduling your Annual Wellness Visit with the clinical pharmacist so that the appointment may be made for the proper length of time.     Continue current medications. Continue good therapeutic lifestyle changes which include good diet and exercise. Fall precautions discussed with patient. If an FOBT was given today- please return it to our front desk. If you are over 58 years old - you may need Prevnar 1 or the adult Pneumonia vaccine.  **Flu shots are available--- please call and schedule a FLU-CLINIC appointment**  After your visit with [place] today you will receive a survey in the mail or online from [employer] regarding your care with [place]. Please take a moment to fill this out.  Your feedback is very important to [place] as you can help [place] better understand your patient needs as well as improve your experience and satisfaction. WE CARE ABOUT YOU!!!   We will call with lab work results as soon as these results become available Continue to drink plenty of fluids and stay well hydrated Continue with your hydrochlorothiazide as you're currently doing as long as it is controlling the fluid and edema. Remember that you are due to get your colonoscopy in the late summer. Continue to follow-up with cardiology    Arrie Senate MD

## 2016-05-03 NOTE — Patient Instructions (Addendum)
Medicare Annual Wellness Visit  [facility] and the medical providers at [facility] strive to bring you the best medical care.  In doing so we not only want to address your current medical conditions and concerns but also to detect new conditions early and prevent illness, disease and health-related problems.    Medicare offers a yearly Wellness Visit which allows our clinical staff to assess your need for preventative services including immunizations, lifestyle education, counseling to decrease risk of preventable diseases and screening for fall risk and other medical concerns.    This visit is provided free of charge (no copay) for all Medicare recipients. The clinical pharmacists at [facility] have begun to conduct these Wellness Visits which will also include a thorough review of all your medications.    As you primary medical provider recommend that you make an appointment for your Annual Wellness Visit if you have not done so already this year.  You may set up this appointment before you leave today or you may call back ([phone]) and schedule an appointment.  Please make sure when you call that you mention that you are scheduling your Annual Wellness Visit with the clinical pharmacist so that the appointment may be made for the proper length of time.     Continue current medications. Continue good therapeutic lifestyle changes which include good diet and exercise. Fall precautions discussed with patient. If an FOBT was given today- please return it to our front desk. If you are over 68 years old - you may need Prevnar 53 or the adult Pneumonia vaccine.  **Flu shots are available--- please call and schedule a FLU-CLINIC appointment**  After your visit with [place] today you will receive a survey in the mail or online from [employer] regarding your care with [place]. Please take a moment to fill this out. Your feedback is very  important to [place] as you can help [place] better understand your patient needs as well as improve your experience and satisfaction. WE CARE ABOUT YOU!!!   We will call with lab work results as soon as these results become available Continue to drink plenty of fluids and stay well hydrated Continue with your hydrochlorothiazide as you're currently doing as long as it is controlling the fluid and edema. Remember that you are due to get your colonoscopy in the late summer. Continue to follow-up with cardiology

## 2016-05-04 LAB — BMP8+EGFR
BUN/Creatinine Ratio: 17 (ref 10–24)
BUN: 19 mg/dL (ref 8–27)
CO2: 29 mmol/L (ref 18–29)
Calcium: 8.9 mg/dL (ref 8.6–10.2)
Chloride: 98 mmol/L (ref 96–106)
Creatinine, Ser: 1.09 mg/dL (ref 0.76–1.27)
GFR calc Af Amer: 79 mL/min/{1.73_m2} (ref >59)
GFR calc non Af Amer: 68 mL/min/{1.73_m2} (ref >59)
Glucose: 75 mg/dL (ref 65–99)
Potassium: 4.3 mmol/L (ref 3.5–5.2)
Sodium: 144 mmol/L (ref 134–144)

## 2016-05-04 LAB — PSA, TOTAL AND FREE
PSA, Free Pct: 37.5 %
PSA, Free: 0.15 ng/mL
Prostate Specific Ag, Serum: 0.4 ng/mL (ref 0.0–4.0)

## 2016-05-04 LAB — CBC WITH DIFFERENTIAL/PLATELET
Basophils Absolute: 0 10*3/uL (ref 0.0–0.2)
Basos: 0 %
EOS (ABSOLUTE): 0.2 10*3/uL (ref 0.0–0.4)
Eos: 3 %
Hematocrit: 48.6 % (ref 37.5–51.0)
Hemoglobin: 16 g/dL (ref 13.0–17.7)
Immature Grans (Abs): 0 10*3/uL (ref 0.0–0.1)
Immature Granulocytes: 0 %
Lymphocytes Absolute: 1.5 10*3/uL (ref 0.7–3.1)
Lymphs: 25 %
MCH: 31.2 pg (ref 26.6–33.0)
MCHC: 32.9 g/dL (ref 31.5–35.7)
MCV: 95 fL (ref 79–97)
Monocytes Absolute: 0.5 10*3/uL (ref 0.1–0.9)
Monocytes: 8 %
Neutrophils Absolute: 3.9 10*3/uL (ref 1.4–7.0)
Neutrophils: 64 %
Platelets: 142 10*3/uL — ABNORMAL LOW (ref 150–379)
RBC: 5.13 x10E6/uL (ref 4.14–5.80)
RDW: 14.1 % (ref 12.3–15.4)
WBC: 6 10*3/uL (ref 3.4–10.8)

## 2016-05-04 LAB — NMR, LIPOPROFILE
Cholesterol: 141 mg/dL (ref 100–199)
HDL Cholesterol by NMR: 48 mg/dL (ref >39)
HDL Particle Number: 33.9 umol/L (ref >=30.5)
LDL Particle Number: 832 nmol/L (ref <1000)
LDL Size: 20.6 nm (ref >20.5)
LDL-C: 74 mg/dL (ref 0–99)
LP-IR Score: 47 — ABNORMAL HIGH (ref <=45)
Small LDL Particle Number: 335 nmol/L (ref <=527)
Triglycerides by NMR: 95 mg/dL (ref 0–149)

## 2016-05-04 LAB — HEPATIC FUNCTION PANEL
ALT: 25 IU/L (ref 0–44)
AST: 29 IU/L (ref 0–40)
Albumin: 3.9 g/dL (ref 3.5–4.8)
Alkaline Phosphatase: 59 IU/L (ref 39–117)
Bilirubin Total: 0.6 mg/dL (ref 0.0–1.2)
Bilirubin, Direct: 0.2 mg/dL (ref 0.00–0.40)
Total Protein: 7.2 g/dL (ref 6.0–8.5)

## 2016-05-04 LAB — VITAMIN D 25 HYDROXY (VIT D DEFICIENCY, FRACTURES): Vit D, 25-Hydroxy: 48.7 ng/mL (ref 30.0–100.0)

## 2016-05-08 ENCOUNTER — Telehealth: Payer: Self-pay

## 2016-05-08 NOTE — Telephone Encounter (Signed)
Please refer this information to his cardiologist, Dr. Claiborne Billings. This is important

## 2016-05-08 NOTE — Telephone Encounter (Signed)
Faxed to Dr Claiborne Billings

## 2016-05-11 ENCOUNTER — Telehealth: Payer: Self-pay | Admitting: Family Medicine

## 2016-05-15 NOTE — Telephone Encounter (Signed)
DWM was notified that this was denied and he had me send a message to Cardiology about it so I dont know what he wants to do

## 2016-05-18 ENCOUNTER — Telehealth: Payer: Self-pay | Admitting: Cardiovascular Disease

## 2016-05-18 NOTE — Telephone Encounter (Signed)
Pt needs prior authorization for his Disopyramide(generic Norpace).Please call this to [facility],pt needs this asap please.

## 2016-06-14 ENCOUNTER — Ambulatory Visit (INDEPENDENT_AMBULATORY_CARE_PROVIDER_SITE_OTHER): Payer: Medicare Other | Admitting: Pharmacist

## 2016-06-14 ENCOUNTER — Telehealth: Payer: Self-pay | Admitting: *Deleted

## 2016-06-14 DIAGNOSIS — I480 Paroxysmal atrial fibrillation: Secondary | ICD-10-CM

## 2016-06-14 DIAGNOSIS — Z7901 Long term (current) use of anticoagulants: Secondary | ICD-10-CM | POA: Diagnosis not present

## 2016-06-14 LAB — COAGUCHEK XS/INR WAIVED
INR: 2.2 — ABNORMAL HIGH (ref 0.9–1.1)
Prothrombin Time: 26.2 s

## 2016-06-14 NOTE — Telephone Encounter (Signed)
Sent PA for patient's Disopyramide 150 mg capsules to cover my meds.

## 2016-06-22 DIAGNOSIS — Z0289 Encounter for other administrative examinations: Secondary | ICD-10-CM

## 2016-07-27 ENCOUNTER — Ambulatory Visit (INDEPENDENT_AMBULATORY_CARE_PROVIDER_SITE_OTHER): Payer: Medicare Other | Admitting: Pharmacist Clinician (PhC)/ Clinical Pharmacy Specialist

## 2016-07-27 DIAGNOSIS — Z7901 Long term (current) use of anticoagulants: Secondary | ICD-10-CM | POA: Diagnosis not present

## 2016-07-27 DIAGNOSIS — I4891 Unspecified atrial fibrillation: Secondary | ICD-10-CM

## 2016-07-27 DIAGNOSIS — I480 Paroxysmal atrial fibrillation: Secondary | ICD-10-CM

## 2016-07-27 LAB — COAGUCHEK XS/INR WAIVED
INR: 2.4 — ABNORMAL HIGH (ref 0.9–1.1)
Prothrombin Time: 28.6 s

## 2016-07-27 NOTE — Patient Instructions (Signed)
Anticoagulation Warfarin Dose Instructions as of 07/27/2016      Joshua [name] Tue Wed Thu Fri Sat   New Dose 4 mg 6 mg 4 mg 4 mg 6 mg 4 mg 4 mg    Description   Continue taking warfarin the same way as listed above  INR was 2.4 today (goal: 2.0 to 3.0)

## 2016-08-22 ENCOUNTER — Encounter: Payer: Self-pay | Admitting: Gastroenterology

## 2016-09-03 ENCOUNTER — Encounter: Payer: Self-pay | Admitting: *Deleted

## 2016-09-07 ENCOUNTER — Encounter: Payer: Self-pay | Admitting: Pharmacist Clinician (PhC)/ Clinical Pharmacy Specialist

## 2016-09-14 ENCOUNTER — Ambulatory Visit (INDEPENDENT_AMBULATORY_CARE_PROVIDER_SITE_OTHER): Payer: Medicare Other | Admitting: Pharmacist

## 2016-09-14 DIAGNOSIS — Z7901 Long term (current) use of anticoagulants: Secondary | ICD-10-CM

## 2016-09-14 DIAGNOSIS — I480 Paroxysmal atrial fibrillation: Secondary | ICD-10-CM

## 2016-09-14 DIAGNOSIS — I482 Chronic atrial fibrillation, unspecified: Secondary | ICD-10-CM

## 2016-09-14 NOTE — Patient Instructions (Signed)
Anticoagulation Warfarin Dose Instructions as of 09/14/2016      Joshua [name] Tue Wed Thu Fri Sat   New Dose 4 mg 6 mg 4 mg 4 mg 6 mg 4 mg 4 mg    Description   Continue current warfarin dose - take 6mg  or 1 and 1/2 tablets on Mondays and Thursdays.  Take 4mg  or 1 tablet all other days.  INR was 2.4 today (goal: 2.0 to 3.0)

## 2016-09-17 LAB — COAGUCHEK XS/INR WAIVED
INR: 2.4 — ABNORMAL HIGH (ref 0.9–1.1)
Prothrombin Time: 28.2 s

## 2016-10-15 ENCOUNTER — Encounter: Payer: Self-pay | Admitting: Gastroenterology

## 2016-10-15 ENCOUNTER — Ambulatory Visit (INDEPENDENT_AMBULATORY_CARE_PROVIDER_SITE_OTHER): Payer: Medicare Other | Admitting: Gastroenterology

## 2016-10-15 ENCOUNTER — Telehealth: Payer: Self-pay

## 2016-10-15 VITALS — BP 152/50 | HR 56 | Ht 67.32 in | Wt 192.1 lb

## 2016-10-15 DIAGNOSIS — Z7901 Long term (current) use of anticoagulants: Secondary | ICD-10-CM | POA: Diagnosis not present

## 2016-10-15 DIAGNOSIS — Z86010 Personal history of colonic polyps: Secondary | ICD-10-CM

## 2016-10-15 DIAGNOSIS — K5904 Chronic idiopathic constipation: Secondary | ICD-10-CM

## 2016-10-15 MED ORDER — NA SULFATE-K SULFATE-MG SULF 17.5-3.13-1.6 GM/177ML PO SOLN: 1.0000 | Freq: Once | ORAL | 0 refills | Status: AC

## 2016-10-15 NOTE — Progress Notes (Signed)
History of Present Illness: This is a 71 year old male here today for the evaluation of history of adenomatous colon polyps. Constipation is controlled with Miralax. His last colonoscopy was performed in August 2013 by Dr. Sharlett Iles with 3 small polyps removed. Pathology showed 2 tubular adenomas and one hyperplastic polyp. He is maintained on Coumadin for history of atrial fibrillation. Denies weight loss, abdominal pain, diarrhea, change in stool caliber, melena, hematochezia, nausea, vomiting, dysphagia, reflux symptoms, chest pain.    Allergies  Allergen Reactions  . Tetracycline Other (See Comments)    Issue with stomach lining - can take with food if needed   Outpatient Medications Prior to Visit  Medication Sig Dispense Refill  . acetaminophen (TYLENOL) 325 MG tablet Take 650 mg by mouth as needed.     . cholecalciferol (VITAMIN D) 1000 units tablet Take 1,000 Units by mouth daily.    . cyanocobalamin ([facility]S VITAMIN B12) 1000 MCG tablet Take 1 tablet (1,000 mcg total) by mouth daily.    . disopyramide (NORPACE) 150 MG capsule Take 2 capsules (300 mg total) by mouth 2 (two) times daily. 360 capsule 3  . hydrochlorothiazide (HYDRODIURIL) 25 MG tablet Take 1 tablet (25 mg total) by mouth daily. 90 tablet 3  . omeprazole (PRILOSEC) 10 MG capsule Take 1 capsule (10 mg total) by mouth daily. 90 capsule 3  . polyethylene glycol (MIRALAX / GLYCOLAX) packet Take 17 g by mouth every other day.    . rosuvastatin (CRESTOR) 10 MG tablet Take 1 tablet (10 mg total) by mouth daily. 90 tablet 3  . warfarin (JANTOVEN) 4 MG tablet TAKE 1 TABLET BY MOUTH DAILY, EXCEPT ON TUES ANDFRIDAY TAKE 1 AND 1/2TAB 115 tablet 3   No facility-administered medications prior to visit.    Past Medical History:  Diagnosis Date  . Acute lower GI bleeding    related to gastritis / viral infection  . Atrial fibrillation ([place])    persistent  . Cataract   . Cirrhosis ([place])   . COPD (chronic obstructive pulmonary  disease) ([place])    PFT 05-05-2010, FEV1 .9 (26%) ratio 60  . Coronary artery disease   . Gastritis 06-10-2006  . Hard of hearing   . Hiatal hernia 06-10-2006   EGD  . HOH (hard of hearing)   . Hx of adenomatous colonic polyps 2005, 2013   Colonoscopy  . Hypercholesteremia   . Hypercholesteremia   . Hypertension   . Hypertr obst cardiomyop    mild subaortic stenosis  . Iron deficiency anemia, unspecified   . LVH (left ventricular hypertrophy)   . Other B-complex deficiencies   . Rheumatic fever   . Thrombocytopenia ([place]) 05/2008   Past Surgical History:  Procedure Laterality Date  . CARDIAC CATHETERIZATION  05/28/2005   Widely patent coronaries and normal LV function.  Marland Kitchen CARDIAC CATHETERIZATION  08/11/2001   80% LAD stenosis successfully stented with a 3.5x34mm Cypher DES postdilated to 3.26mm resulting in reduction of 80% to 0%.  . CARDIAC CATHETERIZATION  01/15/2001   Focal 95% proximal RCA stenosis, stented with a 3x54mm Zeta multilink stent with postdilatation utilizing a 3.5x61mm Quantum balloon with stenosis being reduced to 0%.  . CARDIOVASCULAR STRESS TEST  05/29/2011   No scintigraphic evidence of inducible myocardial ischemia. No ECG changes. EKG negative for ischemia.  Marland Kitchen CARDIOVERSION  May 2012   [name]  . CORONARY ANGIOPLASTY     2002-2003  . SHOULDER SURGERY    . TRANSTHORACIC ECHOCARDIOGRAM  05/29/2011  EF >70%, severe concentric LV hypertrophy, severe mitral annular calcification, mild-moderate aortic regurg,    Social History   Social History  . Marital status: Widowed    Spouse name: N/A  . Number of children: 5  . Years of education: N/A   Occupational History  . retired Event organiser Retired   Social History Main Topics  . Smoking status: Former Smoker    Packs/day: 1.00    Years: 10.00    Types: Cigarettes    Quit date: 02/26/1985  . Smokeless tobacco: Never Used  . Alcohol use No  . Drug use: No  . Sexual activity: No   Other Topics Concern  .  None   Social History Narrative   Lives in [place]   Married with two sons and three daughters   Family History  Problem Relation Age of Onset  . Colon cancer Paternal Grandfather   . Heart attack Brother 51       Ventircular rupture  . Emphysema Brother   . Lung cancer Brother   . Heart disease Mother        Enlarged heart  . Stroke Mother   . Hyperlipidemia Father   . CAD Father   . Hypertension Sister   . CAD Paternal Uncle   . Stroke Paternal Uncle   . Cancer Maternal Grandfather        colon  . Asthma Brother       Review of Systems: Pertinent positive and negative review of systems were noted in the above HPI section. All other review of systems were otherwise negative.  Current Medications, Allergies, Past Medical History, Past Surgical History, Family History and Social History were reviewed in [employer] record.  Physical Exam: General: Well developed, well nourished, no acute distress Head: Normocephalic and atraumatic Eyes:  sclerae anicteric, EOMI Ears: Normal auditory acuity Mouth: No deformity or lesions Neck: Supple, no masses or thyromegaly Lungs: Clear throughout to auscultation Heart: Regular rate and rhythm; 2/6 systolic murmur; no rubs or bruits Abdomen: Soft, non tender and non distended. No masses, hepatosplenomegaly or hernias noted. Normal Bowel sounds Rectal: Deferred to colonoscopy Musculoskeletal: Symmetrical with no gross deformities  Skin: No lesions on visible extremities Pulses:  Normal pulses noted Extremities: No clubbing, cyanosis, edema or deformities noted Neurological: Alert oriented x 4, grossly nonfocal Cervical Nodes:  No significant cervical adenopathy Inguinal Nodes: No significant inguinal adenopathy Psychological:  Alert and cooperative. Normal mood and affect  Assessment and Recommendations:  1. Personal history of adenomatous colon polyps. He is due for a five-year surveillance  colonoscopy. Schedule colonoscopy. The risks (including bleeding, perforation, infection, missed lesions, medication reactions and possible hospitalization or surgery if complications occur), benefits, and alternatives to colonoscopy with possible biopsy and possible polypectomy were discussed with the patient and they consent to proceed.   2.  Afib on Coumadin. Hold Coumadiin 5 days before procedure - will instruct when and how to resume after procedure. Low but real risk of cardiovascular event such as heart attack, stroke, embolism, thrombosis or ischemia/infarct of other organs off Coumadin explained and need to seek urgent help if this occurs. The patient consents to proceed. Will communicate by phone or EMR with patient's prescribing provider to confirm that holding Coumadin is reasonable in this case.   3. Constipation. Continue MiraLAX daily.

## 2016-10-15 NOTE — Patient Instructions (Signed)
You have been scheduled for a colonoscopy. Please follow written instructions given to you at your visit today.  Please pick up your prep supplies at the pharmacy within the next 1-3 days. If you use inhalers (even only as needed), please bring them with you on the day of your procedure. Your physician has requested that you go to www.startemmi.com and enter the access code given to you at your visit today. This web site gives a general overview about your procedure. However, you should still follow specific instructions given to you by our office regarding your preparation for the procedure.  Normal BMI (Body Mass Index- based on height and weight) is between 23 and 30. Your BMI today is Body mass index is 29.81 kg/m. Marland Kitchen Please consider follow up  regarding your BMI with your Primary Care Provider.  Thank you for choosing me and [facility].  Pricilla Riffle. Dagoberto Ligas., MD., [facility] s

## 2016-10-15 NOTE — Telephone Encounter (Signed)
   LAQUINN SHIPPY 08/22/1945 [id number]  Dear [name]:  We have scheduled the above named patient for a(n) Colonoscopy procedure. Our records show that (s)he is on anticoagulation therapy.  Please advise as to whether the patient may come off their therapy of coumadin 5 days prior to their procedure which is scheduled for 12/04/16.  Please route your response to Marlon Pel, CMA or fax response to [phone].  Sincerely,    [facility]

## 2016-10-16 NOTE — Telephone Encounter (Signed)
Please discuss with his cardiologist to make sure that he does not need to be bridged with leaving off Coumadin for 5 days and restarting it the day after and get his recommendation.

## 2016-10-18 NOTE — Telephone Encounter (Signed)
When I last saw the patient he was maintaining sinus rhythm.  Okay to hold Coumadin for 4-5 days prior to procedure without bridging.

## 2016-10-19 NOTE — Telephone Encounter (Signed)
Patient informed to hold coumadin 5 days prior to his procedure. Patient verbalized understanding.

## 2016-10-26 ENCOUNTER — Ambulatory Visit (INDEPENDENT_AMBULATORY_CARE_PROVIDER_SITE_OTHER): Payer: Medicare Other | Admitting: Pharmacist Clinician (PhC)/ Clinical Pharmacy Specialist

## 2016-10-26 DIAGNOSIS — I4891 Unspecified atrial fibrillation: Secondary | ICD-10-CM | POA: Diagnosis not present

## 2016-10-26 DIAGNOSIS — Z7901 Long term (current) use of anticoagulants: Secondary | ICD-10-CM | POA: Diagnosis not present

## 2016-10-26 DIAGNOSIS — I480 Paroxysmal atrial fibrillation: Secondary | ICD-10-CM

## 2016-10-26 LAB — COAGUCHEK XS/INR WAIVED
INR: 2.2 — ABNORMAL HIGH (ref 0.9–1.1)
Prothrombin Time: 26.7 s

## 2016-10-26 NOTE — Patient Instructions (Signed)
Anticoagulation Warfarin Dose Instructions as of 10/26/2016      Joshua [name] Tue Wed Thu Fri Sat   New Dose 4 mg 6 mg 4 mg 4 mg 6 mg 4 mg 4 mg    Description   Continue current warfarin dose - take 6mg  or 1 and 1/2 tablets on Mondays and Thursdays.  Take 4mg  or 1 tablet all other days.  INR was 2.2 today (goal: 2.0 to 3.0)

## 2016-10-31 ENCOUNTER — Other Ambulatory Visit: Payer: Medicare Other

## 2016-10-31 DIAGNOSIS — I480 Paroxysmal atrial fibrillation: Secondary | ICD-10-CM

## 2016-10-31 DIAGNOSIS — E559 Vitamin D deficiency, unspecified: Secondary | ICD-10-CM

## 2016-10-31 DIAGNOSIS — E785 Hyperlipidemia, unspecified: Secondary | ICD-10-CM

## 2016-10-31 DIAGNOSIS — D696 Thrombocytopenia, unspecified: Secondary | ICD-10-CM

## 2016-10-31 DIAGNOSIS — I10 Essential (primary) hypertension: Secondary | ICD-10-CM

## 2016-11-01 LAB — CBC WITH DIFFERENTIAL/PLATELET
Basophils Absolute: 0 10*3/uL (ref 0.0–0.2)
Basos: 0 %
EOS (ABSOLUTE): 0.1 10*3/uL (ref 0.0–0.4)
Eos: 3 %
Hematocrit: 46.9 % (ref 37.5–51.0)
Hemoglobin: 16.1 g/dL (ref 13.0–17.7)
Immature Grans (Abs): 0 10*3/uL (ref 0.0–0.1)
Immature Granulocytes: 0 %
Lymphocytes Absolute: 1.3 10*3/uL (ref 0.7–3.1)
Lymphs: 27 %
MCH: 31.7 pg (ref 26.6–33.0)
MCHC: 34.3 g/dL (ref 31.5–35.7)
MCV: 92 fL (ref 79–97)
Monocytes Absolute: 0.3 10*3/uL (ref 0.1–0.9)
Monocytes: 7 %
Neutrophils Absolute: 3.2 10*3/uL (ref 1.4–7.0)
Neutrophils: 63 %
Platelets: 113 10*3/uL — ABNORMAL LOW (ref 150–379)
RBC: 5.08 x10E6/uL (ref 4.14–5.80)
RDW: 14.4 % (ref 12.3–15.4)
WBC: 5 10*3/uL (ref 3.4–10.8)

## 2016-11-01 LAB — NMR, LIPOPROFILE
Cholesterol: 128 mg/dL (ref 100–199)
HDL Cholesterol by NMR: 46 mg/dL (ref >39)
HDL Particle Number: 30.9 umol/L (ref >=30.5)
LDL Particle Number: 804 nmol/L (ref <1000)
LDL Size: 20.8 nm (ref >20.5)
LDL-C: 59 mg/dL (ref 0–99)
LP-IR Score: 45 (ref <=45)
Small LDL Particle Number: 250 nmol/L (ref <=527)
Triglycerides by NMR: 113 mg/dL (ref 0–149)

## 2016-11-01 LAB — BMP8+EGFR
BUN/Creatinine Ratio: 16 (ref 10–24)
BUN: 18 mg/dL (ref 8–27)
CO2: 31 mmol/L — ABNORMAL HIGH (ref 20–29)
Calcium: 8.8 mg/dL (ref 8.6–10.2)
Chloride: 97 mmol/L (ref 96–106)
Creatinine, Ser: 1.15 mg/dL (ref 0.76–1.27)
GFR calc Af Amer: 73 mL/min/{1.73_m2} (ref >59)
GFR calc non Af Amer: 63 mL/min/{1.73_m2} (ref >59)
Glucose: 88 mg/dL (ref 65–99)
Potassium: 4.2 mmol/L (ref 3.5–5.2)
Sodium: 141 mmol/L (ref 134–144)

## 2016-11-01 LAB — HEPATIC FUNCTION PANEL
ALT: 16 IU/L (ref 0–44)
AST: 28 IU/L (ref 0–40)
Albumin: 3.8 g/dL (ref 3.5–4.8)
Alkaline Phosphatase: 56 IU/L (ref 39–117)
Bilirubin Total: 0.5 mg/dL (ref 0.0–1.2)
Bilirubin, Direct: 0.17 mg/dL (ref 0.00–0.40)
Total Protein: 6.8 g/dL (ref 6.0–8.5)

## 2016-11-01 LAB — VITAMIN D 25 HYDROXY (VIT D DEFICIENCY, FRACTURES): Vit D, 25-Hydroxy: 43 ng/mL (ref 30.0–100.0)

## 2016-11-02 ENCOUNTER — Ambulatory Visit: Payer: Medicare Other | Admitting: Family Medicine

## 2016-11-06 ENCOUNTER — Encounter: Payer: Self-pay | Admitting: Family Medicine

## 2016-11-06 ENCOUNTER — Ambulatory Visit (INDEPENDENT_AMBULATORY_CARE_PROVIDER_SITE_OTHER): Payer: Medicare Other | Admitting: Family Medicine

## 2016-11-06 VITALS — BP 139/61 | HR 69 | Temp 97.1°F | Ht 67.32 in | Wt 190.0 lb

## 2016-11-06 DIAGNOSIS — I421 Obstructive hypertrophic cardiomyopathy: Secondary | ICD-10-CM | POA: Diagnosis not present

## 2016-11-06 DIAGNOSIS — E559 Vitamin D deficiency, unspecified: Secondary | ICD-10-CM

## 2016-11-06 DIAGNOSIS — I10 Essential (primary) hypertension: Secondary | ICD-10-CM

## 2016-11-06 DIAGNOSIS — K7460 Unspecified cirrhosis of liver: Secondary | ICD-10-CM | POA: Diagnosis not present

## 2016-11-06 DIAGNOSIS — D696 Thrombocytopenia, unspecified: Secondary | ICD-10-CM | POA: Diagnosis not present

## 2016-11-06 DIAGNOSIS — D692 Other nonthrombocytopenic purpura: Secondary | ICD-10-CM | POA: Diagnosis not present

## 2016-11-06 DIAGNOSIS — I480 Paroxysmal atrial fibrillation: Secondary | ICD-10-CM | POA: Diagnosis not present

## 2016-11-06 DIAGNOSIS — E785 Hyperlipidemia, unspecified: Secondary | ICD-10-CM

## 2016-11-06 NOTE — Progress Notes (Signed)
Subjective:    Patient ID: Joshua [name], male    DOB: 1945-12-08, 71 y.o.   MRN: [id number]  HPI Pt here for follow up and management of chronic medical problems which includes a fib, hyperlipidemia and hypertension. He is taking medication regularly.The patient is doing well overall. He does complain of some nosebleeds and arthritic symptoms. He is due to return an FOBT and he has had lab work done which will will review with him during the visit today. All cholesterol numbers were excellent and at goal with an LDL particle number being 804 and LDL C being 59. Triglycerides are good at 113 and the good cholesterol remains within normal limits. The blood sugar was good at 88 and the creatinine was good and within normal limits. All electrolytes including potassium are good. The CBC had a normal white blood cell count. The hemoglobin was good at 16.1 and the platelet count remains slightly decreased 113,000. This is been the case in the past and we will continue to monitor this. All liver function tests were good. The vitamin D level is 43.0 and we'll make sure that he continues his current treatment. The patient is due for his repeat colonoscopy because of adenomatous polyps. He is seeing Dr. Fuller Plan for this. He is also followed by Dr. Claiborne Billings for his hypertropic obstructive cardiomyopathy and paroxysmal atrial fibrillation. The patient will have his colonoscopy in about 3 weeks. He does not need to do an FOBT today. He denies any chest pain or shortness of breath anymore than usual. He has noticed that swallowing cornbread is more difficult recently but no other trouble swallowing anything else. He denies heartburn indigestion nausea vomiting diarrhea or blood in the stool. He is passing his water without problems. He tells me he plans to get married again sometime soon. Were happy for him. He does have these nosebleeds from the left side and he does have an overhead fan that he uses at home. He says that  nosebleeds seem to be worse when he takes his extra dose of Coumadin.     Patient Active Problem List   Diagnosis Date Noted  . Long term (current) use of anticoagulants [Z79.01] 06/14/2016  . CAD in native artery 01/17/2016  . HOCM (hypertrophic obstructive cardiomyopathy) ([place]) 07/16/2015  . Hepatic cirrhosis ([place])   . Syncope and collapse 01/14/2015  . Lower GI bleeding 01/14/2015  . Elevated INR 01/14/2015  . Arterial hypotension   . Erectile dysfunction 11/10/2014  . Thrombocytopenia ([place]) 01/30/2014  . Hyperlipidemia LDL goal <70 12/03/2013  . High risk medication use 05/21/2013  . BPH (benign prostatic hyperplasia) 01/29/2013  . Family history of colon cancer 01/29/2013  . PAF (paroxysmal atrial fibrillation) ([place]) 12/17/2012  . Aortic valve insufficiency 12/17/2012  . Mild mitral stenosis 12/17/2012  . Pulmonary infiltrates 07/17/2010  . COPD UNSPECIFIED 05/05/2010  . Hypertrophic obstructive cardiomyopathy ([place]) 03/31/2010  . ATRIAL FLUTTER 03/31/2010  . B12 deficiency 10/11/2008  . ANEMIA, IRON DEFICIENCY 10/11/2008  . Coronary atherosclerosis 10/11/2008  . FIBRILLATION, ATRIAL 10/11/2008  . HIATAL HERNIA WITH REFLUX 10/11/2008  . COLONIC POLYPS, ADENOMATOUS, HX OF 10/11/2008   Outpatient Encounter Prescriptions as of 11/06/2016  Medication Sig  . acetaminophen (TYLENOL) 325 MG tablet Take 650 mg by mouth as needed.   . cholecalciferol (VITAMIN D) 1000 units tablet Take 1,000 Units by mouth daily.  . cyanocobalamin ([employer] VITAMIN B12) 1000 MCG tablet Take 1 tablet (1,000 mcg total) by mouth daily.  . disopyramide (NORPACE)  150 MG capsule Take 2 capsules (300 mg total) by mouth 2 (two) times daily.  . hydrochlorothiazide (HYDRODIURIL) 25 MG tablet Take 1 tablet (25 mg total) by mouth daily.  Marland Kitchen omeprazole (PRILOSEC) 10 MG capsule Take 1 capsule (10 mg total) by mouth daily.  . polyethylene glycol (MIRALAX / GLYCOLAX) packet Take 17 g by mouth every other day.  .  rosuvastatin (CRESTOR) 10 MG tablet Take 1 tablet (10 mg total) by mouth daily.  Marland Kitchen warfarin (JANTOVEN) 4 MG tablet TAKE 1 TABLET BY MOUTH DAILY, EXCEPT ON TUES ANDFRIDAY TAKE 1 AND 1/2TAB   No facility-administered encounter medications on file as of 11/06/2016.       Review of Systems  Constitutional: Negative.   HENT: Positive for nosebleeds.   Eyes: Negative.   Respiratory: Negative.   Cardiovascular: Negative.   Gastrointestinal: Negative.   Endocrine: Negative.   Genitourinary: Negative.   Musculoskeletal: Positive for arthralgias.  Skin: Negative.   Allergic/Immunologic: Negative.   Neurological: Negative.   Hematological: Negative.   Psychiatric/Behavioral: Negative.        Objective:   Physical Exam  Constitutional: He is oriented to person, place, and time. He appears well-developed and well-nourished. No distress.  Pleasant and alert and good with managing his health care  HENT:  Head: Normocephalic and atraumatic.  Right Ear: External ear normal.  Left Ear: External ear normal.  Mouth/Throat: Oropharynx is clear and moist. No oropharyngeal exudate.  Small area of left nasal septum that has an irritation in the lower part of the septum that could be where the bleeding is coming from. No active bleeding currently.  Eyes: Pupils are equal, round, and reactive to light. Conjunctivae and EOM are normal. Right eye exhibits no discharge. Left eye exhibits no discharge. No scleral icterus.  Neck: Normal range of motion. Neck supple. No thyromegaly present.  No thyromegaly or adenopathy bilaterally.  Cardiovascular: Normal rate, regular rhythm and intact distal pulses.   Murmur heard. The heart is regular at 72/m  Pulmonary/Chest: Effort normal and breath sounds normal. No respiratory distress. He has no wheezes. He has no rales. He exhibits no tenderness.  No axillary adenopathy and good breath sounds anteriorly and posteriorly  Abdominal: Soft. Bowel sounds are normal.  He exhibits no mass. There is no tenderness. There is no rebound and no guarding.  No liver or spleen enlargement no epigastric tenderness no inguinal adenopathy and no masses.  Musculoskeletal: Normal range of motion. He exhibits no edema.  Lymphadenopathy:    He has no cervical adenopathy.  Neurological: He is alert and oriented to person, place, and time. He has normal reflexes. No cranial nerve deficit.  Skin: Skin is warm and dry. No rash noted.  Psychiatric: He has a normal mood and affect. His behavior is normal. Judgment and thought content normal.  Nursing note and vitals reviewed.   BP 139/61 (BP Location: Left Arm)   Pulse 69   Temp (!) 97.1 F (36.2 C) (Oral)   Ht 5' 7.32" (1.71 m)   Wt 190 lb (86.2 kg)   BMI 29.48 kg/m        Assessment & Plan:  1. PAF (paroxysmal atrial fibrillation) (HCC) -The heart was in normal sinus rhythm today at 00/Q with systolic ejection murmur. Patient will follow-up with cardiology as planned  2. Vitamin D deficiency -Continue with vitamin D replacement pending results of future lab work. He will increase his current vitamin D 3 and have him take 2000 units on Saturday  and Sunday and 1000 Monday through Friday.  3. Essential hypertension -Blood pressure is good today and he will continue with current treatment  4. Hyperlipidemia, unspecified hyperlipidemia type -Continue with statin drug and therapeutic lifestyle changes  5. Thrombocytopenia ([place]) -Platelet count remains slightly decreased but we will continue to monitor this.  6. Senile purpura ([place]) -He still has senile purpura.  7. Hypertrophic obstructive cardiomyopathy ([place]) -Follow-up with cardiology as planned. This appears to be stable.  8. Hepatic cirrhosis, unspecified hepatic cirrhosis type ([place]) -All liver function tests currently are within normal limits.  Patient Instructions                       Medicare Annual Wellness Visit  [facility] and the medical  providers at [facility] strive to bring you the best medical care.  In doing so we not only want to address your current medical conditions and concerns but also to detect new conditions early and prevent illness, disease and health-related problems.    Medicare offers a yearly Wellness Visit which allows our clinical staff to assess your need for preventative services including immunizations, lifestyle education, counseling to decrease risk of preventable diseases and screening for fall risk and other medical concerns.    This visit is provided free of charge (no copay) for all Medicare recipients. The clinical pharmacists at [facility] have begun to conduct these Wellness Visits which will also include a thorough review of all your medications.    As you primary medical provider recommend that you make an appointment for your Annual Wellness Visit if you have not done so already this year.  You may set up this appointment before you leave today or you may call back ([phone]) and schedule an appointment.  Please make sure when you call that you mention that you are scheduling your Annual Wellness Visit with the clinical pharmacist so that the appointment may be made for the proper length of time.     Continue current medications. Continue good therapeutic lifestyle changes which include good diet and exercise. Fall precautions discussed with patient. If an FOBT was given today- please return it to our front desk. If you are over 17 years old - you may need Prevnar 77 or the adult Pneumonia vaccine.  **Flu shots are available--- please call and schedule a FLU-CLINIC appointment**  After your visit with [place] today you will receive a survey in the mail or online from [employer] regarding your care with [place]. Please take a moment to fill this out. Your feedback is very important to [place] as you can help [place] better understand your patient needs as well as  improve your experience and satisfaction. WE CARE ABOUT YOU!!!   Follow-up with cardiology as planned Continue with diet and exercise regimen May need refill on alprostadil in the future for erectile dysfunction Stop the use of the overhead fan Continue with nasal saline gel nasal saline spray during the day Get colonoscopy as planned   Arrie Senate MD

## 2016-11-06 NOTE — Patient Instructions (Addendum)
Medicare Annual Wellness Visit  [facility] and the medical providers at [facility] strive to bring you the best medical care.  In doing so we not only want to address your current medical conditions and concerns but also to detect new conditions early and prevent illness, disease and health-related problems.    Medicare offers a yearly Wellness Visit which allows our clinical staff to assess your need for preventative services including immunizations, lifestyle education, counseling to decrease risk of preventable diseases and screening for fall risk and other medical concerns.    This visit is provided free of charge (no copay) for all Medicare recipients. The clinical pharmacists at [facility] have begun to conduct these Wellness Visits which will also include a thorough review of all your medications.    As you primary medical provider recommend that you make an appointment for your Annual Wellness Visit if you have not done so already this year.  You may set up this appointment before you leave today or you may call back ([phone]) and schedule an appointment.  Please make sure when you call that you mention that you are scheduling your Annual Wellness Visit with the clinical pharmacist so that the appointment may be made for the proper length of time.     Continue current medications. Continue good therapeutic lifestyle changes which include good diet and exercise. Fall precautions discussed with patient. If an FOBT was given today- please return it to our front desk. If you are over 92 years old - you may need Prevnar 7 or the adult Pneumonia vaccine.  **Flu shots are available--- please call and schedule a FLU-CLINIC appointment**  After your visit with [place] today you will receive a survey in the mail or online from [employer] regarding your care with [place]. Please take a moment to fill this out. Your feedback is very  important to [place] as you can help [place] better understand your patient needs as well as improve your experience and satisfaction. WE CARE ABOUT YOU!!!   Follow-up with cardiology as planned Continue with diet and exercise regimen May need refill on alprostadil in the future for erectile dysfunction Stop the use of the overhead fan Continue with nasal saline gel nasal saline spray during the day Get colonoscopy as planned

## 2016-11-07 ENCOUNTER — Ambulatory Visit: Payer: Medicare Other | Admitting: Family Medicine

## 2016-11-27 ENCOUNTER — Encounter: Payer: Self-pay | Admitting: Gastroenterology

## 2016-12-04 ENCOUNTER — Ambulatory Visit (AMBULATORY_SURGERY_CENTER): Payer: Medicare Other | Admitting: Gastroenterology

## 2016-12-04 ENCOUNTER — Encounter: Payer: Self-pay | Admitting: Gastroenterology

## 2016-12-04 VITALS — BP 120/53 | HR 59 | Temp 98.0°F | Resp 16 | Ht 67.0 in | Wt 192.0 lb

## 2016-12-04 DIAGNOSIS — Z86010 Personal history of colonic polyps: Secondary | ICD-10-CM

## 2016-12-04 DIAGNOSIS — D123 Benign neoplasm of transverse colon: Secondary | ICD-10-CM

## 2016-12-04 MED ORDER — SODIUM CHLORIDE 0.9 % IV SOLN: 500.0000 mL | INTRAVENOUS | Status: DC

## 2016-12-04 NOTE — Patient Instructions (Signed)
YOU HAD AN ENDOSCOPIC PROCEDURE TODAY AT [facility]:   Refer to the procedure report that was given to you for any specific questions about what was found during the examination.  If the procedure report does not answer your questions, please call your gastroenterologist to clarify.  If you requested that your care partner not be given the details of your procedure findings, then the procedure report has been included in a sealed envelope for you to review at your convenience later.  YOU SHOULD EXPECT: Some feelings of bloating in the abdomen. Passage of more gas than usual.  Walking can help get rid of the air that was put into your GI tract during the procedure and reduce the bloating. If you had a lower endoscopy (such as a colonoscopy or flexible sigmoidoscopy) you may notice spotting of blood in your stool or on the toilet paper. If you underwent a bowel prep for your procedure, you may not have a normal bowel movement for a few days.  Please Note:  You might notice some irritation and congestion in your nose or some drainage.  This is from the oxygen used during your procedure.  There is no need for concern and it should clear up in a day or so.  SYMPTOMS TO REPORT IMMEDIATELY:   Following lower endoscopy (colonoscopy or flexible sigmoidoscopy):  Excessive amounts of blood in the stool  Significant tenderness or worsening of abdominal pains  Swelling of the abdomen that is new, acute  Fever of 100F or higher    For urgent or emergent issues, a gastroenterologist can be reached at any hour by calling [phone].   DIET:  We do recommend a small meal at first, but then you may proceed to your regular diet.  Drink plenty of fluids but you should avoid alcoholic beverages for 24 hours.  ACTIVITY:  You should plan to take it easy for the rest of today and you should NOT DRIVE or use heavy machinery until tomorrow (because of the sedation medicines used during the test).     FOLLOW UP: Our staff will call the number listed on your records the next business day following your procedure to check on you and address any questions or concerns that you may have regarding the information given to you following your procedure. If we do not reach you, we will leave a message.  However, if you are feeling well and you are not experiencing any problems, there is no need to return our call.  We will assume that you have returned to your regular daily activities without incident.  If any biopsies were taken you will be contacted by phone or by letter within the next 1-3 weeks.  Please call us at [phone] if you have not heard about the biopsies in 3 weeks.    SIGNATURES/CONFIDENTIALITY: You and/or your care partner have signed paperwork which will be entered into your electronic medical record.  These signatures attest to the fact that that the information above on your After Visit Summary has been reviewed and is understood.  Full responsibility of the confidentiality of this discharge information lies with you and/or your care-partner.   No aspirin,ibuprofen,naproxen,or other non-steroidal anti-inflammatory drugs for 2 weeks,resume coumadin at prior dose in 2 days,resume remainder of medications. Information given on polyps and hemorrhoids.

## 2016-12-04 NOTE — Progress Notes (Signed)
Report to PACU, RN, vss, BBS= Clear.  

## 2016-12-04 NOTE — Op Note (Signed)
[facility] Patient Name: Joshua [name] Procedure Date: 12/04/2016 2:04 PM MRN: [id number] Endoscopist: Ladene Artist , MD Age: 71 Referring MD:  Date of Birth: 1946-01-18 Gender: Male Account #: [id number] Procedure:                Colonoscopy Indications:              Surveillance: Personal history of adenomatous                            polyps on last colonoscopy 5 years ago Medicines:                Monitored Anesthesia Care Procedure:                Pre-Anesthesia Assessment:                           - Prior to the procedure, a History and Physical                            was performed, and patient medications and                            allergies were reviewed. The patient's tolerance of                            previous anesthesia was also reviewed. The risks                            and benefits of the procedure and the sedation                            options and risks were discussed with the patient.                            All questions were answered, and informed consent                            was obtained. Prior Anticoagulants: The patient has                            taken Coumadin (warfarin), last dose was 5 days                            prior to procedure. ASA Grade Assessment: III - A                            patient with severe systemic disease. After                            reviewing the risks and benefits, the patient was                            deemed in satisfactory condition to undergo the  procedure.                           After obtaining informed consent, the colonoscope                            was passed under direct vision. Throughout the                            procedure, the patient's blood pressure, pulse, and                            oxygen saturations were monitored continuously. The                            Model PCF-[id number]) scope was introduced                 through the anus and advanced to the the cecum,                            identified by appendiceal orifice and ileocecal                            valve. The ileocecal valve, appendiceal orifice,                            and rectum were photographed. The quality of the                            bowel preparation was good. The colonoscopy was                            performed without difficulty. The patient tolerated                            the procedure well. Scope In: 2:09:04 PM Scope Out: 2:23:56 PM Scope Withdrawal Time: 0 hours 12 minutes 20 seconds  Total Procedure Duration: 0 hours 14 minutes 52 seconds  Findings:                 The perianal and digital rectal examinations were                            normal.                           A 6 mm polyp was found in the transverse colon. The                            polyp was sessile. The polyp was removed with a                            cold snare. Resection and retrieval were complete.  Internal hemorrhoids were found during                            retroflexion. The hemorrhoids were medium-sized and                            Grade I (internal hemorrhoids that do not prolapse).                           The exam was otherwise without abnormality on                            direct and retroflexion views. Complications:            No immediate complications. Estimated blood loss:                            None. Estimated Blood Loss:     Estimated blood loss: none. Impression:               - One 6 mm polyp in the transverse colon, removed                            with a cold snare. Resected and retrieved.                           - Internal hemorrhoids.                           - The examination was otherwise normal on direct                            and retroflexion views. Recommendation:           - Repeat colonoscopy in 5 years for surveillance.                            - Resume Coumadin (warfarin) in 2 days at prior                            dose. Refer to managing physician for further                            adjustment of therapy.                           - Patient has a contact number available for                            emergencies. The signs and symptoms of potential                            delayed complications were discussed with the                            patient. Return to normal activities tomorrow.  Written discharge instructions were provided to the                            patient.                           - Resume previous diet.                           - Continue present medications.                           - Await pathology results.                           - No aspirin, ibuprofen, naproxen, or other                            non-steroidal anti-inflammatory drugs for 2 weeks                            after polyp removal. Ladene Artist, MD 12/04/2016 2:27:52 PM This report has been signed electronically.

## 2016-12-04 NOTE — Progress Notes (Signed)
Called to room to assist during endoscopic procedure.  Patient ID and intended procedure confirmed with present staff. Received instructions for my participation in the procedure from the performing physician.  

## 2016-12-05 ENCOUNTER — Telehealth: Payer: Self-pay | Admitting: Family Medicine

## 2016-12-05 ENCOUNTER — Telehealth: Payer: Self-pay | Admitting: *Deleted

## 2016-12-05 NOTE — Telephone Encounter (Signed)
Pt aware to pick up

## 2016-12-05 NOTE — Telephone Encounter (Signed)
  Follow up Call-  Call back number 12/04/2016  Post procedure Call Back phone  # [phone]  Permission to leave phone message Yes  Some recent data might be hidden     Patient questions:  Do you have a fever, pain , or abdominal swelling? No. Pain Score  0 *  Have you tolerated food without any problems? Yes.    Have you been able to return to your normal activities? Yes.    Do you have any questions about your discharge instructions: Diet   No. Medications  No. Follow up visit  No.  Do you have questions or concerns about your Care? No.  Actions: * If pain score is 4 or above: No action needed, pain <4.

## 2016-12-09 ENCOUNTER — Encounter: Payer: Self-pay | Admitting: Gastroenterology

## 2016-12-13 ENCOUNTER — Encounter: Payer: Self-pay | Admitting: Cardiovascular Disease

## 2016-12-13 ENCOUNTER — Ambulatory Visit (INDEPENDENT_AMBULATORY_CARE_PROVIDER_SITE_OTHER): Payer: Medicare Other | Admitting: Cardiovascular Disease

## 2016-12-13 ENCOUNTER — Encounter: Payer: Self-pay | Admitting: Family Medicine

## 2016-12-13 VITALS — BP 152/52 | HR 59 | Ht 68.5 in | Wt 190.0 lb

## 2016-12-13 DIAGNOSIS — I351 Nonrheumatic aortic (valve) insufficiency: Secondary | ICD-10-CM | POA: Diagnosis not present

## 2016-12-13 DIAGNOSIS — I421 Obstructive hypertrophic cardiomyopathy: Secondary | ICD-10-CM | POA: Diagnosis not present

## 2016-12-13 DIAGNOSIS — I2510 Atherosclerotic heart disease of native coronary artery without angina pectoris: Secondary | ICD-10-CM | POA: Diagnosis not present

## 2016-12-13 DIAGNOSIS — I519 Heart disease, unspecified: Secondary | ICD-10-CM | POA: Diagnosis not present

## 2016-12-13 DIAGNOSIS — E785 Hyperlipidemia, unspecified: Secondary | ICD-10-CM | POA: Diagnosis not present

## 2016-12-13 DIAGNOSIS — I480 Paroxysmal atrial fibrillation: Secondary | ICD-10-CM | POA: Diagnosis not present

## 2016-12-13 DIAGNOSIS — D369 Benign neoplasm, unspecified site: Secondary | ICD-10-CM | POA: Insufficient documentation

## 2016-12-13 DIAGNOSIS — I5189 Other ill-defined heart diseases: Secondary | ICD-10-CM

## 2016-12-13 MED ORDER — HYDROCHLOROTHIAZIDE 25 MG PO TABS: 12.5000 mg | ORAL_TABLET | ORAL | 3 refills | Status: DC | PRN

## 2016-12-13 MED ORDER — LOSARTAN POTASSIUM 50 MG PO TABS: 50.0000 mg | ORAL_TABLET | Freq: Every day | ORAL | 3 refills | Status: DC

## 2016-12-13 NOTE — Progress Notes (Signed)
Patient ID: Joshua [name], male   DOB: 06-07-1945, 71 y.o.   MRN: [id number]      HPI: Joshua [name] is a 71 y.o. male presents to the office for an 54 month cardiology evaluation.  Joshua [name] has a history of hypertrophic obstructive cardiomyopathy, CAD and is status post stenting of his proximal RCA and has distal PDA occlusion with left-to-right collaterals as well as stenting of his proximal LAD. His last cardiac catheterization in 2007 revealed both stents patent. He  has a history of paroxysmal atrial fibrillation for which she's been on chronic Coumadin therapy and did have an episode of atrial flutter but has been maintaining sinus rhythm on Norvasc therapy.  His last nuclear perfusion study in April 2012 showed normal perfusion.   In the past, he has had issues with significant peripheral edema and shortness of breath, but this has stabilized.  An echo Doppler study in April 2013 confirmed severe concentric left ventricular hypertrophy. He had a resting left ventricle outflow track subvalvular gradient of 25 mm. Ejection fraction was hyperdynamic at 70%. He did have significant mitral annular calcification with mild mitral stenosis with mild MR as well as aortic valve sclerosis with mild to moderate aortic insufficiency.  A followup echo Doppler study on 05/18/2013 was eessentially unchanged from 2 years previously.  Ejection fraction is 60-65%.  His peak LVOT velocity was 2.6 m/s, there is severe asymmetric left hypertrophy with mild systolic anterior motion of the mitral valve and severe mitral annular calcification with mild MS. There is felt to be very mild aortic stenosis and aortic insufficiency. The left atrium was moderately dilated.  His PA pressure was 32 mm.  He has a history of hyperlipidemia and an NMR lipoprotein ldone by [name] 2 years ago showed continued improvement of his lipids with an LDL particle number of 763, calculated LDL 63, HDL 44, triglycerides 113, total  cholesterol 130, and small LDL particle #232.  Insulin resistance score was 46.  [name] recently completed another NMR profile in Mayt 2017.  This continued to be excellent with an LDL particle #594, total cholesterol 126, LDL cholesterol 54, triglycerides 120, small LDL particles to 43.  Insulin resistance score was 44.  In 2017while kayaking in [place] he became overheated.  He denied associated palpitations or presyncope.   On 12/12/2015 a follow-up echo Doppler study showed an EF of 55-60%.  There was severe LVH.  There were no wall motion abnormalities.  There was grade 2 diastolic dysfunction and Doppler parameters were consistent with high ventricular filling pressure.  There was mild aortic insufficiency.  There was severe mitral annular calcification with possible mild stenosis.  Left atrium was moderately dilated.  There was mild pulmonary hypertension with an estimated PA pressure 35 mm.  He had a resting LVOT gradient of 2.3 m/s with a mean gradient of 11 mmHg.   Since I last saw him in November 2017, [facility]  He is unaware of any breakthrough atrial fibrillation.  The cost of his Norpace has significantly increased.  He was initially started on this by [name] with his atrial fibrillation in the setting of his hypertrophic obstructive cardiomyopathy.  This has worked well for him and he has tolerated it without side effects.  He denies any chest pain.  He is unaware of recurrent arrhythmia.  He is on Crestor 10 mg for hyperlipidemia.  He continues to be on warfarin anticoagulation.  He is on omeprazole for GERD.  He presents for evaluation.  Past Medical History:  Diagnosis Date  . Acute lower GI bleeding    related to gastritis / viral infection  . Atrial fibrillation ([place])    persistent  . Cataract   . Cirrhosis ([place])   . COPD (chronic obstructive pulmonary disease) ([place])    PFT 05-05-2010, FEV1 .9 (26%) ratio 60  . Coronary artery  disease   . Gastritis 06-10-2006  . Hard of hearing   . Hiatal hernia 06-10-2006   EGD  . HOH (hard of hearing)   . Hx of adenomatous colonic polyps 2005, 2013   Colonoscopy  . Hypercholesteremia   . Hypercholesteremia   . Hypertension   . Hypertr obst cardiomyop    mild subaortic stenosis  . Iron deficiency anemia, unspecified   . LVH (left ventricular hypertrophy)   . Other B-complex deficiencies   . Rheumatic fever   . Thrombocytopenia ([place]) 05/2008    Past Surgical History:  Procedure Laterality Date  . CARDIAC CATHETERIZATION  05/28/2005   Widely patent coronaries and normal LV function.  Marland Kitchen CARDIAC CATHETERIZATION  08/11/2001   80% LAD stenosis successfully stented with a 3.5x69m Cypher DES postdilated to 3.925mresulting in reduction of 80% to 0%.  . CARDIAC CATHETERIZATION  01/15/2001   Focal 95% proximal RCA stenosis, stented with a [id number] multilink stent with postdilatation utilizing a 3.5x88m34mantum balloon with stenosis being reduced to 0%.  . CARDIOVASCULAR STRESS TEST  05/29/2011   No scintigraphic evidence of inducible myocardial ischemia. No ECG changes. EKG negative for ischemia.  . CAMarland KitchenDIOVERSION  May 2012   Dr. AllrRayann HemanCORONARY ANGIOPLASTY     2002-2003  . SHOULDER SURGERY    . TRANSTHORACIC ECHOCARDIOGRAM  05/29/2011   EF >70%, severe concentric LV hypertrophy, severe mitral annular calcification, mild-moderate aortic regurg,     Allergies  Allergen Reactions  . Tetracycline Other (See Comments)    Issue with stomach lining - can take with food if needed    Current Outpatient Prescriptions  Medication Sig Dispense Refill  . acetaminophen (TYLENOL) 325 MG tablet Take 650 mg by mouth as needed.     . asMarland Kitchenirin EC 81 MG tablet Take 81 mg by mouth daily.    . cholecalciferol (VITAMIN D) 1000 units tablet Take 1,000 Units by mouth daily.    . cyanocobalamin ([employer] VITAMIN B12) 1000 MCG tablet Take 1 tablet (1,000 mcg total) by mouth daily.    . disopyramide  (NORPACE) 150 MG capsule Take 2 capsules (300 mg total) by mouth 2 (two) times daily. 360 capsule 3  . hydrochlorothiazide (HYDRODIURIL) 25 MG tablet Take 0.5 tablets (12.5 mg total) by mouth as needed. 90 tablet 3  . omeprazole (PRILOSEC) 10 MG capsule Take 1 capsule (10 mg total) by mouth daily. 90 capsule 3  . polyethylene glycol (MIRALAX / GLYCOLAX) packet Take 17 g by mouth every other day.    . rosuvastatin (CRESTOR) 10 MG tablet Take 1 tablet (10 mg total) by mouth daily. 90 tablet 3  . warfarin (JANTOVEN) 4 MG tablet TAKE 1 TABLET BY MOUTH DAILY, EXCEPT ON TUES ANDFRIDAY TAKE 1 AND 1/2TAB 115 tablet 3  . losartan (COZAAR) 50 MG tablet Take 1 tablet (50 mg total) by mouth daily. 90 tablet 3   No current facility-administered medications for this visit.     Social History   Social History  . Marital status: Widowed    Spouse name: N/A  .  Number of children: 5  . Years of education: N/A   Occupational History  . retired Event organiser Retired   Social History Main Topics  . Smoking status: Former Smoker    Packs/day: 1.00    Years: 10.00    Types: Cigarettes    Quit date: 02/26/1985  . Smokeless tobacco: Never Used  . Alcohol use No  . Drug use: No  . Sexual activity: No   Other Topics Concern  . Not on file   Social History Narrative   L[facility] in [facility]   Married with two sons and three daughters    Family History  Problem Relation Age of Onset  . Heart attack Brother 68       Ventircular rupture  . Emphysema Brother   . Lung cancer Brother   . Heart disease Mother        Enlarged heart  . Stroke Mother   . Hyperlipidemia Father   . CAD Father   . Hypertension Sister   . CAD Paternal Uncle   . Stroke Paternal Uncle   . Cancer Maternal Grandfather        colon  . Colon cancer Maternal Grandfather   . Asthma Brother    Socially he tries to remain active. He does kayak. He also does work in his yard. He is widowed. He has 4 children one deceased.  One grandchild. Is no tobacco or alcohol use.  ROS General: Negative; No fevers, chills, or night sweats;  HEENT: Negative; No changes in vision or hearing, sinus congestion, difficulty swallowing Pulmonary: Negative; No cough, wheezing, shortness of breath, hemoptysis Cardiovascular: Negative; No chest pain, presyncope, syncope, palpitations GI: Negative; No nausea, vomiting, diarrhea, or abdominal pain GU: Negative; No dysuria, hematuria, or difficulty voiding Musculoskeletal: Negative; no myalgias, joint pain, or weakness Hematologic/Oncology: Positive for remote history of iron deficiency anemia no easy bruising, bleeding Endocrine: Negative; no heat/cold intolerance; no diabetes Neuro: Negative; no changes in balance, headaches Skin: Brawny lower extremity skin changes from prior chronic edema;  No rashes or skin lesions Psychiatric: Negative; No behavioral problems, depression Sleep: Negative; No snoring, daytime sleepiness, hypersomnolence, bruxism, restless legs, hypnogognic hallucinations, no cataplexy Other comprehensive 14 point system review is negative.   PE BP (!) 152/52   Pulse (!) 59   Ht 5' 8.5" (1.74 m)   Wt 190 lb (86.2 kg)   BMI 28.47 kg/m    Repeat blood pressure by me was 146/60 supine and 152/60 standing.  Wt Readings from Last 3 Encounters:  12/13/16 190 lb (86.2 kg)  12/04/16 192 lb (87.1 kg)  11/06/16 190 lb (86.2 kg)   General: Alert, oriented, no distress.  Skin: normal turgor, no rashes, warm and dry HEENT: Normocephalic, atraumatic. Pupils equal round and reactive to light; sclera anicteric; extraocular muscles intact;  Nose without nasal septal hypertrophy Mouth/Parynx benign; Mallinpatti scale 3 Neck: No JVD, no carotid bruits; normal carotid upstroke Lungs: clear to ausculatation and percussion; no wheezing or rales Chest wall: without tenderness to palpitation Heart: PMI not displaced, RRR, s1 s2 normal, 1-2 systolic murmur which decreases  going from standing to squatting increases going back to standing , consistent with HOCM, no diastolic murmur, no rubs, gallops, thrills, or heaves Abdomen: soft, nontender; no hepatosplenomehaly, BS+; abdominal aorta nontender and not dilated by palpation. Back: no CVA tenderness Pulses 2+; mild bisferens pulse width murmur transmitted to carotids Musculoskeletal: full range of motion, normal strength, no joint deformities Extremities: Venous stasis changes; no edema;  no clubbing, cyanosis , Homan's sign negative  Neurologic: grossly nonfocal; Cranial nerves grossly wnl Psychologic: Normal mood and affect   ECG (independently read by me): Sinus bradycardia at 59 bpm with first-degree AV block with PR interval at 290 ms.  Left bundle branch block.  November 2017 ECG (independently read by me): Sinus bradycardia 52 bpm.  First-degree heart block with a PR interval at 270 ms.  QTc interval 496 ms.  May 2017 ECG (independently read by me): Sinus bradycardia 59 bpm.  Nonspecific intraventricular conduction delay.  T-wave changes in 3 and aVF.  April 2016 ECG (independently read by me): Sinus bradycardia at 56 bpm.  First-degree AV block.  Left bundle branch block.  October 2015 ECG (independently read by me): Sinus rhythm at 60 beats per minute with first degree AV block with a PR interval of 236 ms.  LDH with QRS widening.  QTC 514 ms.  Prior 06/15/2013 ECG (independently read by me and (sinus rhythm at 59 beats per minute.  First degree AV block with PR interval 220 ms.  Increased QTC of 500 ms.  Interventricular conduction delay.  Prior 10/ 22/2014 ECG: Sinus rhythm with first-degree AV block with PR interval 232 ms. QT interval 495 ms.  LABS:  BMP Latest Ref Rng & Units 10/31/2016 05/03/2016 12/02/2015  Glucose 65 - 99 mg/dL 88 75 74  BUN 8 - 27 mg/dL _0 Creatinine 0.76 - 1.27 mg/dL 1.15 1.09 1.18  BUN/Creat Ratio 10 - _1 Sodium 134 - 144 mmol/L 141 144 142  Potassium 3.5  - 5.2 mmol/L 4.2 4.3 3.8  Chloride 96 - 106 mmol/L 97 98 96  CO2 20 - 29 mmol/L 31(H) 29 32(H)  Calcium 8.6 - 10.2 mg/dL 8.8 8.9 8.7    Hepatic Function Latest Ref Rng & Units 10/31/2016 05/03/2016 12/02/2015  Total Protein 6.0 - 8.5 g/dL 6.8 7.2 7.1  Albumin 3.5 - 4.8 g/dL 3.8 3.9 4.0  AST 0 - 40 IU/L 28 29 36  ALT 0 - 44 IU/L _2 Alk Phosphatase 39 - 117 IU/L 56 59 60  Total Bilirubin 0.0 - 1.2 mg/dL 0.5 0.6 0.5  Bilirubin, Direct 0.00 - 0.40 mg/dL 0.17 0.20 0.17     CBC Latest Ref Rng & Units 10/31/2016 05/03/2016 12/02/2015  WBC 3.4 - 10.8 x10E3/uL 5.0 6.0 6.6  Hemoglobin 13.0 - 17.7 g/dL 16.1 16.0 15.8  Hematocrit 37.5 - 51.0 % 46.9 48.6 48.3  Platelets 150 - 379 x10E3/uL 113(L) 142(L) 131(L)   Lab Results  Component Value Date   MCV 92 10/31/2016   MCV 95 05/03/2016   MCV 96 12/02/2015    No results found for: HGBA1C   Lab Results  Component Value Date   TSH 2.620 01/29/2014    Lab Results  Component Value Date   TSH 2.620 01/29/2014       Component Value Date/Time   PROBNP 541.0 (H) 05/05/2010 1158    Lipid Panel     Component Value Date/Time   CHOL 128 10/31/2016 0850   CHOL 126 08/20/2012 0930   TRIG 113 10/31/2016 0850   TRIG 94 08/20/2012 0930   HDL 46 10/31/2016 0850   HDL 41 08/20/2012 0930   LDLCALC 63 09/29/2013 0907   LDLCALC 66 08/20/2012 0930     RADIOLOGY: No results found.  IMPRESSION:  1. Hypertrophic obstructive cardiomyopathy ([place])   2. PAF (paroxysmal atrial fibrillation) ([place])   3. CAD in native  artery   4. Hyperlipidemia LDL goal <70   5. Aortic valve insufficiency, etiology of cardiac valve disease unspecified   6. Grade II diastolic dysfunction      ASSESSMENT AND PLAN: Mr. Jordon [name] is a 71 year old male who has a history of hypertrophic obstructive cardiomyopathy and his gradients have remained stable on subsequent echocardiographic evaluations.  He  is status post two-vessel coronary intervention and at last  catheterization a patent stents in his LAD and proximal right coronary artery.  He had developed atrial fibrillation for which she was started on chronic warfarin therapy and had an episode of atrial flutter which is stabilized on Norvasc therapy, started by [name] in light of his HOCM.  I believe this is the best antiarrhythmic med for him and he has not had any recurrence of arrhythmia and has tolerated it with reference to his obstructive cardiomyopathy.  I do not believe he is a  candidate for amiodarone due to prior lung issues.  He is maintaining sinus rhythm on Norpace 150 mg twice a day and has not had any breakthrough recurrent atrial fibrillation or atrial flutter.  His blood pressure today is elevated and I have suggested the addition of low-dose losartan 25 mg for 2 weeks and then he will titrate this to 50 mg daily for more optimal control.  He does not have any signs of edema and I have recommended he discontinue HCTZ since a be important to avoid volume contraction, which may augment his HOCM gradient. He has not had any recent anginal symptoms.   His last nuclear perfusion study in April 2013 continued to show fairly normal perfusion.  I again reviewed his echo from October 2017 which showed normal systolic function, grade 2 diastolic dysfunction with elevated LV filling pressure, severe LVH with narrow LVOT with mild elevated gradient at rest with a mean gradient 11, mild AR, severe mitral annular calcification with mild MS and trace MR, moderate left atrial enlargement, trace TR and mildly elevated pulmonary pressures.  There was no assessment of gradient assessment with provocative maneuvers.   He continues to be on warfarin for anticoagulation.  He continues to be on Crestor 10 mg for hyperlipidemia.  [name] has done advanced lipid testing and LDL was 59  In September 2018 with an LDL particle number in the optimal range at 804.  He will monitor his blood pressure.  I will see him in 3  months for reevaluation.  Time spent: 25 minutes  Troy Sine, MD, [facility]  12/15/2016 2:42 PM

## 2016-12-13 NOTE — Patient Instructions (Signed)
Medication Instructions:  Take HCTZ 12.5 mg (1/2 tablet) AS NEEDED  START losartan  --take 1/2 tablet (25 mg ) daily x 2 weeks --If you are tolerating okay, increase to 50 mg (1 tablet) daily  Follow-Up: Your physician recommends that you schedule a follow-up appointment in: 3 months with Dr. Claiborne Billings.    Any Other Special Instructions Will Be Listed Below (If Applicable).     If you need a refill on your cardiac medications before your next appointment, please call your pharmacy.

## 2016-12-14 ENCOUNTER — Ambulatory Visit (INDEPENDENT_AMBULATORY_CARE_PROVIDER_SITE_OTHER): Payer: Medicare Other | Admitting: Pharmacist Clinician (PhC)/ Clinical Pharmacy Specialist

## 2016-12-14 DIAGNOSIS — Z23 Encounter for immunization: Secondary | ICD-10-CM

## 2016-12-14 DIAGNOSIS — I4891 Unspecified atrial fibrillation: Secondary | ICD-10-CM

## 2016-12-14 DIAGNOSIS — I480 Paroxysmal atrial fibrillation: Secondary | ICD-10-CM

## 2016-12-14 DIAGNOSIS — Z7901 Long term (current) use of anticoagulants: Secondary | ICD-10-CM

## 2016-12-14 LAB — COAGUCHEK XS/INR WAIVED
INR: 1.7 — ABNORMAL HIGH (ref 0.9–1.1)
Prothrombin Time: 19.8 s

## 2016-12-14 NOTE — Patient Instructions (Signed)
Anticoagulation Warfarin Dose Instructions as of 12/14/2016      [name] Tue Wed Thu Fri Sat   New Dose 4 mg 6 mg 4 mg 4 mg 6 mg 4 mg 4 mg    Description   Take 6mg  today and then resume your regular schedule tomorrow.  INR was 1.7 today (goal: 2.0 to 3.0)

## 2017-01-08 ENCOUNTER — Ambulatory Visit (INDEPENDENT_AMBULATORY_CARE_PROVIDER_SITE_OTHER): Payer: Medicare Other | Admitting: *Deleted

## 2017-01-08 VITALS — BP 148/59 | HR 57 | Ht 66.5 in | Wt 196.0 lb

## 2017-01-08 DIAGNOSIS — Z0000 Encounter for general adult medical examination without abnormal findings: Secondary | ICD-10-CM

## 2017-01-08 DIAGNOSIS — Z7901 Long term (current) use of anticoagulants: Secondary | ICD-10-CM

## 2017-01-08 DIAGNOSIS — I4891 Unspecified atrial fibrillation: Secondary | ICD-10-CM

## 2017-01-08 DIAGNOSIS — I480 Paroxysmal atrial fibrillation: Secondary | ICD-10-CM

## 2017-01-08 LAB — COAGUCHEK XS/INR WAIVED
INR: 2.3 — ABNORMAL HIGH (ref 0.9–1.1)
Prothrombin Time: 28 s

## 2017-01-08 NOTE — Patient Instructions (Signed)
  Mr. Carlberg , Thank you for taking time to come for your Medicare Wellness Visit. I appreciate your ongoing commitment to your health goals. Please review the following plan we discussed and let me know if I can assist you in the future.   These are the goals we discussed: Continue to stay active daily  This is a list of the screening recommended for you and due dates:  Health Maintenance  Topic Date Due  . Tetanus Vaccine  10/25/2020  . Colon Cancer Screening  12/04/2021  . Flu Shot  Completed  .  Hepatitis C: One time screening is recommended by Center for Disease Control  (CDC) for  adults born from [birth year] through 1966.   Completed  . Pneumonia vaccines  Completed

## 2017-01-08 NOTE — Progress Notes (Addendum)
Subjective:   Joshua [name] is a 71 y.o. male who presents for a subsequent Medicare Annual Wellness Visit. Joshua [name] wife passed away years ago. He does have a girlfriend now that he enjoys spending time with. [place] is from [place] and spent time in [place]. He has 5 adult children and lives at home alone.   Review of Systems    Health is about the same as last year.   Cardiac Risk Factors include: advanced age (>20men, >89 women);hypertension;obesity (BMI >30kg/m2);sedentary lifestyle;family history of premature cardiovascular disease;male gender  Other systems negative today.    Objective:    Today's Vitals   01/08/17 1018  BP: (!) 148/59  Pulse: (!) 57  Weight: 196 lb (88.9 kg)  Height: 5' 6.5" (1.689 m)   Body mass index is 31.16 kg/m.  Current Medications (verified) Outpatient Encounter Medications as of 01/08/2017  Medication Sig  . acetaminophen (TYLENOL) 325 MG tablet Take 650 mg by mouth as needed.   Marland Kitchen aspirin EC 81 MG tablet Take 81 mg by mouth daily.  . cholecalciferol (VITAMIN D) 1000 units tablet Take 1,000 Units by mouth daily.  . cyanocobalamin ([facility]S VITAMIN B12) 1000 MCG tablet Take 1 tablet (1,000 mcg total) by mouth daily.  . disopyramide (NORPACE) 150 MG capsule Take 2 capsules (300 mg total) by mouth 2 (two) times daily.  . hydrochlorothiazide (HYDRODIURIL) 25 MG tablet Take 0.5 tablets (12.5 mg total) by mouth as needed.  Marland Kitchen losartan (COZAAR) 50 MG tablet Take 1 tablet (50 mg total) by mouth daily.  Marland Kitchen omeprazole (PRILOSEC) 10 MG capsule Take 1 capsule (10 mg total) by mouth daily. (Patient taking differently: Take 20 mg every other day by mouth. )  . polyethylene glycol (MIRALAX / GLYCOLAX) packet Take 17 g by mouth every other day.  . rosuvastatin (CRESTOR) 10 MG tablet Take 1 tablet (10 mg total) by mouth daily.  Marland Kitchen warfarin (JANTOVEN) 4 MG tablet TAKE 1 TABLET BY MOUTH DAILY, EXCEPT ON TUES ANDFRIDAY TAKE 1 AND 1/2TAB   No  facility-administered encounter medications on file as of 01/08/2017.     Allergies (verified) Tetracycline   History: Past Medical History:  Diagnosis Date  . Acute lower GI bleeding    related to gastritis / viral infection  . Atrial fibrillation ([place])    persistent  . Cataract   . Cirrhosis ([place])   . COPD (chronic obstructive pulmonary disease) ([place])    PFT 05-05-2010, FEV1 .9 (26%) ratio 60  . Coronary artery disease   . Gastritis 06-10-2006  . Hard of hearing   . Hiatal hernia 06-10-2006   EGD  . HOH (hard of hearing)   . Hx of adenomatous colonic polyps 2005, 2013   Colonoscopy  . Hypercholesteremia   . Hypercholesteremia   . Hypertension   . Hypertr obst cardiomyop    mild subaortic stenosis  . Iron deficiency anemia, unspecified   . LVH (left ventricular hypertrophy)   . Other B-complex deficiencies   . Rheumatic fever   . Thrombocytopenia ([place]) 05/2008   Past Surgical History:  Procedure Laterality Date  . CARDIAC CATHETERIZATION  05/28/2005   Widely patent coronaries and normal LV function.  Marland Kitchen CARDIAC CATHETERIZATION  08/11/2001   80% LAD stenosis successfully stented with a 3.5x7mm Cypher DES postdilated to 3.94mm resulting in reduction of 80% to 0%.  . CARDIAC CATHETERIZATION  01/15/2001   Focal 95% proximal RCA stenosis, stented with a 3x62mm Zeta multilink stent with postdilatation  utilizing a 3.5x58mm Quantum balloon with stenosis being reduced to 0%.  . CARDIOVASCULAR STRESS TEST  05/29/2011   No scintigraphic evidence of inducible myocardial ischemia. No ECG changes. EKG negative for ischemia.  Marland Kitchen CARDIOVERSION  May 2012   [name]  . CORONARY ANGIOPLASTY     2002-2003  . SHOULDER SURGERY    . TRANSTHORACIC ECHOCARDIOGRAM  05/29/2011   EF >70%, severe concentric LV hypertrophy, severe mitral annular calcification, mild-moderate aortic regurg,    Family History  Problem Relation Age of Onset  . Heart attack Brother 32       Ventircular rupture  .  Emphysema Brother   . Lung cancer Brother   . Heart disease Mother        Enlarged heart  . Stroke Mother   . Hyperlipidemia Father   . CAD Father   . Hypertension Sister   . CAD Paternal Uncle   . Stroke Paternal Uncle   . Cancer Maternal Grandfather        colon  . Colon cancer Maternal Grandfather   . Asthma Brother    Social History   Occupational History  . 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 Smoking status: Former Smoker    Packs/day: 1.00    Years: 10.00    Pack years: 10.00    Types: Cigarettes    Last attempt to quit: 02/26/1985    Years since quitting: 31.8  . Smokeless tobacco: Never Used  Substance and Sexual Activity  . Alcohol use: No  . Drug use: No  . Sexual activity: No   Tobacco Counseling No tobacco use  Activities of Daily Living In your present state of health, do you have any difficulty performing the following activities: 01/08/2017  Hearing? N  Comment Wears hearing aids and hears well with them  Vision? N  Difficulty concentrating or making decisions? N  Walking or climbing stairs? N  Dressing or bathing? N  Doing errands, shopping? N  Preparing Food and eating ? N  Using the Toilet? N  In the past six months, have you accidently leaked urine? N  Do you have problems with loss of bowel control? N  Managing your Medications? N  Managing your Finances? N  Housekeeping or managing your Housekeeping? N  Some recent data might be hidden    Immunizations and Health Maintenance Immunization History  Administered Date(s) Administered  . Influenza Split 11/25/2012  . Influenza Whole 10/27/2009  . Influenza, High Dose Seasonal PF 12/02/2015, 12/14/2016  . Influenza,inj,Quad PF,6+ Mos 12/14/2014  . Influenza-Unspecified 11/10/2013  . Pneumococcal Conjugate-13 01/29/2013  . Pneumococcal Polysaccharide-23 04/27/2011  . Tdap 10/26/2010   There are no preventive care reminders to display for this  patient.  Patient Care Team: Chipper Herb, MD as PCP - General (Family Medicine) Troy Sine, MD as Consulting Physician (Cardiology) Ladene Artist, MD as Consulting Physician (Gastroenterology) Steffanie Rainwater, DPM as Consulting Physician (Podiatry)  No hospitalizations, ER visits, or surgeries this past year.     Assessment:   This is a routine wellness examination for Pranav.   Hearing/Vision screen No deficit noted during visit. Eye exam is up to date.   Dietary issues and exercise activities discussed: Current Exercise Habits: The patient does not participate in regular exercise at present  Goals Exercise for at least 150 minutes a week.   Depression Screen PHQ 2/9 Scores 01/08/2017 11/06/2016 05/03/2016 04/19/2016  PHQ - 2 Score 0 0  0 0  PHQ- 9 Score - - - 1    Fall Risk Fall Risk  01/08/2017 11/06/2016 05/03/2016 01/05/2016 12/02/2015  Falls in the past year? No No No No No  Number falls in past yr: - - - - -  Comment - - - - -    Cognitive Function: MMSE - Mini Mental State Exam 01/08/2017 01/05/2016 11/24/2014  Orientation to time 5 5 5   Orientation to Place 5 5 5   Registration 3 3 3   Attention/ Calculation 4 3 2   Recall 3 3 3   Language- name 2 objects 2 2 2   Language- repeat 1 1 1   Language- follow 3 step command 3 3 3   Language- read & follow direction 1 1 1   Write a sentence 1 1 1   Copy design 1 1 1   Total score 29 28 27         Screening Tests Health Maintenance  Topic Date Due  . TETANUS/TDAP  10/25/2020  . COLONOSCOPY  12/04/2021  . INFLUENZA VACCINE  Completed  . Hepatitis C Screening  Completed  . PNA vac Low Risk Adult  Completed        Plan:  Keep f/u with PCP Exercise for at least 150 minutes a week   I have personally reviewed and noted the following in the patient's chart:   . Medical and social history . Use of alcohol, tobacco or illicit drugs  . Current medications and supplements . Functional ability and status . Nutritional  status . Physical activity . Advanced directives . List of other physicians . Hospitalizations, surgeries, and ER visits in previous 12 months . Vitals . Screenings to include cognitive, depression, and falls . Referrals and appointments  In addition, I have reviewed and discussed with patient certain preventive protocols, quality metrics, and best practice recommendations. A written personalized care plan for preventive services as well as general preventive health recommendations were provided to patient.     Chong Sicilian, RN   01/08/2017   I have reviewed and agree with the above AWV documentation.   Mary-Margaret Hassell Done, FNP

## 2017-01-09 ENCOUNTER — Encounter: Payer: Self-pay | Admitting: *Deleted

## 2017-01-25 ENCOUNTER — Encounter: Payer: Self-pay | Admitting: Pharmacist Clinician (PhC)/ Clinical Pharmacy Specialist

## 2017-02-08 ENCOUNTER — Ambulatory Visit (INDEPENDENT_AMBULATORY_CARE_PROVIDER_SITE_OTHER): Payer: Medicare Other | Admitting: Pharmacist Clinician (PhC)/ Clinical Pharmacy Specialist

## 2017-02-08 DIAGNOSIS — Z7901 Long term (current) use of anticoagulants: Secondary | ICD-10-CM | POA: Diagnosis not present

## 2017-02-08 DIAGNOSIS — I4891 Unspecified atrial fibrillation: Secondary | ICD-10-CM | POA: Diagnosis not present

## 2017-02-08 DIAGNOSIS — I480 Paroxysmal atrial fibrillation: Secondary | ICD-10-CM | POA: Diagnosis not present

## 2017-02-08 LAB — COAGUCHEK XS/INR WAIVED
INR: 2.4 — ABNORMAL HIGH (ref 0.9–1.1)
Prothrombin Time: 29.3 s

## 2017-02-21 ENCOUNTER — Encounter: Payer: Self-pay | Admitting: *Deleted

## 2017-02-22 ENCOUNTER — Encounter: Payer: Self-pay | Admitting: Physician Assistant

## 2017-02-22 ENCOUNTER — Ambulatory Visit: Payer: Medicare Other | Admitting: Physician Assistant

## 2017-02-22 VITALS — BP 136/53 | HR 65 | Temp 97.1°F | Ht 66.5 in | Wt 193.0 lb

## 2017-02-22 DIAGNOSIS — B356 Tinea cruris: Secondary | ICD-10-CM

## 2017-02-22 MED ORDER — GRISEOFULVIN MICROSIZE 500 MG PO TABS: 500.0000 mg | ORAL_TABLET | Freq: Every day | ORAL | 1 refills | Status: DC

## 2017-02-22 NOTE — Patient Instructions (Signed)
Skin Yeast Infection Skin yeast infection is a condition in which there is an overgrowth of yeast (candida) that normally lives on the skin. This condition usually occurs in areas of the skin that are constantly warm and moist, such as the armpits or the groin. What are the causes? This condition is caused by a change in the normal balance of the yeast and bacteria that live on the skin. What increases the risk? This condition is more likely to develop in:  People who are obese.  Pregnant women.  Women who take birth control pills.  People who have diabetes.  People who take antibiotic medicines.  People who take steroid medicines.  People who are malnourished.  People who have a weak defense (immune) system.  People who are 65 years of age or older.  What are the signs or symptoms? Symptoms of this condition include:  A red, swollen area of the skin.  Bumps on the skin.  Itchiness.  How is this diagnosed? This condition is diagnosed with a medical history and physical exam. Your health care provider may check for yeast by taking light scrapings of the skin to be viewed under a microscope. How is this treated? This condition is treated with medicine. Medicines may be prescribed or be available over-the-counter. The medicines may be:  Taken by mouth (orally).  Applied as a cream.  Follow these instructions at home:  Take or apply over-the-counter and prescription medicines only as told by your health care provider.  Eat more yogurt. This may help to keep your yeast infection from returning.  Maintain a healthy weight. If you need help losing weight, talk with your health care provider.  Keep your skin clean and dry.  If you have diabetes, keep your blood sugar under control. Contact a health care provider if:  Your symptoms go away and then return.  Your symptoms do not get better with treatment.  Your symptoms get worse.  Your rash spreads.  You have a  fever or chills.  You have new symptoms.  You have new warmth or redness of your skin. This information is not intended to replace advice given to you by your health care provider. Make sure you discuss any questions you have with your health care provider. Document Released: 10/31/2010 Document Revised: 10/09/2015 Document Reviewed: 08/16/2014 Elsevier Interactive Patient Education  2018 [employer]vier Inc.  

## 2017-02-25 NOTE — Progress Notes (Signed)
BP (!) 136/53   Pulse 65   Temp (!) 97.1 F (36.2 C) (Oral)   Ht 5' 6.5" (1.689 m)   Wt 193 lb (87.5 kg)   BMI 30.68 kg/m    Subjective:    Patient ID: Joshua [name], male    DOB: 1945/04/20, 71 y.o.   MRN: [id number]  HPI: Joshua [name] is a 71 y.o. male presenting on 02/22/2017 for Rash (pt here today c/o rash for over a month and has been treated with terbinafine cream 1% but it isn't working)  He has rash in the groin and axilla that has been diagnosed as yeast.  He has not had complete healing with topical ketoconazole, or nystatin.  He has no liver issues and would agree to oral antifungal. Options include terbinafine, itraconazole, griseofulvin.  He would need to take it for 2-4 weeks and then we will recheck his liver functions to assure no issues.  Relevant past medical, surgical, family and social history reviewed and updated as indicated. Allergies and medications reviewed and updated.  Past Medical History:  Diagnosis Date  . Acute lower GI bleeding    related to gastritis / viral infection  . Atrial fibrillation ([place])    persistent  . Cataract   . Cirrhosis ([place])   . COPD (chronic obstructive pulmonary disease) ([place])    PFT 05-05-2010, FEV1 .9 (26%) ratio 60  . Coronary artery disease   . Gastritis 06-10-2006  . Hard of hearing   . Hiatal hernia 06-10-2006   EGD  . HOH (hard of hearing)   . Hx of adenomatous colonic polyps 2005, 2013   Colonoscopy  . Hypercholesteremia   . Hypercholesteremia   . Hypertension   . Hypertr obst cardiomyop    mild subaortic stenosis  . Iron deficiency anemia, unspecified   . LVH (left ventricular hypertrophy)   . Other B-complex deficiencies   . Rheumatic fever   . Thrombocytopenia ([place]) 05/2008    Past Surgical History:  Procedure Laterality Date  . CARDIAC CATHETERIZATION  05/28/2005   Widely patent coronaries and normal LV function.  Marland Kitchen CARDIAC CATHETERIZATION  08/11/2001   80% LAD stenosis successfully stented with a  3.5x27m Cypher DES postdilated to 3.968mresulting in reduction of 80% to 0%.  . CARDIAC CATHETERIZATION  01/15/2001   Focal 95% proximal RCA stenosis, stented with a 3x1354meta multilink stent with postdilatation utilizing a 3.5x88m55mantum balloon with stenosis being reduced to 0%.  . CARDIOVASCULAR STRESS TEST  05/29/2011   No scintigraphic evidence of inducible myocardial ischemia. No ECG changes. EKG negative for ischemia.  . CAMarland KitchenDIOVERSION  May 2012   Dr. AllrRayann HemanCORONARY ANGIOPLASTY     2002-2003  . SHOULDER SURGERY    . TRANSTHORACIC ECHOCARDIOGRAM  05/29/2011   EF >70%, severe concentric LV hypertrophy, severe mitral annular calcification, mild-moderate aortic regurg,     Review of Systems  Constitutional: Negative.  Negative for appetite change and fatigue.  Eyes: Negative for pain and visual disturbance.  Respiratory: Negative.  Negative for cough, chest tightness, shortness of breath and wheezing.   Cardiovascular: Negative.  Negative for chest pain, palpitations and leg swelling.  Gastrointestinal: Negative.  Negative for abdominal pain, diarrhea, nausea and vomiting.  Genitourinary: Negative.   Skin: Positive for color change and rash.  Neurological: Negative.  Negative for weakness, numbness and headaches.  Psychiatric/Behavioral: Negative.     Allergies as of 02/22/2017      Reactions   Tetracycline Other (See  Comments)   Issue with stomach lining - can take with food if needed      Medication List        Accurate as of 02/22/17 11:59 PM. Always use your most recent med list.          acetaminophen 325 MG tablet Commonly known as:  TYLENOL Take 650 mg by mouth as needed.   aspirin EC 81 MG tablet Take 81 mg by mouth daily.   cholecalciferol 1000 units tablet Commonly known as:  VITAMIN D Take 1,000 Units by mouth daily.   cyanocobalamin 1000 MCG tablet Commonly known as:  CVS VITAMIN B12 Take 1 tablet (1,000 mcg total) by mouth daily.     disopyramide 150 MG capsule Commonly known as:  NORPACE Take 2 capsules (300 mg total) by mouth 2 (two) times daily.   griseofulvin 500 MG tablet Commonly known as:  GRIFULVIN V Take 1 tablet (500 mg total) by mouth daily.   hydrochlorothiazide 25 MG tablet Commonly known as:  HYDRODIURIL Take 0.5 tablets (12.5 mg total) by mouth as needed.   losartan 50 MG tablet Commonly known as:  COZAAR Take 1 tablet (50 mg total) by mouth daily.   omeprazole 10 MG capsule Commonly known as:  PRILOSEC Take 1 capsule (10 mg total) by mouth daily.   polyethylene glycol packet Commonly known as:  MIRALAX / GLYCOLAX Take 17 g by mouth every other day.   rosuvastatin 10 MG tablet Commonly known as:  CRESTOR Take 1 tablet (10 mg total) by mouth daily.   warfarin 4 MG tablet Commonly known as:  JANTOVEN Take as directed by the anticoagulation clinic. If you are unsure how to take this medication, talk to your nurse or doctor. Original instructions:  TAKE 1 TABLET BY MOUTH DAILY, EXCEPT ON TUES ANDFRIDAY TAKE 1 AND 1/2TAB          Objective:    BP (!) 136/53   Pulse 65   Temp (!) 97.1 F (36.2 C) (Oral)   Ht 5' 6.5" (1.689 m)   Wt 193 lb (87.5 kg)   BMI 30.68 kg/m   Allergies  Allergen Reactions  . Tetracycline Other (See Comments)    Issue with stomach lining - can take with food if needed    Physical Exam  Constitutional: He appears well-developed and well-nourished. No distress.  HENT:  Head: Normocephalic and atraumatic.  Eyes: Conjunctivae and EOM are normal. Pupils are equal, round, and reactive to light.  Cardiovascular: Normal rate, regular rhythm and normal heart sounds.  Pulmonary/Chest: Effort normal and breath sounds normal. No respiratory distress.  Skin: Skin is warm and dry. Rash noted. Rash is macular and papular. There is erythema.     Psychiatric: He has a normal mood and affect. His behavior is normal.  Nursing note and vitals reviewed.   Results for  orders placed or performed in visit on 02/08/17  CoaguChek XS/INR Waived  Result Value Ref Range   INR 2.4 (H) 0.9 - 1.1   Prothrombin Time 29.3 sec      Assessment & Plan:   1. Tinea cruris - griseofulvin (GRIFULVIN V) 500 MG tablet; Take 1 tablet (500 mg total) by mouth daily.  Dispense: 30 tablet; Refill: 1 - CMP14+EGFR; Future    Current Outpatient Medications:  .  acetaminophen (TYLENOL) 325 MG tablet, Take 650 mg by mouth as needed. , Disp: , Rfl:  .  aspirin EC 81 MG tablet, Take 81 mg by mouth daily., Disp: ,  Rfl:  .  cholecalciferol (VITAMIN D) 1000 units tablet, Take 1,000 Units by mouth daily., Disp: , Rfl:  .  cyanocobalamin (CVS VITAMIN B12) 1000 MCG tablet, Take 1 tablet (1,000 mcg total) by mouth daily., Disp: , Rfl:  .  disopyramide (NORPACE) 150 MG capsule, Take 2 capsules (300 mg total) by mouth 2 (two) times daily., Disp: 360 capsule, Rfl: 3 .  griseofulvin (GRIFULVIN V) 500 MG tablet, Take 1 tablet (500 mg total) by mouth daily., Disp: 30 tablet, Rfl: 1 .  hydrochlorothiazide (HYDRODIURIL) 25 MG tablet, Take 0.5 tablets (12.5 mg total) by mouth as needed., Disp: 90 tablet, Rfl: 3 .  losartan (COZAAR) 50 MG tablet, Take 1 tablet (50 mg total) by mouth daily., Disp: 90 tablet, Rfl: 3 .  omeprazole (PRILOSEC) 10 MG capsule, Take 1 capsule (10 mg total) by mouth daily. (Patient taking differently: Take 20 mg every other day by mouth. ), Disp: 90 capsule, Rfl: 3 .  polyethylene glycol (MIRALAX / GLYCOLAX) packet, Take 17 g by mouth every other day., Disp: , Rfl:  .  rosuvastatin (CRESTOR) 10 MG tablet, Take 1 tablet (10 mg total) by mouth daily., Disp: 90 tablet, Rfl: 3 .  warfarin (JANTOVEN) 4 MG tablet, TAKE 1 TABLET BY MOUTH DAILY, EXCEPT ON TUES ANDFRIDAY TAKE 1 AND 1/2TAB, Disp: 115 tablet, Rfl: 3 Continue all other maintenance medications as listed above.  Follow up plan: recheck as needed  Educational handout given for [place] PA-C [facility] [address]  [place], [place] [address] [phone]   02/25/2017, 1:14 PM

## 2017-03-11 ENCOUNTER — Encounter: Payer: Self-pay | Admitting: Cardiovascular Disease

## 2017-03-11 ENCOUNTER — Ambulatory Visit: Payer: Medicare Other | Admitting: Cardiovascular Disease

## 2017-03-11 VITALS — BP 144/69 | HR 59 | Ht 66.5 in | Wt 191.0 lb

## 2017-03-11 DIAGNOSIS — E785 Hyperlipidemia, unspecified: Secondary | ICD-10-CM

## 2017-03-11 DIAGNOSIS — I519 Heart disease, unspecified: Secondary | ICD-10-CM

## 2017-03-11 DIAGNOSIS — I480 Paroxysmal atrial fibrillation: Secondary | ICD-10-CM

## 2017-03-11 DIAGNOSIS — I2510 Atherosclerotic heart disease of native coronary artery without angina pectoris: Secondary | ICD-10-CM

## 2017-03-11 DIAGNOSIS — I5189 Other ill-defined heart diseases: Secondary | ICD-10-CM

## 2017-03-11 DIAGNOSIS — I421 Obstructive hypertrophic cardiomyopathy: Secondary | ICD-10-CM | POA: Diagnosis not present

## 2017-03-11 NOTE — Progress Notes (Signed)
Patient ID: Joshua [name], male   DOB: 23-Mar-1945, 72 y.o.   MRN: [id number]      HPI: Joshua [name] is a 72 y.o. male presents to the office for a 3 month cardiology evaluation.  Joshua [name] has a history of hypertrophic obstructive cardiomyopathy, CAD and is status post stenting of his proximal RCA and has distal PDA occlusion with left-to-right collaterals as well as stenting of his proximal LAD. His last cardiac catheterization in 2007 revealed both stents patent. He  has a history of paroxysmal atrial fibrillation for which she's been on chronic Coumadin therapy and did have an episode of atrial flutter but has been maintaining sinus rhythm on Norvasc therapy.  His last nuclear perfusion study in April 2012 showed normal perfusion.   In the past, he has had issues with significant peripheral edema and shortness of breath, but this has stabilized.  An echo Doppler study in April 2013 confirmed severe concentric left ventricular hypertrophy. He had a resting left ventricle outflow track subvalvular gradient of 25 mm. Ejection fraction was hyperdynamic at 70%. He did have significant mitral annular calcification with mild mitral stenosis with mild MR as well as aortic valve sclerosis with mild to moderate aortic insufficiency.  A followup echo Doppler study on 05/18/2013 was eessentially unchanged from 2 years previously.  Ejection fraction is 60-65%.  His peak LVOT velocity was 2.6 m/s, there is severe asymmetric left hypertrophy with mild systolic anterior motion of the mitral valve and severe mitral annular calcification with mild MS. There is felt to be very mild aortic stenosis and aortic insufficiency. The left atrium was moderately dilated.  His PA pressure was 32 mm.  He has a history of hyperlipidemia and an NMR lipoprotein ldone by [name] 2 years ago showed continued improvement of his lipids with an LDL particle number of 763, calculated LDL 63, HDL 44, triglycerides 113, total cholesterol  130, and small LDL particle #232.  Insulin resistance score was 46.  [name] recently completed another NMR profile in Mayt 2017.  This continued to be excellent with an LDL particle #594, total cholesterol 126, LDL cholesterol 54, triglycerides 120, small LDL particles to 43.  Insulin resistance score was 44.  In 2017while kayaking in [place] he became overheated.  He denied associated palpitations or presyncope.   On 12/12/2015 a follow-up echo Doppler study showed an EF of 55-60%.  There was severe LVH.  There were no wall motion abnormalities.  There was grade 2 diastolic dysfunction and Doppler parameters were consistent with high ventricular filling pressure.  There was mild aortic insufficiency.  There was severe mitral annular calcification with possible mild stenosis.  Left atrium was moderately dilated.  There was mild pulmonary hypertension with an estimated PA pressure 35 mm.  He had a resting LVOT gradient of 2.3 m/s with a mean gradient of 11 mmHg.   When I last saw him, he had established care at the [facility].  He is unaware of any breakthrough atrial fibrillation.  The cost of his Norpace has significantly increased.  He was initially started on this by [name] with his atrial fibrillation in the setting of his hypertrophic obstructive cardiomyopathy.  This has worked well for him and he has tolerated it without side effects. .  [facility].  I recommended that he continue with Norpace.   Over the past several months, he has continued to do well.  He  has been having some muscle issues in his right back, most likely due to degenerative disc disease.  He denies any episodes of chest pain.  He is unaware of any presyncope or syncope.  He denies any change in activity or exertional dyspnea.  He continues to be on losartan 50 mg and HCTZ 12.5 mg for hypertension.  Norpace 300 mg twice a day with his HOCM and PAF, rosuvastatin 10 mg for  hyperlipidemia.  He is not had recurrent A. fib.  He continues to be on warfarin for anticoagulation.  He presents for evaluation   Past Medical History:  Diagnosis Date  . Acute lower GI bleeding    related to gastritis / viral infection  . Atrial fibrillation ([place])    persistent  . Cataract   . Cirrhosis ([place])   . COPD (chronic obstructive pulmonary disease) ([place])    PFT 05-05-2010, FEV1 .9 (26%) ratio 60  . Coronary artery disease   . Gastritis 06-10-2006  . Hard of hearing   . Hiatal hernia 06-10-2006   EGD  . HOH (hard of hearing)   . Hx of adenomatous colonic polyps 2005, 2013   Colonoscopy  . Hypercholesteremia   . Hypercholesteremia   . Hypertension   . Hypertr obst cardiomyop    mild subaortic stenosis  . Iron deficiency anemia, unspecified   . LVH (left ventricular hypertrophy)   . Other B-complex deficiencies   . Rheumatic fever   . Thrombocytopenia ([place]) 05/2008    Past Surgical History:  Procedure Laterality Date  . CARDIAC CATHETERIZATION  05/28/2005   Widely patent coronaries and normal LV function.  Marland Kitchen CARDIAC CATHETERIZATION  08/11/2001   80% LAD stenosis successfully stented with a 3.5x88m Cypher DES postdilated to 3.914mresulting in reduction of 80% to 0%.  . CARDIAC CATHETERIZATION  01/15/2001   Focal 95% proximal RCA stenosis, stented with a 3x1325meta multilink stent with postdilatation utilizing a 3.5x88m60mantum balloon with stenosis being reduced to 0%.  . CARDIOVASCULAR STRESS TEST  05/29/2011   No scintigraphic evidence of inducible myocardial ischemia. No ECG changes. EKG negative for ischemia.  . CAMarland KitchenDIOVERSION  May 2012   Dr. AllrRayann HemanCORONARY ANGIOPLASTY     2002-2003  . SHOULDER SURGERY    . TRANSTHORACIC ECHOCARDIOGRAM  05/29/2011   EF >70%, severe concentric LV hypertrophy, severe mitral annular calcification, mild-moderate aortic regurg,     Allergies  Allergen Reactions  . Tetracycline Other (See Comments)    Issue with stomach lining -  can take with food if needed    Current Outpatient Medications  Medication Sig Dispense Refill  . acetaminophen (TYLENOL) 325 MG tablet Take 650 mg by mouth as needed.     . asMarland Kitchenirin EC 81 MG tablet Take 81 mg by mouth daily.    . cholecalciferol (VITAMIN D) 1000 units tablet Take 1,000 Units by mouth daily.    . cyanocobalamin ([employer] VITAMIN B12) 1000 MCG tablet Take 1 tablet (1,000 mcg total) by mouth daily.    . disopyramide (NORPACE) 150 MG capsule Take 2 capsules (300 mg total) by mouth 2 (two) times daily. 360 capsule 3  . griseofulvin (GRIFULVIN V) 500 MG tablet Take 1 tablet (500 mg total) by mouth daily. 30 tablet 1  . hydrochlorothiazide (HYDRODIURIL) 25 MG tablet Take 0.5 tablets (12.5 mg total) by mouth as needed. 90 tablet 3  . losartan (COZAAR) 50 MG tablet Take 1 tablet (50 mg total) by mouth daily. 90 tablet 3  .  omeprazole (PRILOSEC) 10 MG capsule Take 1 capsule (10 mg total) by mouth daily. (Patient taking differently: Take 20 mg every other day by mouth. ) 90 capsule 3  . polyethylene glycol (MIRALAX / GLYCOLAX) packet Take 17 g by mouth every other day.    . rosuvastatin (CRESTOR) 10 MG tablet Take 1 tablet (10 mg total) by mouth daily. 90 tablet 3  . warfarin (JANTOVEN) 4 MG tablet TAKE 1 TABLET BY MOUTH DAILY, EXCEPT ON TUES ANDFRIDAY TAKE 1 AND 1/2TAB 115 tablet 3   No current facility-administered medications for this visit.     Social History   Socioeconomic History  . Marital status: Widowed    Spouse name: Not on file  . Number of children: 5  . Years of education: Not on file  . Highest education level: Not on file  Social Needs  . Financial resource strain: Not hard at all  . Food insecurity - worry: Never true  . Food insecurity - inability: Never true  . Transportation needs - medical: No  . Transportation needs - non-medical: No  Occupational History  . 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 Smoking status: Former  Smoker    Packs/day: 1.00    Years: 10.00    Pack years: 10.00    Types: Cigarettes    Last attempt to quit: 02/26/1985    Years since quitting: 32.0  . Smokeless tobacco: Never Used  Substance and Sexual Activity  . Alcohol use: No  . Drug use: No  . Sexual activity: No  Other Topics Concern  . Not on file  Social History Narrative  . Not on file    Family History  Problem Relation Age of Onset  . Heart attack Brother 51       Ventircular rupture  . Emphysema Brother   . Lung cancer Brother   . Heart disease Mother        Enlarged heart  . Stroke Mother   . Hyperlipidemia Father   . CAD Father   . Hypertension Sister   . CAD Paternal Uncle   . Stroke Paternal Uncle   . Cancer Maternal Grandfather        colon  . Colon cancer Maternal Grandfather   . Asthma Brother    Socially he tries to remain active. He does kayak. He also does work in his yard. He is widowed. He has 4 children one deceased. One grandchild. Is no tobacco or alcohol use.  ROS General: Negative; No fevers, chills, or night sweats;  HEENT: Negative; No changes in vision or hearing, sinus congestion, difficulty swallowing Pulmonary: Negative; No cough, wheezing, shortness of breath, hemoptysis Cardiovascular: Negative; No chest pain, presyncope, syncope, palpitations GI: Negative; No nausea, vomiting, diarrhea, or abdominal pain GU: Negative; No dysuria, hematuria, or difficulty voiding Musculoskeletal: Right back pain Hematologic/Oncology: Positive for remote history of iron deficiency anemia no easy bruising, bleeding Endocrine: Negative; no heat/cold intolerance; no diabetes Neuro: Negative; no changes in balance, headaches Skin: Brawny lower extremity skin changes from prior chronic edema;  No rashes or skin lesions Psychiatric: Negative; No behavioral problems, depression Sleep: Negative; No snoring, daytime sleepiness, hypersomnolence, bruxism, restless legs, hypnogognic hallucinations, no  cataplexy Other comprehensive 14 point system review is negative.   PE BP (!) 144/69   Pulse (!) 59   Ht 5' 6.5" (1.689 m)   Wt 191 lb (86.6 kg)   BMI 30.37 kg/m    Repeat blood  pressure by me was 130/72  Wt Readings from Last 3 Encounters:  03/11/17 191 lb (86.6 kg)  02/22/17 193 lb (87.5 kg)  01/08/17 196 lb (88.9 kg)   General: Alert, oriented, no distress.  Skin: normal turgor, no rashes, warm and dry HEENT: Normocephalic, atraumatic. Pupils equal round and reactive to light; sclera anicteric; extraocular muscles intact;  Nose without nasal septal hypertrophy Mouth/Parynx benign; Mallinpatti scale 3 Neck: No JVD, no carotid bruits; normal carotid upstroke Lungs: clear to ausculatation and percussion; no wheezing or rales Chest wall: without tenderness to palpitation Heart: PMI not displaced, RRR, s1 s2 normal, 1/6 systolic murmur, no diastolic murmur, no rubs, gallops, thrills, or heaves Abdomen: soft, nontender; no hepatosplenomehaly, BS+; abdominal aorta nontender and not dilated by palpation. Back: no CVA tenderness Pulses 2+ mild sprains pulse with murmur transmitted to carotids Musculoskeletal: full range of motion, normal strength, no joint deformities Extremities: Venous changes lower extremity, no edema; no clubbing, cyanosis, Homan's sign negative  Neurologic: grossly nonfocal; Cranial nerves grossly wnl Psychologic: Normal mood and affect   ECG (independently read by me): Sinus bradycardia 59 bpm.  First degree block with a PR interval at 274 ms.  Left bundle branch block.  October 2018 ECG (independently read by me): Sinus bradycardia at 59 bpm with first-degree AV block with PR interval at 290 ms.  Left bundle branch block.  November 2017 ECG (independently read by me): Sinus bradycardia 52 bpm.  First-degree heart block with a PR interval at 270 ms.  QTc interval 496 ms.  May 2017 ECG (independently read by me): Sinus bradycardia 59 bpm.  Nonspecific  intraventricular conduction delay.  T-wave changes in 3 and aVF.  April 2016 ECG (independently read by me): Sinus bradycardia at 56 bpm.  First-degree AV block.  Left bundle branch block.  October 2015 ECG (independently read by me): Sinus rhythm at 60 beats per minute with first degree AV block with a PR interval of 236 ms.  LDH with QRS widening.  QTC 514 ms.  Prior 06/15/2013 ECG (independently read by me and (sinus rhythm at 59 beats per minute.  First degree AV block with PR interval 220 ms.  Increased QTC of 500 ms.  Interventricular conduction delay.  Prior 10/ 22/2014 ECG: Sinus rhythm with first-degree AV block with PR interval 232 ms. QT interval 495 ms.  LABS:  BMP Latest Ref Rng & Units 10/31/2016 05/03/2016 12/02/2015  Glucose 65 - 99 mg/dL 88 75 74  BUN 8 - 27 mg/dL _0 Creatinine 0.76 - 1.27 mg/dL 1.15 1.09 1.18  BUN/Creat Ratio 10 - _1 Sodium 134 - 144 mmol/L 141 144 142  Potassium 3.5 - 5.2 mmol/L 4.2 4.3 3.8  Chloride 96 - 106 mmol/L 97 98 96  CO2 20 - 29 mmol/L 31(H) 29 32(H)  Calcium 8.6 - 10.2 mg/dL 8.8 8.9 8.7    Hepatic Function Latest Ref Rng & Units 10/31/2016 05/03/2016 12/02/2015  Total Protein 6.0 - 8.5 g/dL 6.8 7.2 7.1  Albumin 3.5 - 4.8 g/dL 3.8 3.9 4.0  AST 0 - 40 IU/L 28 29 36  ALT 0 - 44 IU/L _2 Alk Phosphatase 39 - 117 IU/L 56 59 60  Total Bilirubin 0.0 - 1.2 mg/dL 0.5 0.6 0.5  Bilirubin, Direct 0.00 - 0.40 mg/dL 0.17 0.20 0.17     CBC Latest Ref Rng & Units 10/31/2016 05/03/2016 12/02/2015  WBC 3.4 - 10.8 x10E3/uL 5.0 6.0 6.6  Hemoglobin  13.0 - 17.7 g/dL 16.1 16.0 15.8  Hematocrit 37.5 - 51.0 % 46.9 48.6 48.3  Platelets 150 - 379 x10E3/uL 113(L) 142(L) 131(L)   Lab Results  Component Value Date   MCV 92 10/31/2016   MCV 95 05/03/2016   MCV 96 12/02/2015    No results found for: HGBA1C   Lab Results  Component Value Date   TSH 2.620 01/29/2014    Lab Results  Component Value Date   TSH 2.620 01/29/2014         Component Value Date/Time   PROBNP 541.0 (H) 05/05/2010 1158    Lipid Panel     Component Value Date/Time   CHOL 128 10/31/2016 0850   CHOL 126 08/20/2012 0930   TRIG 113 10/31/2016 0850   TRIG 94 08/20/2012 0930   HDL 46 10/31/2016 0850   HDL 41 08/20/2012 0930   LDLCALC 63 09/29/2013 0907   LDLCALC 66 08/20/2012 0930     RADIOLOGY: No results found.  IMPRESSION:  1. Hypertrophic obstructive cardiomyopathy (HCC)   2. CAD in native artery   3. PAF (paroxysmal atrial fibrillation) (HCC)   4. Hyperlipidemia LDL goal <70   5. Grade II diastolic dysfunction      ASSESSMENT AND PLAN: Joshua [name] is a 71-year-old male who has a history of hypertrophic obstructive cardiomyopathy and his gradients have remained stable on subsequent echocardiographic evaluations.  He  is status post two-vessel coronary intervention and at last catheterization a patent stents in his LAD and proximal right coronary artery.  He had developed atrial fibrillation for which she was started on chronic warfarin therapy and had an episode of atrial flutter which is stabilized on Norvasc therapy, started by Dr. Allred in light of his HOCM.  I believe this is the best antiarrhythmic med for him and he has not had any recurrence of arrhythmia and has tolerated it with reference to his obstructive cardiomyopathy.  I do not believe he is a  candidate for amiodarone due to prior lung issues.  He is maintaining sinus rhythm on Norpace 150 mg twice a day and has not had any breakthrough recurrent atrial fibrillation or atrial flutter.  When I last saw him, his blood pressure was elevated and I added low-dose losartan with titration up to 50 mg.  His blood pressure today is improved on the 50 mg losartan in addition to his 12.5 HCTZ which he rarely takes only if he has significant swelling. I again reviewed his echo from October 2017 which showed normal systolic function, grade 2 diastolic dysfunction with elevated LV  filling pressure, severe LVH with narrow LVOT with mild elevated gradient at rest with a mean gradient 11, mild AR, severe mitral annular calcification with mild MS and trace MR, moderate left atrial enlargement, trace TR and mildly elevated pulmonary pressures.  There was no assessment of gradient assessment with provocative maneuvers during that evaluation.  I will not check an echo.  Presently, but when I see him back in 6 months, he will undergo a 2 year follow-up echo Doppler assessment.  He has not had any recurrent atrial fibrillation.  He has not had bleeding issues on warfarin.  He continues to be on rosuvastatin for hyperlipidemia.  I reviewed recent laboratory done by Dr. Moore in September 2006.  LDL cholesterol was 59 with LDL particle 804.  I will see him in 6 months for reevaluation.  Time spent: 25 minutes   A. , MD, FACC  03/12/2017 6:36 PM    

## 2017-03-11 NOTE — Patient Instructions (Signed)
Medication Instructions:  Your physician recommends that you continue on your current medications as directed. Please refer to the Current Medication list given to you today.  Testing/Procedures: Your physician has requested that you have an echocardiogram in 6 months. Echocardiography is a painless test that uses sound waves to create images of your heart. It provides your doctor with information about the size and shape of your heart and how well your heart's chambers and valves are working. This procedure takes approximately one hour. There are no restrictions for this procedure. This will be done at our [address] location:  [address]  Follow-Up: Your physician wants you to follow-up in: 6 months with Dr. Kelly (after echo).  You will receive a reminder letter in the mail two months in advance. If you don't receive a letter, please call our office to schedule the follow-up appointment.   Any Other Special Instructions Will Be Listed Below (If Applicable).     If you need a refill on your cardiac medications before your next appointment, please call your pharmacy.   

## 2017-03-12 ENCOUNTER — Encounter: Payer: Self-pay | Admitting: Cardiovascular Disease

## 2017-03-22 ENCOUNTER — Ambulatory Visit (INDEPENDENT_AMBULATORY_CARE_PROVIDER_SITE_OTHER): Payer: Medicare Other | Admitting: Pharmacist Clinician (PhC)/ Clinical Pharmacy Specialist

## 2017-03-22 DIAGNOSIS — Z7901 Long term (current) use of anticoagulants: Secondary | ICD-10-CM

## 2017-03-22 DIAGNOSIS — B356 Tinea cruris: Secondary | ICD-10-CM

## 2017-03-22 DIAGNOSIS — I480 Paroxysmal atrial fibrillation: Secondary | ICD-10-CM

## 2017-03-22 DIAGNOSIS — I4891 Unspecified atrial fibrillation: Secondary | ICD-10-CM

## 2017-03-22 LAB — COAGUCHEK XS/INR WAIVED
INR: 1.3 — ABNORMAL HIGH (ref 0.9–1.1)
Prothrombin Time: 15.3 s

## 2017-03-22 NOTE — Patient Instructions (Addendum)
Description   Take 1 1/2 tablets today, then continue taking warfarin the same way  INR today is 1.3 today (too thick today)

## 2017-03-23 LAB — CMP14+EGFR
ALT: 17 IU/L (ref 0–44)
AST: 23 IU/L (ref 0–40)
Albumin/Globulin Ratio: 1.4 (ref 1.2–2.2)
Albumin: 3.9 g/dL (ref 3.5–4.8)
Alkaline Phosphatase: 53 IU/L (ref 39–117)
BUN/Creatinine Ratio: 16 (ref 10–24)
BUN: 18 mg/dL (ref 8–27)
Bilirubin Total: 0.5 mg/dL (ref 0.0–1.2)
CO2: 29 mmol/L (ref 20–29)
Calcium: 8.9 mg/dL (ref 8.6–10.2)
Chloride: 97 mmol/L (ref 96–106)
Creatinine, Ser: 1.14 mg/dL (ref 0.76–1.27)
GFR calc Af Amer: 74 mL/min/{1.73_m2} (ref >59)
GFR calc non Af Amer: 64 mL/min/{1.73_m2} (ref >59)
Globulin, Total: 2.8 g/dL (ref 1.5–4.5)
Glucose: 87 mg/dL (ref 65–99)
Potassium: 4.2 mmol/L (ref 3.5–5.2)
Sodium: 142 mmol/L (ref 134–144)
Total Protein: 6.7 g/dL (ref 6.0–8.5)

## 2017-04-11 ENCOUNTER — Encounter: Payer: Self-pay | Admitting: Cardiovascular Disease

## 2017-04-25 ENCOUNTER — Encounter: Payer: Self-pay | Admitting: Family Medicine

## 2017-04-25 ENCOUNTER — Ambulatory Visit: Payer: Medicare Other | Admitting: Family Medicine

## 2017-04-25 VITALS — BP 121/62 | HR 60 | Temp 97.5°F | Ht 66.5 in | Wt 193.0 lb

## 2017-04-25 DIAGNOSIS — B029 Zoster without complications: Secondary | ICD-10-CM | POA: Diagnosis not present

## 2017-04-25 MED ORDER — VALACYCLOVIR HCL 1 G PO TABS: 1000.0000 mg | ORAL_TABLET | Freq: Three times a day (TID) | ORAL | 0 refills | Status: DC

## 2017-04-25 NOTE — Progress Notes (Signed)
   HPI  Patient presents today with a rash.  Patient explains he has had a rash on the left side of his chin for about 3 days.  It began as a painful red area that he thought was a bug bite, then he developed another one to the lateral left area of the first and now a third lesion.  Now his left ear hurts.  He denies fever, chills, sweats.  He is tolerating food and fluids like usual  PMH: Smoking status noted ROS: Per HPI  Objective: BP 121/62   Pulse 60   Temp (!) 97.5 F (36.4 C) (Oral)   Ht 5' 6.5" (1.689 m)   Wt 193 lb (87.5 kg)   BMI 30.68 kg/m  Gen: NAD, alert, cooperative with exam HEENT: NCAT, Sclera white with no erythema or injection of the conjunctival CV: RRR, good T0/V7, 3/6 systolic murmur  Resp: CTABL, no wheezes, non-labored Ext: No edema, warm Neuro: Alert and oriented, No gross deficits  Assessment and plan:  #Shingles Treating with Valtrex No signs of ocular involvement, however he is developing tenderness in the left eye. I recommended very low threshold for calling back or seeing an eye doctor if he does develop redness of the eye.   Meds ordered this encounter  Medications  . valACYclovir (VALTREX) 1000 MG tablet    Sig: Take 1 tablet (1,000 mg total) by mouth 3 (three) times daily.    Dispense:  30 tablet    Refill:  0    Laroy Apple, MD [facility] 04/25/2017, 8:45 AM

## 2017-04-25 NOTE — Patient Instructions (Signed)
Great to see you!  Start valtrex right away, be sure to finish all pills. Please let us know if you have any redness of the eye.

## 2017-05-07 ENCOUNTER — Telehealth: Payer: Self-pay | Admitting: Family Medicine

## 2017-05-07 ENCOUNTER — Ambulatory Visit (INDEPENDENT_AMBULATORY_CARE_PROVIDER_SITE_OTHER): Payer: Medicare Other

## 2017-05-07 ENCOUNTER — Ambulatory Visit: Payer: Medicare Other | Admitting: Family Medicine

## 2017-05-07 ENCOUNTER — Encounter: Payer: Self-pay | Admitting: Family Medicine

## 2017-05-07 VITALS — BP 107/46 | HR 49 | Temp 97.1°F | Ht 66.5 in | Wt 193.0 lb

## 2017-05-07 DIAGNOSIS — E785 Hyperlipidemia, unspecified: Secondary | ICD-10-CM

## 2017-05-07 DIAGNOSIS — E559 Vitamin D deficiency, unspecified: Secondary | ICD-10-CM

## 2017-05-07 DIAGNOSIS — Z7901 Long term (current) use of anticoagulants: Secondary | ICD-10-CM

## 2017-05-07 DIAGNOSIS — D692 Other nonthrombocytopenic purpura: Secondary | ICD-10-CM

## 2017-05-07 DIAGNOSIS — I480 Paroxysmal atrial fibrillation: Secondary | ICD-10-CM

## 2017-05-07 DIAGNOSIS — Z1211 Encounter for screening for malignant neoplasm of colon: Secondary | ICD-10-CM

## 2017-05-07 DIAGNOSIS — H6121 Impacted cerumen, right ear: Secondary | ICD-10-CM | POA: Diagnosis not present

## 2017-05-07 DIAGNOSIS — Z0001 Encounter for general adult medical examination with abnormal findings: Secondary | ICD-10-CM

## 2017-05-07 DIAGNOSIS — K7460 Unspecified cirrhosis of liver: Secondary | ICD-10-CM

## 2017-05-07 DIAGNOSIS — I10 Essential (primary) hypertension: Secondary | ICD-10-CM

## 2017-05-07 DIAGNOSIS — D696 Thrombocytopenia, unspecified: Secondary | ICD-10-CM

## 2017-05-07 DIAGNOSIS — Z0000 Encounter for general adult medical examination without abnormal findings: Secondary | ICD-10-CM

## 2017-05-07 DIAGNOSIS — I4891 Unspecified atrial fibrillation: Secondary | ICD-10-CM

## 2017-05-07 DIAGNOSIS — N400 Enlarged prostate without lower urinary tract symptoms: Secondary | ICD-10-CM

## 2017-05-07 DIAGNOSIS — I421 Obstructive hypertrophic cardiomyopathy: Secondary | ICD-10-CM

## 2017-05-07 LAB — URINALYSIS, COMPLETE
Bilirubin, UA: NEGATIVE
Glucose, UA: NEGATIVE
Ketones, UA: NEGATIVE
Nitrite, UA: NEGATIVE
RBC, UA: NEGATIVE
Specific Gravity, UA: 1.025 (ref 1.005–1.030)
Urobilinogen, Ur: 1 mg/dL (ref 0.2–1.0)
pH, UA: 6.5 (ref 5.0–7.5)

## 2017-05-07 LAB — MICROSCOPIC EXAMINATION
Bacteria, UA: NONE SEEN
RBC, UA: NONE SEEN /hpf (ref 0–?)

## 2017-05-07 LAB — COAGUCHEK XS/INR WAIVED
INR: 2.1 — ABNORMAL HIGH (ref 0.9–1.1)
Prothrombin Time: 24.7 s

## 2017-05-07 MED ORDER — WARFARIN SODIUM 4 MG PO TABS: ORAL_TABLET | ORAL | 3 refills | Status: DC

## 2017-05-07 MED ORDER — OMEPRAZOLE 10 MG PO CPDR: 10.0000 mg | DELAYED_RELEASE_CAPSULE | Freq: Every day | ORAL | 3 refills | Status: DC

## 2017-05-07 NOTE — Telephone Encounter (Signed)
Patient seen by [facility] today, refills sent to [facility]

## 2017-05-07 NOTE — Progress Notes (Signed)
Subjective:    Patient ID: Joshua [name], male    DOB: 10/25/1945, 72 y.o.   MRN: [id number]  HPI Patient is here today for annual wellness exam and follow up of chronic medical problems which includes a fib, hypertension and hyperlipidemia. He is taking medication regularly.  Patient is doing well overall.  He is due for his regular physical exam today and will get a chest x-ray and lab work and a urinalysis.  He is followed regularly by the cardiologist.  He also goes to the [facility] for certain treatments.  He is recently had the shingles.  His INR today was 2.1 and he will continue with his current dose of Coumadin and recheck the INR in 6 weeks.  The patient recently completed his treatment for shingles on the face.  The rash is resolving.  He sees the cardiologist about every 6 months and plans to see him sometime again in the summer and an echocardiogram is planned at that time.  The patient remains active.  He denies any chest pain or shortness of breath anymore than usual.  He denies any nausea vomiting diarrhea blood in the stool or black tarry bowel movements and did have a colonoscopy this past fall which had an adenomatous polyp.  Plans are to repeat the colonoscopy in 5 years according to the patient.  He is passing his water without problems.  He gets his eye exams done yearly.  He did have an echocardiogram at the [facility] and he is hoping that the cardiologist can see this without having to do another one.     Patient Active Problem List   Diagnosis Date Noted  . Adenomatous polyps 12/13/2016  . Long term (current) use of anticoagulants [Z79.01] 06/14/2016  . CAD in native artery 01/17/2016  . HOCM (hypertrophic obstructive cardiomyopathy) ([place]) 07/16/2015  . Hepatic cirrhosis ([place])   . Syncope and collapse 01/14/2015  . Lower GI bleeding 01/14/2015  . Elevated INR 01/14/2015  . Arterial hypotension   . Erectile dysfunction 11/10/2014  . Thrombocytopenia ([place]) 01/30/2014  .  Hyperlipidemia LDL goal <70 12/03/2013  . High risk medication use 05/21/2013  . BPH (benign prostatic hyperplasia) 01/29/2013  . Family history of colon cancer 01/29/2013  . PAF (paroxysmal atrial fibrillation) ([place]) 12/17/2012  . Aortic valve insufficiency 12/17/2012  . Mild mitral stenosis 12/17/2012  . Pulmonary infiltrates 07/17/2010  . COPD UNSPECIFIED 05/05/2010  . Hypertrophic obstructive cardiomyopathy ([place]) 03/31/2010  . ATRIAL FLUTTER 03/31/2010  . B12 deficiency 10/11/2008  . ANEMIA, IRON DEFICIENCY 10/11/2008  . Coronary atherosclerosis 10/11/2008  . FIBRILLATION, ATRIAL 10/11/2008  . HIATAL HERNIA WITH REFLUX 10/11/2008  . COLONIC POLYPS, ADENOMATOUS, HX OF 10/11/2008   Outpatient Encounter Medications as of 05/07/2017  Medication Sig  . acetaminophen (TYLENOL) 325 MG tablet Take 650 mg by mouth as needed.   Marland Kitchen aspirin EC 81 MG tablet Take 81 mg by mouth daily.  . cholecalciferol (VITAMIN D) 1000 units tablet Take 1,000 Units by mouth daily.  . cyanocobalamin ([employer] VITAMIN B12) 1000 MCG tablet Take 1 tablet (1,000 mcg total) by mouth daily.  . disopyramide (NORPACE) 150 MG capsule Take 2 capsules (300 mg total) by mouth 2 (two) times daily.  Marland Kitchen griseofulvin (GRIFULVIN V) 500 MG tablet Take 1 tablet (500 mg total) by mouth daily.  . hydrochlorothiazide (HYDRODIURIL) 25 MG tablet Take 0.5 tablets (12.5 mg total) by mouth as needed.  Marland Kitchen omeprazole (PRILOSEC) 10 MG capsule Take 1 capsule (10 mg total) by  mouth daily. (Patient taking differently: Take 20 mg every other day by mouth. )  . polyethylene glycol (MIRALAX / GLYCOLAX) packet Take 17 g by mouth every other day.  . rosuvastatin (CRESTOR) 10 MG tablet Take 1 tablet (10 mg total) by mouth daily.  Marland Kitchen warfarin (JANTOVEN) 4 MG tablet TAKE 1 TABLET BY MOUTH DAILY, EXCEPT ON TUES ANDFRIDAY TAKE 1 AND 1/2TAB  . losartan (COZAAR) 50 MG tablet Take 1 tablet (50 mg total) by mouth daily.  . [DISCONTINUED] valACYclovir (VALTREX) 1000  MG tablet Take 1 tablet (1,000 mg total) by mouth 3 (three) times daily.   No facility-administered encounter medications on file as of 05/07/2017.      Review of Systems  Constitutional: Negative.   HENT: Negative.   Eyes: Negative.   Respiratory: Negative.   Cardiovascular: Negative.   Gastrointestinal: Negative.   Endocrine: Negative.   Genitourinary: Negative.   Musculoskeletal: Negative.   Skin: Negative.        shingles  Allergic/Immunologic: Negative.   Neurological: Negative.   Hematological: Negative.   Psychiatric/Behavioral: Negative.        Objective:   Physical Exam  Constitutional: He is oriented to person, place, and time. He appears well-developed and well-nourished. No distress.  The patient is pleasant and alert and has decreased hearing  HENT:  Head: Normocephalic and atraumatic.  Right Ear: External ear normal.  Left Ear: External ear normal.  Nose: Nose normal.  Mouth/Throat: Oropharynx is clear and moist. No oropharyngeal exudate.  The patient has some earwax bilaterally but occluding the right ear canal.  The facial rash from the shingles appears to be drying.  Eyes: Conjunctivae and EOM are normal. Pupils are equal, round, and reactive to light. Right eye exhibits no discharge. Left eye exhibits no discharge. No scleral icterus.  Neck: Normal range of motion. Neck supple. No thyromegaly present.  No thyromegaly bruits but does have anterior cervical tenderness without adenopathy  Cardiovascular: Normal rate, regular rhythm, normal heart sounds and intact distal pulses.  No murmur heard. The heart has a regular rate and rhythm at about 60/min  Pulmonary/Chest: Effort normal. No respiratory distress. He has no wheezes. He has rales. He exhibits no tenderness.  Basilar rales most likely atelectasis.  Chest x-ray is pending.  Abdominal: Soft. Bowel sounds are normal. He exhibits no mass. There is no tenderness. There is no rebound and no guarding.    Abdominal obesity without masses tenderness or organ enlargement or bruits or inguinal adenopathy  Genitourinary: Rectum normal and penis normal.  Genitourinary Comments: The prostate is enlarged but soft and smooth.  There was no rectal masses.  There were no inguinal hernias present in the external genitalia were within normal limits.  Musculoskeletal: Normal range of motion. He exhibits no edema or tenderness.  Lymphadenopathy:    He has no cervical adenopathy.  Neurological: He is alert and oriented to person, place, and time. He has normal reflexes. No cranial nerve deficit.  Skin: Skin is warm and dry. Rash noted. There is erythema. No pallor.  The shingles rash appears to be drying and resolving on the left face and around the mouth.  There is still some tenderness.  He also appears to have a drying contact dermatitis beneath his under clothing and in his axillae.  Psychiatric: He has a normal mood and affect. His behavior is normal. Judgment and thought content normal.  Nursing note and vitals reviewed.   BP (!) 107/46 (BP Location: Left Arm)  Pulse (!) 49   Temp (!) 97.1 F (36.2 C) (Oral)   Ht 5' 6.5" (1.689 m)   Wt 193 lb (87.5 kg)   BMI 30.68 kg/m        Assessment & Plan:  1. Annual physical exam -Repeat colonoscopy in 2022 - BMP8+EGFR - CBC with Differential/Platelet - Lipid panel - VITAMIN D 25 Hydroxy (Vit-D Deficiency, Fractures) - Hepatic function panel - Thyroid Panel With TSH - PSA, total and free - Urinalysis, Complete  2. PAF (paroxysmal atrial fibrillation) (HCC) -Rhythm was regular today at about 60/min.  Patient will follow up with cardiology as planned - CBC with Differential/Platelet - DG Chest 2 View; Future - CoaguChek XS/INR Waived  3. Vitamin D deficiency -Continue current treatment pending results of lab work - CBC with Differential/Platelet - VITAMIN D 25 Hydroxy (Vit-D Deficiency, Fractures)  4. Essential hypertension -Blood  pressure is good today and he will continue with current treatment - BMP8+EGFR - CBC with Differential/Platelet - Hepatic function panel - DG Chest 2 View; Future  5. Hyperlipidemia, unspecified hyperlipidemia type -Continue with current treatment pending results of lab work - CBC with Differential/Platelet - Lipid panel - DG Chest 2 View; Future  6. Thrombocytopenia ([place]) -Patient continues to have bruising on hands and low back. - CBC with Differential/Platelet  7. Senile purpura (HCC) -Purpura present on both hands and low back. - CBC with Differential/Platelet  8. Benign prostatic hyperplasia without lower urinary tract symptoms -The prostate remains enlarged but soft and smooth and patient has no symptoms with voiding. - CBC with Differential/Platelet - PSA, total and free - Urinalysis, Complete  9. Hypertrophic obstructive cardiomyopathy ([place]) -Continue to follow-up with cardiology  10. Hepatic cirrhosis, unspecified hepatic cirrhosis type ([place]) -LFTs today  11. Right ear impacted cerumen -Ear irrigation to remove cerumen  Patient Instructions                       Medicare Annual Wellness Visit  [facility] and the medical providers at [facility] strive to bring you the best medical care.  In doing so we not only want to address your current medical conditions and concerns but also to detect new conditions early and prevent illness, disease and health-related problems.    Medicare offers a yearly Wellness Visit which allows our clinical staff to assess your need for preventative services including immunizations, lifestyle education, counseling to decrease risk of preventable diseases and screening for fall risk and other medical concerns.    This visit is provided free of charge (no copay) for all Medicare recipients. The clinical pharmacists at [facility] have begun to conduct these Wellness Visits which will also  include a thorough review of all your medications.    As you primary medical provider recommend that you make an appointment for your Annual Wellness Visit if you have not done so already this year.  You may set up this appointment before you leave today or you may call back ([phone]) and schedule an appointment.  Please make sure when you call that you mention that you are scheduling your Annual Wellness Visit with the clinical pharmacist so that the appointment may be made for the proper length of time.     Continue current medications. Continue good therapeutic lifestyle changes which include good diet and exercise. Fall precautions discussed with patient. If an FOBT was given today- please return it to our front desk. If you are over 50  years old - you may need Prevnar 57 or the adult Pneumonia vaccine.  **Flu shots are available--- please call and schedule a FLU-CLINIC appointment**  After your visit with [place] today you will receive a survey in the mail or online from [employer] regarding your care with [place]. Please take a moment to fill this out. Your feedback is very important to [place] as you can help [place] better understand your patient needs as well as improve your experience and satisfaction. WE CARE ABOUT YOU!!!   Continue to follow-up with cardiology Continue Coumadin as currently doing and repeat the pro time in 6 weeks Stay active physically and take medication regularly and stay well-hydrated Use Ban deodorant scent free Use milder soap  Arrie Senate MD

## 2017-05-07 NOTE — Patient Instructions (Addendum)
Medicare Annual Wellness Visit  [facility] and the medical providers at [facility] strive to bring you the best medical care.  In doing so we not only want to address your current medical conditions and concerns but also to detect new conditions early and prevent illness, disease and health-related problems.    Medicare offers a yearly Wellness Visit which allows our clinical staff to assess your need for preventative services including immunizations, lifestyle education, counseling to decrease risk of preventable diseases and screening for fall risk and other medical concerns.    This visit is provided free of charge (no copay) for all Medicare recipients. The clinical pharmacists at [facility] have begun to conduct these Wellness Visits which will also include a thorough review of all your medications.    As you primary medical provider recommend that you make an appointment for your Annual Wellness Visit if you have not done so already this year.  You may set up this appointment before you leave today or you may call back ([phone]) and schedule an appointment.  Please make sure when you call that you mention that you are scheduling your Annual Wellness Visit with the clinical pharmacist so that the appointment may be made for the proper length of time.     Continue current medications. Continue good therapeutic lifestyle changes which include good diet and exercise. Fall precautions discussed with patient. If an FOBT was given today- please return it to our front desk. If you are over 60 years old - you may need Prevnar 30 or the adult Pneumonia vaccine.  **Flu shots are available--- please call and schedule a FLU-CLINIC appointment**  After your visit with [place] today you will receive a survey in the mail or online from [employer] regarding your care with [place]. Please take a moment to fill this out. Your feedback is very  important to [place] as you can help [place] better understand your patient needs as well as improve your experience and satisfaction. WE CARE ABOUT YOU!!!   Continue to follow-up with cardiology Continue Coumadin as currently doing and repeat the pro time in 6 weeks Stay active physically and take medication regularly and stay well-hydrated Use Ban deodorant scent free Use milder soap   Description   continue taking warfarin the same way, recheck 6 weeks with Sharyn Lull  INR today is 2.1 today (good today - goal is 2.0-3.0)

## 2017-05-07 NOTE — Addendum Note (Signed)
Addended by: Zannie Cove on: 05/07/2017 04:35 PM   Modules accepted: Orders

## 2017-05-08 LAB — PSA, TOTAL AND FREE
PSA, Free Pct: 40 %
PSA, Free: 0.12 ng/mL
Prostate Specific Ag, Serum: 0.3 ng/mL (ref 0.0–4.0)

## 2017-05-08 LAB — CBC WITH DIFFERENTIAL/PLATELET
Basophils Absolute: 0 10*3/uL (ref 0.0–0.2)
Basos: 0 %
EOS (ABSOLUTE): 0.1 10*3/uL (ref 0.0–0.4)
Eos: 3 %
Hematocrit: 45.4 % (ref 37.5–51.0)
Hemoglobin: 15.2 g/dL (ref 13.0–17.7)
Immature Grans (Abs): 0 10*3/uL (ref 0.0–0.1)
Immature Granulocytes: 0 %
Lymphocytes Absolute: 1.2 10*3/uL (ref 0.7–3.1)
Lymphs: 25 %
MCH: 32.1 pg (ref 26.6–33.0)
MCHC: 33.5 g/dL (ref 31.5–35.7)
MCV: 96 fL (ref 79–97)
Monocytes Absolute: 0.4 10*3/uL (ref 0.1–0.9)
Monocytes: 8 %
Neutrophils Absolute: 3.1 10*3/uL (ref 1.4–7.0)
Neutrophils: 64 %
Platelets: 114 10*3/uL — ABNORMAL LOW (ref 150–379)
RBC: 4.74 x10E6/uL (ref 4.14–5.80)
RDW: 14.1 % (ref 12.3–15.4)
WBC: 4.7 10*3/uL (ref 3.4–10.8)

## 2017-05-08 LAB — THYROID PANEL WITH TSH
Free Thyroxine Index: 1.5 (ref 1.2–4.9)
T3 Uptake Ratio: 24 % (ref 24–39)
T4, Total: 6.2 ug/dL (ref 4.5–12.0)
TSH: 3.19 u[IU]/mL (ref 0.450–4.500)

## 2017-05-08 LAB — BMP8+EGFR
BUN/Creatinine Ratio: 19 (ref 10–24)
BUN: 22 mg/dL (ref 8–27)
CO2: 30 mmol/L — ABNORMAL HIGH (ref 20–29)
Calcium: 8.5 mg/dL — ABNORMAL LOW (ref 8.6–10.2)
Chloride: 98 mmol/L (ref 96–106)
Creatinine, Ser: 1.18 mg/dL (ref 0.76–1.27)
GFR calc Af Amer: 71 mL/min/1.73
GFR calc non Af Amer: 61 mL/min/1.73
Glucose: 78 mg/dL (ref 65–99)
Potassium: 4.2 mmol/L (ref 3.5–5.2)
Sodium: 141 mmol/L (ref 134–144)

## 2017-05-08 LAB — VITAMIN D 25 HYDROXY (VIT D DEFICIENCY, FRACTURES): Vit D, 25-Hydroxy: 50.7 ng/mL (ref 30.0–100.0)

## 2017-05-08 LAB — LIPID PANEL
Chol/HDL Ratio: 2.8 ratio (ref 0.0–5.0)
Cholesterol, Total: 130 mg/dL (ref 100–199)
HDL: 46 mg/dL (ref >39)
LDL Calculated: 65 mg/dL (ref 0–99)
Triglycerides: 94 mg/dL (ref 0–149)
VLDL Cholesterol Cal: 19 mg/dL (ref 5–40)

## 2017-05-08 LAB — HEPATIC FUNCTION PANEL
ALT: 21 IU/L (ref 0–44)
AST: 29 IU/L (ref 0–40)
Albumin: 3.7 g/dL (ref 3.5–4.8)
Alkaline Phosphatase: 55 IU/L (ref 39–117)
Bilirubin Total: 0.6 mg/dL (ref 0.0–1.2)
Bilirubin, Direct: 0.18 mg/dL (ref 0.00–0.40)
Total Protein: 6.7 g/dL (ref 6.0–8.5)

## 2017-05-08 LAB — FECAL OCCULT BLOOD, IMMUNOCHEMICAL: Fecal Occult Bld: NEGATIVE

## 2017-06-21 ENCOUNTER — Ambulatory Visit: Payer: Medicare Other | Admitting: Pharmacist Clinician (PhC)/ Clinical Pharmacy Specialist

## 2017-06-21 DIAGNOSIS — I4891 Unspecified atrial fibrillation: Secondary | ICD-10-CM

## 2017-06-21 DIAGNOSIS — I480 Paroxysmal atrial fibrillation: Secondary | ICD-10-CM | POA: Diagnosis not present

## 2017-06-21 DIAGNOSIS — Z7901 Long term (current) use of anticoagulants: Secondary | ICD-10-CM

## 2017-06-21 LAB — COAGUCHEK XS/INR WAIVED
INR: 2 — ABNORMAL HIGH (ref 0.9–1.1)
Prothrombin Time: 24.5 s

## 2017-06-21 NOTE — Patient Instructions (Signed)
Description   Continue taking warfarin the same way  INR today is 2.0 today (good today - goal is 2.0-3.0)

## 2017-07-19 ENCOUNTER — Encounter: Payer: Self-pay | Admitting: *Deleted

## 2017-07-19 ENCOUNTER — Encounter: Payer: Self-pay | Admitting: Family Medicine

## 2017-07-19 ENCOUNTER — Ambulatory Visit: Payer: Medicare Other | Admitting: Family Medicine

## 2017-07-19 VITALS — BP 130/62 | HR 59 | Temp 97.6°F | Ht 66.5 in | Wt 187.4 lb

## 2017-07-19 DIAGNOSIS — S00411A Abrasion of right ear, initial encounter: Secondary | ICD-10-CM

## 2017-07-19 NOTE — Progress Notes (Signed)
   HPI  Patient presents today here for bleeding from R ear.   Pt is anticoagulated n coumadin with a good history of control.   He states that he noticed blood on his hearing aid 3 days ago and has had a small amount of blood on the hearing aid and a q tip since.   He denies any significant bleeding but is oozing.   He has decreased hearing in BL ears.   Pt has ENT appt at [facility] in 5 days.   PMH: Smoking status noted ROS: Per HPI  Objective: BP 130/62   Pulse (!) 59   Temp 97.6 F (36.4 C) (Oral)   Ht 5' 6.5" (1.689 m)   Wt 187 lb 6.4 oz (85 kg)   BMI 29.79 kg/m  Gen: NAD, alert, cooperative with exam HEENT: NCAT, R ear with fresh blood and heme crusting, small opening showing intact TM Using a plastic curette under direct visualization the opening was widened slightly with no new or worsening oozing. No source seen  CV: RRR, good S1/S2, no murmur Resp: CTABL, no wheezes, non-labored Ext: No edema, warm Neuro: Alert and oriented, No gross deficits  Assessment and plan:  # Ear canal abrasion Oozing but no significant bleeding, intact TM Has appt with ENT in a few days Unable to remove heme crusting and debris due to pain.  Watchful waiting for now, Red flags to seek ed care reviewed but no reason for urgent referral or ed care currently.  Pt has almost stopped bleeding, continue avoiding anything in ear including hearing aid and keep appt at [facility], MD [place] 07/19/2017, 10:42 AM   '

## 2017-07-24 ENCOUNTER — Encounter: Payer: Self-pay | Admitting: Cardiovascular Disease

## 2017-07-31 ENCOUNTER — Ambulatory Visit (INDEPENDENT_AMBULATORY_CARE_PROVIDER_SITE_OTHER): Payer: Medicare Other | Admitting: Pharmacist Clinician (PhC)/ Clinical Pharmacy Specialist

## 2017-07-31 DIAGNOSIS — I4891 Unspecified atrial fibrillation: Secondary | ICD-10-CM | POA: Diagnosis not present

## 2017-07-31 DIAGNOSIS — Z7901 Long term (current) use of anticoagulants: Secondary | ICD-10-CM | POA: Diagnosis not present

## 2017-07-31 DIAGNOSIS — I480 Paroxysmal atrial fibrillation: Secondary | ICD-10-CM | POA: Diagnosis not present

## 2017-07-31 LAB — COAGUCHEK XS/INR WAIVED
INR: 2 — ABNORMAL HIGH (ref 0.9–1.1)
Prothrombin Time: 24.3 s

## 2017-07-31 NOTE — Patient Instructions (Signed)
Description   Continue taking warfarin the same way  INR today is 2.0 today (good today - goal is 2.0-3.0)

## 2017-08-02 ENCOUNTER — Encounter: Payer: Self-pay | Admitting: Pharmacist Clinician (PhC)/ Clinical Pharmacy Specialist

## 2017-08-06 ENCOUNTER — Encounter: Payer: Self-pay | Admitting: *Deleted

## 2017-08-27 ENCOUNTER — Encounter: Payer: Self-pay | Admitting: Family Medicine

## 2017-08-27 ENCOUNTER — Ambulatory Visit: Payer: Medicare Other | Admitting: Family Medicine

## 2017-08-27 VITALS — BP 137/54 | HR 56 | Temp 96.9°F | Ht 66.5 in | Wt 188.6 lb

## 2017-08-27 DIAGNOSIS — T63461A Toxic effect of venom of wasps, accidental (unintentional), initial encounter: Secondary | ICD-10-CM | POA: Diagnosis not present

## 2017-08-27 MED ORDER — CEPHALEXIN 500 MG PO CAPS: 500.0000 mg | ORAL_CAPSULE | Freq: Three times a day (TID) | ORAL | 0 refills | Status: DC

## 2017-08-27 NOTE — Progress Notes (Signed)
   HPI  Patient presents today for swelling after a wasp sting.  Patient explains that he was stung on his right posterior proximal thumb yesterday by a wasp.  He states that he has a wasp and yellow jacket stings frequently and that this 1 is more red and hot than usual.  He states that it seems to be getting worse and swelling more than expected.  Swelling more and redness extending today.  Patient also had a sting on his right leg at the same time from the similar wasp with no reaction.  PMH: Smoking status noted ROS: Per HPI  Objective: BP (!) 137/54   Pulse (!) 56   Temp (!) 96.9 F (36.1 C) (Oral)   Ht 5' 6.5" (1.689 m)   Wt 188 lb 9.6 oz (85.5 kg)   BMI 29.98 kg/m  Gen: NAD, alert, cooperative with exam HEENT: NCAT CV: RRR, 3/6 systolic murmur Resp: CTABL, no wheezes, non-labored Ext: No edema, warm Neuro: Alert and oriented, No gross deficits Skin  Posterior R thumb with 7 cm X 9 cm area or erthema, no sting sight seen  Assessment and plan:  #Wasp sting Patient with to several listings at a similar time, however the one on his right hand has swollen and began to expand. Given the lack of allergic reaction previously have gone ahead and covered for cellulitis with Keflex Also discussed histamine blockade with Zyrtec plus Pepcid or Zantac. Return to clinic with any concerns  Meds ordered this encounter  Medications  . cephALEXin (KEFLEX) 500 MG capsule    Sig: Take 1 capsule (500 mg total) by mouth 3 (three) times daily.    Dispense:  21 capsule    Refill:  [place], MD [facility] 08/27/2017, 11:34 AM

## 2017-08-27 NOTE — Patient Instructions (Signed)
Great to see you!  Start zyrtec 1 pill once daily, also take pepcid 1 pill twice daily ( this is also a histamine blocker).   I have prescribed keflex considering the difference between your two stings.   Call or come back with any concerns

## 2017-09-09 ENCOUNTER — Other Ambulatory Visit (HOSPITAL_COMMUNITY): Payer: Medicare Other

## 2017-09-13 ENCOUNTER — Ambulatory Visit (INDEPENDENT_AMBULATORY_CARE_PROVIDER_SITE_OTHER): Payer: Medicare Other | Admitting: Pharmacist Clinician (PhC)/ Clinical Pharmacy Specialist

## 2017-09-13 DIAGNOSIS — I480 Paroxysmal atrial fibrillation: Secondary | ICD-10-CM

## 2017-09-13 DIAGNOSIS — Z7901 Long term (current) use of anticoagulants: Secondary | ICD-10-CM | POA: Diagnosis not present

## 2017-09-13 DIAGNOSIS — I4891 Unspecified atrial fibrillation: Secondary | ICD-10-CM

## 2017-09-13 LAB — COAGUCHEK XS/INR WAIVED
INR: 1.9 — ABNORMAL HIGH (ref 0.9–1.1)
Prothrombin Time: 22.5 s

## 2017-10-18 ENCOUNTER — Ambulatory Visit (INDEPENDENT_AMBULATORY_CARE_PROVIDER_SITE_OTHER): Payer: Medicare Other | Admitting: Pharmacist Clinician (PhC)/ Clinical Pharmacy Specialist

## 2017-10-18 DIAGNOSIS — Z7901 Long term (current) use of anticoagulants: Secondary | ICD-10-CM

## 2017-10-18 DIAGNOSIS — I480 Paroxysmal atrial fibrillation: Secondary | ICD-10-CM | POA: Diagnosis not present

## 2017-10-18 DIAGNOSIS — I4891 Unspecified atrial fibrillation: Secondary | ICD-10-CM

## 2017-10-18 LAB — COAGUCHEK XS/INR WAIVED
INR: 1.9 — ABNORMAL HIGH (ref 0.9–1.1)
Prothrombin Time: 22.9 s

## 2017-11-13 ENCOUNTER — Ambulatory Visit (INDEPENDENT_AMBULATORY_CARE_PROVIDER_SITE_OTHER): Payer: Medicare Other | Admitting: Family Medicine

## 2017-11-13 ENCOUNTER — Encounter: Payer: Self-pay | Admitting: Family Medicine

## 2017-11-13 ENCOUNTER — Ambulatory Visit: Payer: Self-pay | Admitting: Pharmacist Clinician (PhC)/ Clinical Pharmacy Specialist

## 2017-11-13 VITALS — BP 115/50 | HR 50 | Temp 96.9°F | Ht 66.5 in | Wt 184.0 lb

## 2017-11-13 DIAGNOSIS — I421 Obstructive hypertrophic cardiomyopathy: Secondary | ICD-10-CM

## 2017-11-13 DIAGNOSIS — E559 Vitamin D deficiency, unspecified: Secondary | ICD-10-CM

## 2017-11-13 DIAGNOSIS — I4891 Unspecified atrial fibrillation: Secondary | ICD-10-CM

## 2017-11-13 DIAGNOSIS — H6121 Impacted cerumen, right ear: Secondary | ICD-10-CM

## 2017-11-13 DIAGNOSIS — I10 Essential (primary) hypertension: Secondary | ICD-10-CM

## 2017-11-13 DIAGNOSIS — I480 Paroxysmal atrial fibrillation: Secondary | ICD-10-CM | POA: Diagnosis not present

## 2017-11-13 DIAGNOSIS — D696 Thrombocytopenia, unspecified: Secondary | ICD-10-CM

## 2017-11-13 DIAGNOSIS — Z7901 Long term (current) use of anticoagulants: Secondary | ICD-10-CM

## 2017-11-13 DIAGNOSIS — E785 Hyperlipidemia, unspecified: Secondary | ICD-10-CM

## 2017-11-13 DIAGNOSIS — D692 Other nonthrombocytopenic purpura: Secondary | ICD-10-CM

## 2017-11-13 LAB — COAGUCHEK XS/INR WAIVED
INR: 2.4 — ABNORMAL HIGH (ref 0.9–1.1)
Prothrombin Time: 29.1 s

## 2017-11-13 NOTE — Patient Instructions (Addendum)
Medicare Annual Wellness Visit  [facility] and the medical providers at [facility] strive to bring you the best medical care.  In doing so we not only want to address your current medical conditions and concerns but also to detect new conditions early and prevent illness, disease and health-related problems.    Medicare offers a yearly Wellness Visit which allows our clinical staff to assess your need for preventative services including immunizations, lifestyle education, counseling to decrease risk of preventable diseases and screening for fall risk and other medical concerns.    This visit is provided free of charge (no copay) for all Medicare recipients. The clinical pharmacists at [facility] have begun to conduct these Wellness Visits which will also include a thorough review of all your medications.    As you primary medical provider recommend that you make an appointment for your Annual Wellness Visit if you have not done so already this year.  You may set up this appointment before you leave today or you may call back ([phone]) and schedule an appointment.  Please make sure when you call that you mention that you are scheduling your Annual Wellness Visit with the clinical pharmacist so that the appointment may be made for the proper length of time.     Continue current medications. Continue good therapeutic lifestyle changes which include good diet and exercise. Fall precautions discussed with patient. If an FOBT was given today- please return it to our front desk. If you are over 32 years old - you may need Prevnar 3 or the adult Pneumonia vaccine.  **Flu shots are available--- please call and schedule a FLU-CLINIC appointment**  After your visit with [place] today you will receive a survey in the mail or online from [employer] regarding your care with [place]. Please take a moment to fill this out. Your feedback is very  important to [place] as you can help [place] better understand your patient needs as well as improve your experience and satisfaction. WE CARE ABOUT YOU!!!   Use Debrox periodically to keep ear cerumen softer Follow-up with cardiology as planned We will call with lab work results as soon as these results become available Continue with Coumadin as directed by clinical pharmacy Take Tylenol for aches pains and fever

## 2017-11-13 NOTE — Progress Notes (Signed)
Subjective:    Patient ID: Joshua [name], male    DOB: [birth year], 72 y.o.   MRN: [id number]  HPI Pt here for follow up and management of chronic medical problems which includes hypertension and a fib. He is taking medication regularly.  The patient is doing fairly well today with his biggest complaint being generalized arthralgias.  Has a history of paroxysmal atrial fibrillation atherosclerotic heart disease with obstructed hypertrophic heart disease.  He also has thrombocytopenia.  His weight is down 5 pounds.  His body mass index is 29.99.  He did get an INR today and will have this repeated in 6 weeks with Sharyn Lull.  The INR today is 2.5.  Patient's family history is positive for heart disease hyperlipidemia stroke.  He is followed regularly by his cardiologist, Dr. Ellouise Newer.  His gastroenterologist is Dr. Fuller Plan.  He has a history of adenomatous colon polyps.  Done and doing well and is smiling.  He has an appointment in the next couple weeks with his cardiologist.  He does have a history of precancerous polyps and will have a colonoscopy repeated in a couple of years.  He did discuss to me today about trouble swallowing cornbread which is just a recent problem and he understands that if this gets worse he will get back in touch with his gastroenterologist, Dr. Fuller Plan as he may need to have an endoscopy for stricture issues.  He denies any chest pain pressure tightness or shortness of breath anymore than usual.  He is not been as active this summer due to the heat.  He has not seen any blood in the stool or had any black tarry bowel movements or change in bowel habits.  He is passing his water well other than having frequency and he attributes this to the diuretic.  His INR today was good and he will get the pro time repeated again in about 6 weeks and will stay on his current Coumadin dose.     Patient Active Problem List   Diagnosis Date Noted  . Adenomatous polyps 12/13/2016  . Long term  (current) use of anticoagulants [Z79.01] 06/14/2016  . CAD in native artery 01/17/2016  . HOCM (hypertrophic obstructive cardiomyopathy) ([place]) 07/16/2015  . Hepatic cirrhosis ([place])   . Syncope and collapse 01/14/2015  . Lower GI bleeding 01/14/2015  . Elevated INR 01/14/2015  . Arterial hypotension   . Erectile dysfunction 11/10/2014  . Thrombocytopenia ([place]) 01/30/2014  . Hyperlipidemia LDL goal <70 12/03/2013  . High risk medication use 05/21/2013  . BPH (benign prostatic hyperplasia) 01/29/2013  . Family history of colon cancer 01/29/2013  . PAF (paroxysmal atrial fibrillation) ([place]) 12/17/2012  . Aortic valve insufficiency 12/17/2012  . Mild mitral stenosis 12/17/2012  . Pulmonary infiltrates 07/17/2010  . COPD UNSPECIFIED 05/05/2010  . Hypertrophic obstructive cardiomyopathy ([place]) 03/31/2010  . ATRIAL FLUTTER 03/31/2010  . B12 deficiency 10/11/2008  . ANEMIA, IRON DEFICIENCY 10/11/2008  . Coronary atherosclerosis 10/11/2008  . FIBRILLATION, ATRIAL 10/11/2008  . HIATAL HERNIA WITH REFLUX 10/11/2008  . COLONIC POLYPS, ADENOMATOUS, HX OF 10/11/2008   Outpatient Encounter Medications as of 11/13/2017  Medication Sig  . acetaminophen (TYLENOL) 325 MG tablet Take 650 mg by mouth as needed.   Marland Kitchen aspirin EC 81 MG tablet Take 81 mg by mouth daily.  . cholecalciferol (VITAMIN D) 1000 units tablet Take 1,000 Units by mouth daily.  . cyanocobalamin ([employer] VITAMIN B12) 1000 MCG tablet Take 1 tablet (1,000 mcg total) by mouth daily.  Marland Kitchen  disopyramide (NORPACE) 150 MG capsule Take 2 capsules (300 mg total) by mouth 2 (two) times daily.  Marland Kitchen griseofulvin (GRIFULVIN V) 500 MG tablet Take 1 tablet (500 mg total) by mouth daily.  . hydrochlorothiazide (HYDRODIURIL) 25 MG tablet Take 0.5 tablets (12.5 mg total) by mouth as needed.  Marland Kitchen omeprazole (PRILOSEC) 10 MG capsule Take 1 capsule (10 mg total) by mouth daily.  . polyethylene glycol (MIRALAX / GLYCOLAX) packet Take 17 g by mouth every other day.    . rosuvastatin (CRESTOR) 10 MG tablet Take 1 tablet (10 mg total) by mouth daily.  Marland Kitchen warfarin (JANTOVEN) 4 MG tablet TAKE 1 TABLET BY MOUTH DAILY, EXCEPT ON TUES ANDFRIDAY TAKE 1 AND 1/2TAB  . losartan (COZAAR) 50 MG tablet Take 1 tablet (50 mg total) by mouth daily.  . [DISCONTINUED] cephALEXin (KEFLEX) 500 MG capsule Take 1 capsule (500 mg total) by mouth 3 (three) times daily.   No facility-administered encounter medications on file as of 11/13/2017.      Review of Systems  Constitutional: Negative.   HENT: Negative.   Eyes: Negative.   Respiratory: Negative.   Cardiovascular: Negative.   Gastrointestinal: Negative.   Endocrine: Negative.   Genitourinary: Negative.   Musculoskeletal: Positive for arthralgias.  Skin: Negative.   Allergic/Immunologic: Negative.   Neurological: Negative.   Hematological: Negative.   Psychiatric/Behavioral: Negative.        Objective:   Physical Exam  Constitutional: He is oriented to person, place, and time. He appears well-developed and well-nourished.  Patient is pleasant and alert and in no distress  HENT:  Head: Normocephalic and atraumatic.  Right Ear: External ear normal.  Left Ear: External ear normal.  Nose: Nose normal.  Mouth/Throat: Oropharynx is clear and moist. No oropharyngeal exudate.  Eyes: Pupils are equal, round, and reactive to light. Conjunctivae and EOM are normal. Right eye exhibits no discharge. Left eye exhibits no discharge. No scleral icterus.  Up-to-date on eye exams  Neck: Normal range of motion. Neck supple. No thyromegaly present.  Radiated bruit from murmur and heart.  No thyroid enlargement no anterior cervical adenopathy  Cardiovascular: Normal rate, regular rhythm and intact distal pulses.  Murmur heard. The heart is regular at 60/min with a grade 3/6 systolic ejection murmur.  Pulmonary/Chest: Effort normal and breath sounds normal. He has no wheezes. He has no rales. He exhibits no tenderness.  Clear  anteriorly and posteriorly and no axillary adenopathy or chest wall masses or tenderness.  Abdominal: Soft. Bowel sounds are normal. He exhibits no mass. There is no tenderness.  No spleen or liver enlargement.  No epigastric tenderness.  No masses.  No inguinal adenopathy.  No bruits.  Musculoskeletal: Normal range of motion. He exhibits no edema.  Lymphadenopathy:    He has no cervical adenopathy.  Neurological: He is alert and oriented to person, place, and time. He has normal reflexes. No cranial nerve deficit.  Reflexes were 1+ and equal bilaterally  Skin: Skin is warm and dry. No rash noted.  Senile purpura specially both forearms.  Psychiatric: He has a normal mood and affect. His behavior is normal. Judgment and thought content normal.  She is enjoying his life and his mood affect and behavior appeared to be normal for him.  Nursing note and vitals reviewed.   BP (!) 115/50 (BP Location: Left Arm)   Pulse (!) 50   Temp (!) 96.9 F (36.1 C) (Oral)   Ht 5' 6.5" (1.689 m)   Wt 184 lb (83.5  kg)   BMI 29.25 kg/m        Assessment & Plan:  1. Long term (current) use of anticoagulants -Patient had pro time today and the INR was 2.5 he will continue with his current treatment regimen - CoaguChek XS/INR Waived - CBC with Differential/Platelet  2. PAF (paroxysmal atrial fibrillation) ([facility]) -The rhythm was actually regular today at 60/min - CoaguChek XS/INR Waived - CBC with Differential/Platelet  3. Atrial fibrillation, unspecified type ([place]) -Rhythm regular at 60/min -Follow-up with cardiology as planned - CoaguChek XS/INR Waived - CBC with Differential/Platelet  4. Vitamin D deficiency -Continue with vitamin D replacement pending results of lab work - CBC with Differential/Platelet - VITAMIN D 25 Hydroxy (Vit-D Deficiency, Fractures)  5. Essential hypertension -Blood pressure stable for patient at 115/50. - CBC with Differential/Platelet - BMP8+EGFR - Hepatic  function panel  6. Hyperlipidemia, unspecified hyperlipidemia type -Continue with current treatment - CBC with Differential/Platelet - Lipid panel  7. Thrombocytopenia ([place]) -Have more bruising today and we will check his platelet count. - CBC with Differential/Platelet  8. Right ear impacted cerumen -Ear irrigation to remove cerumen  9. Senile purpura ([place]) -Present on both arms.  10. Hypertrophic obstructive cardiomyopathy ([place]) -Follow-up with cardiology as planned  No orders of the defined types were placed in this encounter.  Patient Instructions                       Medicare Annual Wellness Visit  [facility] and the medical providers at [facility] strive to bring you the best medical care.  In doing so we not only want to address your current medical conditions and concerns but also to detect new conditions early and prevent illness, disease and health-related problems.    Medicare offers a yearly Wellness Visit which allows our clinical staff to assess your need for preventative services including immunizations, lifestyle education, counseling to decrease risk of preventable diseases and screening for fall risk and other medical concerns.    This visit is provided free of charge (no copay) for all Medicare recipients. The clinical pharmacists at [facility] have begun to conduct these Wellness Visits which will also include a thorough review of all your medications.    As you primary medical provider recommend that you make an appointment for your Annual Wellness Visit if you have not done so already this year.  You may set up this appointment before you leave today or you may call back ([phone]) and schedule an appointment.  Please make sure when you call that you mention that you are scheduling your Annual Wellness Visit with the clinical pharmacist so that the appointment may be made for the proper length of time.      Continue current medications. Continue good therapeutic lifestyle changes which include good diet and exercise. Fall precautions discussed with patient. If an FOBT was given today- please return it to our front desk. If you are over 43 years old - you may need Prevnar 67 or the adult Pneumonia vaccine.  **Flu shots are available--- please call and schedule a FLU-CLINIC appointment**  After your visit with [place] today you will receive a survey in the mail or online from [employer] regarding your care with [place]. Please take a moment to fill this out. Your feedback is very important to [place] as you can help [place] better understand your patient needs as well as improve your experience and satisfaction. WE CARE ABOUT YOU!!!  Use Debrox periodically to keep ear cerumen softer Follow-up with cardiology as planned We will call with lab work results as soon as these results become available Continue with Coumadin as directed by clinical pharmacy Take Tylenol for aches pains and fever  Arrie Senate MD

## 2017-11-14 LAB — HEPATIC FUNCTION PANEL
ALT: 18 IU/L (ref 0–44)
AST: 22 IU/L (ref 0–40)
Albumin: 3.8 g/dL (ref 3.5–4.8)
Alkaline Phosphatase: 55 IU/L (ref 39–117)
Bilirubin Total: 0.5 mg/dL (ref 0.0–1.2)
Bilirubin, Direct: 0.17 mg/dL (ref 0.00–0.40)
Total Protein: 6.6 g/dL (ref 6.0–8.5)

## 2017-11-14 LAB — LIPID PANEL
Chol/HDL Ratio: 2.7 ratio (ref 0.0–5.0)
Cholesterol, Total: 118 mg/dL (ref 100–199)
HDL: 43 mg/dL (ref >39)
LDL Calculated: 55 mg/dL (ref 0–99)
Triglycerides: 101 mg/dL (ref 0–149)
VLDL Cholesterol Cal: 20 mg/dL (ref 5–40)

## 2017-11-14 LAB — CBC WITH DIFFERENTIAL/PLATELET
Basophils Absolute: 0 10*3/uL (ref 0.0–0.2)
Basos: 1 %
EOS (ABSOLUTE): 0.1 10*3/uL (ref 0.0–0.4)
Eos: 3 %
Hematocrit: 44.4 % (ref 37.5–51.0)
Hemoglobin: 15.2 g/dL (ref 13.0–17.7)
Immature Grans (Abs): 0 10*3/uL (ref 0.0–0.1)
Immature Granulocytes: 0 %
Lymphocytes Absolute: 1.1 10*3/uL (ref 0.7–3.1)
Lymphs: 24 %
MCH: 32.1 pg (ref 26.6–33.0)
MCHC: 34.2 g/dL (ref 31.5–35.7)
MCV: 94 fL (ref 79–97)
Monocytes Absolute: 0.4 10*3/uL (ref 0.1–0.9)
Monocytes: 8 %
Neutrophils Absolute: 3 10*3/uL (ref 1.4–7.0)
Neutrophils: 64 %
Platelets: 123 10*3/uL — ABNORMAL LOW (ref 150–450)
RBC: 4.74 x10E6/uL (ref 4.14–5.80)
RDW: 12.3 % (ref 12.3–15.4)
WBC: 4.6 10*3/uL (ref 3.4–10.8)

## 2017-11-14 LAB — BMP8+EGFR
BUN/Creatinine Ratio: 16 (ref 10–24)
BUN: 19 mg/dL (ref 8–27)
CO2: 32 mmol/L — ABNORMAL HIGH (ref 20–29)
Calcium: 8.8 mg/dL (ref 8.6–10.2)
Chloride: 97 mmol/L (ref 96–106)
Creatinine, Ser: 1.17 mg/dL (ref 0.76–1.27)
GFR calc Af Amer: 71 mL/min/{1.73_m2} (ref >59)
GFR calc non Af Amer: 61 mL/min/{1.73_m2} (ref >59)
Glucose: 80 mg/dL (ref 65–99)
Potassium: 4.1 mmol/L (ref 3.5–5.2)
Sodium: 141 mmol/L (ref 134–144)

## 2017-11-14 LAB — VITAMIN D 25 HYDROXY (VIT D DEFICIENCY, FRACTURES): Vit D, 25-Hydroxy: 59 ng/mL (ref 30.0–100.0)

## 2017-11-19 ENCOUNTER — Ambulatory Visit: Payer: Medicare Other | Admitting: Cardiovascular Disease

## 2017-11-19 ENCOUNTER — Encounter: Payer: Self-pay | Admitting: Cardiovascular Disease

## 2017-11-19 VITALS — BP 140/52 | HR 58 | Ht 68.0 in | Wt 168.6 lb

## 2017-11-19 DIAGNOSIS — I519 Heart disease, unspecified: Secondary | ICD-10-CM

## 2017-11-19 DIAGNOSIS — I2510 Atherosclerotic heart disease of native coronary artery without angina pectoris: Secondary | ICD-10-CM | POA: Diagnosis not present

## 2017-11-19 DIAGNOSIS — I421 Obstructive hypertrophic cardiomyopathy: Secondary | ICD-10-CM | POA: Diagnosis not present

## 2017-11-19 DIAGNOSIS — I480 Paroxysmal atrial fibrillation: Secondary | ICD-10-CM

## 2017-11-19 DIAGNOSIS — I872 Venous insufficiency (chronic) (peripheral): Secondary | ICD-10-CM

## 2017-11-19 DIAGNOSIS — I5189 Other ill-defined heart diseases: Secondary | ICD-10-CM

## 2017-11-19 DIAGNOSIS — I351 Nonrheumatic aortic (valve) insufficiency: Secondary | ICD-10-CM

## 2017-11-19 DIAGNOSIS — Z7901 Long term (current) use of anticoagulants: Secondary | ICD-10-CM

## 2017-11-19 NOTE — Patient Instructions (Signed)

## 2017-11-19 NOTE — Progress Notes (Signed)
Patient ID: Joshua [name], male   DOB: Sep 16, 1945, 72 y.o.   MRN: [id number]      HPI: Joshua [name] is a 72 y.o. male presents to the office for an 8 month cardiology evaluation.  Mr. Zorn has a history of hypertrophic obstructive cardiomyopathy, CAD and is status post stenting of his proximal RCA and has distal PDA occlusion with left-to-right collaterals as well as stenting of his proximal LAD. His last cardiac catheterization in 2007 revealed both stents patent. He  has a history of paroxysmal atrial fibrillation for which she's been on chronic Coumadin therapy and did have an episode of atrial flutter but has been maintaining sinus rhythm on Norvasc therapy.  His last nuclear perfusion study in April 2012 showed normal perfusion.   In the past, he has had issues with significant peripheral edema and shortness of breath, but this has stabilized.  An echo Doppler study in April 2013 confirmed severe concentric left ventricular hypertrophy. He had a resting left ventricle outflow track subvalvular gradient of 25 mm. Ejection fraction was hyperdynamic at 70%. He did have significant mitral annular calcification with mild mitral stenosis with mild MR as well as aortic valve sclerosis with mild to moderate aortic insufficiency.  A followup echo Doppler study on 05/18/2013 was eessentially unchanged from 2 years previously.  Ejection fraction is 60-65%.  His peak LVOT velocity was 2.6 m/s, there is severe asymmetric left hypertrophy with mild systolic anterior motion of the mitral valve and severe mitral annular calcification with mild MS. There is felt to be very mild aortic stenosis and aortic insufficiency. The left atrium was moderately dilated.  His PA pressure was 32 mm.  He has a history of hyperlipidemia and an NMR lipoprotein ldone by [name] 2 years ago showed continued improvement of his lipids with an LDL particle number of 763, calculated LDL 63, HDL 44, triglycerides 113, total  cholesterol 130, and small LDL particle #232.  Insulin resistance score was 46.  [name] recently completed another NMR profile in Mayt 2017.  This continued to be excellent with an LDL particle #594, total cholesterol 126, LDL cholesterol 54, triglycerides 120, small LDL particles to 43.  Insulin resistance score was 44.  In 2017while kayaking in [place] he became overheated.  He denied associated palpitations or presyncope.   On 12/12/2015 a follow-up echo Doppler study showed an EF of 55-60%.  There was severe LVH.  There were no wall motion abnormalities.  There was grade 2 diastolic dysfunction and Doppler parameters were consistent with high ventricular filling pressure.  There was mild aortic insufficiency.  There was severe mitral annular calcification with possible mild stenosis.  Left atrium was moderately dilated.  There was mild pulmonary hypertension with an estimated PA pressure 35 mm.  He had a resting LVOT gradient of 2.3 m/s with a mean gradient of 11 mmHg.   When I last saw him, he had established care at the [facility].  He is unaware of any breakthrough atrial fibrillation.  The cost of his Norpace has significantly increased.  He was initially started on this by [name] with his atrial fibrillation in the setting of his hypertrophic obstructive cardiomyopathy.  This has worked well for him and he has tolerated it without side effects. .  [facility].  I recommended that he continue with Norpace.   I last saw him in January 2019 at which time he was doing  well from a cardiac standpoint.  He was experiencing  some muscle issues in his right back, most likely due to degenerative disc disease.  He denied any episodes of chest pain.  He was unaware of any presyncope or syncope and was without any change in activity or exertional dyspnea.   I last saw him, he underwent a follow-up echo Doppler study at the [facility] was done on April 11, 2017.  He again was noted to have severe asymmetric LVH with apical asymmetric hypertrophy.  No systolic anterior motion of the mitral valve was demonstrated.  At baseline on that study apparently there was no significant LVOT but I am not certain if any provocation was undertaken.  Ejection fraction was 60 to 65%.  He had normal regional wall motion.  His right ventricle was normal in size and function.  Left atrial volume was mildly increased.  He had mild to moderate aortic regurgitation, mild MR, and mild TR.  He had  moderate pulmonary hypertension with estimated PA pressure at 45 to 50 mm.  Has continued to feel well on his current regimen consisting of losartan 50 mg, HCTZ 12.5 mg, Norpace 300 mg twice a day, for his HOCM and PAF.  He has continued to be on rosuvastatin 10 mg for hyperlipidemia and is on warfarin for anticoagulation.  He presents for reevaluation.  Past Medical History:  Diagnosis Date  . Acute lower GI bleeding    related to gastritis / viral infection  . Atrial fibrillation (Joshua [name])    persistent  . Cataract   . Cirrhosis ([place])   . COPD (chronic obstructive pulmonary disease) ([place])    PFT 05-05-2010, FEV1 .9 (26%) ratio 60  . Coronary artery disease   . Gastritis 06-10-2006  . Hard of hearing   . Hiatal hernia 06-10-2006   EGD  . HOH (hard of hearing)   . Hx of adenomatous colonic polyps 2005, 2013   Colonoscopy  . Hypercholesteremia   . Hypercholesteremia   . Hypertension   . Hypertr obst cardiomyop    mild subaortic stenosis  . Iron deficiency anemia, unspecified   . LVH (left ventricular hypertrophy)   . Other B-complex deficiencies   . Rheumatic fever   . Thrombocytopenia ([place]) 05/2008    Past Surgical History:  Procedure Laterality Date  . CARDIAC CATHETERIZATION  05/28/2005   Widely patent coronaries and normal LV function.  Marland Kitchen CARDIAC CATHETERIZATION  08/11/2001   80% LAD stenosis successfully stented with a 3.5x81m Cypher DES postdilated to 3.958m resulting in reduction of 80% to 0%.  . CARDIAC CATHETERIZATION  01/15/2001   Focal 95% proximal RCA stenosis, stented with a [id number] multilink stent with postdilatation utilizing a 3.5x88m5mantum balloon with stenosis being reduced to 0%.  . CARDIOVASCULAR STRESS TEST  05/29/2011   No scintigraphic evidence of inducible myocardial ischemia. No ECG changes. EKG negative for ischemia.  . CAMarland KitchenDIOVERSION  May 2012   Dr. AllrRayann HemanCORONARY ANGIOPLASTY     2002-2003  . SHOULDER SURGERY    . TRANSTHORACIC ECHOCARDIOGRAM  05/29/2011   EF >70%, severe concentric LV hypertrophy, severe mitral annular calcification, mild-moderate aortic regurg,     Allergies  Allergen Reactions  . Tetracycline Other (See Comments)    Issue with stomach lining - can take with food if needed    Current Outpatient Medications  Medication Sig Dispense Refill  . acetaminophen (TYLENOL) 325 MG tablet Take 650 mg by mouth as needed.     .Marland Kitchen  aspirin EC 81 MG tablet Take 81 mg by mouth daily.    . cholecalciferol (VITAMIN D) 1000 units tablet Take 1,000 Units by mouth daily.    . cyanocobalamin ([employer] VITAMIN B12) 1000 MCG tablet Take 1 tablet (1,000 mcg total) by mouth daily.    . disopyramide (NORPACE) 150 MG capsule Take 2 capsules (300 mg total) by mouth 2 (two) times daily. 360 capsule 3  . hydrochlorothiazide (HYDRODIURIL) 25 MG tablet Take 0.5 tablets (12.5 mg total) by mouth as needed. 90 tablet 3  . losartan (COZAAR) 50 MG tablet Take 50 mg by mouth daily.    Marland Kitchen omeprazole (PRILOSEC) 10 MG capsule Take 1 capsule (10 mg total) by mouth daily. 90 capsule 3  . polyethylene glycol (MIRALAX / GLYCOLAX) packet Take 17 g by mouth every other day.    . rosuvastatin (CRESTOR) 10 MG tablet Take 1 tablet (10 mg total) by mouth daily. 90 tablet 3  . warfarin (JANTOVEN) 4 MG tablet TAKE 1 TABLET BY MOUTH DAILY, EXCEPT ON TUES ANDFRIDAY TAKE 1 AND 1/2TAB 115 tablet 3   No current facility-administered medications for this  visit.     Social History   Socioeconomic History  . Marital status: Widowed    Spouse name: Not on file  . Number of children: 5  . Years of education: Not on file  . Highest education level: Not on file  Occupational History  . 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 Financial resource strain: Not hard at all  . Food insecurity:    Worry: Never true    Inability: Never true  . Transportation needs:    Medical: No    Non-medical: No  Tobacco Use  . Smoking status: Former Smoker    Packs/day: 1.00    Years: 10.00    Pack years: 10.00    Types: Cigarettes    Last attempt to quit: 02/26/1985    Years since quitting: 32.7  . Smokeless tobacco: Never Used  Substance and Sexual Activity  . Alcohol use: No  . Drug use: No  . Sexual activity: Never  Lifestyle  . Physical activity:    Days per week: 0 days    Minutes per session: Not on file  . Stress: Only a little  Relationships  . Social connections:    Talks on phone: Not on file    Gets together: Not on file    Attends religious service: Not on file    Active member of club or organization: Not on file    Attends meetings of clubs or organizations: Not on file    Relationship status: Widowed  . Intimate partner violence:    Fear of current or ex partner: Not on file    Emotionally abused: Not on file    Physically abused: Not on file    Forced sexual activity: Not on file  Other Topics Concern  . Not on file  Social History Narrative  . Not on file    Family History  Problem Relation Age of Onset  . Heart attack Brother 88       Ventircular rupture  . Emphysema Brother   . Lung cancer Brother   . Heart disease Mother        Enlarged heart  . Stroke Mother   . Hyperlipidemia Father   . CAD Father   . Hypertension Sister   . CAD Paternal Uncle   . Stroke Paternal Uncle   .  Cancer Maternal Grandfather        colon  . Colon cancer Maternal Grandfather   . Asthma  Brother    Socially he tries to remain active. He does kayak. He also does work in his yard. He is widowed. He has 4 children one deceased. One grandchild. Is no tobacco or alcohol use.  ROS General: Negative; No fevers, chills, or night sweats;  HEENT: Negative; No changes in vision or hearing, sinus congestion, difficulty swallowing Pulmonary: Negative; No cough, wheezing, shortness of breath, hemoptysis Cardiovascular: Negative; No chest pain, presyncope, syncope, palpitations GI: Negative; No nausea, vomiting, diarrhea, or abdominal pain GU: Negative; No dysuria, hematuria, or difficulty voiding Musculoskeletal: Right back pain Hematologic/Oncology: Positive for remote history of iron deficiency anemia no easy bruising, bleeding Endocrine: Negative; no heat/cold intolerance; no diabetes Neuro: Negative; no changes in balance, headaches Skin: Brawny lower extremity skin changes from prior chronic edema;  No rashes or skin lesions Psychiatric: Negative; No behavioral problems, depression Sleep: Negative; No snoring, daytime sleepiness, hypersomnolence, bruxism, restless legs, hypnogognic hallucinations, no cataplexy Other comprehensive 14 point system review is negative.   PE BP (!) 140/52   Pulse (!) 58   Ht 5' 8"  (1.727 m)   Wt 168 lb 9.6 oz (76.5 kg)   BMI 25.64 kg/m    Repeat blood pressure by me was 122/70.  There is no orthostatic change.  Wt Readings from Last 3 Encounters:  11/19/17 168 lb 9.6 oz (76.5 kg)  11/13/17 184 lb (83.5 kg)  08/27/17 188 lb 9.6 oz (85.5 kg)    General: Alert, oriented, no distress.  Skin: normal turgor, no rashes, warm and dry HEENT: Normocephalic, atraumatic. Pupils equal round and reactive to light; sclera anicteric; extraocular muscles intact;  Nose without nasal septal hypertrophy Mouth/Parynx benign; Mallinpatti scale 3 Neck: No JVD, no carotid bruits; normal carotid upstroke Lungs: clear to ausculatation and percussion; no wheezing  or rales Chest wall: without tenderness to palpitation Heart: PMI not displaced, RRR, s1 s2 normal, 2/6 systolic murmur, no diastolic murmur, no rubs, gallops, thrills, or heaves.  In the standing position, the murmur decreased with squatting and again increased with standing. Abdomen: soft, nontender; no hepatosplenomehaly, BS+; abdominal aorta nontender and not dilated by palpation. Back: no CVA tenderness Pulses 2+ Musculoskeletal: full range of motion, normal strength, no joint deformities Extremities: venous changes to his lower extremity no clubbing, cyanosis , Homan's sign negative  Neurologic: grossly nonfocal; Cranial nerves grossly wnl Psychologic: Normal mood and affect  ECG (independently read by me): Sinus bradycardia 58 bpm.  First-degree AV block.  Left bundle branch block.  No ectopy.  January 2019 ECG (independently read by me): Sinus bradycardia 59 bpm.  First degree block with a PR interval at 274 ms.  Left bundle branch block.  October 2018 ECG (independently read by me): Sinus bradycardia at 59 bpm with first-degree AV block with PR interval at 290 ms.  Left bundle branch block.  November 2017 ECG (independently read by me): Sinus bradycardia 52 bpm.  First-degree heart block with a PR interval at 270 ms.  QTc interval 496 ms.  May 2017 ECG (independently read by me): Sinus bradycardia 59 bpm.  Nonspecific intraventricular conduction delay.  T-wave changes in 3 and aVF.  April 2016 ECG (independently read by me): Sinus bradycardia at 56 bpm.  First-degree AV block.  Left bundle branch block.  October 2015 ECG (independently read by me): Sinus rhythm at 60 beats per minute with first degree AV  block with a PR interval of 236 ms.  LDH with QRS widening.  QTC 514 ms.  Prior 06/15/2013 ECG (independently read by me and (sinus rhythm at 59 beats per minute.  First degree AV block with PR interval 220 ms.  Increased QTC of 500 ms.  Interventricular conduction delay.  Prior  10/ 22/2014 ECG: Sinus rhythm with first-degree AV block with PR interval 232 ms. QT interval 495 ms.  LABS:  BMP Latest Ref Rng & Units 11/13/2017 05/07/2017 03/22/2017  Glucose 65 - 99 mg/dL 80 78 87  BUN 8 - 27 mg/dL 19 22 18   Creatinine 0.76 - 1.27 mg/dL 1.17 1.18 1.14  BUN/Creat Ratio 10 - 24 16 19 16   Sodium 134 - 144 mmol/L 141 141 142  Potassium 3.5 - 5.2 mmol/L 4.1 4.2 4.2  Chloride 96 - 106 mmol/L 97 98 97  CO2 20 - 29 mmol/L 32(H) 30(H) 29  Calcium 8.6 - 10.2 mg/dL 8.8 8.5(L) 8.9    Hepatic Function Latest Ref Rng & Units 11/13/2017 05/07/2017 03/22/2017  Total Protein 6.0 - 8.5 g/dL 6.6 6.7 6.7  Albumin 3.5 - 4.8 g/dL 3.8 3.7 3.9  AST 0 - 40 IU/L 22 29 23   ALT 0 - 44 IU/L 18 21 17   Alk Phosphatase 39 - 117 IU/L 55 55 53  Total Bilirubin 0.0 - 1.2 mg/dL 0.5 0.6 0.5  Bilirubin, Direct 0.00 - 0.40 mg/dL 0.17 0.18 -     CBC Latest Ref Rng & Units 11/13/2017 05/07/2017 10/31/2016  WBC 3.4 - 10.8 x10E3/uL 4.6 4.7 5.0  Hemoglobin 13.0 - 17.7 g/dL 15.2 15.2 16.1  Hematocrit 37.5 - 51.0 % 44.4 45.4 46.9  Platelets 150 - 450 x10E3/uL 123(L) 114(L) 113(L)   Lab Results  Component Value Date   MCV 94 11/13/2017   MCV 96 05/07/2017   MCV 92 10/31/2016    No results found for: HGBA1C   Lab Results  Component Value Date   TSH 3.190 05/07/2017    Lab Results  Component Value Date   TSH 3.190 05/07/2017       Component Value Date/Time   PROBNP 541.0 (H) 05/05/2010 1158    Lipid Panel     Component Value Date/Time   CHOL 118 11/13/2017 0922   CHOL 126 08/20/2012 0930   TRIG 101 11/13/2017 0922   TRIG 113 10/31/2016 0850   TRIG 94 08/20/2012 0930   HDL 43 11/13/2017 0922   HDL 46 10/31/2016 0850   HDL 41 08/20/2012 0930   CHOLHDL 2.7 11/13/2017 0922   LDLCALC 55 11/13/2017 0922   LDLCALC 63 09/29/2013 0907   LDLCALC 66 08/20/2012 0930     RADIOLOGY: No results found.  IMPRESSION:  1. Hypertrophic obstructive cardiomyopathy ([place])   2. Grade II diastolic  dysfunction   3. CAD in native artery   4. PAF (paroxysmal atrial fibrillation) ([place])   5. Aortic valve insufficiency, etiology of cardiac valve disease unspecified   6. Anticoagulation adequate   7. Venous stasis dermatitis of both lower extremities      ASSESSMENT AND PLAN: Mr. Doral Ventrella is a 72 year old male who has a history of hypertrophic obstructive cardiomyopathy and his gradients have remained stable on subsequent echocardiographic evaluations.  He  is status post two-vessel coronary intervention and at last catheterization a patent stents in his LAD and proximal right coronary artery.  He had developed atrial fibrillation for which she was started on chronic warfarin therapy and had an episode of atrial flutter which  is stabilized on Norvasc therapy, started by [name] in light of his HOCM.  His insurance initially had wanted to change him to a different antiarrhythmic.  I believe this is the best antiarrhythmic med for him and he has not had any recurrence of arrhythmia and has tolerated it with reference to his obstructive cardiomyopathy.  I do not believe he is a  candidate for amiodarone due to prior lung issues.  Presently maintaining sinus rhythm on Norpace.  He continues to be on warfarin for anticoagulation but has not had any recurrence of arrhythmia.  His blood pressure today is controlled on losartan 50 mg and he continues to be on very low-dose HCTZ 12.5 mg without recurrent edema.  I reviewed his most recent echo Doppler study which was done at the [facility].  On that study LVOT obstruction was not identified but his findings were consistent with severe asymmetric septal hypertrophy.  I reviewed recent laboratory.  He continues to be on rosuvastatin 10 mg.  Lipid studies are excellent with an LDL of 55.  He is tolerating treatment.  GERD is controlled with omeprazole.  There is no edema but he does have venous stasis changes from his previous chronic lower extremity edema.   As long as he remains stable I will see him in 1 year for reevaluation.  Time spent: 25 minutes  Troy Sine, MD, [facility]  11/20/2017 6:49 PM

## 2017-11-20 ENCOUNTER — Encounter: Payer: Self-pay | Admitting: Cardiovascular Disease

## 2017-11-22 ENCOUNTER — Ambulatory Visit (INDEPENDENT_AMBULATORY_CARE_PROVIDER_SITE_OTHER): Payer: Medicare Other

## 2017-11-22 DIAGNOSIS — Z23 Encounter for immunization: Secondary | ICD-10-CM | POA: Diagnosis not present

## 2017-12-16 ENCOUNTER — Telehealth: Payer: Self-pay | Admitting: Cardiovascular Disease

## 2017-12-16 NOTE — Telephone Encounter (Signed)
New message    1. What dental office are you calling from? [facility]  2. What is your office phone number? [phone]   3. What is your fax number? [phone]  4. What type of procedure is the patient having performed? Extraction   5. What date is procedure scheduled or is the patient there now? Not scheduled yet.   6. What is your question (ex. Antibiotics prior to procedure, holding medication-we need to know how long dentist wants pt to hold med)? How long should pt hold warfarin for extraction.

## 2017-12-18 NOTE — Telephone Encounter (Signed)
Recommend continuing warfarin for single dental extraction. 

## 2017-12-18 NOTE — Telephone Encounter (Signed)
Pt just seen by Dr Claiborne Billings 11/19/17 and was stable. No indication for SBE prophylaxis. I will ask pharmacy about Coumadin.  Kerin Ransom PA-C 12/18/2017 2:46 PM

## 2017-12-25 ENCOUNTER — Ambulatory Visit: Payer: Medicare Other | Admitting: Pharmacist Clinician (PhC)/ Clinical Pharmacy Specialist

## 2017-12-25 DIAGNOSIS — I4891 Unspecified atrial fibrillation: Secondary | ICD-10-CM | POA: Diagnosis not present

## 2017-12-25 DIAGNOSIS — Z7901 Long term (current) use of anticoagulants: Secondary | ICD-10-CM | POA: Diagnosis not present

## 2017-12-25 DIAGNOSIS — I480 Paroxysmal atrial fibrillation: Secondary | ICD-10-CM

## 2017-12-25 LAB — COAGUCHEK XS/INR WAIVED
INR: 2.7 — ABNORMAL HIGH (ref 0.9–1.1)
Prothrombin Time: 32.3 s

## 2017-12-25 NOTE — Patient Instructions (Signed)
Description   Continue taking 1 1/2 tablets on Mondays, Thursdays, and Saturdays and 1 tablet all other days of the week.  INR today is 2.7 today ( goal is 2.0-3.0)  Perfect reading

## 2018-01-13 ENCOUNTER — Encounter: Payer: Medicare Other | Admitting: *Deleted

## 2018-01-14 ENCOUNTER — Encounter: Payer: Self-pay | Admitting: *Deleted

## 2018-01-14 ENCOUNTER — Ambulatory Visit (INDEPENDENT_AMBULATORY_CARE_PROVIDER_SITE_OTHER): Payer: Medicare Other | Admitting: *Deleted

## 2018-01-14 VITALS — BP 136/58 | HR 57 | Ht 66.5 in | Wt 188.0 lb

## 2018-01-14 DIAGNOSIS — Z0000 Encounter for general adult medical examination without abnormal findings: Secondary | ICD-10-CM | POA: Diagnosis not present

## 2018-01-14 DIAGNOSIS — Z23 Encounter for immunization: Secondary | ICD-10-CM

## 2018-01-14 NOTE — Progress Notes (Addendum)
Subjective:   Joshua [name] is a 72 y.o. male who presents for Medicare Annual/Subsequent preventive examination.  Mr. 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  He enjoys cooking, Web designer, and babysitting his grand daughter.  He is active in his church, and volunteers for [employer] and [employer] program.  Mr. Waterson has 5 children, 1 passed away while serving in [place] in the 1990s, 2 biological grandchildren and 4 step grandchildren.  He is widowed, and he has 2 inside dogs.  His 58 year old step daughter who deals with mental health illness lives with him.  He feels his health is unchanged from last year, and reports no surgeries, ER visits or hospitalizations in the past year.   Review of Systems:   All negative today  Cardiac Risk Factors include: advanced age (>39men, >78 women);dyslipidemia;family history of premature cardiovascular disease;hypertension;male gender     Objective:    Vitals: BP (!) 136/58   Pulse (!) 57   Ht 5' 6.5" (1.689 m)   Wt 188 lb (85.3 kg)   BMI 29.89 kg/m   Body mass index is 29.89 kg/m.  Advanced Directives 01/14/2018 01/05/2016 01/15/2015 11/24/2014 11/24/2014  Does Patient Have a Medical Advance Directive? Yes Yes Yes - Yes  Type of Advance Directive [place];Living will [place];Living will - - [place];Living will  Does patient want to make changes to medical advance directive? No - Patient declined No - Patient declined - - No - Patient declined  Copy of [place] in Chart? No - copy requested No - copy requested - (No Data) No - copy requested    Tobacco Social History   Tobacco Use  Smoking Status Former Smoker  . Packs/day: 1.00  . Years: 10.00  . Pack years: 10.00  . Types: Cigarettes  . Last attempt to  quit: 02/26/1985  . Years since quitting: 32.9  Smokeless Tobacco Never Used     Counseling given: No   Clinical Intake:     Pain Score: 0-No pain                 Past Medical History:  Diagnosis Date  . Acute lower GI bleeding    related to gastritis / viral infection  . Atrial fibrillation ([place])    persistent  . Cataract   . Cirrhosis ([place])   . COPD (chronic obstructive pulmonary disease) ([place])    PFT 05-05-2010, FEV1 .9 (26%) ratio 60  . Coronary artery disease   . Gastritis 06-10-2006  . Hard of hearing   . Hiatal hernia 06-10-2006   EGD  . HOH (hard of hearing)   . Hx of adenomatous colonic polyps 2005, 2013   Colonoscopy  . Hypercholesteremia   . Hypercholesteremia   . Hypertension   . Hypertr obst cardiomyop    mild subaortic stenosis  . Iron deficiency anemia, unspecified   . LVH (left ventricular hypertrophy)   . Other B-complex deficiencies   . Rheumatic fever   . Thrombocytopenia ([place]) 05/2008   Past Surgical History:  Procedure Laterality Date  . CARDIAC CATHETERIZATION  05/28/2005   Widely patent coronaries and normal LV function.  Marland Kitchen CARDIAC CATHETERIZATION  08/11/2001   80% LAD stenosis successfully stented with a 3.5x76mm Cypher DES postdilated to 3.36mm resulting in reduction of  80% to 0%.  . CARDIAC CATHETERIZATION  01/15/2001   Focal 95% proximal RCA stenosis, stented with a 3x10mm Zeta multilink stent with postdilatation utilizing a 3.5x34mm Quantum balloon with stenosis being reduced to 0%.  . CARDIOVASCULAR STRESS TEST  05/29/2011   No scintigraphic evidence of inducible myocardial ischemia. No ECG changes. EKG negative for ischemia.  Marland Kitchen CARDIOVERSION  May 2012   [name]  . CORONARY ANGIOPLASTY     2002-2003  . SHOULDER SURGERY    . TRANSTHORACIC ECHOCARDIOGRAM  05/29/2011   EF >70%, severe concentric LV hypertrophy, severe mitral annular calcification, mild-moderate aortic regurg,    Family History  Problem Relation Age of Onset  .  Heart attack Brother 6       Ventircular rupture  . Emphysema Brother   . Lung cancer Brother   . Heart disease Mother        Enlarged heart  . Stroke Mother   . Hyperlipidemia Father   . CAD Father   . Hypertension Sister   . CAD Paternal Uncle   . Stroke Paternal Uncle   . Cancer Maternal Grandfather        colon  . Colon cancer Maternal Grandfather   . Asthma Brother    Social History   Socioeconomic History  . Marital status: Widowed    Spouse name: Not on file  . Number of children: 5  . Years of education: Not on file  . Highest education level: Not on file  Occupational History  . 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 Financial resource strain: Not hard at all  . Food insecurity:    Worry: Never true    Inability: Never true  . Transportation needs:    Medical: No    Non-medical: No  Tobacco Use  . Smoking status: Former Smoker    Packs/day: 1.00    Years: 10.00    Pack years: 10.00    Types: Cigarettes    Last attempt to quit: 02/26/1985    Years since quitting: 32.9  . Smokeless tobacco: Never Used  Substance and Sexual Activity  . Alcohol use: No  . Drug use: No  . Sexual activity: Not on file  Lifestyle  . Physical activity:    Days per week: 7 days    Minutes per session: 60 min  . Stress: Only a little  Relationships  . Social connections:    Talks on phone: More than three times a week    Gets together: Three times a week    Attends religious service: More than 4 times per year    Active member of club or organization: Yes    Attends meetings of clubs or organizations: More than 4 times per year    Relationship status: Widowed  Other Topics Concern  . Not on file  Social History Narrative  . Not on file    Outpatient Encounter Medications as of 01/14/2018  Medication Sig  . acetaminophen (TYLENOL) 325 MG tablet Take 650 mg by mouth as needed.   Marland Kitchen aspirin EC 81 MG tablet Take 81 mg by mouth daily.    . cholecalciferol (VITAMIN D) 1000 units tablet Take 1,000 Units by mouth daily.  . cyanocobalamin ([employer]S VITAMIN B12) 1000 MCG tablet Take 1 tablet (1,000 mcg total) by mouth daily.  . disopyramide (NORPACE) 150 MG capsule Take 2 capsules (300 mg total) by mouth 2 (two) times daily.  . hydrochlorothiazide (HYDRODIURIL) 25 MG  tablet Take 0.5 tablets (12.5 mg total) by mouth as needed.  Marland Kitchen losartan (COZAAR) 50 MG tablet Take 50 mg by mouth daily.  Marland Kitchen omeprazole (PRILOSEC) 10 MG capsule Take 1 capsule (10 mg total) by mouth daily.  . polyethylene glycol (MIRALAX / GLYCOLAX) packet Take 17 g by mouth every other day.  . rosuvastatin (CRESTOR) 10 MG tablet Take 1 tablet (10 mg total) by mouth daily.  Marland Kitchen warfarin (JANTOVEN) 4 MG tablet TAKE 1 TABLET BY MOUTH DAILY, EXCEPT ON TUES ANDFRIDAY TAKE 1 AND 1/2TAB   No facility-administered encounter medications on file as of 01/14/2018.     Activities of Daily Living In your present state of health, do you have any difficulty performing the following activities: 01/14/2018  Hearing? Y  Comment Wears hearing aids in both ears.  Sees augiologist at the [employer]? N  Comment Gets [facility]  Difficulty concentrating or making decisions? N  Walking or climbing stairs? N  Dressing or bathing? N  Doing errands, shopping? N  Preparing Food and eating ? N  Using the Toilet? N  In the past six months, have you accidently leaked urine? N  Do you have problems with loss of bowel control? N  Managing your Medications? N  Managing your Finances? N  Housekeeping or managing your Housekeeping? N  Some recent data might be hidden    Patient Care Team: Chipper Herb, MD as PCP - General (Family Medicine) Troy Sine, MD as PCP - Cardiology (Cardiology) Ladene Artist, MD as Consulting Physician (Gastroenterology) Steffanie Rainwater, DPM as Consulting Physician (Podiatry)   [facility] in [place], [place]  Assessment:   This is a routine  wellness examination for [place].  Exercise Activities and Dietary recommendations  Mr. Lenz states he eats 2-3 meals per day.  Usually makes a breakfast burrito at home with vegetables, egg, rice, cheese, and flour tortilla, cottage cheese and vegetables or fruit for lunch, and chicken or other protein and vegetables for supper.  He describes a healthy diet of mostly non-starchy vegetables (he monitors his intake of green leafy vegetables due to INR), fruits, and lean proteins.  Healthy cooking methods discussed. Current Exercise Habits: Home exercise routine, Type of exercise: walking(Yard work and walking daily), Time (Minutes): 60, Intensity: Mild  Goals    . Weight (lb) < 183 lb (83 kg)     Reduce portion sizes.        Fall Risk Fall Risk  01/14/2018 11/13/2017 08/27/2017 07/19/2017 05/07/2017  Falls in the past year? 0 No No No No  Number falls in past yr: - - - - -  Comment - - - - -   Is the patient's home free of loose throw rugs in walkways, pet beds, electrical cords, etc?   no, patient states he has throw rugs, but all with no skid backing and taped down      Grab bars in the bathroom? yes      Handrails on the stairs?   yes      Adequate lighting?   yes   Depression Screen PHQ 2/9 Scores 01/14/2018 11/13/2017 08/27/2017 07/19/2017  PHQ - 2 Score 0 0 0 0  PHQ- 9 Score - - - -    Cognitive Function MMSE - Mini Mental State Exam 01/14/2018 01/08/2017 01/05/2016 11/24/2014  Orientation to time 5 5 5 5   Orientation to Place 5 5 5 5   Registration 3 3 3 3   Attention/ Calculation 5 4  3 2  Recall 3 3 3 3   Language- name 2 objects 2 2 2 2   Language- repeat 1 1 1 1   Language- follow 3 step command 3 3 3 3   Language- read & follow direction 1 1 1 1   Write a sentence 1 1 1 1   Copy design 1 1 1 1   Total score 30 29 28 27         Immunization History  Administered Date(s) Administered  . Influenza Split 11/25/2012  . Influenza Whole 10/27/2009  . Influenza, High Dose Seasonal  PF 12/02/2015, 12/14/2016, 11/22/2017  . Influenza,inj,Quad PF,6+ Mos 12/14/2014  . Influenza-Unspecified 11/10/2013  . Pneumococcal Conjugate-13 01/29/2013  . Pneumococcal Polysaccharide-23 04/27/2011  . Tdap 10/26/2010    Qualifies for Shingles Vaccine? Yes, first dose given today  Screening Tests Health Maintenance  Topic Date Due  . TETANUS/TDAP  10/25/2020  . COLONOSCOPY  12/04/2021  . INFLUENZA VACCINE  Completed  . Hepatitis C Screening  Completed  . PNA vac Low Risk Adult  Completed   Cancer Screenings: Lung: Low Dose CT Chest recommended if Age 20-80 years, 30 pack-year currently smoking OR have quit w/in 15years. Patient does not qualify. Colorectal: up to date  Additional Screenings:  Hepatitis C Screening: completed 01/15/15      Plan:     Work on your goal of losing 5 lbs, by reducing portion sizes.  Bring a copy of your healthcare power of attorney and living will to our office to be filed in your medical record.  You will need the 2nd dose after 03/16/2018.  Follow up with [name] and the [facility] as scheduled.   I have personally reviewed and noted the following in the patient's chart:   . Medical and social history . Use of alcohol, tobacco or illicit drugs  . Current medications and supplements . Functional ability and status . Nutritional status . Physical activity . Advanced directives . List of other physicians . Hospitalizations, surgeries, and ER visits in previous 12 months . Vitals . Screenings to include cognitive, depression, and falls . Referrals and appointments  In addition, I have reviewed and discussed with patient certain preventive protocols, quality metrics, and best practice recommendations. A written personalized care plan for preventive services as well as general preventive health recommendations were provided to patient.     WYATT, AMY M, RN  01/14/2018  I have reviewed and agree with the above AWV documentation.    Arrie Senate MD

## 2018-01-14 NOTE — Patient Instructions (Addendum)
You set a goal of losing 5 lbs, and to work on your goal you plan to reduce portion sizes.    At your convenience, please bring a copy of your healthcare power of attorney and living will to our office to be filed in your medical record.   You received your 1st Shingrix (Shingles) vaccine, you will need the 2nd dose after 03/16/2018.   Please follow up with [name] and the [facility] as scheduled.  Thank you for coming in for your Annual Wellness Visit today!!   Preventive Care 65 Years and Older, Male Preventive care refers to lifestyle choices and visits with your health care provider that can promote health and wellness. What does preventive care include?  A yearly physical exam. This is also called an annual well check.  Dental exams once or twice a year.  Routine eye exams. Ask your health care provider how often you should have your eyes checked.  Personal lifestyle choices, including: ? Daily care of your teeth and gums. ? Regular physical activity. ? Eating a healthy diet. ? Avoiding tobacco and drug use. ? Limiting alcohol use. ? Practicing safe sex. ? Taking low doses of aspirin every day. ? Taking vitamin and mineral supplements as recommended by your health care provider. What happens during an annual well check? The services and screenings done by your health care provider during your annual well check will depend on your age, overall health, lifestyle risk factors, and family history of disease. Counseling Your health care provider may ask you questions about your:  Alcohol use.  Tobacco use.  Drug use.  Emotional well-being.  Home and relationship well-being.  Sexual activity.  Eating habits.  History of falls.  Memory and ability to understand (cognition).  Work and work Statistician.  Screening You may have the following tests or measurements:  Height, weight, and BMI.  Blood pressure.  Lipid and cholesterol levels. These may be checked every 5  years, or more frequently if you are over 73 years old.  Skin check.  Lung cancer screening. You may have this screening every year starting at age 73 if you have a 30-pack-year history of smoking and currently smoke or have quit within the past 15 years.  Fecal occult blood test (FOBT) of the stool. You may have this test every year starting at age 73.  Flexible sigmoidoscopy or colonoscopy. You may have a sigmoidoscopy every 5 years or a colonoscopy every 10 years starting at age 73.  Prostate cancer screening. Recommendations will vary depending on your family history and other risks.  Hepatitis C blood test.  Hepatitis B blood test.  Sexually transmitted disease (STD) testing.  Diabetes screening. This is done by checking your blood sugar (glucose) after you have not eaten for a while (fasting). You may have this done every 1-3 years.  Abdominal aortic aneurysm (AAA) screening. You may need this if you are a current or former smoker.  Osteoporosis. You may be screened starting at age 73 if you are at high risk.  Talk with your health care provider about your test results, treatment options, and if necessary, the need for more tests. Vaccines Your health care provider may recommend certain vaccines, such as:  Influenza vaccine. This is recommended every year.  Tetanus, diphtheria, and acellular pertussis (Tdap, Td) vaccine. You may need a Td booster every 10 years.  Varicella vaccine. You may need this if you have not been vaccinated.  Zoster vaccine. You may need this after  age 40.  Measles, mumps, and rubella (MMR) vaccine. You may need at least one dose of MMR if you were born in 1957 or later. You may also need a second dose.  Pneumococcal 13-valent conjugate (PCV13) vaccine. One dose is recommended after age 73.  Pneumococcal polysaccharide (PPSV23) vaccine. One dose is recommended after age 73.  Meningococcal vaccine. You may need this if you have certain  conditions.  Hepatitis A vaccine. You may need this if you have certain conditions or if you travel or work in places where you may be exposed to hepatitis A.  Hepatitis B vaccine. You may need this if you have certain conditions or if you travel or work in places where you may be exposed to hepatitis B.  Haemophilus influenzae type b (Hib) vaccine. You may need this if you have certain risk factors.  Talk to your health care provider about which screenings and vaccines you need and how often you need them. This information is not intended to replace advice given to you by your health care provider. Make sure you discuss any questions you have with your health care provider. Document Released: 03/11/2015 Document Revised: 11/02/2015 Document Reviewed: 12/14/2014 Elsevier Interactive Patient Education  [employer].

## 2018-01-22 ENCOUNTER — Encounter: Payer: Self-pay | Admitting: Family Medicine

## 2018-02-12 ENCOUNTER — Ambulatory Visit: Payer: Medicare Other | Admitting: Pharmacist Clinician (PhC)/ Clinical Pharmacy Specialist

## 2018-02-12 DIAGNOSIS — I4891 Unspecified atrial fibrillation: Secondary | ICD-10-CM | POA: Diagnosis not present

## 2018-02-12 DIAGNOSIS — Z7901 Long term (current) use of anticoagulants: Secondary | ICD-10-CM | POA: Diagnosis not present

## 2018-02-12 DIAGNOSIS — I480 Paroxysmal atrial fibrillation: Secondary | ICD-10-CM | POA: Diagnosis not present

## 2018-02-12 LAB — COAGUCHEK XS/INR WAIVED
INR: 1.7 — ABNORMAL HIGH (ref 0.9–1.1)
Prothrombin Time: 21 s

## 2018-02-12 NOTE — Patient Instructions (Signed)
Description   6mg  today and 8 mg tomorrow then continue taking 1 1/2 tablets on Mondays, Thursdays, and Saturdays and 1 tablet all other days of the week.  INR today is 1.7 today ( goal is 2.0-3.0)  Too thick today

## 2018-03-19 ENCOUNTER — Ambulatory Visit: Payer: Medicare Other | Admitting: Pharmacist Clinician (PhC)/ Clinical Pharmacy Specialist

## 2018-03-19 DIAGNOSIS — I480 Paroxysmal atrial fibrillation: Secondary | ICD-10-CM

## 2018-03-19 DIAGNOSIS — I4891 Unspecified atrial fibrillation: Secondary | ICD-10-CM | POA: Diagnosis not present

## 2018-03-19 DIAGNOSIS — Z7901 Long term (current) use of anticoagulants: Secondary | ICD-10-CM

## 2018-03-19 DIAGNOSIS — I482 Chronic atrial fibrillation, unspecified: Secondary | ICD-10-CM | POA: Diagnosis not present

## 2018-03-19 LAB — COAGUCHEK XS/INR WAIVED
INR: 2.1 — ABNORMAL HIGH (ref 0.9–1.1)
Prothrombin Time: 25.2 s

## 2018-03-19 NOTE — Patient Instructions (Incomplete)
Description   Continue taking 1 1/2 tablets on Mondays, Thursdays, and 1 tablet all other days of the week.  INR today is 2.1 today ( goal is 2.0-3.0)  Perfect reading today!

## 2018-04-30 ENCOUNTER — Encounter: Payer: Self-pay | Admitting: Pharmacist Clinician (PhC)/ Clinical Pharmacy Specialist

## 2018-04-30 ENCOUNTER — Ambulatory Visit: Payer: Medicare Other | Admitting: Pharmacist Clinician (PhC)/ Clinical Pharmacy Specialist

## 2018-04-30 DIAGNOSIS — I482 Chronic atrial fibrillation, unspecified: Secondary | ICD-10-CM

## 2018-04-30 DIAGNOSIS — Z7901 Long term (current) use of anticoagulants: Secondary | ICD-10-CM

## 2018-04-30 DIAGNOSIS — I4891 Unspecified atrial fibrillation: Secondary | ICD-10-CM

## 2018-04-30 DIAGNOSIS — I480 Paroxysmal atrial fibrillation: Secondary | ICD-10-CM

## 2018-04-30 LAB — COAGUCHEK XS/INR WAIVED
INR: 2.7 — ABNORMAL HIGH (ref 0.9–1.1)
Prothrombin Time: 32.8 s

## 2018-04-30 NOTE — Patient Instructions (Signed)
Description   Continue taking 1 1/2 tablets on Mondays, Thursdays, and 1 tablet all other days of the week.  INR today is 2.7 today ( goal is 2.0-3.0)  Perfect reading today!

## 2018-05-14 ENCOUNTER — Other Ambulatory Visit: Payer: Self-pay

## 2018-05-14 ENCOUNTER — Encounter: Payer: Self-pay | Admitting: Family Medicine

## 2018-05-14 ENCOUNTER — Ambulatory Visit: Payer: Medicare Other | Admitting: Family Medicine

## 2018-05-14 VITALS — BP 115/47 | HR 50 | Temp 96.8°F | Ht 66.5 in | Wt 186.0 lb

## 2018-05-14 DIAGNOSIS — E785 Hyperlipidemia, unspecified: Secondary | ICD-10-CM

## 2018-05-14 DIAGNOSIS — Z86010 Personal history of colonic polyps: Secondary | ICD-10-CM

## 2018-05-14 DIAGNOSIS — Z0001 Encounter for general adult medical examination with abnormal findings: Secondary | ICD-10-CM | POA: Diagnosis not present

## 2018-05-14 DIAGNOSIS — M150 Primary generalized (osteo)arthritis: Secondary | ICD-10-CM

## 2018-05-14 DIAGNOSIS — D692 Other nonthrombocytopenic purpura: Secondary | ICD-10-CM

## 2018-05-14 DIAGNOSIS — N400 Enlarged prostate without lower urinary tract symptoms: Secondary | ICD-10-CM

## 2018-05-14 DIAGNOSIS — I480 Paroxysmal atrial fibrillation: Secondary | ICD-10-CM

## 2018-05-14 DIAGNOSIS — I10 Essential (primary) hypertension: Secondary | ICD-10-CM | POA: Diagnosis not present

## 2018-05-14 DIAGNOSIS — I482 Chronic atrial fibrillation, unspecified: Secondary | ICD-10-CM

## 2018-05-14 DIAGNOSIS — R8290 Unspecified abnormal findings in urine: Secondary | ICD-10-CM

## 2018-05-14 DIAGNOSIS — M8949 Other hypertrophic osteoarthropathy, multiple sites: Secondary | ICD-10-CM

## 2018-05-14 DIAGNOSIS — M159 Polyosteoarthritis, unspecified: Secondary | ICD-10-CM

## 2018-05-14 DIAGNOSIS — D696 Thrombocytopenia, unspecified: Secondary | ICD-10-CM

## 2018-05-14 DIAGNOSIS — K7460 Unspecified cirrhosis of liver: Secondary | ICD-10-CM

## 2018-05-14 DIAGNOSIS — Z0000 Encounter for general adult medical examination without abnormal findings: Secondary | ICD-10-CM

## 2018-05-14 DIAGNOSIS — I421 Obstructive hypertrophic cardiomyopathy: Secondary | ICD-10-CM

## 2018-05-14 DIAGNOSIS — E559 Vitamin D deficiency, unspecified: Secondary | ICD-10-CM | POA: Diagnosis not present

## 2018-05-14 LAB — MICROSCOPIC EXAMINATION
Bacteria, UA: NONE SEEN
RBC, UA: NONE SEEN /hpf (ref 0–2)
Renal Epithel, UA: NONE SEEN /hpf

## 2018-05-14 LAB — URINALYSIS, COMPLETE
Bilirubin, UA: NEGATIVE
Glucose, UA: NEGATIVE
Ketones, UA: NEGATIVE
Nitrite, UA: NEGATIVE
RBC, UA: NEGATIVE
Specific Gravity, UA: 1.02 (ref 1.005–1.030)
Urobilinogen, Ur: 0.2 mg/dL (ref 0.2–1.0)
pH, UA: 6.5 (ref 5.0–7.5)

## 2018-05-14 MED ORDER — OMEPRAZOLE 10 MG PO CPDR: 10.0000 mg | DELAYED_RELEASE_CAPSULE | Freq: Every day | ORAL | 3 refills | Status: DC

## 2018-05-14 MED ORDER — WARFARIN SODIUM 4 MG PO TABS: ORAL_TABLET | ORAL | 3 refills | Status: DC

## 2018-05-14 NOTE — Addendum Note (Signed)
Addended by: Zannie Cove on: 05/14/2018 11:57 AM   Modules accepted: Orders

## 2018-05-14 NOTE — Progress Notes (Signed)
Subjective:    Patient ID: Joshua [name], male    DOB: Aug 10, 1945, 73 y.o.   MRN: [id number]  HPI Patient is here today for annual wellness exam and follow up of chronic medical problems which includes a fib and hypertension. He is taking medication regularly.  The patient today complains of some right sided neck discomfort and generalized joint pain and arthralgias.  He is due to have a rectal exam and to return an FOBT.  He will get lab work today.  He is requesting refills on his omeprazole and Coumadin.  He will also get a urinalysis.  His vital signs are stable and he is followed regularly by the cardiologist.  The patient is doing well overall and is in good spirits.  He sees Dr. Claiborne Billings his [facility] cardiologist once yearly.  He sees the cardiologist at the [facility] 3 or 4 times yearly.  Because of a precancerous polyp he is due to get another colonoscopy in October 2021 by Dr. Fuller Plan.  Today he denies any chest pain or shortness of breath anymore than usual.  He denies any trouble with his stomach including nausea vomiting heartburn trouble swallowing change in bowel habits blood in the stool or black tarry bowel movements.  He is passing his water well.     Patient Active Problem List   Diagnosis Date Noted  . Adenomatous polyps 12/13/2016  . Long term (current) use of anticoagulants [Z79.01] 06/14/2016  . CAD in native artery 01/17/2016  . HOCM (hypertrophic obstructive cardiomyopathy) ([place]) 07/16/2015  . Hepatic cirrhosis ([place])   . Syncope and collapse 01/14/2015  . Lower GI bleeding 01/14/2015  . Elevated INR 01/14/2015  . Arterial hypotension   . Erectile dysfunction 11/10/2014  . Thrombocytopenia ([place]) 01/30/2014  . Hyperlipidemia LDL goal <70 12/03/2013  . High risk medication use 05/21/2013  . BPH (benign prostatic hyperplasia) 01/29/2013  . Family history of colon cancer 01/29/2013  . PAF (paroxysmal atrial fibrillation) ([place]) 12/17/2012  . Aortic valve insufficiency 12/17/2012   . Mild mitral stenosis 12/17/2012  . Pulmonary infiltrates 07/17/2010  . COPD UNSPECIFIED 05/05/2010  . Hypertrophic obstructive cardiomyopathy ([place]) 03/31/2010  . ATRIAL FLUTTER 03/31/2010  . B12 deficiency 10/11/2008  . ANEMIA, IRON DEFICIENCY 10/11/2008  . Coronary atherosclerosis 10/11/2008  . FIBRILLATION, ATRIAL 10/11/2008  . HIATAL HERNIA WITH REFLUX 10/11/2008  . COLONIC POLYPS, ADENOMATOUS, HX OF 10/11/2008   Outpatient Encounter Medications as of 05/14/2018  Medication Sig  . aspirin EC 81 MG tablet Take 81 mg by mouth daily.  . cholecalciferol (VITAMIN D) 1000 units tablet Take 1,000 Units by mouth daily.  . cyanocobalamin ([employer]S VITAMIN B12) 1000 MCG tablet Take 1 tablet (1,000 mcg total) by mouth daily.  . disopyramide (NORPACE) 150 MG capsule Take 2 capsules (300 mg total) by mouth 2 (two) times daily.  . hydrochlorothiazide (HYDRODIURIL) 25 MG tablet Take 0.5 tablets (12.5 mg total) by mouth as needed.  Marland Kitchen losartan (COZAAR) 50 MG tablet Take 50 mg by mouth daily.  Marland Kitchen omeprazole (PRILOSEC) 10 MG capsule Take 1 capsule (10 mg total) by mouth daily.  . rosuvastatin (CRESTOR) 10 MG tablet Take 1 tablet (10 mg total) by mouth daily.  Marland Kitchen warfarin (JANTOVEN) 4 MG tablet TAKE 1 TABLET BY MOUTH DAILY, EXCEPT ON TUES ANDFRIDAY TAKE 1 AND 1/2TAB  . acetaminophen (TYLENOL) 325 MG tablet Take 650 mg by mouth as needed.   . polyethylene glycol (MIRALAX / GLYCOLAX) packet Take 17 g by mouth every other day.  No facility-administered encounter medications on file as of 05/14/2018.      Review of Systems  Constitutional: Negative.   HENT: Negative.   Eyes: Negative.   Respiratory: Negative.   Cardiovascular: Negative.   Gastrointestinal: Negative.   Endocrine: Negative.   Genitourinary: Negative.   Musculoskeletal: Positive for arthralgias (right side neck and joint pain ).  Skin: Negative.   Allergic/Immunologic: Negative.   Neurological: Negative.   Hematological: Negative.    Psychiatric/Behavioral: Negative.        Objective:   Physical Exam Vitals signs and nursing note reviewed.  Constitutional:      General: He is not in acute distress.    Appearance: Normal appearance. He is well-developed and normal weight.     Comments: The patient is pleasant and doing well and trying to avoid crowds of people.  HENT:     Head: Normocephalic and atraumatic.     Right Ear: Tympanic membrane, ear canal and external ear normal. There is no impacted cerumen.     Left Ear: Tympanic membrane, ear canal and external ear normal. There is no impacted cerumen.     Nose: Nose normal. No congestion.     Mouth/Throat:     Mouth: Mucous membranes are moist.     Pharynx: Oropharynx is clear. No oropharyngeal exudate.  Eyes:     General: No scleral icterus.       Right eye: No discharge.        Left eye: No discharge.     Extraocular Movements: Extraocular movements intact.     Conjunctiva/sclera: Conjunctivae normal.     Pupils: Pupils are equal, round, and reactive to light.     Comments: Patient gets eye exams at the [place].  Neck:     Musculoskeletal: Normal range of motion and neck supple.     Thyroid: No thyromegaly.     Vascular: No carotid bruit.     Trachea: No tracheal deviation.     Comments: No bruits thyromegaly or anterior cervical adenopathy Cardiovascular:     Rate and Rhythm: Normal rate and regular rhythm.     Pulses: Normal pulses.     Heart sounds: Normal heart sounds. No murmur. No gallop.      Comments: The heart is regular today at 60/min with no pedal edema and actually very good pedal pulses. Pulmonary:     Effort: Pulmonary effort is normal. No respiratory distress.     Breath sounds: Rales present. No wheezing.     Comments: Few basilar rales on the right otherwise lungs are clear anteriorly and posteriorly.  Most likely atelectasis and scar tissue.  No axillary adenopathy Chest:     Chest wall: No tenderness.  Abdominal:     General: Abdomen  is flat. Bowel sounds are normal.     Palpations: Abdomen is soft. There is no mass.     Tenderness: There is no abdominal tenderness.     Comments: Abdomen is soft without liver or spleen enlargement epigastric tenderness masses or inguinal adenopathy with good inguinal pulses.  Genitourinary:    Penis: Normal.      Scrotum/Testes: Normal.     Rectum: Normal.     Comments: The prostate is enlarged with a left being slightly firmer than the right and somewhat irregular.  This is similar to previous exams.  We will make sure we check the PSA and that this is stable.  There is no inguinal hernias palpable and external genitalia were within normal limits.  Musculoskeletal: Normal range of motion.        General: No tenderness.     Right lower leg: No edema.     Left lower leg: No edema.  Lymphadenopathy:     Cervical: No cervical adenopathy.  Skin:    General: Skin is warm and dry.     Findings: Bruising present. No rash.     Comments: No rashes increased petechiae both arms.  Neurological:     General: No focal deficit present.     Mental Status: He is alert and oriented to person, place, and time. Mental status is at baseline.     Cranial Nerves: No cranial nerve deficit.     Gait: Gait normal.     Deep Tendon Reflexes: Reflexes are normal and symmetric. Reflexes normal.  Psychiatric:        Mood and Affect: Mood normal.        Behavior: Behavior normal.        Thought Content: Thought content normal.        Judgment: Judgment normal.     Comments: Mood affect and behavior for this patient are all normal for him.     BP (!) 115/47 (BP Location: Left Arm)   Pulse (!) 50   Temp (!) 96.8 F (36 C) (Oral)   Ht 5' 6.5" (1.689 m)   Wt 186 lb (84.4 kg)   BMI 29.57 kg/m        Assessment & Plan:  1. Annual physical exam -Lab work is pending and patient will continue to follow-up with cardiology as planned and will get another colonoscopy as planned in 2021.  2. PAF (paroxysmal  atrial fibrillation) (HCC) -Earlier today at 60/min - CBC with Differential/Platelet  3. Vitamin D deficiency - CBC with Differential/Platelet - VITAMIN D 25 Hydroxy (Vit-D Deficiency, Fractures)  4. Essential hypertension - BMP8+EGFR - CBC with Differential/Platelet - Hepatic function panel  5. Hyperlipidemia, unspecified hyperlipidemia type -Continue current treatment as aggressive therapeutic lifestyle changes as possible - CBC with Differential/Platelet - Lipid panel  6. Thrombocytopenia ([place]) -Patient does have some bruising today on both arms. - CBC with Differential/Platelet  7. Benign prostatic hyperplasia without lower urinary tract symptoms -The prostate is enlarged and irregular on the patient's left side and smoother on the right side this is been the case in the past and we will make sure that his PSA is stable. - PSA, total and free - Urinalysis, Complete  8. Chronic atrial fibrillation -The heart was regular today at 60/min with good pedal pulses.  9. Senile purpura ([place]) -This continues on both arms as in the past.  10. Hepatic cirrhosis, unspecified hepatic cirrhosis type ([place]) -Check liver function test  11. Hypertrophic obstructive cardiomyopathy ([place]) -Follow-up with cardiology as planned including Dr. Claiborne Billings and the cardiologist at the [facility]  12. Primary osteoarthritis involving multiple joints -Take Tylenol for joint pain and use warm wet compresses  13. Personal history of colonic polyps -Get colonoscopy as planned in 2021  Meds ordered this encounter  Medications  . warfarin (JANTOVEN) 4 MG tablet    Sig: TAKE 1 TABLET BY MOUTH DAILY, EXCEPT ON TUES ANDFRIDAY TAKE 1 AND 1/2TAB    Dispense:  115 tablet    Refill:  3  . omeprazole (PRILOSEC) 10 MG capsule    Sig: Take 1 capsule (10 mg total) by mouth daily.    Dispense:  90 capsule    Refill:  3   Patient Instructions  Medicare Annual Wellness Visit  [place] and  the medical providers at [facility] strive to bring you the best medical care.  In doing so we not only want to address your current medical conditions and concerns but also to detect new conditions early and prevent illness, disease and health-related problems.    Medicare offers a yearly Wellness Visit which allows our clinical staff to assess your need for preventative services including immunizations, lifestyle education, counseling to decrease risk of preventable diseases and screening for fall risk and other medical concerns.    This visit is provided free of charge (no copay) for all Medicare recipients. The clinical pharmacists at [facility] have begun to conduct these Wellness Visits which will also include a thorough review of all your medications.    As you primary medical provider recommend that you make an appointment for your Annual Wellness Visit if you have not done so already this year.  You may set up this appointment before you leave today or you may call back ([phone]) and schedule an appointment.  Please make sure when you call that you mention that you are scheduling your Annual Wellness Visit with the clinical pharmacist so that the appointment may be made for the proper length of time.     Continue current medications. Continue good therapeutic lifestyle changes which include good diet and exercise. Fall precautions discussed with patient. If an FOBT was given today- please return it to our front desk. If you are over 58 years old - you may need Prevnar 80 or the adult Pneumonia vaccine.  **Flu shots are available--- please call and schedule a FLU-CLINIC appointment**  After your visit with [place] today you will receive a survey in the mail or online from [employer] regarding your care with [place]. Please take a moment to fill this out. Your feedback is very important to [place] as you can help [place] better understand your patient needs as  well as improve your experience and satisfaction. WE CARE ABOUT YOU!!!   Continue follow-up with cardiology and with the [place] [facility]. Continue current medications Continue to make every effort to watch sodium intake exercise regularly and drink plenty of water. Practice good hand and respiratory hygiene Follow healthy standards of care because of this coronavirus epidemic.  Arrie Senate MD

## 2018-05-14 NOTE — Patient Instructions (Addendum)
Medicare Annual Wellness Visit  [facility] and the medical providers at [facility] strive to bring you the best medical care.  In doing so we not only want to address your current medical conditions and concerns but also to detect new conditions early and prevent illness, disease and health-related problems.    Medicare offers a yearly Wellness Visit which allows our clinical staff to assess your need for preventative services including immunizations, lifestyle education, counseling to decrease risk of preventable diseases and screening for fall risk and other medical concerns.    This visit is provided free of charge (no copay) for all Medicare recipients. The clinical pharmacists at [facility] have begun to conduct these Wellness Visits which will also include a thorough review of all your medications.    As you primary medical provider recommend that you make an appointment for your Annual Wellness Visit if you have not done so already this year.  You may set up this appointment before you leave today or you may call back ([phone]) and schedule an appointment.  Please make sure when you call that you mention that you are scheduling your Annual Wellness Visit with the clinical pharmacist so that the appointment may be made for the proper length of time.     Continue current medications. Continue good therapeutic lifestyle changes which include good diet and exercise. Fall precautions discussed with patient. If an FOBT was given today- please return it to our front desk. If you are over 17 years old - you may need Prevnar 72 or the adult Pneumonia vaccine.  **Flu shots are available--- please call and schedule a FLU-CLINIC appointment**  After your visit with [place] today you will receive a survey in the mail or online from [employer] regarding your care with [place]. Please take a moment to fill this out. Your feedback is very  important to [place] as you can help [place] better understand your patient needs as well as improve your experience and satisfaction. WE CARE ABOUT YOU!!!   Continue follow-up with cardiology and with the [facility]. Continue current medications Continue to make every effort to watch sodium intake exercise regularly and drink plenty of water. Practice good hand and respiratory hygiene Follow healthy standards of care because of this coronavirus epidemic.

## 2018-05-15 ENCOUNTER — Telehealth: Payer: Self-pay | Admitting: Family Medicine

## 2018-05-15 ENCOUNTER — Other Ambulatory Visit: Payer: Medicare Other

## 2018-05-15 DIAGNOSIS — Z1211 Encounter for screening for malignant neoplasm of colon: Secondary | ICD-10-CM

## 2018-05-15 LAB — BMP8+EGFR
BUN/Creatinine Ratio: 18 (ref 10–24)
BUN: 21 mg/dL (ref 8–27)
CO2: 29 mmol/L (ref 20–29)
Calcium: 9.1 mg/dL (ref 8.6–10.2)
Chloride: 96 mmol/L (ref 96–106)
Creatinine, Ser: 1.2 mg/dL (ref 0.76–1.27)
GFR calc Af Amer: 69 mL/min/{1.73_m2} (ref >59)
GFR calc non Af Amer: 60 mL/min/{1.73_m2} (ref >59)
Glucose: 64 mg/dL — ABNORMAL LOW (ref 65–99)
Potassium: 3.9 mmol/L (ref 3.5–5.2)
Sodium: 139 mmol/L (ref 134–144)

## 2018-05-15 LAB — CBC WITH DIFFERENTIAL/PLATELET
Basophils Absolute: 0.1 10*3/uL (ref 0.0–0.2)
Basos: 1 %
EOS (ABSOLUTE): 0.2 10*3/uL (ref 0.0–0.4)
Eos: 4 %
Hematocrit: 49.1 % (ref 37.5–51.0)
Hemoglobin: 16.4 g/dL (ref 13.0–17.7)
Immature Grans (Abs): 0 10*3/uL (ref 0.0–0.1)
Immature Granulocytes: 0 %
Lymphocytes Absolute: 1.4 10*3/uL (ref 0.7–3.1)
Lymphs: 23 %
MCH: 31.3 pg (ref 26.6–33.0)
MCHC: 33.4 g/dL (ref 31.5–35.7)
MCV: 94 fL (ref 79–97)
Monocytes Absolute: 0.5 10*3/uL (ref 0.1–0.9)
Monocytes: 8 %
Neutrophils Absolute: 4.1 10*3/uL (ref 1.4–7.0)
Neutrophils: 64 %
Platelets: 147 10*3/uL — ABNORMAL LOW (ref 150–450)
RBC: 5.24 x10E6/uL (ref 4.14–5.80)
RDW: 12.3 % (ref 11.6–15.4)
WBC: 6.3 10*3/uL (ref 3.4–10.8)

## 2018-05-15 LAB — LIPID PANEL
Chol/HDL Ratio: 3.1 ratio (ref 0.0–5.0)
Cholesterol, Total: 156 mg/dL (ref 100–199)
HDL: 51 mg/dL (ref >39)
LDL Calculated: 82 mg/dL (ref 0–99)
Triglycerides: 114 mg/dL (ref 0–149)
VLDL Cholesterol Cal: 23 mg/dL (ref 5–40)

## 2018-05-15 LAB — PSA, TOTAL AND FREE
PSA, Free Pct: 50 %
PSA, Free: 0.2 ng/mL
Prostate Specific Ag, Serum: 0.4 ng/mL (ref 0.0–4.0)

## 2018-05-15 LAB — HEPATIC FUNCTION PANEL
ALT: 20 IU/L (ref 0–44)
AST: 25 IU/L (ref 0–40)
Albumin: 4.1 g/dL (ref 3.7–4.7)
Alkaline Phosphatase: 70 IU/L (ref 39–117)
Bilirubin Total: 0.8 mg/dL (ref 0.0–1.2)
Bilirubin, Direct: 0.19 mg/dL (ref 0.00–0.40)
Total Protein: 7.2 g/dL (ref 6.0–8.5)

## 2018-05-15 LAB — VITAMIN D 25 HYDROXY (VIT D DEFICIENCY, FRACTURES): Vit D, 25-Hydroxy: 38 ng/mL (ref 30.0–100.0)

## 2018-05-16 LAB — FECAL OCCULT BLOOD, IMMUNOCHEMICAL: Fecal Occult Bld: NEGATIVE

## 2018-05-16 NOTE — Telephone Encounter (Signed)
Patient aware of results.

## 2018-06-11 ENCOUNTER — Other Ambulatory Visit: Payer: Self-pay

## 2018-06-11 ENCOUNTER — Ambulatory Visit: Payer: Medicare Other | Admitting: Pharmacist Clinician (PhC)/ Clinical Pharmacy Specialist

## 2018-06-11 ENCOUNTER — Encounter: Payer: Self-pay | Admitting: Pharmacist Clinician (PhC)/ Clinical Pharmacy Specialist

## 2018-06-11 DIAGNOSIS — I480 Paroxysmal atrial fibrillation: Secondary | ICD-10-CM

## 2018-06-11 DIAGNOSIS — I4891 Unspecified atrial fibrillation: Secondary | ICD-10-CM

## 2018-06-11 DIAGNOSIS — I482 Chronic atrial fibrillation, unspecified: Secondary | ICD-10-CM

## 2018-06-11 DIAGNOSIS — Z7901 Long term (current) use of anticoagulants: Secondary | ICD-10-CM

## 2018-06-11 LAB — COAGUCHEK XS/INR WAIVED
INR: 2.2 — ABNORMAL HIGH (ref 0.9–1.1)
Prothrombin Time: 26.4 s

## 2018-06-11 NOTE — Patient Instructions (Signed)
Description   Continue taking 1 1/2 tablets on Mondays, Thursdays, and 1 tablet all other days of the week.  INR today is 2.2 today ( goal is 2.0-3.0)  Perfect reading today!

## 2018-07-22 ENCOUNTER — Other Ambulatory Visit: Payer: Self-pay

## 2018-07-23 ENCOUNTER — Ambulatory Visit (INDEPENDENT_AMBULATORY_CARE_PROVIDER_SITE_OTHER): Payer: Medicare Other | Admitting: Pharmacist Clinician (PhC)/ Clinical Pharmacy Specialist

## 2018-07-23 ENCOUNTER — Encounter: Payer: Self-pay | Admitting: Pharmacist Clinician (PhC)/ Clinical Pharmacy Specialist

## 2018-07-23 DIAGNOSIS — I480 Paroxysmal atrial fibrillation: Secondary | ICD-10-CM | POA: Diagnosis not present

## 2018-07-23 DIAGNOSIS — I482 Chronic atrial fibrillation, unspecified: Secondary | ICD-10-CM | POA: Diagnosis not present

## 2018-07-23 DIAGNOSIS — I4891 Unspecified atrial fibrillation: Secondary | ICD-10-CM

## 2018-07-23 DIAGNOSIS — Z7901 Long term (current) use of anticoagulants: Secondary | ICD-10-CM | POA: Diagnosis not present

## 2018-07-23 LAB — COAGUCHEK XS/INR WAIVED
INR: 1.8 — ABNORMAL HIGH (ref 0.9–1.1)
Prothrombin Time: 21 s

## 2018-07-23 NOTE — Patient Instructions (Signed)
Description   Continue taking 1 1/2 tablets on Mondays, Thursdays, and 1 tablet all other days of the week. Take 6mg  on Friday of this week, otherwise continue taking warfarin the same way.  INR today is 1.8 today ( goal is 2.0-3.0)  A little thick today but ok since dental surgery is tomorrow

## 2018-07-28 ENCOUNTER — Other Ambulatory Visit: Payer: Self-pay

## 2018-07-29 ENCOUNTER — Ambulatory Visit (INDEPENDENT_AMBULATORY_CARE_PROVIDER_SITE_OTHER): Payer: Medicare Other | Admitting: *Deleted

## 2018-07-29 DIAGNOSIS — Z23 Encounter for immunization: Secondary | ICD-10-CM

## 2018-07-29 NOTE — Progress Notes (Signed)
Pt given 2nd Shingrix vaccine Tolerated well 

## 2018-08-26 ENCOUNTER — Other Ambulatory Visit: Payer: Self-pay

## 2018-08-27 ENCOUNTER — Ambulatory Visit (INDEPENDENT_AMBULATORY_CARE_PROVIDER_SITE_OTHER): Payer: Medicare Other | Admitting: Pharmacist Clinician (PhC)/ Clinical Pharmacy Specialist

## 2018-08-27 ENCOUNTER — Encounter: Payer: Self-pay | Admitting: Pharmacist Clinician (PhC)/ Clinical Pharmacy Specialist

## 2018-08-27 DIAGNOSIS — I480 Paroxysmal atrial fibrillation: Secondary | ICD-10-CM

## 2018-08-27 DIAGNOSIS — Z7901 Long term (current) use of anticoagulants: Secondary | ICD-10-CM | POA: Diagnosis not present

## 2018-08-27 DIAGNOSIS — I4891 Unspecified atrial fibrillation: Secondary | ICD-10-CM

## 2018-08-27 DIAGNOSIS — I482 Chronic atrial fibrillation, unspecified: Secondary | ICD-10-CM

## 2018-08-27 LAB — COAGUCHEK XS/INR WAIVED
INR: 2 — ABNORMAL HIGH (ref 0.9–1.1)
Prothrombin Time: 24 s

## 2018-08-27 NOTE — Patient Instructions (Signed)
Description   Continue taking 1 1/2 tablets on Mondays, Thursdays, and 1 tablet all other days of the week.   INR today is 2.0 today ( goal is 2.0-3.0)  Perfect reading today

## 2018-09-12 ENCOUNTER — Encounter: Payer: Self-pay | Admitting: *Deleted

## 2018-10-10 ENCOUNTER — Other Ambulatory Visit: Payer: Self-pay

## 2018-10-13 ENCOUNTER — Encounter: Payer: Self-pay | Admitting: Family Medicine

## 2018-10-13 ENCOUNTER — Ambulatory Visit (INDEPENDENT_AMBULATORY_CARE_PROVIDER_SITE_OTHER): Payer: Medicare Other | Admitting: Family Medicine

## 2018-10-13 ENCOUNTER — Other Ambulatory Visit: Payer: Self-pay

## 2018-10-13 VITALS — BP 135/57 | HR 50 | Temp 96.4°F | Ht 66.5 in | Wt 182.8 lb

## 2018-10-13 DIAGNOSIS — I480 Paroxysmal atrial fibrillation: Secondary | ICD-10-CM | POA: Diagnosis not present

## 2018-10-13 DIAGNOSIS — I482 Chronic atrial fibrillation, unspecified: Secondary | ICD-10-CM

## 2018-10-13 DIAGNOSIS — Z7901 Long term (current) use of anticoagulants: Secondary | ICD-10-CM

## 2018-10-13 LAB — COAGUCHEK XS/INR WAIVED
INR: 2.4 — ABNORMAL HIGH (ref 0.9–1.1)
Prothrombin Time: 29.2 s

## 2018-10-13 NOTE — Progress Notes (Signed)
BP (!) 135/57   Pulse (!) 50   Temp (!) 96.4 F (35.8 C) (Temporal)   Ht 5' 6.5" (1.689 m)   Wt 182 lb 12.8 oz (82.9 kg)   BMI 29.06 kg/m    Subjective:   Patient ID: Joshua [name], male    DOB: 06-25-1945, 73 y.o.   MRN: [id number]  HPI: GRAEDEN BITNER is a 73 y.o. male presenting on 10/13/2018 for Coagulation Disorder   HPI Patient is coming in for clotting disorder Patient is coming in for recheck of Coumadin and anticoagulation.  He denies any bruising or bleeding or palpitations or flutters.  He currently is on it for A. fib that is paroxysmal.  Relevant past medical, surgical, family and social history reviewed and updated as indicated. Interim medical history since our last visit reviewed. Allergies and medications reviewed and updated.  Review of Systems  Constitutional: Negative for chills and fever.  Respiratory: Negative for shortness of breath and wheezing.   Cardiovascular: Negative for chest pain, palpitations and leg swelling.  Gastrointestinal: Negative for blood in stool.  Genitourinary: Negative for hematuria.  Musculoskeletal: Negative for back pain and gait problem.  Skin: Negative for rash.  Neurological: Negative for dizziness, weakness and light-headedness.  All other systems reviewed and are negative.   Per HPI unless specifically indicated above   Allergies as of 10/13/2018      Reactions   Tetracycline Other (See Comments)   Issue with stomach lining - can take with food if needed      Medication List       Accurate as of October 13, 2018 11:18 AM. If you have any questions, ask your nurse or doctor.        acetaminophen 325 MG tablet Commonly known as: TYLENOL Take 650 mg by mouth as needed.   aspirin EC 81 MG tablet Take 81 mg by mouth daily.   cholecalciferol 1000 units tablet Commonly known as: VITAMIN D Take 1,000 Units by mouth daily.   cyanocobalamin 1000 MCG tablet Commonly known as: CVS VITAMIN B12 Take 1 tablet (1,000  mcg total) by mouth daily.   disopyramide 150 MG capsule Commonly known as: NORPACE Take 2 capsules (300 mg total) by mouth 2 (two) times daily.   hydrochlorothiazide 25 MG tablet Commonly known as: HYDRODIURIL Take 0.5 tablets (12.5 mg total) by mouth as needed.   losartan 50 MG tablet Commonly known as: COZAAR Take 50 mg by mouth daily.   omeprazole 10 MG capsule Commonly known as: PRILOSEC Take 1 capsule (10 mg total) by mouth daily.   polyethylene glycol 17 g packet Commonly known as: MIRALAX / GLYCOLAX Take 17 g by mouth every other day.   rosuvastatin 10 MG tablet Commonly known as: CRESTOR Take 1 tablet (10 mg total) by mouth daily.   warfarin 4 MG tablet Commonly known as: Jantoven Take as directed by the anticoagulation clinic. If you are unsure how to take this medication, talk to your nurse or doctor. Original instructions: TAKE 1 TABLET BY MOUTH DAILY, EXCEPT ON TUES ANDFRIDAY TAKE 1 AND 1/2TAB        Objective:   BP (!) 135/57   Pulse (!) 50   Temp (!) 96.4 F (35.8 C) (Temporal)   Ht 5' 6.5" (1.689 m)   Wt 182 lb 12.8 oz (82.9 kg)   BMI 29.06 kg/m   Wt Readings from Last 3 Encounters:  10/13/18 182 lb 12.8 oz (82.9 kg)  05/14/18 186 lb (84.4  kg)  01/14/18 188 lb (85.3 kg)    Physical Exam Vitals signs and nursing note reviewed.  Constitutional:      General: He is not in acute distress.    Appearance: He is well-developed. He is not diaphoretic.  Eyes:     General: No scleral icterus.    Conjunctiva/sclera: Conjunctivae normal.  Neck:     Thyroid: No thyromegaly.  Cardiovascular:     Rate and Rhythm: Normal rate. Rhythm irregular.     Heart sounds: Normal heart sounds.  Pulmonary:     Effort: Pulmonary effort is normal. No respiratory distress.     Breath sounds: Normal breath sounds. No wheezing.  Skin:    Findings: Bruising (Small amount of bruising but nothing major on his arms mostly) present.  Neurological:     Mental Status: He  is alert and oriented to person, place, and time.     Coordination: Coordination normal.  Psychiatric:        Behavior: Behavior normal.     Description   Continue taking 1 1/2 tablets on Mondays, Thursdays, and 1 tablet all other days of the week.   INR today is 2.4 today ( goal is 2.0-3.0)  Perfect reading today Return in 6-8 weeks      Assessment & Plan:   Problem List Items Addressed This Visit      Cardiovascular and Mediastinum   FIBRILLATION, ATRIAL   PAF (paroxysmal atrial fibrillation) ([place])     Other   Long term (current) use of anticoagulants [Z79.01] - Primary   Relevant Orders   CoaguChek XS/INR Waived (Completed)       Follow up plan: Return if symptoms worsen or fail to improve, for 6-8 week inr recheck.  Counseling provided for all of the vaccine components Orders Placed This Encounter  Procedures  . CoaguChek XS/INR [place], MD [facility] 10/13/2018, 11:18 AM

## 2018-10-24 ENCOUNTER — Other Ambulatory Visit: Payer: Self-pay

## 2018-10-27 ENCOUNTER — Ambulatory Visit (INDEPENDENT_AMBULATORY_CARE_PROVIDER_SITE_OTHER): Payer: Medicare Other | Admitting: *Deleted

## 2018-10-27 ENCOUNTER — Other Ambulatory Visit: Payer: Self-pay

## 2018-10-27 DIAGNOSIS — Z23 Encounter for immunization: Secondary | ICD-10-CM | POA: Diagnosis not present

## 2018-11-13 ENCOUNTER — Ambulatory Visit: Payer: Medicare Other | Admitting: Family Medicine

## 2018-11-24 ENCOUNTER — Other Ambulatory Visit: Payer: Self-pay

## 2018-11-24 ENCOUNTER — Ambulatory Visit (INDEPENDENT_AMBULATORY_CARE_PROVIDER_SITE_OTHER): Payer: Medicare Other | Admitting: Cardiovascular Disease

## 2018-11-24 VITALS — BP 132/60 | HR 60 | Ht 69.0 in | Wt 179.0 lb

## 2018-11-24 DIAGNOSIS — I480 Paroxysmal atrial fibrillation: Secondary | ICD-10-CM | POA: Diagnosis not present

## 2018-11-24 DIAGNOSIS — I351 Nonrheumatic aortic (valve) insufficiency: Secondary | ICD-10-CM

## 2018-11-24 DIAGNOSIS — I2510 Atherosclerotic heart disease of native coronary artery without angina pectoris: Secondary | ICD-10-CM

## 2018-11-24 DIAGNOSIS — I421 Obstructive hypertrophic cardiomyopathy: Secondary | ICD-10-CM | POA: Diagnosis not present

## 2018-11-24 DIAGNOSIS — I872 Venous insufficiency (chronic) (peripheral): Secondary | ICD-10-CM

## 2018-11-24 DIAGNOSIS — I5189 Other ill-defined heart diseases: Secondary | ICD-10-CM

## 2018-11-24 DIAGNOSIS — I519 Heart disease, unspecified: Secondary | ICD-10-CM

## 2018-11-24 NOTE — Patient Instructions (Signed)
Medication Instructions:  The current medical regimen is effective;  continue present plan and medications.  If you need a refill on your cardiac medications before your next appointment, please call your pharmacy.   Testing/Procedures: Echocardiogram (in April) - Your physician has requested that you have an echocardiogram. Echocardiography is a painless test that uses sound waves to create images of your heart. It provides your doctor with information about the size and shape of your heart and how well your heart's chambers and valves are working. This procedure takes approximately one hour. There are no restrictions for this procedure. This will be performed at our [facility] location - [address].  Follow-Up: At [facility], you and your health needs are our priority.  As part of our continuing mission to provide you with exceptional heart care, we have created designated Provider Care Teams.  These Care Teams include your primary Cardiologist (physician) and Advanced Practice Providers (APPs -  Physician Assistants and Nurse Practitioners) who all work together to provide you with the care you need, when you need it. You will need a follow up appointment in 8 months.  Please call our office 2 months in advance to schedule this appointment.  You may see Shelva Majestic, MD or one of the following Advanced Practice Providers on your designated Care Team: [place], [place] . Fabian Sharp, PA-C
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
## 2018-11-25 ENCOUNTER — Encounter: Payer: Self-pay | Admitting: Cardiovascular Disease

## 2018-11-26 ENCOUNTER — Other Ambulatory Visit (INDEPENDENT_AMBULATORY_CARE_PROVIDER_SITE_OTHER): Payer: Medicare Other

## 2018-11-26 DIAGNOSIS — I2510 Atherosclerotic heart disease of native coronary artery without angina pectoris: Secondary | ICD-10-CM | POA: Diagnosis not present

## 2018-11-26 DIAGNOSIS — I4891 Unspecified atrial fibrillation: Secondary | ICD-10-CM | POA: Diagnosis not present

## 2018-11-26 DIAGNOSIS — I351 Nonrheumatic aortic (valve) insufficiency: Secondary | ICD-10-CM

## 2018-11-26 DIAGNOSIS — I4892 Unspecified atrial flutter: Secondary | ICD-10-CM

## 2018-11-26 DIAGNOSIS — I480 Paroxysmal atrial fibrillation: Secondary | ICD-10-CM

## 2018-11-26 DIAGNOSIS — I421 Obstructive hypertrophic cardiomyopathy: Secondary | ICD-10-CM

## 2018-12-04 ENCOUNTER — Other Ambulatory Visit: Payer: Self-pay

## 2018-12-05 ENCOUNTER — Ambulatory Visit (INDEPENDENT_AMBULATORY_CARE_PROVIDER_SITE_OTHER): Payer: Medicare Other | Admitting: Family Medicine

## 2018-12-05 ENCOUNTER — Encounter: Payer: Self-pay | Admitting: Family Medicine

## 2018-12-05 DIAGNOSIS — I480 Paroxysmal atrial fibrillation: Secondary | ICD-10-CM | POA: Diagnosis not present

## 2018-12-05 DIAGNOSIS — Z7901 Long term (current) use of anticoagulants: Secondary | ICD-10-CM | POA: Diagnosis not present

## 2018-12-05 LAB — COAGUCHEK XS/INR WAIVED
INR: 2 — ABNORMAL HIGH (ref 0.9–1.1)
Prothrombin Time: 24.4 s

## 2018-12-05 NOTE — Addendum Note (Signed)
Addended by: Nigel Berthold C on: 12/05/2018 10:22 AM   Modules accepted: Orders

## 2018-12-05 NOTE — Addendum Note (Signed)
Addended by: Nigel Berthold C on: 12/05/2018 10:07 AM   Modules accepted: Orders

## 2018-12-05 NOTE — Progress Notes (Signed)
BP (!) 125/53   Pulse (!) 54   Temp (!) 96.8 F (36 C) (Temporal)   Ht 5\' 9"  (1.753 m)   Wt 179 lb 3.2 oz (81.3 kg)   SpO2 92%   BMI 26.46 kg/m    Subjective:   Patient ID: Joshua [name], male    DOB: 03-11-1945, 73 y.o.   MRN: [id number]  HPI: Joshua [name] is a 73 y.o. male presenting on 12/05/2018 for Coagulation Disorder   HPI Patient is coming in for recheck of A. fib and anticoagulation.  He denies any major bruising or bleeding.  He does get bruising but says that it is mostly under control, he just saw his cardiologist.  He denies any palpitations or flutters.  Relevant past medical, surgical, family and social history reviewed and updated as indicated. Interim medical history since our last visit reviewed. Allergies and medications reviewed and updated.  Review of Systems  Constitutional: Negative for chills and fever.  Respiratory: Negative for shortness of breath and wheezing.   Cardiovascular: Negative for chest pain and leg swelling.  Musculoskeletal: Negative for back pain and gait problem.  Skin: Negative for rash.  Neurological: Negative for dizziness and weakness.  All other systems reviewed and are negative.   Per HPI unless specifically indicated above   Allergies as of 12/05/2018      Reactions   Tetracycline Other (See Comments)   Issue with stomach lining - can take with food if needed      Medication List       Accurate as of December 05, 2018  8:28 AM. If you have any questions, ask your nurse or doctor.        acetaminophen 325 MG tablet Commonly known as: TYLENOL Take 650 mg by mouth as needed.   aspirin EC 81 MG tablet Take 81 mg by mouth daily.   cholecalciferol 1000 units tablet Commonly known as: VITAMIN D Take 1,000 Units by mouth daily.   cyanocobalamin 1000 MCG tablet Commonly known as: CVS VITAMIN B12 Take 1 tablet (1,000 mcg total) by mouth daily.   disopyramide 150 MG capsule Commonly known as: NORPACE Take 2 capsules  (300 mg total) by mouth 2 (two) times daily.   hydrochlorothiazide 25 MG tablet Commonly known as: HYDRODIURIL Take 0.5 tablets (12.5 mg total) by mouth as needed.   losartan 50 MG tablet Commonly known as: COZAAR Take 50 mg by mouth daily.   omeprazole 10 MG capsule Commonly known as: PRILOSEC Take 1 capsule (10 mg total) by mouth daily.   polyethylene glycol 17 g packet Commonly known as: MIRALAX / GLYCOLAX Take 17 g by mouth every other day.   rosuvastatin 10 MG tablet Commonly known as: CRESTOR Take 1 tablet (10 mg total) by mouth daily.   warfarin 4 MG tablet Commonly known as: Jantoven Take as directed by the anticoagulation clinic. If you are unsure how to take this medication, talk to your nurse or doctor. Original instructions: TAKE 1 TABLET BY MOUTH DAILY, EXCEPT ON TUES ANDFRIDAY TAKE 1 AND 1/2TAB        Objective:   BP (!) 125/53   Pulse (!) 54   Temp (!) 96.8 F (36 C) (Temporal)   Ht 5\' 9"  (1.753 m)   Wt 179 lb 3.2 oz (81.3 kg)   SpO2 92%   BMI 26.46 kg/m   Wt Readings from Last 3 Encounters:  12/05/18 179 lb 3.2 oz (81.3 kg)  11/24/18 179 lb (81.2 kg)  10/13/18 182 lb 12.8 oz (82.9 kg)    Physical Exam Vitals signs and nursing note reviewed.  Constitutional:      General: He is not in acute distress.    Appearance: He is well-developed. He is not diaphoretic.  Eyes:     General: No scleral icterus.    Conjunctiva/sclera: Conjunctivae normal.  Neck:     Thyroid: No thyromegaly.  Cardiovascular:     Rate and Rhythm: Normal rate and regular rhythm.     Heart sounds: Murmur (2 out of 6 holosystolic murmur at left second intercostal) present.  Pulmonary:     Effort: Pulmonary effort is normal. No respiratory distress.     Breath sounds: Normal breath sounds. No wheezing.  Musculoskeletal: Normal range of motion.  Skin:    General: Skin is warm and dry.     Findings: No rash.  Neurological:     Mental Status: He is alert and oriented to  person, place, and time.     Coordination: Coordination normal.  Psychiatric:        Behavior: Behavior normal.     Description   Continue taking 1 1/2 tablets on Mondays, Thursdays, and 1 tablet all other days of the week.   INR today is 2.0 today ( goal is 2.0-3.0)  Perfect reading today Return in 6-8 weeks      Assessment & Plan:   Problem List Items Addressed This Visit      Cardiovascular and Mediastinum   FIBRILLATION, ATRIAL   PAF (paroxysmal atrial fibrillation) ([place])     Other   Long term (current) use of anticoagulants [Z79.01]      Follow-up with anticoagulation, keep his dose the same for now. Follow up plan: Return in about 6 weeks (around 01/16/2019), or if symptoms worsen or fail to improve.  Counseling provided for all of the vaccine components No orders of the defined types were placed in this encounter.   Caryl Pina, MD [facility] 12/05/2018, 8:28 AM

## 2019-01-15 ENCOUNTER — Other Ambulatory Visit: Payer: Self-pay

## 2019-01-16 ENCOUNTER — Ambulatory Visit (INDEPENDENT_AMBULATORY_CARE_PROVIDER_SITE_OTHER): Payer: Medicare Other | Admitting: Family Medicine

## 2019-01-16 ENCOUNTER — Encounter: Payer: Self-pay | Admitting: Family Medicine

## 2019-01-16 VITALS — BP 140/60 | HR 59 | Temp 96.6°F | Ht 69.0 in | Wt 178.4 lb

## 2019-01-16 DIAGNOSIS — I482 Chronic atrial fibrillation, unspecified: Secondary | ICD-10-CM | POA: Diagnosis not present

## 2019-01-16 DIAGNOSIS — Z7901 Long term (current) use of anticoagulants: Secondary | ICD-10-CM | POA: Diagnosis not present

## 2019-01-16 DIAGNOSIS — I480 Paroxysmal atrial fibrillation: Secondary | ICD-10-CM

## 2019-01-16 LAB — COAGUCHEK XS/INR WAIVED
INR: 2.4 — ABNORMAL HIGH (ref 0.9–1.1)
Prothrombin Time: 28.2 s

## 2019-01-16 NOTE — Progress Notes (Signed)
BP 140/60   Pulse (!) 59   Temp (!) 96.6 F (35.9 C) (Temporal)   Ht 5\' 9"  (1.753 m)   Wt 178 lb 6.4 oz (80.9 kg)   SpO2 92%   BMI 26.35 kg/m    Subjective:   Patient ID: Joshua [name], male    DOB: 20-Jan-1946, 73 y.o.   MRN: [id number]  HPI: Joshua [name] is a 73 y.o. male presenting on 01/16/2019 for Coagulation Disorder   HPI Patient is coming in for Coumadin recheck and anticoagulation today.  He says that he denies any major bruising or bleeding more than normal.  He does get bruises easily on his arms when he is working but other not he seems to be doing okay with it.  He denies any chest pain or palpitations.  Patient has a history of A. fib and that is why is on anticoagulation, target goal 2-3  Relevant past medical, surgical, family and social history reviewed and updated as indicated. Interim medical history since our last visit reviewed. Allergies and medications reviewed and updated.  Review of Systems  Constitutional: Negative for chills and fever.  Eyes: Negative for visual disturbance.  Respiratory: Negative for shortness of breath and wheezing.   Cardiovascular: Negative for chest pain and leg swelling.  Musculoskeletal: Negative for back pain and gait problem.  Skin: Negative for rash.  Neurological: Negative for dizziness, weakness and light-headedness.  All other systems reviewed and are negative.   Per HPI unless specifically indicated above   Allergies as of 01/16/2019      Reactions   Tetracycline Other (See Comments)   Issue with stomach lining - can take with food if needed      Medication List       Accurate as of January 16, 2019  9:25 AM. If you have any questions, ask your nurse or doctor.        acetaminophen 325 MG tablet Commonly known as: TYLENOL Take 650 mg by mouth as needed.   aspirin EC 81 MG tablet Take 81 mg by mouth daily.   cholecalciferol 1000 units tablet Commonly known as: VITAMIN D Take 1,000 Units by mouth  daily.   cyanocobalamin 1000 MCG tablet Commonly known as: CVS VITAMIN B12 Take 1 tablet (1,000 mcg total) by mouth daily.   disopyramide 150 MG capsule Commonly known as: NORPACE Take 2 capsules (300 mg total) by mouth 2 (two) times daily.   hydrochlorothiazide 25 MG tablet Commonly known as: HYDRODIURIL Take 0.5 tablets (12.5 mg total) by mouth as needed.   losartan 50 MG tablet Commonly known as: COZAAR Take 50 mg by mouth daily.   omeprazole 10 MG capsule Commonly known as: PRILOSEC Take 1 capsule (10 mg total) by mouth daily.   polyethylene glycol 17 g packet Commonly known as: MIRALAX / GLYCOLAX Take 17 g by mouth every other day.   rosuvastatin 10 MG tablet Commonly known as: CRESTOR Take 1 tablet (10 mg total) by mouth daily.   warfarin 4 MG tablet Commonly known as: Jantoven Take as directed by the anticoagulation clinic. If you are unsure how to take this medication, talk to your nurse or doctor. Original instructions: TAKE 1 TABLET BY MOUTH DAILY, EXCEPT ON TUES ANDFRIDAY TAKE 1 AND 1/2TAB        Objective:   BP 140/60   Pulse (!) 59   Temp (!) 96.6 F (35.9 C) (Temporal)   Ht 5\' 9"  (1.753 m)   Wt 178 lb  6.4 oz (80.9 kg)   SpO2 92%   BMI 26.35 kg/m   Wt Readings from Last 3 Encounters:  01/16/19 178 lb 6.4 oz (80.9 kg)  12/05/18 179 lb 3.2 oz (81.3 kg)  11/24/18 179 lb (81.2 kg)    Physical Exam Vitals signs and nursing note reviewed.  Constitutional:      General: He is not in acute distress.    Appearance: He is well-developed. He is not diaphoretic.  Eyes:     General: No scleral icterus.    Conjunctiva/sclera: Conjunctivae normal.  Neck:     Thyroid: No thyromegaly.  Skin:    General: Skin is warm and dry.     Findings: No rash.  Neurological:     Mental Status: He is alert and oriented to person, place, and time.     Coordination: Coordination normal.  Psychiatric:        Behavior: Behavior normal.       Assessment & Plan:    Problem List Items Addressed This Visit      Cardiovascular and Mediastinum   FIBRILLATION, ATRIAL - Primary   Relevant Orders   inr FINGERSTICK   PAF (paroxysmal atrial fibrillation) ([place])     Other   Long term (current) use of anticoagulants [Z79.01]      Description   Continue taking 1 1/2 tablets on Mondays, Thursdays, and 1 tablet all other days of the week.   INR today is 2.4 today ( goal is 2.0-3.0)  Perfect reading today Return in 6-8 weeks     Follow up plan: Return if symptoms worsen or fail to improve, for Return in 6 to 8 weeks for hypertension and INR.  Counseling provided for all of the vaccine components Orders Placed This Encounter  Procedures  . inr FINGERSTICK    Caryl Pina, MD [facility] 01/16/2019, 9:25 AM

## 2019-03-04 ENCOUNTER — Telehealth: Payer: Self-pay | Admitting: Family Medicine

## 2019-03-04 DIAGNOSIS — E785 Hyperlipidemia, unspecified: Secondary | ICD-10-CM

## 2019-03-04 DIAGNOSIS — N400 Enlarged prostate without lower urinary tract symptoms: Secondary | ICD-10-CM

## 2019-03-04 DIAGNOSIS — I482 Chronic atrial fibrillation, unspecified: Secondary | ICD-10-CM

## 2019-03-04 DIAGNOSIS — I10 Essential (primary) hypertension: Secondary | ICD-10-CM

## 2019-03-04 NOTE — Telephone Encounter (Signed)
Labs placed patient aware.

## 2019-03-09 ENCOUNTER — Other Ambulatory Visit: Payer: Self-pay

## 2019-03-09 ENCOUNTER — Ambulatory Visit: Payer: Medicare PPO | Admitting: Nurse Practitioner

## 2019-03-09 ENCOUNTER — Encounter: Payer: Self-pay | Admitting: Nurse Practitioner

## 2019-03-09 VITALS — BP 108/47 | HR 61 | Temp 97.5°F | Resp 20 | Ht 69.0 in | Wt 176.0 lb

## 2019-03-09 DIAGNOSIS — R39198 Other difficulties with micturition: Secondary | ICD-10-CM | POA: Diagnosis not present

## 2019-03-09 DIAGNOSIS — N3001 Acute cystitis with hematuria: Secondary | ICD-10-CM

## 2019-03-09 DIAGNOSIS — K5903 Drug induced constipation: Secondary | ICD-10-CM | POA: Diagnosis not present

## 2019-03-09 LAB — MICROSCOPIC EXAMINATION
Epithelial Cells (non renal): NONE SEEN /hpf (ref 0–10)
Renal Epithel, UA: NONE SEEN /hpf
WBC, UA: >30 /hpf — AB (ref 0–5)

## 2019-03-09 LAB — URINALYSIS, COMPLETE
Bilirubin, UA: NEGATIVE
Glucose, UA: NEGATIVE
Ketones, UA: NEGATIVE
Nitrite, UA: NEGATIVE
Specific Gravity, UA: >1.03 — ABNORMAL HIGH (ref 1.005–1.030)
Urobilinogen, Ur: 0.2 mg/dL (ref 0.2–1.0)
pH, UA: 5.5 (ref 5.0–7.5)

## 2019-03-09 MED ORDER — DOXYCYCLINE HYCLATE 100 MG PO TABS: 100.0000 mg | ORAL_TABLET | Freq: Two times a day (BID) | ORAL | 0 refills | Status: DC

## 2019-03-09 NOTE — Progress Notes (Signed)
   Subjective:    Patient ID: Joshua [name], male    DOB: 1945-03-04, 74 y.o.   MRN: [id number]   Chief Complaint: Urinary Retention and No BM since last Thursday   HPI Patient comes in with 2 complaints: - he has bene having urinary hesitation and frequency for several weeks. Can hardly get stream started.  - has not had bowel movement since last Thursday he has started doing his miralax daily the last week nd no real results   Review of Systems  Constitutional: Negative for diaphoresis.  Eyes: Negative for pain.  Respiratory: Negative for shortness of breath.   Cardiovascular: Negative for chest pain, palpitations and leg swelling.  Gastrointestinal: Negative for abdominal pain.  Endocrine: Negative for polydipsia.  Skin: Negative for rash.  Neurological: Negative for dizziness, weakness and headaches.  Hematological: Does not bruise/bleed easily.  All other systems reviewed and are negative.      Objective:   Physical Exam Vitals and nursing note reviewed.  Constitutional:      Appearance: Normal appearance.  Cardiovascular:     Rate and Rhythm: Regular rhythm.     Heart sounds: Normal heart sounds.  Abdominal:     Tenderness: There is no right CVA tenderness or left CVA tenderness.  Skin:    General: Skin is warm.  Neurological:     General: No focal deficit present.     Mental Status: He is alert and oriented to person, place, and time.  Psychiatric:        Mood and Affect: Mood normal.        Behavior: Behavior normal.    BP (!) 108/47   Pulse 61   Temp (!) 97.5 F (36.4 C) (Temporal)   Resp 20   Ht 5\' 9"  (1.753 m)   Wt 176 lb (79.8 kg)   SpO2 98%   BMI 25.99 kg/m         Assessment & Plan:  Joshua [name] in today with chief complaint of Urinary Retention and No BM since last Thursday   1. Difficulty in urination - urinalysis- dip and micro  2. Acute cystitis with hematuria May have some prostatitis noted as well Force fluids Says that he  can take doxycycline as long as he takes it with food - doxycycline (VIBRA-TABS) 100 MG tablet; Take 1 tablet (100 mg total) by mouth 2 (two) times daily. 1 po bid  Dispense: 28 tablet; Refill: 0  3. Drug-induced constipation MOM and applejuice Increase fiber in diet continue miralax daily  Chevis Pretty, FNP

## 2019-03-09 NOTE — Patient Instructions (Signed)

## 2019-03-11 ENCOUNTER — Other Ambulatory Visit: Payer: Medicare PPO

## 2019-03-11 DIAGNOSIS — I10 Essential (primary) hypertension: Secondary | ICD-10-CM

## 2019-03-11 DIAGNOSIS — N400 Enlarged prostate without lower urinary tract symptoms: Secondary | ICD-10-CM

## 2019-03-11 DIAGNOSIS — E785 Hyperlipidemia, unspecified: Secondary | ICD-10-CM

## 2019-03-12 ENCOUNTER — Encounter: Payer: Self-pay | Admitting: Family Medicine

## 2019-03-12 ENCOUNTER — Telehealth: Payer: Self-pay | Admitting: Family Medicine

## 2019-03-12 DIAGNOSIS — R972 Elevated prostate specific antigen [PSA]: Secondary | ICD-10-CM

## 2019-03-12 DIAGNOSIS — N400 Enlarged prostate without lower urinary tract symptoms: Secondary | ICD-10-CM

## 2019-03-12 LAB — LIPID PANEL
Chol/HDL Ratio: 3.7 ratio (ref 0.0–5.0)
Cholesterol, Total: 128 mg/dL (ref 100–199)
HDL: 35 mg/dL — ABNORMAL LOW (ref >39)
LDL Chol Calc (NIH): 74 mg/dL (ref 0–99)
Triglycerides: 103 mg/dL (ref 0–149)
VLDL Cholesterol Cal: 19 mg/dL (ref 5–40)

## 2019-03-12 LAB — CMP14+EGFR
ALT: 59 IU/L — ABNORMAL HIGH (ref 0–44)
AST: 61 IU/L — ABNORMAL HIGH (ref 0–40)
Albumin/Globulin Ratio: 1.1 — ABNORMAL LOW (ref 1.2–2.2)
Albumin: 3.4 g/dL — ABNORMAL LOW (ref 3.7–4.7)
Alkaline Phosphatase: 90 IU/L (ref 39–117)
BUN/Creatinine Ratio: 22 (ref 10–24)
BUN: 22 mg/dL (ref 8–27)
Bilirubin Total: 0.5 mg/dL (ref 0.0–1.2)
CO2: 29 mmol/L (ref 20–29)
Calcium: 8.5 mg/dL — ABNORMAL LOW (ref 8.6–10.2)
Chloride: 96 mmol/L (ref 96–106)
Creatinine, Ser: 0.98 mg/dL (ref 0.76–1.27)
GFR calc Af Amer: 87 mL/min/{1.73_m2} (ref >59)
GFR calc non Af Amer: 76 mL/min/{1.73_m2} (ref >59)
Globulin, Total: 3 g/dL (ref 1.5–4.5)
Glucose: 85 mg/dL (ref 65–99)
Potassium: 4 mmol/L (ref 3.5–5.2)
Sodium: 137 mmol/L (ref 134–144)
Total Protein: 6.4 g/dL (ref 6.0–8.5)

## 2019-03-12 LAB — CBC WITH DIFFERENTIAL/PLATELET
Basophils Absolute: 0.1 10*3/uL (ref 0.0–0.2)
Basos: 1 %
EOS (ABSOLUTE): 0.2 10*3/uL (ref 0.0–0.4)
Eos: 4 %
Hematocrit: 46.5 % (ref 37.5–51.0)
Hemoglobin: 15.3 g/dL (ref 13.0–17.7)
Immature Grans (Abs): 0 10*3/uL (ref 0.0–0.1)
Immature Granulocytes: 0 %
Lymphocytes Absolute: 1 10*3/uL (ref 0.7–3.1)
Lymphs: 17 %
MCH: 31.2 pg (ref 26.6–33.0)
MCHC: 32.9 g/dL (ref 31.5–35.7)
MCV: 95 fL (ref 79–97)
Monocytes Absolute: 0.5 10*3/uL (ref 0.1–0.9)
Monocytes: 10 %
Neutrophils Absolute: 3.8 10*3/uL (ref 1.4–7.0)
Neutrophils: 68 %
Platelets: 134 10*3/uL — ABNORMAL LOW (ref 150–450)
RBC: 4.91 x10E6/uL (ref 4.14–5.80)
RDW: 12.3 % (ref 11.6–15.4)
WBC: 5.6 10*3/uL (ref 3.4–10.8)

## 2019-03-12 LAB — PSA, TOTAL AND FREE
PSA, Free Pct: 5.9 %
PSA, Free: 0.42 ng/mL
Prostate Specific Ag, Serum: 7.1 ng/mL — ABNORMAL HIGH (ref 0.0–4.0)

## 2019-03-12 MED ORDER — TAMSULOSIN HCL 0.4 MG PO CAPS: 0.4000 mg | ORAL_CAPSULE | Freq: Every day | ORAL | 3 refills | Status: DC

## 2019-03-12 NOTE — Telephone Encounter (Signed)
lmtcb

## 2019-03-12 NOTE — Telephone Encounter (Signed)
Patient aware and verbalizes understanding. 

## 2019-03-12 NOTE — Telephone Encounter (Signed)
Patient has a very elevated PSA, I have done a referral to urology for him I think he needs to go speak with one of them.  I do not know if he seen one before but if not I have put in a referral to [facility].  Patient's liver function panel is elevated, I want him to come in in a week or 2 to do a blood draw and recheck his liver function, stay hydrated between now and then with plenty of water and avoid any alcohol or Tylenol and we will recheck it then and see if we need to adjust anything else.  The rest of his labs are relatively normal. Caryl Pina, MD [facility] 03/12/2019, 2:11 PM

## 2019-03-20 ENCOUNTER — Other Ambulatory Visit: Payer: Self-pay

## 2019-03-23 ENCOUNTER — Other Ambulatory Visit: Payer: Self-pay

## 2019-03-23 ENCOUNTER — Ambulatory Visit: Payer: Medicare PPO | Admitting: Family Medicine

## 2019-03-23 ENCOUNTER — Encounter: Payer: Self-pay | Admitting: Family Medicine

## 2019-03-23 VITALS — BP 113/55 | HR 62 | Temp 97.5°F | Ht 69.0 in | Wt 175.2 lb

## 2019-03-23 DIAGNOSIS — I480 Paroxysmal atrial fibrillation: Secondary | ICD-10-CM | POA: Diagnosis not present

## 2019-03-23 DIAGNOSIS — Z7901 Long term (current) use of anticoagulants: Secondary | ICD-10-CM | POA: Diagnosis not present

## 2019-03-23 DIAGNOSIS — I482 Chronic atrial fibrillation, unspecified: Secondary | ICD-10-CM

## 2019-03-23 LAB — COAGUCHEK XS/INR WAIVED
INR: 3.1 — ABNORMAL HIGH (ref 0.9–1.1)
Prothrombin Time: 37.8 s

## 2019-03-23 MED ORDER — OMEPRAZOLE 10 MG PO CPDR: 10.0000 mg | DELAYED_RELEASE_CAPSULE | Freq: Every day | ORAL | 3 refills | Status: DC

## 2019-03-23 MED ORDER — WARFARIN SODIUM 4 MG PO TABS: ORAL_TABLET | ORAL | 3 refills | Status: DC

## 2019-03-23 NOTE — Addendum Note (Signed)
Addended by: Caryl Pina on: 03/23/2019 08:54 AM   Modules accepted: Orders

## 2019-03-23 NOTE — Progress Notes (Signed)
BP (!) 113/55   Pulse 62   Temp (!) 97.5 F (36.4 C) (Temporal)   Ht 5\' 9"  (1.753 m)   Wt 175 lb 3.2 oz (79.5 kg)   SpO2 93%   BMI 25.87 kg/m    Subjective:   Patient ID: Joshua [name], male    DOB: 01-Sep-1945, 74 y.o.   MRN: [id number]  HPI: Joshua [name] is a 74 y.o. male presenting on 03/23/2019 for Coagulation Disorder   HPI Coumadin recheck Target goal: 2.0-3.0, slightly high at 3.1 today Reason on anticoagulation: Paroxysmal A. fib Patient denies any bruising or bleeding or chest pain or palpitations   Relevant past medical, surgical, family and social history reviewed and updated as indicated. Interim medical history since our last visit reviewed. Allergies and medications reviewed and updated.  Review of Systems  Constitutional: Negative for chills and fever.  Respiratory: Negative for shortness of breath and wheezing.   Cardiovascular: Negative for chest pain and leg swelling.  Gastrointestinal: Negative for blood in stool.  Genitourinary: Negative for hematuria.  Musculoskeletal: Negative for back pain and gait problem.  Skin: Negative for rash.  Neurological: Negative for dizziness, weakness and light-headedness.  All other systems reviewed and are negative.   Per HPI unless specifically indicated above   Allergies as of 03/23/2019      Reactions   Tetracycline Other (See Comments)   Issue with stomach lining - can take with food if needed      Medication List       Accurate as of March 23, 2019  8:52 AM. If you have any questions, ask your nurse or doctor.        STOP taking these medications   doxycycline 100 MG tablet Commonly known as: VIBRA-TABS Stopped by: [name] Fadel Clason, MD     TAKE these medications   acetaminophen 325 MG tablet Commonly known as: TYLENOL Take 650 mg by mouth as needed.   aspirin EC 81 MG tablet Take 81 mg by mouth daily.   cholecalciferol 1000 units tablet Commonly known as: VITAMIN D Take 1,000 Units by  mouth daily.   cyanocobalamin 1000 MCG tablet Commonly known as: CVS VITAMIN B12 Take 1 tablet (1,000 mcg total) by mouth daily.   disopyramide 150 MG capsule Commonly known as: NORPACE Take 2 capsules (300 mg total) by mouth 2 (two) times daily.   hydrochlorothiazide 25 MG tablet Commonly known as: HYDRODIURIL Take 0.5 tablets (12.5 mg total) by mouth as needed.   losartan 50 MG tablet Commonly known as: COZAAR Take 50 mg by mouth daily.   omeprazole 10 MG capsule Commonly known as: PRILOSEC Take 1 capsule (10 mg total) by mouth daily.   polyethylene glycol 17 g packet Commonly known as: MIRALAX / GLYCOLAX Take 17 g by mouth every other day.   rosuvastatin 10 MG tablet Commonly known as: CRESTOR Take 1 tablet (10 mg total) by mouth daily.   tamsulosin 0.4 MG Caps capsule Commonly known as: FLOMAX Take 1 capsule (0.4 mg total) by mouth daily.   warfarin 4 MG tablet Commonly known as: Jantoven Take as directed by the anticoagulation clinic. If you are unsure how to take this medication, talk to your nurse or doctor. Original instructions: TAKE 1 TABLET BY MOUTH DAILY, EXCEPT ON TUES ANDFRIDAY TAKE 1 AND 1/2TAB        Objective:   BP (!) 113/55   Pulse 62   Temp (!) 97.5 F (36.4 C) (Temporal)   Ht  5\' 9"  (1.753 m)   Wt 175 lb 3.2 oz (79.5 kg)   SpO2 93%   BMI 25.87 kg/m   Wt Readings from Last 3 Encounters:  03/23/19 175 lb 3.2 oz (79.5 kg)  03/09/19 176 lb (79.8 kg)  01/16/19 178 lb 6.4 oz (80.9 kg)    Physical Exam Vitals and nursing note reviewed.  Constitutional:      General: He is not in acute distress.    Appearance: He is well-developed. He is not diaphoretic.  Eyes:     General: No scleral icterus.    Conjunctiva/sclera: Conjunctivae normal.  Neck:     Thyroid: No thyromegaly.  Skin:    General: Skin is warm and dry.     Findings: No rash.  Neurological:     Mental Status: He is alert and oriented to person, place, and time.      Coordination: Coordination normal.  Psychiatric:        Behavior: Behavior normal.     Description   Take only 1 tablet a day instead of 1-1-1/2 and then continue taking 1 1/2 tablets on Mondays, Thursdays, and 1 tablet all other days of the week.   INR today is 3.1 today ( goal is 2.0-3.0)  Perfect reading today Return in 6-8 weeks      Assessment & Plan:   Problem List Items Addressed This Visit      Cardiovascular and Mediastinum   FIBRILLATION, ATRIAL - Primary   Relevant Orders   CoaguChek XS/INR Waived   PAF (paroxysmal atrial fibrillation) ([place])     Other   Long term (current) use of anticoagulants [Z79.01]       Follow up plan: Return if symptoms worsen or fail to improve, for 4 to 6-week INR recheck.  Counseling provided for all of the vaccine components Orders Placed This Encounter  Procedures  . CoaguChek XS/INR [place], MD [facility] 03/23/2019, 8:52 AM

## 2019-03-28 ENCOUNTER — Encounter: Payer: Self-pay | Admitting: Family Medicine

## 2019-03-28 ENCOUNTER — Other Ambulatory Visit: Payer: Self-pay | Admitting: Family Medicine

## 2019-03-28 MED ORDER — CIPROFLOXACIN HCL 500 MG PO TABS: 500.0000 mg | ORAL_TABLET | Freq: Two times a day (BID) | ORAL | 0 refills | Status: DC

## 2019-03-28 MED ORDER — CIPROFLOXACIN HCL 500 MG PO TABS: 500.0000 mg | ORAL_TABLET | Freq: Two times a day (BID) | ORAL | 0 refills | Status: AC

## 2019-03-28 NOTE — Progress Notes (Signed)
Pt . Called with recurrent dysuria starting this AM. Just finished doxycycline a few days ago.

## 2019-04-18 ENCOUNTER — Encounter: Payer: Self-pay | Admitting: Family Medicine

## 2019-05-11 ENCOUNTER — Other Ambulatory Visit: Payer: Self-pay

## 2019-05-11 ENCOUNTER — Ambulatory Visit: Payer: Medicare PPO | Admitting: Family Medicine

## 2019-05-11 ENCOUNTER — Encounter: Payer: Self-pay | Admitting: Family Medicine

## 2019-05-11 ENCOUNTER — Ambulatory Visit (INDEPENDENT_AMBULATORY_CARE_PROVIDER_SITE_OTHER): Payer: Medicare PPO

## 2019-05-11 VITALS — BP 110/46 | HR 58 | Temp 97.5°F | Ht 69.0 in | Wt 182.0 lb

## 2019-05-11 DIAGNOSIS — Z7901 Long term (current) use of anticoagulants: Secondary | ICD-10-CM | POA: Diagnosis not present

## 2019-05-11 DIAGNOSIS — I480 Paroxysmal atrial fibrillation: Secondary | ICD-10-CM

## 2019-05-11 DIAGNOSIS — J984 Other disorders of lung: Secondary | ICD-10-CM | POA: Diagnosis not present

## 2019-05-11 DIAGNOSIS — N400 Enlarged prostate without lower urinary tract symptoms: Secondary | ICD-10-CM

## 2019-05-11 DIAGNOSIS — I482 Chronic atrial fibrillation, unspecified: Secondary | ICD-10-CM

## 2019-05-11 LAB — COAGUCHEK XS/INR WAIVED
INR: 2 — ABNORMAL HIGH (ref 0.9–1.1)
Prothrombin Time: 24.6 s

## 2019-05-11 MED ORDER — TAMSULOSIN HCL 0.4 MG PO CAPS: 0.4000 mg | ORAL_CAPSULE | Freq: Every day | ORAL | 3 refills | Status: DC

## 2019-05-11 NOTE — Progress Notes (Signed)
BP (!) 110/46   Pulse (!) 58   Temp (!) 97.5 F (36.4 C)   Ht 5\' 9"  (1.753 m)   Wt 182 lb (82.6 kg)   SpO2 96%   BMI 26.88 kg/m    Subjective:   Patient ID: Joshua [name], male    DOB: May 06, 1945, 74 y.o.   MRN: [id number]  HPI: Joshua [name] is a 74 y.o. male presenting on 05/11/2019 for Medical Management of Chronic Issues and Atrial Fibrillation   HPI Patient is seen urologist for his elevated PSA and prostate enlargement, they have a follow-up with him later today.  Coumadin recheck Target goal: 2.0-3.0 Reason on anticoagulation: Chronic A. fib Patient denies any bruising or bleeding or chest pain or palpitations   Relevant past medical, surgical, family and social history reviewed and updated as indicated. Interim medical history since our last visit reviewed. Allergies and medications reviewed and updated.  Review of Systems  Constitutional: Negative for chills and fever.  Respiratory: Positive for shortness of breath (Shortness of breath on exertion at times). Negative for wheezing.   Cardiovascular: Negative for chest pain and leg swelling.  Musculoskeletal: Negative for back pain and gait problem.  Skin: Negative for rash.  All other systems reviewed and are negative.   Per HPI unless specifically indicated above   Allergies as of 05/11/2019      Reactions   Tetracycline Other (See Comments)   Issue with stomach lining - can take with food if needed      Medication List       Accurate as of May 11, 2019 10:23 AM. If you have any questions, ask your nurse or doctor.        acetaminophen 325 MG tablet Commonly known as: TYLENOL Take 650 mg by mouth as needed.   aspirin EC 81 MG tablet Take 81 mg by mouth daily.   cholecalciferol 1000 units tablet Commonly known as: VITAMIN D Take 1,000 Units by mouth daily.   cyanocobalamin 1000 MCG tablet Commonly known as: CVS VITAMIN B12 Take 1 tablet (1,000 mcg total) by mouth daily.   disopyramide 150  MG capsule Commonly known as: NORPACE Take 2 capsules (300 mg total) by mouth 2 (two) times daily.   hydrochlorothiazide 25 MG tablet Commonly known as: HYDRODIURIL Take 0.5 tablets (12.5 mg total) by mouth as needed.   losartan 50 MG tablet Commonly known as: COZAAR Take 50 mg by mouth daily.   omeprazole 10 MG capsule Commonly known as: PRILOSEC Take 1 capsule (10 mg total) by mouth daily.   polyethylene glycol 17 g packet Commonly known as: MIRALAX / GLYCOLAX Take 17 g by mouth every other day.   rosuvastatin 10 MG tablet Commonly known as: CRESTOR Take 1 tablet (10 mg total) by mouth daily.   tamsulosin 0.4 MG Caps capsule Commonly known as: FLOMAX Take 1 capsule (0.4 mg total) by mouth daily.   warfarin 4 MG tablet Commonly known as: Jantoven Take as directed by the anticoagulation clinic. If you are unsure how to take this medication, talk to your nurse or doctor. Original instructions: TAKE 1 TABLET BY MOUTH DAILY, EXCEPT ON TUES ANDFRIDAY TAKE 1 AND 1/2TAB        Objective:   BP (!) 110/46   Pulse (!) 58   Temp (!) 97.5 F (36.4 C)   Ht 5\' 9"  (1.753 m)   Wt 182 lb (82.6 kg)   SpO2 96%   BMI 26.88 kg/m   Wt  Readings from Last 3 Encounters:  05/11/19 182 lb (82.6 kg)  03/23/19 175 lb 3.2 oz (79.5 kg)  03/09/19 176 lb (79.8 kg)    Physical Exam Vitals and nursing note reviewed.  Constitutional:      General: He is not in acute distress.    Appearance: He is well-developed. He is not diaphoretic.  Eyes:     General: No scleral icterus.    Conjunctiva/sclera: Conjunctivae normal.  Neck:     Thyroid: No thyromegaly.  Cardiovascular:     Rate and Rhythm: Normal rate and regular rhythm.     Heart sounds: Normal heart sounds. No murmur.  Pulmonary:     Effort: Pulmonary effort is normal. No respiratory distress.     Breath sounds: Normal breath sounds. No wheezing.  Musculoskeletal:        General: Normal range of motion.     Cervical back: Neck  supple.  Lymphadenopathy:     Cervical: No cervical adenopathy.  Skin:    General: Skin is warm and dry.     Findings: No rash.  Neurological:     Mental Status: He is alert and oriented to person, place, and time.     Coordination: Coordination normal.  Psychiatric:        Behavior: Behavior normal.     Description   continue taking 1 1/2 tablets on Mondays, Thursdays, and 1 tablet all other days of the week.   INR today is 2.0 today ( goal is 2.0-3.0)  Perfect reading today Return in 6-8 weeks      Assessment & Plan:   Problem List Items Addressed This Visit      Cardiovascular and Mediastinum   FIBRILLATION, ATRIAL - Primary   PAF (paroxysmal atrial fibrillation) ([facility])     Other   Long term (current) use of anticoagulants [Z79.01]   Relevant Orders   CoaguChek XS/INR Waived    Other Visit Diagnoses    Prostate enlargement       Relevant Medications   tamsulosin (FLOMAX) 0.4 MG CAPS capsule   Pulmonary scarring       Relevant Orders   DG Chest 2 View    Continue Flomax, will get chest x-ray for follow-up on pulmonary scarring in the past  Await final read on chest x-ray from radiology.  Follow up plan: Return if symptoms worsen or fail to improve, for 4 to 6-week INR recheck.  Counseling provided for all of the vaccine components Orders Placed This Encounter  Procedures  . DG Chest 2 View  . CoaguChek XS/INR [place], MD [facility] 05/11/2019, 10:23 AM

## 2019-05-28 ENCOUNTER — Other Ambulatory Visit (HOSPITAL_COMMUNITY): Payer: Medicare Other

## 2019-06-01 ENCOUNTER — Ambulatory Visit (INDEPENDENT_AMBULATORY_CARE_PROVIDER_SITE_OTHER): Payer: Medicare PPO | Admitting: Family Medicine

## 2019-06-01 ENCOUNTER — Encounter: Payer: Self-pay | Admitting: Family Medicine

## 2019-06-01 DIAGNOSIS — M7989 Other specified soft tissue disorders: Secondary | ICD-10-CM

## 2019-06-01 MED ORDER — FUROSEMIDE 20 MG PO TABS: 20.0000 mg | ORAL_TABLET | Freq: Every day | ORAL | 0 refills | Status: DC

## 2019-06-01 NOTE — Progress Notes (Signed)
Virtual Visit via telephone Note Due to COVID-19 pandemic this visit was conducted virtually. This visit type was conducted due to national recommendations for restrictions regarding the COVID-19 Pandemic (e.g. social distancing, sheltering in place) in an effort to limit this patient's exposure and mitigate transmission in our community. All issues noted in this document were discussed and addressed.  A physical exam was not performed with this format.   I connected with Joshua [name] on 06/01/2019 at [facility] by telephone and verified that I am speaking with the correct person using two identifiers. Joshua [name] is currently located at home and no one is currently with them during visit. The provider, Monia Pouch, FNP is located in their office at time of visit.  I discussed the limitations, risks, security and privacy concerns of performing an evaluation and management service by telephone and the availability of in person appointments. I also discussed with the patient that there may be a patient responsible charge related to this service. The patient expressed understanding and agreed to proceed.  Subjective:  Patient ID: Joshua [name], male    DOB: [birth year], 74 y.o.   MRN: [id number]  Chief Complaint:  Leg Swelling   HPI: Joshua [name] is a 74 y.o. male presenting on 06/01/2019 for Leg Swelling   Pt reports bilateral leg swelling. Onset over the last several days. No erythema, pain, calf swelling or tenderness, SHOB, DOE, PND, orthopnea, palpitations, or dizziness. No syncope. States the swelling improves over night and returns throughout the day. States he was on Lasix in the past for this but was taken off because the swelling subsided.     Relevant past medical, surgical, family, and social history reviewed and updated as indicated.  Allergies and medications reviewed and updated.   Past Medical History:  Diagnosis Date  . Acute lower GI bleeding    related to gastritis / viral  infection  . Atrial fibrillation ([place])    persistent  . Cataract   . Cirrhosis ([place])   . COPD (chronic obstructive pulmonary disease) ([place])    PFT 05-05-2010, FEV1 .9 (26%) ratio 60  . Coronary artery disease   . Gastritis 06-10-2006  . Hard of hearing   . Hiatal hernia 06-10-2006   EGD  . HOH (hard of hearing)   . Hx of adenomatous colonic polyps 2005, 2013   Colonoscopy  . Hypercholesteremia   . Hypercholesteremia   . Hypertension   . Hypertr obst cardiomyop    mild subaortic stenosis  . Iron deficiency anemia, unspecified   . LVH (left ventricular hypertrophy)   . Other B-complex deficiencies   . Rheumatic fever   . Thrombocytopenia ([place]) 05/2008    Past Surgical History:  Procedure Laterality Date  . CARDIAC CATHETERIZATION  05/28/2005   Widely patent coronaries and normal LV function.  Marland Kitchen CARDIAC CATHETERIZATION  08/11/2001   80% LAD stenosis successfully stented with a 3.5x83mm Cypher DES postdilated to 3.54mm resulting in reduction of 80% to 0%.  . CARDIAC CATHETERIZATION  01/15/2001   Focal 95% proximal RCA stenosis, stented with a 3x42mm Zeta multilink stent with postdilatation utilizing a 3.5x20mm Quantum balloon with stenosis being reduced to 0%.  . CARDIOVASCULAR STRESS TEST  05/29/2011   No scintigraphic evidence of inducible myocardial ischemia. No ECG changes. EKG negative for ischemia.  Marland Kitchen CARDIOVERSION  May 2012   [name]  . CORONARY ANGIOPLASTY     2002-2003  . SHOULDER SURGERY    . TRANSTHORACIC ECHOCARDIOGRAM  05/29/2011   EF >70%, severe concentric LV hypertrophy, severe mitral annular calcification, mild-moderate aortic regurg,     Social History   Socioeconomic History  . Marital status: Widowed    Spouse name: Not on file  . Number of children: 5  . Years of education: Not on file  . Highest education level: Not on file  Occupational History  . 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 Smoking status: Former Smoker      Packs/day: 1.00    Years: 10.00    Pack years: 10.00    Types: Cigarettes    Quit date: 02/26/1985    Years since quitting: 34.2  . Smokeless tobacco: Never Used  Substance and Sexual Activity  . Alcohol use: No  . Drug use: No  . Sexual activity: Not on file  Other Topics Concern  . Not on file  Social History Narrative  . Not on file   Social Determinants of Health   Financial Resource Strain:   . Difficulty of Paying Living Expenses:   Food Insecurity:   . Worried About Charity fundraiser in the Last Year:   . Arboriculturist in the Last Year:   Transportation Needs:   . Film/video editor (Medical):   Marland Kitchen Lack of Transportation (Non-Medical):   Physical Activity:   . Days of Exercise per Week:   . Minutes of Exercise per Session:   Stress:   . Feeling of Stress :   Social Connections:   . Frequency of Communication with Friends and Family:   . Frequency of Social Gatherings with Friends and Family:   . Attends Religious Services:   . Active Member of Clubs or Organizations:   . Attends Archivist Meetings:   Marland Kitchen Marital Status:   Intimate Partner Violence:   . Fear of Current or Ex-Partner:   . Emotionally Abused:   Marland Kitchen Physically Abused:   . Sexually Abused:     Outpatient Encounter Medications as of 06/01/2019  Medication Sig  . acetaminophen (TYLENOL) 325 MG tablet Take 650 mg by mouth as needed.   Marland Kitchen aspirin EC 81 MG tablet Take 81 mg by mouth daily.  . cholecalciferol (VITAMIN D) 1000 units tablet Take 1,000 Units by mouth daily.  . cyanocobalamin ([employer] VITAMIN B12) 1000 MCG tablet Take 1 tablet (1,000 mcg total) by mouth daily.  . disopyramide (NORPACE) 150 MG capsule Take 2 capsules (300 mg total) by mouth 2 (two) times daily.  . furosemide (LASIX) 20 MG tablet Take 1 tablet (20 mg total) by mouth daily.  . hydrochlorothiazide (HYDRODIURIL) 25 MG tablet Take 0.5 tablets (12.5 mg total) by mouth as needed.  Marland Kitchen losartan (COZAAR) 50 MG tablet Take  50 mg by mouth daily.  Marland Kitchen omeprazole (PRILOSEC) 10 MG capsule Take 1 capsule (10 mg total) by mouth daily.  . polyethylene glycol (MIRALAX / GLYCOLAX) packet Take 17 g by mouth every other day.  . rosuvastatin (CRESTOR) 10 MG tablet Take 1 tablet (10 mg total) by mouth daily.  . tamsulosin (FLOMAX) 0.4 MG CAPS capsule Take 1 capsule (0.4 mg total) by mouth daily.  Marland Kitchen warfarin (JANTOVEN) 4 MG tablet TAKE 1 TABLET BY MOUTH DAILY, EXCEPT ON TUES ANDFRIDAY TAKE 1 AND 1/2TAB   No facility-administered encounter medications on file as of 06/01/2019.    Allergies  Allergen Reactions  . Tetracycline Other (See Comments)    Issue with stomach lining - can  take with food if needed    Review of Systems  Constitutional: Negative for activity change, appetite change, chills, diaphoresis, fatigue, fever and unexpected weight change.  HENT: Negative.   Eyes: Negative.  Negative for photophobia and visual disturbance.  Respiratory: Negative for cough, chest tightness and shortness of breath.   Cardiovascular: Positive for leg swelling. Negative for chest pain and palpitations.  Gastrointestinal: Negative for abdominal distention, abdominal pain, blood in stool, constipation, diarrhea, nausea and vomiting.  Endocrine: Negative.   Genitourinary: Negative for decreased urine volume, difficulty urinating, dysuria, frequency, penile swelling, scrotal swelling and urgency.  Musculoskeletal: Negative for arthralgias and myalgias.  Skin: Negative.   Allergic/Immunologic: Negative.   Neurological: Negative for dizziness, tremors, seizures, syncope, facial asymmetry, speech difficulty, weakness, light-headedness, numbness and headaches.  Hematological: Negative.   Psychiatric/Behavioral: Negative for confusion, hallucinations, sleep disturbance and suicidal ideas.  All other systems reviewed and are negative.        Observations/Objective: No vital signs or physical exam, this was a telephone or virtual  health encounter.  Pt alert and oriented, answers all questions appropriately, and able to speak in full sentences.    Assessment and Plan: Renato was seen today for leg swelling.  Diagnoses and all orders for this visit:  Leg swelling No red flags present concerning for DVT. Pt is anticoagulated. Likely dependent edema. Pt advised to limit sodium intake and elevate legs as often as possible. Compression hose suggested. Will initiate low dose lasix. Pt to follow up in 2-4 weeks for reevaluation and BMP. Pt aware to report any new, worsening, or persistent symptoms.  -     furosemide (LASIX) 20 MG tablet; Take 1 tablet (20 mg total) by mouth daily.     Follow Up Instructions: Return in about 2 weeks (around 06/15/2019), or if symptoms worsen or fail to improve.    I discussed the assessment and treatment plan with the patient. The patient was provided an opportunity to ask questions and all were answered. The patient agreed with the plan and demonstrated an understanding of the instructions.   The patient was advised to call back or seek an in-person evaluation if the symptoms worsen or if the condition fails to improve as anticipated.  The above assessment and management plan was discussed with the patient. The patient verbalized understanding of and has agreed to the management plan. Patient is aware to call the clinic if they develop any new symptoms or if symptoms persist or worsen. Patient is aware when to return to the clinic for a follow-up visit. Patient educated on when it is appropriate to go to the emergency department.    I provided 15 minutes of non-face-to-face time during this encounter. The call started at [place]. The call ended at 75. The other time was used for coordination of care.    Monia Pouch, FNP-C [facility] [address] [place], [place] [address] [phone] 06/01/2019

## 2019-06-12 ENCOUNTER — Ambulatory Visit: Payer: Medicare PPO | Admitting: Cardiovascular Disease

## 2019-06-12 ENCOUNTER — Other Ambulatory Visit: Payer: Self-pay

## 2019-06-12 ENCOUNTER — Encounter: Payer: Self-pay | Admitting: Cardiovascular Disease

## 2019-06-12 VITALS — BP 102/52 | HR 51 | Ht 69.0 in | Wt 182.2 lb

## 2019-06-12 DIAGNOSIS — I2510 Atherosclerotic heart disease of native coronary artery without angina pectoris: Secondary | ICD-10-CM | POA: Diagnosis not present

## 2019-06-12 DIAGNOSIS — I050 Rheumatic mitral stenosis: Secondary | ICD-10-CM | POA: Diagnosis not present

## 2019-06-12 DIAGNOSIS — I4892 Unspecified atrial flutter: Secondary | ICD-10-CM | POA: Diagnosis not present

## 2019-06-12 DIAGNOSIS — I480 Paroxysmal atrial fibrillation: Secondary | ICD-10-CM | POA: Diagnosis not present

## 2019-06-12 DIAGNOSIS — I421 Obstructive hypertrophic cardiomyopathy: Secondary | ICD-10-CM | POA: Diagnosis not present

## 2019-06-12 DIAGNOSIS — I4891 Unspecified atrial fibrillation: Secondary | ICD-10-CM

## 2019-06-12 DIAGNOSIS — I519 Heart disease, unspecified: Secondary | ICD-10-CM | POA: Diagnosis not present

## 2019-06-12 DIAGNOSIS — Z7901 Long term (current) use of anticoagulants: Secondary | ICD-10-CM | POA: Diagnosis not present

## 2019-06-12 DIAGNOSIS — I5189 Other ill-defined heart diseases: Secondary | ICD-10-CM
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
## 2019-06-12 NOTE — Patient Instructions (Signed)
Medication Instructions:  CONTINUE WITH CURRENT MEDICATIONS. NO CHANGES.  *If you need a refill on your cardiac medications before your next appointment, please call your pharmacy*   Lab Work: Today: cmet Mag tsh Cbc  If you have labs (blood work) drawn today and your tests are completely normal, you will receive your results only by: Marland Kitchen MyChart Message (if you have MyChart) OR . A paper copy in the mail If you have any lab test that is abnormal or we need to change your treatment, we will call you to review the results.   Testing/Procedures: in may Your physician has requested that you have an echocardiogram. Echocardiography is a painless test that uses sound waves to create images of your heart. It provides your doctor with information about the size and shape of your heart and how well your heart's chambers and valves are working. This procedure takes approximately one hour. There are no restrictions for this procedure.  [address]    Follow-Up: At [facility], you and your health needs are our priority.  As part of our continuing mission to provide you with exceptional heart care, we have created designated Provider Care Teams.  These Care Teams include your primary Cardiologist (physician) and Advanced Practice Providers (APPs -  Physician Assistants and Nurse Practitioners) who all work together to provide you with the care you need, when you need it.  We recommend signing up for the patient portal called "MyChart".  Sign up information is provided on this After Visit Summary.  MyChart is used to connect with patients for Virtual Visits (Telemedicine).  Patients are able to view lab/test results, encounter notes, upcoming appointments, etc.  Non-urgent messages can be sent to your provider as well.   To learn more about what you can do with MyChart, go to [email or web address].    Your next appointment:  In [place] 3 month(s)  The format for your next appointment:    In Person  Provider:   Shelva Majestic, MD   Other Instructions

## 2019-06-13 LAB — COMPREHENSIVE METABOLIC PANEL
ALT: 15 IU/L (ref 0–44)
AST: 26 IU/L (ref 0–40)
Albumin/Globulin Ratio: 1.2 (ref 1.2–2.2)
Albumin: 3.9 g/dL (ref 3.7–4.7)
Alkaline Phosphatase: 71 IU/L (ref 39–117)
BUN/Creatinine Ratio: 19 (ref 10–24)
BUN: 23 mg/dL (ref 8–27)
Bilirubin Total: 0.7 mg/dL (ref 0.0–1.2)
CO2: 32 mmol/L — ABNORMAL HIGH (ref 20–29)
Calcium: 9 mg/dL (ref 8.6–10.2)
Chloride: 97 mmol/L (ref 96–106)
Creatinine, Ser: 1.18 mg/dL (ref 0.76–1.27)
GFR calc Af Amer: 70 mL/min/{1.73_m2} (ref >59)
GFR calc non Af Amer: 60 mL/min/{1.73_m2} (ref >59)
Globulin, Total: 3.3 g/dL (ref 1.5–4.5)
Glucose: 72 mg/dL (ref 65–99)
Potassium: 4.5 mmol/L (ref 3.5–5.2)
Sodium: 142 mmol/L (ref 134–144)
Total Protein: 7.2 g/dL (ref 6.0–8.5)

## 2019-06-13 LAB — CBC
Hematocrit: 45.8 % (ref 37.5–51.0)
Hemoglobin: 15.5 g/dL (ref 13.0–17.7)
MCH: 32.3 pg (ref 26.6–33.0)
MCHC: 33.8 g/dL (ref 31.5–35.7)
MCV: 95 fL (ref 79–97)
Platelets: 127 10*3/uL — ABNORMAL LOW (ref 150–450)
RBC: 4.8 x10E6/uL (ref 4.14–5.80)
RDW: 12.4 % (ref 11.6–15.4)
WBC: 5.6 10*3/uL (ref 3.4–10.8)

## 2019-06-13 LAB — MAGNESIUM: Magnesium: 2.3 mg/dL (ref 1.6–2.3)

## 2019-06-13 LAB — TSH: TSH: 2.41 u[IU]/mL (ref 0.450–4.500)

## 2019-06-14 ENCOUNTER — Encounter: Payer: Self-pay | Admitting: Cardiovascular Disease

## 2019-06-16 ENCOUNTER — Ambulatory Visit (INDEPENDENT_AMBULATORY_CARE_PROVIDER_SITE_OTHER): Payer: Medicare PPO | Admitting: Family Medicine

## 2019-06-16 ENCOUNTER — Encounter: Payer: Self-pay | Admitting: Family Medicine

## 2019-06-16 ENCOUNTER — Other Ambulatory Visit: Payer: Self-pay

## 2019-06-16 VITALS — BP 130/70 | HR 60 | Temp 97.9°F | Ht 69.0 in | Wt 183.2 lb

## 2019-06-16 DIAGNOSIS — Z7901 Long term (current) use of anticoagulants: Secondary | ICD-10-CM | POA: Diagnosis not present

## 2019-06-16 DIAGNOSIS — I482 Chronic atrial fibrillation, unspecified: Secondary | ICD-10-CM

## 2019-06-16 DIAGNOSIS — I480 Paroxysmal atrial fibrillation: Secondary | ICD-10-CM

## 2019-06-16 LAB — COAGUCHEK XS/INR WAIVED
INR: 2.6 — ABNORMAL HIGH (ref 0.9–1.1)
Prothrombin Time: 31.2 s

## 2019-06-16 NOTE — Progress Notes (Signed)
Subjective:  Patient ID: Joshua [name], male    DOB: June 08, 1945  Age: 74 y.o. MRN: [id number]  CC: Anticoagulation   HPI [name] presents for Patient in for follow-up of atrial fibrillation. Patient denies any recent bouts of chest pain or palpitations. Additionally, patient is taking anticoagulants. Patient denies any recent excessive bleeding episodes including epistaxis, bleeding from the gums, genitalia, rectal bleeding or hematuria. Additionally there has been no excessive bruising.  He is scheduled for oral surgery in a couple of days.  By history if he can keep his INR at 2.5 or below he was told they would proceed.  They do not want to stop his Coumadin.  Depression screen [facility] 2/9 06/16/2019 05/11/2019 03/23/2019  Decreased Interest 0 0 0  Down, Depressed, Hopeless 0 0 0  PHQ - 2 Score 0 0 0  Altered sleeping - - -  Tired, decreased energy - - -  Change in appetite - - -  Feeling bad or failure about yourself  - - -  Trouble concentrating - - -  Moving slowly or fidgety/restless - - -  Suicidal thoughts - - -  PHQ-9 Score - - -  Some recent data might be hidden    History Joshua [name] has a past medical history of Acute lower GI bleeding, Atrial fibrillation ([place]), Cataract, Cirrhosis ([place]), COPD (chronic obstructive pulmonary disease) ([place]), Coronary artery disease, Gastritis (06-10-2006), Hard of hearing, Hiatal hernia (06-10-2006), HOH (hard of hearing), adenomatous colonic polyps (2005, 2013), Hypercholesteremia, Hypercholesteremia, Hypertension, Hypertr obst cardiomyop, Iron deficiency anemia, unspecified, LVH (left ventricular hypertrophy), Other B-complex deficiencies, Rheumatic fever, and Thrombocytopenia ([place]) (05/2008).   He has a past surgical history that includes Coronary angioplasty; Shoulder surgery; Cardioversion (May 2012); Cardiac catheterization (05/28/2005); Cardiac catheterization (08/11/2001); Cardiac catheterization (01/15/2001); Cardiovascular stress test (05/29/2011);  and transthoracic echocardiogram (05/29/2011).   His family history includes Asthma in his brother; CAD in his father and paternal uncle; Cancer in his maternal grandfather; Colon cancer in his maternal grandfather; Emphysema in his brother; Heart attack (age of onset: 38) in his brother; Heart disease in his mother; Hyperlipidemia in his father; Hypertension in his sister; Lung cancer in his brother; Stroke in his mother and paternal uncle.He reports that he quit smoking about 34 years ago. His smoking use included cigarettes. He has a 10.00 pack-year smoking history. He has never used smokeless tobacco. He reports that he does not drink alcohol or use drugs.    ROS Review of Systems  Constitutional: Negative for fever.  Respiratory: Negative for shortness of breath.   Cardiovascular: Negative for chest pain.  Musculoskeletal: Negative for arthralgias.  Skin: Negative for rash.    Objective:  BP 130/70   Pulse 60   Temp 97.9 F (36.6 C) (Temporal)   Ht 5' 9"  (1.753 m)   Wt 183 lb 3.2 oz (83.1 kg)   BMI 27.05 kg/m   BP Readings from Last 3 Encounters:  06/16/19 130/70  06/12/19 (!) 102/52  05/11/19 (!) 110/46    Wt Readings from Last 3 Encounters:  06/16/19 183 lb 3.2 oz (83.1 kg)  06/12/19 182 lb 3.2 oz (82.6 kg)  05/11/19 182 lb (82.6 kg)     Physical Exam Vitals reviewed.  Constitutional:      Appearance: He is well-developed.  HENT:     Head: Normocephalic and atraumatic.     Right Ear: External ear normal.     Left Ear: External ear normal.     Mouth/Throat:  Pharynx: No oropharyngeal exudate or posterior oropharyngeal erythema.  Eyes:     Pupils: Pupils are equal, round, and reactive to light.  Cardiovascular:     Rate and Rhythm: Normal rate and regular rhythm.     Heart sounds: No murmur.  Pulmonary:     Effort: No respiratory distress.     Breath sounds: Normal breath sounds.  Musculoskeletal:     Cervical back: Normal range of motion and neck  supple.  Neurological:     Mental Status: He is alert and oriented to person, place, and time.     Results for orders placed or performed in visit on 06/16/19  CoaguChek XS/INR Waived  Result Value Ref Range   INR 2.6 (H) 0.9 - 1.1   Prothrombin Time 31.2 sec     Assessment & Plan:   Jontez was seen today for anticoagulation.  Diagnoses and all orders for this visit:  Chronic atrial fibrillation ([place]) -     CoaguChek XS/INR Waived -     BMP8+EGFR  Long term (current) use of anticoagulants -     CoaguChek XS/INR Waived -     BMP8+EGFR  PAF (paroxysmal atrial fibrillation) (HCC) -     CoaguChek XS/INR Waived -     BMP8+EGFR       I am having Lilian Kapur. Sauls maintain his acetaminophen, cyanocobalamin, cholecalciferol, polyethylene glycol, disopyramide, rosuvastatin, aspirin EC, hydrochlorothiazide, losartan, omeprazole, warfarin, tamsulosin, and furosemide.  Allergies as of 06/16/2019      Reactions   Tetracycline Other (See Comments)   Issue with stomach lining - can take with food if needed      Medication List       Accurate as of June 16, 2019 11:20 PM. If you have any questions, ask your nurse or doctor.        acetaminophen 325 MG tablet Commonly known as: TYLENOL Take 650 mg by mouth as needed.   aspirin EC 81 MG tablet Take 81 mg by mouth daily.   cholecalciferol 1000 units tablet Commonly known as: VITAMIN D Take 1,000 Units by mouth daily.   cyanocobalamin 1000 MCG tablet Commonly known as: CVS VITAMIN B12 Take 1 tablet (1,000 mcg total) by mouth daily.   disopyramide 150 MG capsule Commonly known as: NORPACE Take 2 capsules (300 mg total) by mouth 2 (two) times daily.   furosemide 20 MG tablet Commonly known as: LASIX Take 1 tablet (20 mg total) by mouth daily.   hydrochlorothiazide 25 MG tablet Commonly known as: HYDRODIURIL Take 0.5 tablets (12.5 mg total) by mouth as needed.   losartan 50 MG tablet Commonly known as: COZAAR Take  50 mg by mouth daily.   omeprazole 10 MG capsule Commonly known as: PRILOSEC Take 1 capsule (10 mg total) by mouth daily.   polyethylene glycol 17 g packet Commonly known as: MIRALAX / GLYCOLAX Take 17 g by mouth every other day.   rosuvastatin 10 MG tablet Commonly known as: CRESTOR Take 1 tablet (10 mg total) by mouth daily.   tamsulosin 0.4 MG Caps capsule Commonly known as: FLOMAX Take 1 capsule (0.4 mg total) by mouth daily.   warfarin 4 MG tablet Commonly known as: Jantoven Take as directed by the anticoagulation clinic. If you are unsure how to take this medication, talk to your nurse or doctor. Original instructions: TAKE 1 TABLET BY MOUTH DAILY, EXCEPT ON TUES ANDFRIDAY TAKE 1 AND 1/2TAB        Follow-up: Return in about 1 month (  around 07/16/2019).  Claretta Fraise, M.D.

## 2019-06-25 ENCOUNTER — Ambulatory Visit: Payer: Medicare PPO | Admitting: Family Medicine

## 2019-06-29 ENCOUNTER — Encounter: Payer: Self-pay | Admitting: Family Medicine

## 2019-06-29 DIAGNOSIS — M7989 Other specified soft tissue disorders: Secondary | ICD-10-CM

## 2019-06-29 MED ORDER — FUROSEMIDE 20 MG PO TABS: 20.0000 mg | ORAL_TABLET | Freq: Every day | ORAL | 0 refills | Status: DC

## 2019-07-01 ENCOUNTER — Ambulatory Visit (HOSPITAL_COMMUNITY): Payer: Medicare PPO | Attending: Cardiology

## 2019-07-01 ENCOUNTER — Other Ambulatory Visit: Payer: Self-pay

## 2019-07-01 DIAGNOSIS — I4891 Unspecified atrial fibrillation: Secondary | ICD-10-CM | POA: Insufficient documentation

## 2019-07-01 DIAGNOSIS — I421 Obstructive hypertrophic cardiomyopathy: Secondary | ICD-10-CM | POA: Insufficient documentation

## 2019-07-01 DIAGNOSIS — I2510 Atherosclerotic heart disease of native coronary artery without angina pectoris: Secondary | ICD-10-CM | POA: Diagnosis not present

## 2019-07-01 DIAGNOSIS — I480 Paroxysmal atrial fibrillation: Secondary | ICD-10-CM | POA: Insufficient documentation

## 2019-07-29 ENCOUNTER — Encounter: Payer: Self-pay | Admitting: Family Medicine

## 2019-07-29 ENCOUNTER — Ambulatory Visit: Payer: Medicare PPO | Admitting: Family Medicine

## 2019-07-29 ENCOUNTER — Other Ambulatory Visit: Payer: Self-pay

## 2019-07-29 VITALS — BP 141/66 | HR 53 | Temp 97.3°F | Ht 69.0 in | Wt 183.0 lb

## 2019-07-29 DIAGNOSIS — I482 Chronic atrial fibrillation, unspecified: Secondary | ICD-10-CM

## 2019-07-29 DIAGNOSIS — Z7901 Long term (current) use of anticoagulants: Secondary | ICD-10-CM

## 2019-07-29 DIAGNOSIS — I480 Paroxysmal atrial fibrillation: Secondary | ICD-10-CM | POA: Diagnosis not present

## 2019-07-29 LAB — COAGUCHEK XS/INR WAIVED
INR: 2.2 — ABNORMAL HIGH (ref 0.9–1.1)
Prothrombin Time: 25.8 s

## 2019-07-29 NOTE — Progress Notes (Signed)
BP (!) 141/66   Pulse (!) 53   Temp (!) 97.3 F (36.3 C)   Ht 5\' 9"  (1.753 m)   Wt 183 lb (83 kg)   SpO2 96%   BMI 27.02 kg/m    Subjective:   Patient ID: Joshua [name], male    DOB: 08-23-1945, 74 y.o.   MRN: [id number]  HPI: Joshua [name] is a 74 y.o. male presenting on 07/29/2019 for Medical Management of Chronic Issues and Atrial Fibrillation   HPI Coumadin recheck Target goal: 2.0-3.0 Reason on anticoagulation: Chronic A. fib Patient denies any bruising or bleeding or chest pain or palpitations, patient has been feeling that his heart rate is regular again and has been having some increased exertional dyspnea especially walking up hills.  He had to discuss with cardiologist and they are monitoring and considering another ablation in the future.  Relevant past medical, surgical, family and social history reviewed and updated as indicated. Interim medical history since our last visit reviewed. Allergies and medications reviewed and updated.  Review of Systems  Constitutional: Negative for chills and fever.  Respiratory: Positive for shortness of breath. Negative for wheezing.   Cardiovascular: Negative for chest pain, palpitations and leg swelling.  Musculoskeletal: Negative for back pain and gait problem.  Skin: Negative for rash.  Neurological: Negative for dizziness, weakness and numbness.  All other systems reviewed and are negative.   Per HPI unless specifically indicated above   Allergies as of 07/29/2019      Reactions   Tetracycline Other (See Comments)   Issue with stomach lining - can take with food if needed      Medication List       Accurate as of July 29, 2019  1:56 PM. If you have any questions, ask your nurse or doctor.        acetaminophen 325 MG tablet Commonly known as: TYLENOL Take 650 mg by mouth as needed.   aspirin EC 81 MG tablet Take 81 mg by mouth daily.   cholecalciferol 1000 units tablet Commonly known as: VITAMIN D Take 1,000  Units by mouth daily.   cyanocobalamin 1000 MCG tablet Commonly known as: CVS VITAMIN B12 Take 1 tablet (1,000 mcg total) by mouth daily.   disopyramide 150 MG capsule Commonly known as: NORPACE Take 2 capsules (300 mg total) by mouth 2 (two) times daily.   furosemide 20 MG tablet Commonly known as: LASIX Take 1 tablet (20 mg total) by mouth daily.   hydrochlorothiazide 25 MG tablet Commonly known as: HYDRODIURIL Take 0.5 tablets (12.5 mg total) by mouth as needed.   losartan 50 MG tablet Commonly known as: COZAAR Take 50 mg by mouth daily.   omeprazole 10 MG capsule Commonly known as: PRILOSEC Take 1 capsule (10 mg total) by mouth daily.   polyethylene glycol 17 g packet Commonly known as: MIRALAX / GLYCOLAX Take 17 g by mouth every other day.   rosuvastatin 10 MG tablet Commonly known as: CRESTOR Take 1 tablet (10 mg total) by mouth daily.   tamsulosin 0.4 MG Caps capsule Commonly known as: FLOMAX Take 1 capsule (0.4 mg total) by mouth daily.   warfarin 4 MG tablet Commonly known as: Jantoven Take as directed by the anticoagulation clinic. If you are unsure how to take this medication, talk to your nurse or doctor. Original instructions: TAKE 1 TABLET BY MOUTH DAILY, EXCEPT ON TUES ANDFRIDAY TAKE 1 AND 1/2TAB        Objective:  BP (!) 141/66   Pulse (!) 53   Temp (!) 97.3 F (36.3 C)   Ht 5\' 9"  (1.753 m)   Wt 183 lb (83 kg)   SpO2 96%   BMI 27.02 kg/m   Wt Readings from Last 3 Encounters:  07/29/19 183 lb (83 kg)  06/16/19 183 lb 3.2 oz (83.1 kg)  06/12/19 182 lb 3.2 oz (82.6 kg)    Physical Exam Vitals and nursing note reviewed.  Constitutional:      General: He is not in acute distress.    Appearance: He is well-developed. He is not diaphoretic.  Eyes:     General: No scleral icterus.    Conjunctiva/sclera: Conjunctivae normal.  Neck:     Thyroid: No thyromegaly.  Cardiovascular:     Rate and Rhythm: Normal rate and regular rhythm.      Heart sounds: Normal heart sounds. No murmur.  Pulmonary:     Effort: Pulmonary effort is normal. No respiratory distress.     Breath sounds: Normal breath sounds. No wheezing.  Musculoskeletal:        General: Normal range of motion.     Cervical back: Neck supple.  Lymphadenopathy:     Cervical: No cervical adenopathy.  Skin:    General: Skin is warm and dry.     Findings: No rash.  Neurological:     Mental Status: He is alert and oriented to person, place, and time.     Coordination: Coordination normal.  Psychiatric:        Behavior: Behavior normal.     Description   continue taking 1 1/2 tablets on Mondays, Thursdays, and 1 tablet all other days of the week.   INR today is 2.2 today ( goal is 2.0-3.0)  Perfect reading today Return in 6-8 weeks      Assessment & Plan:   Problem List Items Addressed This Visit      Cardiovascular and Mediastinum   FIBRILLATION, ATRIAL - Primary   Relevant Orders   CoaguChek XS/INR Waived   PAF (paroxysmal atrial fibrillation) ([place])     Other   Long term (current) use of anticoagulants [Z79.01]      Patient has some increased exertional dyspnea over the past few months, he is following up with his cardiologist but is more into a flutter now and that may be the cause of why he is having this. Follow up plan: Return if symptoms worsen or fail to improve, for 6 to 8-week INR recheck and hyperlipidemia and blood work.  Counseling provided for all of the vaccine components Orders Placed This Encounter  Procedures  . CoaguChek XS/INR [place], MD [facility] 07/29/2019, 1:56 PM

## 2019-08-06 ENCOUNTER — Encounter: Payer: Self-pay | Admitting: Family Medicine

## 2019-08-06 DIAGNOSIS — M7989 Other specified soft tissue disorders: Secondary | ICD-10-CM

## 2019-08-07 MED ORDER — FUROSEMIDE 20 MG PO TABS: 20.0000 mg | ORAL_TABLET | Freq: Every day | ORAL | 1 refills | Status: DC

## 2019-08-07 NOTE — Addendum Note (Signed)
Addended by: Caryl Pina on: 08/07/2019 09:07 AM   Modules accepted: Orders

## 2019-09-06 ENCOUNTER — Encounter: Payer: Self-pay | Admitting: Family Medicine

## 2019-09-06 DIAGNOSIS — I480 Paroxysmal atrial fibrillation: Secondary | ICD-10-CM

## 2019-09-09 ENCOUNTER — Other Ambulatory Visit: Payer: Self-pay

## 2019-09-09 ENCOUNTER — Other Ambulatory Visit: Payer: Medicare PPO

## 2019-09-09 DIAGNOSIS — I480 Paroxysmal atrial fibrillation: Secondary | ICD-10-CM

## 2019-09-09 LAB — BMP8+EGFR
BUN/Creatinine Ratio: 21 (ref 10–24)
BUN: 22 mg/dL (ref 8–27)
CO2: 35 mmol/L — ABNORMAL HIGH (ref 20–29)
Calcium: 8.9 mg/dL (ref 8.6–10.2)
Chloride: 97 mmol/L (ref 96–106)
Creatinine, Ser: 1.06 mg/dL (ref 0.76–1.27)
GFR calc Af Amer: 80 mL/min/{1.73_m2} (ref >59)
GFR calc non Af Amer: 69 mL/min/{1.73_m2} (ref >59)
Glucose: 72 mg/dL (ref 65–99)
Potassium: 4.3 mmol/L (ref 3.5–5.2)
Sodium: 138 mmol/L (ref 134–144)

## 2019-09-10 ENCOUNTER — Encounter: Payer: Self-pay | Admitting: Family Medicine

## 2019-09-10 ENCOUNTER — Ambulatory Visit: Payer: Medicare PPO | Admitting: Family Medicine

## 2019-09-10 VITALS — BP 109/62 | HR 74 | Temp 98.2°F | Ht 69.0 in | Wt 177.0 lb

## 2019-09-10 DIAGNOSIS — I480 Paroxysmal atrial fibrillation: Secondary | ICD-10-CM

## 2019-09-10 DIAGNOSIS — Z7901 Long term (current) use of anticoagulants: Secondary | ICD-10-CM

## 2019-09-10 LAB — COAGUCHEK XS/INR WAIVED
INR: 2.3 — ABNORMAL HIGH (ref 0.9–1.1)
Prothrombin Time: 27.7 s

## 2019-09-10 NOTE — Progress Notes (Signed)
   BP 109/62   Pulse 74   Temp 98.2 F (36.8 C)   Ht 5\' 9"  (1.753 m)   Wt 177 lb (80.3 kg)   SpO2 94%   BMI 26.14 kg/m    Subjective:   Patient ID: Joshua [name], male    DOB: Jul 07, 1945, 74 y.o.   MRN: [id number]  HPI: Joshua [name] is a 74 y.o. male presenting on 09/10/2019 for Medical Management of Chronic Issues and Atrial Fibrillation   HPI Coumadin recheck Target goal: 2.0-3.0 Reason on anticoagulation: Paroxysmal A. fib Patient denies any bruising or bleeding or chest pain or palpitations   Relevant past medical, surgical, family and social history reviewed and updated as indicated. Interim medical history since our last visit reviewed. Allergies and medications reviewed and updated.  Review of Systems  Constitutional: Negative for chills and fever.  Respiratory: Negative for shortness of breath and wheezing.   Cardiovascular: Negative for chest pain and leg swelling.  Gastrointestinal: Negative for blood in stool.  Genitourinary: Negative for hematuria.  Musculoskeletal: Negative for back pain and gait problem.  Skin: Negative for rash.  All other systems reviewed and are negative.   Per HPI unless specifically indicated above      Objective:   BP 109/62   Pulse 74   Temp 98.2 F (36.8 C)   Ht 5\' 9"  (1.753 m)   Wt 177 lb (80.3 kg)   SpO2 94%   BMI 26.14 kg/m   Wt Readings from Last 3 Encounters:  09/10/19 177 lb (80.3 kg)  07/29/19 183 lb (83 kg)  06/16/19 183 lb 3.2 oz (83.1 kg)    Physical Exam Vitals and nursing note reviewed.  Constitutional:      General: He is not in acute distress.    Appearance: He is well-developed. He is not diaphoretic.  Eyes:     General: No scleral icterus.    Conjunctiva/sclera: Conjunctivae normal.  Skin:    General: Skin is warm and dry.     Findings: No rash.  Neurological:     Mental Status: He is alert and oriented to person, place, and time.     Coordination: Coordination normal.  Psychiatric:         Behavior: Behavior normal.       Assessment & Plan:   Problem List Items Addressed This Visit      Cardiovascular and Mediastinum   FIBRILLATION, ATRIAL   PAF (paroxysmal atrial fibrillation) ([place])     Other   Long term (current) use of anticoagulants [Z79.01] - Primary   Relevant Orders   CoaguChek XS/INR Waived      Description   continue taking 1 1/2 tablets on Mondays, Thursdays, and 1 tablet all other days of the week.   INR today is 2.3 today ( goal is 2.0-3.0)  Perfect reading today Return in 6-8 weeks     Follow up plan: Return if symptoms worsen or fail to improve, for 6 to 8-week INR recheck.  Counseling provided for all of the vaccine components Orders Placed This Encounter  Procedures  . CoaguChek XS/INR Ker[place], MD [facility] 09/10/2019, 11:22 AM

## 2019-09-25 ENCOUNTER — Telehealth: Payer: Self-pay | Admitting: Cardiovascular Disease

## 2019-09-25 ENCOUNTER — Other Ambulatory Visit: Payer: Self-pay

## 2019-09-25 DIAGNOSIS — I4891 Unspecified atrial fibrillation: Secondary | ICD-10-CM

## 2019-09-25 DIAGNOSIS — I421 Obstructive hypertrophic cardiomyopathy: Secondary | ICD-10-CM

## 2019-09-25 DIAGNOSIS — I2510 Atherosclerotic heart disease of native coronary artery without angina pectoris: Secondary | ICD-10-CM

## 2019-09-25 DIAGNOSIS — I480 Paroxysmal atrial fibrillation: Secondary | ICD-10-CM

## 2019-09-25 DIAGNOSIS — I4892 Unspecified atrial flutter: Secondary | ICD-10-CM

## 2019-09-25 NOTE — Telephone Encounter (Signed)
Spoke with ma at [facility], the patient was referred to them from the [place] due to new right eye hemorrhage. He reports according to the notes he got from the [place] they reported new emboli. He was not able to see anything today the blood is obscuring his view. He would like the patient to have a CVA workup to make sure nothing new is going on. Will forward to dr Claiborne Billings for okay to order echo and carotid dopplers.

## 2019-09-25 NOTE — Telephone Encounter (Signed)
Orders placed.

## 2019-09-25 NOTE — Telephone Encounter (Signed)
New Message   [place] with [facility] is calling because the patient is there today and she is aware that he has an appt on 10/06/19. She is calling because she states that the provider there wants the patient to have a carotid study and echocardiogram. I did advise her to fax over the order and she said they do not order test. That only the PCP or specialist order test. So they are asking that Dr. Claiborne Billings order the test for the patient. Please call to discuss.

## 2019-09-25 NOTE — Telephone Encounter (Signed)
Okay to order echo and carotid Doppler studies

## 2019-09-28 ENCOUNTER — Telehealth: Payer: Self-pay | Admitting: Cardiovascular Disease

## 2019-09-28 ENCOUNTER — Other Ambulatory Visit: Payer: Self-pay

## 2019-09-28 DIAGNOSIS — I480 Paroxysmal atrial fibrillation: Secondary | ICD-10-CM

## 2019-09-28 DIAGNOSIS — I4891 Unspecified atrial fibrillation: Secondary | ICD-10-CM

## 2019-09-28 DIAGNOSIS — I421 Obstructive hypertrophic cardiomyopathy: Secondary | ICD-10-CM

## 2019-09-28 DIAGNOSIS — I4892 Unspecified atrial flutter: Secondary | ICD-10-CM

## 2019-09-28 DIAGNOSIS — I2510 Atherosclerotic heart disease of native coronary artery without angina pectoris: Secondary | ICD-10-CM

## 2019-09-28 NOTE — Telephone Encounter (Signed)
Follow Up:      Pt said he was calling back, but did not know who called him.

## 2019-09-28 NOTE — Telephone Encounter (Signed)
Called and spoke with pt, notified I was not sure who called him but suggested it may have been someone calling to schedule his carotid doppler. Notified that his eye doctor wanted him to have CVA work up and this included an echo and carotid dopplers to make sure nothing had changed. Pt verbalized understanding. Pt reports if they call within the next 45 minutes he will be able to answer, if not please call him tomorrow between 8am-11am. No other questions at this time.

## 2019-10-05 ENCOUNTER — Telehealth: Payer: Self-pay

## 2019-10-05 NOTE — Telephone Encounter (Signed)
Joshua [name] is requesting cardiac clearance for vitrectomy, endolaser and intravitreal Eylea procedure.  You were seen in clinic on 10/06/2019.  I will defer his cardiac clearance to you.  Please forward your clinic note to the requesting office.  I will remove him from the preop pool.  Thank you.   Jossie Ng. Ariani Seier NP-C    10/05/2019, 1:46 PM [facility] [address] Office [phone] Fax [phone]

## 2019-10-05 NOTE — Telephone Encounter (Signed)
Patient with diagnosis of afib on warfarin for anticoagulation.    Procedure: Vitrectomy, Endolaser and Intravitrael Eylea  Date of procedure: TBD  CHADS2-VASc score of  5 (CHF, HTN, AGE, CAD, AGE)  Per office protocol, patient can hold warfarin for 5 days prior to procedure.   Patient will NOT need bridging with Lovenox (enoxaparin) around procedure.

## 2019-10-05 NOTE — Telephone Encounter (Signed)
   [facility] Pre-operative Risk Assessment     Request for surgical clearance:  1. What type of surgery is being performed? Vitrectomy, Endolaser and Intravitrael Eylea   2. When is this surgery scheduled? TBD  3. What type of clearance is required (medical clearance vs. Pharmacy clearance to hold med vs. Both)? Both  4. Are there any medications that need to be held prior to surgery and how long? Requesting to hold Aspirin 7 days prior to surgery and requesting to hold Warfarin 5 Days prior to surgery date   5. Practice name and name of physician performing surgery? [facility]- Dr. Lorrene Reid  6. What is the office phone number? [phone]   7.   What is the office fax number? [phone]  8.   Anesthesia type (None, local, MAC, general) ? MAC   Joshua [name] Joshua [name] 10/05/2019, 12:24 PM  _________________________________________________________________   (provider comments below)

## 2019-10-06 ENCOUNTER — Encounter: Payer: Self-pay | Admitting: Cardiovascular Disease

## 2019-10-06 ENCOUNTER — Ambulatory Visit: Payer: Medicare PPO | Admitting: Cardiovascular Disease

## 2019-10-06 ENCOUNTER — Other Ambulatory Visit: Payer: Self-pay

## 2019-10-06 VITALS — BP 128/60 | HR 52 | Ht 69.0 in | Wt 178.2 lb

## 2019-10-06 DIAGNOSIS — I4892 Unspecified atrial flutter: Secondary | ICD-10-CM

## 2019-10-06 DIAGNOSIS — I2510 Atherosclerotic heart disease of native coronary artery without angina pectoris: Secondary | ICD-10-CM | POA: Diagnosis not present

## 2019-10-06 DIAGNOSIS — I050 Rheumatic mitral stenosis: Secondary | ICD-10-CM | POA: Diagnosis not present

## 2019-10-06 DIAGNOSIS — I351 Nonrheumatic aortic (valve) insufficiency: Secondary | ICD-10-CM | POA: Diagnosis not present

## 2019-10-06 DIAGNOSIS — E785 Hyperlipidemia, unspecified: Secondary | ICD-10-CM | POA: Diagnosis not present

## 2019-10-06 DIAGNOSIS — Z7901 Long term (current) use of anticoagulants: Secondary | ICD-10-CM | POA: Diagnosis not present

## 2019-10-06 DIAGNOSIS — I421 Obstructive hypertrophic cardiomyopathy: Secondary | ICD-10-CM | POA: Diagnosis not present

## 2019-10-06 NOTE — Patient Instructions (Signed)
Medication Instructions:  CONTINUE WITH CURRENT MEDICATIONS. NO CHANGES.  *If you need a refill on your cardiac medications before your next appointment, please call your pharmacy*    Follow-Up: At [facility], you and your health needs are our priority.  As part of our continuing mission to provide you with exceptional heart care, we have created designated Provider Care Teams.  These Care Teams include your primary Cardiologist (physician) and Advanced Practice Providers (APPs -  Physician Assistants and Nurse Practitioners) who all work together to provide you with the care you need, when you need it.  We recommend signing up for the patient portal called "MyChart".  Sign up information is provided on this After Visit Summary.  MyChart is used to connect with patients for Virtual Visits (Telemedicine).  Patients are able to view lab/test results, encounter notes, upcoming appointments, etc.  Non-urgent messages can be sent to your provider as well.   To learn more about what you can do with MyChart, go to [email or web address].    Your next appointment:   3 month(s)  The format for your next appointment:   In Person  Provider:   Shelva Majestic, MD   Other Instructions AMBULATORY REFERRAL TO DR. ALLRED
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
## 2019-10-06 NOTE — Telephone Encounter (Signed)
[name] is in atrial flutter with variable block.  For this reason I do not recommend that he hold warfarin presently.  I have scheduled him an evaluation to see [name] who is seen in the past and I would defer eye surgery until he is stable cardiac wise and when warfarin can be held which is not presently.

## 2019-10-06 NOTE — Telephone Encounter (Signed)
Spoke with Joshua [name] and he is waiting on a call back from Dr. Jackalyn Lombard office, that someone had already called and said they would call him back at 2, but he is aware that he will need to await permission for him to have this procedure done.  Will route back over to the requesting surgeon's office to make them aware.

## 2019-10-06 NOTE — Telephone Encounter (Signed)
   Primary Cardiologist: Shelva Majestic, MD  Chart reviewed as part of pre-operative protocol coverage. Patient was contacted 10/06/2019 in reference to pre-operative risk assessment for pending surgery as outlined below.  Joshua [name] was last seen on 10/06/2019 by Dr Claiborne Billings.  Dr Claiborne Billings has noted the patient is back in atrial flutter and does not feel its appropriate to hold anticoagulation at this time.  The patient will be given an appointment with Dr Rayann Heman for further evaluation prior to clearance.   Pre-op covering staff: - Please schedule appointment with Dr Rayann Heman and call patient to inform them. If patient already had an upcoming appointment within acceptable timeframe, please add "pre-op clearance" to the appointment notes so provider is aware. - Please contact requesting surgeon's office via preferred method (i.e, phone, fax) to inform them of need for appointment prior to surgery.  Kerin Ransom, PA-C 10/06/2019, 1:39 PM

## 2019-10-08 ENCOUNTER — Encounter: Payer: Self-pay | Admitting: Cardiovascular Disease

## 2019-10-08 ENCOUNTER — Other Ambulatory Visit: Payer: Self-pay

## 2019-10-08 ENCOUNTER — Ambulatory Visit (HOSPITAL_COMMUNITY): Payer: Medicare PPO | Attending: Cardiology

## 2019-10-08 DIAGNOSIS — I2510 Atherosclerotic heart disease of native coronary artery without angina pectoris: Secondary | ICD-10-CM | POA: Diagnosis not present

## 2019-10-08 DIAGNOSIS — I4891 Unspecified atrial fibrillation: Secondary | ICD-10-CM | POA: Insufficient documentation

## 2019-10-08 DIAGNOSIS — I480 Paroxysmal atrial fibrillation: Secondary | ICD-10-CM | POA: Diagnosis not present

## 2019-10-08 DIAGNOSIS — I421 Obstructive hypertrophic cardiomyopathy: Secondary | ICD-10-CM | POA: Diagnosis not present

## 2019-10-08 DIAGNOSIS — I4892 Unspecified atrial flutter: Secondary | ICD-10-CM | POA: Diagnosis not present

## 2019-10-08 LAB — ECHOCARDIOGRAM COMPLETE
AR max vel: 1.66 cm2
AV Area VTI: 1.59 cm2
AV Area mean vel: 1.82 cm2
AV Mean grad: 7.5 mmHg
AV Peak grad: 14.6 mmHg
Ao pk vel: 1.91 m/s
Area-P 1/2: 2.82 cm2
S' Lateral: 2.4 cm

## 2019-10-15 ENCOUNTER — Other Ambulatory Visit: Payer: Self-pay

## 2019-10-15 ENCOUNTER — Ambulatory Visit: Payer: Medicare PPO | Admitting: Internal Medicine

## 2019-10-15 ENCOUNTER — Encounter: Payer: Self-pay | Admitting: Internal Medicine

## 2019-10-15 VITALS — BP 142/70 | HR 68 | Ht 69.0 in | Wt 174.2 lb

## 2019-10-15 DIAGNOSIS — I4819 Other persistent atrial fibrillation: Secondary | ICD-10-CM

## 2019-10-15 DIAGNOSIS — I421 Obstructive hypertrophic cardiomyopathy: Secondary | ICD-10-CM | POA: Diagnosis not present

## 2019-10-15 NOTE — Progress Notes (Signed)
Electrophysiology Office Note   Date:  10/15/2019   ID:  Joshua [name], DOB 1945-06-20, MRN [id number]  PCP:  [name], [name], MD  Cardiologist:  Dr Claiborne Billings Primary Electrophysiologist: Thompson Grayer, MD    CC: afib   History of Present Illness: Joshua [name] is a 74 y.o. male who presents today for electrophysiology evaluation.   The patient is referred for EP consultation regarding atrial fibrillation. He has h/o rheumatic fever, atrial enlargement, HCM, and cirrhosis.  He underwent CTI ablation by me in 2012 (my note reviewed).  afib has been controlled with norpace.  He has chronic SOB on inclines (at least 2 years).  He has recently developed afib.  He is not certain as to whether he feels worse or not. He is active for his age. Today, he denies symptoms of palpitations, chest pain, shortness of breath at est, orthopnea, PND, lower extremity edema,  dizziness, presyncope, syncope, bleeding, or neurologic sequela. The patient is tolerating medications without difficulties and is otherwise without complaint today.    Past Medical History:  Diagnosis Date   Acute lower GI bleeding    related to gastritis / viral infection   Cataract    Cirrhosis ([place])    COPD (chronic obstructive pulmonary disease) ([place])    PFT 05-05-2010, FEV1 .9 (26%) ratio 60   Coronary artery disease    Gastritis 06-10-2006   Hiatal hernia 06-10-2006   EGD   HOH (hard of hearing)    Hx of adenomatous colonic polyps 2005, 2013   Colonoscopy   Hypercholesteremia    Hypertension    Hypertr obst cardiomyop    mild subaortic stenosis   Iron deficiency anemia, unspecified    LVH (left ventricular hypertrophy)    Other B-complex deficiencies    Persistent atrial fibrillation (HCC)    persistent   Rheumatic fever    Thrombocytopenia ([place]) 05/2008   Past Surgical History:  Procedure Laterality Date   CARDIAC CATHETERIZATION  05/28/2005   Widely patent coronaries and normal LV function.    CARDIAC CATHETERIZATION  08/11/2001   80% LAD stenosis successfully stented with a 3.5x1mm Cypher DES postdilated to 3.45mm resulting in reduction of 80% to 0%.   CARDIAC CATHETERIZATION  01/15/2001   Focal 95% proximal RCA stenosis, stented with a 3x68mm Zeta multilink stent with postdilatation utilizing a 3.5x81mm Quantum balloon with stenosis being reduced to 0%.   CARDIOVASCULAR STRESS TEST  05/29/2011   No scintigraphic evidence of inducible myocardial ischemia. No ECG changes. EKG negative for ischemia.   CARDIOVERSION  May 2012   [name]   CORONARY ANGIOPLASTY     2002-2003   SHOULDER SURGERY     TRANSTHORACIC ECHOCARDIOGRAM  05/29/2011   EF >70%, severe concentric LV hypertrophy, severe mitral annular calcification, mild-moderate aortic regurg,      Current Outpatient Medications  Medication Sig Dispense Refill   acetaminophen (TYLENOL) 325 MG tablet Take 650 mg by mouth as needed.      aspirin EC 81 MG tablet Take 81 mg by mouth daily.     cholecalciferol (VITAMIN D) 1000 units tablet Take 1,000 Units by mouth daily.     cyanocobalamin ([employer] VITAMIN B12) 1000 MCG tablet Take 1 tablet (1,000 mcg total) by mouth daily.     disopyramide (NORPACE) 150 MG capsule Take 2 capsules (300 mg total) by mouth 2 (two) times daily. 360 capsule 3   furosemide (LASIX) 20 MG tablet Take 20 mg by mouth as needed.  hydrochlorothiazide (HYDRODIURIL) 25 MG tablet Take 0.5 tablets (12.5 mg total) by mouth as needed. 90 tablet 3   losartan (COZAAR) 50 MG tablet Take 50 mg by mouth daily.     omeprazole (PRILOSEC) 10 MG capsule Take 1 capsule (10 mg total) by mouth daily. 90 capsule 3   polyethylene glycol (MIRALAX / GLYCOLAX) packet Take 17 g by mouth every other day.     rosuvastatin (CRESTOR) 10 MG tablet Take 1 tablet (10 mg total) by mouth daily. 90 tablet 3   tamsulosin (FLOMAX) 0.4 MG CAPS capsule Take 1 capsule (0.4 mg total) by mouth daily. 90 capsule 3   warfarin  (JANTOVEN) 4 MG tablet TAKE 1 TABLET BY MOUTH DAILY, EXCEPT ON TUES ANDFRIDAY TAKE 1 AND 1/2TAB 115 tablet 3   No current facility-administered medications for this visit.    Allergies:   Tetracycline   Social History:  The patient  reports that he quit smoking about 34 years ago. His smoking use included cigarettes. He has a 10.00 pack-year smoking history. He has never used smokeless tobacco. He reports that he does not drink alcohol and does not use drugs.   Family History:  The patient's  family history includes Asthma in his brother; CAD in his father and paternal uncle; Cancer in his maternal grandfather; Colon cancer in his maternal grandfather; Emphysema in his brother; Heart attack (age of onset: 42) in his brother; Heart disease in his mother; Hyperlipidemia in his father; Hypertension in his sister; Lung cancer in his brother; Stroke in his mother and paternal uncle.    ROS:  Please see the history of present illness.   All other systems are personally reviewed and negative.    PHYSICAL EXAM: VS:  BP (!) 142/70    Pulse 68    Ht 5\' 9"  (1.753 m)    Wt 174 lb 3.2 oz (79 kg)    SpO2 90%    BMI 25.72 kg/m  , BMI Body mass index is 25.72 kg/m. GEN: Well nourished, well developed, in no acute distress HEENT: normal except for reduced hearing Neck: no JVD  Cardiac: iRRR Respiratory:   normal work of breathing GI: soft,  MS: no deformity or atrophy Skin: warm and dry  Neuro:  Strength and sensation are intact Psych: euthymic mood, full affect  EKG:  EKG is ordered today. The ekg ordered today is personally reviewed and shows afib, V rates 60s This is coarse afib NOT atrial flutter.  Recent Labs: 06/12/2019: ALT 15; Hemoglobin 15.5; Magnesium 2.3; Platelets 127; TSH 2.410 09/09/2019: BUN 22; Creatinine, Ser 1.06; Potassium 4.3; Sodium 138  personally reviewed   Lipid Panel     Component Value Date/Time   CHOL 128 03/11/2019 0849   CHOL 126 08/20/2012 0930   TRIG 103  03/11/2019 0849   TRIG 113 10/31/2016 0850   TRIG 94 08/20/2012 0930   HDL 35 (L) 03/11/2019 0849   HDL 46 10/31/2016 0850   HDL 41 08/20/2012 0930   CHOLHDL 3.7 03/11/2019 0849   LDLCALC 74 03/11/2019 0849   LDLCALC 63 09/29/2013 0907   LDLCALC 66 08/20/2012 0930   personally reviewed   Wt Readings from Last 3 Encounters:  10/15/19 174 lb 3.2 oz (79 kg)  10/06/19 178 lb 3.2 oz (80.8 kg)  09/10/19 177 lb (80.3 kg)      Other studies personally reviewed: Additional studies/ records that were reviewed today include: Dr Ermalinda Memos notes, my prior notes, echo, ekgs  Review of the above  records today demonstrates: as above   ASSESSMENT AND PLAN:  1.  Persistent afib The patient has symptomatic, recurrent atrial fibrillation. he has failed medical therapy with norpace. Chads2vasc score is 4.  he is anticoagulated with coumadin. I worry about bleeding risks while on both ASA and coumadin given low platelets, advanced age, cirrhosis, and prior GI bleed.  We discussed at length today.  He says Dr Claiborne Billings would prefer that he stay on ASA.  I will defer. We discussed DOAC therapy at length.  I think that he should consider switching from coumadin to xarelto.  He wishes to discuss this with his [place] cardiologist as they pay for his medicines. Therapeutic strategies for afib including medicine (tikosyn, amiodarone) and ablation were discussed in detail with the patient today. Risk, benefits, and alternatives to each approach were discussed.  Given prior rheumatic fever, HCM and atrial enlargement, his anticipated results with ablation are low (probably 50-60% success).  He understands risks and wishes to avoid ablation currently. He would favor cardioversion and continuing norpace for now. Risks of cardioversion including risks of anesthesia and stroke were discussed at length.  He accepts risk and wishes to proceed. We will therefore plan cardioversion at the next available time with close outpatient  follow-up in the AF clinic.  Risks, benefits and potential toxicities for medications prescribed and/or refilled reviewed with patient today.     Current medicines are reviewed at length with the patient today.   The patient does not have concerns regarding his medicines.  The following changes were made today:  none  Labs/ tests ordered today include:  No orders of the defined types were placed in this encounter.    Army Fossa, MD  10/15/2019 8:49 AM     [place] [address] [place] [address] [phone] (office) [phone] (fax)

## 2019-10-15 NOTE — H&P (View-Only) (Signed)
Electrophysiology Office Note   Date:  10/15/2019   ID:  Joshua [name], DOB [birth year], MRN [id number]  PCP:  [name], Joshua Kaufmann, MD  Cardiologist:  Dr Claiborne Billings Primary Electrophysiologist: Joshua Grayer, MD    CC: afib   History of Present Illness: Joshua [name] is a 74 y.o. male who presents today for electrophysiology evaluation.   The patient is referred for EP consultation regarding atrial fibrillation. He has h/o rheumatic fever, atrial enlargement, HCM, and cirrhosis.  He underwent CTI ablation by me in 2012 (my note reviewed).  afib has been controlled with norpace.  He has chronic SOB on inclines (at least 2 years).  He has recently developed afib.  He is not certain as to whether he feels worse or not. He is active for his age. Today, he denies symptoms of palpitations, chest pain, shortness of breath at est, orthopnea, PND, lower extremity edema,  dizziness, presyncope, syncope, bleeding, or neurologic sequela. The patient is tolerating medications without difficulties and is otherwise without complaint today.    Past Medical History:  Diagnosis Date  . Acute lower GI bleeding    related to gastritis / viral infection  . Cataract   . Cirrhosis ([place])   . COPD (chronic obstructive pulmonary disease) ([place])    PFT 05-05-2010, FEV1 .9 (26%) ratio 60  . Coronary artery disease   . Gastritis 06-10-2006  . Hiatal hernia 06-10-2006   EGD  . HOH (hard of hearing)   . Hx of adenomatous colonic polyps 2005, 2013   Colonoscopy  . Hypercholesteremia   . Hypertension   . Hypertr obst cardiomyop    mild subaortic stenosis  . Iron deficiency anemia, unspecified   . LVH (left ventricular hypertrophy)   . Other B-complex deficiencies   . Persistent atrial fibrillation (HCC)    persistent  . Rheumatic fever   . Thrombocytopenia ([place]) 05/2008   Past Surgical History:  Procedure Laterality Date  . CARDIAC CATHETERIZATION  05/28/2005   Widely patent coronaries and normal LV function.  Marland Kitchen  CARDIAC CATHETERIZATION  08/11/2001   80% LAD stenosis successfully stented with a 3.5x3mm Cypher DES postdilated to 3.58mm resulting in reduction of 80% to 0%.  . CARDIAC CATHETERIZATION  01/15/2001   Focal 95% proximal RCA stenosis, stented with a 3x73mm Zeta multilink stent with postdilatation utilizing a 3.5x62mm Quantum balloon with stenosis being reduced to 0%.  . CARDIOVASCULAR STRESS TEST  05/29/2011   No scintigraphic evidence of inducible myocardial ischemia. No ECG changes. EKG negative for ischemia.  Marland Kitchen CARDIOVERSION  May 2012   [name]  . CORONARY ANGIOPLASTY     2002-2003  . SHOULDER SURGERY    . TRANSTHORACIC ECHOCARDIOGRAM  05/29/2011   EF >70%, severe concentric LV hypertrophy, severe mitral annular calcification, mild-moderate aortic regurg,      Current Outpatient Medications  Medication Sig Dispense Refill  . acetaminophen (TYLENOL) 325 MG tablet Take 650 mg by mouth as needed.     Marland Kitchen aspirin EC 81 MG tablet Take 81 mg by mouth daily.    . cholecalciferol (VITAMIN D) 1000 units tablet Take 1,000 Units by mouth daily.    . cyanocobalamin ([employer]S VITAMIN B12) 1000 MCG tablet Take 1 tablet (1,000 mcg total) by mouth daily.    . disopyramide (NORPACE) 150 MG capsule Take 2 capsules (300 mg total) by mouth 2 (two) times daily. 360 capsule 3  . furosemide (LASIX) 20 MG tablet Take 20 mg by mouth as needed.    Marland Kitchen  hydrochlorothiazide (HYDRODIURIL) 25 MG tablet Take 0.5 tablets (12.5 mg total) by mouth as needed. 90 tablet 3  . losartan (COZAAR) 50 MG tablet Take 50 mg by mouth daily.    Marland Kitchen omeprazole (PRILOSEC) 10 MG capsule Take 1 capsule (10 mg total) by mouth daily. 90 capsule 3  . polyethylene glycol (MIRALAX / GLYCOLAX) packet Take 17 g by mouth every other day.    . rosuvastatin (CRESTOR) 10 MG tablet Take 1 tablet (10 mg total) by mouth daily. 90 tablet 3  . tamsulosin (FLOMAX) 0.4 MG CAPS capsule Take 1 capsule (0.4 mg total) by mouth daily. 90 capsule 3  . warfarin  (JANTOVEN) 4 MG tablet TAKE 1 TABLET BY MOUTH DAILY, EXCEPT ON TUES ANDFRIDAY TAKE 1 AND 1/2TAB 115 tablet 3   No current facility-administered medications for this visit.    Allergies:   Tetracycline   Social History:  The patient  reports that he quit smoking about 34 years ago. His smoking use included cigarettes. He has a 10.00 pack-year smoking history. He has never used smokeless tobacco. He reports that he does not drink alcohol and does not use drugs.   Family History:  The patient's  family history includes Asthma in his brother; CAD in his father and paternal uncle; Cancer in his maternal grandfather; Colon cancer in his maternal grandfather; Emphysema in his brother; Heart attack (age of onset: 49) in his brother; Heart disease in his mother; Hyperlipidemia in his father; Hypertension in his sister; Lung cancer in his brother; Stroke in his mother and paternal uncle.    ROS:  Please see the history of present illness.   All other systems are personally reviewed and negative.    PHYSICAL EXAM: VS:  BP (!) 142/70   Pulse 68   Ht 5\' 9"  (1.753 m)   Wt 174 lb 3.2 oz (79 kg)   SpO2 90%   BMI 25.72 kg/m  , BMI Body mass index is 25.72 kg/m. GEN: Well nourished, well developed, in no acute distress HEENT: normal except for reduced hearing Neck: no JVD  Cardiac: iRRR Respiratory:   normal work of breathing GI: soft,  MS: no deformity or atrophy Skin: warm and dry  Neuro:  Strength and sensation are intact Psych: euthymic mood, full affect  EKG:  EKG is ordered today. The ekg ordered today is personally reviewed and shows afib, V rates 60s This is coarse afib NOT atrial flutter.  Recent Labs: 06/12/2019: ALT 15; Hemoglobin 15.5; Magnesium 2.3; Platelets 127; TSH 2.410 09/09/2019: BUN 22; Creatinine, Ser 1.06; Potassium 4.3; Sodium 138  personally reviewed   Lipid Panel     Component Value Date/Time   CHOL 128 03/11/2019 0849   CHOL 126 08/20/2012 0930   TRIG 103  03/11/2019 0849   TRIG 113 10/31/2016 0850   TRIG 94 08/20/2012 0930   HDL 35 (L) 03/11/2019 0849   HDL 46 10/31/2016 0850   HDL 41 08/20/2012 0930   CHOLHDL 3.7 03/11/2019 0849   LDLCALC 74 03/11/2019 0849   LDLCALC 63 09/29/2013 0907   LDLCALC 66 08/20/2012 0930   personally reviewed   Wt Readings from Last 3 Encounters:  10/15/19 174 lb 3.2 oz (79 kg)  10/06/19 178 lb 3.2 oz (80.8 kg)  09/10/19 177 lb (80.3 kg)      Other studies personally reviewed: Additional studies/ records that were reviewed today include: Dr Ermalinda Memos notes, my prior notes, echo, ekgs  Review of the above records today demonstrates: as above  ASSESSMENT AND PLAN:  1.  Persistent afib The patient has symptomatic, recurrent atrial fibrillation. he has failed medical therapy with norpace. Chads2vasc score is 4.  he is anticoagulated with coumadin. I worry about bleeding risks while on both ASA and coumadin given low platelets, advanced age, cirrhosis, and prior GI bleed.  We discussed at length today.  He says Dr Claiborne Billings would prefer that he stay on ASA.  I will defer. We discussed DOAC therapy at length.  I think that he should consider switching from coumadin to xarelto.  He wishes to discuss this with his [place] cardiologist as they pay for his medicines. Therapeutic strategies for afib including medicine (tikosyn, amiodarone) and ablation were discussed in detail with the patient today. Risk, benefits, and alternatives to each approach were discussed.  Given prior rheumatic fever, HCM and atrial enlargement, his anticipated results with ablation are low (probably 50-60% success).  He understands risks and wishes to avoid ablation currently. He would favor cardioversion and continuing norpace for now. Risks of cardioversion including risks of anesthesia and stroke were discussed at length.  He accepts risk and wishes to proceed. We will therefore plan cardioversion at the next available time with close outpatient  follow-up in the AF clinic.  Risks, benefits and potential toxicities for medications prescribed and/or refilled reviewed with patient today.     Current medicines are reviewed at length with the patient today.   The patient does not have concerns regarding his medicines.  The following changes were made today:  none  Labs/ tests ordered today include:  No orders of the defined types were placed in this encounter.    Army Fossa, MD  10/15/2019 8:49 AM     [address] [place] [address] [phone] (office) [phone] (fax)

## 2019-10-15 NOTE — Patient Instructions (Signed)
Medication Instructions:  *If you need a refill on your cardiac medications before your next appointment, please call your pharmacy*  Lab Work: You will need to have a pre-procedure Covid Screening on 10/16/19 at 1:50 pm. If you have labs (blood work) drawn today and your tests are completely normal, you will receive your results only by: Marland Kitchen MyChart Message (if you have MyChart) OR . A paper copy in the mail If you have any lab test that is abnormal or we need to change your treatment, we will call you to review the results.  Testing/Procedures: Your physician has recommended that you have a Cardioversion (DCCV). Electrical Cardioversion uses a jolt of electricity to your heart either through paddles or wired patches attached to your chest. This is a controlled, usually prescheduled, procedure. Defibrillation is done under light anesthesia in the hospital, and you usually go home the day of the procedure. This is done to get your heart back into a normal rhythm. You are not awake for the procedure. Please see the instruction sheet given to you today.   Follow-Up: At [facility], you and your health needs are our priority.  As part of our continuing mission to provide you with exceptional heart care, we have created designated Provider Care Teams.  These Care Teams include your primary Cardiologist (physician) and Advanced Practice Providers (APPs -  Physician Assistants and Nurse Practitioners) who all work together to provide you with the care you need, when you need it.  We recommend signing up for the patient portal called "MyChart".  Sign up information is provided on this After Visit Summary.  MyChart is used to connect with patients for Virtual Visits (Telemedicine).  Patients are able to view lab/test results, encounter notes, upcoming appointments, etc.  Non-urgent messages can be sent to your provider as well.   To learn more about what you can do with MyChart, go to  [email or web address].    Your next appointment:   Your physician recommends that you follow up with Joshua Palau, NP in the [facility] on 10/27/19 at 2:00 pm  The format for your next appointment:   In Person with Joshua Palau, NP      Your provider has recommended a cardioversion.   You are scheduled for a cardioversion on Tuesday 10/20/19 at 12:00 pm with Dr. Sallyanne Kuster. Please go to [facility] 2nd [place] Stay at 11:00 am.  Enter through the [employer] not have any food or drink after midnight on 10/19/19.  You may take your medicines with a sip of water on the day of your procedure accept for your Hydrochlorothiazide.  You will need someone to drive you home following your procedure.   Call the [place] office at [phone] if you have any questions, problems or concerns.     Electrical Cardioversion Electrical cardioversion is the delivery of a jolt of electricity to change the rhythm of the heart. Sticky patches or metal paddles are placed on the chest to deliver the electricity from a device. This is done to restore a normal rhythm. A rhythm that is too fast or not regular keeps the heart from pumping well. Electrical cardioversion is done in an emergency if:   There is low or no blood pressure as a result of the heart rhythm.   Normal rhythm must be restored as fast as possible to protect the brain and heart from further damage.   It may save a life. Cardioversion  may be done for heart rhythms that are not immediately life threatening, such as atrial fibrillation or flutter, in which:   The heart is beating too fast or is not regular.   Medicine to change the rhythm has not worked.   It is safe to wait in order to allow time for preparation.  Symptoms of the abnormal rhythm are bothersome.  The risk of stroke and other serious problems can be reduced.  LET [facility] CARE PROVIDER KNOW ABOUT:   Any  allergies you have.  All medicines you are taking, including vitamins, herbs, eye drops, creams, and over-the-counter medicines.  Previous problems you or members of your family have had with the use of anesthetics.   Any blood disorders you have.   Previous surgeries you have had.   Medical conditions you have.   RISKS AND COMPLICATIONS  Generally, this is a safe procedure. However, problems can occur and include:   Breathing problems related to the anesthetic used.  A blood clot that breaks free and travels to other parts of your body. This could cause a stroke or other problems. The risk of this is lowered by use of blood-thinning medicine (anticoagulant) prior to the procedure.  Cardiac arrest (rare).   BEFORE THE PROCEDURE   You may have tests to detect blood clots in your heart and to evaluate heart function.  You may start taking anticoagulants so your blood does not clot as easily.   Medicines may be given to help stabilize your heart rate and rhythm.   PROCEDURE  You will be given medicine through an IV tube to reduce discomfort and make you sleepy (sedative).   An electrical shock will be delivered.   AFTER THE PROCEDURE Your heart rhythm will be watched to make sure it does not change. You will need someone to drive you home

## 2019-10-16 ENCOUNTER — Other Ambulatory Visit (HOSPITAL_COMMUNITY)
Admission: RE | Admit: 2019-10-16 | Discharge: 2019-10-16 | Disposition: A | Discharge status: Home / Self Care | Payer: Medicare PPO | Source: Ambulatory Visit | Attending: Cardiovascular Disease | Admitting: Cardiovascular Disease

## 2019-10-16 DIAGNOSIS — Z20822 Contact with and (suspected) exposure to covid-19: Secondary | ICD-10-CM | POA: Insufficient documentation

## 2019-10-16 DIAGNOSIS — Z01812 Encounter for preprocedural laboratory examination: Secondary | ICD-10-CM | POA: Diagnosis not present

## 2019-10-16 LAB — SARS CORONAVIRUS 2 (TAT 6-24 HRS): SARS Coronavirus 2: NEGATIVE

## 2019-10-20 ENCOUNTER — Encounter (HOSPITAL_COMMUNITY)
Admission: RE | Disposition: A | Discharge status: Home / Self Care | Payer: Medicare PPO | Source: Home / Self Care | Attending: Cardiovascular Disease

## 2019-10-20 ENCOUNTER — Other Ambulatory Visit: Payer: Self-pay

## 2019-10-20 ENCOUNTER — Encounter (HOSPITAL_COMMUNITY): Payer: Self-pay | Admitting: Cardiovascular Disease

## 2019-10-20 ENCOUNTER — Ambulatory Visit (HOSPITAL_COMMUNITY)
Admission: RE | Admit: 2019-10-20 | Discharge: 2019-10-20 | Disposition: A | Discharge status: Home / Self Care | Payer: Medicare PPO | Attending: Cardiovascular Disease | Admitting: Cardiovascular Disease

## 2019-10-20 ENCOUNTER — Ambulatory Visit (HOSPITAL_COMMUNITY): Payer: Medicare PPO | Admitting: Anesthesiology

## 2019-10-20 DIAGNOSIS — Z825 Family history of asthma and other chronic lower respiratory diseases: Secondary | ICD-10-CM | POA: Insufficient documentation

## 2019-10-20 DIAGNOSIS — E7800 Pure hypercholesterolemia, unspecified: Secondary | ICD-10-CM | POA: Diagnosis not present

## 2019-10-20 DIAGNOSIS — H9190 Unspecified hearing loss, unspecified ear: Secondary | ICD-10-CM | POA: Diagnosis not present

## 2019-10-20 DIAGNOSIS — I4819 Other persistent atrial fibrillation: Secondary | ICD-10-CM

## 2019-10-20 DIAGNOSIS — I480 Paroxysmal atrial fibrillation: Secondary | ICD-10-CM | POA: Diagnosis not present

## 2019-10-20 DIAGNOSIS — Z881 Allergy status to other antibiotic agents status: Secondary | ICD-10-CM | POA: Diagnosis not present

## 2019-10-20 DIAGNOSIS — I2510 Atherosclerotic heart disease of native coronary artery without angina pectoris: Secondary | ICD-10-CM | POA: Diagnosis not present

## 2019-10-20 DIAGNOSIS — Z79899 Other long term (current) drug therapy: Secondary | ICD-10-CM | POA: Diagnosis not present

## 2019-10-20 DIAGNOSIS — Z87891 Personal history of nicotine dependence: Secondary | ICD-10-CM | POA: Diagnosis not present

## 2019-10-20 DIAGNOSIS — I10 Essential (primary) hypertension: Secondary | ICD-10-CM | POA: Insufficient documentation

## 2019-10-20 DIAGNOSIS — Z7982 Long term (current) use of aspirin: Secondary | ICD-10-CM | POA: Insufficient documentation

## 2019-10-20 DIAGNOSIS — Z7901 Long term (current) use of anticoagulants: Secondary | ICD-10-CM | POA: Insufficient documentation

## 2019-10-20 DIAGNOSIS — Z8249 Family history of ischemic heart disease and other diseases of the circulatory system: Secondary | ICD-10-CM | POA: Diagnosis not present

## 2019-10-20 DIAGNOSIS — J449 Chronic obstructive pulmonary disease, unspecified: Secondary | ICD-10-CM | POA: Insufficient documentation

## 2019-10-20 DIAGNOSIS — K7460 Unspecified cirrhosis of liver: Secondary | ICD-10-CM | POA: Insufficient documentation

## 2019-10-20 DIAGNOSIS — I4892 Unspecified atrial flutter: Secondary | ICD-10-CM | POA: Diagnosis not present

## 2019-10-20 HISTORY — PX: CARDIOVERSION: SHX1299

## 2019-10-20 LAB — POCT I-STAT, CHEM 8
BUN: 28 mg/dL — ABNORMAL HIGH (ref 8–23)
Calcium, Ion: 1.07 mmol/L — ABNORMAL LOW (ref 1.15–1.40)
Chloride: 98 mmol/L (ref 98–111)
Creatinine, Ser: 1 mg/dL (ref 0.61–1.24)
Glucose, Bld: 65 mg/dL — ABNORMAL LOW (ref 70–99)
HCT: 54 % — ABNORMAL HIGH (ref 39.0–52.0)
Hemoglobin: 18.4 g/dL — ABNORMAL HIGH (ref 13.0–17.0)
Potassium: 4.8 mmol/L (ref 3.5–5.1)
Sodium: 140 mmol/L (ref 135–145)
TCO2: 34 mmol/L — ABNORMAL HIGH (ref 22–32)

## 2019-10-20 LAB — PROTIME-INR
INR: 2 — ABNORMAL HIGH (ref 0.8–1.2)
Prothrombin Time: 22.2 seconds — ABNORMAL HIGH (ref 11.4–15.2)

## 2019-10-20 SURGERY — CARDIOVERSION
Anesthesia: General

## 2019-10-20 MED ORDER — PROPOFOL 10 MG/ML IV BOLUS
INTRAVENOUS | Status: DC | PRN
  Administered 2019-10-20: 70 mg via INTRAVENOUS

## 2019-10-20 MED ORDER — SODIUM CHLORIDE 0.9 % IV SOLN: INTRAVENOUS | Status: DC | PRN

## 2019-10-20 MED ORDER — LIDOCAINE 2% (20 MG/ML) 5 ML SYRINGE
INTRAMUSCULAR | Status: DC | PRN
  Administered 2019-10-20: 80 mg via INTRAVENOUS

## 2019-10-20 NOTE — Discharge Instructions (Signed)
Electrical Cardioversion Electrical cardioversion is the delivery of a jolt of electricity to restore a normal rhythm to the heart. A rhythm that is too fast or is not regular keeps the heart from pumping well. In this procedure, sticky patches or metal paddles are placed on the chest to deliver electricity to the heart from a device. General instructions  Follow instructions from your health care provider about eating or drinking restrictions.  Plan to have someone take you home from the hospital or clinic.  If you will be going home right after the procedure, plan to have someone with you for 24 hours.  Ask your health care provider what steps will be taken to help prevent infection. These may include washing your skin with a germ-killing soap. What happens during the procedure?   An IV will be inserted into one of your veins.  Sticky patches (electrodes) or metal paddles may be placed on your chest.  You will be given a medicine to help you relax (sedative).  An electrical shock will be delivered. The procedure may vary among health care providers and hospitals. What can I expect after the procedure?  Your blood pressure, heart rate, breathing rate, and blood oxygen level will be monitored until you leave the hospital or clinic.  Your heart rhythm will be watched to make sure it does not change.  You may have some redness on the skin where the shocks were given. Follow these instructions at home:  Do not drive for 24 hours if you were given a sedative during your procedure.  Take over-the-counter and prescription medicines only as told by your health care provider.  Ask your health care provider how to check your pulse. Check it often.  Rest for 48 hours after the procedure or as told by your health care provider.  Avoid or limit your caffeine use as told by your health care provider.  Keep all follow-up visits as told by your health care provider. This is  important. Contact a health care provider if:  You feel like your heart is beating too quickly or your pulse is not regular.  You have a serious muscle cramp that does not go away. Get help right away if:  You have discomfort in your chest.  You are dizzy or you feel faint.  You have trouble breathing or you are short of breath.  Your speech is slurred.  You have trouble moving an arm or leg on one side of your body.  Your fingers or toes turn cold or blue. Summary  Electrical cardioversion is the delivery of a jolt of electricity to restore a normal rhythm to the heart.  This procedure may be done right away in an emergency or may be a scheduled procedure if the condition is not an emergency.  Generally, this is a safe procedure.  After the procedure, check your pulse often as told by your health care provider. This information is not intended to replace advice given to you by your health care provider. Make sure you discuss any questions you have with your health care provider. Document Revised: 09/15/2018 Document Reviewed: 09/15/2018 Elsevier Patient Education  [place].

## 2019-10-20 NOTE — Anesthesia Postprocedure Evaluation (Signed)
Anesthesia Post Note  Patient: Joshua [name]  Procedure(s) Performed: CARDIOVERSION (N/A )     Patient location during evaluation: PACU Anesthesia Type: General Level of consciousness: awake and alert Pain management: pain level controlled Vital Signs Assessment: post-procedure vital signs reviewed and stable Respiratory status: spontaneous breathing, nonlabored ventilation, respiratory function stable and patient connected to nasal cannula oxygen Cardiovascular status: blood pressure returned to baseline and stable Postop Assessment: no apparent nausea or vomiting Anesthetic complications: no   No complications documented.  Last Vitals:  Vitals:   10/20/19 1245 10/20/19 1255  BP: (!) 128/49 (!) 127/36  Pulse: 61 (!) 57  Resp: 17 (!) 22  Temp:    SpO2: 96% 91%    Last Pain:  Vitals:   10/20/19 1255  TempSrc:   PainSc: 0-No pain                 Merlinda Frederick

## 2019-10-20 NOTE — Interval H&P Note (Signed)
History and Physical Interval Note:  10/20/2019 11:27 AM  Joshua [name]  has presented today for surgery, with the diagnosis of AFIB.  The various methods of treatment have been discussed with the patient and family. After consideration of risks, benefits and other options for treatment, the patient has consented to  Procedure(s): CARDIOVERSION (N/A) as a surgical intervention.  The patient's history has been reviewed, patient examined, no change in status, stable for surgery.  I have reviewed the patient's chart and labs.  Questions were answered to the patient's satisfaction.     Shelva Hetzer

## 2019-10-20 NOTE — Op Note (Signed)
Procedure: Electrical Cardioversion Indications:  Atrial Fibrillation  Procedure Details:  Consent: Risks of procedure as well as the alternatives and risks of each were explained to the (patient/caregiver).  Consent for procedure obtained.  Time Out: Verified patient identification, verified procedure, site/side was marked, verified correct patient position, special equipment/implants available, medications/allergies/relevent history reviewed, required imaging and test results available.  Performed  Patient placed on cardiac monitor, pulse oximetry, supplemental oxygen as necessary.  Sedation given: propofol 70 mg IV, Dr. Elgie Congo Pacer pads placed anterior and posterior chest.  Cardioverted 1 time(s).  Cardioversion with synchronized biphasic 120J shock.  Evaluation: Findings: Post procedure EKG shows: sinus bradycardia with PACs Complications: None Patient did tolerate procedure well.  Time Spent Directly with the Patient:  30 minutes   Joshua [name] 10/20/2019, 12:21 PM

## 2019-10-20 NOTE — Anesthesia Preprocedure Evaluation (Addendum)
Anesthesia Evaluation  Patient identified by MRN, date of birth, ID band Patient awake    Reviewed: Allergy & Precautions, NPO status , Patient's Chart, lab work & pertinent test results  Airway Mallampati: II  TM Distance: >3 FB     Dental no notable dental hx.    Pulmonary COPD, former smoker,    Pulmonary exam normal breath sounds clear to auscultation       Cardiovascular hypertension, + CAD  + dysrhythmias Atrial Fibrillation + Valvular Problems/Murmurs (mild AS) AI  Rhythm:Irregular Rate:Normal   1. The severe MAC projects into the LVOT, causing flow turbulence.  However, no significant LVOT gradient noted.. Left ventricular ejection  fraction, by estimation, is 60 to 65%. The left ventricle has normal  function. The left ventricle has no regional  wall motion abnormalities. There is severe concentric left ventricular  hypertrophy. Left ventricular diastolic function could not be evaluated.  2. Right ventricular systolic function is low normal. The right  ventricular size is mildly enlarged. There is mildly elevated pulmonary  artery systolic pressure.  3. Left atrial size was moderately dilated.  4. Right atrial size was moderately dilated.  5. The mitral valve is degenerative. Mild mitral valve regurgitation.  Mild mitral stenosis. The mean mitral valve gradient is 7.0 mmHg.  6. The aortic valve is tricuspid. Aortic valve regurgitation is moderate.  7. The inferior vena cava is normal in size with greater than 50%  respiratory variability, suggesting right atrial pressure of 3 mmHg.   Comparison(s): No significant change from prior study   Neuro/Psych    GI/Hepatic hiatal hernia,   Endo/Other    Renal/GU      Musculoskeletal   Abdominal   Peds  Hematology  (+) anemia ,   Anesthesia Other Findings   Reproductive/Obstetrics                            Anesthesia  Physical Anesthesia Plan  ASA: III  Anesthesia Plan: General   Post-op Pain Management:    Induction:   PONV Risk Score and Plan: Propofol infusion, TIVA and Treatment may vary due to age or medical condition  Airway Management Planned: Mask  Additional Equipment:   Intra-op Plan:   Post-operative Plan:   Informed Consent: I have reviewed the patients History and Physical, chart, labs and discussed the procedure including the risks, benefits and alternatives for the proposed anesthesia with the patient or authorized representative who has indicated his/her understanding and acceptance.       Plan Discussed with:   Anesthesia Plan Comments:         Anesthesia Quick Evaluation

## 2019-10-20 NOTE — Transfer of Care (Signed)
Immediate Anesthesia Transfer of Care Note  Patient: KHANI PAINO  Procedure(s) Performed: CARDIOVERSION (N/A )  Patient Location: Endoscopy Unit  Anesthesia Type:MAC  Level of Consciousness: drowsy  Airway & Oxygen Therapy: Patient Spontanous Breathing  Post-op Assessment: Report given to RN and Post -op Vital signs reviewed and stable  Post vital signs: Reviewed and stable  Last Vitals:  Vitals Value Taken Time  BP    Temp    Pulse    Resp    SpO2      Last Pain:  Vitals:   10/20/19 1203  TempSrc:   PainSc: 0-No pain         Complications: No complications documented.

## 2019-10-21 ENCOUNTER — Encounter (HOSPITAL_COMMUNITY): Payer: Self-pay | Admitting: Cardiovascular Disease

## 2019-10-22 ENCOUNTER — Other Ambulatory Visit: Payer: Self-pay

## 2019-10-22 ENCOUNTER — Ambulatory Visit (HOSPITAL_COMMUNITY)
Admission: RE | Admit: 2019-10-22 | Discharge: 2019-10-22 | Disposition: A | Discharge status: Home / Self Care | Payer: Medicare PPO | Source: Ambulatory Visit | Attending: Cardiology | Admitting: Cardiology

## 2019-10-22 DIAGNOSIS — I4891 Unspecified atrial fibrillation: Secondary | ICD-10-CM

## 2019-10-22 DIAGNOSIS — I4892 Unspecified atrial flutter: Secondary | ICD-10-CM | POA: Insufficient documentation

## 2019-10-22 DIAGNOSIS — I2510 Atherosclerotic heart disease of native coronary artery without angina pectoris: Secondary | ICD-10-CM

## 2019-10-22 DIAGNOSIS — I480 Paroxysmal atrial fibrillation: Secondary | ICD-10-CM

## 2019-10-22 DIAGNOSIS — I421 Obstructive hypertrophic cardiomyopathy: Secondary | ICD-10-CM | POA: Insufficient documentation

## 2019-10-27 ENCOUNTER — Ambulatory Visit (HOSPITAL_COMMUNITY)
Admission: RE | Admit: 2019-10-27 | Discharge: 2019-10-27 | Disposition: A | Discharge status: Home / Self Care | Payer: Medicare PPO | Source: Ambulatory Visit | Attending: Nurse Practitioner | Admitting: Nurse Practitioner

## 2019-10-27 ENCOUNTER — Encounter (HOSPITAL_COMMUNITY): Payer: Self-pay | Admitting: Nurse Practitioner

## 2019-10-27 ENCOUNTER — Other Ambulatory Visit: Payer: Self-pay

## 2019-10-27 VITALS — BP 138/58 | HR 60 | Ht 68.5 in | Wt 174.8 lb

## 2019-10-27 DIAGNOSIS — D509 Iron deficiency anemia, unspecified: Secondary | ICD-10-CM | POA: Insufficient documentation

## 2019-10-27 DIAGNOSIS — I421 Obstructive hypertrophic cardiomyopathy: Secondary | ICD-10-CM | POA: Insufficient documentation

## 2019-10-27 DIAGNOSIS — Z87891 Personal history of nicotine dependence: Secondary | ICD-10-CM | POA: Diagnosis not present

## 2019-10-27 DIAGNOSIS — Z7982 Long term (current) use of aspirin: Secondary | ICD-10-CM | POA: Diagnosis not present

## 2019-10-27 DIAGNOSIS — I4892 Unspecified atrial flutter: Secondary | ICD-10-CM | POA: Insufficient documentation

## 2019-10-27 DIAGNOSIS — I2510 Atherosclerotic heart disease of native coronary artery without angina pectoris: Secondary | ICD-10-CM | POA: Diagnosis not present

## 2019-10-27 DIAGNOSIS — J449 Chronic obstructive pulmonary disease, unspecified: Secondary | ICD-10-CM | POA: Diagnosis not present

## 2019-10-27 DIAGNOSIS — D696 Thrombocytopenia, unspecified: Secondary | ICD-10-CM | POA: Insufficient documentation

## 2019-10-27 DIAGNOSIS — Z7901 Long term (current) use of anticoagulants: Secondary | ICD-10-CM | POA: Diagnosis not present

## 2019-10-27 DIAGNOSIS — Z955 Presence of coronary angioplasty implant and graft: Secondary | ICD-10-CM | POA: Diagnosis not present

## 2019-10-27 DIAGNOSIS — K7460 Unspecified cirrhosis of liver: Secondary | ICD-10-CM | POA: Insufficient documentation

## 2019-10-27 DIAGNOSIS — D6869 Other thrombophilia: Secondary | ICD-10-CM | POA: Diagnosis not present

## 2019-10-27 DIAGNOSIS — Z79899 Other long term (current) drug therapy: Secondary | ICD-10-CM | POA: Diagnosis not present

## 2019-10-27 DIAGNOSIS — Z8249 Family history of ischemic heart disease and other diseases of the circulatory system: Secondary | ICD-10-CM | POA: Insufficient documentation

## 2019-10-27 DIAGNOSIS — E7800 Pure hypercholesterolemia, unspecified: Secondary | ICD-10-CM | POA: Diagnosis not present

## 2019-10-27 DIAGNOSIS — Z888 Allergy status to other drugs, medicaments and biological substances status: Secondary | ICD-10-CM | POA: Diagnosis not present

## 2019-10-27 DIAGNOSIS — Z8719 Personal history of other diseases of the digestive system: Secondary | ICD-10-CM | POA: Insufficient documentation

## 2019-10-27 NOTE — Progress Notes (Signed)
Primary Care Physician: [name], [name], MD Referring Physician: [name] Cardiologist: Dr. Julieta Bellini Joshua [name] is a 74 y.o. male with a h/o CAD, HOCM, and atrial flutter, prior ablation 2012,  on norpace  for several years. He recently saw [name] for options of restoring SR  as he had returned to atrial flutter. Ablation, change in antiarrythmic( Tikosyn vrs amiodarone) were discussed with the pt as well as the option of staying on Norpace  and trying a cardioversion. He chose cardioversion. He did convert but unfortunately returned to atrial flutter. [name] did not think he would be an ideal candidate for ablation so antiarrythmic's are  discussed today. He is interested in an [place] admission after Norpace wash out and when elective admissions restriction is lifted  due to the bed situation with Covid-19 . He does not use benadryl. He has prn  HCTZ on his med list but states that he never uses it.   He is interested in changing from  warfarin to [place] and will let his coumadin clinic at [facility] know of his wishes to help him transfer. He gets his drugs thru the [place] so cost will not  be an issue.  Today, he denies symptoms of palpitations, chest pain, shortness of breath, orthopnea, PND, lower extremity edema, dizziness, presyncope, syncope, or neurologic sequela. The patient is tolerating medications without difficulties and is otherwise without complaint today.   Past Medical History:  Diagnosis Date  . Acute lower GI bleeding    related to gastritis / viral infection  . Cataract   . Cirrhosis ([place])   . COPD (chronic obstructive pulmonary disease) ([place])    PFT 05-05-2010, FEV1 .9 (26%) ratio 60  . Coronary artery disease   . Gastritis 06-10-2006  . Hiatal hernia 06-10-2006   EGD  . HOH (hard of hearing)   . Hx of adenomatous colonic polyps 2005, 2013   Colonoscopy  . Hypercholesteremia   . Hypertension   . Hypertr obst cardiomyop    mild subaortic stenosis   . Iron deficiency anemia, unspecified   . LVH (left ventricular hypertrophy)   . Other B-complex deficiencies   . Persistent atrial fibrillation (H[facility])    persistent  . Rheumatic fever   . Thrombocytopenia ([place]) 05/2008   Past Surgical History:  Procedure Laterality Date  . ATRIAL FLUTTER ABLATION  06/2010   CTI ablation performed by [name]  . CARDIAC CATHETERIZATION  05/28/2005   Widely patent coronaries and normal LV function.  Marland Kitchen CARDIAC CATHETERIZATION  08/11/2001   80% LAD stenosis successfully stented with a 3.5x87mm Cypher DES postdilated to 3.29mm resulting in reduction of 80% to 0%.  . CARDIAC CATHETERIZATION  01/15/2001   Focal 95% proximal RCA stenosis, stented with a 3x28mm Zeta multilink stent with postdilatation utilizing a 3.5x21mm Quantum balloon with stenosis being reduced to 0%.  . CARDIOVASCULAR STRESS TEST  05/29/2011   No scintigraphic evidence of inducible myocardial ischemia. No ECG changes. EKG negative for ischemia.  Marland Kitchen CARDIOVERSION N/A 10/20/2019   Procedure: CARDIOVERSION;  Surgeon: Sanda Klein, MD;  Location: [facility];  Service: Cardiovascular;  Laterality: N/A;  . CORONARY ANGIOPLASTY     2002-2003  . SHOULDER SURGERY    . TRANSTHORACIC ECHOCARDIOGRAM  05/29/2011   EF >70%, severe concentric LV hypertrophy, severe mitral annular calcification, mild-moderate aortic regurg,     Current Outpatient Medications  Medication Sig Dispense Refill  . aspirin EC 81 MG tablet Take 81 mg by mouth  at bedtime.     . cholecalciferol (VITAMIN D) 1000 units tablet Take 1,000 Units by mouth daily.    . cyanocobalamin ([employer] VITAMIN B12) 1000 MCG tablet Take 1 tablet (1,000 mcg total) by mouth daily.    . disopyramide (NORPACE) 150 MG capsule Take 2 capsules (300 mg total) by mouth 2 (two) times daily. 360 capsule 3  . furosemide (LASIX) 20 MG tablet Take 20 mg by mouth daily as needed for fluid.     . hydrochlorothiazide (HYDRODIURIL) 25 MG tablet Take 0.5 tablets (12.5  mg total) by mouth as needed. (Patient taking differently: Take 12.5 mg by mouth daily. ) 90 tablet 3  . losartan (COZAAR) 50 MG tablet Take 50 mg by mouth daily.    Marland Kitchen omeprazole (PRILOSEC) 10 MG capsule Take 1 capsule (10 mg total) by mouth daily. 90 capsule 3  . polyethylene glycol (MIRALAX / GLYCOLAX) packet Take 17 g by mouth every other day.    . rosuvastatin (CRESTOR) 10 MG tablet Take 1 tablet (10 mg total) by mouth daily. (Patient taking differently: Take 10 mg by mouth every evening. ) 90 tablet 3  . tamsulosin (FLOMAX) 0.4 MG CAPS capsule Take 1 capsule (0.4 mg total) by mouth daily. (Patient taking differently: Take 0.4 mg by mouth at bedtime. ) 90 capsule 3  . warfarin (JANTOVEN) 4 MG tablet TAKE 1 TABLET BY MOUTH DAILY, EXCEPT ON TUES ANDFRIDAY TAKE 1 AND 1/2TAB (Patient taking differently: Take 4-6 mg by mouth See admin instructions. Take 4 mg at night on Sun, Mon, Wed, Thurs, and Sat. Take 6 mg at night on Tues and Fri) 115 tablet 3   No current facility-administered medications for this encounter.    Allergies  Allergen Reactions  . Tetracycline Other (See Comments)    Issue with stomach lining - can take with food if needed    Social History   Socioeconomic History  . Marital status: Widowed    Spouse name: Not on file  . Number of children: 5  . Years of education: Not on file  . Highest education level: Not on file  Occupational History  . 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 Smoking status: Former Smoker    Packs/day: 1.00    Years: 10.00    Pack years: 10.00    Types: Cigarettes    Quit date: 02/26/1985    Years since quitting: 34.6  . Smokeless tobacco: Never Used  Vaping Use  . Vaping Use: Never used  Substance and Sexual Activity  . Alcohol use: No  . Drug use: No  . Sexual activity: Not on file  Other Topics Concern  . Not on file  Social History Narrative  . Not on file   Social Determinants of Health    Financial Resource Strain:   . Difficulty of Paying Living Expenses: Not on file  Food Insecurity:   . Worried About Charity fundraiser in the Last Year: Not on file  . Ran Out of Food in the Last Year: Not on file  Transportation Needs:   . Lack of Transportation (Medical): Not on file  . Lack of Transportation (Non-Medical): Not on file  Physical Activity:   . Days of Exercise per Week: Not on file  . Minutes of Exercise per Session: Not on file  Stress:   . Feeling of Stress : Not on file  Social Connections:   . Frequency of Communication with Friends and  Family: Not on file  . Frequency of Social Gatherings with Friends and Family: Not on file  . Attends Religious Services: Not on file  . Active Member of Clubs or Organizations: Not on file  . Attends Archivist Meetings: Not on file  . Marital Status: Not on file  Intimate Partner Violence:   . Fear of Current or Ex-Partner: Not on file  . Emotionally Abused: Not on file  . Physically Abused: Not on file  . Sexually Abused: Not on file    Family History  Problem Relation Age of Onset  . Heart attack Brother 39       Ventircular rupture  . Emphysema Brother   . Lung cancer Brother   . Heart disease Mother        Enlarged heart  . Stroke Mother   . Hyperlipidemia Father   . CAD Father   . Hypertension Sister   . CAD Paternal Uncle   . Stroke Paternal Uncle   . Cancer Maternal Grandfather        colon  . Colon cancer Maternal Grandfather   . Asthma Brother     ROS- All systems are reviewed and negative except as per the HPI above  Physical Exam: Vitals:   10/27/19 1403  BP: (!) 138/58  Pulse: 60  Weight: 79.3 kg  Height: 5' 8.5" (1.74 m)   Wt Readings from Last 3 Encounters:  10/27/19 79.3 kg  10/20/19 76.7 kg  10/15/19 79 kg    Labs: Lab Results  Component Value Date   NA 140 10/20/2019   K 4.8 10/20/2019   CL 98 10/20/2019   CO2 35 (H) 09/09/2019   GLUCOSE 65 (L) 10/20/2019    BUN 28 (H) 10/20/2019   CREATININE 1.00 10/20/2019   CALCIUM 8.9 09/09/2019   MG 2.3 06/12/2019   Lab Results  Component Value Date   INR 2.0 (H) 10/20/2019   Lab Results  Component Value Date   CHOL 128 03/11/2019   HDL 35 (L) 03/11/2019   LDLCALC 74 03/11/2019   TRIG 103 03/11/2019     GEN- The patient is well appearing, alert and oriented x 3 today.   Head- normocephalic, atraumatic Eyes-  Sclera clear, conjunctiva pink Ears- hearing intact Oropharynx- clear Neck- supple, no JVP Lymph- no cervical lymphadenopathy Lungs- Clear to ausculation bilaterally, normal work of breathing Heart- irregular rate and rhythm, no murmurs, rubs or gallops, PMI not laterally displaced GI- soft, NT, ND, + BS Extremities- no clubbing, cyanosis, or edema MS- no significant deformity or atrophy Skin- no rash or lesion Psych- euthymic mood, full affect Neuro- strength and sensation are intact  EKG-aflutter at 60 bpm, qrs int 148 ms, qtc 472 ms    Assessment and Plan: 1. Atrial  flutter  Successful cardioversion but is now back in flutter Was not aware but state he felt great the first day after cardioversion  We discussed options form Dr. Jackalyn Lombard note He would prefer Tikosyn after norpace washout when elective beds are available Will send meds to pharmD for  screening  No benadryl use He has hctz prn, he states that he does not use Last K+/mag in Epic in range  He plans to get drug thru the [place]  2. [id number] score of 5 He wishes to tranfer from  warfarin to [place] I will let [facility] coumadin clinic be aware to help transition   We will be in touch when elective bed admission restriction is lifted for  tikosyn admit  [employer]. Naeemah Jasmer, [facility] [address] [place], [place] [address] [phone]

## 2019-10-27 NOTE — Patient Instructions (Addendum)
We will contact you when bed restrictions for tikosyn loading have been lifted to arrange admission

## 2019-10-29 ENCOUNTER — Ambulatory Visit: Payer: Medicare PPO | Admitting: Family Medicine

## 2019-10-29 ENCOUNTER — Encounter: Payer: Self-pay | Admitting: Family Medicine

## 2019-10-29 ENCOUNTER — Other Ambulatory Visit: Payer: Self-pay

## 2019-10-29 VITALS — BP 118/69 | HR 43 | Temp 97.7°F | Ht 68.5 in | Wt 177.8 lb

## 2019-10-29 DIAGNOSIS — R300 Dysuria: Secondary | ICD-10-CM | POA: Diagnosis not present

## 2019-10-29 DIAGNOSIS — I480 Paroxysmal atrial fibrillation: Secondary | ICD-10-CM

## 2019-10-29 DIAGNOSIS — Z7901 Long term (current) use of anticoagulants: Secondary | ICD-10-CM

## 2019-10-29 LAB — URINALYSIS, COMPLETE
Bilirubin, UA: NEGATIVE
Glucose, UA: NEGATIVE
Ketones, UA: NEGATIVE
Leukocytes,UA: NEGATIVE
Nitrite, UA: NEGATIVE
Specific Gravity, UA: >1.03 — ABNORMAL HIGH (ref 1.005–1.030)
Urobilinogen, Ur: 0.2 mg/dL (ref 0.2–1.0)
pH, UA: 5 (ref 5.0–7.5)

## 2019-10-29 LAB — COAGUCHEK XS/INR WAIVED
INR: 2.6 — ABNORMAL HIGH (ref 0.9–1.1)
Prothrombin Time: 31.4 s

## 2019-10-29 LAB — MICROSCOPIC EXAMINATION: Bacteria, UA: NONE SEEN

## 2019-10-29 MED ORDER — CEPHALEXIN 500 MG PO CAPS: 500.0000 mg | ORAL_CAPSULE | Freq: Four times a day (QID) | ORAL | 0 refills | Status: DC

## 2019-10-29 NOTE — Addendum Note (Signed)
Addended by: Caryl Pina on: 10/29/2019 09:50 AM   Modules accepted: Orders

## 2019-10-29 NOTE — Progress Notes (Addendum)
BP 118/69   Pulse (!) 43   Temp 97.7 F (36.5 C) (Temporal)   Ht 5' 8.5" (1.74 m)   Wt 177 lb 12.8 oz (80.6 kg)   BMI 26.64 kg/m    Subjective:   Patient ID: Joshua [name], male    DOB: Jun 18, 1945, 74 y.o.   MRN: [id number]  HPI: ALLANTE WHITMIRE is a 74 y.o. male presenting on 10/29/2019 for INR check   HPI Coumadin recheck Target goal; 2.0-3.0  Reason on anticoagulation: A. fib Patient denies any bruising or bleeding or chest pain or palpitations   Patient is coming in complaining of dysuria and hematuria that all started yesterday, he passed some blood yesterday and then he has been having some burning since. Patient denies fevers or chills.  Relevant past medical, surgical, family and social history reviewed and updated as indicated. Interim medical history since our last visit reviewed. Allergies and medications reviewed and updated.  Review of Systems  Constitutional: Negative for chills and fever.  Eyes: Negative for visual disturbance.  Respiratory: Negative for shortness of breath and wheezing.   Cardiovascular: Negative for chest pain and leg swelling.  Genitourinary: Positive for dysuria and hematuria. Negative for decreased urine volume, difficulty urinating and genital sores.  Musculoskeletal: Negative for back pain and gait problem.  Skin: Negative for rash.  All other systems reviewed and are negative.   Per HPI unless specifically indicated above   Allergies as of 10/29/2019      Reactions   Tetracycline Other (See Comments)   Issue with stomach lining - can take with food if needed      Medication List       Accurate as of October 29, 2019  9:16 AM. If you have any questions, ask your nurse or doctor.        aspirin EC 81 MG tablet Take 81 mg by mouth at bedtime.   cholecalciferol 1000 units tablet Commonly known as: VITAMIN D Take 1,000 Units by mouth daily.   cyanocobalamin 1000 MCG tablet Commonly known as: CVS VITAMIN B12 Take 1 tablet  (1,000 mcg total) by mouth daily.   disopyramide 150 MG capsule Commonly known as: NORPACE Take 2 capsules (300 mg total) by mouth 2 (two) times daily.   furosemide 20 MG tablet Commonly known as: LASIX Take 20 mg by mouth daily as needed for fluid.   hydrochlorothiazide 25 MG tablet Commonly known as: HYDRODIURIL Take 0.5 tablets (12.5 mg total) by mouth as needed. What changed: when to take this   losartan 50 MG tablet Commonly known as: COZAAR Take 50 mg by mouth daily.   omeprazole 10 MG capsule Commonly known as: PRILOSEC Take 1 capsule (10 mg total) by mouth daily.   polyethylene glycol 17 g packet Commonly known as: MIRALAX / GLYCOLAX Take 17 g by mouth every other day.   rosuvastatin 10 MG tablet Commonly known as: CRESTOR Take 1 tablet (10 mg total) by mouth daily. What changed: when to take this   tamsulosin 0.4 MG Caps capsule Commonly known as: FLOMAX Take 1 capsule (0.4 mg total) by mouth daily. What changed: when to take this   warfarin 4 MG tablet Commonly known as: Jantoven Take as directed by the anticoagulation clinic. If you are unsure how to take this medication, talk to your nurse or doctor. Original instructions: TAKE 1 TABLET BY MOUTH DAILY, EXCEPT ON TUES ANDFRIDAY TAKE 1 AND 1/2TAB What changed:   how much to take  how  to take this  when to take this  additional instructions        Objective:   BP 118/69   Pulse (!) 43   Temp 97.7 F (36.5 C) (Temporal)   Ht 5' 8.5" (1.74 m)   Wt 177 lb 12.8 oz (80.6 kg)   BMI 26.64 kg/m   Wt Readings from Last 3 Encounters:  10/29/19 177 lb 12.8 oz (80.6 kg)  10/27/19 174 lb 12.8 oz (79.3 kg)  10/20/19 169 lb (76.7 kg)    Physical Exam Vitals and nursing note reviewed.  Constitutional:      General: He is not in acute distress.    Appearance: He is well-developed. He is not diaphoretic.  Eyes:     General: No scleral icterus.    Conjunctiva/sclera: Conjunctivae normal.  Neck:      Thyroid: No thyromegaly.  Musculoskeletal:     Cervical back: Neck supple.  Lymphadenopathy:     Cervical: No cervical adenopathy.  Skin:    General: Skin is warm and dry.     Findings: No rash.  Neurological:     Mental Status: He is alert and oriented to person, place, and time.     Coordination: Coordination normal.  Psychiatric:        Behavior: Behavior normal.     Urinalysis: 6-10 WBCs, 0-2 RBCs, 0-10 epithelial cells, bacteria not seen, 2+ blood, 2+ protein.  Assessment & Plan:   Problem List Items Addressed This Visit      Cardiovascular and Mediastinum   FIBRILLATION, ATRIAL   PAF (paroxysmal atrial fibrillation) ([place])     Other   Long term (current) use of anticoagulants [Z79.01] - Primary   Relevant Orders   CoaguChek XS/INR Waived (Completed)    Other Visit Diagnoses    Dysuria       Relevant Medications   cephALEXin (KEFLEX) 500 MG capsule   Other Relevant Orders   Urinalysis, Complete   Urine Culture      Description   continue taking 1 1/2 tablets on Mondays, Thursdays, and 1 tablet all other days of the week.   INR today is 2.6 today ( goal is 2.0-3.0)  Perfect reading today Return in 6-8 weeks    Based on symptoms and blood, will go ahead and treat for urine, sent Keflex, should not have to adjust Coumadin too much on the Keflex, watch for any further signs of bleeding. Follow up plan: Return if symptoms worsen or fail to improve, for 6-8-week INR.  Counseling provided for all of the vaccine components Orders Placed This Encounter  Procedures  . Urine Culture  . CoaguChek XS/INR Waived  . Urinalysis, Complete    Caryl Pina, MD [facility] 10/29/2019, 9:16 AM

## 2019-10-30 LAB — URINE CULTURE: Organism ID, Bacteria: NO GROWTH

## 2019-11-19 ENCOUNTER — Telehealth: Payer: Self-pay | Admitting: Cardiovascular Disease

## 2019-11-19 NOTE — Telephone Encounter (Signed)
Responded via mychart

## 2019-11-19 NOTE — Telephone Encounter (Signed)
Follow Up:    Pt was returning a call from this morning, He did not know who called.

## 2019-11-20 NOTE — Telephone Encounter (Signed)
Carotid doppler results sent via MyChart 11/19/19 by Lars Mage RN  Troy Sine, MD  11/17/2019 7:50 PM EDT     Mild carotid plaque in the 1 to 39% range bilaterally. Normal antegrade flow in the vertebral arteries

## 2019-11-30 ENCOUNTER — Ambulatory Visit (INDEPENDENT_AMBULATORY_CARE_PROVIDER_SITE_OTHER): Payer: Medicare PPO

## 2019-11-30 ENCOUNTER — Other Ambulatory Visit: Payer: Self-pay

## 2019-11-30 DIAGNOSIS — Z23 Encounter for immunization: Secondary | ICD-10-CM

## 2019-12-09 ENCOUNTER — Telehealth: Payer: Self-pay | Admitting: Pharmacist

## 2019-12-09 ENCOUNTER — Telehealth: Payer: Self-pay

## 2019-12-09 NOTE — Telephone Encounter (Addendum)
Medication list reviewed in anticipation of upcoming Tikosyn initiation. Patient is taking 2 contraindicated or QTc prolonging medications.   The patient is currently taking 12.5mg  of hydrochlorothiazide & disopyramide which is contraindicated for coadministration with Tikosyn as it can increase systemic exposure of Tikosyn and cause QTC prolongation.   Concurrent use of DOFETILIDE and HYDROCHLOROTHIAZIDE is contraindicated and may result in an increased risk of cardiotoxicity (QT prolongation, torsades de pointes, cardiac arrhythmias).  Concurrent use of DISOPYRAMIDE and DOFETILIDE may result in an increased risk of cardiotoxicity (QT prolongation, torsades de pointes, cardiac arrest). This medication should be discontinued > 3 half-lives prior to initiation of dofetilide to avoid cardiotoxicity.   Patient is anticoagulated on warfarin (INR 2.6).  Patient is currently in the process of transitioning from warfarin to Eliquis.  If this occurs, please ensure patient has not missed any anticoagulation doses in the 3 weeks prior to Tikosyn initiation.   Patient will need to be counseled to avoid use of Benadryl while on Tikosyn and in the 2-3 days prior to Tikosyn initiation.

## 2019-12-09 NOTE — Telephone Encounter (Addendum)
Spoke with patient about his interest in transitioning from warfarin to apixaban for afib anticoagulation (indication was confirmed with patient).  Pt's warfarin is managed by [facility], but per patient, they do not have a pharmacist there to speak to him about the transition process. I reviewed the process with the patient and instructed him to hold warfarin 2-3 days prior to his next INR check. As his next INR check is scheduled for 12/22/19, and latest INR was 2.6,  I told him to hold his warfarin starting 12/20/19. Informed patient that he can initiate Eliquis once his INR <2.0.  The patient expressed interest in transitioning his anticoagulation care to the [facility], so the patient was encouraged to call them to establish care. The patient expressed understanding and was provided our call back number for any further questions.

## 2019-12-10 MED ORDER — APIXABAN 5 MG PO TABS: 5.0000 mg | ORAL_TABLET | Freq: Two times a day (BID) | ORAL | 0 refills | Status: DC

## 2019-12-10 MED ORDER — APIXABAN 5 MG PO TABS: 5.0000 mg | ORAL_TABLET | Freq: Two times a day (BID) | ORAL | 1 refills | Status: DC

## 2019-12-10 NOTE — Telephone Encounter (Signed)
Yes thank you, we are working on her patient's but gradually to get him switched if they can.  We do actually have a pharmacist in house, she is on town this week but I will have her help and make sure he gets prescription assistance and gets it covered.

## 2019-12-10 NOTE — Telephone Encounter (Signed)
Okay sounds good, thank you for your help, let him know that if he does want his INR checked here we can get an appointment for him and get him checked to make sure it is below the 2.0.  If he needs

## 2019-12-10 NOTE — Telephone Encounter (Signed)
Thanks, he will keep his appointment on the 20th with you for INR check. I will give him that option on the 20th, but usually we switch all of our patients but just assuming when their INR will drop below 2.

## 2019-12-10 NOTE — Telephone Encounter (Signed)
Yes I have worked with Joshua [name] on multiple occassions. It is my understanding that this patient will be getting his Eliquis from the [facility] [place]. We have sent him in a RX to [facility] for a [name] 30 days and have sent him an Rx to get filled at the [facility] per his request.

## 2019-12-10 NOTE — Telephone Encounter (Signed)
Please review

## 2019-12-10 NOTE — Telephone Encounter (Signed)
I called and spoke with patient. I advised him NOT to stop taking his warfarin prior to his INR apt on 10/20. Dr. Warrick Parisian, or the pharmacists at [address] can advise him on how long to hold warfarin before starting Eliquis once his INR is known. I have called Rx into [facility] in [place]. Pt is aware not to start until directed. I have called pharmacy with copay free 30day card.  Will place written Rx for Eliquis in the mail for pt to take to [facility].

## 2019-12-11 NOTE — Telephone Encounter (Signed)
Ok perfect thanks

## 2019-12-16 ENCOUNTER — Ambulatory Visit (INDEPENDENT_AMBULATORY_CARE_PROVIDER_SITE_OTHER): Payer: Medicare PPO | Admitting: Family Medicine

## 2019-12-16 ENCOUNTER — Encounter: Payer: Self-pay | Admitting: Family Medicine

## 2019-12-16 ENCOUNTER — Other Ambulatory Visit: Payer: Self-pay

## 2019-12-16 VITALS — BP 122/61 | HR 58 | Temp 97.0°F | Ht 68.5 in | Wt 174.2 lb

## 2019-12-16 DIAGNOSIS — I480 Paroxysmal atrial fibrillation: Secondary | ICD-10-CM

## 2019-12-16 DIAGNOSIS — I4811 Longstanding persistent atrial fibrillation: Secondary | ICD-10-CM | POA: Diagnosis not present

## 2019-12-16 DIAGNOSIS — Z7901 Long term (current) use of anticoagulants: Secondary | ICD-10-CM

## 2019-12-16 LAB — COAGUCHEK XS/INR WAIVED
INR: 1.5 — ABNORMAL HIGH (ref 0.9–1.1)
Prothrombin Time: 18 s

## 2019-12-16 NOTE — Progress Notes (Signed)
BP 122/61   Pulse (!) 58   Temp (!) 97 F (36.1 C)   Ht 5' 8.5" (1.74 m)   Wt 174 lb 4 oz (79 kg)   SpO2 98%   BMI 26.11 kg/m    Subjective:   Patient ID: Joshua [name], male    DOB: 1945/04/12, 74 y.o.   MRN: [id number]  HPI: Joshua [name] is a 74 y.o. male presenting on 12/16/2019 for Medical Management of Chronic Issues and Atrial Fibrillation   HPI Patient is coming in today for A. fib and anticoagulation, where he tried to make a transition on to Eliquis.  Please go ahead and transition to it, INR 1.5 today, he says when he lumbar turnip greens and that is what brought it down.  Relevant past medical, surgical, family and social history reviewed and updated as indicated. Interim medical history since our last visit reviewed. Allergies and medications reviewed and updated.  Review of Systems  Constitutional: Negative for chills and fever.  Respiratory: Negative for shortness of breath and wheezing.   Cardiovascular: Positive for palpitations. Negative for chest pain and leg swelling.  Gastrointestinal: Negative for blood in stool.  Genitourinary: Negative for hematuria.  Musculoskeletal: Negative for back pain and gait problem.  Skin: Negative for rash.  All other systems reviewed and are negative.   Per HPI unless specifically indicated above   Allergies as of 12/16/2019      Reactions   Tetracycline Other (See Comments)   Issue with stomach lining - can take with food if needed      Medication List       Accurate as of December 16, 2019 11:54 AM. If you have any questions, ask your nurse or doctor.        STOP taking these medications   cephALEXin 500 MG capsule Commonly known as: KEFLEX Stopped by: [name] Gyneth [place]ka, MD     TAKE these medications   apixaban 5 MG Tabs tablet Commonly known as: ELIQUIS Take 1 tablet (5 mg total) by mouth 2 (two) times daily.   aspirin EC 81 MG tablet Take 81 mg by mouth at bedtime.   cholecalciferol 1000 units  tablet Commonly known as: VITAMIN D Take 1,000 Units by mouth daily.   cyanocobalamin 1000 MCG tablet Commonly known as: CVS VITAMIN B12 Take 1 tablet (1,000 mcg total) by mouth daily.   disopyramide 150 MG capsule Commonly known as: NORPACE Take 2 capsules (300 mg total) by mouth 2 (two) times daily.   furosemide 20 MG tablet Commonly known as: LASIX Take 20 mg by mouth daily as needed for fluid.   hydrochlorothiazide 25 MG tablet Commonly known as: HYDRODIURIL Take 0.5 tablets (12.5 mg total) by mouth as needed. What changed: when to take this   losartan 50 MG tablet Commonly known as: COZAAR Take 50 mg by mouth daily.   omeprazole 10 MG capsule Commonly known as: PRILOSEC Take 1 capsule (10 mg total) by mouth daily.   polyethylene glycol 17 g packet Commonly known as: MIRALAX / GLYCOLAX Take 17 g by mouth every other day.   rosuvastatin 10 MG tablet Commonly known as: CRESTOR Take 1 tablet (10 mg total) by mouth daily. What changed: when to take this   tamsulosin 0.4 MG Caps capsule Commonly known as: FLOMAX Take 1 capsule (0.4 mg total) by mouth daily. What changed: when to take this        Objective:   BP 122/61   Pulse Marland Kitchen)  58   Temp (!) 97 F (36.1 C)   Ht 5' 8.5" (1.74 m)   Wt 174 lb 4 oz (79 kg)   SpO2 98%   BMI 26.11 kg/m   Wt Readings from Last 3 Encounters:  12/16/19 174 lb 4 oz (79 kg)  10/29/19 177 lb 12.8 oz (80.6 kg)  10/27/19 174 lb 12.8 oz (79.3 kg)    Physical Exam Vitals and nursing note reviewed.  Constitutional:      General: He is not in acute distress.    Appearance: He is well-developed. He is not diaphoretic.  Eyes:     General: No scleral icterus.    Conjunctiva/sclera: Conjunctivae normal.  Neck:     Thyroid: No thyromegaly.  Cardiovascular:     Rate and Rhythm: Normal rate. Rhythm irregular.     Heart sounds: Normal heart sounds. No murmur heard.   Pulmonary:     Effort: Pulmonary effort is normal. No  respiratory distress.     Breath sounds: Normal breath sounds. No wheezing.  Musculoskeletal:     Cervical back: Neck supple.  Lymphadenopathy:     Cervical: No cervical adenopathy.  Skin:    General: Skin is warm and dry.     Findings: No rash.  Neurological:     Mental Status: He is alert and oriented to person, place, and time.     Coordination: Coordination normal.  Psychiatric:        Behavior: Behavior normal.     Results for orders placed or performed in visit on 12/16/19  CoaguChek XS/INR Waived  Result Value Ref Range   INR 1.5 (H) 0.9 - 1.1   Prothrombin Time 18.0 sec    Assessment & Plan:   Problem List Items Addressed This Visit      Cardiovascular and Mediastinum   FIBRILLATION, ATRIAL   PAF (paroxysmal atrial fibrillation) ([place])     Other   Long term (current) use of anticoagulants [Z79.01] - Primary   Relevant Orders   CoaguChek XS/INR Waived (Completed)      Description   Patient is to stop Coumadin and start Eliquis tomorrow.  INR today is 1.5 today ( goal is 2.0-3.0)       Follow up plan: No follow-ups on file.  Counseling provided for all of the vaccine components Orders Placed This Encounter  Procedures  . CoaguChek XS/INR [place], MD [facility] 12/16/2019, 11:54 AM

## 2019-12-16 NOTE — Telephone Encounter (Signed)
Just wanted to keep everyone in the loop, I had the INR check this morning and it was 1.5 Doctor [name] told me to start the Eliquis in the morning, 12-17-19.    I also received the prescription in the mail from Doctor Allred today.   So it looks like I am good to go to the [facility] [place] next Week.

## 2019-12-16 NOTE — Telephone Encounter (Signed)
Thank you for the update. Okay to continue Eliquis as prescribed and stop warfarin therapy.

## 2019-12-18 NOTE — Telephone Encounter (Signed)
Pt switched to Eliquis 10/21. Instructed to stop HCTZ and Norpace at least 3 days prior to admission.

## 2019-12-21 ENCOUNTER — Encounter: Payer: Self-pay | Admitting: Family Medicine

## 2020-01-08 ENCOUNTER — Other Ambulatory Visit: Payer: Self-pay

## 2020-01-08 ENCOUNTER — Other Ambulatory Visit (HOSPITAL_COMMUNITY)
Admission: RE | Admit: 2020-01-08 | Discharge: 2020-01-08 | Disposition: A | Discharge status: Home / Self Care | Payer: Medicare PPO | Source: Ambulatory Visit | Attending: Nurse Practitioner | Admitting: Nurse Practitioner

## 2020-01-08 DIAGNOSIS — Z01812 Encounter for preprocedural laboratory examination: Secondary | ICD-10-CM | POA: Diagnosis not present

## 2020-01-08 DIAGNOSIS — Z20822 Contact with and (suspected) exposure to covid-19: Secondary | ICD-10-CM | POA: Insufficient documentation

## 2020-01-08 LAB — SARS CORONAVIRUS 2 (TAT 6-24 HRS): SARS Coronavirus 2: NEGATIVE

## 2020-01-12 ENCOUNTER — Ambulatory Visit (HOSPITAL_COMMUNITY)
Admission: RE | Admit: 2020-01-12 | Discharge: 2020-01-12 | Disposition: A | Discharge status: Home / Self Care | Payer: Medicare PPO | Source: Ambulatory Visit | Attending: Nurse Practitioner | Admitting: Nurse Practitioner

## 2020-01-12 ENCOUNTER — Inpatient Hospital Stay (HOSPITAL_COMMUNITY)
Admission: AD | Admit: 2020-01-12 | Discharge: 2020-01-15 | DRG: 308 | Disposition: A | Discharge status: Home / Self Care | Payer: Medicare PPO | Source: Ambulatory Visit | Attending: Internal Medicine | Admitting: Internal Medicine

## 2020-01-12 ENCOUNTER — Encounter (HOSPITAL_COMMUNITY): Payer: Self-pay | Admitting: Nurse Practitioner

## 2020-01-12 ENCOUNTER — Other Ambulatory Visit: Payer: Self-pay

## 2020-01-12 VITALS — BP 136/46 | HR 63 | Ht 68.5 in | Wt 179.2 lb

## 2020-01-12 DIAGNOSIS — I2510 Atherosclerotic heart disease of native coronary artery without angina pectoris: Secondary | ICD-10-CM | POA: Diagnosis present

## 2020-01-12 DIAGNOSIS — I878 Other specified disorders of veins: Secondary | ICD-10-CM | POA: Diagnosis present

## 2020-01-12 DIAGNOSIS — I4819 Other persistent atrial fibrillation: Secondary | ICD-10-CM | POA: Diagnosis not present

## 2020-01-12 DIAGNOSIS — I4892 Unspecified atrial flutter: Secondary | ICD-10-CM | POA: Diagnosis not present

## 2020-01-12 DIAGNOSIS — Z955 Presence of coronary angioplasty implant and graft: Secondary | ICD-10-CM

## 2020-01-12 DIAGNOSIS — Z87891 Personal history of nicotine dependence: Secondary | ICD-10-CM | POA: Diagnosis not present

## 2020-01-12 DIAGNOSIS — Z83438 Family history of other disorder of lipoprotein metabolism and other lipidemia: Secondary | ICD-10-CM

## 2020-01-12 DIAGNOSIS — H9190 Unspecified hearing loss, unspecified ear: Secondary | ICD-10-CM | POA: Diagnosis not present

## 2020-01-12 DIAGNOSIS — K7460 Unspecified cirrhosis of liver: Secondary | ICD-10-CM | POA: Diagnosis present

## 2020-01-12 DIAGNOSIS — I480 Paroxysmal atrial fibrillation: Secondary | ICD-10-CM | POA: Diagnosis not present

## 2020-01-12 DIAGNOSIS — Z8349 Family history of other endocrine, nutritional and metabolic diseases: Secondary | ICD-10-CM | POA: Diagnosis not present

## 2020-01-12 DIAGNOSIS — Z825 Family history of asthma and other chronic lower respiratory diseases: Secondary | ICD-10-CM

## 2020-01-12 DIAGNOSIS — Z801 Family history of malignant neoplasm of trachea, bronchus and lung: Secondary | ICD-10-CM | POA: Diagnosis not present

## 2020-01-12 DIAGNOSIS — Z7901 Long term (current) use of anticoagulants: Secondary | ICD-10-CM

## 2020-01-12 DIAGNOSIS — Z86010 Personal history of colonic polyps: Secondary | ICD-10-CM

## 2020-01-12 DIAGNOSIS — J9601 Acute respiratory failure with hypoxia: Secondary | ICD-10-CM | POA: Diagnosis not present

## 2020-01-12 DIAGNOSIS — I5033 Acute on chronic diastolic (congestive) heart failure: Secondary | ICD-10-CM | POA: Diagnosis not present

## 2020-01-12 DIAGNOSIS — Z881 Allergy status to other antibiotic agents status: Secondary | ICD-10-CM | POA: Diagnosis not present

## 2020-01-12 DIAGNOSIS — Z800 Family history of malignant neoplasm of digestive organs: Secondary | ICD-10-CM | POA: Diagnosis not present

## 2020-01-12 DIAGNOSIS — D696 Thrombocytopenia, unspecified: Secondary | ICD-10-CM | POA: Diagnosis not present

## 2020-01-12 DIAGNOSIS — I482 Chronic atrial fibrillation, unspecified: Secondary | ICD-10-CM | POA: Diagnosis present

## 2020-01-12 DIAGNOSIS — I351 Nonrheumatic aortic (valve) insufficiency: Secondary | ICD-10-CM | POA: Diagnosis not present

## 2020-01-12 DIAGNOSIS — Z8249 Family history of ischemic heart disease and other diseases of the circulatory system: Secondary | ICD-10-CM

## 2020-01-12 DIAGNOSIS — I472 Ventricular tachycardia: Secondary | ICD-10-CM | POA: Diagnosis not present

## 2020-01-12 DIAGNOSIS — J449 Chronic obstructive pulmonary disease, unspecified: Secondary | ICD-10-CM | POA: Diagnosis not present

## 2020-01-12 DIAGNOSIS — Z823 Family history of stroke: Secondary | ICD-10-CM | POA: Diagnosis not present

## 2020-01-12 DIAGNOSIS — I110 Hypertensive heart disease with heart failure: Secondary | ICD-10-CM | POA: Diagnosis present

## 2020-01-12 DIAGNOSIS — E7800 Pure hypercholesterolemia, unspecified: Secondary | ICD-10-CM | POA: Diagnosis not present

## 2020-01-12 DIAGNOSIS — I421 Obstructive hypertrophic cardiomyopathy: Secondary | ICD-10-CM | POA: Diagnosis present

## 2020-01-12 DIAGNOSIS — D6869 Other thrombophilia: Secondary | ICD-10-CM

## 2020-01-12 DIAGNOSIS — I4891 Unspecified atrial fibrillation: Secondary | ICD-10-CM | POA: Diagnosis present

## 2020-01-12 LAB — BASIC METABOLIC PANEL
Anion gap: 7 (ref 5–15)
BUN: 25 mg/dL — ABNORMAL HIGH (ref 8–23)
CO2: 34 mmol/L — ABNORMAL HIGH (ref 22–32)
Calcium: 9.3 mg/dL (ref 8.9–10.3)
Chloride: 103 mmol/L (ref 98–111)
Creatinine, Ser: 1.05 mg/dL (ref 0.61–1.24)
GFR, Estimated: >60 mL/min (ref >60)
Glucose, Bld: 106 mg/dL — ABNORMAL HIGH (ref 70–99)
Potassium: 4.8 mmol/L (ref 3.5–5.1)
Sodium: 144 mmol/L (ref 135–145)

## 2020-01-12 LAB — MAGNESIUM: Magnesium: 2.3 mg/dL (ref 1.7–2.4)

## 2020-01-12 LAB — GLUCOSE, CAPILLARY: Glucose-Capillary: 90 mg/dL (ref 70–99)

## 2020-01-12 MED ORDER — FUROSEMIDE 10 MG/ML IJ SOLN
40.0000 mg | Freq: Once | INTRAMUSCULAR | Status: AC
  Administered 2020-01-12: 40 mg via INTRAVENOUS
  Filled 2020-01-12: qty 4

## 2020-01-12 MED ORDER — PANTOPRAZOLE SODIUM 40 MG PO TBEC: 40.0000 mg | DELAYED_RELEASE_TABLET | Freq: Every day | ORAL | Status: DC

## 2020-01-12 MED ORDER — SODIUM CHLORIDE 0.9 % IV SOLN: 250.0000 mL | INTRAVENOUS | Status: DC | PRN

## 2020-01-12 MED ORDER — TAMSULOSIN HCL 0.4 MG PO CAPS: 0.4000 mg | ORAL_CAPSULE | Freq: Every day | ORAL | Status: DC

## 2020-01-12 MED ORDER — LOSARTAN POTASSIUM 50 MG PO TABS: 50.0000 mg | ORAL_TABLET | Freq: Every day | ORAL | Status: DC

## 2020-01-12 MED ORDER — SODIUM CHLORIDE 0.9% FLUSH: 3.0000 mL | INTRAVENOUS | Status: DC | PRN

## 2020-01-12 MED ORDER — ROSUVASTATIN CALCIUM 5 MG PO TABS
10.0000 mg | ORAL_TABLET | Freq: Every evening | ORAL | Status: DC
  Administered 2020-01-12 – 2020-01-14 (×3): 10 mg via ORAL
  Filled 2020-01-12 (×2): qty 2

## 2020-01-12 MED ORDER — TAMSULOSIN HCL 0.4 MG PO CAPS
0.4000 mg | ORAL_CAPSULE | Freq: Every day | ORAL | Status: DC
  Administered 2020-01-12 – 2020-01-14 (×3): 0.4 mg via ORAL
  Filled 2020-01-12 (×3): qty 1

## 2020-01-12 MED ORDER — FUROSEMIDE 20 MG PO TABS: 20.0000 mg | ORAL_TABLET | Freq: Every day | ORAL | Status: DC | PRN

## 2020-01-12 MED ORDER — SODIUM CHLORIDE 0.9% FLUSH
3.0000 mL | Freq: Two times a day (BID) | INTRAVENOUS | Status: DC
  Administered 2020-01-12 – 2020-01-14 (×5): 3 mL via INTRAVENOUS

## 2020-01-12 MED ORDER — POTASSIUM CHLORIDE CRYS ER 20 MEQ PO TBCR
20.0000 meq | EXTENDED_RELEASE_TABLET | Freq: Once | ORAL | Status: AC
  Administered 2020-01-12: 20 meq via ORAL
  Filled 2020-01-12: qty 1

## 2020-01-12 MED ORDER — APIXABAN 5 MG PO TABS
5.0000 mg | ORAL_TABLET | Freq: Two times a day (BID) | ORAL | Status: DC
  Administered 2020-01-12 – 2020-01-15 (×6): 5 mg via ORAL
  Filled 2020-01-12 (×6): qty 1

## 2020-01-12 MED ORDER — DOFETILIDE 500 MCG PO CAPS
500.0000 ug | ORAL_CAPSULE | Freq: Two times a day (BID) | ORAL | Status: DC
  Administered 2020-01-12 – 2020-01-15 (×6): 500 ug via ORAL
  Filled 2020-01-12 (×6): qty 1

## 2020-01-12 NOTE — Progress Notes (Signed)
Patient had 11 beats VT at 1922.  Charge RN Jamal Collin, RN.  Patient asymptomatic.   Dr. Kalman Shan paged and aware.  States to [employer].

## 2020-01-12 NOTE — Progress Notes (Signed)
Pharmacy: Dofetilide (Tikosyn) - Initial Consult Assessment and Electrolyte Replacement  Pharmacy consulted to assist in monitoring and replacing electrolytes in this 74 y.o. male admitted on 01/12/2020 undergoing dofetilide initiation. First dofetilide dose planned 11/16.   Assessment:  Patient Exclusion Criteria: If any screening criteria checked as "Yes", then  patient  should NOT receive dofetilide until criteria item is corrected.  If Yes please indicate correction plan.  YES  NO Patient  Exclusion Criteria Correction Plan   []   [x]   Baseline QTc interval is greater than or equal to 440 msec. IF above YES box checked dofetilide contraindicated unless patient has ICD; then may proceed if QTc 500-550 msec or with known ventricular conduction abnormalities may proceed with QTc 550-600 msec. QTc = 440ms    []   [x]   Patient is known or suspected to have a digoxin level greater than 2 ng/ml: No results found for: DIGOXIN     []   [x]   Creatinine clearance less than 20 ml/min (calculated using Cockcroft-Gault, actual body weight and serum creatinine): Estimated Creatinine Clearance: 59.8 mL/min (by C-G formula based on SCr of 1.05 mg/dL).     []   [x]  Patient has received drugs known to prolong the QT intervals within the last 48 hours (phenothiazines, tricyclics or tetracyclic antidepressants, erythromycin, H-1 antihistamines, cisapride, fluoroquinolones, azithromycin, ondansetron).   Updated information on QT prolonging agents is available to be searched on the following database:QT prolonging agents     []   [x]   Patient received a dose of hydrochlorothiazide (Oretic) alone or in any combination including triamterene (Dyazide, Maxzide) in the last 48 hours.    []   [x]  Patient received a medication known to increase dofetilide plasma concentrations prior to initial dofetilide dose:   Trimethoprim (Primsol, Proloprim) in the last 36 hours  Verapamil (Calan, Verelan) in the  last 36 hours or a sustained release dose in the last 72 hours  Megestrol (Megace) in the last 5 days   Cimetidine (Tagamet) in the last 6 hours  Ketoconazole (Nizoral) in the last 24 hours  Itraconazole (Sporanox) in the last 48 hours   Prochlorperazine (Compazine) in the last 36 hours     []   [x]   Patient is known to have a history of torsades de pointes; congenital or acquired long QT syndromes.    []   [x]   Patient has received a Class 1 antiarrhythmic with less than 2 half-lives since last dose. (Disopyramide, Quinidine, Procainamide, Lidocaine, Mexiletine, Flecainide, Propafenone)    []   [x]   Patient has received amiodarone therapy in the past 3 months or amiodarone level is greater than 0.3 ng/ml.    Patient has been appropriately anticoagulated with apixaban.  Labs:    Component Value Date/Time   K 4.8 01/12/2020 1120   MG 2.3 01/12/2020 1120     Plan: Potassium: K >/= 4: Appropriate to initiate Tikosyn, no replacement needed    Magnesium: Mg >2: Appropriate to initiate Tikosyn, no replacement needed     Thank you for allowing pharmacy to participate in this patient's care   Erin Hearing PharmD., BCPS Clinical Pharmacist 01/12/2020 2:34 PM

## 2020-01-12 NOTE — Progress Notes (Signed)
Patient removed O2 to ambulate to bathroom.  sats 83% on RA when back to bed.  2L O2 [place] applied.  Sats 92%.

## 2020-01-12 NOTE — Progress Notes (Signed)
Patient had 2.22 sec. slow ventricular pause at 2119

## 2020-01-12 NOTE — Plan of Care (Signed)
Joshua [name] is scheduled to start dofetilide tonight for atrial fibrillation/flutter.  He is currently in atrial fibrillation with an acceptable QTc and labs, however this evening has had a relatively short-coupled run of MMVT (11 beats) and a brief run of PMVT (3 beats) in the setting of intermittent bradycardia. Given these findings, not previously documented, I will hold off on dofetilide tonight as the risk for PMVT may be prohibitive. Amiodarone and ablation were also discussed as alternatives. Will defer to [name] in the morning.

## 2020-01-12 NOTE — Progress Notes (Signed)
Primary Care Physician: [name], Joshua Kaufmann, MD Referring Physician: Dr. Rayann [name] Cardiologist: Dr. Julieta [name] Joshua [name] is a 74 y.o. male with a h/o CAD, HOCM, and atrial flutter, prior ablation 2012,  on norpace  for several years. He  saw Dr. Rayann [name] in August  for options of restoring SR  as he had returned to atrial flutter. Ablation, change in antiarrythmic (Tikosyn vrs amiodarone) were discussed with the pt as well as the option of staying on Norpace  and trying a cardioversion. He chose cardioversion. He did convert but unfortunately returned to atrial flutter. Dr. Rayann [name] did not think he would be an ideal candidate for ablation so antiarrythmic's were discussed. He chose [name].  He is  back in afib clinic, 11/16,  for [place] admission now that covid bed restrictions have been lifted. Has been off norpace  and HCTZ for over one week. He switched to eliquis from coumadin 3-4 weeks ago, no missed anticoagulation. Will be getting tikosyn thru the [facility]. Qtc stable. He feels he may need a few dose of lasix in the hospital for LLE.   he denies symptoms of palpitations, chest pain, shortness of breath, orthopnea, PND, lower extremity edema, dizziness, presyncope, syncope, or neurologic sequela. The patient is tolerating medications without difficulties and is otherwise without complaint today.   Past Medical History:  Diagnosis Date   Acute lower GI bleeding    related to gastritis / viral infection   Cataract    Cirrhosis ([place])    COPD (chronic obstructive pulmonary disease) ([place])    PFT 05-05-2010, FEV1 .9 (26%) ratio 60   Coronary artery disease    Gastritis 06-10-2006   Hiatal hernia 06-10-2006   EGD   HOH (hard of hearing)    Hx of adenomatous colonic polyps 2005, 2013   Colonoscopy   Hypercholesteremia    Hypertension    Hypertr obst cardiomyop    mild subaortic stenosis   Iron deficiency anemia, unspecified    LVH (left ventricular hypertrophy)    Other  B-complex deficiencies    Persistent atrial fibrillation (HCC)    persistent   Rheumatic fever    Thrombocytopenia ([place]) 05/2008   Past Surgical History:  Procedure Laterality Date   ATRIAL FLUTTER ABLATION  06/2010   CTI ablation performed by Dr. Rayann [name]   CARDIAC CATHETERIZATION  05/28/2005   Widely patent coronaries and normal LV function.   CARDIAC CATHETERIZATION  08/11/2001   80% LAD stenosis successfully stented with a 3.5x52mm Cypher DES postdilated to 3.71mm resulting in reduction of 80% to 0%.   CARDIAC CATHETERIZATION  01/15/2001   Focal 95% proximal RCA stenosis, stented with a 3x50mm Zeta multilink stent with postdilatation utilizing a 3.5x4mm Quantum balloon with stenosis being reduced to 0%.   CARDIOVASCULAR STRESS TEST  05/29/2011   No scintigraphic evidence of inducible myocardial ischemia. No ECG changes. EKG negative for ischemia.   CARDIOVERSION N/A 10/20/2019   Procedure: CARDIOVERSION;  Surgeon: Joshua Klein, MD;  [facility]: [facility];  Service: Cardiovascular;  Laterality: N/A;   CORONARY ANGIOPLASTY     2002-2003   SHOULDER SURGERY     TRANSTHORACIC ECHOCARDIOGRAM  05/29/2011   EF >70%, severe concentric LV hypertrophy, severe mitral annular calcification, mild-moderate aortic regurg,     Current Outpatient Medications  Medication Sig Dispense Refill   apixaban (ELIQUIS) 5 MG TABS tablet Take 1 tablet (5 mg total) by mouth 2 (two) times daily. 180 tablet 1   cholecalciferol (VITAMIN D) 1000 units  tablet Take 1,000 Units by mouth daily.     cyanocobalamin ([employer]S VITAMIN B12) 1000 MCG tablet Take 1 tablet (1,000 mcg total) by mouth daily.     losartan (COZAAR) 50 MG tablet Take 50 mg by mouth daily.     omeprazole (PRILOSEC) 10 MG capsule Take 1 capsule (10 mg total) by mouth daily. 90 capsule 3   rosuvastatin (CRESTOR) 10 MG tablet Take 1 tablet (10 mg total) by mouth daily. (Patient taking differently: Take 10 mg by mouth every evening. ) 90 tablet  3   tamsulosin (FLOMAX) 0.4 MG CAPS capsule Take 1 capsule (0.4 mg total) by mouth daily. (Patient taking differently: Take 0.4 mg by mouth at bedtime. ) 90 capsule 3   furosemide (LASIX) 20 MG tablet Take 20 mg by mouth daily as needed for fluid.  (Patient not taking: Reported on 01/12/2020)     polyethylene glycol (MIRALAX / GLYCOLAX) packet Take 17 g by mouth every other day. (Patient not taking: Reported on 01/12/2020)     No current facility-administered medications for this encounter.    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  Tobacco Use   Smoking status: Former Smoker    Packs/day: 1.00    Years: 10.00    Pack years: 10.00    Types: Cigarettes    Quit date: 02/26/1985    Years since quitting: 34.8   Smokeless tobacco: Never Used  Vaping Use   Vaping Use: Never used  Substance and Sexual Activity   Alcohol use: No   Drug use: No   Sexual activity: Not on file  Other Topics Concern   Not on file  Social History Narrative   Not on file   Social Determinants of Health   Financial Resource Strain:    Difficulty of Paying Living Expenses: Not on file  Food Insecurity:    Worried About Charity fundraiser in the Last Year: Not on file   [employer] of Food in the Last Year: Not on file  Transportation Needs:    Lack of Transportation (Medical): Not on file   Lack of Transportation (Non-Medical): Not on file  Physical Activity:    Days of Exercise per Week: Not on file   Minutes of Exercise per Session: Not on file  Stress:    Feeling of Stress : Not on file  Social Connections:     Frequency of Communication with Friends and Family: Not on file   Frequency of Social Gatherings with Friends and Family: Not on file   Attends Religious Services: Not on file   Active Member of Clubs or Organizations: Not on file   Attends Archivist Meetings: Not on file   Marital Status: Not on file  Intimate Partner Violence:    Fear of Current or Ex-Partner: Not on file   Emotionally Abused: Not on file   Physically Abused: Not on file   Sexually Abused: Not on file    Family History  Problem Relation Age of Onset   Heart attack Brother 43  Ventircular rupture   Emphysema Brother    Lung cancer Brother    Heart disease Mother        Enlarged heart   Stroke Mother    Hyperlipidemia Father    CAD Father    Hypertension Sister    CAD Paternal Uncle    Stroke Paternal Uncle    Cancer Maternal Grandfather        colon   Colon cancer Maternal Grandfather    Asthma Brother     ROS- All systems are reviewed and negative except as per the HPI above  Physical Exam: Vitals:   01/12/20 1118  BP: (!) 136/46  Pulse: 63  Weight: 81.3 kg  Height: 5' 8.5" (1.74 m)   Wt Readings from Last 3 Encounters:  01/12/20 81.3 kg  12/16/19 79 kg  10/29/19 80.6 kg    Labs: Lab Results  Component Value Date   NA 140 10/20/2019   K 4.8 10/20/2019   CL 98 10/20/2019   CO2 35 (H) 09/09/2019   GLUCOSE 65 (L) 10/20/2019   BUN 28 (H) 10/20/2019   CREATININE 1.00 10/20/2019   CALCIUM 8.9 09/09/2019   MG 2.3 06/12/2019   Lab Results  Component Value Date   INR 1.5 (H) 12/16/2019   Lab Results  Component Value Date   CHOL 128 03/11/2019   HDL 35 (L) 03/11/2019   LDLCALC 74 03/11/2019   TRIG 103 03/11/2019     GEN- The patient is well appearing, alert and oriented x 3 today.   Head- normocephalic, atraumatic Eyes-  Sclera clear, conjunctiva pink Ears- hearing intact Oropharynx- clear Neck- supple, no JVP Lymph- no cervical  lymphadenopathy Lungs- Clear to ausculation bilaterally, normal work of breathing Heart- irregular rate and rhythm, no murmurs, rubs or gallops, PMI not laterally displaced GI- soft, NT, ND, + BS Extremities- no clubbing, cyanosis, or edema MS- no significant deformity or atrophy Skin- no rash or lesion Psych- euthymic mood, full affect Neuro- strength and sensation are intact  EKG-aflutter at 63 bpm, qrs int 124 ms, qtc 435 ms    Assessment and Plan: 1. Atrial  flutter  Successful cardioversion but with ERAF in August  We discussed options from Dr. Jackalyn Lombard note He  preferred Tikosyn after norpace washout now that  elective beds are available He  stopped hctz and norpace over  one week ago  No benadryl use No  hctz use Bmet/mag in range for tikosyn admission  Qt acceptable  covid negative Has had covid vaccines  /booster/flu shot  He plans to get drug thru the [place] He uses lasix prn, has not used recently, feels that he may need a few doses while in the hospital for LLE   2. CHA2DS2VASc score of 5 He is on eliquis 5 mg bid for over 3 weeks without missed does(previoulsy on coumadin)     To 6 E when bed available   Butch Penny C. Jennifer Holland, [facility] [address] [place], [place] [address] [phone]       Primary Care Physician: [name], Joshua Kaufmann, MD Referring Physician: Dr. Rayann [name] Cardiologist: Dr. Julieta [name] Joshua [name] is a 74 y.o. male with a h/o CAD, HOCM, and atrial flutter, prior ablation 2012,  on norpace  for several years. He recently saw Dr. Rayann [name] for options of restoring SR  as he had returned to atrial flutter. Ablation, change in antiarrythmic( Tikosyn vrs amiodarone) were discussed with the pt as well as the option  of staying on Norpace  and trying a cardioversion. He chose cardioversion. He did convert but unfortunately returned to atrial flutter. Dr. Rayann [name] did not think he would be an ideal candidate for ablation so  antiarrythmic's are  discussed today. He is interested in an [place] admission after Norpace wash out and when elective admissions restriction is lifted  due to the bed situation with Covid-19 . He does not use benadryl. He has prn  HCTZ on his med list but states that he never uses it.   He is interested in changing from  warfarin to [place] and will let his coumadin clinic at [facility] know of his wishes to help him transfer. He gets his drugs thru the [place] so cost will not  be an issue.  Today, he denies symptoms of palpitations, chest pain, shortness of breath, orthopnea, PND, lower extremity edema, dizziness, presyncope, syncope, or neurologic sequela. The patient is tolerating medications without difficulties and is otherwise without complaint today.   Past Medical History:  Diagnosis Date   Acute lower GI bleeding    related to gastritis / viral infection   Cataract    Cirrhosis ([place])    COPD (chronic obstructive pulmonary disease) ([place])    PFT 05-05-2010, FEV1 .9 (26%) ratio 60   Coronary artery disease    Gastritis 06-10-2006   Hiatal hernia 06-10-2006   EGD   HOH (hard of hearing)    Hx of adenomatous colonic polyps 2005, 2013   Colonoscopy   Hypercholesteremia    Hypertension    Hypertr obst cardiomyop    mild subaortic stenosis   Iron deficiency anemia, unspecified    LVH (left ventricular hypertrophy)    Other B-complex deficiencies    Persistent atrial fibrillation (HCC)    persistent   Rheumatic fever    Thrombocytopenia ([place]) 05/2008   Past Surgical History:  Procedure Laterality Date   ATRIAL FLUTTER ABLATION  06/2010   CTI ablation performed by Dr. Rayann [name]   CARDIAC CATHETERIZATION  05/28/2005   Widely patent coronaries and normal LV function.   CARDIAC CATHETERIZATION  08/11/2001   80% LAD stenosis successfully stented with a 3.5x67mm Cypher DES postdilated to 3.79mm resulting in reduction of 80% to 0%.   CARDIAC CATHETERIZATION  01/15/2001    Focal 95% proximal RCA stenosis, stented with a 3x4mm Zeta multilink stent with postdilatation utilizing a 3.5x65mm Quantum balloon with stenosis being reduced to 0%.   CARDIOVASCULAR STRESS TEST  05/29/2011   No scintigraphic evidence of inducible myocardial ischemia. No ECG changes. EKG negative for ischemia.   CARDIOVERSION N/A 10/20/2019   Procedure: CARDIOVERSION;  Surgeon: Joshua Klein, MD;  [facility];  Service: Cardiovascular;  Laterality: N/A;   CORONARY ANGIOPLASTY     2002-2003   SHOULDER SURGERY     TRANSTHORACIC ECHOCARDIOGRAM  05/29/2011   EF >70%, severe concentric LV hypertrophy, severe mitral annular calcification, mild-moderate aortic regurg,     Current Outpatient Medications  Medication Sig Dispense Refill   apixaban (ELIQUIS) 5 MG TABS tablet Take 1 tablet (5 mg total) by mouth 2 (two) times daily. 180 tablet 1   cholecalciferol (VITAMIN D) 1000 units tablet Take 1,000 Units by mouth daily.     cyanocobalamin (CVS VITAMIN B12) 1000 MCG tablet Take 1 tablet (1,000 mcg total) by mouth daily.     losartan (COZAAR) 50 MG tablet Take 50 mg by mouth daily.     omeprazole (PRILOSEC) 10 MG capsule Take 1 capsule (10 mg total) by mouth  daily. 90 capsule 3   rosuvastatin (CRESTOR) 10 MG tablet Take 1 tablet (10 mg total) by mouth daily. (Patient taking differently: Take 10 mg by mouth every evening. ) 90 tablet 3   tamsulosin (FLOMAX) 0.4 MG CAPS capsule Take 1 capsule (0.4 mg total) by mouth daily. (Patient taking differently: Take 0.4 mg by mouth at bedtime. ) 90 capsule 3   furosemide (LASIX) 20 MG tablet Take 20 mg by mouth daily as needed for fluid.  (Patient not taking: Reported on 01/12/2020)     polyethylene glycol (MIRALAX / GLYCOLAX) packet Take 17 g by mouth every other day. (Patient not taking: Reported on 01/12/2020)     No current facility-administered medications for this encounter.    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  Tobacco Use   Smoking status: Former Smoker    Packs/day: 1.00    Years: 10.00    Pack years: 10.00    Types: Cigarettes    Quit date: 02/26/1985    Years since quitting: 34.8   Smokeless tobacco: Never Used  Vaping Use   Vaping Use: Never used  Substance and Sexual Activity   Alcohol use: No   Drug use: No   Sexual activity: Not on file  Other Topics Concern   Not on file  Social History Narrative   Not on file   Social Determinants of Health   Financial Resource Strain:    Difficulty of Paying Living Expenses: Not on file  Food Insecurity:    Worried About Charity fundraiser in the Last Year: Not on file   [employer] of Food in the Last Year: Not on file  Transportation Needs:    Lack of Transportation (Medical): Not on file   Lack of Transportation (Non-Medical): Not on file  Physical Activity:    Days of Exercise per Week: Not on file   Minutes of Exercise per Session: Not on file  Stress:    Feeling of Stress : Not on file  Social Connections:    Frequency of Communication with Friends and Family: Not on file   Frequency of Social Gatherings with Friends and Family: Not on file   Attends Religious Services: Not on file   Active Member of Clubs or Organizations: Not on file   Attends Archivist Meetings: Not on file   Marital Status: Not on file  Intimate Partner Violence:    Fear of Current or Ex-Partner: Not on file   Emotionally Abused: Not on file   Physically Abused: Not on file   Sexually Abused: Not on file    Family History  Problem Relation Age of Onset    Heart attack Brother 29       Ventircular rupture   Emphysema Brother    Lung cancer Brother    Heart disease Mother        Enlarged heart   Stroke Mother    Hyperlipidemia Father    CAD Father    Hypertension Sister    CAD Paternal Uncle  Stroke Paternal Uncle    Cancer Maternal Grandfather        colon   Colon cancer Maternal Grandfather    Asthma Brother     ROS- All systems are reviewed and negative except as per the HPI above  Physical Exam: Vitals:   01/12/20 1118  BP: (!) 136/46  Pulse: 63  Weight: 81.3 kg  Height: 5' 8.5" (1.74 m)   Wt Readings from Last 3 Encounters:  01/12/20 81.3 kg  12/16/19 79 kg  10/29/19 80.6 kg    Labs: Lab Results  Component Value Date   NA 140 10/20/2019   K 4.8 10/20/2019   CL 98 10/20/2019   CO2 35 (H) 09/09/2019   GLUCOSE 65 (L) 10/20/2019   BUN 28 (H) 10/20/2019   CREATININE 1.00 10/20/2019   CALCIUM 8.9 09/09/2019   MG 2.3 06/12/2019   Lab Results  Component Value Date   INR 1.5 (H) 12/16/2019   Lab Results  Component Value Date   CHOL 128 03/11/2019   HDL 35 (L) 03/11/2019   LDLCALC 74 03/11/2019   TRIG 103 03/11/2019     GEN- The patient is well appearing, alert and oriented x 3 today.   Head- normocephalic, atraumatic Eyes-  Sclera clear, conjunctiva pink Ears- hearing intact Oropharynx- clear Neck- supple, no JVP Lymph- no cervical lymphadenopathy Lungs- Clear to ausculation bilaterally, normal work of breathing Heart- irregular rate and rhythm, no murmurs, rubs or gallops, PMI not laterally displaced GI- soft, NT, ND, + BS Extremities- no clubbing, cyanosis, or edema MS- no significant deformity or atrophy Skin- no rash or lesion Psych- euthymic mood, full affect Neuro- strength and sensation are intact  EKG-aflutter at 60 bpm, qrs int 148 ms, qtc 472 ms    Assessment and Plan: 1. Atrial  flutter  Successful cardioversion but is now back in flutter Was not aware but state  he felt great the first day after cardioversion  We discussed options form Dr. Jackalyn Lombard note He would prefer Tikosyn after norpace washout when elective beds are available Will send meds to pharmD for  screening  No benadryl use He has hctz prn, he states that he does not use Last K+/mag in Epic in range  He plans to get drug thru the [place]  2. CHA2DS2VASc score of 5 He wishes to tranfer from  warfarin to [facility] I will let [facility] [place] coumadin clinic be aware to help transition   We will be in touch when elective bed admission restriction is lifted for tikosyn admit  Butch Penny C. Dorthea Maina, [facility] [address] [place], [place] [address] [phone]

## 2020-01-13 ENCOUNTER — Encounter (HOSPITAL_COMMUNITY): Payer: Self-pay | Admitting: Anesthesiology

## 2020-01-13 DIAGNOSIS — I4819 Other persistent atrial fibrillation: Secondary | ICD-10-CM | POA: Diagnosis not present

## 2020-01-13 DIAGNOSIS — I5033 Acute on chronic diastolic (congestive) heart failure: Secondary | ICD-10-CM | POA: Diagnosis not present

## 2020-01-13 LAB — BASIC METABOLIC PANEL
Anion gap: 8 (ref 5–15)
BUN: 26 mg/dL — ABNORMAL HIGH (ref 8–23)
CO2: 33 mmol/L — ABNORMAL HIGH (ref 22–32)
Calcium: 8.6 mg/dL — ABNORMAL LOW (ref 8.9–10.3)
Chloride: 101 mmol/L (ref 98–111)
Creatinine, Ser: 1.2 mg/dL (ref 0.61–1.24)
GFR, Estimated: >60 mL/min (ref >60)
Glucose, Bld: 101 mg/dL — ABNORMAL HIGH (ref 70–99)
Potassium: 4.9 mmol/L (ref 3.5–5.1)
Sodium: 142 mmol/L (ref 135–145)

## 2020-01-13 LAB — BRAIN NATRIURETIC PEPTIDE: B Natriuretic Peptide: 290.6 pg/mL — ABNORMAL HIGH (ref 0.0–100.0)

## 2020-01-13 LAB — PROTIME-INR
INR: 1.3 — ABNORMAL HIGH (ref 0.8–1.2)
Prothrombin Time: 15.5 seconds — ABNORMAL HIGH (ref 11.4–15.2)

## 2020-01-13 LAB — MAGNESIUM: Magnesium: 2.1 mg/dL (ref 1.7–2.4)

## 2020-01-13 MED ORDER — PANTOPRAZOLE SODIUM 40 MG PO TBEC
40.0000 mg | DELAYED_RELEASE_TABLET | Freq: Every day | ORAL | Status: DC
  Administered 2020-01-13 – 2020-01-15 (×3): 40 mg via ORAL
  Filled 2020-01-13 (×3): qty 1

## 2020-01-13 MED ORDER — FUROSEMIDE 10 MG/ML IJ SOLN
40.0000 mg | Freq: Once | INTRAMUSCULAR | Status: AC
  Administered 2020-01-13: 40 mg via INTRAVENOUS
  Filled 2020-01-13: qty 4

## 2020-01-13 MED ORDER — LOSARTAN POTASSIUM 50 MG PO TABS
50.0000 mg | ORAL_TABLET | Freq: Every day | ORAL | Status: DC
  Administered 2020-01-13 – 2020-01-15 (×3): 50 mg via ORAL
  Filled 2020-01-13 (×3): qty 1

## 2020-01-13 NOTE — Progress Notes (Addendum)
Electrophysiology Rounding Note  Patient Name: Joshua [name] Date of Encounter: 01/13/2020  Primary Cardiologist: Shelva Majestic, MD  Electrophysiologist: [name]   Subjective   Pt remains in afib on Tikosyn 500 mcg BID   QTc from EKG last pm shows stable QTc at ~440 ms  The patient is doing well today. He states he slept better than he had in a week. Had been sitting up at home with orthopnea. New O2 requirement noted.   Inpatient Medications    Scheduled Meds: . apixaban  5 mg Oral BID  . dofetilide  500 mcg Oral BID  . losartan  50 mg Oral Daily  . pantoprazole  40 mg Oral Daily  . rosuvastatin  10 mg Oral QPM  . sodium chloride flush  3 mL Intravenous Q12H  . tamsulosin  0.4 mg Oral QHS   Continuous Infusions: . sodium chloride     PRN Meds: sodium chloride, furosemide, sodium chloride flush   Vital Signs    Vitals:   01/12/20 1601 01/12/20 1736 01/12/20 2001 01/13/20 0443  BP: 107/83  (!) 139/47 (!) 96/50  Pulse: 69 85 69 (!) 56  Resp: 20 17 20 20   Temp:   98.3 F (36.8 C) 98.5 F (36.9 C)  TempSrc:   Oral Oral  SpO2: 95% 93% 90% 93%  Weight:    78.5 kg  Height:        Intake/Output Summary (Last 24 hours) at 01/13/2020 0719 Last data filed at 01/12/2020 2015 Gross per 24 hour  Intake --  Output 1725 ml  Net -1725 ml   Filed Weights   01/12/20 1435 01/13/20 0443  Weight: 80.2 kg 78.5 kg    Physical Exam    GEN- The patient is well appearing, alert and oriented x 3 today.   Head- normocephalic, atraumatic Eyes-  Sclera clear, conjunctiva pink Ears- hearing intact Oropharynx- clear Neck- supple Lungs- Clear to ausculation bilaterally, normal work of breathing Heart- Regular rate and rhythm, no murmurs, rubs or gallops GI- soft, NT, ND, + BS Extremities- no clubbing, cyanosis, or edema Skin- no rash or lesion Psych- euthymic mood, full affect Neuro- strength and sensation are intact  Labs    CBC No results for input(s): WBC,  NEUTROABS, HGB, HCT, MCV, PLT in the last 72 hours. Basic Metabolic Panel Recent Labs    01/12/20 1120 01/13/20 0402  NA 144 142  K 4.8 4.9  CL 103 101  CO2 34* 33*  GLUCOSE 106* 101*  BUN 25* 26*  CREATININE 1.05 1.20  CALCIUM 9.3 8.6*  MG 2.3 2.1    Potassium  Date/Time Value Ref Range Status  01/13/2020 04:02 AM 4.9 3.5 - 5.1 mmol/L Final   Magnesium  Date/Time Value Ref Range Status  01/13/2020 04:02 AM 2.1 1.7 - 2.4 mg/dL Final    Comment:    Performed at [facility], [place] [address] [place], [place] [id number]    Telemetry    AF in 50-70s. NSVT noted that preceded tikosyn. Occasional pause noted ~2 seconds.  (personally reviewed)  Radiology    No results found.   Patient Profile     Joshua [name] is a 74 y.o. male with a past medical history significant for persistent atrial fibrillation.  They were admitted for tikosyn load.   Assessment & Plan    1. Persistent atrial fibrillation Pt remains in afib on Tikosyn 500 mcg BID  Continue Eliquis Electrolytes stable.  CHA2DS2VASC is at least 4.  2. Acute on chronic diastolic CHF Negative 1.7 L and down 4 lbs with dose of IV lasix yesterday.  Remains somewhat orthopneic, but improved. BNP slightly elevated.  Will repeat lasix today.  Briefly discussed Alleviate HF but pt would prefer to hold off for now.   If pt does not convert chemically, plan on DCCV tomorrow   For questions or updates, please contact [place] Please consult [email or web address] for contact info under Cardiology/STEMI.  Signed, Shirley Friar, PA-C  01/13/2020, 7:19 AM    I have seen, examined the patient, and reviewed the above assessment and plan.  Changes to above are made where necessary.  On exam, iRRR.  Breathing a little bit better overnight after IV diuresis.  Remains in afib.  Continue another 24 hours of IV diuresis.  Co Sign: Thompson Grayer, MD

## 2020-01-13 NOTE — Progress Notes (Signed)
Received call from Oda Kilts, [place] stating to GIVE tonight's dose of Tikosyn.

## 2020-01-13 NOTE — Anesthesia Preprocedure Evaluation (Deleted)
Anesthesia Evaluation    Reviewed: Allergy & Precautions, Patient's Chart, lab work & pertinent test results  Airway        Dental   Pulmonary former smoker,           Cardiovascular hypertension, Pt. on medications + CAD  + dysrhythmias Atrial Fibrillation  Rhythm:Irregular Rate:Abnormal  10/08/19 Echo 1. The severe MAC projects into the LVOT, causing flow turbulence.  However, no significant LVOT gradient noted.. Left ventricular ejection  fraction, by estimation, is 60 to 65%. The left ventricle has normal  function. The left ventricle has no regional  wall motion abnormalities. There is severe concentric left ventricular  hypertrophy. Left ventricular diastolic function could not be evaluated.  2. Right ventricular systolic function is low normal. The right  ventricular size is mildly enlarged. There is mildly elevated pulmonary  artery systolic pressure.    Neuro/Psych    GI/Hepatic hiatal hernia, (+) Cirrhosis       ,   Endo/Other    Renal/GU negative Renal ROSK+ 4.9 Cr 1.20     Musculoskeletal   Abdominal   Peds  Hematology   Anesthesia Other Findings   Reproductive/Obstetrics                            Anesthesia Physical Anesthesia Plan  ASA: III  Anesthesia Plan: General   Post-op Pain Management:    Induction:   PONV Risk Score and Plan: Treatment may vary due to age or medical condition  Airway Management Planned: Awake Intubation Planned and Fiberoptic Intubation Planned  Additional Equipment: None  Intra-op Plan:   Post-operative Plan:   Informed Consent:     Dental advisory given  Plan Discussed with:   Anesthesia Plan Comments:         Anesthesia Quick Evaluation

## 2020-01-13 NOTE — Care Management (Signed)
1304  01-13-20 Benefits check submitted for Tikosyn. Case Manager will follow for cost and see what pharmacy patients wants to utilize. Bethena Roys, RN,BSN Case Manager

## 2020-01-13 NOTE — Progress Notes (Signed)
Patient had 2.33 sec SVR per CMT.

## 2020-01-13 NOTE — H&P (View-Only) (Signed)
Morning EKG reviewed  Shows pt remains in afib at 60 bpm with stable QTc at ~470 ms.  Continue Tikosyn 500 mcg BID.   Pt will be NPO after midnight for DCCV if remains in [place], [place]  Pager: [phone]  01/13/2020 11:02 AM

## 2020-01-13 NOTE — Progress Notes (Signed)
Patient had SVR with HR drop to 39 per CMT.

## 2020-01-13 NOTE — H&P (Signed)
Electrophysiology H&P  Note    Primary Care Physician: [name], [name], MD Referring Physician: [name] Cardiologist: Dr. Julieta Bellini Joshua [name] is a 74 y.o. male with a h/o CAD, HOCM, and atrial flutter, prior ablation 2012,  on norpace  for several years. He  saw [name] in August  for options of restoring SR  as he had returned to atrial flutter. Ablation, change in antiarrythmic (Tikosyn vrs amiodarone) were discussed with the pt as well as the option of staying on Norpace  and trying a cardioversion. He chose cardioversion. He did convert but unfortunately returned to atrial flutter. [name] did not think he would be an ideal candidate for ablation so antiarrythmic's were discussed. He chose [name].  He is  back in afib clinic, 11/16,  for [place] admission now that covid bed restrictions have been lifted. Has been off norpace  and HCTZ for over one week. He switched to eliquis from coumadin 3-4 weeks ago, no missed anticoagulation. Will be getting [facility]osyn thru the [facility]. Qtc stable. He feels he may need a few dose of lasix in the hospital for LLE.   he denies symptoms of palpitations, chest pain, shortness of breath, orthopnea, PND, lower extremity edema, dizziness, presyncope, syncope, or neurologic sequela. The patient is tolerating medications without difficulties and is otherwise without complaint today.   Past Medical History:  Diagnosis Date  . Acute lower GI bleeding    related to gastritis / viral infection  . Cataract   . Cirrhosis ([place])   . COPD (chronic obstructive pulmonary disease) ([place])    PFT 05-05-2010, FEV1 .9 (26%) ratio 60  . Coronary artery disease   . Gastritis 06-10-2006  . Hiatal hernia 06-10-2006   EGD  . HOH (hard of hearing)   . Hx of adenomatous colonic polyps 2005, 2013   Colonoscopy  . Hypercholesteremia   . Hypertension   . Hypertr obst cardiomyop    mild subaortic stenosis  . Iron deficiency anemia, unspecified   . LVH (left  ventricular hypertrophy)   . Other B-complex deficiencies   . Persistent atrial fibrillation (HCC)    persistent  . Rheumatic fever   . Thrombocytopenia ([place]) 05/2008   Past Surgical History:  Procedure Laterality Date  . ATRIAL FLUTTER ABLATION  06/2010   CTI ablation performed by [name]  . CARDIAC CATHETERIZATION  05/28/2005   Widely patent coronaries and normal LV function.  Marland Kitchen CARDIAC CATHETERIZATION  08/11/2001   80% LAD stenosis successfully stented with a 3.5x1mm Cypher DES postdilated to 3.63mm resulting in reduction of 80% to 0%.  . CARDIAC CATHETERIZATION  01/15/2001   Focal 95% proximal RCA stenosis, stented with a 3x46mm Zeta multilink stent with postdilatation utilizing a 3.5x45mm Quantum balloon with stenosis being reduced to 0%.  . CARDIOVASCULAR STRESS TEST  05/29/2011   No scintigraphic evidence of inducible myocardial ischemia. No ECG changes. EKG negative for ischemia.  Marland Kitchen CARDIOVERSION N/A 10/20/2019   Procedure: CARDIOVERSION;  Surgeon: Sanda Klein, MD;  Location: [place];  Service: Cardiovascular;  Laterality: N/A;  . CORONARY ANGIOPLASTY     2002-2003  . SHOULDER SURGERY    . TRANSTHORACIC ECHOCARDIOGRAM  05/29/2011   EF >70%, severe concentric LV hypertrophy, severe mitral annular calcification, mild-moderate aortic regurg,     Current Facility-Administered Medications  Medication Dose Route Frequency Provider Last Rate Last Admin  . 0.9 %  sodium chloride infusion  250 mL Intravenous PRN Sherran Needs, NP      .  apixaban (ELIQUIS) tablet 5 mg  5 mg Oral BID Sherran Needs, NP   5 mg at 01/12/20 1751  . dofetilide (TIKOSYN) capsule 500 mcg  500 mcg Oral BID Sherran Needs, NP   500 mcg at 01/12/20 2210  . furosemide (LASIX) injection 40 mg  40 mg Intravenous Once Shirley Friar, PA-C      . losartan (COZAAR) tablet 50 mg  50 mg Oral Daily Sherran Needs, NP      . pantoprazole (PROTONIX) EC tablet 40 mg  40 mg Oral Daily Roderic Palau C,  NP      . rosuvastatin (CRESTOR) tablet 10 mg  10 mg Oral QPM Sherran Needs, NP   10 mg at 01/12/20 2112  . sodium chloride flush (NS) 0.9 % injection 3 mL  3 mL Intravenous Q12H Sherran Needs, NP   3 mL at 01/12/20 2113  . sodium chloride flush (NS) 0.9 % injection 3 mL  3 mL Intravenous PRN Sherran Needs, NP      . tamsulosin (FLOMAX) capsule 0.4 mg  0.4 mg Oral QHS Thompson Grayer, MD   0.4 mg at 01/12/20 2111    Allergies  Allergen Reactions  . Tetracycline Other (See Comments)    Issue with stomach lining - can take with food if needed    Social History   Socioeconomic History  . Marital status: Widowed    Spouse name: Not on file  . Number of children: 5  . Years of education: Not on file  . Highest education level: Not on file  Occupational History  . 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 Smoking status: Former Smoker    Packs/day: 1.00    Years: 10.00    Pack years: 10.00    Types: Cigarettes    Quit date: 02/26/1985    Years since quitting: 34.9  . Smokeless tobacco: Never Used  Vaping Use  . Vaping Use: Never used  Substance and Sexual Activity  . Alcohol use: No  . Drug use: No  . Sexual activity: Not on file  Other Topics Concern  . Not on file  Social History Narrative  . Not on file   Social Determinants of Health   Financial Resource Strain:   . Difficulty of Paying Living Expenses: Not on file  Food Insecurity:   . Worried About Charity fundraiser in the Last Year: Not on file  . Ran Out of Food in the Last Year: Not on file  Transportation Needs:   . Lack of Transportation (Medical): Not on file  . Lack of Transportation (Non-Medical): Not on file  Physical Activity:   . Days of Exercise per Week: Not on file  . Minutes of Exercise per Session: Not on file  Stress:   . Feeling of Stress : Not on file  Social Connections:   . Frequency of Communication with Friends and Family: Not on file  .  Frequency of Social Gatherings with Friends and Family: Not on file  . Attends Religious Services: Not on file  . Active Member of Clubs or Organizations: Not on file  . Attends Archivist Meetings: Not on file  . Marital Status: Not on file  Intimate Partner Violence:   . Fear of Current or Ex-Partner: Not on file  . Emotionally Abused: Not on file  . Physically Abused: Not on file  . Sexually Abused: Not on file  Family History  Problem Relation Age of Onset  . Heart attack Brother 72       Ventircular rupture  . Emphysema Brother   . Lung cancer Brother   . Heart disease Mother        Enlarged heart  . Stroke Mother   . Hyperlipidemia Father   . CAD Father   . Hypertension Sister   . CAD Paternal Uncle   . Stroke Paternal Uncle   . Cancer Maternal Grandfather        colon  . Colon cancer Maternal Grandfather   . Asthma Brother     ROS- All systems are reviewed and negative except as per the HPI above  Physical Exam: Vitals:   01/12/20 1601 01/12/20 1736 01/12/20 2001 01/13/20 0443  BP: 107/83  (!) 139/47 (!) 96/50  Pulse: 69 85 69 (!) 56  Resp: 20 17 20 20   Temp:   98.3 F (36.8 C) 98.5 F (36.9 C)  TempSrc:   Oral Oral  SpO2: 95% 93% 90% 93%  Weight:    78.5 kg  Height:       Wt Readings from Last 3 Encounters:  01/13/20 78.5 kg  01/12/20 81.3 kg  12/16/19 79 kg    Labs: Lab Results  Component Value Date   NA 142 01/13/2020   K 4.9 01/13/2020   CL 101 01/13/2020   CO2 33 (H) 01/13/2020   GLUCOSE 101 (H) 01/13/2020   BUN 26 (H) 01/13/2020   CREATININE 1.20 01/13/2020   CALCIUM 8.6 (L) 01/13/2020   MG 2.1 01/13/2020   Lab Results  Component Value Date   INR 1.5 (H) 12/16/2019   Lab Results  Component Value Date   CHOL 128 03/11/2019   HDL 35 (L) 03/11/2019   LDLCALC 74 03/11/2019   TRIG 103 03/11/2019     GEN- The patient is well appearing, alert and oriented x 3 today.   Head- normocephalic, atraumatic Eyes-  Sclera  clear, conjunctiva pink Ears- hearing intact Oropharynx- clear Neck- supple, no JVP Lymph- no cervical lymphadenopathy Lungs- Clear to ausculation bilaterally, normal work of breathing Heart- irregular rate and rhythm, no murmurs, rubs or gallops, PMI not laterally displaced GI- soft, NT, ND, + BS Extremities- no clubbing, cyanosis, or edema MS- no significant deformity or atrophy Skin- no rash or lesion Psych- euthymic mood, full affect Neuro- strength and sensation are intact  EKG-aflutter at 63 bpm, qrs int 124 ms, qtc 435 ms    Assessment and Plan: 1. Atrial  flutter  Successful cardioversion but with ERAF in August  We discussed options from Dr. Jackalyn Lombard note He  preferred Tikosyn after norpace washout now that  elective beds are available He  stopped hctz and norpace over  one week ago  No benadryl use No  hctz use Bmet/mag in range for tikosyn admission  Qt acceptable  covid negative Has had covid vaccines  /booster/flu shot  He plans to get drug thru the [place] He uses lasix prn, has not used recently, feels that he may need a few doses while in the hospital for LLE   2. CHA2DS2VASc score of 5 He is on eliquis 5 mg bid for over 3 weeks without missed does(previoulsy on coumadin)     To 6 E when bed available   Butch Penny C. Carroll, [facility] [address] [place], [place] [address] [phone]       Primary Care Physician: [name], [name], MD Referring Physician:  [name] Cardiologist: Dr. Julieta Bellini OAKLEN THIAM is a 74 y.o. male with a h/o CAD, HOCM, and atrial flutter, prior ablation 2012,  on norpace  for several years. He recently saw [name] for options of restoring SR  as he had returned to atrial flutter. Ablation, change in antiarrythmic( Tikosyn vrs amiodarone) were discussed with the pt as well as the option of staying on Norpace  and trying a cardioversion. He chose cardioversion. He did convert but  unfortunately returned to atrial flutter. [name] did not think he would be an ideal candidate for ablation so antiarrythmic's are  discussed today. He is interested in an [place] admission after Norpace wash out and when elective admissions restriction is lifted  due to the bed situation with Covid-19 . He does not use benadryl. He has prn  HCTZ on his med list but states that he never uses it.   He is interested in changing from  warfarin to [place] and will let his coumadin clinic at [facility] know of his wishes to help him transfer. He gets his drugs thru the [place] so cost will not  be an issue.  Today, he denies symptoms of palpitations, chest pain, shortness of breath, orthopnea, PND, lower extremity edema, dizziness, presyncope, syncope, or neurologic sequela. The patient is tolerating medications without difficulties and is otherwise without complaint today.   Past Medical History:  Diagnosis Date  . Acute lower GI bleeding    related to gastritis / viral infection  . Cataract   . Cirrhosis ([place])   . COPD (chronic obstructive pulmonary disease) ([place])    PFT 05-05-2010, FEV1 .9 (26%) ratio 60  . Coronary artery disease   . Gastritis 06-10-2006  . Hiatal hernia 06-10-2006   EGD  . HOH (hard of hearing)   . Hx of adenomatous colonic polyps 2005, 2013   Colonoscopy  . Hypercholesteremia   . Hypertension   . Hypertr obst cardiomyop    mild subaortic stenosis  . Iron deficiency anemia, unspecified   . LVH (left ventricular hypertrophy)   . Other B-complex deficiencies   . Persistent atrial fibrillation (HCC)    persistent  . Rheumatic fever   . Thrombocytopenia ([place]) 05/2008   Past Surgical History:  Procedure Laterality Date  . ATRIAL FLUTTER ABLATION  06/2010   CTI ablation performed by [name]  . CARDIAC CATHETERIZATION  05/28/2005   Widely patent coronaries and normal LV function.  Marland Kitchen CARDIAC CATHETERIZATION  08/11/2001   80% LAD stenosis successfully stented with a  3.5x61mm Cypher DES postdilated to 3.39mm resulting in reduction of 80% to 0%.  . CARDIAC CATHETERIZATION  01/15/2001   Focal 95% proximal RCA stenosis, stented with a 3x40mm Zeta multilink stent with postdilatation utilizing a 3.5x2mm Quantum balloon with stenosis being reduced to 0%.  . CARDIOVASCULAR STRESS TEST  05/29/2011   No scintigraphic evidence of inducible myocardial ischemia. No ECG changes. EKG negative for ischemia.  Marland Kitchen CARDIOVERSION N/A 10/20/2019   Procedure: CARDIOVERSION;  Surgeon: Sanda Klein, MD;  Location: [facility];  Service: Cardiovascular;  Laterality: N/A;  . CORONARY ANGIOPLASTY     2002-2003  . SHOULDER SURGERY    . TRANSTHORACIC ECHOCARDIOGRAM  05/29/2011   EF >70%, severe concentric LV hypertrophy, severe mitral annular calcification, mild-moderate aortic regurg,     Current Facility-Administered Medications  Medication Dose Route Frequency Provider Last Rate Last Admin  . 0.9 %  sodium chloride infusion  250 mL Intravenous PRN Sherran Needs,  NP      . apixaban (ELIQUIS) tablet 5 mg  5 mg Oral BID Sherran Needs, NP   5 mg at 01/12/20 1751  . dofetilide (TIKOSYN) capsule 500 mcg  500 mcg Oral BID Sherran Needs, NP   500 mcg at 01/12/20 2210  . furosemide (LASIX) injection 40 mg  40 mg Intravenous Once Shirley Friar, PA-C      . losartan (COZAAR) tablet 50 mg  50 mg Oral Daily Sherran Needs, NP      . pantoprazole (PROTONIX) EC tablet 40 mg  40 mg Oral Daily Roderic Palau C, NP      . rosuvastatin (CRESTOR) tablet 10 mg  10 mg Oral QPM Sherran Needs, NP   10 mg at 01/12/20 2112  . sodium chloride flush (NS) 0.9 % injection 3 mL  3 mL Intravenous Q12H Sherran Needs, NP   3 mL at 01/12/20 2113  . sodium chloride flush (NS) 0.9 % injection 3 mL  3 mL Intravenous PRN Sherran Needs, NP      . tamsulosin (FLOMAX) capsule 0.4 mg  0.4 mg Oral QHS Thompson Grayer, MD   0.4 mg at 01/12/20 2111    Allergies  Allergen Reactions  .  Tetracycline Other (See Comments)    Issue with stomach lining - can take with food if needed    Social History   Socioeconomic History  . Marital status: Widowed    Spouse name: Not on file  . Number of children: 5  . Years of education: Not on file  . Highest education level: Not on file  Occupational History  . 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 Smoking status: Former Smoker    Packs/day: 1.00    Years: 10.00    Pack years: 10.00    Types: Cigarettes    Quit date: 02/26/1985    Years since quitting: 34.9  . Smokeless tobacco: Never Used  Vaping Use  . Vaping Use: Never used  Substance and Sexual Activity  . Alcohol use: No  . Drug use: No  . Sexual activity: Not on file  Other Topics Concern  . Not on file  Social History Narrative  . Not on file   Social Determinants of Health   Financial Resource Strain:   . Difficulty of Paying Living Expenses: Not on file  Food Insecurity:   . Worried About Charity fundraiser in the Last Year: Not on file  . Ran Out of Food in the Last Year: Not on file  Transportation Needs:   . Lack of Transportation (Medical): Not on file  . Lack of Transportation (Non-Medical): Not on file  Physical Activity:   . Days of Exercise per Week: Not on file  . Minutes of Exercise per Session: Not on file  Stress:   . Feeling of Stress : Not on file  Social Connections:   . Frequency of Communication with Friends and Family: Not on file  . Frequency of Social Gatherings with Friends and Family: Not on file  . Attends Religious Services: Not on file  . Active Member of Clubs or Organizations: Not on file  . Attends Archivist Meetings: Not on file  . Marital Status: Not on file  Intimate Partner Violence:   . Fear of Current or Ex-Partner: Not on file  . Emotionally Abused: Not on file  . Physically Abused: Not on file  .  Sexually Abused: Not on file    Family History  Problem Relation  Age of Onset  . Heart attack Brother 65       Ventircular rupture  . Emphysema Brother   . Lung cancer Brother   . Heart disease Mother        Enlarged heart  . Stroke Mother   . Hyperlipidemia Father   . CAD Father   . Hypertension Sister   . CAD Paternal Uncle   . Stroke Paternal Uncle   . Cancer Maternal Grandfather        colon  . Colon cancer Maternal Grandfather   . Asthma Brother     ROS- All systems are reviewed and negative except as per the HPI above  Physical Exam: Vitals:   01/12/20 1601 01/12/20 1736 01/12/20 2001 01/13/20 0443  BP: 107/83  (!) 139/47 (!) 96/50  Pulse: 69 85 69 (!) 56  Resp: 20 17 20 20   Temp:   98.3 F (36.8 C) 98.5 F (36.9 C)  TempSrc:   Oral Oral  SpO2: 95% 93% 90% 93%  Weight:    78.5 kg  Height:       Wt Readings from Last 3 Encounters:  01/13/20 78.5 kg  01/12/20 81.3 kg  12/16/19 79 kg    Labs: Lab Results  Component Value Date   NA 142 01/13/2020   K 4.9 01/13/2020   CL 101 01/13/2020   CO2 33 (H) 01/13/2020   GLUCOSE 101 (H) 01/13/2020   BUN 26 (H) 01/13/2020   CREATININE 1.20 01/13/2020   CALCIUM 8.6 (L) 01/13/2020   MG 2.1 01/13/2020   Lab Results  Component Value Date   INR 1.5 (H) 12/16/2019   Lab Results  Component Value Date   CHOL 128 03/11/2019   HDL 35 (L) 03/11/2019   LDLCALC 74 03/11/2019   TRIG 103 03/11/2019     GEN- The patient is well appearing, alert and oriented x 3 today.   Head- normocephalic, atraumatic Eyes-  Sclera clear, conjunctiva pink Ears- hearing intact Oropharynx- clear Neck- supple, no JVP Lymph- no cervical lymphadenopathy Lungs- Clear to ausculation bilaterally, normal work of breathing Heart- irregular rate and rhythm, no murmurs, rubs or gallops, PMI not laterally displaced GI- soft, NT, ND, + BS Extremities- no clubbing, cyanosis, or edema MS- no significant deformity or atrophy Skin- no rash or lesion Psych- euthymic mood, full affect Neuro- strength and  sensation are intact  EKG from AF clinic-aflutter at 60 bpm, qrs int 148 ms, qtc 472 ms    Assessment and Plan: 1. Atrial  flutter  Successful prior cardioversion but is now back in flutter Was not aware but state he felt great the first day after cardioversion  He would prefer Tikosyn after norpace washout  No benadryl use He has hctz prn, he states that he does not use Pre-admission labs   He plans to get drug thru the [place]  2. CHA2DS2VASc score of 5 He was transitioned from coumadin to Eliquis 4 weeks ago and has not missed any doses.   3. Acute on chronic diastolic CHF Echo 8/46/6599 LVEF 60-65% with no LVOT Takes lasix prn at home. Orthopneic x 1 week. Seen with [name]. Will give 1 dose 40 mg IV lasix and 20 meq of K and follow.   Legrand Como [address] [place], PA-C  01/12/2020

## 2020-01-13 NOTE — TOC Benefit Eligibility Note (Signed)
Transition of Care [facility]) Benefit Eligibility Note    Patient Details  Name: Joshua [name] MRN: [id number] Date of Birth: 06/23/1945   Medication/Dose: Phyllis Ginger 125 MCG BID   250 MCG BID  500 MCG BID  Covered?: Yes  Tier:  (TIER- 4 DRUG)  Prescription Coverage [facility]erred [facility]acy: [facility]  and [facility]  Spoke with Person/Company/Phone Number:: [facility]  @  [employer] # [phone]  Co-Pay: $100.00  FOR EACR PRESCRIPTION  Prior Approval: Yes (# [phone])  Deductible: Met  Additional Notes: DOFETILIDE  125 MCG BID  250 MCG BID  500 MCG BID   COVER- YES  CO-PAY- $10.00 FOR EACH  PRESCRIPTION  TIER- 1 DRUG  P/A-NO    Memory Argue Phone Number: 01/13/2020, 2:36 PM

## 2020-01-13 NOTE — Progress Notes (Signed)
Patient converted to sinus rhythm, notified PA Tillary, ECG completed

## 2020-01-13 NOTE — Progress Notes (Signed)
Morning EKG reviewed  Shows pt remains in afib at 60 bpm with stable QTc at ~470 ms.  Continue Tikosyn 500 mcg BID.   Pt will be NPO after midnight for DCCV if remains in [place], [place]  Pager: [phone]  01/13/2020 11:02 AM

## 2020-01-14 ENCOUNTER — Inpatient Hospital Stay (HOSPITAL_COMMUNITY): Payer: Medicare PPO | Admitting: Anesthesiology

## 2020-01-14 ENCOUNTER — Encounter (HOSPITAL_COMMUNITY)
Admission: AD | Disposition: A | Discharge status: Home / Self Care | Payer: Self-pay | Source: Ambulatory Visit | Attending: Internal Medicine

## 2020-01-14 ENCOUNTER — Encounter (HOSPITAL_COMMUNITY): Payer: Self-pay | Admitting: Internal Medicine

## 2020-01-14 DIAGNOSIS — I480 Paroxysmal atrial fibrillation: Secondary | ICD-10-CM | POA: Diagnosis not present

## 2020-01-14 DIAGNOSIS — I5033 Acute on chronic diastolic (congestive) heart failure: Secondary | ICD-10-CM | POA: Diagnosis not present

## 2020-01-14 HISTORY — PX: CARDIOVERSION: SHX1299

## 2020-01-14 LAB — BASIC METABOLIC PANEL
Anion gap: 6 (ref 5–15)
BUN: 26 mg/dL — ABNORMAL HIGH (ref 8–23)
CO2: 39 mmol/L — ABNORMAL HIGH (ref 22–32)
Calcium: 8.6 mg/dL — ABNORMAL LOW (ref 8.9–10.3)
Chloride: 94 mmol/L — ABNORMAL LOW (ref 98–111)
Creatinine, Ser: 1.04 mg/dL (ref 0.61–1.24)
GFR, Estimated: >60 mL/min (ref >60)
Glucose, Bld: 90 mg/dL (ref 70–99)
Potassium: 3.9 mmol/L (ref 3.5–5.1)
Sodium: 139 mmol/L (ref 135–145)

## 2020-01-14 LAB — MAGNESIUM: Magnesium: 2.1 mg/dL (ref 1.7–2.4)

## 2020-01-14 SURGERY — CARDIOVERSION
Anesthesia: General

## 2020-01-14 MED ORDER — SODIUM CHLORIDE 0.9 % IV SOLN: INTRAVENOUS | Status: DC | PRN

## 2020-01-14 MED ORDER — EPHEDRINE SULFATE-NACL 50-0.9 MG/10ML-% IV SOSY
PREFILLED_SYRINGE | INTRAVENOUS | Status: DC | PRN
  Administered 2020-01-14: 10 mg via INTRAVENOUS

## 2020-01-14 MED ORDER — PROPOFOL 10 MG/ML IV BOLUS
INTRAVENOUS | Status: DC | PRN
  Administered 2020-01-14: 60 mg via INTRAVENOUS

## 2020-01-14 MED ORDER — POTASSIUM CHLORIDE CRYS ER 20 MEQ PO TBCR
40.0000 meq | EXTENDED_RELEASE_TABLET | Freq: Once | ORAL | Status: AC
  Administered 2020-01-14: 40 meq via ORAL
  Filled 2020-01-14: qty 2

## 2020-01-14 MED ORDER — POTASSIUM CHLORIDE CRYS ER 20 MEQ PO TBCR
20.0000 meq | EXTENDED_RELEASE_TABLET | Freq: Every day | ORAL | Status: DC
  Administered 2020-01-15: 20 meq via ORAL
  Filled 2020-01-14: qty 1

## 2020-01-14 MED ORDER — FUROSEMIDE 40 MG PO TABS
40.0000 mg | ORAL_TABLET | Freq: Every day | ORAL | Status: DC
  Administered 2020-01-14 – 2020-01-15 (×2): 40 mg via ORAL
  Filled 2020-01-14 (×2): qty 1

## 2020-01-14 MED ORDER — LIDOCAINE HCL (CARDIAC) PF 100 MG/5ML IV SOSY
PREFILLED_SYRINGE | INTRAVENOUS | Status: DC | PRN
  Administered 2020-01-14: 80 mg via INTRATRACHEAL

## 2020-01-14 NOTE — Progress Notes (Signed)
Patient continues to require oxygen. Reviewing chart he carries an old diagnosis of COPD and was previously seen by pulmonary, but not in many years.  He does have a history of smoking, but quit "40 years ago".  Continue daily po lasix.  Diagnoses: Persistent Atrial fibrillation Acute on chronic diastolic heart failure COPD  Acute hypoxic respiratory failure   Hopefully his oxygen requirement will improve a s he is diuresed. We will work in arranging oxygen in the event it does not.   Legrand Como [address] [place], [place]

## 2020-01-14 NOTE — Progress Notes (Signed)
Pharmacy: Dofetilide (Tikosyn) - Follow Up Assessment and Electrolyte Replacement  Pharmacy consulted to assist in monitoring and replacing electrolytes in this 74 y.o. male admitted on 01/12/2020 undergoing dofetilide initiation. First dofetilide dose: 01/12/20.  Labs:    Component Value Date/Time   K 3.9 01/14/2020 0210   MG 2.1 01/14/2020 0210     Plan: Potassium: K 3.8-3.9:  Give KCl 40 mEq po x1   Magnesium: Mg > 2: No additional supplementation needed   Thank you for allowing pharmacy to participate in this patient's care   Erin Hearing PharmD., BCPS Clinical Pharmacist 01/14/2020 1:33 PM

## 2020-01-14 NOTE — Progress Notes (Signed)
SATURATION QUALIFICATIONS: (This note is used to comply with regulatory documentation for home oxygen)  Patient Saturations on Room Air at Rest = 84%   Patient Saturations on Room Air while Ambulating = N/A  Patient Saturations on 3 Liters of oxygen at rest = 93%  Please briefly explain why patient needs home oxygen:

## 2020-01-14 NOTE — Care Management (Addendum)
1355 01-14-20 Case Manager spoke with patient regarding Dofetilide. Patient would like to get his first 30 day supply from [facility] and he will need written Rx to take to the [facility] for a 90 day supply. Following the patient for home oxygen needs. If patient wants to go through the [place] for Oxygen the process may take longer. Bethena Roys, RN,BSN Case Manager [phone]    [phone] 01-14-20 Case Manager spoke with patient regarding oxygen for home. Patient wants to use his VA benefit for 02. [employer] would cost him $36.18 a month for oxygen. Case Manager called the [facility] Respiratory team and spoke with Janett Billow and she has an order from the [place] MD for 02. Case Manager sent the qualifying sats for this patient-hopefully the order can be processed tonight and delivered to the patients home in the morning. Daughter will be at the home for delivery and she will have to bring a portable tank to the hospital. Case Manager will continue to follow for additional transition of care needs. Bethena Roys, RN,BSN Case Manager

## 2020-01-14 NOTE — Anesthesia Postprocedure Evaluation (Signed)
Anesthesia Post Note  Patient: Joshua [name]  Procedure(s) Performed: CARDIOVERSION (N/A )     Patient location during evaluation: Endoscopy Anesthesia Type: General Level of consciousness: awake and alert Pain management: pain level controlled Vital Signs Assessment: post-procedure vital signs reviewed and stable Respiratory status: spontaneous breathing, nonlabored ventilation, respiratory function stable and patient connected to nasal cannula oxygen Cardiovascular status: blood pressure returned to baseline and stable Postop Assessment: no apparent nausea or vomiting Anesthetic complications: no   No complications documented.  Last Vitals:  Vitals:   01/14/20 0922 01/14/20 0929  BP: (!) 98/44 (!) 112/46  Pulse: 80 78  Resp: (!) 31 (!) 30  Temp: 37.2 C   SpO2: 92% 92%    Last Pain:  Vitals:   01/14/20 0929  TempSrc:   PainSc: 0-No pain                 Barnet Glasgow

## 2020-01-14 NOTE — H&P (Signed)
Primary Care Physician: [name], [name], MD Referring Physician: [name] Cardiologist: Dr. Julieta Bellini Joshua [name] is a 74 y.o. male with a h/o CAD, HOCM, and atrial flutter, prior ablation 2012,  on norpace  for several years. He  saw [name] in August  for options of restoring SR  as he had returned to atrial flutter. Ablation, change in antiarrythmic (Tikosyn vrs amiodarone) were discussed with the pt as well as the option of staying on Norpace  and trying a cardioversion. He chose cardioversion. He did convert but unfortunately returned to atrial flutter. [name] did not think he would be an ideal candidate for ablation so antiarrythmic's were discussed. He chose [name].  He is  back in afib clinic, 11/16,  for [place] admission now that covid bed restrictions have been lifted. Has been off norpace  and HCTZ for over one week. He switched to eliquis from coumadin 3-4 weeks ago, no missed anticoagulation. Will be getting [facility]osyn thru the [facility]. Qtc stable. He feels he may need a few dose of lasix in the hospital for LLE.   he denies symptoms of palpitations, chest pain, shortness of breath, orthopnea, PND, lower extremity edema, dizziness, presyncope, syncope, or neurologic sequela. The patient is tolerating medications without difficulties and is otherwise without complaint today.   Past Medical History:  Diagnosis Date  . Acute lower GI bleeding    related to gastritis / viral infection  . Cataract   . Cirrhosis ([place])   . COPD (chronic obstructive pulmonary disease) ([place])    PFT 05-05-2010, FEV1 .9 (26%) ratio 60  . Coronary artery disease   . Gastritis 06-10-2006  . Hiatal hernia 06-10-2006   EGD  . HOH (hard of hearing)   . Hx of adenomatous colonic polyps 2005, 2013   Colonoscopy  . Hypercholesteremia   . Hypertension   . Hypertr obst cardiomyop    mild subaortic stenosis  . Iron deficiency anemia, unspecified   . LVH (left ventricular hypertrophy)   . Other  B-complex deficiencies   . Persistent atrial fibrillation (HCC)    persistent  . Rheumatic fever   . Thrombocytopenia ([place]) 05/2008   Past Surgical History:  Procedure Laterality Date  . ATRIAL FLUTTER ABLATION  06/2010   CTI ablation performed by [name]  . CARDIAC CATHETERIZATION  05/28/2005   Widely patent coronaries and normal LV function.  Marland Kitchen CARDIAC CATHETERIZATION  08/11/2001   80% LAD stenosis successfully stented with a 3.5x14mm Cypher DES postdilated to 3.52mm resulting in reduction of 80% to 0%.  . CARDIAC CATHETERIZATION  01/15/2001   Focal 95% proximal RCA stenosis, stented with a 3x71mm Zeta multilink stent with postdilatation utilizing a 3.5x43mm Quantum balloon with stenosis being reduced to 0%.  . CARDIOVASCULAR STRESS TEST  05/29/2011   No scintigraphic evidence of inducible myocardial ischemia. No ECG changes. EKG negative for ischemia.  Marland Kitchen CARDIOVERSION N/A 10/20/2019   Procedure: CARDIOVERSION;  Surgeon: Sanda Klein, MD;  Location: [place] [facility];  Service: Cardiovascular;  Laterality: N/A;  . CORONARY ANGIOPLASTY     2002-2003  . SHOULDER SURGERY    . TRANSTHORACIC ECHOCARDIOGRAM  05/29/2011   EF >70%, severe concentric LV hypertrophy, severe mitral annular calcification, mild-moderate aortic regurg,     Current Facility-Administered Medications  Medication Dose Route Frequency Provider Last Rate Last Admin  . 0.9 %  sodium chloride infusion  250 mL Intravenous PRN Buford Dresser, MD      . apixaban Arne Cleveland) tablet  5 mg  5 mg Oral BID Buford Dresser, MD   5 mg at 01/14/20 0749  . dofetilide (TIKOSYN) capsule 500 mcg  500 mcg Oral BID Buford Dresser, MD   500 mcg at 01/14/20 0748  . losartan (COZAAR) tablet 50 mg  50 mg Oral Daily Buford Dresser, MD   50 mg at 01/14/20 0749  . pantoprazole (PROTONIX) EC tablet 40 mg  40 mg Oral Daily Buford Dresser, MD   40 mg at 01/14/20 0749  . rosuvastatin (CRESTOR) tablet 10 mg  10 mg Oral  QPM Buford Dresser, MD   10 mg at 01/13/20 1825  . sodium chloride flush (NS) 0.9 % injection 3 mL  3 mL Intravenous Q12H Buford Dresser, MD   3 mL at 01/14/20 1017  . sodium chloride flush (NS) 0.9 % injection 3 mL  3 mL Intravenous PRN Buford Dresser, MD      . tamsulosin [facility]) capsule 0.4 mg  0.4 mg Oral QHS Buford Dresser, MD   0.4 mg at 01/13/20 2038    Allergies  Allergen Reactions  . Tetracycline Other (See Comments)    Issue with stomach lining - can take with food if needed    Social History   Socioeconomic History  . Marital status: Widowed    Spouse name: Not on file  . Number of children: 5  . Years of education: Not on file  . Highest education level: Not on file  Occupational History  . 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 Smoking status: Former Smoker    Packs/day: 1.00    Years: 10.00    Pack years: 10.00    Types: Cigarettes    Quit date: 02/26/1985    Years since quitting: 34.9  . Smokeless tobacco: Never Used  Vaping Use  . Vaping Use: Never used  Substance and Sexual Activity  . Alcohol use: No  . Drug use: No  . Sexual activity: Not on file  Other Topics Concern  . Not on file  Social History Narrative  . Not on file   Social Determinants of Health   Financial Resource Strain:   . Difficulty of Paying Living Expenses: Not on file  Food Insecurity:   . Worried About Charity fundraiser in the Last Year: Not on file  . Ran Out of Food in the Last Year: Not on file  Transportation Needs:   . Lack of Transportation (Medical): Not on file  . Lack of Transportation (Non-Medical): Not on file  Physical Activity:   . Days of Exercise per Week: Not on file  . Minutes of Exercise per Session: Not on file  Stress:   . Feeling of Stress : Not on file  Social Connections:   . Frequency of Communication with Friends and Family: Not on file  . Frequency of Social Gatherings with  Friends and Family: Not on file  . Attends Religious Services: Not on file  . Active Member of Clubs or Organizations: Not on file  . Attends Archivist Meetings: Not on file  . Marital Status: Not on file  Intimate Partner Violence:   . Fear of Current or Ex-Partner: Not on file  . Emotionally Abused: Not on file  . Physically Abused: Not on file  . Sexually Abused: Not on file    Family History  Problem Relation Age of Onset  . Heart attack Brother 39       Ventircular rupture  .  Emphysema Brother   . Lung cancer Brother   . Heart disease Mother        Enlarged heart  . Stroke Mother   . Hyperlipidemia Father   . CAD Father   . Hypertension Sister   . CAD Paternal Uncle   . Stroke Paternal Uncle   . Cancer Maternal Grandfather        colon  . Colon cancer Maternal Grandfather   . Asthma Brother     ROS- All systems are reviewed and negative except as per the HPI above  Physical Exam: Vitals:   01/14/20 0922 01/14/20 0929 01/14/20 0930 01/14/20 0940  BP: (!) 98/44 (!) 112/46 (!) 112/46 (!) 117/38  Pulse: 80 78 78 70  Resp: (!) 31 (!) 30 (!) 28 (!) 21  Temp: 98.9 F (37.2 C)     TempSrc: Axillary     SpO2: 92% 92% 91% 90%  Weight:      Height:       Wt Readings from Last 3 Encounters:  01/14/20 77.7 kg  01/12/20 81.3 kg  12/16/19 79 kg    Labs: Lab Results  Component Value Date   NA 139 01/14/2020   K 3.9 01/14/2020   CL 94 (L) 01/14/2020   CO2 39 (H) 01/14/2020   GLUCOSE 90 01/14/2020   BUN 26 (H) 01/14/2020   CREATININE 1.04 01/14/2020   CALCIUM 8.6 (L) 01/14/2020   MG 2.1 01/14/2020   Lab Results  Component Value Date   INR 1.3 (H) 01/13/2020   Lab Results  Component Value Date   CHOL 128 03/11/2019   HDL 35 (L) 03/11/2019   LDLCALC 74 03/11/2019   TRIG 103 03/11/2019     GEN- The patient is well appearing, alert and oriented x 3 today.   Head- normocephalic, atraumatic Eyes-  Sclera clear, conjunctiva pink Ears- hearing  intact Oropharynx- clear Neck- supple, no JVP Lymph- no cervical lymphadenopathy Lungs- Clear to ausculation bilaterally, normal work of breathing Heart- irregular rate and rhythm, no murmurs, rubs or gallops, PMI not laterally displaced GI- soft, NT, ND, + BS Extremities- no clubbing, cyanosis, or edema MS- no significant deformity or atrophy Skin- no rash or lesion Psych- euthymic mood, full affect Neuro- strength and sensation are intact  EKG-aflutter at 63 bpm, qrs int 124 ms, qtc 435 ms    Assessment and Plan: 1. Atrial  flutter  Successful cardioversion but with ERAF in August  We discussed options from Dr. Jackalyn Lombard note He  preferred Tikosyn after norpace washout now that  elective beds are available He  stopped hctz and norpace over  one week ago  No benadryl use No  hctz use Bmet/mag in range for tikosyn admission  Qt acceptable  covid negative Has had covid vaccines  /booster/flu shot  He plans to get drug thru the [place] He uses lasix prn, has not used recently, feels that he may need a few doses while in the hospital for LLE   2. CHA2DS2VASc score of 5 He is on eliquis 5 mg bid for over 3 weeks without missed does(previoulsy on coumadin)     To 6 E when bed available   Butch Penny C. Carroll, [facility] [address] [place], [place] [address] [phone]       Primary Care Physician: [name], [name], MD Referring Physician: [name] Cardiologist: Dr. Julieta Bellini Joshua [name] is a 74 y.o. male with a h/o CAD, HOCM, and atrial flutter,  prior ablation 2012,  on norpace  for several years. He recently saw [name] for options of restoring SR  as he had returned to atrial flutter. Ablation, change in antiarrythmic( Tikosyn vrs amiodarone) were discussed with the pt as well as the option of staying on Norpace  and trying a cardioversion. He chose cardioversion. He did convert but unfortunately returned to atrial flutter. Dr.  Rayann Heman did not think he would be an ideal candidate for ablation so antiarrythmic's are  discussed today. He is interested in an [place] admission after Norpace wash out and when elective admissions restriction is lifted  due to the bed situation with Covid-19 . He does not use benadryl. He has prn  HCTZ on his med list but states that he never uses it.   He is interested in changing from  warfarin to [place] and will let his coumadin clinic at [facility] know of his wishes to help him transfer. He gets his drugs thru the [place] so cost will not  be an issue.  Today, he denies symptoms of palpitations, chest pain, shortness of breath, orthopnea, PND, lower extremity edema, dizziness, presyncope, syncope, or neurologic sequela. The patient is tolerating medications without difficulties and is otherwise without complaint today.   Past Medical History:  Diagnosis Date  . Acute lower GI bleeding    related to gastritis / viral infection  . Cataract   . Cirrhosis ([place])   . COPD (chronic obstructive pulmonary disease) ([place])    PFT 05-05-2010, FEV1 .9 (26%) ratio 60  . Coronary artery disease   . Gastritis 06-10-2006  . Hiatal hernia 06-10-2006   EGD  . HOH (hard of hearing)   . Hx of adenomatous colonic polyps 2005, 2013   Colonoscopy  . Hypercholesteremia   . Hypertension   . Hypertr obst cardiomyop    mild subaortic stenosis  . Iron deficiency anemia, unspecified   . LVH (left ventricular hypertrophy)   . Other B-complex deficiencies   . Persistent atrial fibrillation (HCC)    persistent  . Rheumatic fever   . Thrombocytopenia ([place]) 05/2008   Past Surgical History:  Procedure Laterality Date  . ATRIAL FLUTTER ABLATION  06/2010   CTI ablation performed by [name]  . CARDIAC CATHETERIZATION  05/28/2005   Widely patent coronaries and normal LV function.  Marland Kitchen CARDIAC CATHETERIZATION  08/11/2001   80% LAD stenosis successfully stented with a 3.5x7mm Cypher DES postdilated to 3.31mm resulting  in reduction of 80% to 0%.  . CARDIAC CATHETERIZATION  01/15/2001   Focal 95% proximal RCA stenosis, stented with a 3x43mm Zeta multilink stent with postdilatation utilizing a 3.5x58mm Quantum balloon with stenosis being reduced to 0%.  . CARDIOVASCULAR STRESS TEST  05/29/2011   No scintigraphic evidence of inducible myocardial ischemia. No ECG changes. EKG negative for ischemia.  Marland Kitchen CARDIOVERSION N/A 10/20/2019   Procedure: CARDIOVERSION;  Surgeon: Sanda Klein, MD;  Location: [place] ENDOSCOPY;  Service: Cardiovascular;  Laterality: N/A;  . CORONARY ANGIOPLASTY     2002-2003  . SHOULDER SURGERY    . TRANSTHORACIC ECHOCARDIOGRAM  05/29/2011   EF >70%, severe concentric LV hypertrophy, severe mitral annular calcification, mild-moderate aortic regurg,     Current Facility-Administered Medications  Medication Dose Route Frequency Provider Last Rate Last Admin  . 0.9 %  sodium chloride infusion  250 mL Intravenous PRN Buford Dresser, MD      . apixaban Arne Cleveland) tablet 5 mg  5 mg Oral BID Buford Dresser, MD   5 mg  at 01/14/20 0749  . dofetilide (TIKOSYN) capsule 500 mcg  500 mcg Oral BID Buford Dresser, MD   500 mcg at 01/14/20 0748  . losartan (COZAAR) tablet 50 mg  50 mg Oral Daily Buford Dresser, MD   50 mg at 01/14/20 0749  . pantoprazole (PROTONIX) EC tablet 40 mg  40 mg Oral Daily Buford Dresser, MD   40 mg at 01/14/20 0749  . rosuvastatin (CRESTOR) tablet 10 mg  10 mg Oral QPM Buford Dresser, MD   10 mg at 01/13/20 1825  . sodium chloride flush (NS) 0.9 % injection 3 mL  3 mL Intravenous Q12H Buford Dresser, MD   3 mL at 01/14/20 1017  . sodium chloride flush (NS) 0.9 % injection 3 mL  3 mL Intravenous PRN Buford Dresser, MD      . tamsulosin [facility]) capsule 0.4 mg  0.4 mg Oral QHS Buford Dresser, MD   0.4 mg at 01/13/20 2038    Allergies  Allergen Reactions  . Tetracycline Other (See Comments)    Issue with stomach  lining - can take with food if needed    Social History   Socioeconomic History  . Marital status: Widowed    Spouse name: Not on file  . Number of children: 5  . Years of education: Not on file  . Highest education level: Not on file  Occupational History  . 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 Smoking status: Former Smoker    Packs/day: 1.00    Years: 10.00    Pack years: 10.00    Types: Cigarettes    Quit date: 02/26/1985    Years since quitting: 34.9  . Smokeless tobacco: Never Used  Vaping Use  . Vaping Use: Never used  Substance and Sexual Activity  . Alcohol use: No  . Drug use: No  . Sexual activity: Not on file  Other Topics Concern  . Not on file  Social History Narrative  . Not on file   Social Determinants of Health   Financial Resource Strain:   . Difficulty of Paying Living Expenses: Not on file  Food Insecurity:   . Worried About Charity fundraiser in the Last Year: Not on file  . Ran Out of Food in the Last Year: Not on file  Transportation Needs:   . Lack of Transportation (Medical): Not on file  . Lack of Transportation (Non-Medical): Not on file  Physical Activity:   . Days of Exercise per Week: Not on file  . Minutes of Exercise per Session: Not on file  Stress:   . Feeling of Stress : Not on file  Social Connections:   . Frequency of Communication with Friends and Family: Not on file  . Frequency of Social Gatherings with Friends and Family: Not on file  . Attends Religious Services: Not on file  . Active Member of Clubs or Organizations: Not on file  . Attends Archivist Meetings: Not on file  . Marital Status: Not on file  Intimate Partner Violence:   . Fear of Current or Ex-Partner: Not on file  . Emotionally Abused: Not on file  . Physically Abused: Not on file  . Sexually Abused: Not on file    Family History  Problem Relation Age of Onset  . Heart attack Brother 55        Ventircular rupture  . Emphysema Brother   . Lung cancer Brother   . Heart  disease Mother        Enlarged heart  . Stroke Mother   . Hyperlipidemia Father   . CAD Father   . Hypertension Sister   . CAD Paternal Uncle   . Stroke Paternal Uncle   . Cancer Maternal Grandfather        colon  . Colon cancer Maternal Grandfather   . Asthma Brother     ROS- All systems are reviewed and negative except as per the HPI above  Physical Exam: Vitals:   01/14/20 0922 01/14/20 0929 01/14/20 0930 01/14/20 0940  BP: (!) 98/44 (!) 112/46 (!) 112/46 (!) 117/38  Pulse: 80 78 78 70  Resp: (!) 31 (!) 30 (!) 28 (!) 21  Temp: 98.9 F (37.2 C)     TempSrc: Axillary     SpO2: 92% 92% 91% 90%  Weight:      Height:       Wt Readings from Last 3 Encounters:  01/14/20 77.7 kg  01/12/20 81.3 kg  12/16/19 79 kg    Labs: Lab Results  Component Value Date   NA 139 01/14/2020   K 3.9 01/14/2020   CL 94 (L) 01/14/2020   CO2 39 (H) 01/14/2020   GLUCOSE 90 01/14/2020   BUN 26 (H) 01/14/2020   CREATININE 1.04 01/14/2020   CALCIUM 8.6 (L) 01/14/2020   MG 2.1 01/14/2020   Lab Results  Component Value Date   INR 1.3 (H) 01/13/2020   Lab Results  Component Value Date   CHOL 128 03/11/2019   HDL 35 (L) 03/11/2019   LDLCALC 74 03/11/2019   TRIG 103 03/11/2019     GEN- The patient is well appearing, alert and oriented x 3 today.   Head- normocephalic, atraumatic Eyes-  Sclera clear, conjunctiva pink Ears- hearing intact Oropharynx- clear Neck- supple, no JVP Lymph- no cervical lymphadenopathy Lungs- Clear to ausculation bilaterally, normal work of breathing Heart- irregular rate and rhythm, no murmurs, rubs or gallops, PMI not laterally displaced GI- soft, NT, ND, + BS Extremities- no clubbing, cyanosis, or edema MS- no significant deformity or atrophy Skin- no rash or lesion Psych- euthymic mood, full affect Neuro- strength and sensation are intact  EKG-aflutter at 60 bpm, qrs int  148 ms, qtc 472 ms    Assessment and Plan: 1. Atrial  flutter  Successful cardioversion but is now back in flutter Was not aware but state he felt great the first day after cardioversion  We discussed options form Dr. Jackalyn Lombard note He would prefer Tikosyn after norpace washout when elective beds are available Will send meds to pharmD for  screening  No benadryl use He has hctz prn, he states that he does not use Last K+/mag in Epic in range  He plans to get drug thru the [place]  2. CHA2DS2VASc score of 5 He wishes to tranfer from  warfarin to [place] I will let [facility] coumadin clinic be aware to help transition   We will be in touch when elective bed admission restriction is lifted for tikosyn admit  Butch Penny C. Carroll, [facility] [address] [place], [place] [address] [phone]    I have seen, examined the patient, and reviewed the above assessment and plan.  Changes to above are made where necessary.  On exam, iRRR.  Bibasilar rales,  + edema with venous stasis changes. We will admit for IV diuresis and initiation of tikosyn. He has persistent afib but reports compliance with anticoagulation.  Thompson Grayer, MD

## 2020-01-14 NOTE — Transfer of Care (Signed)
Immediate Anesthesia Transfer of Care Note  Patient: Joshua [name]  Procedure(s) Performed: CARDIOVERSION (N/A )  Patient Location: Endoscopy Unit  Anesthesia Type:General  Level of Consciousness: drowsy and responds to stimulation  Airway & Oxygen Therapy: Patient Spontanous Breathing and Patient connected to nasal cannula oxygen  Post-op Assessment: Report given to RN and Post -op Vital signs reviewed and stable  Post vital signs: Reviewed and stable  Last Vitals:  Vitals Value Taken Time  BP 127/46   Temp    Pulse 82   Resp 12   SpO2 92     Last Pain:  Vitals:   01/14/20 0834  TempSrc: Oral  PainSc: 0-No pain         Complications: No complications documented.

## 2020-01-14 NOTE — CV Procedure (Signed)
Procedure:   DCCV  Indication:  Symptomatic atrial flutter/atrial fibrillation  Procedure Note:  The patient signed informed consent.  They have had had therapeutic anticoagulation with apixaban greater than 3 weeks.  Anesthesia was administered by Dr. Valma Cava.  Patient received 80 mg IV lidocaine and 60 mg IV propofol.Adequate airway was maintained throughout and vital followed per protocol.  They were cardioverted x 1 with 120J of biphasic synchronized energy.  They converted briefly to junctional rhythm, then sinus bradycardia.  There were no apparent complications.  The patient had normal neuro status and respiratory status post procedure with vitals stable as recorded elsewhere.  His baseline oxygen saturation was 90% on arrival, and this was similar post procedure  Follow up:  They will continue on current medical therapy and follow up with cardiology as scheduled.  Buford Dresser, MD PhD 01/14/2020 9:14 AM

## 2020-01-14 NOTE — Anesthesia Preprocedure Evaluation (Signed)
Anesthesia Evaluation  Patient identified by MRN, date of birth, ID band Patient awake    Reviewed: Allergy & Precautions, NPO status , Patient's Chart, lab work & pertinent test results  Airway Mallampati: II  TM Distance: >3 FB Neck ROM: Full    Dental no notable dental hx. (+) Teeth Intact, Dental Advisory Given   Pulmonary COPD, former smoker,    Pulmonary exam normal breath sounds clear to auscultation       Cardiovascular hypertension, + CAD  Normal cardiovascular exam+ dysrhythmias  Rhythm:Regular Rate:Normal  10/08/19 Echo 1. The severe MAC projects into the LVOT, causing flow turbulence.  However, no significant LVOT gradient noted.. Left ventricular ejection  fraction, by estimation, is 60 to 65%. The left ventricle has normal  function. The left ventricle has no regional  wall motion abnormalities. There is severe concentric left ventricular  hypertrophy. Left ventricular diastolic function could not be evaluated.    Neuro/Psych    GI/Hepatic Neg liver ROS, hiatal hernia,   Endo/Other  negative endocrine ROS  Renal/GU negative Renal ROS     Musculoskeletal   Abdominal   Peds  Hematology   Anesthesia Other Findings   Reproductive/Obstetrics                             Anesthesia Physical Anesthesia Plan  ASA: III  Anesthesia Plan: General   Post-op Pain Management:    Induction:   PONV Risk Score and Plan: Treatment may vary due to age or medical condition  Airway Management Planned: Nasal Cannula and Natural Airway  Additional Equipment: None  Intra-op Plan:   Post-operative Plan:   Informed Consent: I have reviewed the patients History and Physical, chart, labs and discussed the procedure including the risks, benefits and alternatives for the proposed anesthesia with the patient or authorized representative who has indicated his/her understanding and acceptance.      Dental advisory given  Plan Discussed with: Anesthesiologist and CRNA  Anesthesia Plan Comments:         Anesthesia Quick Evaluation

## 2020-01-14 NOTE — Progress Notes (Addendum)
Electrophysiology Rounding Note  Patient Name: Joshua [name] Date of Encounter: 01/14/2020  Primary Cardiologist: Shelva Majestic, MD  Electrophysiologist: None    Subjective   Pt converted to NSR, but this am back in AF vs AFL on Tikosyn 500 mcg BID   QTc from EKG last pm shows stable QTc at ~480 while in sinus  The patient is doing well today. His breathing has much improved. Down 5 lbs. He had lasix prn as outpatient but had not been using.  He feels back to his baseline and asks not to get IV lasix this am as it "keeps him running to the bathroom."   Inpatient Medications    Scheduled Meds:  apixaban  5 mg Oral BID   dofetilide  500 mcg Oral BID   losartan  50 mg Oral Daily   pantoprazole  40 mg Oral Daily   potassium chloride  40 mEq Oral Once   rosuvastatin  10 mg Oral QPM   sodium chloride flush  3 mL Intravenous Q12H   tamsulosin  0.4 mg Oral QHS   Continuous Infusions:  sodium chloride     PRN Meds: sodium chloride, sodium chloride flush   Vital Signs    Vitals:   01/13/20 1346 01/13/20 1612 01/13/20 2052 01/14/20 0500  BP: (!) 113/45 (!) 115/50 (!) 106/44 115/66  Pulse: 69 76 77 80  Resp: 18 19 20 18   Temp: 98.1 F (36.7 C) 98.2 F (36.8 C) 98 F (36.7 C) 98.5 F (36.9 C)  TempSrc: Oral Oral Oral Oral  SpO2: 97% 97% 93% 96%  Weight:    77.7 kg  Height:        Intake/Output Summary (Last 24 hours) at 01/14/2020 0749 Last data filed at 01/13/2020 2044 Gross per 24 hour  Intake 240 ml  Output 900 ml  Net -660 ml   Filed Weights   01/12/20 1435 01/13/20 0443 01/14/20 0500  Weight: 80.2 kg 78.5 kg 77.7 kg    Physical Exam    GEN- The patient is well appearing, alert and oriented x 3 today.   Head- normocephalic, atraumatic Eyes-  Sclera clear, conjunctiva pink Ears- hearing intact Oropharynx- clear Neck- supple Lungs- Clear to ausculation bilaterally, normal work of breathing Heart- Irregularly irregular rate and rhythm, no  murmurs, rubs or gallops GI- soft, NT, ND, + BS Extremities- no clubbing, cyanosis, or edema Skin- no rash or lesion Psych- euthymic mood, full affect Neuro- strength and sensation are intact  Labs    CBC No results for input(s): WBC, NEUTROABS, HGB, HCT, MCV, PLT in the last 72 hours. Basic Metabolic Panel Recent Labs    01/13/20 0402 01/14/20 0210  NA 142 139  K 4.9 3.9  CL 101 94*  CO2 33* 39*  GLUCOSE 101* 90  BUN 26* 26*  CREATININE 1.20 1.04  CALCIUM 8.6* 8.6*  MG 2.1 2.1    Potassium  Date/Time Value Ref Range Status  01/14/2020 02:10 AM 3.9 3.5 - 5.1 mmol/L Final   Magnesium  Date/Time Value Ref Range Status  01/14/2020 02:10 AM 2.1 1.7 - 2.4 mg/dL Final    Comment:    Performed at [facility], [place] [address] [place], [place] [address]    Telemetry    NSR from just before 4 pm and until approx 0400 this am. Now in AF vs AFL rates in 70-90s (personally reviewed)  Radiology    No results found.   Patient Profile     [place]  Joshua [name] is a 74 y.o. male with a past medical history significant for persistent atrial fibrillation.  They were admitted for tikosyn load.   Assessment & Plan    1. Persistent atrial fibrillation / atrial flutter Pt is now back in AF vs AFL on Tikosyn 500 mcg BID  Continue Eliquis K 3.9. Will supp Mg 2.1 CHA2DS2VASC is at least 4  2. Acute on chronic diastolic CHF Down > 5 lbs and feeling better.  Hold lasix this am.  Will discuss sliding scale diuretics and use of as needed lasix at home  3. Acute hypoxic respiratory failure New O2 requirement noted.  Will have patient walk this afternoon s/p DCCV and plan accordingly if O2 is still needed after DCCV and diuresis.    For questions or updates, please contact [facility] Please consult [email or web address] for contact info under Cardiology/STEMI.  Signed, Shirley Friar, PA-C  01/14/2020, 7:49 AM    I have seen, examined the patient, and reviewed the  above assessment and plan.  Changes to above are made where necessary.  On exam, RRR.  The patient had some sinus yesterday but back in afib this am.  Now s/p cardioversion. CHF continues to improve.  May be a good candidate for Alleviate HF study.  We will discuss further with him.  Co Sign: Thompson Grayer, MD 01/14/2020 10:38 AM

## 2020-01-14 NOTE — Progress Notes (Signed)
Notified at this time(0520) that patient had intermittent missed beats from 0330 to 0400.  See saved strips.

## 2020-01-14 NOTE — Interval H&P Note (Signed)
History and Physical Interval Note:  01/14/2020 8:40 AM  Joshua [name]  has presented today for surgery, with the diagnosis of a fib.  The various methods of treatment have been discussed with the patient and family. After consideration of risks, benefits and other options for treatment, the patient has consented to  Procedure(s): CARDIOVERSION (N/A) as a surgical intervention.  The patient's history has been reviewed, patient examined, no change in status, stable for surgery.  I have reviewed the patient's chart and labs.  Questions were answered to the patient's satisfaction.     [name] Harrell Gave

## 2020-01-15 ENCOUNTER — Other Ambulatory Visit (HOSPITAL_COMMUNITY): Payer: Self-pay | Admitting: Student

## 2020-01-15 DIAGNOSIS — I421 Obstructive hypertrophic cardiomyopathy: Secondary | ICD-10-CM | POA: Diagnosis not present

## 2020-01-15 DIAGNOSIS — I4819 Other persistent atrial fibrillation: Secondary | ICD-10-CM | POA: Diagnosis not present

## 2020-01-15 DIAGNOSIS — I5033 Acute on chronic diastolic (congestive) heart failure: Secondary | ICD-10-CM | POA: Diagnosis not present

## 2020-01-15 LAB — BASIC METABOLIC PANEL
Anion gap: 7 (ref 5–15)
BUN: 23 mg/dL (ref 8–23)
CO2: 36 mmol/L — ABNORMAL HIGH (ref 22–32)
Calcium: 8.5 mg/dL — ABNORMAL LOW (ref 8.9–10.3)
Chloride: 95 mmol/L — ABNORMAL LOW (ref 98–111)
Creatinine, Ser: 0.87 mg/dL (ref 0.61–1.24)
GFR, Estimated: >60 mL/min (ref >60)
Glucose, Bld: 110 mg/dL — ABNORMAL HIGH (ref 70–99)
Potassium: 4.5 mmol/L (ref 3.5–5.1)
Sodium: 138 mmol/L (ref 135–145)

## 2020-01-15 LAB — MAGNESIUM: Magnesium: 2 mg/dL (ref 1.7–2.4)

## 2020-01-15 MED ORDER — MAGNESIUM SULFATE 2 GM/50ML IV SOLN
2.0000 g | Freq: Once | INTRAVENOUS | Status: AC
  Administered 2020-01-15: 2 g via INTRAVENOUS
  Filled 2020-01-15: qty 50

## 2020-01-15 MED ORDER — FUROSEMIDE 40 MG PO TABS: 40.0000 mg | ORAL_TABLET | Freq: Every day | ORAL | 6 refills | Status: DC

## 2020-01-15 MED ORDER — POTASSIUM CHLORIDE CRYS ER 20 MEQ PO TBCR: 20.0000 meq | EXTENDED_RELEASE_TABLET | Freq: Every day | ORAL | 0 refills | Status: DC

## 2020-01-15 MED ORDER — DOFETILIDE 500 MCG PO CAPS: 500.0000 ug | ORAL_CAPSULE | Freq: Two times a day (BID) | ORAL | 0 refills | Status: DC

## 2020-01-15 MED ORDER — POTASSIUM CHLORIDE CRYS ER 20 MEQ PO TBCR: 20.0000 meq | EXTENDED_RELEASE_TABLET | Freq: Every day | ORAL | 6 refills | Status: DC

## 2020-01-15 MED ORDER — DOFETILIDE 500 MCG PO CAPS: 500.0000 ug | ORAL_CAPSULE | Freq: Two times a day (BID) | ORAL | 6 refills | Status: DC

## 2020-01-15 MED ORDER — FUROSEMIDE 40 MG PO TABS: 40.0000 mg | ORAL_TABLET | Freq: Every day | ORAL | 0 refills | Status: DC

## 2020-01-15 MED FILL — POTASSIUM CHLORIDE 20meqER: 20 | 30 days supply | Qty: 30 | Fill #0

## 2020-01-15 MED FILL — FUROSEMIDE 40 MG TABLET: 40 | 30 days supply | Qty: 30 | Fill #0

## 2020-01-15 MED FILL — DOFETILIDE 500 MCG CAPS: 500 | 30 days supply | Qty: 60 | Fill #0

## 2020-01-15 NOTE — Discharge Summary (Addendum)
ELECTROPHYSIOLOGY PROCEDURE DISCHARGE SUMMARY    Patient ID: Joshua [name],  MRN: [id number], DOB/AGE: 75-Oct-1947 75 y.o.  Admit date: 01/12/2020 Discharge date: 01/15/2020  Primary Care Physician: [name], [name], MD  Primary Cardiologist: Shelva Majestic, MD  Electrophysiologist: [name]  Primary Discharge Diagnosis:  1.  Persistent atrial fibrillation status post Tikosyn loading this admission  Secondary Discharge Diagnosis:  2. Acute on chronic diastolic CHF 3. COPD with hypoxic respiratory failure  Allergies  Allergen Reactions  . Tetracycline Other (See Comments)    Issue with stomach lining - can take with food if needed    Procedures This Admission:  1.  Tikosyn loading 2.  Direct current cardioversion on Thursday January 14, 2020  by Dr Harrell Gave which successfully restored SR.  There were no early apparent complications.   Brief HPI: Joshua [name] is a 74 y.o. male with a past medical history as noted above.  They were referred to EP in the outpatient setting for treatment options of atrial fibrillation.  Risks, benefits, and alternatives to Tikosyn were reviewed with the patient who wished to proceed.    Hospital Course:  The patient was admitted and Tikosyn was initiated.  Renal function and electrolytes were followed during the hospitalization.  Their QTc remained stable. Pt initially converted to NSR on tikosyn, but reverted back to atrial fibrillation vs atrial flutter. On 11/18 they underwent direct current cardioversion which restored sinus rhythm.  They were monitored until discharge on telemetry which demonstrated NSR. Pt was also noted to have new oxygen requirement and volume overload. He was diuresed with IV lasix and transitioned to po. He continued to require oxygen and this was arranged for home. In the hours prior to discharge, he went back out of rhythm into a coarse AF vs Atrial flutter. He will follow up closely with AF clinic and be  scheduled for repeat [facility] if needed. He was were examined by [name]  who considered him stable for discharge to home.  Follow-up has been arranged with the Atrial Fibrillation clinic in approximately 1 week and with EP APP in 2-3 weeks due to HF.   His initial prescriptions were filled via [facility]. He was given paper prescriptions to take to [facility] for future fills.   Physical Exam: Vitals:   01/14/20 0940 01/14/20 1437 01/14/20 2007 01/15/20 0612  BP: (!) 117/38 (!) 127/51 (!) 125/53 (!) 129/54  Pulse: 70 80 70 69  Resp: (!) 21 20 (!) 24 (!) 24  Temp:  97.9 F (36.6 C) 98.2 F (36.8 C) 98.5 F (36.9 C)  TempSrc:  Oral Oral Oral  SpO2: 90% 90% 98% 95%  Weight:    77.7 kg  Height:        GEN- The patient is well appearing, alert and oriented x 3 today.   HEENT: normocephalic, atraumatic; sclera clear, conjunctiva pink; hearing intact; oropharynx clear; neck supple, no JVP Lymph- no cervical lymphadenopathy Lungs- Clear to ausculation bilaterally, normal work of breathing.  No wheezes, rales, rhonchi Heart- Irregular rate and rhythm, no murmurs, rubs or gallops, PMI not laterally displaced GI- soft, non-tender, non-distended, bowel sounds present, no hepatosplenomegaly Extremities- no clubbing, cyanosis, or edema; DP/PT/radial pulses 2+ bilaterally MS- no significant deformity or atrophy Skin- warm and dry, no rash or lesion Psych- euthymic mood, full affect Neuro- strength and sensation are intact   Labs:   Lab Results  Component Value Date   WBC 5.6 06/12/2019   HGB 18.4 (H)  10/20/2019   HCT 54.0 (H) 10/20/2019   MCV 95 06/12/2019   PLT 127 (L) 06/12/2019    Recent Labs  Lab 01/15/20 0230  NA 138  K 4.5  CL 95*  CO2 36*  BUN 23  CREATININE 0.87  CALCIUM 8.5*  GLUCOSE 110*     Discharge Medications:  Allergies as of 01/15/2020      Reactions   Tetracycline Other (See Comments)   Issue with stomach lining - can take with food if needed      Medication  List    TAKE these medications   apixaban 5 MG Tabs tablet Commonly known as: ELIQUIS Take 1 tablet (5 mg total) by mouth 2 (two) times daily.   cholecalciferol 1000 units tablet Commonly known as: VITAMIN D Take 1,000 Units by mouth daily.   cyanocobalamin 1000 MCG tablet Commonly known as: [facility] VITAMIN B12 Take 1 tablet (1,000 mcg total) by mouth daily. What changed: when to take this   dofetilide 500 MCG capsule Commonly known as: TIKOSYN Take 1 capsule (500 mcg total) by mouth 2 (two) times daily.   dofetilide 500 MCG capsule Commonly known as: Tikosyn Take 1 capsule (500 mcg total) by mouth 2 (two) times daily. Start taking on: February 14, 2020   furosemide 40 MG tablet Commonly known as: LASIX Take 1 tablet (40 mg total) by mouth daily. Start taking on: January 16, 2020 What changed:   medication strength  how much to take  when to take this  reasons to take this   furosemide 40 MG tablet Commonly known as: Lasix Take 1 tablet (40 mg total) by mouth daily. Start taking on: February 14, 2020 What changed: You were already taking a medication with the same name, and this prescription was added. Make sure you understand how and when to take each.   losartan 50 MG tablet Commonly known as: COZAAR Take 50 mg by mouth daily.   omeprazole 10 MG capsule Commonly known as: PRILOSEC Take 1 capsule (10 mg total) by mouth daily.   polyethylene glycol 17 g packet Commonly known as: MIRALAX / GLYCOLAX Take 17 g by mouth as needed for mild constipation.   potassium chloride SA 20 MEQ tablet Commonly known as: KLOR-CON Take 1 tablet (20 mEq total) by mouth daily. Start taking on: January 16, 2020   potassium chloride SA 20 MEQ tablet Commonly known as: KLOR-CON Take 1 tablet (20 mEq total) by mouth daily. Start taking on: February 14, 2020   rosuvastatin 10 MG tablet Commonly known as: CRESTOR Take 1 tablet (10 mg total) by mouth daily. What changed: when  to take this   tamsulosin 0.4 MG Caps capsule Commonly known as: FLOMAX Take 1 capsule (0.4 mg total) by mouth daily. What changed: when to take this       Disposition:    Follow-up Information    [facility] Follow up on 01/29/2020.   Why: at 1 pm for post hospital follow up Contact information: [place] [id number] [facility] Follow up on 01/20/2020.   Specialty: Cardiology Why: at 1030 am for post tikosyn follow up Contact information: [address] [id number] [place] [address] [phone]              Duration of Discharge Encounter: Greater than 30 minutes including physician time.  Jacalyn Lefevre, PA-C  01/15/2020  11:30 AM  Thompson Grayer MD, [place] 01/15/2020 12:56 PM

## 2020-01-15 NOTE — Progress Notes (Signed)
°   01/15/20 1405  Assess: MEWS Score  BP (!) 100/50  Assess: MEWS Score  MEWS Temp 0  MEWS Systolic 1  MEWS Pulse 0  MEWS RR 1  MEWS LOC 0  MEWS Score 2  MEWS Score Color Yellow  Assess: if the MEWS score is Yellow or Red  Were vital signs taken at a resting state? Yes  Focused Assessment No change from prior assessment  Early Detection of Sepsis Score *See Row Information* Low  MEWS guidelines implemented *See Row Information* Yes  Treat  MEWS Interventions Escalated (See documentation below)  Pain Scale 0-10  Pain Score 0  Take Vital Signs  Increase Vital Sign Frequency  Yellow: Q 2hr X 2 then Q 4hr X 2, if remains yellow, continue Q 4hrs  Escalate  MEWS: Escalate Yellow: discuss with charge nurse/RN and consider discussing with provider and RRT  Notify: Charge Nurse/RN  Name of Charge Nurse/RN Notified Chrissy, RN  Date Charge Nurse/RN Notified 01/15/20  Time Charge Nurse/RN Notified 1416  Notify: Provider  Provider Name/Title Oda Kilts, PA-C  Date Provider Notified 01/15/20  Time Provider Notified 1345  Notification Type Call  Notification Reason Other (Comment) (yellow mews, low BP)  Response No new orders  Document  Patient Outcome Other (Comment) (stable in department)  Progress note created (see row info) Yes

## 2020-01-15 NOTE — Care Management Important Message (Signed)
Important Message  Patient Details  Name: Joshua [name] MRN: [id number] Date of Birth: 10/09/1945   Medicare Important Message Given:  Yes     Orbie Pyo 01/15/2020, 4:04 PM

## 2020-01-15 NOTE — Progress Notes (Signed)
Pharmacy: Dofetilide (Tikosyn) - Follow Up Assessment and Electrolyte Replacement  Pharmacy consulted to assist in monitoring and replacing electrolytes in this 74 y.o. male admitted on 01/12/2020 undergoing dofetilide initiation. First dofetilide dose: 01/12/20.  Labs:    Component Value Date/Time   K 4.5 01/15/2020 0230   MG 2.0 01/15/2020 0230     Plan: Potassium: K >/= 4: No additional supplementation needed  Magnesium: Mg 1.8-2: Give Mg 2 gm IV x1    Thank you for allowing pharmacy to participate in this patient's care   Erin Hearing PharmD., BCPS Clinical Pharmacist 01/15/2020 10:35 AM

## 2020-01-15 NOTE — Progress Notes (Addendum)
Evening EKG reviewed  Shows remains in NSR at 70 bpm with stable QTc at ~470 ms.  Continue Tikosyn 500 mcg BID.   Excluded from Alleviate-HF due to HOCM.   Pt has continued to require O2 despite diuresis. Previous chest x rays have shown a chronic component of "bibasal pleuroparenchymal thickening consistent with scarring". [facility] to his [facility] as early as this am.  Will follow up with CM/SW.   Possibly home this afternoon pending morning QTc and O2 arrangement. With ? H/o COPD would likely benefit from follow up with pulmonary to update PFTs. Will arrange as outpatient.   Full note pending disposition.   Shirley Friar, PA-C  Pager: [phone]  01/15/2020 7:11 AM

## 2020-01-15 NOTE — Care Management (Signed)
01-15-20 1021 Case Manager spoke with [facility] Lia[facility] Lysle Morales and Oxygen has been approved. ETA to the patient's home is around 1:00 pm. Daughter will then transport a portable tank to the hospital. No further needs from Case Manager at this time. Bethena Roys, RN,BSN Case Manager

## 2020-01-15 NOTE — Progress Notes (Signed)
Patient discharged to private vehicle with home oxygen tank with daughter, Magdalene Molly.

## 2020-01-15 NOTE — Discharge Instructions (Signed)
Dofetilide capsules What is this medicine? DOFETILIDE (doe FET il ide) is an antiarrhythmic drug. It helps make your heart beat regularly. This medicine also helps to slow rapid heartbeats. This medicine may be used for other purposes; ask your health care provider or pharmacist if you have questions. COMMON BRAND NAME(S): Tikosyn What should I tell my health care provider before I take this medicine? They need to know if you have any of these conditions:  heart disease  history of irregular heartbeat  history of low levels of potassium or magnesium in the blood  kidney disease  liver disease  an unusual or allergic reaction to dofetilide, other medicines, foods, dyes, or preservatives  pregnant or trying to get pregnant  breast-feeding How should I use this medicine? Take this medicine by mouth with a glass of water. Follow the directions on the prescription label. Do not take with grapefruit juice. You can take it with or without food. If it upsets your stomach, take it with food. Take your medicine at regular intervals. Do not take it more often than directed. Do not stop taking except on your doctor's advice. A special MedGuide will be given to you by the pharmacist with each prescription and refill. Be sure to read this information carefully each time. Talk to your pediatrician regarding the use of this medicine in children. Special care may be needed. Overdosage: If you think you have taken too much of this medicine contact a poison control center or emergency room at once. NOTE: This medicine is only for you. Do not share this medicine with others. What if I miss a dose? If you miss a dose, skip it. Take your next dose at the normal time. Do not take extra or 2 doses at the same time to make up for the missed dose. What may interact with this medicine? Do not take this medicine with any of the following  medications:  cimetidine  cisapride  dolutegravir  dronedarone  hydrochlorothiazide  ketoconazole  megestrol  pimozide  prochlorperazine  thioridazine  trimethoprim  verapamil This medicine may also interact with the following medications:  amiloride  cannabinoids  certain antibiotics like erythromycin or clarithromycin  certain antiviral medicines for HIV or hepatitis  certain medicines for depression, anxiety, or psychotic disorders  digoxin  diltiazem  grapefruit juice  metformin  nefazodone  other medicines that prolong the QT interval (an abnormal heart rhythm)  quinine  triamterene  zafirlukast  ziprasidone This list may not describe all possible interactions. Give your health care provider a list of all the medicines, herbs, non-prescription drugs, or dietary supplements you use. Also tell them if you smoke, drink alcohol, or use illegal drugs. Some items may interact with your medicine. What should I watch for while using this medicine? Your condition will be monitored carefully while you are receiving this medicine. What side effects may I notice from receiving this medicine? Side effects that you should report to your doctor or health care professional as soon as possible:  allergic reactions like skin rash, itching or hives, swelling of the face, lips, or tongue  breathing problems  chest pain or chest tightness  dizziness  signs and symptoms of a dangerous change in heartbeat or heart rhythm like chest pain; dizziness; fast or irregular heartbeat; palpitations; feeling faint or lightheaded, falls; breathing problems  signs and symptoms of electrolyte imbalance like severe diarrhea, unusual sweating, vomiting, loss of appetite, increased thirst  swelling of the ankles, legs, or feet  tingling,   numbness in the hands or feet Side effects that usually do not require medical attention (report to your doctor or health care  professional if they continue or are bothersome):  diarrhea  general ill feeling or flu-like symptoms  headache  nausea  trouble sleeping  stomach pain This list may not describe all possible side effects. Call your doctor for medical advice about side effects. You may report side effects to FDA at 1-800-FDA-1088. Where should I keep my medicine? Keep out of the reach of children. Store at room temperature between 15 and 30 degrees C (59 and 86 degrees F). Throw away any unused medicine after the expiration date. NOTE: This sheet is a summary. It may not cover all possible information. If you have questions about this medicine, talk to your doctor, pharmacist, or health care provider.  2020 Elsevier/Gold Standard (2018-02-03 10:18:48)   Atrial Fibrillation  Atrial fibrillation is a type of heartbeat that is irregular or fast. If you have this condition, your heart beats without any order. This makes it hard for your heart to pump blood in a normal way. Atrial fibrillation may come and go, or it may become a long-lasting problem. If this condition is not treated, it can put you at higher risk for stroke, heart failure, and other heart problems. What are the causes? This condition may be caused by diseases that damage the heart. They include:  High blood pressure.  Heart failure.  Heart valve disease.  Heart surgery. Other causes include:  Diabetes.  Thyroid disease.  Being overweight.  Kidney disease. Sometimes the cause is not known. What increases the risk? You are more likely to develop this condition if:  You are older.  You smoke.  You exercise often and very hard.  You have a family history of this condition.  You are a man.  You use drugs.  You drink a lot of alcohol.  You have lung conditions, such as emphysema, pneumonia, or COPD.  You have sleep apnea. What are the signs or symptoms? Common symptoms of this condition include:  A feeling  that your heart is beating very fast.  Chest pain or discomfort.  Feeling short of breath.  Suddenly feeling light-headed or weak.  Getting tired easily during activity.  Fainting.  Sweating. In some cases, there are no symptoms. How is this treated? Treatment for this condition depends on underlying conditions and how you feel when you have atrial fibrillation. They include:  Medicines to: ? Prevent blood clots. ? Treat heart rate or heart rhythm problems.  Using devices, such as a pacemaker, to correct heart rhythm problems.  Doing surgery to remove the part of the heart that sends bad signals.  Closing an area where clots can form in the heart (left atrial appendage). In some cases, your doctor will treat other underlying conditions. Follow these instructions at home: Medicines  Take over-the-counter and prescription medicines only as told by your doctor.  Do not take any new medicines without first talking to your doctor.  If you are taking blood thinners: ? Talk with your doctor before you take any medicines that have aspirin or NSAIDs, such as ibuprofen, in them. ? Take your medicine exactly as told by your doctor. Take it at the same time each day. ? Avoid activities that could hurt or bruise you. Follow instructions about how to prevent falls. ? Wear a bracelet that says you are taking blood thinners. Or, carry a card that lists what medicines you take.   Lifestyle      Do not use any products that have nicotine or tobacco in them. These include cigarettes, e-cigarettes, and chewing tobacco. If you need help quitting, ask your doctor.  Eat heart-healthy foods. Talk with your doctor about the right eating plan for you.  Exercise regularly as told by your doctor.  Do not drink alcohol.  Lose weight if you are overweight.  Do not use drugs, including cannabis. General instructions  If you have a condition that causes breathing to stop for a short period of  time (apnea), treat it as told by your doctor.  Keep a healthy weight. Do not use diet pills unless your doctor says they are safe for you. Diet pills may make heart problems worse.  Keep all follow-up visits as told by your doctor. This is important. Contact a doctor if:  You notice a change in the speed, rhythm, or strength of your heartbeat.  You are taking a blood-thinning medicine and you get more bruising.  You get tired more easily when you move or exercise.  You have a sudden change in weight. Get help right away if:   You have pain in your chest or your belly (abdomen).  You have trouble breathing.  You have side effects of blood thinners, such as blood in your vomit, poop (stool), or pee (urine), or bleeding that cannot stop.  You have any signs of a stroke. "BE FAST" is an easy way to remember the main warning signs: ? B - Balance. Signs are dizziness, sudden trouble walking, or loss of balance. ? E - Eyes. Signs are trouble seeing or a change in how you see. ? F - Face. Signs are sudden weakness or loss of feeling in the face, or the face or eyelid drooping on one side. ? A - Arms. Signs are weakness or loss of feeling in an arm. This happens suddenly and usually on one side of the body. ? S - Speech. Signs are sudden trouble speaking, slurred speech, or trouble understanding what people say. ? T - Time. Time to call emergency services. Write down what time symptoms started.  You have other signs of a stroke, such as: ? A sudden, very bad headache with no known cause. ? Feeling like you may vomit (nausea). ? Vomiting. ? A seizure. These symptoms may be an emergency. Do not wait to see if the symptoms will go away. Get medical help right away. Call your local emergency services (911 in the [place] Do not drive yourself to the hospital. Summary  Atrial fibrillation is a type of heartbeat that is irregular or fast.  You are at higher risk of this condition if you  smoke, are older, have diabetes, or are overweight.  Follow your doctor's instructions about medicines, diet, exercise, and follow-up visits.  Get help right away if you have signs or symptoms of a stroke.  Get help right away if you cannot catch your breath, or you have chest pain or discomfort. This information is not intended to replace advice given to you by your health care provider. Make sure you discuss any questions you have with your health care provider. Document Revised: 08/06/2018 Document Reviewed: 08/06/2018 Elsevier Patient Education  2020 [employer]  

## 2020-01-20 ENCOUNTER — Other Ambulatory Visit (HOSPITAL_COMMUNITY): Payer: Self-pay | Admitting: *Deleted

## 2020-01-20 ENCOUNTER — Encounter (HOSPITAL_COMMUNITY): Payer: Self-pay | Admitting: Nurse Practitioner

## 2020-01-20 ENCOUNTER — Ambulatory Visit (HOSPITAL_COMMUNITY)
Admission: RE | Admit: 2020-01-20 | Discharge: 2020-01-20 | Disposition: A | Discharge status: Home / Self Care | Payer: Medicare PPO | Source: Ambulatory Visit | Attending: Physician Assistant | Admitting: Physician Assistant

## 2020-01-20 ENCOUNTER — Other Ambulatory Visit: Payer: Self-pay

## 2020-01-20 VITALS — BP 126/46 | HR 58 | Ht 68.5 in | Wt 178.0 lb

## 2020-01-20 DIAGNOSIS — I4819 Other persistent atrial fibrillation: Secondary | ICD-10-CM

## 2020-01-20 DIAGNOSIS — D6869 Other thrombophilia: Secondary | ICD-10-CM

## 2020-01-20 DIAGNOSIS — Z7901 Long term (current) use of anticoagulants: Secondary | ICD-10-CM | POA: Insufficient documentation

## 2020-01-20 DIAGNOSIS — I4891 Unspecified atrial fibrillation: Secondary | ICD-10-CM | POA: Diagnosis not present

## 2020-01-20 DIAGNOSIS — I4892 Unspecified atrial flutter: Secondary | ICD-10-CM | POA: Insufficient documentation

## 2020-01-20 DIAGNOSIS — Z87891 Personal history of nicotine dependence: Secondary | ICD-10-CM | POA: Insufficient documentation

## 2020-01-20 LAB — BASIC METABOLIC PANEL
Anion gap: 5 (ref 5–15)
BUN: 29 mg/dL — ABNORMAL HIGH (ref 8–23)
CO2: 43 mmol/L — ABNORMAL HIGH (ref 22–32)
Calcium: 9.1 mg/dL (ref 8.9–10.3)
Chloride: 93 mmol/L — ABNORMAL LOW (ref 98–111)
Creatinine, Ser: 0.86 mg/dL (ref 0.61–1.24)
GFR, Estimated: >60 mL/min (ref >60)
Glucose, Bld: 86 mg/dL (ref 70–99)
Potassium: 5.5 mmol/L — ABNORMAL HIGH (ref 3.5–5.1)
Sodium: 141 mmol/L (ref 135–145)

## 2020-01-20 LAB — MAGNESIUM: Magnesium: 2.2 mg/dL (ref 1.7–2.4)

## 2020-01-20 NOTE — Progress Notes (Signed)
Primary Care Physician: [name], [name], MD Referring Physician: [name] Cardiologist: Dr. Julieta Bellini Joshua [name] is a 74 y.o. male with a h/o CAD, HOCM, and atrial flutter, prior ablation 2012,  on norpace  for several years. He  saw [name] in August  for options of restoring SR  as he had returned to atrial flutter. Ablation, change in antiarrythmic (Tikosyn vrs amiodarone) were discussed with the pt as well as the option of staying on Norpace  and trying a cardioversion. He chose cardioversion. He did convert but unfortunately returned to atrial flutter. [name] did not think he would be an ideal candidate for ablation so antiarrythmic's were discussed. He chose [name].  He is  back in afib clinic, 11/16,  for [place] admission now that covid bed restrictions have been lifted. Has been off norpace  and HCTZ for over one week. He switched to eliquis from coumadin 3-4 weeks ago, no missed anticoagulation. Will be getting tikosyn thru the [facility]. Qtc stable. He feels he may need a few dose of lasix in the hospital for LLE.   F/u in afib clinic, 11/23. He is here one week f/u hospitalization for Tikosyn load. He is in SR  but does not feel any different. He is taking Tikosyn at 7:30 am and 7:30 pm.   he denies symptoms of palpitations, chest pain, shortness of breath, orthopnea, PND, lower extremity edema, dizziness, presyncope, syncope, or neurologic sequela. The patient is tolerating medications without difficulties and is otherwise without complaint today.   Past Medical History:  Diagnosis Date  . Acute lower GI bleeding    related to gastritis / viral infection  . Cataract   . Cirrhosis ([place])   . COPD (chronic obstructive pulmonary disease) ([place])    PFT 05-05-2010, FEV1 .9 (26%) ratio 60  . Coronary artery disease   . Gastritis 06-10-2006  . Hiatal hernia 06-10-2006   EGD  . HOH (hard of hearing)   . Hx of adenomatous colonic polyps 2005, 2013   Colonoscopy  .  Hypercholesteremia   . Hypertension   . Hypertr obst cardiomyop    mild subaortic stenosis  . Iron deficiency anemia, unspecified   . LVH (left ventricular hypertrophy)   . Other B-complex deficiencies   . Persistent atrial fibrillation (HCC)    persistent  . Rheumatic fever   . Thrombocytopenia ([place]) 05/2008   Past Surgical History:  Procedure Laterality Date  . ATRIAL FLUTTER ABLATION  06/2010   CTI ablation performed by [name]  . CARDIAC CATHETERIZATION  05/28/2005   Widely patent coronaries and normal LV function.  Marland Kitchen CARDIAC CATHETERIZATION  08/11/2001   80% LAD stenosis successfully stented with a 3.5x85mm Cypher DES postdilated to 3.85mm resulting in reduction of 80% to 0%.  . CARDIAC CATHETERIZATION  01/15/2001   Focal 95% proximal RCA stenosis, stented with a 3x52mm Zeta multilink stent with postdilatation utilizing a 3.5x48mm Quantum balloon with stenosis being reduced to 0%.  . CARDIOVASCULAR STRESS TEST  05/29/2011   No scintigraphic evidence of inducible myocardial ischemia. No ECG changes. EKG negative for ischemia.  Marland Kitchen CARDIOVERSION N/A 10/20/2019   Procedure: CARDIOVERSION;  Surgeon: Sanda Klein, MD;  Location: [facility] ENDOSCOPY;  Service: Cardiovascular;  Laterality: N/A;  . CARDIOVERSION N/A 01/14/2020   Procedure: CARDIOVERSION;  Surgeon: Buford Dresser, MD;  Location: [facility] ENDOSCOPY;  Service: Cardiovascular;  Laterality: N/A;  . CORONARY ANGIOPLASTY     2002-2003  . SHOULDER SURGERY    .  TRANSTHORACIC ECHOCARDIOGRAM  05/29/2011   EF >70%, severe concentric LV hypertrophy, severe mitral annular calcification, mild-moderate aortic regurg,     Current Outpatient Medications  Medication Sig Dispense Refill  . apixaban (ELIQUIS) 5 MG TABS tablet Take 1 tablet (5 mg total) by mouth 2 (two) times daily. 180 tablet 1  . cholecalciferol (VITAMIN D) 1000 units tablet Take 1,000 Units by mouth daily.    . cyanocobalamin (CVS VITAMIN B12) 1000 MCG tablet Take 1 tablet  (1,000 mcg total) by mouth daily. (Patient taking differently: Take 1,000 mcg by mouth every evening. )    . [START ON 02/14/2020] dofetilide (TIKOSYN) 500 MCG capsule Take 1 capsule (500 mcg total) by mouth 2 (two) times daily. 60 capsule 6  . [START ON 02/14/2020] furosemide (LASIX) 40 MG tablet Take 1 tablet (40 mg total) by mouth daily. 30 tablet 6  . losartan (COZAAR) 50 MG tablet Take 50 mg by mouth daily.    Marland Kitchen omeprazole (PRILOSEC) 10 MG capsule Take 1 capsule (10 mg total) by mouth daily. 90 capsule 3  . polyethylene glycol (MIRALAX / GLYCOLAX) packet Take 17 g by mouth as needed for mild constipation.     Derrill Memo ON 02/14/2020] potassium chloride SA (KLOR-CON) 20 MEQ tablet Take 1 tablet (20 mEq total) by mouth daily. 30 tablet 6  . rosuvastatin (CRESTOR) 10 MG tablet Take 1 tablet (10 mg total) by mouth daily. (Patient taking differently: Take 10 mg by mouth every evening. ) 90 tablet 3  . tamsulosin (FLOMAX) 0.4 MG CAPS capsule Take 1 capsule (0.4 mg total) by mouth daily. (Patient taking differently: Take 0.4 mg by mouth at bedtime. ) 90 capsule 3   No current facility-administered medications for this encounter.    Allergies  Allergen Reactions  . Tetracycline Other (See Comments)    Issue with stomach lining - can take with food if needed    Social History   Socioeconomic History  . Marital status: Widowed    Spouse name: Not on file  . Number of children: 5  . Years of education: Not on file  . Highest education level: Not on file  Occupational History  . 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 Smoking status: Former Smoker    Packs/day: 1.00    Years: 10.00    Pack years: 10.00    Types: Cigarettes    Quit date: 02/26/1985    Years since quitting: 34.9  . Smokeless tobacco: Never Used  Vaping Use  . Vaping Use: Never used  Substance and Sexual Activity  . Alcohol use: No  . Drug use: No  . Sexual activity: Not on file  Other  Topics Concern  . Not on file  Social History Narrative  . Not on file   Social Determinants of Health   Financial Resource Strain:   . Difficulty of Paying Living Expenses: Not on file  Food Insecurity:   . Worried About Charity fundraiser in the Last Year: Not on file  . Ran Out of Food in the Last Year: Not on file  Transportation Needs:   . Lack of Transportation (Medical): Not on file  . Lack of Transportation (Non-Medical): Not on file  Physical Activity:   . Days of Exercise per Week: Not on file  . Minutes of Exercise per Session: Not on file  Stress:   . Feeling of Stress : Not on file  Social Connections:   .  Frequency of Communication with Friends and Family: Not on file  . Frequency of Social Gatherings with Friends and Family: Not on file  . Attends Religious Services: Not on file  . Active Member of Clubs or Organizations: Not on file  . Attends Archivist Meetings: Not on file  . Marital Status: Not on file  Intimate Partner Violence:   . Fear of Current or Ex-Partner: Not on file  . Emotionally Abused: Not on file  . Physically Abused: Not on file  . Sexually Abused: Not on file    Family History  Problem Relation Age of Onset  . Heart attack Brother 58       Ventircular rupture  . Emphysema Brother   . Lung cancer Brother   . Heart disease Mother        Enlarged heart  . Stroke Mother   . Hyperlipidemia Father   . CAD Father   . Hypertension Sister   . CAD Paternal Uncle   . Stroke Paternal Uncle   . Cancer Maternal Grandfather        colon  . Colon cancer Maternal Grandfather   . Asthma Brother     ROS- All systems are reviewed and negative except as per the HPI above  Physical Exam: Vitals:   01/20/20 1042  BP: (!) 126/46  Pulse: (!) 58  Weight: 80.7 kg  Height: 5' 8.5" (1.74 m)   Wt Readings from Last 3 Encounters:  01/20/20 80.7 kg  01/15/20 77.7 kg  01/12/20 81.3 kg    Labs: Lab Results  Component Value Date    NA 138 01/15/2020   K 4.5 01/15/2020   CL 95 (L) 01/15/2020   CO2 36 (H) 01/15/2020   GLUCOSE 110 (H) 01/15/2020   BUN 23 01/15/2020   CREATININE 0.87 01/15/2020   CALCIUM 8.5 (L) 01/15/2020   MG 2.0 01/15/2020   Lab Results  Component Value Date   INR 1.3 (H) 01/13/2020   Lab Results  Component Value Date   CHOL 128 03/11/2019   HDL 35 (L) 03/11/2019   LDLCALC 74 03/11/2019   TRIG 103 03/11/2019     GEN- The patient is well appearing, alert and oriented x 3 today.   Head- normocephalic, atraumatic Eyes-  Sclera clear, conjunctiva pink Ears- hearing intact Oropharynx- clear Neck- supple, no JVP Lymph- no cervical lymphadenopathy Lungs- Clear to ausculation bilaterally, normal work of breathing Heart- regular rate and rhythm, no murmurs, rubs or gallops, PMI not laterally displaced GI- soft, NT, ND, + BS Extremities- no clubbing, cyanosis, or edema MS- no significant deformity or atrophy Skin- no rash or lesion Psych- euthymic mood, full affect Neuro- strength and sensation are intact  EKG- sinus brady at 58 bpm, pr int 216 ms, qrs int 122 ms, qtc 459 ms    Assessment and Plan: 1. Atrial  flutter  Successful cardioversion but with ERAF in August  We discussed options from Dr. Jackalyn Lombard note He  preferred Tikosyn after norpace washout now that  elective beds are available Continue dofetilide 500 mcg bid  He stopped hctz and norpace  No benadryl use No  hctz use Bmet/mag today  Qt acceptable  He plans to get drug thru the [place]    2. CHA2DS2VASc score of 5 Continue  eliquis 5 mg bid    F/u with Jonni Sanger 01/29/20  afib clinic as needed    Butch Penny C. [facility] [address] [place], [place] [address]  [place]       Primary Care Physician: [name], [name], MD Referring Physician: [name] Cardiologist: Dr. Julieta Bellini Joshua [name] is a 74 y.o. male with a h/o CAD, HOCM, and atrial flutter, prior ablation 2012,   on norpace  for several years. He recently saw [name] for options of restoring SR  as he had returned to atrial flutter. Ablation, change in antiarrythmic( Tikosyn vrs amiodarone) were discussed with the pt as well as the option of staying on Norpace  and trying a cardioversion. He chose cardioversion. He did convert but unfortunately returned to atrial flutter. [name] did not think he would be an ideal candidate for ablation so antiarrythmic's are  discussed today. He is interested in an [place] admission after Norpace wash out and when elective admissions restriction is lifted  due to the bed situation with Covid-19 . He does not use benadryl. He has prn  HCTZ on his med list but states that he never uses it.   He is interested in changing from  warfarin to [place] and will let his coumadin clinic at [facility] know of his wishes to help him transfer. He gets his drugs thru the [place] so cost will not  be an issue.  Today, he denies symptoms of palpitations, chest pain, shortness of breath, orthopnea, PND, lower extremity edema, dizziness, presyncope, syncope, or neurologic sequela. The patient is tolerating medications without difficulties and is otherwise without complaint today.   Past Medical History:  Diagnosis Date  . Acute lower GI bleeding    related to gastritis / viral infection  . Cataract   . Cirrhosis ([place])   . COPD (chronic obstructive pulmonary disease) ([place])    PFT 05-05-2010, FEV1 .9 (26%) ratio 60  . Coronary artery disease   . Gastritis 06-10-2006  . Hiatal hernia 06-10-2006   EGD  . HOH (hard of hearing)   . Hx of adenomatous colonic polyps 2005, 2013   Colonoscopy  . Hypercholesteremia   . Hypertension   . Hypertr obst cardiomyop    mild subaortic stenosis  . Iron deficiency anemia, unspecified   . LVH (left ventricular hypertrophy)   . Other B-complex deficiencies   . Persistent atrial fibrillation (HCC)    persistent  . Rheumatic fever   .  Thrombocytopenia ([place]) 05/2008   Past Surgical History:  Procedure Laterality Date  . ATRIAL FLUTTER ABLATION  06/2010   CTI ablation performed by [name]  . CARDIAC CATHETERIZATION  05/28/2005   Widely patent coronaries and normal LV function.  Marland Kitchen CARDIAC CATHETERIZATION  08/11/2001   80% LAD stenosis successfully stented with a 3.5x71mm Cypher DES postdilated to 3.39mm resulting in reduction of 80% to 0%.  . CARDIAC CATHETERIZATION  01/15/2001   Focal 95% proximal RCA stenosis, stented with a 3x31mm Zeta multilink stent with postdilatation utilizing a 3.5x45mm Quantum balloon with stenosis being reduced to 0%.  . CARDIOVASCULAR STRESS TEST  05/29/2011   No scintigraphic evidence of inducible myocardial ischemia. No ECG changes. EKG negative for ischemia.  Marland Kitchen CARDIOVERSION N/A 10/20/2019   Procedure: CARDIOVERSION;  Surgeon: Sanda Klein, MD;  Location: [facility] ENDOSCOPY;  Service: Cardiovascular;  Laterality: N/A;  . CARDIOVERSION N/A 01/14/2020   Procedure: CARDIOVERSION;  Surgeon: Buford Dresser, MD;  Location: [facility] ENDOSCOPY;  Service: Cardiovascular;  Laterality: N/A;  . CORONARY ANGIOPLASTY     2002-2003  . SHOULDER SURGERY    . TRANSTHORACIC ECHOCARDIOGRAM  05/29/2011   EF >70%, severe concentric LV hypertrophy,  severe mitral annular calcification, mild-moderate aortic regurg,     Current Outpatient Medications  Medication Sig Dispense Refill  . apixaban (ELIQUIS) 5 MG TABS tablet Take 1 tablet (5 mg total) by mouth 2 (two) times daily. 180 tablet 1  . cholecalciferol (VITAMIN D) 1000 units tablet Take 1,000 Units by mouth daily.    . cyanocobalamin (CVS VITAMIN B12) 1000 MCG tablet Take 1 tablet (1,000 mcg total) by mouth daily. (Patient taking differently: Take 1,000 mcg by mouth every evening. )    . [START ON 02/14/2020] dofetilide (TIKOSYN) 500 MCG capsule Take 1 capsule (500 mcg total) by mouth 2 (two) times daily. 60 capsule 6  . [START ON 02/14/2020] furosemide (LASIX) 40 MG  tablet Take 1 tablet (40 mg total) by mouth daily. 30 tablet 6  . losartan (COZAAR) 50 MG tablet Take 50 mg by mouth daily.    Marland Kitchen omeprazole (PRILOSEC) 10 MG capsule Take 1 capsule (10 mg total) by mouth daily. 90 capsule 3  . polyethylene glycol (MIRALAX / GLYCOLAX) packet Take 17 g by mouth as needed for mild constipation.     Derrill Memo ON 02/14/2020] potassium chloride SA (KLOR-CON) 20 MEQ tablet Take 1 tablet (20 mEq total) by mouth daily. 30 tablet 6  . rosuvastatin (CRESTOR) 10 MG tablet Take 1 tablet (10 mg total) by mouth daily. (Patient taking differently: Take 10 mg by mouth every evening. ) 90 tablet 3  . tamsulosin (FLOMAX) 0.4 MG CAPS capsule Take 1 capsule (0.4 mg total) by mouth daily. (Patient taking differently: Take 0.4 mg by mouth at bedtime. ) 90 capsule 3   No current facility-administered medications for this encounter.    Allergies  Allergen Reactions  . Tetracycline Other (See Comments)    Issue with stomach lining - can take with food if needed    Social History   Socioeconomic History  . Marital status: Widowed    Spouse name: Not on file  . Number of children: 5  . Years of education: Not on file  . Highest education level: Not on file  Occupational History  . 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 Smoking status: Former Smoker    Packs/day: 1.00    Years: 10.00    Pack years: 10.00    Types: Cigarettes    Quit date: 02/26/1985    Years since quitting: 34.9  . Smokeless tobacco: Never Used  Vaping Use  . Vaping Use: Never used  Substance and Sexual Activity  . Alcohol use: No  . Drug use: No  . Sexual activity: Not on file  Other Topics Concern  . Not on file  Social History Narrative  . Not on file   Social Determinants of Health   Financial Resource Strain:   . Difficulty of Paying Living Expenses: Not on file  Food Insecurity:   . Worried About Charity fundraiser in the Last Year: Not on file  . Ran Out  of Food in the Last Year: Not on file  Transportation Needs:   . Lack of Transportation (Medical): Not on file  . Lack of Transportation (Non-Medical): Not on file  Physical Activity:   . Days of Exercise per Week: Not on file  . Minutes of Exercise per Session: Not on file  Stress:   . Feeling of Stress : Not on file  Social Connections:   . Frequency of Communication with Friends and Family: Not on file  .  Frequency of Social Gatherings with Friends and Family: Not on file  . Attends Religious Services: Not on file  . Active Member of Clubs or Organizations: Not on file  . Attends Archivist Meetings: Not on file  . Marital Status: Not on file  Intimate Partner Violence:   . Fear of Current or Ex-Partner: Not on file  . Emotionally Abused: Not on file  . Physically Abused: Not on file  . Sexually Abused: Not on file    Family History  Problem Relation Age of Onset  . Heart attack Brother 68       Ventircular rupture  . Emphysema Brother   . Lung cancer Brother   . Heart disease Mother        Enlarged heart  . Stroke Mother   . Hyperlipidemia Father   . CAD Father   . Hypertension Sister   . CAD Paternal Uncle   . Stroke Paternal Uncle   . Cancer Maternal Grandfather        colon  . Colon cancer Maternal Grandfather   . Asthma Brother     ROS- All systems are reviewed and negative except as per the HPI above  Physical Exam: Vitals:   01/20/20 1042  BP: (!) 126/46  Pulse: (!) 58  Weight: 80.7 kg  Height: 5' 8.5" (1.74 m)   Wt Readings from Last 3 Encounters:  01/20/20 80.7 kg  01/15/20 77.7 kg  01/12/20 81.3 kg    Labs: Lab Results  Component Value Date   NA 138 01/15/2020   K 4.5 01/15/2020   CL 95 (L) 01/15/2020   CO2 36 (H) 01/15/2020   GLUCOSE 110 (H) 01/15/2020   BUN 23 01/15/2020   CREATININE 0.87 01/15/2020   CALCIUM 8.5 (L) 01/15/2020   MG 2.0 01/15/2020   Lab Results  Component Value Date   INR 1.3 (H) 01/13/2020   Lab  Results  Component Value Date   CHOL 128 03/11/2019   HDL 35 (L) 03/11/2019   LDLCALC 74 03/11/2019   TRIG 103 03/11/2019     GEN- The patient is well appearing, alert and oriented x 3 today.   Head- normocephalic, atraumatic Eyes-  Sclera clear, conjunctiva pink Ears- hearing intact Oropharynx- clear Neck- supple, no JVP Lymph- no cervical lymphadenopathy Lungs- Clear to ausculation bilaterally, normal work of breathing Heart- irregular rate and rhythm, no murmurs, rubs or gallops, PMI not laterally displaced GI- soft, NT, ND, + BS Extremities- no clubbing, cyanosis, or edema MS- no significant deformity or atrophy Skin- no rash or lesion Psych- euthymic mood, full affect Neuro- strength and sensation are intact  EKG-aflutter at 60 bpm, qrs int 148 ms, qtc 472 ms    Assessment and Plan: 1. Atrial  flutter  Successful cardioversion but is now back in flutter Was not aware but state he felt great the first day after cardioversion  We discussed options form Dr. Jackalyn Lombard note He would prefer Tikosyn after norpace washout when elective beds are available Will send meds to pharmD for  screening  No benadryl use He has hctz prn, he states that he does not use Last K+/mag in Epic in range  He plans to get drug thru the [place]  2. CHA2DS2VASc score of 5 He wishes to tranfer from  warfarin to [place] I will let [facility] coumadin clinic be aware to help transition   We will be in touch when elective bed admission restriction is lifted for tikosyn admit  Butch Penny  Eulah Pont, [facility] [address] [place], [place] [address] [phone]

## 2020-01-25 ENCOUNTER — Ambulatory Visit: Payer: Medicare PPO | Admitting: Cardiovascular Disease

## 2020-01-29 ENCOUNTER — Ambulatory Visit: Payer: Medicare PPO | Admitting: Student

## 2020-01-29 ENCOUNTER — Encounter: Payer: Self-pay | Admitting: Student

## 2020-01-29 ENCOUNTER — Other Ambulatory Visit: Payer: Self-pay

## 2020-01-29 VITALS — BP 130/68 | HR 93 | Ht 68.5 in | Wt 178.0 lb

## 2020-01-29 DIAGNOSIS — I2510 Atherosclerotic heart disease of native coronary artery without angina pectoris: Secondary | ICD-10-CM | POA: Diagnosis not present

## 2020-01-29 DIAGNOSIS — I4819 Other persistent atrial fibrillation: Secondary | ICD-10-CM

## 2020-01-29 DIAGNOSIS — J9611 Chronic respiratory failure with hypoxia: Secondary | ICD-10-CM

## 2020-01-29 DIAGNOSIS — J449 Chronic obstructive pulmonary disease, unspecified: Secondary | ICD-10-CM

## 2020-01-29 DIAGNOSIS — I351 Nonrheumatic aortic (valve) insufficiency: Secondary | ICD-10-CM | POA: Diagnosis not present

## 2020-01-29 DIAGNOSIS — I421 Obstructive hypertrophic cardiomyopathy: Secondary | ICD-10-CM | POA: Diagnosis not present

## 2020-01-29 NOTE — Patient Instructions (Signed)
Medication Instructions:  Your physician recommends that you continue on your current medications as directed. Please refer to the Current Medication list given to you today.   *If you need a refill on your cardiac medications before your next appointment, please call your pharmacy*   Lab Work: Bmp, Magnesium- Today   If you have labs (blood work) drawn today and your tests are completely normal, you will receive your results only by: Marland Kitchen MyChart Message (if you have MyChart) OR . A paper copy in the mail If you have any lab test that is abnormal or we need to change your treatment, we will call you to review the results.   Testing/Procedures: Your physician has referred you to see Pulmonology    Follow-Up: At [facility], you and your health needs are our priority.  As part of our continuing mission to provide you with exceptional heart care, we have created designated Provider Care Teams.  These Care Teams include your primary Cardiologist (physician) and Advanced Practice Providers (APPs -  Physician Assistants and Nurse Practitioners) who all work together to provide you with the care you need, when you need it.  We recommend signing up for the patient portal called "MyChart".  Sign up information is provided on this After Visit Summary.  MyChart is used to connect with patients for Virtual Visits (Telemedicine).  Patients are able to view lab/test results, encounter notes, upcoming appointments, etc.  Non-urgent messages can be sent to your provider as well.   To learn more about what you can do with MyChart, go to [email or web address].    Your next appointment:   5 week(s)  The format for your next appointment:   In Person  Provider:   Legrand Como "Oda Kilts, PA-C   Other Instructions None

## 2020-01-29 NOTE — Progress Notes (Signed)
PCP:  [name], [name], MD Primary Cardiologist: Shelva Majestic, MD Electrophysiologist: Thompson Grayer, MD   AAHIL FREDIN is a 74 y.o. male seen today for Thompson Grayer, MD for post hospital follow up.  Since discharge from hospital the patient reports doing well. His peripheral edema comes and goes. He states his legs were normal when he got up this am, but he currently has 2+ edema half way to his knees. He sometimes forgets to keep his O2 on at home, and has noted O2 sats as low as the upper 70s with exertion OFF O2. He maintains O2 sats > 90 on oxygen. He drinks 5-6 eight oz glasses of green tea, as well as additional water and other beverages. Limits salt intake. he denies chest pain, palpitations,  nausea, vomiting, dizziness, syncope, weight gain, or early satiety.  Past Medical History:  Diagnosis Date  . Acute lower GI bleeding    related to gastritis / viral infection  . Cataract   . Cirrhosis ([place])   . COPD (chronic obstructive pulmonary disease) ([place])    PFT 05-05-2010, FEV1 .9 (26%) ratio 60  . Coronary artery disease   . Gastritis 06-10-2006  . Hiatal hernia 06-10-2006   EGD  . HOH (hard of hearing)   . Hx of adenomatous colonic polyps 2005, 2013   Colonoscopy  . Hypercholesteremia   . Hypertension   . Hypertr obst cardiomyop    mild subaortic stenosis  . Iron deficiency anemia, unspecified   . LVH (left ventricular hypertrophy)   . Other B-complex deficiencies   . Persistent atrial fibrillation (HCC)    persistent  . Rheumatic fever   . Thrombocytopenia ([place]) 05/2008   Past Surgical History:  Procedure Laterality Date  . ATRIAL FLUTTER ABLATION  06/2010   CTI ablation performed by [name]  . CARDIAC CATHETERIZATION  05/28/2005   Widely patent coronaries and normal LV function.  Marland Kitchen CARDIAC CATHETERIZATION  08/11/2001   80% LAD stenosis successfully stented with a 3.5x59mm Cypher DES postdilated to 3.55mm resulting in reduction of 80% to 0%.  . CARDIAC  CATHETERIZATION  01/15/2001   Focal 95% proximal RCA stenosis, stented with a 3x70mm Zeta multilink stent with postdilatation utilizing a 3.5x4mm Quantum balloon with stenosis being reduced to 0%.  . CARDIOVASCULAR STRESS TEST  05/29/2011   No scintigraphic evidence of inducible myocardial ischemia. No ECG changes. EKG negative for ischemia.  Marland Kitchen CARDIOVERSION N/A 10/20/2019   Procedure: CARDIOVERSION;  Surgeon: Sanda Klein, MD;  Location: [facility] ENDOSCOPY;  Service: Cardiovascular;  Laterality: N/A;  . CARDIOVERSION N/A 01/14/2020   Procedure: CARDIOVERSION;  Surgeon: Buford Dresser, MD;  Location: [facility] ENDOSCOPY;  Service: Cardiovascular;  Laterality: N/A;  . CORONARY ANGIOPLASTY     2002-2003  . SHOULDER SURGERY    . TRANSTHORACIC ECHOCARDIOGRAM  05/29/2011   EF >70%, severe concentric LV hypertrophy, severe mitral annular calcification, mild-moderate aortic regurg,     Current Outpatient Medications  Medication Sig Dispense Refill  . apixaban (ELIQUIS) 5 MG TABS tablet Take 1 tablet (5 mg total) by mouth 2 (two) times daily. 180 tablet 1  . cholecalciferol (VITAMIN D) 1000 units tablet Take 1,000 Units by mouth daily.    . cyanocobalamin ([employer]S VITAMIN B12) 1000 MCG tablet Take 1 tablet (1,000 mcg total) by mouth daily. (Patient taking differently: Take 1,000 mcg by mouth every evening. )    . [START ON 02/14/2020] dofetilide (TIKOSYN) 500 MCG capsule Take 1 capsule (500 mcg total) by mouth 2 (two) times  daily. 60 capsule 6  . [START ON 02/14/2020] furosemide (LASIX) 40 MG tablet Take 1 tablet (40 mg total) by mouth daily. 30 tablet 6  . losartan (COZAAR) 50 MG tablet Take 50 mg by mouth daily.    Marland Kitchen omeprazole (PRILOSEC) 10 MG capsule Take 1 capsule (10 mg total) by mouth daily. 90 capsule 3  . polyethylene glycol (MIRALAX / GLYCOLAX) packet Take 17 g by mouth as needed for mild constipation.     . rosuvastatin (CRESTOR) 10 MG tablet Take 1 tablet (10 mg total) by mouth daily. (Patient  taking differently: Take 10 mg by mouth every evening. ) 90 tablet 3  . tamsulosin (FLOMAX) 0.4 MG CAPS capsule Take 1 capsule (0.4 mg total) by mouth daily. (Patient taking differently: Take 0.4 mg by mouth at bedtime. ) 90 capsule 3   No current facility-administered medications for this visit.    Allergies  Allergen Reactions  . Tetracycline Other (See Comments)    Issue with stomach lining - can take with food if needed    Social History   Socioeconomic History  . Marital status: Widowed    Spouse name: Not on file  . Number of children: 5  . Years of education: Not on file  . Highest education level: Not on file  Occupational History  . 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 Smoking status: Former Smoker    Packs/day: 1.00    Years: 10.00    Pack years: 10.00    Types: Cigarettes    Quit date: 02/26/1985    Years since quitting: 34.9  . Smokeless tobacco: Never Used  Vaping Use  . Vaping Use: Never used  Substance and Sexual Activity  . Alcohol use: No  . Drug use: No  . Sexual activity: Not on file  Other Topics Concern  . Not on file  Social History Narrative  . Not on file   Social Determinants of Health   Financial Resource Strain:   . Difficulty of Paying Living Expenses: Not on file  Food Insecurity:   . Worried About Charity fundraiser in the Last Year: Not on file  . Ran Out of Food in the Last Year: Not on file  Transportation Needs:   . Lack of Transportation (Medical): Not on file  . Lack of Transportation (Non-Medical): Not on file  Physical Activity:   . Days of Exercise per Week: Not on file  . Minutes of Exercise per Session: Not on file  Stress:   . Feeling of Stress : Not on file  Social Connections:   . Frequency of Communication with Friends and Family: Not on file  . Frequency of Social Gatherings with Friends and Family: Not on file  . Attends Religious Services: Not on file  . Active Member of  Clubs or Organizations: Not on file  . Attends Archivist Meetings: Not on file  . Marital Status: Not on file  Intimate Partner Violence:   . Fear of Current or Ex-Partner: Not on file  . Emotionally Abused: Not on file  . Physically Abused: Not on file  . Sexually Abused: Not on file     Review of Systems: All other systems reviewed and are otherwise negative except as noted above.  Physical Exam: Vitals:   01/29/20 1301  BP: 130/68  Pulse: 93  SpO2: 98%  Weight: 178 lb (80.7 kg)  Height: 5' 8.5" (1.74 m)  GEN- The patient is elderly appearing, alert and oriented x 3 today.   HEENT: normocephalic, atraumatic; sclera clear, conjunctiva pink; hearing intact; oropharynx clear; neck supple, no JVP Lymph- no cervical lymphadenopathy Lungs- Clear to ausculation bilaterally, normal work of breathing.  No wheezes, rales, rhonchi Heart- Regular rate and rhythm, no murmurs, rubs or gallops, PMI not laterally displaced GI- soft, non-tender, non-distended, bowel sounds present, no hepatosplenomegaly Extremities- no clubbing. 2+ edema 1/2 way to knees. + hemosiderin staining/chronic venous stasis changes bilaterally. , edema; DP/PT/radial pulses 2+ bilaterally MS- no significant deformity or atrophy Skin- warm and dry, no rash or lesion Psych- euthymic mood, full affect Neuro- strength and sensation are intact  EKG is not ordered. Personal review of EKG from 01/20/2020 shows sinus bradycardia at 58 bpm, QTC ~480ms  Additional studies reviewed include: Previous office notes.   Assessment and Plan:  1. Persistent atrial fibrillation / atrial flutter Regular on exam today.  Continue Tikosyn 500 mcg BID  Continue Eliquis for CHA2DS2VASC of at least 4.   K 5.5 on 11/24. BMET and Mg today  2. Chronic chronic diastolic CHF Volume status at least mildly elevated in setting of fluid intake > 2L daily.  Continue lasix 40 mg daily. Discussed sliding scale diuretics. He can  take an additional 20 mg as needed. When he takes an extra 20 mg, he should take a potassium tablet.  (Pending labwork today).  Encouraged fluid restriction to <2L daily, and sodium restriction.  Confounded by chronic venous stasis.   3. COPD with chronic hypoxic respiratory failure Continue O2.  This is a new requirement and he reports quite profound hypoxia at home if he forgets to wear his O2.  Will refer to pulmonary for further evaluation and treatment.   Labs today. RTC 4 weeks.   Shirley Friar, PA-C  01/29/20 1:03 PM

## 2020-01-30 LAB — BASIC METABOLIC PANEL
BUN/Creatinine Ratio: 29 — ABNORMAL HIGH (ref 10–24)
BUN: 27 mg/dL (ref 8–27)
CO2: 41 mmol/L (ref 20–29)
Calcium: 9 mg/dL (ref 8.6–10.2)
Chloride: 95 mmol/L — ABNORMAL LOW (ref 96–106)
Creatinine, Ser: 0.94 mg/dL (ref 0.76–1.27)
GFR calc Af Amer: 91 mL/min/{1.73_m2} (ref >59)
GFR calc non Af Amer: 79 mL/min/{1.73_m2} (ref >59)
Glucose: 105 mg/dL — ABNORMAL HIGH (ref 65–99)
Potassium: 4.8 mmol/L (ref 3.5–5.2)
Sodium: 142 mmol/L (ref 134–144)

## 2020-01-30 LAB — MAGNESIUM: Magnesium: 2.3 mg/dL (ref 1.6–2.3)

## 2020-02-02 ENCOUNTER — Other Ambulatory Visit: Payer: Self-pay

## 2020-02-02 ENCOUNTER — Encounter: Payer: Self-pay | Admitting: Pulmonary Disease

## 2020-02-02 ENCOUNTER — Ambulatory Visit: Payer: Medicare PPO | Admitting: Pulmonary Disease

## 2020-02-02 VITALS — BP 136/68 | HR 72 | Ht 68.5 in | Wt 168.0 lb

## 2020-02-02 DIAGNOSIS — R942 Abnormal results of pulmonary function studies: Secondary | ICD-10-CM | POA: Diagnosis not present

## 2020-02-02 DIAGNOSIS — J984 Other disorders of lung: Secondary | ICD-10-CM | POA: Diagnosis not present

## 2020-02-02 DIAGNOSIS — J9611 Chronic respiratory failure with hypoxia: Secondary | ICD-10-CM

## 2020-02-02 DIAGNOSIS — I272 Pulmonary hypertension, unspecified: Secondary | ICD-10-CM

## 2020-02-02 NOTE — Patient Instructions (Signed)
Follow up in 2 months with pulmonary function testing  We will schedule you for split night sleep study   Continue supplemental oxygen therapy and lasix therapy

## 2020-02-02 NOTE — Progress Notes (Signed)
Synopsis: Referred by Shirley Friar, PA for respiratory failure  Subjective:   PATIENT ID: Joshua [name] GENDER: male DOB: February 03, 1946, MRN: [id number]   HPI  Chief Complaint  Patient presents with  . Consult    Pt is being referred by [name]. Pt has had complaints of SOB with exertion. Pt is on 2L O2 24/7.   Joshua [name] is a 74 year old male, former smoker with atrial fibrillation, HOCM, coronary artery disease, and chronic pleural parenchymal scarring with small bilateral chronic fibrothoraces who is referred to pulmonary clinic for respiratory failure.   He was admitted for tikosyn loading and direct current cardioversion on 01/14/20. During this admission he was noted to have a new oxygen requirement and volume overload. He was diuresed with lasix but continued to require supplemental oxygen. Prior to discharge he went from sinus rhythm back into atrial fibrillation vs atrial flutter.   He first noted having issues with shortness of breath 5 years ago that would come and go with exertion, especially walking inclines. The shortness of breath was also worse in hot weather. He has noted increased swelling in his lower extremities. He denies orthopnea or PND.   PFTs from 2012 show FEV1/FVC ratio of 73, FEV1 1.23L (41%) and FVC 1.68L (38%). TLC 3.94L (62%) and DLCO 42% consistent with a mild restrictive defect and mild diffusion defect.   On chest imaging review, he had a left pleural effusion as early as 2006 and underwent a thoracentesis at that time. I am not able to see the pleural fluid studies but the cytology from that sample was negative for malignant cells. In 2008 he was noted to have bilateral pleural effusions at that time. He appears to have had a complicated history involving both pleural spaces that developed a fibrotic component as noted from a CT chest with contrast in 2012 and again on CT abdomen of 2016 with some rounded atelectasis of the right lower lobe.       He has persistent elevation in his serum bicarb on chemistries over the past couple of years. He denies any issues with his sleep or waking up with shortness of breath.  Past Medical History:  Diagnosis Date  . Acute lower GI bleeding    related to gastritis / viral infection  . Cataract   . Cirrhosis ([place])   . COPD (chronic obstructive pulmonary disease) ([place])    PFT 05-05-2010, FEV1 .9 (26%) ratio 60  . Coronary artery disease   . Gastritis 06-10-2006  . Hiatal hernia 06-10-2006   EGD  . HOH (hard of hearing)   . Hx of adenomatous colonic polyps 2005, 2013   Colonoscopy  . Hypercholesteremia   . Hypertension   . Hypertr obst cardiomyop    mild subaortic stenosis  . Iron deficiency anemia, unspecified   . LVH (left ventricular hypertrophy)   . Other B-complex deficiencies   . Persistent atrial fibrillation (HCC)    persistent  . Rheumatic fever   . Thrombocytopenia ([place]) 05/2008     Family History  Problem Relation Age of Onset  . Heart attack Brother 4       Ventircular rupture  . Emphysema Brother   . Lung cancer Brother   . Heart disease Mother        Enlarged heart  . Stroke Mother   . Hyperlipidemia Father   . CAD Father   . Hypertension Sister   . CAD Paternal Uncle   . Stroke Paternal Uncle   .  Cancer Maternal Grandfather        colon  . Colon cancer Maternal Grandfather   . Asthma Brother      Social History   Socioeconomic History  . Marital status: Widowed    Spouse name: Not on file  . Number of children: 5  . Years of education: Not on file  . Highest education level: Not on file  Occupational History  . 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 Smoking status: Former Smoker    Packs/day: 1.00    Years: 10.00    Pack years: 10.00    Types: Cigarettes    Quit date: 02/26/1985    Years since quitting: 34.9  . Smokeless tobacco: Never Used  Vaping Use  . Vaping Use: Never used  Substance and Sexual Activity   . Alcohol use: No  . Drug use: No  . Sexual activity: Not on file  Other Topics Concern  . Not on file  Social History Narrative  . Not on file   Social Determinants of Health   Financial Resource Strain: Not on file  Food Insecurity: Not on file  Transportation Needs: Not on file  Physical Activity: Not on file  Stress: Not on file  Social Connections: Not on file  Intimate Partner Violence: Not on file     Allergies  Allergen Reactions  . Tetracycline Other (See Comments)    Issue with stomach lining - can take with food if needed     Outpatient Medications Prior to Visit  Medication Sig Dispense Refill  . apixaban (ELIQUIS) 5 MG TABS tablet Take 1 tablet (5 mg total) by mouth 2 (two) times daily. 180 tablet 1  . cholecalciferol (VITAMIN D) 1000 units tablet Take 1,000 Units by mouth daily.    . cyanocobalamin ([employer]S VITAMIN B12) 1000 MCG tablet Take 1 tablet (1,000 mcg total) by mouth daily.    Derrill Memo ON 02/14/2020] dofetilide (TIKOSYN) 500 MCG capsule Take 1 capsule (500 mcg total) by mouth 2 (two) times daily. 60 capsule 6  . [START ON 02/14/2020] furosemide (LASIX) 40 MG tablet Take 1 tablet (40 mg total) by mouth daily. 30 tablet 6  . losartan (COZAAR) 50 MG tablet Take 50 mg by mouth daily.    Marland Kitchen omeprazole (PRILOSEC) 10 MG capsule Take 1 capsule (10 mg total) by mouth daily. 90 capsule 3  . polyethylene glycol (MIRALAX / GLYCOLAX) packet Take 17 g by mouth as needed for mild constipation.     . rosuvastatin (CRESTOR) 10 MG tablet Take 1 tablet (10 mg total) by mouth daily. 90 tablet 3  . tamsulosin (FLOMAX) 0.4 MG CAPS capsule Take 1 capsule (0.4 mg total) by mouth daily. 90 capsule 3   No facility-administered medications prior to visit.    Review of Systems  Constitutional: Negative for chills, fever, malaise/fatigue and weight loss.  HENT: Negative for congestion, sinus pain and sore throat.   Eyes: Negative.   Respiratory: Positive for shortness of breath.  Negative for cough, hemoptysis, sputum production and wheezing.   Cardiovascular: Positive for leg swelling. Negative for chest pain, palpitations, orthopnea, claudication and PND.  Gastrointestinal: Negative for abdominal pain, heartburn, nausea and vomiting.  Genitourinary: Negative.   Musculoskeletal: Negative.   Skin: Negative.   Neurological: Negative for dizziness, weakness and headaches.  Endo/Heme/Allergies: Negative.   Psychiatric/Behavioral: Negative.     Objective:   Vitals:   02/02/20 1021  BP: 136/68  Pulse: 72  SpO2: 97%  Weight: 168 lb (76.2 kg)  Height: 5' 8.5" (1.74 m)    Physical Exam Constitutional:      General: He is not in acute distress.    Appearance: He is normal weight.  HENT:     Head: Normocephalic and atraumatic.     Mouth/Throat:     Mouth: Mucous membranes are moist.     Pharynx: Oropharynx is clear.  Eyes:     General: No scleral icterus.    Conjunctiva/sclera: Conjunctivae normal.     Pupils: Pupils are equal, round, and reactive to light.  Pulmonary:     Effort: Pulmonary effort is normal.     Breath sounds: Decreased air movement present. Rales present. No wheezing or rhonchi.  Abdominal:     General: Bowel sounds are normal.     Palpations: Abdomen is soft.  Musculoskeletal:     Right lower leg: Edema (trace) present.     Left lower leg: Edema (trace) present.  Skin:    General: Skin is warm and dry.  Neurological:     General: No focal deficit present.     Mental Status: He is alert.  Psychiatric:        Mood and Affect: Mood normal.        Behavior: Behavior normal.        Thought Content: Thought content normal.        Judgment: Judgment normal.     CBC    Component Value Date/Time   WBC 5.6 06/12/2019 1211   WBC 6.6 01/16/2015 0312   RBC 4.80 06/12/2019 1211   RBC 4.33 01/16/2015 0312   HGB 18.4 (H) 10/20/2019 1138   HGB 15.5 06/12/2019 1211   HCT 54.0 (H) 10/20/2019 1138   HCT 45.8 06/12/2019 1211   PLT 127  (L) 06/12/2019 1211   MCV 95 06/12/2019 1211   MCH 32.3 06/12/2019 1211   MCH 31.4 01/16/2015 0312   MCHC 33.8 06/12/2019 1211   MCHC 32.7 01/16/2015 0312   RDW 12.4 06/12/2019 1211   LYMPHSABS 1.0 03/11/2019 0849   MONOABS 0.1 03/31/2010 1739   EOSABS 0.2 03/11/2019 0849   BASOSABS 0.1 03/11/2019 0849   BMP Latest Ref Rng & Units 01/29/2020 01/20/2020 01/15/2020  Glucose 65 - 99 mg/dL 105(H) 86 110(H)  BUN 8 - 27 mg/dL 27 29(H) 23  Creatinine 0.76 - 1.27 mg/dL 0.94 0.86 0.87  BUN/Creat Ratio 10 - 24 29(H) - -  Sodium 134 - 144 mmol/L 142 141 138  Potassium 3.5 - 5.2 mmol/L 4.8 5.5(H) 4.5  Chloride 96 - 106 mmol/L 95(L) 93(L) 95(L)  CO2 20 - 29 mmol/L 41(HH) 43(H) 36(H)  Calcium 8.6 - 10.2 mg/dL 9.0 9.1 8.5(L)   Chest imaging: Reviewed as per HPI  PFT: 2012 FEV1/FVC 73 FVC 38 % FEV1 41% TLC 62% DLCO 42%  Echo: 10/08/19 1. The severe MAC projects into the LVOT, causing flow turbulence.  However, no significant LVOT gradient noted.. Left ventricular ejection  fraction, by estimation, is 60 to 65%. The left ventricle has normal  function. The left ventricle has no regional  wall motion abnormalities. There is severe concentric left ventricular  hypertrophy. Left ventricular diastolic function could not be evaluated.  2. Right ventricular systolic function is low normal. The right  ventricular size is mildly enlarged. There is mildly elevated pulmonary  artery systolic pressure.  3. Left atrial size was moderately dilated.  4. Right atrial size was moderately dilated.  5. The mitral  valve is degenerative. Mild mitral valve regurgitation.  Mild mitral stenosis. The mean mitral valve gradient is 7.0 mmHg.  6. The aortic valve is tricuspid. Aortic valve regurgitation is moderate.  7. The inferior vena cava is normal in size with greater than 50%  respiratory variability, suggesting right atrial pressure of 3 mmHg.    Assessment & Plan:   Chronic hypoxemic respiratory  failure (HCC)  Restrictive lung disease - Plan: Split night study, Pulmonary Function Test  Decreased diffusion capacity  Discussion: Joshua [name] is a 74 year old male, former smoker with atrial fibrillation, HOCM, coronary artery disease, and chronic pleural parenchymal scarring with small bilateral chronic fibrothoraces who is referred to pulmonary clinic for respiratory failure.  He has chronic respiratory failure secondary to complicated history of pleural effusions that appear to have developed into chronic fibrothoraces leading to restrictive lung disease based on pulmonary function tests from 2012. There is also a component of chronic hypercapnic respiratory failure as well based on his elevated serum bicarbonate level. Unfortunately there is nothing that can be done about the fibrothoraces at this time. We will schedule him for a split night sleep study to determine if he would benefit from non-invasive positive pressure ventilation at night. We will also obtain an updated pulmonary function test.   Follow up in 2 months.  >60 minutes was spent in direct patient contact, extensive chart review and documentation.   Freda Jackson, MD [facility] Office: [phone]   See Amion for Pager Details     Current Outpatient Medications:  .  apixaban (ELIQUIS) 5 MG TABS tablet, Take 1 tablet (5 mg total) by mouth 2 (two) times daily., Disp: 180 tablet, Rfl: 1 .  cholecalciferol (VITAMIN D) 1000 units tablet, Take 1,000 Units by mouth daily., Disp: , Rfl:  .  cyanocobalamin ([employer]S VITAMIN B12) 1000 MCG tablet, Take 1 tablet (1,000 mcg total) by mouth daily., Disp: , Rfl:  .  [START ON 02/14/2020] dofetilide (TIKOSYN) 500 MCG capsule, Take 1 capsule (500 mcg total) by mouth 2 (two) times daily., Disp: 60 capsule, Rfl: 6 .  [START ON 02/14/2020] furosemide (LASIX) 40 MG tablet, Take 1 tablet (40 mg total) by mouth daily., Disp: 30 tablet, Rfl: 6 .  losartan (COZAAR) 50  MG tablet, Take 50 mg by mouth daily., Disp: , Rfl:  .  omeprazole (PRILOSEC) 10 MG capsule, Take 1 capsule (10 mg total) by mouth daily., Disp: 90 capsule, Rfl: 3 .  polyethylene glycol (MIRALAX / GLYCOLAX) packet, Take 17 g by mouth as needed for mild constipation. , Disp: , Rfl:  .  rosuvastatin (CRESTOR) 10 MG tablet, Take 1 tablet (10 mg total) by mouth daily., Disp: 90 tablet, Rfl: 3 .  tamsulosin (FLOMAX) 0.4 MG CAPS capsule, Take 1 capsule (0.4 mg total) by mouth daily., Disp: 90 capsule, Rfl: 3

## 2020-02-24 ENCOUNTER — Ambulatory Visit: Payer: Medicare PPO | Attending: Pulmonary Disease | Admitting: Pulmonary Disease

## 2020-02-24 ENCOUNTER — Other Ambulatory Visit: Payer: Self-pay

## 2020-02-24 DIAGNOSIS — J984 Other disorders of lung: Secondary | ICD-10-CM

## 2020-02-24 DIAGNOSIS — R0902 Hypoxemia: Secondary | ICD-10-CM | POA: Insufficient documentation

## 2020-02-29 ENCOUNTER — Other Ambulatory Visit: Payer: Self-pay

## 2020-02-29 ENCOUNTER — Encounter: Payer: Self-pay | Admitting: Student

## 2020-02-29 ENCOUNTER — Ambulatory Visit: Payer: Medicare PPO | Admitting: Student

## 2020-02-29 VITALS — BP 122/60 | HR 68 | Ht 68.5 in | Wt 176.0 lb

## 2020-02-29 DIAGNOSIS — I5032 Chronic diastolic (congestive) heart failure: Secondary | ICD-10-CM | POA: Diagnosis not present

## 2020-02-29 DIAGNOSIS — J449 Chronic obstructive pulmonary disease, unspecified: Secondary | ICD-10-CM | POA: Diagnosis not present

## 2020-02-29 DIAGNOSIS — I4819 Other persistent atrial fibrillation: Secondary | ICD-10-CM | POA: Diagnosis not present

## 2020-02-29 LAB — MAGNESIUM: Magnesium: 2.2 mg/dL (ref 1.6–2.3)

## 2020-02-29 LAB — BASIC METABOLIC PANEL
BUN/Creatinine Ratio: 28 — ABNORMAL HIGH (ref 10–24)
BUN: 26 mg/dL (ref 8–27)
CO2: 40 mmol/L — ABNORMAL HIGH (ref 20–29)
Calcium: 8.8 mg/dL (ref 8.6–10.2)
Chloride: 96 mmol/L (ref 96–106)
Creatinine, Ser: 0.92 mg/dL (ref 0.76–1.27)
GFR calc Af Amer: 94 mL/min/{1.73_m2} (ref >59)
GFR calc non Af Amer: 81 mL/min/{1.73_m2} (ref >59)
Glucose: 105 mg/dL — ABNORMAL HIGH (ref 65–99)
Potassium: 4.8 mmol/L (ref 3.5–5.2)
Sodium: 142 mmol/L (ref 134–144)

## 2020-02-29 NOTE — Procedures (Signed)
    Patient Name: Joshua [name], Joshua [name] Date: 02/24/2020 Gender: Male D.O.B: January 31, 1946 Age (years): 89 Referring Provider: Melody Comas Height (inches): 68 Interpreting Physician: Coralyn Helling MD, ABSM Weight (lbs): 168 RPSGT: Peak, Robert BMI: 26 MRN: [id number] Neck Size: 15.50  CLINICAL INFORMATION Sleep Study Type: NPSG  Indication for sleep study: History of restrictive lung disease and chronic hypoxic respiratory failure.  Referred for assessment of sleep disordered breathing.  SLEEP STUDY TECHNIQUE As per the AASM Manual for the Scoring of Sleep and Associated Events v2.3 (April 2016) with a hypopnea requiring 4% desaturations.  The channels recorded and monitored were frontal, central and occipital EEG, electrooculogram (EOG), submentalis EMG (chin), nasal and oral airflow, thoracic and abdominal wall motion, anterior tibialis EMG, snore microphone, electrocardiogram, and pulse oximetry.  MEDICATIONS Medications self-administered by patient taken the night of the study : N/A  SLEEP ARCHITECTURE The study was initiated at 9:56:10 PM and ended at 5:01:32 AM.  Sleep onset time was 12.5 minutes and the sleep efficiency was 88.8%. The total sleep time was 377.8 minutes.  Stage REM latency was 58.0 minutes.  The patient spent 2.91% of the night in stage N1 sleep, 82.53% in stage N2 sleep, 0.26% in stage N3 and 14.3% in REM.  Alpha intrusion was absent.  Supine sleep was 100.00%.  RESPIRATORY PARAMETERS The overall apnea/hypopnea index (AHI) was 1.4 per hour. There were 0 total apneas, including 0 obstructive, 0 central and 0 mixed apneas. There were 9 hypopneas and 4 RERAs.  The AHI during Stage REM sleep was 3.3 per hour.  AHI while supine was 1.4 per hour.  The mean oxygen saturation was 95.54%. The minimum SpO2 during sleep was 71.00%.  He spent 71 minutes of sleep time with an SpO2 < 88% in the abscence of other respiratory events.  He required 3 liters  supplemental oxygen during this study.  snoring was noted during this study.  CARDIAC DATA The 2 lead EKG demonstrated sinus rhythm. The mean heart rate was 67.64 beats per minute. Other EKG findings include: PVCs.  LEG MOVEMENT DATA The total PLMS were 47 with a resulting PLMS index of 7.46. Associated arousal with leg movement index was 0.0 .  IMPRESSIONS - No significant obstructive sleep apnea occurred during this study (AHI = 1.4/h). - No significant central sleep apnea occurred during this study (CAI = 0.0/h). - He had significant oxygen desaturation in the abscence of other respiratory events.  This improved after the addition of 3 liters oxygen.  DIAGNOSIS - Nocturnal Hypoxemia (G47.36)  RECOMMENDATIONS - 3 liters supplemental oxygen at night with sleep. - Avoid alcohol, sedatives and other CNS depressants that may worsen sleep apnea and disrupt normal sleep architecture. - Sleep hygiene should be reviewed to assess factors that may improve sleep quality.  [Electronically signed] 02/29/2020 11:24 AM  Coralyn Helling MD, ABSM Diplomate, American Board of Sleep Medicine   NPI: [id number]

## 2020-02-29 NOTE — Progress Notes (Signed)
PCP:  [name], [name], MD Primary Cardiologist: Shelva Majestic, MD Electrophysiologist: Thompson Grayer, MD   Joshua [name] is a 75 y.o. male seen today for Thompson Grayer, MD for routine electrophysiology followup.  Since last being seen in our clinic the patient reports doing well. He has been seen by pulmonary. He has undergone sleep study (pending) and is planned for PFTs 2/7. HE continues to wear O2. He denies SOB, but reports rapid 02 desaturation if he does without his O2.   he denies chest pain, palpitations, dyspnea, PND, orthopnea, nausea, vomiting, dizziness, syncope, edema, weight gain, or early satiety.  Past Medical History:  Diagnosis Date  . Acute lower GI bleeding    related to gastritis / viral infection  . Cataract   . Cirrhosis ([place])   . COPD (chronic obstructive pulmonary disease) ([place])    PFT 05-05-2010, FEV1 .9 (26%) ratio 60  . Coronary artery disease   . Gastritis 06-10-2006  . Hiatal hernia 06-10-2006   EGD  . HOH (hard of hearing)   . Hx of adenomatous colonic polyps 2005, 2013   Colonoscopy  . Hypercholesteremia   . Hypertension   . Hypertr obst cardiomyop    mild subaortic stenosis  . Iron deficiency anemia, unspecified   . LVH (left ventricular hypertrophy)   . Other B-complex deficiencies   . Persistent atrial fibrillation (HCC)    persistent  . Rheumatic fever   . Thrombocytopenia ([place]) 05/2008   Past Surgical History:  Procedure Laterality Date  . ATRIAL FLUTTER ABLATION  06/2010   CTI ablation performed by [name]  . CARDIAC CATHETERIZATION  05/28/2005   Widely patent coronaries and normal LV function.  Marland Kitchen CARDIAC CATHETERIZATION  08/11/2001   80% LAD stenosis successfully stented with a 3.5x40mm Cypher DES postdilated to 3.41mm resulting in reduction of 80% to 0%.  . CARDIAC CATHETERIZATION  01/15/2001   Focal 95% proximal RCA stenosis, stented with a 3x95mm Zeta multilink stent with postdilatation utilizing a 3.5x42mm Quantum balloon with  stenosis being reduced to 0%.  . CARDIOVASCULAR STRESS TEST  05/29/2011   No scintigraphic evidence of inducible myocardial ischemia. No ECG changes. EKG negative for ischemia.  Marland Kitchen CARDIOVERSION N/A 10/20/2019   Procedure: CARDIOVERSION;  Surgeon: Sanda Klein, MD;  Location: [facility] ENDOSCOPY;  Service: Cardiovascular;  Laterality: N/A;  . CARDIOVERSION N/A 01/14/2020   Procedure: CARDIOVERSION;  Surgeon: Buford Dresser, MD;  Location: [facility] ENDOSCOPY;  Service: Cardiovascular;  Laterality: N/A;  . CORONARY ANGIOPLASTY     2002-2003  . SHOULDER SURGERY    . TRANSTHORACIC ECHOCARDIOGRAM  05/29/2011   EF >70%, severe concentric LV hypertrophy, severe mitral annular calcification, mild-moderate aortic regurg,     Current Outpatient Medications  Medication Sig Dispense Refill  . apixaban (ELIQUIS) 5 MG TABS tablet Take 1 tablet (5 mg total) by mouth 2 (two) times daily. 180 tablet 1  . cholecalciferol (VITAMIN D) 1000 units tablet Take 1,000 Units by mouth daily.    . cyanocobalamin ([employer]S VITAMIN B12) 1000 MCG tablet Take 1 tablet (1,000 mcg total) by mouth daily.    Marland Kitchen dofetilide (TIKOSYN) 500 MCG capsule Take 1 capsule (500 mcg total) by mouth 2 (two) times daily. 60 capsule 6  . furosemide (LASIX) 40 MG tablet Take 1 tablet (40 mg total) by mouth daily. 30 tablet 6  . losartan (COZAAR) 50 MG tablet Take 50 mg by mouth daily.    Marland Kitchen omeprazole (PRILOSEC) 10 MG capsule Take 1 capsule (10 mg total) by  mouth daily. 90 capsule 3  . polyethylene glycol (MIRALAX / GLYCOLAX) packet Take 17 g by mouth as needed for mild constipation.     . rosuvastatin (CRESTOR) 10 MG tablet Take 1 tablet (10 mg total) by mouth daily. 90 tablet 3  . tamsulosin (FLOMAX) 0.4 MG CAPS capsule Take 1 capsule (0.4 mg total) by mouth daily. 90 capsule 3   No current facility-administered medications for this visit.    Allergies  Allergen Reactions  . Tetracycline Other (See Comments)    Issue with stomach lining - can take  with food if needed    Social History   Socioeconomic History  . Marital status: Widowed    Spouse name: Not on file  . Number of children: 5  . Years of education: Not on file  . Highest education level: Not on file  Occupational History  . 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 Smoking status: Former Smoker    Packs/day: 1.00    Years: 10.00    Pack years: 10.00    Types: Cigarettes    Quit date: 02/26/1985    Years since quitting: 35.0  . Smokeless tobacco: Never Used  Vaping Use  . Vaping Use: Never used  Substance and Sexual Activity  . Alcohol use: No  . Drug use: No  . Sexual activity: Not on file  Other Topics Concern  . Not on file  Social History Narrative  . Not on file   Social Determinants of Health   Financial Resource Strain: Not on file  Food Insecurity: Not on file  Transportation Needs: Not on file  Physical Activity: Not on file  Stress: Not on file  Social Connections: Not on file  Intimate Partner Violence: Not on file     Review of Systems: General: No chills, fever, night sweats or weight changes  Cardiovascular:  No chest pain, dyspnea on exertion, edema, orthopnea, palpitations, paroxysmal nocturnal dyspnea Dermatological: No rash, lesions or masses Respiratory: No cough, dyspnea Urologic: No hematuria, dysuria Abdominal: No nausea, vomiting, diarrhea, bright red blood per rectum, melena, or hematemesis Neurologic: No visual changes, weakness, changes in mental status All other systems reviewed and are otherwise negative except as noted above.  Physical Exam: Vitals:   02/29/20 1207  BP: 122/60  Pulse: 68  SpO2: 98%  Weight: 176 lb (79.8 kg)  Height: 5' 8.5" (1.74 m)    GEN- The patient is well appearing, alert and oriented x 3 today.   HEENT: normocephalic, atraumatic; sclera clear, conjunctiva pink; hearing intact; oropharynx clear; neck supple, no JVP Lymph- no cervical  lymphadenopathy Lungs- Clear to ausculation bilaterally, normal work of breathing.  No wheezes, rales, rhonchi Heart- Regular rate and rhythm, no murmurs, rubs or gallops, PMI not laterally displaced GI- soft, non-tender, non-distended, bowel sounds present, no hepatosplenomegaly Extremities- no clubbing, cyanosis, or edema; DP/PT/radial pulses 2+ bilaterally MS- no significant deformity or atrophy Skin- warm and dry, no rash or lesion Psych- euthymic mood, full affect Neuro- strength and sensation are intact  EKG is not ordered.   Additional studies reviewed include: Previous EP notes, Pulmonary notes  Assessment and Plan:  1. Persistent atrial fibrillation/ atrial flutter Regular on exam today.  Continue Tikosyn 500 BID ContinueEliquis for CHA2DS2VASC of at least 4.   K 4.8 01/29/2020   2. Chronic chronic diastolic CHF Volume status looks OK today.   Continue lasix 40 mg daily with extra as needed.  Labs today.  Encouraged fluid restriction to <2L daily, and sodium restriction.  Confounded by chronic venous stasis.   3. COPD with chronic hypoxic respiratory failure Continue O2. He has seen pulm. Scheduled for PFTs, and sleep study results pending.  RTC 2-3 months for continued tikosyn follow up.   Graciella Freer, PA-C  02/29/20 12:22 PM

## 2020-02-29 NOTE — Patient Instructions (Addendum)
Medication Instructions:  *If you need a refill on your cardiac medications before your next appointment, please call your pharmacy*  Lab Work: Your physician has recommended that you have lab work today: BMET and Magnesium Level If you have labs (blood work) drawn today and your tests are completely normal, you will receive your results only by: Marland Kitchen MyChart Message (if you have MyChart) OR . A paper copy in the mail If you have any lab test that is abnormal or we need to change your treatment, we will call you to review the results.  Follow-Up: At [facility], you and your health needs are our priority.  As part of our continuing mission to provide you with exceptional heart care, we have created designated Provider Care Teams.  These Care Teams include your primary Cardiologist (physician) and Advanced Practice Providers (APPs -  Physician Assistants and Nurse Practitioners) who all work together to provide you with the care you need, when you need it.  We recommend signing up for the patient portal called "MyChart".  Sign up information is provided on this After Visit Summary.  MyChart is used to connect with patients for Virtual Visits (Telemedicine).  Patients are able to view lab/test results, encounter notes, upcoming appointments, etc.  Non-urgent messages can be sent to your provider as well.   To learn more about what you can do with MyChart, go to [email or web address].    Your next appointment:   Your physician recommends that you schedule a follow-up appointment in: 2-3 MONTHS with Otilio Saber, PA-C -- Friday 04/29/20 at 11:20 am  The format for your next appointment:   In Person with Casimiro Needle "Mardelle Matte" Lanna Poche, PA-C

## 2020-04-01 ENCOUNTER — Other Ambulatory Visit (HOSPITAL_COMMUNITY)
Admission: RE | Admit: 2020-04-01 | Discharge: 2020-04-01 | Disposition: A | Discharge status: Home / Self Care | Payer: Medicare PPO | Source: Ambulatory Visit | Attending: Pulmonary Disease | Admitting: Pulmonary Disease

## 2020-04-01 DIAGNOSIS — Z20822 Contact with and (suspected) exposure to covid-19: Secondary | ICD-10-CM | POA: Insufficient documentation

## 2020-04-01 DIAGNOSIS — Z01812 Encounter for preprocedural laboratory examination: Secondary | ICD-10-CM | POA: Diagnosis not present

## 2020-04-01 LAB — SARS CORONAVIRUS 2 (TAT 6-24 HRS): SARS Coronavirus 2: NEGATIVE

## 2020-04-04 ENCOUNTER — Other Ambulatory Visit: Payer: Self-pay

## 2020-04-04 ENCOUNTER — Encounter: Payer: Self-pay | Admitting: Pulmonary Disease

## 2020-04-04 ENCOUNTER — Ambulatory Visit (INDEPENDENT_AMBULATORY_CARE_PROVIDER_SITE_OTHER): Payer: Medicare PPO | Admitting: Pulmonary Disease

## 2020-04-04 VITALS — BP 128/74 | HR 50 | Temp 98.0°F | Ht 67.0 in | Wt 179.0 lb

## 2020-04-04 DIAGNOSIS — J984 Other disorders of lung: Secondary | ICD-10-CM

## 2020-04-04 DIAGNOSIS — I272 Pulmonary hypertension, unspecified: Secondary | ICD-10-CM | POA: Diagnosis not present

## 2020-04-04 DIAGNOSIS — J9611 Chronic respiratory failure with hypoxia: Secondary | ICD-10-CM | POA: Diagnosis not present

## 2020-04-04 DIAGNOSIS — R942 Abnormal results of pulmonary function studies: Secondary | ICD-10-CM | POA: Diagnosis not present

## 2020-04-04 DIAGNOSIS — R69 Illness, unspecified: Secondary | ICD-10-CM | POA: Diagnosis not present

## 2020-04-04 LAB — PULMONARY FUNCTION TEST
DL/VA % pred: 91 %
DL/VA: 3.66 ml/min/mmHg/L
DLCO cor % pred: 48 %
DLCO cor: 10.98 ml/min/mmHg
DLCO unc % pred: 48 %
DLCO unc: 10.98 ml/min/mmHg
FEF 25-75 Post: 0.87 L/sec
FEF 25-75 Pre: 0.4 L/sec
FEF2575-%Change-Post: 119 %
FEF2575-%Pred-Post: 44 %
FEF2575-%Pred-Pre: 20 %
FEV1-%Change-Post: 38 %
FEV1-%Pred-Post: 41 %
FEV1-%Pred-Pre: 30 %
FEV1-Post: 1.13 L
FEV1-Pre: 0.82 L
FEV1FVC-%Change-Post: 39 %
FEV1FVC-%Pred-Pre: 75 %
FEV6-%Change-Post: 3 %
FEV6-%Pred-Post: 42 %
FEV6-%Pred-Pre: 40 %
FEV6-Post: 1.48 L
FEV6-Pre: 1.43 L
FEV6FVC-%Change-Post: 4 %
FEV6FVC-%Pred-Post: 107 %
FEV6FVC-%Pred-Pre: 102 %
FVC-%Change-Post: 0 %
FVC-%Pred-Post: 39 %
FVC-%Pred-Pre: 39 %
FVC-Post: 1.48 L
FVC-Pre: 1.49 L
Post FEV1/FVC ratio: 76 %
Post FEV6/FVC ratio: 100 %
Pre FEV1/FVC ratio: 55 %
Pre FEV6/FVC Ratio: 96 %
RV % pred: 110 %
RV: 2.64 L
TLC % pred: 62 %
TLC: 4.02 L

## 2020-04-04 NOTE — Addendum Note (Signed)
Addended by: Freda Jackson on: 04/04/2020 03:00 PM   Modules accepted: Orders

## 2020-04-04 NOTE — Progress Notes (Signed)
Synopsis: Referred by Shirley Friar, PA for respiratory failure  Subjective:   PATIENT ID: Joshua [name] GENDER: male DOB: Jun 03, 1945, MRN: [id number]  Using saline gel and nasal spray to help moisturize his nose. Complains of nasal congestion due to the oxygen.   HPI  Chief Complaint  Patient presents with  . Follow-up   Shirley Bolle is a 75 year old male, former smoker with atrial fibrillation, HOCM, coronary artery disease, chronic hypoxemic respiratory failure and chronic pleural parenchymal scarring with small bilateral chronic fibrothoraces who returns to pulmonary clinic for followup.  He has been doing well since last visit. He has exertional shortness of breath. He has been on 3L of oxygen. He had a sleep study performed which indicated nocturnal hypoxemia as well but not obstructive sleep apnea. The study recommended he be on 3L of oxygen at night.   He complains of runny nose, nasal crusting and irritation with being on the oxygen 24/7 now. He is using ayr saline gel and saline nasal spray daily.   He had PFTs today which show FEV1/FVC ratio 75, FEV1 1.13L (41%), FVC 1.48L (39%), TLC 4.02L (62%), DLCO 48%.    PFTs from 2012 show FEV1/FVC ratio of 73, FEV1 1.23L (41%) and FVC 1.68L (38%). TLC 3.94L (62%) and DLCO 42% consistent with a mild restrictive defect and moderate diffusion defect.   On chest imaging review, he had a left pleural effusion as early as 2006 and underwent a thoracentesis at that time. I am not able to see the pleural fluid studies but the cytology from that sample was negative for malignant cells. In 2008 he was noted to have bilateral pleural effusions at that time. He appears to have had a complicated history involving both pleural spaces that developed a fibrotic component as noted from a CT chest with contrast in 2012 and again on CT abdomen of 2016 with some rounded atelectasis of the right lower lobe.       Past Medical History:  Diagnosis  Date  . Acute lower GI bleeding    related to gastritis / viral infection  . Cataract   . Cirrhosis ([place])   . COPD (chronic obstructive pulmonary disease) ([place])    PFT 05-05-2010, FEV1 .9 (26%) ratio 60  . Coronary artery disease   . Gastritis 06-10-2006  . Hiatal hernia 06-10-2006   EGD  . HOH (hard of hearing)   . Hx of adenomatous colonic polyps 2005, 2013   Colonoscopy  . Hypercholesteremia   . Hypertension   . Hypertr obst cardiomyop    mild subaortic stenosis  . Iron deficiency anemia, unspecified   . LVH (left ventricular hypertrophy)   . Other B-complex deficiencies   . Persistent atrial fibrillation (HCC)    persistent  . Rheumatic fever   . Thrombocytopenia ([place]) 05/2008     Family History  Problem Relation Age of Onset  . Heart attack Brother 61       Ventircular rupture  . Emphysema Brother   . Lung cancer Brother   . Heart disease Mother        Enlarged heart  . Stroke Mother   . Hyperlipidemia Father   . CAD Father   . Hypertension Sister   . CAD Paternal Uncle   . Stroke Paternal Uncle   . Cancer Maternal Grandfather        colon  . Colon cancer Maternal Grandfather   . Asthma Brother      Social History  Socioeconomic History  . Marital status: Widowed    Spouse name: Not on file  . Number of children: 5  . Years of education: Not on file  . Highest education level: Not on file  Occupational History  . 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 Smoking status: Former Smoker    Packs/day: 1.00    Years: 10.00    Pack years: 10.00    Types: Cigarettes    Quit date: 02/26/1985    Years since quitting: 35.1  . Smokeless tobacco: Never Used  Vaping Use  . Vaping Use: Never used  Substance and Sexual Activity  . Alcohol use: No  . Drug use: No  . Sexual activity: Not on file  Other Topics Concern  . Not on file  Social History Narrative  . Not on file   Social Determinants of Health   Financial Resource  Strain: Not on file  Food Insecurity: Not on file  Transportation Needs: Not on file  Physical Activity: Not on file  Stress: Not on file  Social Connections: Not on file  Intimate Partner Violence: Not on file     Allergies  Allergen Reactions  . Tetracycline Other (See Comments)    Issue with stomach lining - can take with food if needed     Outpatient Medications Prior to Visit  Medication Sig Dispense Refill  . apixaban (ELIQUIS) 5 MG TABS tablet Take 1 tablet (5 mg total) by mouth 2 (two) times daily. 180 tablet 1  . cholecalciferol (VITAMIN D) 1000 units tablet Take 1,000 Units by mouth daily.    . cyanocobalamin ([employer]S VITAMIN B12) 1000 MCG tablet Take 1 tablet (1,000 mcg total) by mouth daily.    Marland Kitchen dofetilide (TIKOSYN) 500 MCG capsule Take 1 capsule (500 mcg total) by mouth 2 (two) times daily. 60 capsule 6  . furosemide (LASIX) 40 MG tablet Take 1 tablet (40 mg total) by mouth daily. 30 tablet 6  . losartan (COZAAR) 50 MG tablet Take 50 mg by mouth daily.    Marland Kitchen omeprazole (PRILOSEC) 10 MG capsule Take 1 capsule (10 mg total) by mouth daily. 90 capsule 3  . polyethylene glycol (MIRALAX / GLYCOLAX) packet Take 17 g by mouth as needed for mild constipation.     . rosuvastatin (CRESTOR) 10 MG tablet Take 1 tablet (10 mg total) by mouth daily. 90 tablet 3  . tamsulosin (FLOMAX) 0.4 MG CAPS capsule Take 1 capsule (0.4 mg total) by mouth daily. 90 capsule 3   No facility-administered medications prior to visit.    Review of Systems  Constitutional: Negative for chills, fever, malaise/fatigue and weight loss.  HENT: Negative for congestion, sinus pain and sore throat.   Eyes: Negative.   Respiratory: Positive for shortness of breath. Negative for cough, hemoptysis, sputum production and wheezing.   Cardiovascular: Negative for chest pain, palpitations, orthopnea, claudication, leg swelling and PND.  Gastrointestinal: Negative for abdominal pain, heartburn, nausea and vomiting.   Genitourinary: Negative.   Musculoskeletal: Negative.   Skin: Negative.   Neurological: Negative for dizziness, weakness and headaches.  Endo/Heme/Allergies: Negative.   Psychiatric/Behavioral: Negative.     Objective:   Vitals:   04/04/20 1204  BP: 128/74  Pulse: (!) 50  Temp: 98 F (36.7 C)  TempSrc: Oral  SpO2: 98%  Weight: 179 lb (81.2 kg)  Height: _0  (1.702 m)    Physical Exam Constitutional:      General: He is not  in acute distress.    Appearance: He is normal weight.  HENT:     Head: Normocephalic and atraumatic.  Eyes:     General: No scleral icterus.    Conjunctiva/sclera: Conjunctivae normal.     Pupils: Pupils are equal, round, and reactive to light.  Cardiovascular:     Rate and Rhythm: Normal rate and regular rhythm.     Pulses: Normal pulses.     Heart sounds: Murmur (systolic) heard.    Pulmonary:     Effort: Pulmonary effort is normal.     Breath sounds: Decreased air movement present. Rales (mild, at bases) present. No wheezing or rhonchi.  Abdominal:     General: Bowel sounds are normal.     Palpations: Abdomen is soft.  Musculoskeletal:     Right lower leg: No edema.     Left lower leg: No edema.  Skin:    General: Skin is warm and dry.  Neurological:     General: No focal deficit present.     Mental Status: He is alert.  Psychiatric:        Mood and Affect: Mood normal.        Behavior: Behavior normal.        Thought Content: Thought content normal.        Judgment: Judgment normal.     CBC    Component Value Date/Time   WBC 5.6 06/12/2019 1211   WBC 6.6 01/16/2015 0312   RBC 4.80 06/12/2019 1211   RBC 4.33 01/16/2015 0312   HGB 18.4 (H) 10/20/2019 1138   HGB 15.5 06/12/2019 1211   HCT 54.0 (H) 10/20/2019 1138   HCT 45.8 06/12/2019 1211   PLT 127 (L) 06/12/2019 1211   MCV 95 06/12/2019 1211   MCH 32.3 06/12/2019 1211   MCH 31.4 01/16/2015 0312   MCHC 33.8 06/12/2019 1211   MCHC 32.7 01/16/2015 0312   RDW 12.4  06/12/2019 1211   LYMPHSABS 1.0 03/11/2019 0849   MONOABS 0.1 03/31/2010 1739   EOSABS 0.2 03/11/2019 0849   BASOSABS 0.1 03/11/2019 0849   BMP Latest Ref Rng & Units 02/29/2020 01/29/2020 01/20/2020  Glucose 65 - 99 mg/dL 105(H) 105(H) 86  BUN 8 - 27 mg/dL 26 27 29(H)  Creatinine 0.76 - 1.27 mg/dL 0.92 0.94 0.86  BUN/Creat Ratio 10 - 24 28(H) 29(H) -  Sodium 134 - 144 mmol/L 142 142 141  Potassium 3.5 - 5.2 mmol/L 4.8 4.8 5.5(H)  Chloride 96 - 106 mmol/L 96 95(L) 93(L)  CO2 20 - 29 mmol/L 40(H) 41(HH) 43(H)  Calcium 8.6 - 10.2 mg/dL 8.8 9.0 9.1   Chest imaging: Reviewed as per HPI  PFT:  2012 FEV1/FVC 73 FVC 38 % FEV1 41% TLC 62% DLCO 42%  2021 FEV1/FVC 76 FVC 39% FEV1 41% TLC 62% DLCO 48%  Echo: 10/08/19 1. The severe MAC projects into the LVOT, causing flow turbulence.  However, no significant LVOT gradient noted.. Left ventricular ejection  fraction, by estimation, is 60 to 65%. The left ventricle has normal  function. The left ventricle has no regional  wall motion abnormalities. There is severe concentric left ventricular  hypertrophy. Left ventricular diastolic function could not be evaluated.  2. Right ventricular systolic function is low normal. The right  ventricular size is mildly enlarged. There is mildly elevated pulmonary  artery systolic pressure.  3. Left atrial size was moderately dilated.  4. Right atrial size was moderately dilated.  5. The mitral valve is degenerative. Mild mitral  valve regurgitation.  Mild mitral stenosis. The mean mitral valve gradient is 7.0 mmHg.  6. The aortic valve is tricuspid. Aortic valve regurgitation is moderate.  7. The inferior vena cava is normal in size with greater than 50%  respiratory variability, suggesting right atrial pressure of 3 mmHg.    Sleep Study 02/24/20 IMPRESSIONS - No significant obstructive sleep apnea occurred during this study (AHI = 1.4/h). - No significant central sleep apnea occurred  during this study (CAI = 0.0/h). - He had significant oxygen desaturation in the abscence of other respiratory events.  This improved after the addition of 3 liters oxygen.  Assessment & Plan:   Restrictive lung disease  Chronic hypoxemic respiratory failure (HCC)  Decreased diffusion capacity  Pulmonary hypertension (HCC)  Discussion: Joshua [name] is a 75 year old male, former smoker with atrial fibrillation, HOCM, coronary artery disease, and chronic pleural parenchymal scarring with small bilateral chronic fibrothoraces who is referred to pulmonary clinic for respiratory failure.  His dyspnea is multifactorial in the setting of complicated history of pleural effusions that appear to have developed into chronic fibrothoraces leading to restrictive lung disease. He also has diffuse parenchymal scarring which contributes to his diffusion defect. He also has a component of pulmonary hypertension in setting of chronic lung disease and multivalvular heart disease.   He is to remain on supplemental oxygen and we stressed the importance of wearing it at all times. He is lasix per cardiology which will help with the degree of pulmonary hypertension.   We will send an order to the [place] for a portable oxygen concentrator.    Follow up in 6 months.  Freda Jackson, MD [facility] Office: [phone]   See Amion for Pager Details     Current Outpatient Medications:  .  apixaban (ELIQUIS) 5 MG TABS tablet, Take 1 tablet (5 mg total) by mouth 2 (two) times daily., Disp: 180 tablet, Rfl: 1 .  cholecalciferol (VITAMIN D) 1000 units tablet, Take 1,000 Units by mouth daily., Disp: , Rfl:  .  cyanocobalamin ([employer] VITAMIN B12) 1000 MCG tablet, Take 1 tablet (1,000 mcg total) by mouth daily., Disp: , Rfl:  .  dofetilide (TIKOSYN) 500 MCG capsule, Take 1 capsule (500 mcg total) by mouth 2 (two) times daily., Disp: 60 capsule, Rfl: 6 .  furosemide (LASIX) 40 MG tablet, Take 1  tablet (40 mg total) by mouth daily., Disp: 30 tablet, Rfl: 6 .  losartan (COZAAR) 50 MG tablet, Take 50 mg by mouth daily., Disp: , Rfl:  .  omeprazole (PRILOSEC) 10 MG capsule, Take 1 capsule (10 mg total) by mouth daily., Disp: 90 capsule, Rfl: 3 .  polyethylene glycol (MIRALAX / GLYCOLAX) packet, Take 17 g by mouth as needed for mild constipation. , Disp: , Rfl:  .  rosuvastatin (CRESTOR) 10 MG tablet, Take 1 tablet (10 mg total) by mouth daily., Disp: 90 tablet, Rfl: 3 .  tamsulosin (FLOMAX) 0.4 MG CAPS capsule, Take 1 capsule (0.4 mg total) by mouth daily., Disp: 90 capsule, Rfl: 3

## 2020-04-04 NOTE — Patient Instructions (Addendum)
We will send an order for a portable oxygen concentrator to the [place].

## 2020-04-04 NOTE — Progress Notes (Signed)
PFT done today. 

## 2020-04-06 ENCOUNTER — Ambulatory Visit: Payer: Medicare PPO | Admitting: Pulmonary Disease

## 2020-04-08 ENCOUNTER — Ambulatory Visit: Payer: Medicare PPO

## 2020-04-08 ENCOUNTER — Other Ambulatory Visit: Payer: Self-pay

## 2020-04-08 ENCOUNTER — Encounter: Payer: Self-pay | Admitting: *Deleted

## 2020-04-08 DIAGNOSIS — J9611 Chronic respiratory failure with hypoxia: Secondary | ICD-10-CM

## 2020-04-22 ENCOUNTER — Telehealth: Payer: Self-pay | Admitting: Pulmonary Disease

## 2020-04-22 DIAGNOSIS — R69 Illness, unspecified: Secondary | ICD-10-CM | POA: Diagnosis not present

## 2020-04-22 NOTE — Telephone Encounter (Signed)
Called and spoke with Eual Fines and I did verify that the pt is currently using oxygen.

## 2020-04-26 DIAGNOSIS — R69 Illness, unspecified: Secondary | ICD-10-CM | POA: Diagnosis not present

## 2020-04-29 ENCOUNTER — Ambulatory Visit: Payer: Medicare PPO | Admitting: Student

## 2020-04-29 ENCOUNTER — Other Ambulatory Visit: Payer: Self-pay

## 2020-04-29 ENCOUNTER — Encounter: Payer: Self-pay | Admitting: Student

## 2020-04-29 VITALS — BP 122/60 | HR 64 | Ht 67.0 in | Wt 174.0 lb

## 2020-04-29 DIAGNOSIS — I2510 Atherosclerotic heart disease of native coronary artery without angina pectoris: Secondary | ICD-10-CM | POA: Diagnosis not present

## 2020-04-29 DIAGNOSIS — I4892 Unspecified atrial flutter: Secondary | ICD-10-CM

## 2020-04-29 DIAGNOSIS — I5032 Chronic diastolic (congestive) heart failure: Secondary | ICD-10-CM | POA: Diagnosis not present

## 2020-04-29 DIAGNOSIS — J449 Chronic obstructive pulmonary disease, unspecified: Secondary | ICD-10-CM

## 2020-04-29 NOTE — Patient Instructions (Signed)
Medication Instructions:  Your physician recommends that you continue on your current medications as directed. Please refer to the Current Medication list given to you today.  *If you need a refill on your cardiac medications before your next appointment, please call your pharmacy*   Lab Work: BMET, CBC on 05/05/2020  If you have labs (blood work) drawn today and your tests are completely normal, you will receive your results only by: Marland Kitchen MyChart Message (if you have MyChart) OR . A paper copy in the mail If you have any lab test that is abnormal or we need to change your treatment, we will call you to review the results.   Follow-Up: At [facility], you and your health needs are our priority.  As part of our continuing mission to provide you with exceptional heart care, we have created designated Provider Care Teams.  These Care Teams include your primary Cardiologist (physician) and Advanced Practice Providers (APPs -  Physician Assistants and Nurse Practitioners) who all work together to provide you with the care you need, when you need it.   Your next appointment:   You will f/u 7-10 days at the [facility] after your Cardioversion.  Other Instructions Due to recent COVID-19 restrictions implemented by our local and state authorities and in an effort to keep both patients and staff as safe as possible, our hospital system requires COVID-19 testing prior to certain scheduled hospital procedures.  Please go to [address] [place]. [place], [place] [address] on 05/05/2020 at 9:50AM  .  This is a drive up testing site.  You will not need to exit your vehicle.  You will not be billed at the time of testing but may receive a bill later depending on your insurance. You must agree to self-quarantine from the time of your testing until the procedure date on 05/09/2020.  This should included staying home with ONLY the people you live with.  Avoid take-out, grocery store shopping or leaving the house for  any non-emergent reason.  Failure to have your COVID-19 test done on the date and time you have been scheduled will result in cancellation of your procedure.  Please call our office at [phone] if you have any questions.

## 2020-04-29 NOTE — Progress Notes (Signed)
PCP:  [name], [name], MD Primary Cardiologist: Shelva Majestic, MD Electrophysiologist: Thompson Grayer, MD   Joshua [name] is a 75 y.o. male seen today for Thompson Grayer, MD for routine electrophysiology followup.  Since last being seen in our clinic the patient reports doing well. He was able to work in his yard yesterday for the first time in quiet awhile. He mowed (riding) and managed the leaves. He's trying to figure out how to use his O2 and Leaf blower simultaneously. He denies CP, undue SOB, lightheadedness, dizziness, syncope, or near syncope. He has not missed Eliquis or Tikosyn.   Past Medical History:  Diagnosis Date  . Acute lower GI bleeding    related to gastritis / viral infection  . Cataract   . Cirrhosis ([place])   . COPD (chronic obstructive pulmonary disease) ([place])    PFT 05-05-2010, FEV1 .9 (26%) ratio 60  . Coronary artery disease   . Gastritis 06-10-2006  . Hiatal hernia 06-10-2006   EGD  . HOH (hard of hearing)   . Hx of adenomatous colonic polyps 2005, 2013   Colonoscopy  . Hypercholesteremia   . Hypertension   . Hypertr obst cardiomyop    mild subaortic stenosis  . Iron deficiency anemia, unspecified   . LVH (left ventricular hypertrophy)   . Other B-complex deficiencies   . Persistent atrial fibrillation (HCC)    persistent  . Rheumatic fever   . Thrombocytopenia ([place]) 05/2008   Past Surgical History:  Procedure Laterality Date  . ATRIAL FLUTTER ABLATION  06/2010   CTI ablation performed by [name]  . CARDIAC CATHETERIZATION  05/28/2005   Widely patent coronaries and normal LV function.  Marland Kitchen CARDIAC CATHETERIZATION  08/11/2001   80% LAD stenosis successfully stented with a 3.5x67mm Cypher DES postdilated to 3.66mm resulting in reduction of 80% to 0%.  . CARDIAC CATHETERIZATION  01/15/2001   Focal 95% proximal RCA stenosis, stented with a 3x76mm Zeta multilink stent with postdilatation utilizing a 3.5x43mm Quantum balloon with stenosis being reduced to 0%.   . CARDIOVASCULAR STRESS TEST  05/29/2011   No scintigraphic evidence of inducible myocardial ischemia. No ECG changes. EKG negative for ischemia.  Marland Kitchen CARDIOVERSION N/A 10/20/2019   Procedure: CARDIOVERSION;  Surgeon: Sanda Klein, MD;  Location: [facility] ENDOSCOPY;  Service: Cardiovascular;  Laterality: N/A;  . CARDIOVERSION N/A 01/14/2020   Procedure: CARDIOVERSION;  Surgeon: Buford Dresser, MD;  Location: [facility] ENDOSCOPY;  Service: Cardiovascular;  Laterality: N/A;  . CORONARY ANGIOPLASTY     2002-2003  . SHOULDER SURGERY    . TRANSTHORACIC ECHOCARDIOGRAM  05/29/2011   EF >70%, severe concentric LV hypertrophy, severe mitral annular calcification, mild-moderate aortic regurg,     Current Outpatient Medications  Medication Sig Dispense Refill  . apixaban (ELIQUIS) 5 MG TABS tablet Take 1 tablet (5 mg total) by mouth 2 (two) times daily. 180 tablet 1  . cholecalciferol (VITAMIN D) 1000 units tablet Take 1,000 Units by mouth daily.    . cyanocobalamin (CVS VITAMIN B12) 1000 MCG tablet Take 1 tablet (1,000 mcg total) by mouth daily.    Marland Kitchen dofetilide (TIKOSYN) 500 MCG capsule Take 1 capsule (500 mcg total) by mouth 2 (two) times daily. 60 capsule 6  . furosemide (LASIX) 40 MG tablet Take 1 tablet (40 mg total) by mouth daily. 30 tablet 6  . losartan (COZAAR) 50 MG tablet Take 50 mg by mouth daily.    Marland Kitchen omeprazole (PRILOSEC) 10 MG capsule Take 1 capsule (10 mg total) by mouth  daily. 90 capsule 3  . polyethylene glycol (MIRALAX / GLYCOLAX) packet Take 17 g by mouth as needed for mild constipation.     . potassium chloride SA (KLOR-CON) 20 MEQ tablet as directed. Only if take 60mg     . rosuvastatin (CRESTOR) 10 MG tablet Take 1 tablet (10 mg total) by mouth daily. 90 tablet 3  . tamsulosin (FLOMAX) 0.4 MG CAPS capsule Take 1 capsule (0.4 mg total) by mouth daily. 90 capsule 3   No current facility-administered medications for this visit.    Allergies  Allergen Reactions  . Tetracycline Other  (See Comments)    Issue with stomach lining - can take with food if needed    Social History   Socioeconomic History  . Marital status: Widowed    Spouse name: Not on file  . Number of children: 5  . Years of education: Not on file  . Highest education level: Not on file  Occupational History  . 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 Smoking status: Former Smoker    Packs/day: 1.00    Years: 10.00    Pack years: 10.00    Types: Cigarettes    Quit date: 02/26/1985    Years since quitting: 35.1  . Smokeless tobacco: Never Used  Vaping Use  . Vaping Use: Never used  Substance and Sexual Activity  . Alcohol use: No  . Drug use: No  . Sexual activity: Not on file  Other Topics Concern  . Not on file  Social History Narrative  . Not on file   Social Determinants of Health   Financial Resource Strain: Not on file  Food Insecurity: Not on file  Transportation Needs: Not on file  Physical Activity: Not on file  Stress: Not on file  Social Connections: Not on file  Intimate Partner Violence: Not on file     Review of Systems: General: No chills, fever, night sweats or weight changes  Cardiovascular:  No chest pain, dyspnea on exertion, edema, orthopnea, palpitations, paroxysmal nocturnal dyspnea Dermatological: No rash, lesions or masses Respiratory: No cough, dyspnea Urologic: No hematuria, dysuria Abdominal: No nausea, vomiting, diarrhea, bright red blood per rectum, melena, or hematemesis Neurologic: No visual changes, weakness, changes in mental status All other systems reviewed and are otherwise negative except as noted above.  Physical Exam: Vitals:   04/29/20 1119  BP: 122/60  Pulse: 64  SpO2: 90%  Weight: 174 lb (78.9 kg)  Height: 5\' 7"  (1.702 m)    GEN- The patient is well appearing, alert and oriented x 3 today.   HEENT: normocephalic, atraumatic; sclera clear, conjunctiva pink; hearing intact; oropharynx clear; neck  supple, no JVP Lymph- no cervical lymphadenopathy Lungs- Clear to ausculation bilaterally, normal work of breathing.  No wheezes, rales, rhonchi Heart- Regular rate and rhythm, no murmurs, rubs or gallops, PMI not laterally displaced GI- soft, non-tender, non-distended, bowel sounds present, no hepatosplenomegaly Extremities- no clubbing, cyanosis, or edema; DP/PT/radial pulses 2+ bilaterally MS- no significant deformity or atrophy Skin- warm and dry, no rash or lesion Psych- euthymic mood, full affect Neuro- strength and sensation are intact  EKG is ordered. Personal review of EKG from today shows atypical appearing atrial flutter with controlled ventricular rates at 62 bpm  Additional studies reviewed include: Previous EP notes  Assessment and Plan:  1. Persistent atrialfibrillation/ atypical atrial flutter EKG shows back in flutter.    Continue Tikosyn 500 BID ContinueEliquisforCHA2DS2VASC ofat least 4. BMET, CBC,  and Mg for pre-cardioversion  2.Chronicchronic diastolic CHF Volume status stable on exam.  Continue lasix 40 mg daily with extra as needed.  BMET and Mg today. Encouraged fluid restriction to <2L daily, and sodium restriction.  Confounded by chronic venous stasis.  3.COPD with chronichypoxic respiratory failure Continue O2. Now following with pulmonary. PFTs with restrictive lung disease He has seen pulm. Scheduled for PFTs, and sleep study results pending.  Complicated case. He currently has NYHA II symptoms but is back in atrial flutter, unclear time frame as he does not have cardiac awareness or device to monitor. Overall his DOE has improved as is pulmonary issues have been managed. In shared decision making today we will attempt NSR at least once more prior to turning to rate control alone. He is a poor candidate for amiodarone given his significant lung disease.   He will need close AF clinic follow up post [facility] to eval and discuss further.    Shirley Friar, PA-C  04/29/20 11:27 AM

## 2020-05-05 ENCOUNTER — Other Ambulatory Visit (HOSPITAL_COMMUNITY)
Admission: RE | Admit: 2020-05-05 | Discharge: 2020-05-05 | Disposition: A | Discharge status: Home / Self Care | Payer: Medicare PPO | Source: Ambulatory Visit | Attending: Cardiology | Admitting: Cardiology

## 2020-05-05 ENCOUNTER — Other Ambulatory Visit: Payer: Medicare PPO | Admitting: *Deleted

## 2020-05-05 ENCOUNTER — Other Ambulatory Visit: Payer: Self-pay

## 2020-05-05 DIAGNOSIS — J449 Chronic obstructive pulmonary disease, unspecified: Secondary | ICD-10-CM | POA: Diagnosis not present

## 2020-05-05 DIAGNOSIS — I4892 Unspecified atrial flutter: Secondary | ICD-10-CM

## 2020-05-05 DIAGNOSIS — Z01812 Encounter for preprocedural laboratory examination: Secondary | ICD-10-CM | POA: Diagnosis not present

## 2020-05-05 DIAGNOSIS — I5032 Chronic diastolic (congestive) heart failure: Secondary | ICD-10-CM | POA: Diagnosis not present

## 2020-05-05 DIAGNOSIS — Z20822 Contact with and (suspected) exposure to covid-19: Secondary | ICD-10-CM | POA: Insufficient documentation

## 2020-05-05 DIAGNOSIS — I2510 Atherosclerotic heart disease of native coronary artery without angina pectoris: Secondary | ICD-10-CM

## 2020-05-05 LAB — SARS CORONAVIRUS 2 (TAT 6-24 HRS): SARS Coronavirus 2: NEGATIVE

## 2020-05-06 LAB — BASIC METABOLIC PANEL
BUN/Creatinine Ratio: 21 (ref 10–24)
BUN: 19 mg/dL (ref 8–27)
CO2: 32 mmol/L — ABNORMAL HIGH (ref 20–29)
Calcium: 9 mg/dL (ref 8.6–10.2)
Chloride: 99 mmol/L (ref 96–106)
Creatinine, Ser: 0.92 mg/dL (ref 0.76–1.27)
Glucose: 94 mg/dL (ref 65–99)
Potassium: 4.5 mmol/L (ref 3.5–5.2)
Sodium: 143 mmol/L (ref 134–144)
eGFR: 87 mL/min/{1.73_m2} (ref >59)

## 2020-05-06 LAB — CBC
Hematocrit: 42.9 % (ref 37.5–51.0)
Hemoglobin: 14 g/dL (ref 13.0–17.7)
MCH: 31.7 pg (ref 26.6–33.0)
MCHC: 32.6 g/dL (ref 31.5–35.7)
MCV: 97 fL (ref 79–97)
Platelets: 96 10*3/uL — CL (ref 150–450)
RBC: 4.41 x10E6/uL (ref 4.14–5.80)
RDW: 11.7 % (ref 11.6–15.4)
WBC: 4.6 10*3/uL (ref 3.4–10.8)

## 2020-05-09 ENCOUNTER — Ambulatory Visit (HOSPITAL_COMMUNITY)
Admission: RE | Admit: 2020-05-09 | Discharge: 2020-05-09 | Disposition: A | Discharge status: Home / Self Care | Payer: Medicare PPO | Attending: Cardiology | Admitting: Cardiology

## 2020-05-09 ENCOUNTER — Encounter (HOSPITAL_COMMUNITY)
Admission: RE | Disposition: A | Discharge status: Home / Self Care | Payer: Self-pay | Source: Home / Self Care | Attending: Cardiology

## 2020-05-09 DIAGNOSIS — Z5309 Procedure and treatment not carried out because of other contraindication: Secondary | ICD-10-CM | POA: Diagnosis not present

## 2020-05-09 DIAGNOSIS — I480 Paroxysmal atrial fibrillation: Secondary | ICD-10-CM

## 2020-05-09 DIAGNOSIS — I4891 Unspecified atrial fibrillation: Secondary | ICD-10-CM | POA: Insufficient documentation

## 2020-05-09 SURGERY — CANCELLED PROCEDURE

## 2020-05-09 NOTE — Progress Notes (Signed)
Patient in NSR on arrival for cardioversion.  Verified with EKG.  Patient discharged home with family.  Vista Lawman, RN

## 2020-05-09 NOTE — H&P (Signed)
   In normal sinus rhythm.  Cancelled cardioversion  Candee Furbish, MD

## 2020-05-19 ENCOUNTER — Encounter (HOSPITAL_COMMUNITY): Payer: Self-pay | Admitting: Nurse Practitioner

## 2020-05-19 ENCOUNTER — Other Ambulatory Visit: Payer: Self-pay

## 2020-05-19 ENCOUNTER — Ambulatory Visit (HOSPITAL_COMMUNITY)
Admission: RE | Admit: 2020-05-19 | Discharge: 2020-05-19 | Disposition: A | Discharge status: Home / Self Care | Payer: Medicare PPO | Source: Ambulatory Visit | Attending: Nurse Practitioner | Admitting: Nurse Practitioner

## 2020-05-19 VITALS — BP 128/50 | HR 51 | Ht 67.0 in | Wt 175.2 lb

## 2020-05-19 DIAGNOSIS — Z79899 Other long term (current) drug therapy: Secondary | ICD-10-CM | POA: Diagnosis not present

## 2020-05-19 DIAGNOSIS — I421 Obstructive hypertrophic cardiomyopathy: Secondary | ICD-10-CM | POA: Insufficient documentation

## 2020-05-19 DIAGNOSIS — I4892 Unspecified atrial flutter: Secondary | ICD-10-CM

## 2020-05-19 DIAGNOSIS — Z7901 Long term (current) use of anticoagulants: Secondary | ICD-10-CM | POA: Insufficient documentation

## 2020-05-19 DIAGNOSIS — D6869 Other thrombophilia: Secondary | ICD-10-CM | POA: Diagnosis not present

## 2020-05-19 DIAGNOSIS — I2510 Atherosclerotic heart disease of native coronary artery without angina pectoris: Secondary | ICD-10-CM | POA: Insufficient documentation

## 2020-05-19 NOTE — Progress Notes (Signed)
Primary Care Physician: [name], Joshua Kaufmann, MD Referring Physician: Dr. Rayann [name] Cardiologist: Dr. Julieta [name] Joshua [name] is a 75 y.o. male with a h/o CAD, HOCM, and atrial flutter, prior ablation 2012,  on norpace  for several years. He  saw Dr. Rayann [name] in August  for options of restoring SR  as he had returned to atrial flutter. Ablation, change in antiarrythmic (Tikosyn vrs amiodarone) were discussed with the pt as well as the option of staying on Norpace  and trying a cardioversion. He chose cardioversion. He did convert but unfortunately returned to atrial flutter. Dr. Rayann [name] did not think he would be an ideal candidate for ablation so antiarrythmic's were discussed. He chose Joshua [name].  He is  back in afib clinic, 11/16,  for [name] admission now that covid bed restrictions have been lifted. Has been off norpace  and HCTZ for over one week. He switched to eliquis from coumadin 3-4 weeks ago, no missed anticoagulation. Will be getting tikosyn thru the [name]. Qtc stable. He feels he may need a few dose of lasix in the hospital for LLE.   F/u in afib clinic, 11/23. He is here one week f/u hospitalization for Tikosyn load. He is in SR  but does not feel any different. He is taking Tikosyn at 7:30 am and 7:30 pm.   F/u in afib clinic 3/24. This is a f/u cardioversion appointment set up by Joshua [name], but when pt presented to the CV, he was in SR. He continues in SR today. He feels no different in SR than when he was out of rhythm with Joshua [name], 3/4.   he denies symptoms of palpitations, chest pain, shortness of breath, orthopnea, PND, lower extremity edema, dizziness, presyncope, syncope, or neurologic sequela. The patient is tolerating medications without difficulties and is otherwise without complaint today.   Past Medical History:  Diagnosis Date  . Acute lower GI bleeding    related to gastritis / viral infection  . Cataract   . Cirrhosis ([place])   . COPD (chronic obstructive pulmonary  disease) ([place])    PFT 05-05-2010, FEV1 .9 (26%) ratio 60  . Coronary artery disease   . Gastritis 06-10-2006  . Hiatal hernia 06-10-2006   EGD  . HOH (hard of hearing)   . Hx of adenomatous colonic polyps 2005, 2013   Colonoscopy  . Hypercholesteremia   . Hypertension   . Hypertr obst cardiomyop    mild subaortic stenosis  . Iron deficiency anemia, unspecified   . LVH (left ventricular hypertrophy)   . Other B-complex deficiencies   . Persistent atrial fibrillation (HCC)    persistent  . Rheumatic fever   . Thrombocytopenia ([place]) 05/2008   Past Surgical History:  Procedure Laterality Date  . ATRIAL FLUTTER ABLATION  06/2010   CTI ablation performed by Dr. Rayann [name]  . CARDIAC CATHETERIZATION  05/28/2005   Widely patent coronaries and normal LV function.  Marland Kitchen CARDIAC CATHETERIZATION  08/11/2001   80% LAD stenosis successfully stented with a 3.5x1mm Cypher DES postdilated to 3.3mm resulting in reduction of 80% to 0%.  . CARDIAC CATHETERIZATION  01/15/2001   Focal 95% proximal RCA stenosis, stented with a 3x6mm Zeta multilink stent with postdilatation utilizing a 3.5x25mm Quantum balloon with stenosis being reduced to 0%.  . CARDIOVASCULAR STRESS TEST  05/29/2011   No scintigraphic evidence of inducible myocardial ischemia. No ECG changes. EKG negative for ischemia.  Marland Kitchen CARDIOVERSION N/A 10/20/2019   Procedure: CARDIOVERSION;  Surgeon: Joshua Klein, MD;  Location: [facility];  Service: Cardiovascular;  Laterality: N/A;  . CARDIOVERSION N/A 01/14/2020   Procedure: CARDIOVERSION;  Surgeon: Joshua Dresser, MD;  Location: [facility] ENDOSCOPY;  Service: Cardiovascular;  Laterality: N/A;  . CORONARY ANGIOPLASTY     2002-2003  . SHOULDER SURGERY    . TRANSTHORACIC ECHOCARDIOGRAM  05/29/2011   EF >70%, severe concentric LV hypertrophy, severe mitral annular calcification, mild-moderate aortic regurg,     Current Outpatient Medications  Medication Sig Dispense Refill  . apixaban (ELIQUIS) 5 MG  TABS tablet Take 1 tablet (5 mg total) by mouth 2 (two) times daily. 180 tablet 1  . cholecalciferol (VITAMIN D) 1000 units tablet Take 1,000 Units by mouth daily.    . cyanocobalamin (CVS VITAMIN B12) 1000 MCG tablet Take 1 tablet (1,000 mcg total) by mouth daily.    Marland Kitchen dofetilide (TIKOSYN) 500 MCG capsule Take 1 capsule (500 mcg total) by mouth 2 (two) times daily. 60 capsule 6  . furosemide (LASIX) 40 MG tablet Take 1 tablet (40 mg total) by mouth daily. (Patient taking differently: Take 40 mg by mouth daily as needed for edema.) 30 tablet 6  . losartan (COZAAR) 50 MG tablet Take 50 mg by mouth daily.    Marland Kitchen omeprazole (PRILOSEC) 10 MG capsule Take 1 capsule (10 mg total) by mouth daily. 90 capsule 3  . polyethylene glycol (MIRALAX / GLYCOLAX) packet Take 17 g by mouth as needed for mild constipation.     . potassium chloride SA (KLOR-CON) 20 MEQ tablet Take 60 mEq by mouth daily as needed (Takes with lasix).    . rosuvastatin (CRESTOR) 10 MG tablet Take 1 tablet (10 mg total) by mouth daily. 90 tablet 3  . tamsulosin (FLOMAX) 0.4 MG CAPS capsule Take 1 capsule (0.4 mg total) by mouth daily. 90 capsule 3   No current facility-administered medications for this encounter.    No Known Allergies  Social History   Socioeconomic History  . Marital status: Widowed    Spouse name: Not on file  . Number of children: 5  . Years of education: Not on file  . Highest education level: Not on file  Occupational History  . 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 Smoking status: Former Smoker    Packs/day: 1.00    Years: 10.00    Pack years: 10.00    Types: Cigarettes    Quit date: 02/26/1985    Years since quitting: 35.2  . Smokeless tobacco: Never Used  Vaping Use  . Vaping Use: Never used  Substance and Sexual Activity  . Alcohol use: No  . Drug use: No  . Sexual activity: Not on file  Other Topics Concern  . Not on file  Social History Narrative  . Not  on file   Social Determinants of Health   Financial Resource Strain: Not on file  Food Insecurity: Not on file  Transportation Needs: Not on file  Physical Activity: Not on file  Stress: Not on file  Social Connections: Not on file  Intimate Partner Violence: Not on file    Family History  Problem Relation Age of Onset  . Heart attack Brother 5       Ventircular rupture  . Emphysema Brother   . Lung cancer Brother   . Heart disease Mother        Enlarged heart  . Stroke Mother   . Hyperlipidemia Father   . CAD Father   . Hypertension Sister   .  CAD Paternal Uncle   . Stroke Paternal Uncle   . Cancer Maternal Grandfather        colon  . Colon cancer Maternal Grandfather   . Asthma Brother     ROS- All systems are reviewed and negative except as per the HPI above  Physical Exam: Vitals:   05/19/20 1143  BP: (!) 128/50  Pulse: (!) 51  Weight: 79.5 kg  Height: 5\' 7"  (1.702 m)   Wt Readings from Last 3 Encounters:  05/19/20 79.5 kg  04/29/20 78.9 kg  04/04/20 81.2 kg    Labs: Lab Results  Component Value Date   NA 143 05/05/2020   K 4.5 05/05/2020   CL 99 05/05/2020   CO2 32 (H) 05/05/2020   GLUCOSE 94 05/05/2020   BUN 19 05/05/2020   CREATININE 0.92 05/05/2020   CALCIUM 9.0 05/05/2020   MG 2.2 02/29/2020   Lab Results  Component Value Date   INR 1.3 (H) 01/13/2020   Lab Results  Component Value Date   CHOL 128 03/11/2019   HDL 35 (L) 03/11/2019   LDLCALC 74 03/11/2019   TRIG 103 03/11/2019     GEN- The patient is well appearing, alert and oriented x 3 today.   Head- normocephalic, atraumatic Eyes-  Sclera clear, conjunctiva pink Ears- hearing intact Oropharynx- clear Neck- supple, no JVP Lymph- no cervical lymphadenopathy Lungs- Clear to ausculation bilaterally, normal work of breathing Heart- regular rate and rhythm, no murmurs, rubs or gallops, PMI not laterally displaced GI- soft, NT, ND, + BS Extremities- no clubbing, cyanosis, or  edema MS- no significant deformity or atrophy Skin- no rash or lesion Psych- euthymic mood, full affect Neuro- strength and sensation are intact  EKG- sinus brady at 51 bpm, with first degree AV block  pr int 224 ms, qrs int 124 ms, qtc 446 ms   Assessment and Plan: 1. Atrial  flutter  Scheduled for CV 3/14, but he presented in SR  In [place] today as well  He  does not feel any different in SR vrs out of rhythm  Continue dofetilide 500 mcg bid   Qt stable    2. CHA2DS2VASc score of 5 Continue  eliquis 5 mg bid    F/u with Joshua [name]  08/01/20     Geroge Baseman. Ragnar Waas, [facility] [address] [place], [place] [address] [phone]

## 2020-06-13 ENCOUNTER — Other Ambulatory Visit: Payer: Self-pay

## 2020-06-13 ENCOUNTER — Encounter: Payer: Self-pay | Admitting: Family Medicine

## 2020-06-13 ENCOUNTER — Ambulatory Visit: Payer: Medicare PPO | Admitting: Family Medicine

## 2020-06-13 VITALS — BP 100/37 | HR 64 | Ht 67.0 in | Wt 177.0 lb

## 2020-06-13 DIAGNOSIS — D691 Qualitative platelet defects: Secondary | ICD-10-CM | POA: Diagnosis not present

## 2020-06-13 DIAGNOSIS — J449 Chronic obstructive pulmonary disease, unspecified: Secondary | ICD-10-CM

## 2020-06-13 DIAGNOSIS — I480 Paroxysmal atrial fibrillation: Secondary | ICD-10-CM | POA: Diagnosis not present

## 2020-06-13 DIAGNOSIS — E785 Hyperlipidemia, unspecified: Secondary | ICD-10-CM

## 2020-06-13 DIAGNOSIS — N400 Enlarged prostate without lower urinary tract symptoms: Secondary | ICD-10-CM | POA: Diagnosis not present

## 2020-06-13 DIAGNOSIS — Z7901 Long term (current) use of anticoagulants: Secondary | ICD-10-CM

## 2020-06-13 MED ORDER — TAMSULOSIN HCL 0.4 MG PO CAPS: 0.4000 mg | ORAL_CAPSULE | Freq: Every day | ORAL | 3 refills | Status: AC

## 2020-06-13 MED ORDER — LOSARTAN POTASSIUM 25 MG PO TABS: 12.5000 mg | ORAL_TABLET | Freq: Every day | ORAL | 3 refills | Status: DC

## 2020-06-13 NOTE — Progress Notes (Signed)
BP (!) 100/37   Pulse 64   Ht 5\' 7"  (1.702 m)   Wt 177 lb (80.3 kg)   SpO2 92%   BMI 27.72 kg/m    Subjective:   Patient ID: Joshua [name], male    DOB: [birth year], 75 y.o.   MRN: [id number]  HPI: Joshua [name] is a 75 y.o. male presenting on 06/13/2020 for Medical Management of Chronic Issues, Hyperlipidemia, and long term use anticoagulants   HPI Hyperlipidemia Patient is coming in for recheck of his hyperlipidemia. The patient is currently taking Crestor. They deny any issues with myalgias or history of liver damage from it. They deny any focal numbness or weakness or chest pain.   A. fib recheck Reason on anticoagulation: Paroxysmal A. fib, takes Eliquis Patient denies any bruising or bleeding or chest pain or palpitations   BPH Patient is coming in for recheck on BPH Symptoms: None currently Medication: Flomax Last PSA: Over a year ago    Relevant past medical, surgical, family and social history reviewed and updated as indicated. Interim medical history since our last visit reviewed. Allergies and medications reviewed and updated.  Review of Systems  Constitutional: Positive for fatigue. Negative for chills and fever.  Eyes: Negative for visual disturbance.  Respiratory: Positive for shortness of breath (On oxygen is doing well). Negative for cough and wheezing.   Cardiovascular: Negative for chest pain and leg swelling.  Musculoskeletal: Negative for back pain and gait problem.  Skin: Negative for rash.  Neurological: Negative for dizziness, weakness and light-headedness.  All other systems reviewed and are negative.   Per HPI unless specifically indicated above   Allergies as of 06/13/2020   No Known Allergies     Medication List       Accurate as of June 13, 2020 10:55 AM. If you have any questions, ask your nurse or doctor.        apixaban 5 MG Tabs tablet Commonly known as: ELIQUIS Take 1 tablet (5 mg total) by mouth 2 (two) times daily.    cholecalciferol 1000 units tablet Commonly known as: VITAMIN D Take 1,000 Units by mouth daily.   cyanocobalamin 1000 MCG tablet Commonly known as: CVS VITAMIN B12 Take 1 tablet (1,000 mcg total) by mouth daily.   dofetilide 500 MCG capsule Commonly known as: TIKOSYN TAKE 1 CAPSULE (500 MCG TOTAL) BY MOUTH 2 (TWO) TIMES DAILY.   dofetilide 500 MCG capsule Commonly known as: Tikosyn Take 1 capsule (500 mcg total) by mouth 2 (two) times daily.   furosemide 40 MG tablet Commonly known as: LASIX TAKE 1 TABLET (40 MG TOTAL) BY MOUTH DAILY. What changed: Another medication with the same name was changed. Make sure you understand how and when to take each.   furosemide 40 MG tablet Commonly known as: Lasix Take 1 tablet (40 mg total) by mouth daily. What changed:   when to take this  reasons to take this   losartan 50 MG tablet Commonly known as: COZAAR Take 50 mg by mouth daily.   omeprazole 10 MG capsule Commonly known as: PRILOSEC Take 1 capsule (10 mg total) by mouth daily.   polyethylene glycol 17 g packet Commonly known as: MIRALAX / GLYCOLAX Take 17 g by mouth as needed for mild constipation.   potassium chloride SA 20 MEQ tablet Commonly known as: KLOR-CON TAKE 1 TABLET (20 MEQ TOTAL) BY MOUTH DAILY.   potassium chloride SA 20 MEQ tablet Commonly known as: KLOR-CON Take 60 mEq by  mouth daily as needed (Takes with lasix).   rosuvastatin 10 MG tablet Commonly known as: CRESTOR Take 1 tablet (10 mg total) by mouth daily.   tamsulosin 0.4 MG Caps capsule Commonly known as: FLOMAX Take 1 capsule (0.4 mg total) by mouth daily.        Objective:   BP (!) 100/37   Pulse 64   Ht 5\' 7"  (1.702 m)   Wt 177 lb (80.3 kg)   SpO2 92%   BMI 27.72 kg/m   Wt Readings from Last 3 Encounters:  06/13/20 177 lb (80.3 kg)  05/19/20 175 lb 3.2 oz (79.5 kg)  04/29/20 174 lb (78.9 kg)    Physical Exam Vitals and nursing note reviewed.  Constitutional:       General: He is not in acute distress.    Appearance: He is well-developed. He is not diaphoretic.  Eyes:     General: No scleral icterus.    Conjunctiva/sclera: Conjunctivae normal.  Neck:     Thyroid: No thyromegaly.  Cardiovascular:     Rate and Rhythm: Normal rate. Rhythm irregular.     Heart sounds: Murmur (2-6 systolic murmur) heard.    Pulmonary:     Effort: Pulmonary effort is normal. No respiratory distress.     Breath sounds: Normal breath sounds. No wheezing.  Musculoskeletal:        General: Normal range of motion.     Cervical back: Neck supple.  Lymphadenopathy:     Cervical: No cervical adenopathy.  Skin:    General: Skin is warm and dry.     Findings: No rash.  Neurological:     Mental Status: He is alert and oriented to person, place, and time.     Coordination: Coordination normal.  Psychiatric:        Behavior: Behavior normal.       Assessment & Plan:   Problem List Items Addressed This Visit      Cardiovascular and Mediastinum   PAF (paroxysmal atrial fibrillation) (HCC)     Respiratory   COPD (chronic obstructive pulmonary disease) (HCC)     Genitourinary   BPH (benign prostatic hyperplasia)   Relevant Medications   tamsulosin (FLOMAX) 0.4 MG CAPS capsule   Other Relevant Orders   PSA, total and free     Other   Hyperlipidemia LDL goal <70 - Primary   Relevant Orders   Lipid panel   Long term (current) use of anticoagulants [Z79.01]    Other Visit Diagnoses    Prostate enlargement       Relevant Medications   tamsulosin (FLOMAX) 0.4 MG CAPS capsule      Sees cardiology for his A. fib, they have adjusted some of his medications recently and takes Eliquis for blood thinner.  Blood pressure is on the lower side so we will lower his blood pressure medicine.  He is mainly on it for renal protection.  After most recent hospitalization, patient was placed on 2 to 3 L nasal cannula all of the time.  He is on currently here in the office.   States that his oxygen desaturation was significant when he was in the hospital and then he probably should have been on it before due to pulmonary scarring.  Follow up plan: Return in about 6 months (around 12/13/2020), or if symptoms worsen or fail to improve, for Hyperlipidemia and BPH and A. fib.  Counseling provided for all of the vaccine components No orders of the defined types were placed in  this encounter.   Caryl Pina, MD [facility] 06/13/2020, 10:55 AM

## 2020-06-13 NOTE — Addendum Note (Signed)
Addended by: Liliane Bade on: 06/13/2020 11:18 AM   Modules accepted: Orders

## 2020-06-14 LAB — CBC WITH DIFFERENTIAL/PLATELET
Basophils Absolute: 0 10*3/uL (ref 0.0–0.2)
Basos: 1 %
EOS (ABSOLUTE): 0.1 10*3/uL (ref 0.0–0.4)
Eos: 2 %
Hematocrit: 47.2 % (ref 37.5–51.0)
Hemoglobin: 15.6 g/dL (ref 13.0–17.7)
Immature Grans (Abs): 0 10*3/uL (ref 0.0–0.1)
Immature Granulocytes: 0 %
Lymphocytes Absolute: 1 10*3/uL (ref 0.7–3.1)
Lymphs: 21 %
MCH: 31.6 pg (ref 26.6–33.0)
MCHC: 33.1 g/dL (ref 31.5–35.7)
MCV: 96 fL (ref 79–97)
Monocytes Absolute: 0.4 10*3/uL (ref 0.1–0.9)
Monocytes: 9 %
Neutrophils Absolute: 3 10*3/uL (ref 1.4–7.0)
Neutrophils: 67 %
Platelets: 91 10*3/uL — CL (ref 150–450)
RBC: 4.93 x10E6/uL (ref 4.14–5.80)
RDW: 11.5 % — ABNORMAL LOW (ref 11.6–15.4)
WBC: 4.4 10*3/uL (ref 3.4–10.8)

## 2020-06-14 LAB — LIPID PANEL
Chol/HDL Ratio: 1.9 ratio (ref 0.0–5.0)
Cholesterol, Total: 149 mg/dL (ref 100–199)
HDL: 79 mg/dL (ref >39)
LDL Chol Calc (NIH): 58 mg/dL (ref 0–99)
Triglycerides: 59 mg/dL (ref 0–149)
VLDL Cholesterol Cal: 12 mg/dL (ref 5–40)

## 2020-06-14 LAB — PSA, TOTAL AND FREE
PSA, Free Pct: 50 %
PSA, Free: 0.15 ng/mL
Prostate Specific Ag, Serum: 0.3 ng/mL (ref 0.0–4.0)

## 2020-06-15 ENCOUNTER — Ambulatory Visit: Payer: Medicare PPO | Admitting: Family Medicine

## 2020-07-06 ENCOUNTER — Other Ambulatory Visit: Payer: Self-pay | Admitting: Family Medicine

## 2020-08-01 ENCOUNTER — Encounter: Payer: Self-pay | Admitting: Student

## 2020-08-01 ENCOUNTER — Other Ambulatory Visit: Payer: Self-pay

## 2020-08-01 ENCOUNTER — Ambulatory Visit (INDEPENDENT_AMBULATORY_CARE_PROVIDER_SITE_OTHER): Payer: Medicare PPO | Admitting: Student

## 2020-08-01 VITALS — BP 120/88 | HR 54 | Ht 67.0 in | Wt 176.0 lb

## 2020-08-01 DIAGNOSIS — I421 Obstructive hypertrophic cardiomyopathy: Secondary | ICD-10-CM | POA: Diagnosis not present

## 2020-08-01 DIAGNOSIS — I5032 Chronic diastolic (congestive) heart failure: Secondary | ICD-10-CM

## 2020-08-01 DIAGNOSIS — I4892 Unspecified atrial flutter: Secondary | ICD-10-CM

## 2020-08-01 DIAGNOSIS — J449 Chronic obstructive pulmonary disease, unspecified: Secondary | ICD-10-CM | POA: Diagnosis not present

## 2020-08-01 DIAGNOSIS — I4819 Other persistent atrial fibrillation: Secondary | ICD-10-CM | POA: Diagnosis not present

## 2020-08-01 NOTE — Patient Instructions (Signed)
Medication Instructions:  Your physician recommends that you continue on your current medications as directed. Please refer to the Current Medication list given to you today.  *If you need a refill on your cardiac medications before your next appointment, please call your pharmacy*   Lab Work: TODAY: BMET, MAG, CBC  If you have labs (blood work) drawn today and your tests are completely normal, you will receive your results only by: Marland Kitchen MyChart Message (if you have MyChart) OR . A paper copy in the mail If you have any lab test that is abnormal or we need to change your treatment, we will call you to review the results.   Testing/Procedures: Your physician has requested that you have an echocardiogram in August. Echocardiography is a painless test that uses sound waves to create images of your heart. It provides your doctor with information about the size and shape of your heart and how well your heart's chambers and valves are working. This procedure takes approximately one hour. There are no restrictions for this procedure.   Follow-Up: At [facility], you and your health needs are our priority.  As part of our continuing mission to provide you with exceptional heart care, we have created designated Provider Care Teams.  These Care Teams include your primary Cardiologist (physician) and Advanced Practice Providers (APPs -  Physician Assistants and Nurse Practitioners) who all work together to provide you with the care you need, when you need it.  Your next appointment:   As scheduled  Other Instructions [employer]  FDA-cleared EKG at your fingertips. - [employer]   Agricultural [employer], [employer]. [email or web address]   FDA-cleared, clinical grade mobile EKG monitor: Jodelle Red is the most clinically-validated mobile EKG used by the world's leading cardiac care medical professionals.  This may be useful in monitoring palpitations.  We do not have access to  have them emailed and reviewed but will be glad to review while in the office.

## 2020-08-01 NOTE — Progress Notes (Signed)
PCP:  [name], [name], MD Primary Cardiologist: Shelva Majestic, MD Electrophysiologist: Thompson Grayer, MD   Joshua [name] is a 75 y.o. male seen today for Thompson Grayer, MD for routine electrophysiology followup.  Since last being seen in our clinic the patient reports doing about the same. He he back in flutter today. He reported in AF clinic notes that he felt similar to how he does today in sinus at that time. He does most of his ADLs without difficulty. He is now wearing O2 full time. Occasionally he feels like he can't take a deep breath. He has Trace to 1+ peripheral edema at baseline. When "bad" he has "rolls" up to his thighs. He states his edema today is "nothing" compared to how bad it can be.  Compression hose above the knee cause him significant discomfort.  He denies palpitations and cannot by rhythm or rate if he is in Fib/flutter.  Past Medical History:  Diagnosis Date  . Acute lower GI bleeding    related to gastritis / viral infection  . Cataract   . Cirrhosis ([place])   . COPD (chronic obstructive pulmonary disease) ([place])    PFT 05-05-2010, FEV1 .9 (26%) ratio 60  . Coronary artery disease   . Gastritis 06-10-2006  . Hiatal hernia 06-10-2006   EGD  . HOH (hard of hearing)   . Hx of adenomatous colonic polyps 2005, 2013   Colonoscopy  . Hypercholesteremia   . Hypertension   . Hypertr obst cardiomyop    mild subaortic stenosis  . Iron deficiency anemia, unspecified   . LVH (left ventricular hypertrophy)   . Other B-complex deficiencies   . Persistent atrial fibrillation (H[facility])    persistent  . Rheumatic fever   . Thrombocytopenia ([place]) 05/2008   Past Surgical History:  Procedure Laterality Date  . ATRIAL FLUTTER ABLATION  06/2010   CTI ablation performed by [name]  . CARDIAC CATHETERIZATION  05/28/2005   Widely patent coronaries and normal LV function.  Marland Kitchen CARDIAC CATHETERIZATION  08/11/2001   80% LAD stenosis successfully stented with a 3.5x49mm Cypher DES  postdilated to 3.69mm resulting in reduction of 80% to 0%.  . CARDIAC CATHETERIZATION  01/15/2001   Focal 95% proximal RCA stenosis, stented with a 3x43mm Zeta multilink stent with postdilatation utilizing a 3.5x29mm Quantum balloon with stenosis being reduced to 0%.  . CARDIOVASCULAR STRESS TEST  05/29/2011   No scintigraphic evidence of inducible myocardial ischemia. No ECG changes. EKG negative for ischemia.  Marland Kitchen CARDIOVERSION N/A 10/20/2019   Procedure: CARDIOVERSION;  Surgeon: Sanda Klein, MD;  Location: [facility] ENDOSCOPY;  Service: Cardiovascular;  Laterality: N/A;  . CARDIOVERSION N/A 01/14/2020   Procedure: CARDIOVERSION;  Surgeon: Buford Dresser, MD;  Location: [facility] ENDOSCOPY;  Service: Cardiovascular;  Laterality: N/A;  . CORONARY ANGIOPLASTY     2002-2003  . SHOULDER SURGERY    . TRANSTHORACIC ECHOCARDIOGRAM  05/29/2011   EF >70%, severe concentric LV hypertrophy, severe mitral annular calcification, mild-moderate aortic regurg,     Current Outpatient Medications  Medication Sig Dispense Refill  . apixaban (ELIQUIS) 5 MG TABS tablet Take 1 tablet (5 mg total) by mouth 2 (two) times daily. 180 tablet 1  . cholecalciferol (VITAMIN D) 1000 units tablet Take 1,000 Units by mouth daily.    . cyanocobalamin ([employer] VITAMIN B12) 1000 MCG tablet Take 1 tablet (1,000 mcg total) by mouth daily.    Marland Kitchen dofetilide (TIKOSYN) 500 MCG capsule Take 1 capsule (500 mcg total) by mouth 2 (two)  times daily. 60 capsule 6  . dofetilide (TIKOSYN) 500 MCG capsule TAKE 1 CAPSULE (500 MCG TOTAL) BY MOUTH 2 (TWO) TIMES DAILY. 60 capsule 0  . furosemide (LASIX) 40 MG tablet Take 1 tablet (40 mg total) by mouth daily. (Patient taking differently: Take 40 mg by mouth daily as needed for edema.) 30 tablet 6  . furosemide (LASIX) 40 MG tablet TAKE 1 TABLET (40 MG TOTAL) BY MOUTH DAILY. 30 tablet 0  . losartan (COZAAR) 25 MG tablet Take 0.5 tablets (12.5 mg total) by mouth daily. 45 tablet 3  . omeprazole (PRILOSEC) 10  MG capsule TAKE ONE CAPSULE BY MOUTH ONE TIME DAILY 90 capsule 0  . polyethylene glycol (MIRALAX / GLYCOLAX) packet Take 17 g by mouth as needed for mild constipation.     . potassium chloride SA (KLOR-CON) 20 MEQ tablet Take 60 mEq by mouth daily as needed (Takes with lasix).    . potassium chloride SA (KLOR-CON) 20 MEQ tablet TAKE 1 TABLET (20 MEQ TOTAL) BY MOUTH DAILY. 30 tablet 0  . rosuvastatin (CRESTOR) 10 MG tablet Take 1 tablet (10 mg total) by mouth daily. 90 tablet 3  . tamsulosin (FLOMAX) 0.4 MG CAPS capsule Take 1 capsule (0.4 mg total) by mouth daily. 90 capsule 3   No current facility-administered medications for this visit.    No Known Allergies  Social History   Socioeconomic History  . Marital status: Widowed    Spouse name: Not on file  . Number of children: 5  . Years of education: Not on file  . Highest education level: Not on file  Occupational History  . 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 Smoking status: Former Smoker    Packs/day: 1.00    Years: 10.00    Pack years: 10.00    Types: Cigarettes    Quit date: 02/26/1985    Years since quitting: 35.4  . Smokeless tobacco: Never Used  Vaping Use  . Vaping Use: Never used  Substance and Sexual Activity  . Alcohol use: No  . Drug use: No  . Sexual activity: Not on file  Other Topics Concern  . Not on file  Social History Narrative  . Not on file   Social Determinants of Health   Financial Resource Strain: Not on file  Food Insecurity: Not on file  Transportation Needs: Not on file  Physical Activity: Not on file  Stress: Not on file  Social Connections: Not on file  Intimate Partner Violence: Not on file     Review of Systems: General: No chills, fever, night sweats or weight changes  Cardiovascular:  No chest pain, dyspnea on exertion, edema, orthopnea, palpitations, paroxysmal nocturnal dyspnea Dermatological: No rash, lesions or masses Respiratory: No  cough, dyspnea Urologic: No hematuria, dysuria Abdominal: No nausea, vomiting, diarrhea, bright red blood per rectum, melena, or hematemesis Neurologic: No visual changes, weakness, changes in mental status All other systems reviewed and are otherwise negative except as noted above.  Physical Exam: Vitals:   08/01/20 1035  BP: 120/88  Pulse: (!) 54  SpO2: 91%  Weight: 176 lb (79.8 kg)  Height: 5\' 7"  (1.702 m)    GEN- The patient is well appearing, alert and oriented x 3 today.   HEENT: normocephalic, atraumatic; sclera clear, conjunctiva pink; hearing intact; oropharynx clear; neck supple, no JVP Lymph- no cervical lymphadenopathy Lungs- Clear to ausculation bilaterally, normal work of breathing.  No wheezes, rales, rhonchi Heart- Regular  rate and rhythm, no murmurs, rubs or gallops, PMI not laterally displaced GI- soft, non-tender, non-distended, bowel sounds present, no hepatosplenomegaly Extremities- no clubbing or cyanosis. 1+ edema half way to the knees.; DP/PT/radial pulses 2+ bilaterally MS- no significant deformity or atrophy Skin- warm and dry, no rash or lesion Psych- euthymic mood, full affect Neuro- strength and sensation are intact  EKG is ordered. Personal review of EKG from today shows Atrial flutter at 54 bpm  Additional studies reviewed include: Previous EP notes  Assessment and Plan:  1.Persistent atrialfibrillation/ atypical atrial flutter EKG shows he is back in atypical flutter today.    Continue Tikosyn 500BID for now.  ContinueEliquisforCHA2DS2VASC ofat least 4. BMET and Mg today.  His rates are well controlled and I think his fatigue is multifactorial As previously he is a poor candidate for ablation of his Fib with h/o rheumatic fever, HCM, and atrial enlargement with anticipated success rates of 50-60% at most. His likelihood of complication has also increased as his oxygen demand has.  Ablation of his flutter without addressing his fib  would be unlikely to provide him benefit in the long run, and with it's atypical appearance success would also be lower.  He is felt to be a poor candidate for amiodarone given his prior and, recently worsening, lung issues. Will discuss with [name] if we feel at this time continue Tikosyn is best for the patient. I have recommended he get a Kardia device to check and see his rhythm, especially on "good" or "bad" days to see if we can better correlate his overall condition with his arrhythmia. I discussed with the patient today I feel his arrhythmias are not the primary source of his malaise, and even aggressive management of his fib/flutter may still be unlikely to provide clinical improvement.  2.Chronicchronic diastolic CHF Volume status OK on exam.  Continue lasix 40 mg daily with extra as needed.BMET and Mg as above Encouraged fluid restriction to <2L daily, and sodium restriction.  Confounded by chronic venous stasis.  3.COPD with chronichypoxic respiratory failure Continue O2. He is following pulmonary.  PFTs with restrictive lung disease likely from pleural effusions that developed into chronic fibrothoraces per notes.  Sleep study results pending.  Tentatively f/u with [name] in 6-8 weeks. Can adjust accordingly after discussing with him.   Shirley Friar, PA-C  08/01/20 8:19 AM

## 2020-08-02 LAB — BASIC METABOLIC PANEL
BUN/Creatinine Ratio: 26 — ABNORMAL HIGH (ref 10–24)
BUN: 24 mg/dL (ref 8–27)
CO2: 37 mmol/L — ABNORMAL HIGH (ref 20–29)
Calcium: 8.7 mg/dL (ref 8.6–10.2)
Chloride: 94 mmol/L — ABNORMAL LOW (ref 96–106)
Creatinine, Ser: 0.94 mg/dL (ref 0.76–1.27)
Glucose: 99 mg/dL (ref 65–99)
Potassium: 3.9 mmol/L (ref 3.5–5.2)
Sodium: 143 mmol/L (ref 134–144)
eGFR: 85 mL/min/{1.73_m2} (ref >59)

## 2020-08-02 LAB — MAGNESIUM: Magnesium: 2.2 mg/dL (ref 1.6–2.3)

## 2020-08-02 LAB — CBC
Hematocrit: 41.7 % (ref 37.5–51.0)
Hemoglobin: 13.5 g/dL (ref 13.0–17.7)
MCH: 31.3 pg (ref 26.6–33.0)
MCHC: 32.4 g/dL (ref 31.5–35.7)
MCV: 97 fL (ref 79–97)
Platelets: 95 10*3/uL — CL (ref 150–450)
RBC: 4.32 x10E6/uL (ref 4.14–5.80)
RDW: 12.4 % (ref 11.6–15.4)
WBC: 5.1 10*3/uL (ref 3.4–10.8)

## 2020-09-05 ENCOUNTER — Emergency Department (HOSPITAL_COMMUNITY): Payer: Medicare PPO

## 2020-09-05 ENCOUNTER — Observation Stay (HOSPITAL_COMMUNITY)
Admission: EM | Admit: 2020-09-05 | Discharge: 2020-09-06 | Disposition: A | Discharge status: Home / Self Care | Payer: Medicare PPO | Attending: Internal Medicine | Admitting: Internal Medicine

## 2020-09-05 ENCOUNTER — Other Ambulatory Visit: Payer: Self-pay

## 2020-09-05 ENCOUNTER — Encounter (HOSPITAL_COMMUNITY): Payer: Self-pay | Admitting: Internal Medicine

## 2020-09-05 DIAGNOSIS — I509 Heart failure, unspecified: Secondary | ICD-10-CM

## 2020-09-05 DIAGNOSIS — R0602 Shortness of breath: Secondary | ICD-10-CM | POA: Diagnosis not present

## 2020-09-05 DIAGNOSIS — Z9981 Dependence on supplemental oxygen: Secondary | ICD-10-CM | POA: Insufficient documentation

## 2020-09-05 DIAGNOSIS — I5021 Acute systolic (congestive) heart failure: Secondary | ICD-10-CM | POA: Insufficient documentation

## 2020-09-05 DIAGNOSIS — I421 Obstructive hypertrophic cardiomyopathy: Secondary | ICD-10-CM | POA: Diagnosis present

## 2020-09-05 DIAGNOSIS — M7989 Other specified soft tissue disorders: Secondary | ICD-10-CM

## 2020-09-05 DIAGNOSIS — Z20822 Contact with and (suspected) exposure to covid-19: Secondary | ICD-10-CM | POA: Diagnosis not present

## 2020-09-05 DIAGNOSIS — K922 Gastrointestinal hemorrhage, unspecified: Principal | ICD-10-CM | POA: Insufficient documentation

## 2020-09-05 DIAGNOSIS — I2510 Atherosclerotic heart disease of native coronary artery without angina pectoris: Secondary | ICD-10-CM | POA: Diagnosis not present

## 2020-09-05 DIAGNOSIS — R109 Unspecified abdominal pain: Secondary | ICD-10-CM | POA: Diagnosis not present

## 2020-09-05 DIAGNOSIS — J449 Chronic obstructive pulmonary disease, unspecified: Secondary | ICD-10-CM | POA: Diagnosis not present

## 2020-09-05 DIAGNOSIS — I480 Paroxysmal atrial fibrillation: Secondary | ICD-10-CM | POA: Diagnosis not present

## 2020-09-05 DIAGNOSIS — K625 Hemorrhage of anus and rectum: Secondary | ICD-10-CM | POA: Diagnosis not present

## 2020-09-05 DIAGNOSIS — Z87891 Personal history of nicotine dependence: Secondary | ICD-10-CM | POA: Diagnosis not present

## 2020-09-05 DIAGNOSIS — R0902 Hypoxemia: Secondary | ICD-10-CM | POA: Diagnosis not present

## 2020-09-05 DIAGNOSIS — Z7901 Long term (current) use of anticoagulants: Secondary | ICD-10-CM | POA: Diagnosis not present

## 2020-09-05 DIAGNOSIS — I959 Hypotension, unspecified: Secondary | ICD-10-CM | POA: Diagnosis not present

## 2020-09-05 DIAGNOSIS — J90 Pleural effusion, not elsewhere classified: Secondary | ICD-10-CM | POA: Diagnosis not present

## 2020-09-05 DIAGNOSIS — I517 Cardiomegaly: Secondary | ICD-10-CM | POA: Diagnosis not present

## 2020-09-05 DIAGNOSIS — I4581 Long QT syndrome: Secondary | ICD-10-CM | POA: Diagnosis not present

## 2020-09-05 DIAGNOSIS — D735 Infarction of spleen: Secondary | ICD-10-CM

## 2020-09-05 DIAGNOSIS — I4891 Unspecified atrial fibrillation: Secondary | ICD-10-CM | POA: Diagnosis not present

## 2020-09-05 DIAGNOSIS — I110 Hypertensive heart disease with heart failure: Secondary | ICD-10-CM | POA: Diagnosis not present

## 2020-09-05 DIAGNOSIS — K7460 Unspecified cirrhosis of liver: Secondary | ICD-10-CM | POA: Diagnosis present

## 2020-09-05 DIAGNOSIS — I5023 Acute on chronic systolic (congestive) heart failure: Secondary | ICD-10-CM | POA: Diagnosis not present

## 2020-09-05 DIAGNOSIS — Z79899 Other long term (current) drug therapy: Secondary | ICD-10-CM | POA: Diagnosis not present

## 2020-09-05 LAB — COMPREHENSIVE METABOLIC PANEL
ALT: 16 U/L (ref 0–44)
AST: 21 U/L (ref 15–41)
Albumin: 3.3 g/dL — ABNORMAL LOW (ref 3.5–5.0)
Alkaline Phosphatase: 50 U/L (ref 38–126)
Anion gap: <3 — ABNORMAL LOW (ref 5–15)
BUN: 27 mg/dL — ABNORMAL HIGH (ref 8–23)
CO2: 47 mmol/L — ABNORMAL HIGH (ref 22–32)
Calcium: 8.5 mg/dL — ABNORMAL LOW (ref 8.9–10.3)
Chloride: 92 mmol/L — ABNORMAL LOW (ref 98–111)
Creatinine, Ser: 1.03 mg/dL (ref 0.61–1.24)
GFR, Estimated: >60 mL/min (ref >60)
Glucose, Bld: 95 mg/dL (ref 70–99)
Potassium: 4.7 mmol/L (ref 3.5–5.1)
Sodium: 140 mmol/L (ref 135–145)
Total Bilirubin: 1.2 mg/dL (ref 0.3–1.2)
Total Protein: 6.9 g/dL (ref 6.5–8.1)

## 2020-09-05 LAB — CBC WITH DIFFERENTIAL/PLATELET
Abs Immature Granulocytes: 0.02 10*3/uL (ref 0.00–0.07)
Basophils Absolute: 0 10*3/uL (ref 0.0–0.1)
Basophils Relative: 0 %
Eosinophils Absolute: 0 10*3/uL (ref 0.0–0.5)
Eosinophils Relative: 0 %
HCT: 44.3 % (ref 39.0–52.0)
Hemoglobin: 13.4 g/dL (ref 13.0–17.0)
Immature Granulocytes: 0 %
Lymphocytes Relative: 11 %
Lymphs Abs: 0.5 10*3/uL — ABNORMAL LOW (ref 0.7–4.0)
MCH: 32.2 pg (ref 26.0–34.0)
MCHC: 30.2 g/dL (ref 30.0–36.0)
MCV: 106.5 fL — ABNORMAL HIGH (ref 80.0–100.0)
Monocytes Absolute: 0.3 10*3/uL (ref 0.1–1.0)
Monocytes Relative: 7 %
Neutro Abs: 3.7 10*3/uL (ref 1.7–7.7)
Neutrophils Relative %: 82 %
Platelets: 110 10*3/uL — ABNORMAL LOW (ref 150–400)
RBC: 4.16 MIL/uL — ABNORMAL LOW (ref 4.22–5.81)
RDW: 14.2 % (ref 11.5–15.5)
WBC: 4.6 10*3/uL (ref 4.0–10.5)
nRBC: 0 % (ref 0.0–0.2)

## 2020-09-05 LAB — SAMPLE TO BLOOD BANK

## 2020-09-05 LAB — BRAIN NATRIURETIC PEPTIDE: B Natriuretic Peptide: 422 pg/mL — ABNORMAL HIGH (ref 0.0–100.0)

## 2020-09-05 LAB — RESP PANEL BY RT-PCR (FLU A&B, COVID) ARPGX2
Influenza A by PCR: NEGATIVE
Influenza B by PCR: NEGATIVE
SARS Coronavirus 2 by RT PCR: NEGATIVE

## 2020-09-05 LAB — HEMOGLOBIN AND HEMATOCRIT, BLOOD
HCT: 43.7 % (ref 39.0–52.0)
Hemoglobin: 13.1 g/dL (ref 13.0–17.0)

## 2020-09-05 LAB — TROPONIN I (HIGH SENSITIVITY)
Troponin I (High Sensitivity): 63 ng/L — ABNORMAL HIGH (ref <18)
Troponin I (High Sensitivity): 63 ng/L — ABNORMAL HIGH (ref <18)

## 2020-09-05 LAB — POC OCCULT BLOOD, ED: Fecal Occult Bld: POSITIVE — AB

## 2020-09-05 LAB — MAGNESIUM: Magnesium: 2.5 mg/dL — ABNORMAL HIGH (ref 1.7–2.4)

## 2020-09-05 MED ORDER — IOHEXOL 300 MG/ML  SOLN
25.0000 mL | Freq: Once | INTRAMUSCULAR | Status: AC | PRN
  Administered 2020-09-05: 25 mL via INTRAVENOUS

## 2020-09-05 MED ORDER — LOSARTAN POTASSIUM 50 MG PO TABS
50.0000 mg | ORAL_TABLET | Freq: Every day | ORAL | Status: DC
  Administered 2020-09-06: 50 mg via ORAL
  Filled 2020-09-05: qty 1

## 2020-09-05 MED ORDER — ROSUVASTATIN CALCIUM 10 MG PO TABS
10.0000 mg | ORAL_TABLET | Freq: Every day | ORAL | Status: DC
  Administered 2020-09-06: 10 mg via ORAL
  Filled 2020-09-05: qty 1

## 2020-09-05 MED ORDER — IOHEXOL 300 MG/ML  SOLN
100.0000 mL | Freq: Once | INTRAMUSCULAR | Status: AC | PRN
  Administered 2020-09-05: 25 mL via INTRAVENOUS

## 2020-09-05 MED ORDER — LORAZEPAM 0.5 MG PO TABS: 0.5000 mg | ORAL_TABLET | Freq: Every evening | ORAL | Status: DC | PRN

## 2020-09-05 MED ORDER — TAMSULOSIN HCL 0.4 MG PO CAPS
0.4000 mg | ORAL_CAPSULE | Freq: Every day | ORAL | Status: DC
  Administered 2020-09-06: 0.4 mg via ORAL
  Filled 2020-09-05: qty 1

## 2020-09-05 MED ORDER — PANTOPRAZOLE SODIUM 40 MG PO TBEC
40.0000 mg | DELAYED_RELEASE_TABLET | Freq: Every day | ORAL | Status: DC
  Administered 2020-09-06: 40 mg via ORAL
  Filled 2020-09-05: qty 1

## 2020-09-05 MED ORDER — ACETAMINOPHEN 650 MG RE SUPP: 650.0000 mg | Freq: Four times a day (QID) | RECTAL | Status: DC | PRN

## 2020-09-05 MED ORDER — POLYETHYLENE GLYCOL 3350 17 G PO PACK: 17.0000 g | PACK | Freq: Every day | ORAL | Status: DC | PRN

## 2020-09-05 MED ORDER — DOFETILIDE 500 MCG PO CAPS
500.0000 ug | ORAL_CAPSULE | Freq: Two times a day (BID) | ORAL | Status: DC
  Administered 2020-09-05 – 2020-09-06 (×2): 500 ug via ORAL
  Filled 2020-09-05 (×2): qty 1

## 2020-09-05 MED ORDER — FUROSEMIDE 10 MG/ML IJ SOLN
60.0000 mg | Freq: Once | INTRAMUSCULAR | Status: AC
  Administered 2020-09-05: 60 mg via INTRAVENOUS
  Filled 2020-09-05: qty 6

## 2020-09-05 MED ORDER — ACETAMINOPHEN 325 MG PO TABS: 650.0000 mg | ORAL_TABLET | Freq: Four times a day (QID) | ORAL | Status: DC | PRN

## 2020-09-05 NOTE — Plan of Care (Signed)

## 2020-09-05 NOTE — H&P (Addendum)
History and Physical    Joshua [name] Joshua [name]:[id number] DOB: Jul 15, 1945 DOA: 09/05/2020  PCP: [name], [name], MD   Patient coming from: Home  I have personally briefly reviewed patient's old medical records in [place]  Chief Complaint: Bleeding per rectum  HPI: Joshua [name] is a 75 y.o. male with medical history significant for  HOCM, COPD, PAF on chronic anticoagulation, liver cirrhosis, hypertension, respiratory failure on 3 L. Presented to the ED with complaints of blood per rectum.  Patient reports yesterday he was constipated, so he took Pepto-Bismol and subsequently had a large bowel movement, with blood.  He reports lots of blood with clots yesterday.  Today he has not had a bowel movement, but when he wiped he saw blood again today.  He reports pain in the right side of his abdomen that has resolved.  No dizziness,  no chest pains. Patient reports chronic and unchanged difficulty breathing for 20 years, he is on chronic home O2.  He reports bilateral leg swelling that he noticed 2 days ago.  Reports compliance with his medications which include Lasix 40 mg daily and Eliquis.  ED Course: Temperature 98.1.  Heart rate 50s to 70s, respiratory rate 13-28, blood pressure systolic ranging from 244-010U systolic.  BNP elevated at 422.  Hemoglobin stable at 13.4.  Abdominal CT shows a small splenic infarct, diverticulosis not noted.  Chest x-ray without acute findings, shows chronic cardiomegaly. 60 mg IV Lasix given.  Hospitalist to admit for rectal bleeding.  Review of Systems: As per HPI all other systems reviewed and negative.  Past Medical History:  Diagnosis Date   Acute lower GI bleeding    related to gastritis / viral infection   Cataract    Cirrhosis ([place])    COPD (chronic obstructive pulmonary disease) ([place])    PFT 05-05-2010, FEV1 .9 (26%) ratio 60   Coronary artery disease    Gastritis 06-10-2006   Hiatal hernia 06-10-2006   EGD   HOH (hard of hearing)    Hx of  adenomatous colonic polyps 2005, 2013   Colonoscopy   Hypercholesteremia    Hypertension    Hypertr obst cardiomyop    mild subaortic stenosis   Iron deficiency anemia, unspecified    LVH (left ventricular hypertrophy)    Other B-complex deficiencies    Persistent atrial fibrillation (HCC)    persistent   Rheumatic fever    Thrombocytopenia ([place]) 05/2008    Past Surgical History:  Procedure Laterality Date   ATRIAL FLUTTER ABLATION  06/2010   CTI ablation performed by [name]   CARDIAC CATHETERIZATION  05/28/2005   Widely patent coronaries and normal LV function.   CARDIAC CATHETERIZATION  08/11/2001   80% LAD stenosis successfully stented with a 3.5x42mm Cypher DES postdilated to 3.2mm resulting in reduction of 80% to 0%.   CARDIAC CATHETERIZATION  01/15/2001   Focal 95% proximal RCA stenosis, stented with a 3x52mm Zeta multilink stent with postdilatation utilizing a 3.5x32mm Quantum balloon with stenosis being reduced to 0%.   CARDIOVASCULAR STRESS TEST  05/29/2011   No scintigraphic evidence of inducible myocardial ischemia. No ECG changes. EKG negative for ischemia.   CARDIOVERSION N/A 10/20/2019   Procedure: CARDIOVERSION;  Surgeon: Sanda Klein, MD;  Location: [facility] ENDOSCOPY;  Service: Cardiovascular;  Laterality: N/A;   CARDIOVERSION N/A 01/14/2020   Procedure: CARDIOVERSION;  Surgeon: Buford Dresser, MD;  Location: [facility] ENDOSCOPY;  Service: Cardiovascular;  Laterality: N/A;   CORONARY ANGIOPLASTY     2002-2003  SHOULDER SURGERY     TRANSTHORACIC ECHOCARDIOGRAM  05/29/2011   EF >70%, severe concentric LV hypertrophy, severe mitral annular calcification, mild-moderate aortic regurg,      reports that he quit smoking about 35 years ago. His smoking use included cigarettes. He has a 10.00 pack-year smoking history. He has never used smokeless tobacco. He reports that he does not drink alcohol and does not use drugs.  No Known Allergies  Family History  Problem  Relation Age of Onset   Heart attack Brother 74       Ventircular rupture   Emphysema Brother    Lung cancer Brother    Heart disease Mother        Enlarged heart   Stroke Mother    Hyperlipidemia Father    CAD Father    Hypertension Sister    CAD Paternal Uncle    Stroke Paternal Uncle    Cancer Maternal Grandfather        colon   Colon cancer Maternal Grandfather    Asthma Brother    Prior to Admission medications   Medication Sig Start Date End Date Taking? Authorizing Provider  apixaban (ELIQUIS) 5 MG TABS tablet Take 1 tablet (5 mg total) by mouth 2 (two) times daily. 12/10/19   Allred, Jeneen Rinks, MD  cholecalciferol (VITAMIN D) 1000 units tablet Take 1,000 Units by mouth daily.    [provider]  cyanocobalamin ([facility] VITAMIN B12) 1000 MCG tablet Take 1 tablet (1,000 mcg total) by mouth daily. 11/24/14   Cherre Robins, PharmD  dofetilide (TIKOSYN) 500 MCG capsule Take 1 capsule (500 mcg total) by mouth 2 (two) times daily. 02/14/20   Shirley Friar, PA-C  dofetilide (TIKOSYN) 500 MCG capsule TAKE 1 CAPSULE (500 MCG TOTAL) BY MOUTH 2 (TWO) TIMES DAILY. 01/15/20 01/14/21  Shirley Friar, PA-C  furosemide (LASIX) 40 MG tablet Take 1 tablet (40 mg total) by mouth daily. Patient taking differently: Take 40 mg by mouth daily as needed for edema. 02/14/20   Shirley Friar, PA-C  furosemide (LASIX) 40 MG tablet TAKE 1 TABLET (40 MG TOTAL) BY MOUTH DAILY. 01/15/20 01/14/21  Shirley Friar, PA-C  losartan (COZAAR) 25 MG tablet Take 0.5 tablets (12.5 mg total) by mouth daily. 06/13/20   [name], [name], MD  omeprazole (PRILOSEC) 10 MG capsule TAKE ONE CAPSULE BY MOUTH ONE TIME DAILY 07/06/20   [name], [name], MD  polyethylene glycol (MIRALAX / GLYCOLAX) packet Take 17 g by mouth as needed for mild constipation.     [provider]  potassium chloride SA (KLOR-CON) 20 MEQ tablet Take 60 mEq by mouth daily as needed (Takes with lasix).  01/19/20   [provider]  potassium chloride SA (KLOR-CON) 20 MEQ tablet TAKE 1 TABLET (20 MEQ TOTAL) BY MOUTH DAILY. 01/15/20 01/14/21  Shirley Friar, PA-C  rosuvastatin (CRESTOR) 10 MG tablet Take 1 tablet (10 mg total) by mouth daily. 05/03/16   Chipper Herb, MD  tamsulosin (FLOMAX) 0.4 MG CAPS capsule Take 1 capsule (0.4 mg total) by mouth daily. 06/13/20   [name], [name], MD    Physical Exam: Vitals:   09/05/20 1400 09/05/20 1430 09/05/20 1500 09/05/20 1530  BP: (!) 119/44 (!) 120/47 (!) 127/49 (!) 120/52  Pulse: (!) 56 66 69 69  Resp: 19 (!) 23 (!) 21 (!) 23  Temp:      TempSrc:      SpO2: 100% 100% 100% 100%  Weight:  Height:        Constitutional: NAD, calm, comfortable Vitals:   09/05/20 1400 09/05/20 1430 09/05/20 1500 09/05/20 1530  BP: (!) 119/44 (!) 120/47 (!) 127/49 (!) 120/52  Pulse: (!) 56 66 69 69  Resp: 19 (!) 23 (!) 21 (!) 23  Temp:      TempSrc:      SpO2: 100% 100% 100% 100%  Weight:      Height:       Eyes: PERRL, lids and conjunctivae normal ENMT: Mucous membranes are moist.  Neck: normal, supple, no masses, no thyromegaly Respiratory: clear to auscultation bilaterally, no wheezing, no crackles. Normal respiratory effort. No accessory muscle use.  Cardiovascular: Regular rate and rhythm, no murmurs / rubs / gallops.  1+ pitting extremity edema to mid leg, with chronic stasis changes to bilateral legs,  2+ pedal pulses. Abdomen: no tenderness, no masses palpated. No hepatosplenomegaly. Bowel sounds positive.  Musculoskeletal: no clubbing / cyanosis. No joint deformity upper and lower extremities. Good ROM, no contractures. Normal muscle tone.  Skin: Venous stasis skin changes to legs, no rashes, lesions, ulcers. No induration Neurologic: No apparent cranial nerve abnormality, strength 5/5 in all 4.  Psychiatric: Normal judgment and insight. Alert and oriented x 3. Normal mood.   Labs on Admission: I have personally reviewed  following labs and imaging studies  CBC: Recent Labs  Lab 09/05/20 0926 09/05/20 1554  WBC 4.6  --   NEUTROABS 3.7  --   HGB 13.4 13.1  HCT 44.3 43.7  MCV 106.5*  --   PLT 110*  --    Basic Metabolic Panel: Recent Labs  Lab 09/05/20 0926  NA 140  K 4.7  CL 92*  CO2 47*  GLUCOSE 95  BUN 27*  CREATININE 1.03  CALCIUM 8.5*   Liver Function Tests: Recent Labs  Lab 09/05/20 0926  AST 21  ALT 16  ALKPHOS 50  BILITOT 1.2  PROT 6.9  ALBUMIN 3.3*    Urine analysis:    Component Value Date/Time   COLORURINE YELLOW 01/14/2015 1112   APPEARANCEUR Clear 10/29/2019 0924   LABSPEC 1.019 01/14/2015 1112   PHURINE 5.0 01/14/2015 1112   GLUCOSEU Negative 10/29/2019 0924   HGBUR LARGE (A) 01/14/2015 1112   BILIRUBINUR Negative 10/29/2019 0924   KETONESUR NEGATIVE 01/14/2015 1112   PROTEINUR 2+ (A) 10/29/2019 0924   PROTEINUR 100 (A) 01/14/2015 1112   UROBILINOGEN negative 02/23/2015 1253   NITRITE Negative 10/29/2019 0924   NITRITE NEGATIVE 01/14/2015 1112   LEUKOCYTESUR Negative 10/29/2019 0924    Radiological Exams on Admission: CT ABDOMEN PELVIS W CONTRAST  Result Date: 09/05/2020 CLINICAL DATA:  LEFT upper quadrant abdominal pain in a 75 year old male. EXAM: CT ABDOMEN AND PELVIS WITH CONTRAST TECHNIQUE: Multidetector CT imaging of the abdomen and pelvis was performed using the standard protocol following bolus administration of intravenous contrast. CONTRAST:  52mL OMNIPAQUE IOHEXOL 300 MG/ML SOLN, 6mL OMNIPAQUE IOHEXOL 300 MG/ML SOLN, 16mL OMNIPAQUE IOHEXOL 300 MG/ML SOLN COMPARISON:  Comparison made with November of 2016. FINDINGS: Lower chest: Chronic pleural scarring and sequela of chronic effusions with peripheral calcification showing no change since 2016. Associated basilar scarring and rounded atelectasis with similar appearance. Signs of mitral annular calcification and coronary artery disease. Heart size remains enlarged. Hepatobiliary: Signs of liver disease  with marked nodularity of hepatic contour and fissural widening and retraction showing LEFT lobe hypertrophy and RIGHT lobe atrophy. No visible lesion on venous phase with patent portal vein. Pancreas: Normal, without mass, inflammation  or ductal dilatation. Spleen: Subtle low attenuation along the margin of the spleen with vaguely wedge-shaped morphologic features. This measures approximately 8-9 mm size. More wedge-shaped on coronal images. Adrenals/Urinary Tract: Normal adrenal glands. RIGHT renal cyst in the lower pole measuring approximately 11 mm. No suspicious renal lesion or sign of hydronephrosis. No ureteral calculi or ureteral dilation. Small renal cyst arises from the interpolar LEFT kidney as well. Stomach/Bowel: No signs of acute gastrointestinal process. Stomach under distended. No evidence of small bowel dilation. The appendix is normal. Stool fills much of the colon. Vascular/Lymphatic: Calcified atheromatous plaque of the abdominal aorta without aneurysmal dilation. Noncalcified plaque as well. There is no gastrohepatic or hepatoduodenal ligament lymphadenopathy. No retroperitoneal or mesenteric lymphadenopathy. No pelvic sidewall lymphadenopathy. Reproductive: Unremarkable. Other: Moderate bilateral fat containing inguinal hernias. Signs of ascites in the pelvis. Small volume simple fluid. No free air. Musculoskeletal: Spinal degenerative changes. No acute or destructive bone process. IMPRESSION: 1. Morphologic features of cirrhosis. 2. Small splenic infarct. 3. Small volume ascites likely related to liver disease. 4. Chronic calcified pleural effusions/pleural scarring and rounded atelectasis without significant change since 2016. 5. Bilateral fat containing inguinal hernias of moderate size. 6. Aortic atherosclerosis. Aortic Atherosclerosis (ICD10-I70.0). Electronically Signed   By: Zetta Bills M.D.   On: 09/05/2020 13:25   DG Chest Port 1 View  Result Date: 09/05/2020 CLINICAL DATA:   Shortness of breath EXAM: PORTABLE CHEST 1 VIEW COMPARISON:  05/11/2019 FINDINGS: Normal heart size. Mitral annular calcification. Small pleural effusions with pleural thickening and calcification by 2012 CT. No acute opacity when compared with prior. IMPRESSION: 1. No acute finding when compared with priors. 2. Chronic cardiomegaly, pleural thickening and small pleural effusions. Electronically Signed   By: Monte Fantasia M.D.   On: 09/05/2020 09:49    EKG: Independently reviewed.  Sinus rhythm rate 71, QTc prolonged at 547.  Artifacts present.  No significant change from prior.  Assessment/Plan Principal Problem:   Rectal bleeding Active Problems:   Hypertrophic obstructive cardiomyopathy (HCC)   COPD (chronic obstructive pulmonary disease) (HCC)   PAF (paroxysmal atrial fibrillation) (HCC)   Hepatic cirrhosis (HCC)   Long term (current) use of anticoagulants [Z79.01]   Rectal bleeding-hemoglobin stable at 13 x 2.  Vitals stable.  On Eliquis.  CT shows not significant for etiology of bleed.  No bloody bowel movements today.    - LAst Colonoscopy 2018- A 6 mm sessile polyp was found in the transverse colon. The polyp was sessile.  Internal hemorrhoids were found during retroflexion. The hemorrhoids were medium-sized and Grade I (internal hemorrhoids that do not prolapse). -Repeat CBC in the morning -Hold Eliquis for now  HOCM-last echo 09/2019, EF 60 to 65%, severe concentric left ventricular hypertrophy.  Today BNP is elevated at 422, from 290, with 2 days of +1 bilateral lower extremity swelling.  Weights per chart stable.No change in respiratory status, chest x-ray without acute abnormality.   -IV Lasix 60 mg x 1 given -Further dosing of Lasix pending clinical course -Home Lasix 40 mg daily held for now - Obtain updated ECHO  Prolonged QTC repeat EKG shows QTC of 518, with old RBBB.  Prior EKGs show normal QTC's. -Check magnesium -Resume Tikosyn, but hold other QT prolonging  medications -Consider curbside cards consult as patient is on Tikosyn but prior EKGs while on medication showed normal QTC - EKG a.m  Small splenic infarct-seen on CT.  Incidental finding.  Patient is on Eliquis.  Splenic infarct in the setting of liver  cirrhosis. -Hold anticoagulation for now due to possible GI bleed  COPD with chronic respiratory failure on 3 L.  Stable.  Paroxysmal atrial fibrillation on long-term anticoagulation -Hold Eliquis with possible GI bleed  Liver cirrhosis  HTN- Stable -Resume losartan,  DVT prophylaxis: SCDs Code Status:   Full code Family Communication: None at bedside Disposition Plan:  ~ 1-2 days Consults called:  None Admission status: Obs tele I certify that at the point of admission it is my clinical judgment that the patient will require inpatient hospital care spanning beyond 2 midnights from the point of admission due to high intensity of service, high risk for further deterioration and high frequency of surveillance required.    Bethena Roys MD [facility]  09/05/2020, 8:21 PM

## 2020-09-05 NOTE — ED Notes (Signed)
Pt returned for CT

## 2020-09-05 NOTE — ED Triage Notes (Signed)
Patient states he was having issues with constipation and took some pepto bismol and had large bowel movement yesterday. Patient states he tried having another bowel movement this morning and noticed bright red blood in toilet. Patient denies rectal pain but states hardened abdomen.

## 2020-09-05 NOTE — ED Notes (Signed)
Pt found in room attempting to take his scheduled  Home meds during nurses rounds. Nurse explained to pt to not take any home meds, let the Dr order what she wants him to have to make sure there are no contraindications in meds. Pt stated " ok but they better get on the ball because it is time for my meds. " Nurse explained mediatation my be a little different the at home but pharmacy needed to go through med list and things. Pt stated " pharmacy needs to get off their hind end and get to it".

## 2020-09-05 NOTE — ED Notes (Signed)
O2 87-90% while ambulating on 3L, pt reported feeling dizzy while ambulating.

## 2020-09-05 NOTE — ED Provider Notes (Signed)
[facility] EMERGENCY DEPARTMENT Provider Note   CSN: [id number] Arrival date & time: 09/05/20  0850     History Chief Complaint  Patient presents with   Rectal Bleeding    Joshua [name] is a 75 y.o. male presenting with primary chief complaint of lower GI bleed along with persistent cramping left-sided abdominal pain.  He reports taking Pepto-Bismol yesterday after having some concerns of constipation, after this he had a large bowel movement which was accompanied by bright red blood along with blood clots.  Since then he has had a very small bowel movement this a.m. with some bright red blood on the toilet tissue.  He denies fevers or chills, no nausea or vomiting.  He also denies rectal pain or history of hemorrhoids.  Distant history of colonoscopy, no known History of diverticulitis.  Of note, chart revealing colonoscopy in 2018 with mild internal hemorrhoids only.  He does endorse some abdominal distention.  He also reports he has chronic shortness of breath which has been worsened since yesterday's GI event along with increased ankle edema which has not responded to an additional dose of his Lasix yesterday.   He has a history of hypertrophic cardiomyopathy, CAD, COPD and is chronically on home oxygen at 3 L.   He denies chest pain, orthopnea, fever or cough.  Past medical history is also significant for iron deficiency anemia, cirrhosis, paroxysmal atrial flutter with history of cardioversion.  Currently treated with Eliquis.  The history is provided by the patient.      Past Medical History:  Diagnosis Date   Acute lower GI bleeding    related to gastritis / viral infection   Cataract    Cirrhosis ([place])    COPD (chronic obstructive pulmonary disease) ([place])    PFT 05-05-2010, FEV1 .9 (26%) ratio 60   Coronary artery disease    Gastritis 06-10-2006   Hiatal hernia 06-10-2006   EGD   HOH (hard of hearing)    Hx of adenomatous colonic polyps 2005, 2013   Colonoscopy    Hypercholesteremia    Hypertension    Hypertr obst cardiomyop    mild subaortic stenosis   Iron deficiency anemia, unspecified    LVH (left ventricular hypertrophy)    Other B-complex deficiencies    Persistent atrial fibrillation (HCC)    persistent   Rheumatic fever    Thrombocytopenia ([place]) 05/2008    Patient Active Problem List   Diagnosis Date Noted   Adenomatous polyps 12/13/2016   Long term (current) use of anticoagulants [Z79.01] 06/14/2016   CAD in native artery 01/17/2016   HOCM (hypertrophic obstructive cardiomyopathy) ([place]) 07/16/2015   Hepatic cirrhosis ([place])    Syncope and collapse 01/14/2015   Erectile dysfunction 11/10/2014   Thrombocytopenia ([place]) 01/30/2014   Hyperlipidemia LDL goal <70 12/03/2013   High risk medication use 05/21/2013   BPH (benign prostatic hyperplasia) 01/29/2013   Family history of colon cancer 01/29/2013   PAF (paroxysmal atrial fibrillation) ([place]) 12/17/2012   Aortic valve insufficiency 12/17/2012   Mild mitral stenosis 12/17/2012   COPD (chronic obstructive pulmonary disease) ([place]) 05/05/2010   Hypertrophic obstructive cardiomyopathy ([place]) 03/31/2010   B12 deficiency 10/11/2008   ANEMIA, IRON DEFICIENCY 10/11/2008   Coronary atherosclerosis 10/11/2008   HIATAL HERNIA WITH REFLUX 10/11/2008   COLONIC POLYPS, ADENOMATOUS, HX OF 10/11/2008    Past Surgical History:  Procedure Laterality Date   ATRIAL FLUTTER ABLATION  06/2010   CTI ablation performed by [name]   CARDIAC CATHETERIZATION  05/28/2005  Widely patent coronaries and normal LV function.   CARDIAC CATHETERIZATION  08/11/2001   80% LAD stenosis successfully stented with a 3.5x76mm Cypher DES postdilated to 3.39mm resulting in reduction of 80% to 0%.   CARDIAC CATHETERIZATION  01/15/2001   Focal 95% proximal RCA stenosis, stented with a 3x50mm Zeta multilink stent with postdilatation utilizing a 3.5x82mm Quantum balloon with stenosis being reduced to 0%.   CARDIOVASCULAR  STRESS TEST  05/29/2011   No scintigraphic evidence of inducible myocardial ischemia. No ECG changes. EKG negative for ischemia.   CARDIOVERSION N/A 10/20/2019   Procedure: CARDIOVERSION;  Surgeon: Sanda Klein, MD;  Location: [facility];  Service: Cardiovascular;  Laterality: N/A;   CARDIOVERSION N/A 01/14/2020   Procedure: CARDIOVERSION;  Surgeon: Buford Dresser, MD;  Location: [facility] ENDOSCOPY;  Service: Cardiovascular;  Laterality: N/A;   CORONARY ANGIOPLASTY     2002-2003   SHOULDER SURGERY     TRANSTHORACIC ECHOCARDIOGRAM  05/29/2011   EF >70%, severe concentric LV hypertrophy, severe mitral annular calcification, mild-moderate aortic regurg,        Family History  Problem Relation Age of Onset   Heart attack Brother 82       Ventircular rupture   Emphysema Brother    Lung cancer Brother    Heart disease Mother        Enlarged heart   Stroke Mother    Hyperlipidemia Father    CAD Father    Hypertension Sister    CAD Paternal Uncle    Stroke Paternal Uncle    Cancer Maternal Grandfather        colon   Colon cancer Maternal Grandfather    Asthma Brother     Social History   Tobacco Use   Smoking status: Former    Packs/day: 1.00    Years: 10.00    Pack years: 10.00    Types: Cigarettes    Quit date: 02/26/1985    Years since quitting: 35.5   Smokeless tobacco: Never  Vaping Use   Vaping Use: Never used  Substance Use Topics   Alcohol use: No   Drug use: No    Home Medications Prior to Admission medications   Medication Sig Start Date End Date Taking? Authorizing Provider  apixaban (ELIQUIS) 5 MG TABS tablet Take 1 tablet (5 mg total) by mouth 2 (two) times daily. 12/10/19   Allred, Jeneen Rinks, MD  cholecalciferol (VITAMIN D) 1000 units tablet Take 1,000 Units by mouth daily.    [provider]  cyanocobalamin ([employer] VITAMIN B12) 1000 MCG tablet Take 1 tablet (1,000 mcg total) by mouth daily. 11/24/14   Cherre Robins, PharmD  dofetilide (TIKOSYN) 500  MCG capsule Take 1 capsule (500 mcg total) by mouth 2 (two) times daily. 02/14/20   Shirley Friar, PA-C  dofetilide (TIKOSYN) 500 MCG capsule TAKE 1 CAPSULE (500 MCG TOTAL) BY MOUTH 2 (TWO) TIMES DAILY. 01/15/20 01/14/21  Shirley Friar, PA-C  furosemide (LASIX) 40 MG tablet Take 1 tablet (40 mg total) by mouth daily. Patient taking differently: Take 40 mg by mouth daily as needed for edema. 02/14/20   Shirley Friar, PA-C  furosemide (LASIX) 40 MG tablet TAKE 1 TABLET (40 MG TOTAL) BY MOUTH DAILY. 01/15/20 01/14/21  Shirley Friar, PA-C  losartan (COZAAR) 25 MG tablet Take 0.5 tablets (12.5 mg total) by mouth daily. 06/13/20   [name], [name], MD  omeprazole (PRILOSEC) 10 MG capsule TAKE ONE CAPSULE BY MOUTH ONE TIME DAILY 07/06/20   [name], [name],  MD  polyethylene glycol (MIRALAX / GLYCOLAX) packet Take 17 g by mouth as needed for mild constipation.     [provider]  potassium chloride SA (KLOR-CON) 20 MEQ tablet Take 60 mEq by mouth daily as needed (Takes with lasix). 01/19/20   [provider]  potassium chloride SA (KLOR-CON) 20 MEQ tablet TAKE 1 TABLET (20 MEQ TOTAL) BY MOUTH DAILY. 01/15/20 01/14/21  Shirley Friar, PA-C  rosuvastatin (CRESTOR) 10 MG tablet Take 1 tablet (10 mg total) by mouth daily. 05/03/16   Chipper Herb, MD  tamsulosin (FLOMAX) 0.4 MG CAPS capsule Take 1 capsule (0.4 mg total) by mouth daily. 06/13/20   [name], [name], MD    Allergies    Patient has no known allergies.  Review of Systems   Review of Systems  Constitutional:  Negative for chills and fever.  HENT:  Negative for congestion and sore throat.   Eyes: Negative.   Respiratory:  Positive for shortness of breath. Negative for chest tightness.   Cardiovascular:  Positive for leg swelling. Negative for chest pain.  Gastrointestinal:  Positive for abdominal distention, abdominal pain, blood in stool and constipation. Negative for  nausea, rectal pain and vomiting.  Genitourinary: Negative.   Musculoskeletal:  Negative for arthralgias, joint swelling and neck pain.  Skin: Negative.  Negative for rash and wound.  Neurological:  Positive for weakness. Negative for dizziness, light-headedness, numbness and headaches.  Psychiatric/Behavioral: Negative.     Physical Exam Updated Vital Signs BP (!) 120/52   Pulse 69   Temp 98.1 F (36.7 C) (Oral)   Resp (!) 23   Ht 5\' 7"  (1.702 m)   Wt 80 kg   SpO2 100%   BMI 27.62 kg/m   Physical Exam Vitals and nursing note reviewed.  Constitutional:      Appearance: He is well-developed.  HENT:     Head: Normocephalic and atraumatic.  Eyes:     Conjunctiva/sclera: Conjunctivae normal.  Cardiovascular:     Rate and Rhythm: Normal rate and regular rhythm.     Heart sounds: Normal heart sounds. No murmur heard. Pulmonary:     Effort: Pulmonary effort is normal.     Breath sounds: Normal breath sounds. No wheezing, rhonchi or rales.  Abdominal:     General: Bowel sounds are normal. There is distension.     Palpations: Abdomen is soft.     Tenderness: There is abdominal tenderness in the left upper quadrant. There is no guarding or rebound.     Comments: Abdomen is soft, no increased tympany.  Musculoskeletal:        General: Normal range of motion.     Cervical back: Normal range of motion.     Right lower leg: Edema present.     Left lower leg: Edema present.  Skin:    General: Skin is warm and dry.  Neurological:     Mental Status: He is alert.    ED Results / Procedures / Treatments   Labs (all labs ordered are listed, but only abnormal results are displayed) Labs Reviewed  CBC WITH DIFFERENTIAL/PLATELET - Abnormal; Notable for the following components:      Result Value   RBC 4.16 (*)    MCV 106.5 (*)    Platelets 110 (*)    Lymphs Abs 0.5 (*)    All other components within normal limits  COMPREHENSIVE METABOLIC PANEL - Abnormal; Notable for the  following components:   Chloride 92 (*)  CO2 47 (*)    BUN 27 (*)    Calcium 8.5 (*)    Albumin 3.3 (*)    Anion gap <3 (*)    All other components within normal limits  BRAIN NATRIURETIC PEPTIDE - Abnormal; Notable for the following components:   B Natriuretic Peptide 422.0 (*)    All other components within normal limits  POC OCCULT BLOOD, ED - Abnormal; Notable for the following components:   Fecal Occult Bld POSITIVE (*)    All other components within normal limits  TROPONIN I (HIGH SENSITIVITY) - Abnormal; Notable for the following components:   Troponin I (High Sensitivity) 63 (*)    All other components within normal limits  TROPONIN I (HIGH SENSITIVITY) - Abnormal; Notable for the following components:   Troponin I (High Sensitivity) 63 (*)    All other components within normal limits  HEMOGLOBIN AND HEMATOCRIT, BLOOD  SAMPLE TO BLOOD BANK    EKG EKG Interpretation  Date/Time:  Monday September 05 2020 08:54:59 EDT Ventricular Rate:  71 PR Interval:    QRS Duration: 157 QT Interval:  503 QTC Calculation: 547 R Axis:   227 Text Interpretation: Undetermined rhythm Nonspecific intraventricular conduction delay Lateral infarct, old Artifact in lead(s) I II III aVR aVL aVF Confirmed by Nanda Quinton [phone]) on 09/05/2020 10:08:47 AM  Radiology CT ABDOMEN PELVIS W CONTRAST  Result Date: 09/05/2020 CLINICAL DATA:  LEFT upper quadrant abdominal pain in a 75 year old male. EXAM: CT ABDOMEN AND PELVIS WITH CONTRAST TECHNIQUE: Multidetector CT imaging of the abdomen and pelvis was performed using the standard protocol following bolus administration of intravenous contrast. CONTRAST:  60mL OMNIPAQUE IOHEXOL 300 MG/ML SOLN, 39mL OMNIPAQUE IOHEXOL 300 MG/ML SOLN, 68mL OMNIPAQUE IOHEXOL 300 MG/ML SOLN COMPARISON:  Comparison made with November of 2016. FINDINGS: Lower chest: Chronic pleural scarring and sequela of chronic effusions with peripheral calcification showing no change since  2016. Associated basilar scarring and rounded atelectasis with similar appearance. Signs of mitral annular calcification and coronary artery disease. Heart size remains enlarged. Hepatobiliary: Signs of liver disease with marked nodularity of hepatic contour and fissural widening and retraction showing LEFT lobe hypertrophy and RIGHT lobe atrophy. No visible lesion on venous phase with patent portal vein. Pancreas: Normal, without mass, inflammation or ductal dilatation. Spleen: Subtle low attenuation along the margin of the spleen with vaguely wedge-shaped morphologic features. This measures approximately 8-9 mm size. More wedge-shaped on coronal images. Adrenals/Urinary Tract: Normal adrenal glands. RIGHT renal cyst in the lower pole measuring approximately 11 mm. No suspicious renal lesion or sign of hydronephrosis. No ureteral calculi or ureteral dilation. Small renal cyst arises from the interpolar LEFT kidney as well. Stomach/Bowel: No signs of acute gastrointestinal process. Stomach under distended. No evidence of small bowel dilation. The appendix is normal. Stool fills much of the colon. Vascular/Lymphatic: Calcified atheromatous plaque of the abdominal aorta without aneurysmal dilation. Noncalcified plaque as well. There is no gastrohepatic or hepatoduodenal ligament lymphadenopathy. No retroperitoneal or mesenteric lymphadenopathy. No pelvic sidewall lymphadenopathy. Reproductive: Unremarkable. Other: Moderate bilateral fat containing inguinal hernias. Signs of ascites in the pelvis. Small volume simple fluid. No free air. Musculoskeletal: Spinal degenerative changes. No acute or destructive bone process. IMPRESSION: 1. Morphologic features of cirrhosis. 2. Small splenic infarct. 3. Small volume ascites likely related to liver disease. 4. Chronic calcified pleural effusions/pleural scarring and rounded atelectasis without significant change since 2016. 5. Bilateral fat containing inguinal hernias of  moderate size. 6. Aortic atherosclerosis. Aortic Atherosclerosis (ICD10-I70.0). Electronically Signed  By: Zetta Bills M.D.   On: 09/05/2020 13:25   DG Chest Port 1 View  Result Date: 09/05/2020 CLINICAL DATA:  Shortness of breath EXAM: PORTABLE CHEST 1 VIEW COMPARISON:  05/11/2019 FINDINGS: Normal heart size. Mitral annular calcification. Small pleural effusions with pleural thickening and calcification by 2012 CT. No acute opacity when compared with prior. IMPRESSION: 1. No acute finding when compared with priors. 2. Chronic cardiomegaly, pleural thickening and small pleural effusions. Electronically Signed   By: Monte Fantasia M.D.   On: 09/05/2020 09:49    Procedures Procedures   Medications Ordered in ED Medications  furosemide (LASIX) injection 60 mg (has no administration in time range)  iohexol (OMNIPAQUE) 300 MG/ML solution 100 mL (25 mLs Intravenous Contrast Given 09/05/20 1246)  iohexol (OMNIPAQUE) 300 MG/ML solution 25 mL (25 mLs Intravenous Contrast Given 09/05/20 1247)  iohexol (OMNIPAQUE) 300 MG/ML solution 25 mL (25 mLs Intravenous Contrast Given 09/05/20 1247)    ED Course  I have reviewed the triage vital signs and the nursing notes.  Pertinent labs & imaging results that were available during my care of the patient were reviewed by me and considered in my medical decision making (see chart for details).    MDM Rules/Calculators/A&P                          Patient presenting with a chief complaint of GI bleeding after and since a large constipated bowel movement which appears stable at this time although he does continue to have a lightly Hemoccult positive stool test here, his initial hemoglobin followed up by an H&H later in his visit remained stable with hemoglobin in the 13.1-13.4 range.  He also has complaints of increasing shortness of breath and peripheral edema since yesterday's GI bleeding event.  His chest x-ray is stable but his BNP is elevated at 422 along  with peripheral edema with known HOCM.  He was given Lasix 60 mg IV here.  He also had an initial troponin of 63 which remained unchanged with his 2 hour marker, he denies chest pain.   Patient was ambulated in the department and he became significantly weak and his oxygen dropped to 87% on 3 L which is his home baseline oxygen requirement.  Clinically sx suggest CHF, esp with elevated BNP.  He will require admission given hypoxia.  Call placed to hospitalist for admission.  Discussed with Dr. Denton Brick who accepts pt for admission.    final Clinical Impression(s) / ED Diagnoses Final diagnoses:  Acute congestive heart failure, unspecified heart failure type [facility])  Rectal bleeding    Rx / DC Orders ED Discharge Orders     None        Landis Martins 09/05/20 1639    Long, Wonda Olds, MD 09/12/20 2215

## 2020-09-06 DIAGNOSIS — Z7901 Long term (current) use of anticoagulants: Secondary | ICD-10-CM | POA: Diagnosis not present

## 2020-09-06 DIAGNOSIS — D735 Infarction of spleen: Secondary | ICD-10-CM | POA: Diagnosis not present

## 2020-09-06 DIAGNOSIS — Z20822 Contact with and (suspected) exposure to covid-19: Secondary | ICD-10-CM | POA: Diagnosis not present

## 2020-09-06 DIAGNOSIS — I421 Obstructive hypertrophic cardiomyopathy: Secondary | ICD-10-CM | POA: Diagnosis not present

## 2020-09-06 DIAGNOSIS — J449 Chronic obstructive pulmonary disease, unspecified: Secondary | ICD-10-CM | POA: Diagnosis not present

## 2020-09-06 DIAGNOSIS — I110 Hypertensive heart disease with heart failure: Secondary | ICD-10-CM | POA: Diagnosis not present

## 2020-09-06 DIAGNOSIS — I480 Paroxysmal atrial fibrillation: Secondary | ICD-10-CM | POA: Diagnosis not present

## 2020-09-06 DIAGNOSIS — K625 Hemorrhage of anus and rectum: Secondary | ICD-10-CM

## 2020-09-06 DIAGNOSIS — K7460 Unspecified cirrhosis of liver: Secondary | ICD-10-CM

## 2020-09-06 DIAGNOSIS — I5021 Acute systolic (congestive) heart failure: Secondary | ICD-10-CM | POA: Diagnosis not present

## 2020-09-06 DIAGNOSIS — I4581 Long QT syndrome: Secondary | ICD-10-CM | POA: Diagnosis not present

## 2020-09-06 DIAGNOSIS — K922 Gastrointestinal hemorrhage, unspecified: Secondary | ICD-10-CM | POA: Diagnosis not present

## 2020-09-06 DIAGNOSIS — I2510 Atherosclerotic heart disease of native coronary artery without angina pectoris: Secondary | ICD-10-CM | POA: Diagnosis not present

## 2020-09-06 LAB — CBC
HCT: 41.7 % (ref 39.0–52.0)
Hemoglobin: 12.7 g/dL — ABNORMAL LOW (ref 13.0–17.0)
MCH: 32.2 pg (ref 26.0–34.0)
MCHC: 30.5 g/dL (ref 30.0–36.0)
MCV: 105.6 fL — ABNORMAL HIGH (ref 80.0–100.0)
Platelets: 109 10*3/uL — ABNORMAL LOW (ref 150–400)
RBC: 3.95 MIL/uL — ABNORMAL LOW (ref 4.22–5.81)
RDW: 13.8 % (ref 11.5–15.5)
WBC: 4.9 10*3/uL (ref 4.0–10.5)
nRBC: 0 % (ref 0.0–0.2)

## 2020-09-06 LAB — BASIC METABOLIC PANEL
Anion gap: 9 (ref 5–15)
BUN: 30 mg/dL — ABNORMAL HIGH (ref 8–23)
CO2: 42 mmol/L — ABNORMAL HIGH (ref 22–32)
Calcium: 8.7 mg/dL — ABNORMAL LOW (ref 8.9–10.3)
Chloride: 90 mmol/L — ABNORMAL LOW (ref 98–111)
Creatinine, Ser: 1.13 mg/dL (ref 0.61–1.24)
GFR, Estimated: >60 mL/min (ref >60)
Glucose, Bld: 84 mg/dL (ref 70–99)
Potassium: 4.3 mmol/L (ref 3.5–5.1)
Sodium: 141 mmol/L (ref 135–145)

## 2020-09-06 MED ORDER — LOSARTAN POTASSIUM 25 MG PO TABS: 25.0000 mg | ORAL_TABLET | Freq: Every day | ORAL | Status: DC

## 2020-09-06 MED ORDER — SENNA 8.6 MG PO TABS: 1.0000 | ORAL_TABLET | Freq: Every day | ORAL | 0 refills | Status: DC

## 2020-09-06 MED ORDER — HYDROCORTISONE ACETATE 25 MG RE SUPP: 25.0000 mg | Freq: Two times a day (BID) | RECTAL | 0 refills | Status: DC

## 2020-09-06 NOTE — Progress Notes (Signed)
Nsg Discharge Note  Admit Date:  09/05/2020 Discharge date: 09/06/2020   [name] to be D/C'd Home per MD order.  AVS completed.  Copy for chart, and copy for patient signed, and dated. Patient/caregiver able to verbalize understanding.  Discharge Medication: Allergies as of 09/06/2020   No Known Allergies      Medication List     TAKE these medications    apixaban 5 MG Tabs tablet Commonly known as: ELIQUIS Take 1 tablet (5 mg total) by mouth 2 (two) times daily.   cholecalciferol 1000 units tablet Commonly known as: VITAMIN D Take 1,000 Units by mouth daily.   cyanocobalamin 1000 MCG tablet Commonly known as: [facility]S VITAMIN B12 Take 1 tablet (1,000 mcg total) by mouth daily.   dofetilide 500 MCG capsule Commonly known as: Tikosyn Take 1 capsule (500 mcg total) by mouth 2 (two) times daily. What changed: Another medication with the same name was removed. Continue taking this medication, and follow the directions you see here.   furosemide 40 MG tablet Commonly known as: LASIX TAKE 1 TABLET (40 MG TOTAL) BY MOUTH DAILY. What changed: Another medication with the same name was removed. Continue taking this medication, and follow the directions you see here.   hydrocortisone 25 MG suppository Commonly known as: ANUSOL-HC Place 1 suppository (25 mg total) rectally every 12 (twelve) hours for 7 days.   losartan 25 MG tablet Commonly known as: COZAAR Take 1 tablet (25 mg total) by mouth daily. What changed: how much to take   omeprazole 10 MG capsule Commonly known as: PRILOSEC TAKE ONE CAPSULE BY MOUTH ONE TIME DAILY   polyethylene glycol 17 g packet Commonly known as: MIRALAX / GLYCOLAX Take 17 g by mouth as needed for mild constipation.   potassium chloride SA 20 MEQ tablet Commonly known as: KLOR-CON TAKE 1 TABLET (20 MEQ TOTAL) BY MOUTH DAILY.   rosuvastatin 10 MG tablet Commonly known as: CRESTOR Take 1 tablet (10 mg total) by mouth daily.   senna 8.6  MG Tabs tablet Commonly known as: SENOKOT Take 1 tablet (8.6 mg total) by mouth daily.   tamsulosin 0.4 MG Caps capsule Commonly known as: FLOMAX Take 1 capsule (0.4 mg total) by mouth daily.        Discharge Assessment: Vitals:   09/06/20 1105 09/06/20 1422  BP: (!) 113/39 (!) 103/53  Pulse: 86 98  Resp: 18 18  Temp: 98.1 F (36.7 C) 97.9 F (36.6 C)  SpO2: 92% 92%   Skin clean, dry and intact without evidence of skin break down, no evidence of skin tears noted. IV catheter discontinued intact. Site without signs and symptoms of complications - no redness or edema noted at insertion site, patient denies c/o pain - only slight tenderness at site.  Dressing with slight pressure applied.  D/c Instructions-Education: Discharge instructions given to patient/family with verbalized understanding. D/c education completed with patient/family including follow up instructions, medication list, d/c activities limitations if indicated, with other d/c instructions as indicated by MD - patient able to verbalize understanding, all questions fully answered. Patient instructed to return to ED, call 911, or call MD for any changes in condition.  Patient escorted via [place], and D/C home via private auto.  Dorcas Mcmurray, RN 09/06/2020 5:25 PM

## 2020-09-06 NOTE — Progress Notes (Signed)
EKG done and given to nurse 

## 2020-09-06 NOTE — Discharge Summary (Signed)
Physician Discharge Summary  Joshua [name] PJA:[id number] DOB: 1945/11/13 DOA: 09/05/2020  PCP: [name], [name], MD  Admit date: 09/05/2020 Discharge date: 09/06/2020  Time spent: 35 minutes  Recommendations for Outpatient Follow-up:  Repeat CBC to follow hemoglobin trend Repeat basic metabolic panel to follow twice renal function Outpatient follow-up with cardiology and GI service recommended.     Discharge Diagnoses:  Principal Problem:   Rectal bleeding Active Problems:   COPD (chronic obstructive pulmonary disease) (HCC)   PAF (paroxysmal atrial fibrillation) (HCC)   Hepatic cirrhosis (HCC)   HOCM (hypertrophic obstructive cardiomyopathy) ([place])   Long term (current) use of anticoagulants [Z79.01]   Splenic infarct   Discharge Condition: Stable and improved.  Discharged home with instruction to follow-up with PCP, cardiology service and GI as an outpatient  CODE STATUS: Full code  Diet recommendation: Heart healthy/low-sodium diet.  Filed Weights   09/05/20 0853 09/06/20 0500  Weight: 80 kg 74.4 kg    History of present illness:  As per H&P written by Dr. Denton [name] on 09/05/2020 Joshua [name] is a 75 y.o. male with medical history significant for  HOCM, COPD, PAF on chronic anticoagulation, liver cirrhosis, hypertension, respiratory failure on 3 L. Presented to the ED with complaints of blood per rectum.  Patient reports yesterday he was constipated, so he took Pepto-Bismol and subsequently had a large bowel movement, with blood.  He reports lots of blood with clots yesterday.  Today he has not had a bowel movement, but when he wiped he saw blood again today.  He reports pain in the right side of his abdomen that has resolved.  No dizziness,  no chest pains. Patient reports chronic and unchanged difficulty breathing for 20 years, he is on chronic home O2.  He reports bilateral leg swelling that he noticed 2 days ago.  Reports compliance with his medications which include  Lasix 40 mg daily and Eliquis.   ED Course: Temperature 98.1.  Heart rate 50s to 70s, respiratory rate 13-28, blood pressure systolic ranging from 341-937T systolic.  BNP elevated at 422.  Hemoglobin stable at 13.4.  Abdominal CT shows a small splenic infarct, diverticulosis not noted.  Chest x-ray without acute findings, shows chronic cardiomegaly. 60 mg IV Lasix given.  Hospitalist to admit for rectal bleeding.  Hospital Course:  1-rectal bleeding -In the setting of internal hemorrhoids -No further episode of bleeding reported -Hemoglobin 12.7 -Patient will be treated with Anusol twice a day for 7 days -We will resume Eliquis -Advised not to use NSAIDs -Outpatient follow-up with GI. -Instructed to maintain adequate hydration and increase fiber intake. -As needed Senokot and MiraLAX have been ordered.  2-HOCM -No crackles or signs of fluid overload on examination -Patient feels breathing is back to baseline and denies orthopnea -Resume home diuretics -Instructed to follow low-sodium diet and check his weight on daily basis -Outpatient follow-up with cardiology service.  3-paroxysmal atrial fibrillation -stable rate -Continue Tikosyn -Resumed Eliquis for secondary prevention -Continue outpatient follow-up with cardiology service.  4-prolonged QT initially seen on EKG at time of admission -Stabilized and resolved on repeat tracing -Telemetry without acute or normality -Continue the use of Tikosyn -Outpatient follow-up with cardiology service. -Continue avoiding any other agents that can prolong QT.  5-prior history of liver cirrhosis without ascites -Continue outpatient follow-up with gastroenterology -LFTs within normal limits.  6-hypertension -Stable and well -controlled-continue losartan.  7-chronic respiratory failure with hypoxia secondary to COPD -No wheezing unable to speak in full sentences -Good oxygen saturation  on 3 L chronic supplementation -Continue home  bronchodilators and outpatient follow-up.  8-splenic infarct: Incidental finding -Continue current anticoagulation -Outpatient follow-up with GI service. -No abdominal pain.  Procedures: See below for x-ray reports.  Consultations: None  Discharge Exam: Vitals:   09/06/20 1105 09/06/20 1422  BP: (!) 113/39 (!) 103/53  Pulse: 86 98  Resp: 18 18  Temp: 98.1 F (36.7 C) 97.9 F (36.6 C)  SpO2: 92% 92%    General: Afebrile, no chest pain, no nausea, no vomiting, no shortness of breath.  No further episode of bleeding appreciated.  Feeling ready to go home. Cardiovascular: Positive systolic murmur, no rubs, no gallops, no JVD. Respiratory: Good air movement bilaterally, no wheezing, no crackles. Abdomen: Soft, nontender, positive bowel sounds, no guarding. Extremities: No cyanosis or clubbing.  Discharge Instructions   Discharge Instructions     (HEART FAILURE PATIENTS) Call MD:  Anytime you have any of the following symptoms: 1) 3 pound weight gain in 24 hours or 5 pounds in 1 week 2) shortness of breath, with or without a dry hacking cough 3) swelling in the hands, feet or stomach 4) if you have to sleep on extra pillows at night in order to breathe.   Complete by: As directed    Diet - low sodium heart healthy   Complete by: As directed    Discharge instructions   Complete by: As directed    Take medications as prescribed Maintain adequate hydration Follow-up with cardiology service in 2 weeks Follow-up with PCP in the next 10 days Follow heart healthy/low sodium diet and check your weight on daily basis.      Allergies as of 09/06/2020   No Known Allergies      Medication List     TAKE these medications    apixaban 5 MG Tabs tablet Commonly known as: ELIQUIS Take 1 tablet (5 mg total) by mouth 2 (two) times daily.   cholecalciferol 1000 units tablet Commonly known as: VITAMIN D Take 1,000 Units by mouth daily.   cyanocobalamin 1000 MCG  tablet Commonly known as: CVS VITAMIN B12 Take 1 tablet (1,000 mcg total) by mouth daily.   dofetilide 500 MCG capsule Commonly known as: Tikosyn Take 1 capsule (500 mcg total) by mouth 2 (two) times daily. What changed: Another medication with the same name was removed. Continue taking this medication, and follow the directions you see here.   furosemide 40 MG tablet Commonly known as: LASIX TAKE 1 TABLET (40 MG TOTAL) BY MOUTH DAILY. What changed: Another medication with the same name was removed. Continue taking this medication, and follow the directions you see here.   hydrocortisone 25 MG suppository Commonly known as: ANUSOL-HC Place 1 suppository (25 mg total) rectally every 12 (twelve) hours for 7 days.   losartan 25 MG tablet Commonly known as: COZAAR Take 1 tablet (25 mg total) by mouth daily. What changed: how much to take   omeprazole 10 MG capsule Commonly known as: PRILOSEC TAKE ONE CAPSULE BY MOUTH ONE TIME DAILY   polyethylene glycol 17 g packet Commonly known as: MIRALAX / GLYCOLAX Take 17 g by mouth as needed for mild constipation.   potassium chloride SA 20 MEQ tablet Commonly known as: KLOR-CON TAKE 1 TABLET (20 MEQ TOTAL) BY MOUTH DAILY.   rosuvastatin 10 MG tablet Commonly known as: CRESTOR Take 1 tablet (10 mg total) by mouth daily.   senna 8.6 MG Tabs tablet Commonly known as: SENOKOT Take 1 tablet (8.6 mg total)  by mouth daily.   tamsulosin 0.4 MG Caps capsule Commonly known as: FLOMAX Take 1 capsule (0.4 mg total) by mouth daily.       No Known Allergies  Follow-up Information     [name], [name], MD. Schedule an appointment as soon as possible for a visit in 10 day(s).   Specialties: Family Medicine, Cardiology Contact information: [place] [address] [phone]         Troy Sine, MD .   Specialty: Cardiology Contact information: [address] [place]  [address] [phone]         Thompson Grayer, MD .   Specialty: Cardiology Contact information: [place] [address] [phone]                 The results of significant diagnostics from this hospitalization (including imaging, microbiology, ancillary and laboratory) are listed below for reference.    Significant Diagnostic Studies: CT ABDOMEN PELVIS W CONTRAST  Result Date: 09/05/2020 CLINICAL DATA:  LEFT upper quadrant abdominal pain in a 75 year old male. EXAM: CT ABDOMEN AND PELVIS WITH CONTRAST TECHNIQUE: Multidetector CT imaging of the abdomen and pelvis was performed using the standard protocol following bolus administration of intravenous contrast. CONTRAST:  48mL OMNIPAQUE IOHEXOL 300 MG/ML SOLN, 54mL OMNIPAQUE IOHEXOL 300 MG/ML SOLN, 40mL OMNIPAQUE IOHEXOL 300 MG/ML SOLN COMPARISON:  Comparison made with November of 2016. FINDINGS: Lower chest: Chronic pleural scarring and sequela of chronic effusions with peripheral calcification showing no change since 2016. Associated basilar scarring and rounded atelectasis with similar appearance. Signs of mitral annular calcification and coronary artery disease. Heart size remains enlarged. Hepatobiliary: Signs of liver disease with marked nodularity of hepatic contour and fissural widening and retraction showing LEFT lobe hypertrophy and RIGHT lobe atrophy. No visible lesion on venous phase with patent portal vein. Pancreas: Normal, without mass, inflammation or ductal dilatation. Spleen: Subtle low attenuation along the margin of the spleen with vaguely wedge-shaped morphologic features. This measures approximately 8-9 mm size. More wedge-shaped on coronal images. Adrenals/Urinary Tract: Normal adrenal glands. RIGHT renal cyst in the lower pole measuring approximately 11 mm. No suspicious renal lesion or sign of hydronephrosis. No ureteral calculi or ureteral dilation. Small renal cyst arises from the  interpolar LEFT kidney as well. Stomach/Bowel: No signs of acute gastrointestinal process. Stomach under distended. No evidence of small bowel dilation. The appendix is normal. Stool fills much of the colon. Vascular/Lymphatic: Calcified atheromatous plaque of the abdominal aorta without aneurysmal dilation. Noncalcified plaque as well. There is no gastrohepatic or hepatoduodenal ligament lymphadenopathy. No retroperitoneal or mesenteric lymphadenopathy. No pelvic sidewall lymphadenopathy. Reproductive: Unremarkable. Other: Moderate bilateral fat containing inguinal hernias. Signs of ascites in the pelvis. Small volume simple fluid. No free air. Musculoskeletal: Spinal degenerative changes. No acute or destructive bone process. IMPRESSION: 1. Morphologic features of cirrhosis. 2. Small splenic infarct. 3. Small volume ascites likely related to liver disease. 4. Chronic calcified pleural effusions/pleural scarring and rounded atelectasis without significant change since 2016. 5. Bilateral fat containing inguinal hernias of moderate size. 6. Aortic atherosclerosis. Aortic Atherosclerosis (ICD10-I70.0). Electronically Signed   By: Zetta Bills M.D.   On: 09/05/2020 13:25   DG Chest Port 1 View  Result Date: 09/05/2020 CLINICAL DATA:  Shortness of breath EXAM: PORTABLE CHEST 1 VIEW COMPARISON:  05/11/2019 FINDINGS: Normal heart size. Mitral annular calcification. Small pleural effusions with pleural thickening and calcification by 2012 CT. No acute opacity when compared with prior.  IMPRESSION: 1. No acute finding when compared with priors. 2. Chronic cardiomegaly, pleural thickening and small pleural effusions. Electronically Signed   By: Monte Fantasia M.D.   On: 09/05/2020 09:49    Microbiology: Recent Results (from the past 240 hour(s))  Resp Panel by RT-PCR (Flu A&B, Covid) Nasopharyngeal Swab     Status: None   Collection Time: 09/05/20  5:14 PM   Specimen: Nasopharyngeal Swab; Nasopharyngeal(NP) swabs  in vial transport medium  Result Value Ref Range Status   SARS Coronavirus 2 by RT PCR NEGATIVE NEGATIVE Final    Comment: (NOTE) SARS-CoV-2 target nucleic acids are NOT DETECTED.  The SARS-CoV-2 RNA is generally detectable in upper respiratory specimens during the acute phase of infection. The lowest concentration of SARS-CoV-2 viral copies this assay can detect is 138 copies/mL. A negative result does not preclude SARS-Cov-2 infection and should not be used as the sole basis for treatment or other patient management decisions. A negative result may occur with  improper specimen collection/handling, submission of specimen other than nasopharyngeal swab, presence of viral mutation(s) within the areas targeted by this assay, and inadequate number of viral copies(<138 copies/mL). A negative result must be combined with clinical observations, patient history, and epidemiological information. The expected result is Negative.  Fact Sheet for Patients:  EntrepreneurPulse.com.au  Fact Sheet for Healthcare Providers:  IncredibleEmployment.be  This test is no t yet approved or cleared by the [place] FDA and  has been authorized for detection and/or diagnosis of SARS-CoV-2 by FDA under an Emergency Use Authorization (EUA). This EUA will remain  in effect (meaning this test can be used) for the duration of the COVID-19 declaration under Section 564(b)(1) of the Act, 21 U.S.C.section 360bbb-3(b)(1), unless the authorization is terminated  or revoked sooner.       Influenza A by PCR NEGATIVE NEGATIVE Final   Influenza B by PCR NEGATIVE NEGATIVE Final    Comment: (NOTE) The Xpert Xpress SARS-CoV-2/FLU/RSV plus assay is intended as an aid in the diagnosis of influenza from Nasopharyngeal swab specimens and should not be used as a sole basis for treatment. Nasal washings and aspirates are unacceptable for Xpert Xpress  SARS-CoV-2/FLU/RSV testing.  Fact Sheet for Patients: EntrepreneurPulse.com.au  Fact Sheet for Healthcare Providers: IncredibleEmployment.be  This test is not yet approved or cleared by the [place] FDA and has been authorized for detection and/or diagnosis of SARS-CoV-2 by FDA under an Emergency Use Authorization (EUA). This EUA will remain in effect (meaning this test can be used) for the duration of the COVID-19 declaration under Section 564(b)(1) of the Act, 21 U.S.C. section 360bbb-3(b)(1), unless the authorization is terminated or revoked.  Performed at [facility], [address] [place], [place] [address]      Labs: Basic Metabolic Panel: Recent Labs  Lab 09/05/20 0926 09/05/20 1237 09/06/20 0336  NA 140  --  141  K 4.7  --  4.3  CL 92*  --  90*  CO2 47*  --  42*  GLUCOSE 95  --  84  BUN 27*  --  30*  CREATININE 1.03  --  1.13  CALCIUM 8.5*  --  8.7*  MG  --  2.5*  --    Liver Function Tests: Recent Labs  Lab 09/05/20 0926  AST 21  ALT 16  ALKPHOS 50  BILITOT 1.2  PROT 6.9  ALBUMIN 3.3*   CBC: Recent Labs  Lab 09/05/20 0926 09/05/20 1554 09/06/20 0336  WBC 4.6  --  4.9  NEUTROABS 3.7  --   --   HGB 13.4 13.1 12.7*  HCT 44.3 43.7 41.7  MCV 106.5*  --  105.6*  PLT 110*  --  109*   BNP (last 3 results) Recent Labs    01/13/20 0402 09/05/20 1229  BNP 290.6* 422.0*    Signed:  Barton Dubois MD.  [facility] 09/06/2020, 3:59 PM

## 2020-09-07 ENCOUNTER — Telehealth: Payer: Self-pay

## 2020-09-07 NOTE — Telephone Encounter (Signed)
Transition Care Management Unsuccessful Follow-up Telephone Call  Date of discharge and from where:  [facility] 09/06/20   Diagnosis: Rectal bleeding   Attempts:  1st Attempt  Reason for unsuccessful TCM follow-up call:  Left voice message on patient home #  Transition Care Management Unsuccessful Follow-up Telephone Call  Date of discharge and from where:  [facility] 09/06/20  Diagnosis: rectal bleeding   Attempts:  2nd Attempt  Reason for unsuccessful TCM follow-up call:  Left voice message on patient cell  Transition Care Management Unsuccessful Follow-up Telephone Call  Date of discharge and from where:  [facility] 09/06/20  Diagnosis: rectal bleeding   Attempts:  3rd Attempt  Reason for unsuccessful TCM follow-up call:  Left voice message for step-daughter, Carina

## 2020-09-08 NOTE — Telephone Encounter (Signed)
Transition Care Management Unsuccessful Follow-up Telephone Call  Date of discharge and from where:  [facility] 09/06/20  Diagnosis:  Rectal bleeding   Attempts:  4th attempt  Reason for unsuccessful TCM follow-up call:  Left voice message

## 2020-09-13 ENCOUNTER — Ambulatory Visit: Payer: Medicare PPO | Admitting: Family Medicine

## 2020-09-13 ENCOUNTER — Other Ambulatory Visit: Payer: Self-pay

## 2020-09-13 ENCOUNTER — Encounter: Payer: Self-pay | Admitting: Family Medicine

## 2020-09-13 VITALS — BP 124/55 | HR 75 | Ht 67.0 in | Wt 167.0 lb

## 2020-09-13 DIAGNOSIS — K625 Hemorrhage of anus and rectum: Secondary | ICD-10-CM | POA: Diagnosis not present

## 2020-09-13 DIAGNOSIS — K649 Unspecified hemorrhoids: Secondary | ICD-10-CM

## 2020-09-13 MED ORDER — HYDROCORTISONE ACETATE 25 MG RE SUPP: 25.0000 mg | Freq: Two times a day (BID) | RECTAL | 0 refills | Status: AC

## 2020-09-13 NOTE — Progress Notes (Signed)
BP (!) 124/55   Pulse 75   Ht 5' 7"  (1.702 m)   Wt 167 lb (75.8 kg)   SpO2 (!) 83%   BMI 26.16 kg/m    Subjective:   Patient ID: Joshua [name], male    DOB: Mar 24, 1945, 75 y.o.   MRN: [id number]  HPI: Joshua [name] is a 75 y.o. male presenting on 09/13/2020 for Rectal Bleeding (Seen at [facility]. None since leaving.)   HPI Patient coming in for hospital follow-up and TOC. He was in the hospital for rectal bleeding and had a few episodes of large amount of rectal bleeding bright red blood.  He says he has not had any since leaving the hospital but he did not receive the rectal suppository they were giving him.  Patient is on a blood thinner but denies any blood loss or lightheadedness or dizziness.  He says he denies any abdominal pain either and is not constipated.  He did not know he had hemorrhoids but was told he had hemorrhoids on exam in the hospital.  Relevant past medical, surgical, family and social history reviewed and updated as indicated. Interim medical history since our last visit reviewed. Allergies and medications reviewed and updated.  Review of Systems  Constitutional:  Negative for chills and fever.  Respiratory:  Negative for shortness of breath and wheezing.   Cardiovascular:  Negative for chest pain and leg swelling.  Gastrointestinal:  Positive for anal bleeding. Negative for abdominal pain, constipation, diarrhea, nausea and vomiting.  Musculoskeletal:  Negative for back pain and gait problem.  Skin:  Negative for rash.  All other systems reviewed and are negative.  Per HPI unless specifically indicated above   Allergies as of 09/13/2020   No Known Allergies      Medication List        Accurate as of September 13, 2020 11:51 AM. If you have any questions, ask your nurse or doctor.          STOP taking these medications    senna 8.6 MG Tabs tablet Commonly known as: SENOKOT Stopped by: Joshua Kaufmann Tarrence Enck, MD       TAKE these medications     apixaban 5 MG Tabs tablet Commonly known as: ELIQUIS Take 1 tablet (5 mg total) by mouth 2 (two) times daily.   cholecalciferol 1000 units tablet Commonly known as: VITAMIN D Take 1,000 Units by mouth daily.   cyanocobalamin 1000 MCG tablet Commonly known as: CVS VITAMIN B12 Take 1 tablet (1,000 mcg total) by mouth daily.   dofetilide 500 MCG capsule Commonly known as: Tikosyn Take 1 capsule (500 mcg total) by mouth 2 (two) times daily.   furosemide 40 MG tablet Commonly known as: LASIX TAKE 1 TABLET (40 MG TOTAL) BY MOUTH DAILY.   hydrocortisone 25 MG suppository Commonly known as: ANUSOL-HC Place 1 suppository (25 mg total) rectally every 12 (twelve) hours for 7 days.   losartan 25 MG tablet Commonly known as: COZAAR Take 1 tablet (25 mg total) by mouth daily.   omeprazole 10 MG capsule Commonly known as: PRILOSEC TAKE ONE CAPSULE BY MOUTH ONE TIME DAILY   polyethylene glycol 17 g packet Commonly known as: MIRALAX / GLYCOLAX Take 17 g by mouth as needed for mild constipation.   potassium chloride SA 20 MEQ tablet Commonly known as: KLOR-CON TAKE 1 TABLET (20 MEQ TOTAL) BY MOUTH DAILY.   rosuvastatin 10 MG tablet Commonly known as: CRESTOR Take 1 tablet (10 mg total) by mouth  daily.   tamsulosin 0.4 MG Caps capsule Commonly known as: FLOMAX Take 1 capsule (0.4 mg total) by mouth daily.         Objective:   BP (!) 124/55   Pulse 75   Ht 5' 7"  (1.702 m)   Wt 167 lb (75.8 kg)   SpO2 (!) 83%   BMI 26.16 kg/m   Wt Readings from Last 3 Encounters:  09/13/20 167 lb (75.8 kg)  09/06/20 164 lb 0.4 oz (74.4 kg)  08/01/20 176 lb (79.8 kg)    Physical Exam Vitals reviewed.  Constitutional:      Appearance: Normal appearance.  Genitourinary:    Rectum: External hemorrhoid present. No mass, tenderness or anal fissure. Normal anal tone.  Neurological:     Mental Status: He is alert.      Assessment & Plan:   Problem List Items Addressed This  Visit       Digestive   Rectal bleeding - Primary   Relevant Orders   CBC with Differential/Platelet   CMP14+EGFR   Other Visit Diagnoses     Hemorrhoids, unspecified hemorrhoid type       Relevant Orders   CBC with Differential/Platelet   CMP14+EGFR       No further bleeding, will check blood counts today and follow-up in the future if he has any bleeding.  Sent hemorrhoid cream for him because he did not receive from the hospital. Follow up plan: Return if symptoms worsen or fail to improve.  Counseling provided for all of the vaccine components Orders Placed This Encounter  Procedures   CBC with Differential/Platelet   Hershey Kloe Oates, MD [facility] 09/13/2020, 11:51 AM

## 2020-09-14 ENCOUNTER — Ambulatory Visit (INDEPENDENT_AMBULATORY_CARE_PROVIDER_SITE_OTHER): Payer: Medicare PPO

## 2020-09-14 VITALS — Ht 67.0 in | Wt 176.0 lb

## 2020-09-14 DIAGNOSIS — J449 Chronic obstructive pulmonary disease, unspecified: Secondary | ICD-10-CM | POA: Insufficient documentation

## 2020-09-14 DIAGNOSIS — Z0000 Encounter for general adult medical examination without abnormal findings: Secondary | ICD-10-CM

## 2020-09-14 LAB — CBC WITH DIFFERENTIAL/PLATELET
Basophils Absolute: 0 10*3/uL (ref 0.0–0.2)
Basos: 1 %
EOS (ABSOLUTE): 0.1 10*3/uL (ref 0.0–0.4)
Eos: 1 %
Hematocrit: 42.6 % (ref 37.5–51.0)
Hemoglobin: 13.6 g/dL (ref 13.0–17.7)
Immature Grans (Abs): 0 10*3/uL (ref 0.0–0.1)
Immature Granulocytes: 1 %
Lymphocytes Absolute: 1 10*3/uL (ref 0.7–3.1)
Lymphs: 16 %
MCH: 31.3 pg (ref 26.6–33.0)
MCHC: 31.9 g/dL (ref 31.5–35.7)
MCV: 98 fL — ABNORMAL HIGH (ref 79–97)
Monocytes Absolute: 0.5 10*3/uL (ref 0.1–0.9)
Monocytes: 7 %
Neutrophils Absolute: 4.8 10*3/uL (ref 1.4–7.0)
Neutrophils: 74 %
Platelets: 121 10*3/uL — ABNORMAL LOW (ref 150–450)
RBC: 4.35 x10E6/uL (ref 4.14–5.80)
RDW: 12.6 % (ref 11.6–15.4)
WBC: 6.4 10*3/uL (ref 3.4–10.8)

## 2020-09-14 LAB — CMP14+EGFR
ALT: 14 IU/L (ref 0–44)
AST: 24 IU/L (ref 0–40)
Albumin/Globulin Ratio: 1.3 (ref 1.2–2.2)
Albumin: 3.9 g/dL (ref 3.7–4.7)
Alkaline Phosphatase: 57 IU/L (ref 44–121)
BUN/Creatinine Ratio: 26 — ABNORMAL HIGH (ref 10–24)
BUN: 24 mg/dL (ref 8–27)
Bilirubin Total: 0.9 mg/dL (ref 0.0–1.2)
CO2: 39 mmol/L — ABNORMAL HIGH (ref 20–29)
Calcium: 9.2 mg/dL (ref 8.6–10.2)
Chloride: 94 mmol/L — ABNORMAL LOW (ref 96–106)
Creatinine, Ser: 0.91 mg/dL (ref 0.76–1.27)
Globulin, Total: 3 g/dL (ref 1.5–4.5)
Glucose: 160 mg/dL — ABNORMAL HIGH (ref 65–99)
Potassium: 5 mmol/L (ref 3.5–5.2)
Sodium: 142 mmol/L (ref 134–144)
Total Protein: 6.9 g/dL (ref 6.0–8.5)
eGFR: 88 mL/min/{1.73_m2} (ref >59)

## 2020-09-14 NOTE — Patient Instructions (Signed)
[name] , Thank you for taking time to come for your Medicare Wellness Visit. I appreciate your ongoing commitment to your health goals. Please review the following plan we discussed and let me know if I can assist you in the future.   Screening recommendations/referrals: Colonoscopy: Done 12/04/2016 - Repeat in 5 years  Recommended yearly ophthalmology/optometry visit for glaucoma screening and checkup Recommended yearly dental visit for hygiene and checkup  Vaccinations: Influenza vaccine: Done 11/30/2019 - /ah1  Pneumococcal vaccine: Done 01/29/2013 & 04/03/2017 Tdap vaccine: Done 01/21/2018 - Repeat in 10 years  Shingles vaccine: Done 01/14/2018 & 07/29/2018   Covid-19: Done 04/07/19, 05/05/19, 12/21/19, & 05/29/20  Advanced directives: Please bring a copy of your health care power of attorney and living will to the office to be added to your chart at your convenience.   Conditions/risks identified: Aim for 30 minutes of exercise or brisk walking each day, drink 6-8 glasses of water and eat lots of fruits and vegetables.   Next appointment: Follow up in one year for your annual wellness visit.   Preventive Care 28 Years and Older, Male  Preventive care refers to lifestyle choices and visits with your health care provider that can promote health and wellness. What does preventive care include? A yearly physical exam. This is also called an annual well check. Dental exams once or twice a year. Routine eye exams. Ask your health care provider how often you should have your eyes checked. Personal lifestyle choices, including: Daily care of your teeth and gums. Regular physical activity. Eating a healthy diet. Avoiding tobacco and drug use. Limiting alcohol use. Practicing safe sex. Taking low doses of aspirin every day. Taking vitamin and mineral supplements as recommended by your health care provider. What happens during an annual well check? The services and screenings done by your  health care provider during your annual well check will depend on your age, overall health, lifestyle risk factors, and family history of disease. Counseling  Your health care provider may ask you questions about your: Alcohol use. Tobacco use. Drug use. Emotional well-being. Home and relationship well-being. Sexual activity. Eating habits. History of falls. Memory and ability to understand (cognition). Work and work Statistician. Screening  You may have the following tests or measurements: Height, weight, and BMI. Blood pressure. Lipid and cholesterol levels. These may be checked every 5 years, or more frequently if you are over 22 years old. Skin check. Lung cancer screening. You may have this screening every year starting at age 34 if you have a 30-pack-year history of smoking and currently smoke or have quit within the past 15 years. Fecal occult blood test (FOBT) of the stool. You may have this test every year starting at age 34. Flexible sigmoidoscopy or colonoscopy. You may have a sigmoidoscopy every 5 years or a colonoscopy every 10 years starting at age 24. Prostate cancer screening. Recommendations will vary depending on your family history and other risks. Hepatitis C blood test. Hepatitis B blood test. Sexually transmitted disease (STD) testing. Diabetes screening. This is done by checking your blood sugar (glucose) after you have not eaten for a while (fasting). You may have this done every 1-3 years. Abdominal aortic aneurysm (AAA) screening. You may need this if you are a current or former smoker. Osteoporosis. You may be screened starting at age 52 if you are at high risk. Talk with your health care provider about your test results, treatment options, and if necessary, the need for more tests. Vaccines  Your health care provider may recommend certain vaccines, such as: Influenza vaccine. This is recommended every year. Tetanus, diphtheria, and acellular pertussis  (Tdap, Td) vaccine. You may need a Td booster every 10 years. Zoster vaccine. You may need this after age 65. Pneumococcal 13-valent conjugate (PCV13) vaccine. One dose is recommended after age 2. Pneumococcal polysaccharide (PPSV23) vaccine. One dose is recommended after age 70. Talk to your health care provider about which screenings and vaccines you need and how often you need them. This information is not intended to replace advice given to you by your health care provider. Make sure you discuss any questions you have with your health care provider. Document Released: 03/11/2015 Document Revised: 11/02/2015 Document Reviewed: 12/14/2014 Elsevier Interactive Patient Education  2017 [place] Prevention in the Home Falls can cause injuries. They can happen to people of all ages. There are many things you can do to make your home safe and to help prevent falls. What can I do on the outside of my home? Regularly fix the edges of walkways and driveways and fix any cracks. Remove anything that might make you trip as you walk through a door, such as a raised step or threshold. Trim any bushes or trees on the path to your home. Use bright outdoor lighting. Clear any walking paths of anything that might make someone trip, such as rocks or tools. Regularly check to see if handrails are loose or broken. Make sure that both sides of any steps have handrails. Any raised decks and porches should have guardrails on the edges. Have any leaves, snow, or ice cleared regularly. Use sand or salt on walking paths during winter. Clean up any spills in your garage right away. This includes oil or grease spills. What can I do in the bathroom? Use night lights. Install grab bars by the toilet and in the tub and shower. Do not use towel bars as grab bars. Use non-skid mats or decals in the tub or shower. If you need to sit down in the shower, use a plastic, non-slip stool. Keep the floor dry. Clean  up any water that spills on the floor as soon as it happens. Remove soap buildup in the tub or shower regularly. Attach bath mats securely with double-sided non-slip rug tape. Do not have throw rugs and other things on the floor that can make you trip. What can I do in the bedroom? Use night lights. Make sure that you have a light by your bed that is easy to reach. Do not use any sheets or blankets that are too big for your bed. They should not hang down onto the floor. Have a firm chair that has side arms. You can use this for support while you get dressed. Do not have throw rugs and other things on the floor that can make you trip. What can I do in the kitchen? Clean up any spills right away. Avoid walking on wet floors. Keep items that you use a lot in easy-to-reach places. If you need to reach something above you, use a strong step stool that has a grab bar. Keep electrical cords out of the way. Do not use floor polish or wax that makes floors slippery. If you must use wax, use non-skid floor wax. Do not have throw rugs and other things on the floor that can make you trip. What can I do with my stairs? Do not leave any items on the stairs. Make sure that there are  handrails on both sides of the stairs and use them. Fix handrails that are broken or loose. Make sure that handrails are as long as the stairways. Check any carpeting to make sure that it is firmly attached to the stairs. Fix any carpet that is loose or worn. Avoid having throw rugs at the top or bottom of the stairs. If you do have throw rugs, attach them to the floor with carpet tape. Make sure that you have a light switch at the top of the stairs and the bottom of the stairs. If you do not have them, ask someone to add them for you. What else can I do to help prevent falls? Wear shoes that: Do not have high heels. Have rubber bottoms. Are comfortable and fit you well. Are closed at the toe. Do not wear sandals. If you  use a stepladder: Make sure that it is fully opened. Do not climb a closed stepladder. Make sure that both sides of the stepladder are locked into place. Ask someone to hold it for you, if possible. Clearly mark and make sure that you can see: Any grab bars or handrails. First and last steps. Where the edge of each step is. Use tools that help you move around (mobility aids) if they are needed. These include: Canes. Walkers. Scooters. Crutches. Turn on the lights when you go into a dark area. Replace any light bulbs as soon as they burn out. Set up your furniture so you have a clear path. Avoid moving your furniture around. If any of your floors are uneven, fix them. If there are any pets around you, be aware of where they are. Review your medicines with your doctor. Some medicines can make you feel dizzy. This can increase your chance of falling. Ask your doctor what other things that you can do to help prevent falls. This information is not intended to replace advice given to you by your health care provider. Make sure you discuss any questions you have with your health care provider. Document Released: 12/09/2008 Document Revised: 07/21/2015 Document Reviewed: 03/19/2014 Elsevier Interactive Patient Education  2017 [employer].

## 2020-09-14 NOTE — Progress Notes (Signed)
Subjective:   Joshua CAPPELLETTI is a 75 y.o. male who presents for Medicare Annual/Subsequent preventive examination.  Virtual Visit via Telephone Note  I connected with  Joshua [name] on 09/14/20 at  4:15 PM EDT by telephone and verified that I am speaking with the correct person using two identifiers.  Location: Patient: Home Provider: [facility] Persons participating in the virtual visit: patient/Nurse Health Advisor   I discussed the limitations, risks, security and privacy concerns of performing an evaluation and management service by telephone and the availability of in person appointments. The patient expressed understanding and agreed to proceed.  Interactive audio and video telecommunications were attempted between this nurse and patient, however failed, due to patient having technical difficulties OR patient did not have access to video capability.  We continued and completed visit with audio only.  Some vital signs may be absent or patient reported.   Alga Southall E Jearldine Cassady, LPN   Review of Systems     Cardiac Risk Factors include: advanced age (>50men, >72 women);male gender;dyslipidemia;hypertension;Other (see comment), Risk factor comments: COPD     Objective:    Today's Vitals   09/14/20 1616  Weight: 176 lb (79.8 kg)  Height: 5\' 7"  (1.702 m)  PainSc: 0-No pain   Body mass index is 27.57 kg/m.  Advanced Directives 09/14/2020 09/05/2020 09/05/2020 01/14/2020 10/20/2019 01/14/2018 01/05/2016  Does Patient Have a Medical Advance Directive? Yes No No Yes Yes Yes Yes  Type of Paramedic of [place];Living will - - [place];Living will [place];Living will [place];Living will [place];Living will  Does patient want to make changes to medical advance directive? - - - - - No - Patient declined No - Patient declined  Copy of [place] in Chart? No - copy requested - -  Yes - validated most recent copy scanned in chart (See row information) No - copy requested No - copy requested No - copy requested  Would patient like information on creating a medical advance directive? - No - Patient declined - - - - -    Current Medications (verified) Outpatient Encounter Medications as of 09/14/2020  Medication Sig   apixaban (ELIQUIS) 5 MG TABS tablet Take 1 tablet (5 mg total) by mouth 2 (two) times daily.   cholecalciferol (VITAMIN D) 1000 units tablet Take 1,000 Units by mouth daily.   cyanocobalamin ([employer] VITAMIN B12) 1000 MCG tablet Take 1 tablet (1,000 mcg total) by mouth daily.   dofetilide (TIKOSYN) 500 MCG capsule Take 1 capsule (500 mcg total) by mouth 2 (two) times daily.   furosemide (LASIX) 40 MG tablet TAKE 1 TABLET (40 MG TOTAL) BY MOUTH DAILY.   hydrocortisone (ANUSOL-HC) 25 MG suppository Place 1 suppository (25 mg total) rectally every 12 (twelve) hours for 7 days.   losartan (COZAAR) 25 MG tablet Take 1 tablet (25 mg total) by mouth daily.   omeprazole (PRILOSEC) 10 MG capsule TAKE ONE CAPSULE BY MOUTH ONE TIME DAILY   polyethylene glycol (MIRALAX / GLYCOLAX) packet Take 17 g by mouth as needed for mild constipation.    potassium chloride SA (KLOR-CON) 20 MEQ tablet TAKE 1 TABLET (20 MEQ TOTAL) BY MOUTH DAILY.   rosuvastatin (CRESTOR) 10 MG tablet Take 1 tablet (10 mg total) by mouth daily.   tamsulosin (FLOMAX) 0.4 MG CAPS capsule Take 1 capsule (0.4 mg total) by mouth daily.   No facility-administered encounter medications on file as of 09/14/2020.  Allergies (verified) Patient has no known allergies.   History: Past Medical History:  Diagnosis Date   Acute lower GI bleeding    related to gastritis / viral infection   Cataract    Cirrhosis ([place])    COPD (chronic obstructive pulmonary disease) ([place])    PFT 05-05-2010, FEV1 .9 (26%) ratio 60   Coronary artery disease    Gastritis 06-10-2006   Hiatal hernia 06-10-2006   EGD   HOH (hard of  hearing)    Hx of adenomatous colonic polyps 2005, 2013   Colonoscopy   Hypercholesteremia    Hypertension    Hypertr obst cardiomyop    mild subaortic stenosis   Iron deficiency anemia, unspecified    LVH (left ventricular hypertrophy)    Other B-complex deficiencies    Persistent atrial fibrillation (HCC)    persistent   Rheumatic fever    Thrombocytopenia ([place]) 05/2008   Past Surgical History:  Procedure Laterality Date   ATRIAL FLUTTER ABLATION  06/2010   CTI ablation performed by [name]   CARDIAC CATHETERIZATION  05/28/2005   Widely patent coronaries and normal LV function.   CARDIAC CATHETERIZATION  08/11/2001   80% LAD stenosis successfully stented with a 3.5x71mm Cypher DES postdilated to 3.86mm resulting in reduction of 80% to 0%.   CARDIAC CATHETERIZATION  01/15/2001   Focal 95% proximal RCA stenosis, stented with a 3x31mm Zeta multilink stent with postdilatation utilizing a 3.5x12mm Quantum balloon with stenosis being reduced to 0%.   CARDIOVASCULAR STRESS TEST  05/29/2011   No scintigraphic evidence of inducible myocardial ischemia. No ECG changes. EKG negative for ischemia.   CARDIOVERSION N/A 10/20/2019   Procedure: CARDIOVERSION;  Surgeon: Sanda Klein, MD;  Location: [facility];  Service: Cardiovascular;  Laterality: N/A;   CARDIOVERSION N/A 01/14/2020   Procedure: CARDIOVERSION;  Surgeon: Buford Dresser, MD;  [facility];  Service: Cardiovascular;  Laterality: N/A;   CORONARY ANGIOPLASTY     2002-2003   SHOULDER SURGERY     TRANSTHORACIC ECHOCARDIOGRAM  05/29/2011   EF >70%, severe concentric LV hypertrophy, severe mitral annular calcification, mild-moderate aortic regurg,    Family History  Problem Relation Age of Onset   Heart attack Brother 66       Ventircular rupture   Emphysema Brother    Lung cancer Brother    Heart disease Mother        Enlarged heart   Stroke Mother    Hyperlipidemia Father    CAD Father    Hypertension  Sister    CAD Paternal Uncle    Stroke Paternal Uncle    Cancer Maternal Grandfather        colon   Colon cancer Maternal Grandfather    Asthma Brother    Social History   Socioeconomic History   Marital status: Widowed    Spouse name: Not on file   Number of children: 5   Years of education: Not on file   Highest education level: Not on file  Occupational History   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  Tobacco Use   Smoking status: Former    Packs/day: 1.00    Years: 10.00    Pack years: 10.00    Types: Cigarettes    Quit date: 02/26/1985    Years since quitting: 35.5   Smokeless tobacco: Never  Vaping Use   Vaping Use: Never used  Substance and Sexual Activity   Alcohol use: No   Drug use: No  Sexual activity: Not on file  Other Topics Concern   Not on file  Social History Narrative   Daughter lives with him   Social Determinants of Health   Financial Resource Strain: Low Risk    Difficulty of Paying Living Expenses: Not hard at all  Food Insecurity: No Food Insecurity   Worried About Charity fundraiser in the Last Year: Never true   Arboriculturist in the Last Year: Never true  Transportation Needs: No Transportation Needs   Lack of Transportation (Medical): No   Lack of Transportation (Non-Medical): No  Physical Activity: Sufficiently Active   Days of Exercise per Week: 7 days   Minutes of Exercise per Session: 30 min  Stress: No Stress Concern Present   Feeling of Stress : Not at all  Social Connections: Moderately Integrated   Frequency of Communication with Friends and Family: More than three times a week   Frequency of Social Gatherings with Friends and Family: More than three times a week   Attends Religious Services: More than 4 times per year   Active Member of [employer] or Organizations: Yes   Attends Archivist Meetings: More than 4 times per year   Marital Status: Widowed    Tobacco Counseling Counseling given:  Not Answered   Clinical Intake:  Pre-visit preparation completed: Yes  Pain : No/denies pain Pain Score: 0-No pain     BMI - recorded: 27.57 Nutritional Status: BMI 25 -29 Overweight Nutritional Risks: None Diabetes: No  How often do you need to have someone help you when you read instructions, pamphlets, or other written materials from your doctor or pharmacy?: 1 - Never  Diabetic? No  Interpreter Needed?: No  Information entered by :: Quinlan Vollmer, LPN   Activities of Daily Living In your present state of health, do you have any difficulty performing the following activities: 09/14/2020 09/05/2020  Hearing? Y N  Vision? N N  Difficulty concentrating or making decisions? N N  Walking or climbing stairs? N N  Dressing or bathing? N N  Doing errands, shopping? N N  Preparing Food and eating ? N -  Using the Toilet? N -  In the past six months, have you accidently leaked urine? N -  Do you have problems with loss of bowel control? N -  Managing your Medications? N -  Managing your Finances? N -  Housekeeping or managing your Housekeeping? N -  Some recent data might be hidden    Patient Care Team: [name], [name], MD as PCP - General (Family Medicine) Troy Sine, MD as PCP - Cardiology (Cardiology) Thompson Grayer, MD as PCP - Electrophysiology (Cardiology) Ladene Artist, MD as Consulting Physician (Gastroenterology) Steffanie Rainwater, DPM as Consulting Physician (Podiatry) Willette Pa, Ronalee Red, PA-C as Physician Assistant (Dermatology)  Indicate any recent Medical Services you may have received from other than [facility] providers in the past year (date may be approximate).     Assessment:   This is a routine wellness examination for Joshua [name].  Hearing/Vision screen Hearing Screening - Comments:: Wears hearing aids - from [place] - Comments:: Wears eyeglasses - up to date with annual eye exam at [place] issues and exercise activities  discussed: Current Exercise Habits: Home exercise routine, Type of exercise: walking, Time (Minutes): 30, Frequency (Times/Week): 7, Weekly Exercise (Minutes/Week): 210, Intensity: Mild, Exercise limited by: cardiac condition(s);respiratory conditions(s)   Goals Addressed   None    Depression Screen [facility] 2/9  Scores 09/14/2020 09/13/2020 09/13/2020 09/13/2020 06/13/2020 12/16/2019 10/29/2019  PHQ - 2 Score 0 0 0 0 0 0 0  PHQ- 9 Score - - - - - - -    Fall Risk Fall Risk  09/14/2020 09/13/2020 09/13/2020 06/13/2020 12/16/2019  Falls in the past year? 0 0 0 0 1  Number falls in past yr: 0 - - - 0  Comment - - - - -  Injury with Fall? 0 - - - 0  Risk for fall due to : Impaired vision;Orthopedic patient - - - Impaired balance/gait  Follow up Falls prevention discussed;Education provided - - - Falls evaluation completed    FALL RISK PREVENTION PERTAINING TO THE HOME:  Any stairs in or around the home? Yes  If so, are there any without handrails? No  Home free of loose throw rugs in walkways, pet beds, electrical cords, etc? Yes  Adequate lighting in your home to reduce risk of falls? Yes   ASSISTIVE DEVICES UTILIZED TO PREVENT FALLS:  Life alert? No  Use of a cane, walker or w/c? No  Grab bars in the bathroom? No  Shower chair or bench in shower? No  Elevated toilet seat or a handicapped toilet? No   TIMED UP AND GO:  Was the test performed? No . Telephonic visit  Cognitive Function: Normal cognitive status assessed by direct observation by this Nurse Health Advisor. No abnormalities found.    MMSE - Mini Mental State Exam 01/14/2018 01/08/2017 01/05/2016 11/24/2014  Orientation to time 5 5 5 5   Orientation to Place 5 5 5 5   Registration 3 3 3 3   Attention/ Calculation 5 4 3 2   Recall 3 3 3 3   Language- name 2 objects 2 2 2 2   Language- repeat 1 1 1 1   Language- follow 3 step command 3 3 3 3   Language- read & follow direction 1 1 1 1   Write a sentence 1 1 1 1   Copy design 1 1 1 1    Total score 30 29 28 27         Immunizations Immunization History  Administered Date(s) Administered   Fluad Quad(high Dose 65+) 10/27/2018, 11/30/2019   Influenza Split 11/25/2012   Influenza Whole 10/27/2009   Influenza, High Dose Seasonal PF 12/02/2015, 12/14/2016, 11/22/2017, 12/05/2017   Influenza,inj,Quad PF,6+ Mos 12/14/2014   Influenza-Unspecified 11/10/2013, 11/02/2019   Janssen (J&J) SARS-COV-2 Vaccination 05/06/2019   Moderna Sars-Covid-2 Vaccination 04/07/2019, 05/05/2019, 12/21/2019, 05/29/2020   Pneumococcal Conjugate-13 01/29/2013   Pneumococcal Polysaccharide-23 04/27/2011, 04/03/2017   Tdap 01/21/2018, 01/21/2018   Tetanus 11/05/2012   Zoster Recombinat (Shingrix) 01/14/2018, 07/29/2018    TDAP status: Up to date  Flu Vaccine status: Up to date  Pneumococcal vaccine status: Up to date  Covid-19 vaccine status: Completed vaccines  Qualifies for Shingles Vaccine? Yes   Zostavax completed Yes   Shingrix Completed?: Yes  Screening Tests Health Maintenance  Topic Date Due   INFLUENZA VACCINE  09/26/2020   COLONOSCOPY (Pts 45-24yrs Insurance coverage will need to be confirmed)  12/04/2021   TETANUS/TDAP  01/23/2028   COVID-19 Vaccine  Completed   Hepatitis C Screening  Completed   PNA vac Low Risk Adult  Completed   Zoster Vaccines- Shingrix  Completed   HPV VACCINES  Aged Out    Health Maintenance  There are no preventive care reminders to display for this patient.  Colorectal cancer screening: Type of screening: Colonoscopy. Completed 12/04/2016. Repeat every 5 years  Lung Cancer Screening: (Low Dose CT Chest  recommended if Age 23-80 years, 62 pack-year currently smoking OR have quit w/in 15years.) does not qualify.   Additional Screening:  Hepatitis C Screening: does qualify; Completed 01/15/2015  Vision Screening: Recommended annual ophthalmology exams for early detection of glaucoma and other disorders of the eye. Is the patient up to date  with their annual eye exam?  Yes  Who is the provider or what is the name of the office in which the patient attends annual eye exams? [facility] If pt is not established with a provider, would they like to be referred to a provider to establish care? No .   Dental Screening: Recommended annual dental exams for proper oral hygiene  Community Resource Referral / Chronic Care Management: CRR required this visit?  No   CCM required this visit?  No      Plan:     I have personally reviewed and noted the following in the patient's chart:   Medical and social history Use of alcohol, tobacco or illicit drugs  Current medications and supplements including opioid prescriptions. Patient is not currently taking opioid prescriptions. Functional ability and status Nutritional status Physical activity Advanced directives List of other physicians Hospitalizations, surgeries, and ER visits in previous 12 months Vitals Screenings to include cognitive, depression, and falls Referrals and appointments  In addition, I have reviewed and discussed with patient certain preventive protocols, quality metrics, and best practice recommendations. A written personalized care plan for preventive services as well as general preventive health recommendations were provided to patient.     Sandrea Hammond, LPN   03/28/4386   Nurse Notes: None

## 2020-09-19 LAB — SPECIMEN STATUS REPORT

## 2020-09-19 LAB — HGB A1C W/O EAG: Hgb A1c MFr Bld: 5.2 % (ref 4.8–5.6)

## 2020-10-03 ENCOUNTER — Ambulatory Visit: Payer: Medicare PPO | Admitting: Internal Medicine

## 2020-10-04 ENCOUNTER — Ambulatory Visit: Payer: Medicare PPO | Admitting: Pulmonary Disease

## 2020-10-04 NOTE — Progress Notes (Deleted)
Synopsis: Referred by Joshua Friar, PA for respiratory failure  Subjective:   PATIENT ID: Joshua [name] GENDER: male DOB: [birth year], MRN: [id number]  Using saline gel and nasal spray to help moisturize his nose. Complains of nasal congestion due to the oxygen.   HPI  No chief complaint on file.  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  He has been doing well since last visit. He has exertional shortness of breath. He has been on 3L of oxygen. He had a sleep study performed which indicated nocturnal hypoxemia as well but not obstructive sleep apnea. The study recommended he be on 3L of oxygen at night.   He complains of runny nose, nasal crusting and irritation with being on the oxygen 24/7 now. He is using ayr saline gel and saline nasal spray daily.   He had PFTs today which show FEV1/FVC ratio 75, FEV1 1.13L (41%), FVC 1.48L (39%), TLC 4.02L (62%), DLCO 48%.    PFTs from 2012 show FEV1/FVC ratio of 73, FEV1 1.23L (41%) and FVC 1.68L (38%). TLC 3.94L (62%) and DLCO 42% consistent with a mild restrictive defect and moderate diffusion defect.   On chest imaging review, he had a left pleural effusion as early as 2006 and underwent a thoracentesis at that time. I am not able to see the pleural fluid studies but the cytology from that sample was negative for malignant cells. In 2008 he was noted to have bilateral pleural effusions at that time. He appears to have had a complicated history involving both pleural spaces that developed a fibrotic component as noted from a CT chest with contrast in 2012 and again on CT abdomen of 2016 with some rounded atelectasis of the right lower lobe.       Past Medical History:  Diagnosis Date   Acute lower GI  bleeding    related to gastritis / viral infection   Cataract    Cirrhosis ([place])    COPD (chronic obstructive pulmonary disease) ([place])    PFT 05-05-2010, FEV1 .9 (26%) ratio 60   Coronary artery disease    Gastritis 06-10-2006   Hiatal hernia 06-10-2006   EGD   HOH (hard of hearing)    Hx of adenomatous colonic polyps 2005, 2013   Colonoscopy   Hypercholesteremia    Hypertension    Hypertr obst cardiomyop    mild subaortic stenosis   Iron deficiency anemia, unspecified    LVH (left ventricular hypertrophy)    Other B-complex deficiencies    Persistent atrial fibrillation (HCC)    persistent   Rheumatic fever    Thrombocytopenia ([place]) 05/2008     Family History  Problem Relation Age of Onset   Heart attack Brother 44       Ventircular rupture   Emphysema Brother    Lung cancer Brother    Heart disease Mother        Enlarged heart   Stroke Mother    Hyperlipidemia Father    CAD Father    Hypertension Sister    CAD Paternal Uncle    Stroke Paternal Uncle    Cancer Maternal Grandfather        colon   Colon cancer Maternal Grandfather    Asthma Brother      Social History   Socioeconomic History  Marital status: Widowed    Spouse name: Not on file   Number of children: 5   Years of education: Not on file   Highest education level: Not on file  Occupational History   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  Tobacco Use   Smoking status: Former    Packs/day: 1.00    Years: 10.00    Pack years: 10.00    Types: Cigarettes    Quit date: 02/26/1985    Years since quitting: 35.6   Smokeless tobacco: Never  Vaping Use   Vaping Use: Never used  Substance and Sexual Activity   Alcohol use: No   Drug use: No   Sexual activity: Not on file  Other Topics Concern   Not on file  Social History Narrative   Daughter lives with him   Social Determinants of Health   Financial Resource Strain: Low Risk    Difficulty of Paying Living Expenses: Not hard  at all  Food Insecurity: No Food Insecurity   Worried About Charity fundraiser in the Last Year: Never true   Arboriculturist in the Last Year: Never true  Transportation Needs: No Transportation Needs   Lack of Transportation (Medical): No   Lack of Transportation (Non-Medical): No  Physical Activity: Sufficiently Active   Days of Exercise per Week: 7 days   Minutes of Exercise per Session: 30 min  Stress: No Stress Concern Present   Feeling of Stress : Not at all  Social Connections: Moderately Integrated   Frequency of Communication with Friends and Family: More than three times a week   Frequency of Social Gatherings with Friends and Family: More than three times a week   Attends Religious Services: More than 4 times per year   Active Member of [employer] or Organizations: Yes   Attends Archivist Meetings: More than 4 times per year   Marital Status: Widowed  Human resources officer Violence: Not At Risk   Fear of Current or Ex-Partner: No   Emotionally Abused: No   Physically Abused: No   Sexually Abused: No     No Known Allergies    Outpatient Medications Prior to Visit  Medication Sig Dispense Refill   apixaban (ELIQUIS) 5 MG TABS tablet Take 1 tablet (5 mg total) by mouth 2 (two) times daily. 180 tablet 1   cholecalciferol (VITAMIN D) 1000 units tablet Take 1,000 Units by mouth daily.     cyanocobalamin ([employer] VITAMIN B12) 1000 MCG tablet Take 1 tablet (1,000 mcg total) by mouth daily.     dofetilide (TIKOSYN) 500 MCG capsule Take 1 capsule (500 mcg total) by mouth 2 (two) times daily. 60 capsule 6   furosemide (LASIX) 40 MG tablet TAKE 1 TABLET (40 MG TOTAL) BY MOUTH DAILY. 30 tablet 0   losartan (COZAAR) 25 MG tablet Take 1 tablet (25 mg total) by mouth daily.     omeprazole (PRILOSEC) 10 MG capsule TAKE ONE CAPSULE BY MOUTH ONE TIME DAILY 90 capsule 0   polyethylene glycol (MIRALAX / GLYCOLAX) packet Take 17 g by mouth as needed for mild constipation.      potassium  chloride SA (KLOR-CON) 20 MEQ tablet TAKE 1 TABLET (20 MEQ TOTAL) BY MOUTH DAILY. 30 tablet 0   rosuvastatin (CRESTOR) 10 MG tablet Take 1 tablet (10 mg total) by mouth daily. 90 tablet 3   tamsulosin (FLOMAX) 0.4 MG CAPS capsule Take 1 capsule (0.4 mg total) by mouth daily. [place]  capsule 3   No facility-administered medications prior to visit.    Review of Systems  Cardiovascular:  Negative for leg swelling.   Objective:   There were no vitals filed for this visit.   Physical Exam Cardiovascular:     Rate and Rhythm: Normal rate and regular rhythm.     Pulses: Normal pulses.     Heart sounds: Murmur (systolic) heard.  Pulmonary:     Breath sounds: Rales (mild, at bases) present.  Musculoskeletal:     Right lower leg: No edema.     Left lower leg: No edema.    CBC    Component Value Date/Time   WBC 6.4 09/13/2020 1155   WBC 4.9 09/06/2020 0336   RBC 4.35 09/13/2020 1155   RBC 3.95 (L) 09/06/2020 0336   HGB 13.6 09/13/2020 1155   HCT 42.6 09/13/2020 1155   PLT 121 (L) 09/13/2020 1155   MCV 98 (H) 09/13/2020 1155   MCH 31.3 09/13/2020 1155   MCH 32.2 09/06/2020 0336   MCHC 31.9 09/13/2020 1155   MCHC 30.5 09/06/2020 0336   RDW 12.6 09/13/2020 1155   LYMPHSABS 1.0 09/13/2020 1155   MONOABS 0.3 09/05/2020 0926   EOSABS 0.1 09/13/2020 1155   BASOSABS 0.0 09/13/2020 1155   BMP Latest Ref Rng & Units 09/13/2020 09/06/2020 09/05/2020  Glucose 65 - 99 mg/dL 160(H) 84 95  BUN 8 - 27 mg/dL 24 30(H) 27(H)  Creatinine 0.76 - 1.27 mg/dL 0.91 1.13 1.03  BUN/Creat Ratio 10 - 24 26(H) - -  Sodium 134 - 144 mmol/L 142 141 140  Potassium 3.5 - 5.2 mmol/L 5.0 4.3 4.7  Chloride 96 - 106 mmol/L 94(L) 90(L) 92(L)  CO2 20 - 29 mmol/L 39(H) 42(H) 47(H)  Calcium 8.6 - 10.2 mg/dL 9.2 8.7(L) 8.5(L)   Chest imaging: Reviewed as per HPI  PFT:  2012 FEV1/FVC 73 FVC 38 % FEV1 41% TLC 62% DLCO 42%  2021 FEV1/FVC 76 FVC 39% FEV1 41% TLC 62% DLCO 48%  Echo: 10/08/19 1. The severe  MAC projects into the LVOT, causing flow turbulence.  However, no significant LVOT gradient noted.. Left ventricular ejection  fraction, by estimation, is 60 to 65%. The left ventricle has normal  function. The left ventricle has no regional  wall motion abnormalities. There is severe concentric left ventricular  hypertrophy. Left ventricular diastolic function could not be evaluated.   2. Right ventricular systolic function is low normal. The right  ventricular size is mildly enlarged. There is mildly elevated pulmonary  artery systolic pressure.   3. Left atrial size was moderately dilated.   4. Right atrial size was moderately dilated.   5. The mitral valve is degenerative. Mild mitral valve regurgitation.  Mild mitral stenosis. The mean mitral valve gradient is 7.0 mmHg.   6. The aortic valve is tricuspid. Aortic valve regurgitation is moderate.   7. The inferior vena cava is normal in size with greater than 50%  respiratory variability, suggesting right atrial pressure of 3 mmHg.    Sleep Study 02/24/20 IMPRESSIONS - No significant obstructive sleep apnea occurred during this study (AHI = 1.4/h). - No significant central sleep apnea occurred during this study (CAI = 0.0/h). - He had significant oxygen desaturation in the abscence of other respiratory events.  This improved after the addition of 3 liters oxygen.  Assessment & Plan:   No diagnosis found.  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  His dyspnea is multifactorial in the setting of complicated history of pleural effusions that appear to have developed into chronic fibrothoraces leading to restrictive lung disease. He also has diffuse parenchymal scarring which contributes to his diffusion defect. He also  has a component of pulmonary hypertension in setting of chronic lung disease and multivalvular heart disease.   He is to remain on supplemental oxygen and we stressed the importance of wearing it at all times. He is lasix per cardiology which will help with the degree of pulmonary hypertension.   We will send an order to the [place] for a portable oxygen concentrator.    Follow up in 6 months.  Freda Jackson, MD [facility] Office: [phone]   See Amion for Pager Details     Current Outpatient Medications:    apixaban (ELIQUIS) 5 MG TABS tablet, Take 1 tablet (5 mg total) by mouth 2 (two) times daily., Disp: 180 tablet, Rfl: 1   cholecalciferol (VITAMIN D) 1000 units tablet, Take 1,000 Units by mouth daily., Disp: , Rfl:    cyanocobalamin ([employer] VITAMIN B12) 1000 MCG tablet, Take 1 tablet (1,000 mcg total) by mouth daily., Disp: , Rfl:    dofetilide (TIKOSYN) 500 MCG capsule, Take 1 capsule (500 mcg total) by mouth 2 (two) times daily., Disp: 60 capsule, Rfl: 6   furosemide (LASIX) 40 MG tablet, TAKE 1 TABLET (40 MG TOTAL) BY MOUTH DAILY., Disp: 30 tablet, Rfl: 0   losartan (COZAAR) 25 MG tablet, Take 1 tablet (25 mg total) by mouth daily., Disp: , Rfl:    omeprazole (PRILOSEC) 10 MG capsule, TAKE ONE CAPSULE BY MOUTH ONE TIME DAILY, Disp: 90 capsule, Rfl: 0   polyethylene glycol (MIRALAX / GLYCOLAX) packet, Take 17 g by mouth as needed for mild constipation. , Disp: , Rfl:    potassium chloride SA (KLOR-CON) 20 MEQ tablet, TAKE 1 TABLET (20 MEQ TOTAL) BY MOUTH DAILY., Disp: 30 tablet, Rfl: 0   rosuvastatin (CRESTOR) 10 MG tablet, Take 1 tablet (10 mg total) by mouth daily., Disp: 90 tablet, Rfl: 3   tamsulosin (FLOMAX) 0.4 MG CAPS capsule, Take 1 capsule (0.4 mg total) by mouth daily., Disp: 90 capsule, Rfl: 3

## 2020-10-05 ENCOUNTER — Ambulatory Visit (HOSPITAL_COMMUNITY): Payer: Medicare PPO | Attending: Student

## 2020-10-05 ENCOUNTER — Other Ambulatory Visit: Payer: Self-pay

## 2020-10-05 DIAGNOSIS — I421 Obstructive hypertrophic cardiomyopathy: Secondary | ICD-10-CM

## 2020-10-05 LAB — ECHOCARDIOGRAM COMPLETE
AR max vel: 1.52 cm2
AV Area VTI: 1.65 cm2
AV Area mean vel: 1.51 cm2
AV Mean grad: 13.4 mmHg
AV Peak grad: 23.8 mmHg
Ao pk vel: 2.44 m/s
Area-P 1/2: 2.2 cm2
MV VTI: 1.71 cm2
P 1/2 time: 381 msec
S' Lateral: 3.1 cm

## 2020-10-05 MED ORDER — PERFLUTREN LIPID MICROSPHERE
1.0000 mL | INTRAVENOUS | Status: AC | PRN
  Administered 2020-10-05: 2 mL via INTRAVENOUS

## 2020-10-07 NOTE — Progress Notes (Signed)
PCP:  [name], [name], MD Primary Cardiologist: Shelva Majestic, MD Electrophysiologist: Thompson Grayer, MD   Joshua [name] is a 75 y.o. male seen today for Thompson Grayer, MD for routine electrophysiology followup.  Since last being seen in our clinic the patient reports doing about the same. He can get around a grocery pushing a cart without DOE. Mild SOB with hills or inclines, but overall doing OK. Feels like he is in and out of rhythm at times. Does not feel that he is out of rhythm today. Had swelling start over the weekend when he held lasix for church.  he denies chest pain, PND, orthopnea, nausea, vomiting, dizziness, syncope,  weight gain, or early satiety.  Past Medical History:  Diagnosis Date   Acute lower GI bleeding    related to gastritis / viral infection   Cataract    Cirrhosis ([place])    COPD (chronic obstructive pulmonary disease) ([place])    PFT 05-05-2010, FEV1 .9 (26%) ratio 60   Coronary artery disease    Gastritis 06-10-2006   Hiatal hernia 06-10-2006   EGD   HOH (hard of hearing)    Hx of adenomatous colonic polyps 2005, 2013   Colonoscopy   Hypercholesteremia    Hypertension    Hypertr obst cardiomyop    mild subaortic stenosis   Iron deficiency anemia, unspecified    LVH (left ventricular hypertrophy)    Other B-complex deficiencies    Persistent atrial fibrillation (HCC)    persistent   Rheumatic fever    Thrombocytopenia ([place]) 05/2008   Past Surgical History:  Procedure Laterality Date   ATRIAL FLUTTER ABLATION  06/2010   CTI ablation performed by [name]   CARDIAC CATHETERIZATION  05/28/2005   Widely patent coronaries and normal LV function.   CARDIAC CATHETERIZATION  08/11/2001   80% LAD stenosis successfully stented with a 3.5x92m Cypher DES postdilated to 3.975mresulting in reduction of 80% to 0%.   CARDIAC CATHETERIZATION  01/15/2001   Focal 95% proximal RCA stenosis, stented with a [id number] multilink stent with postdilatation utilizing a  3.5x88m41mantum balloon with stenosis being reduced to 0%.   CARDIOVASCULAR STRESS TEST  05/29/2011   No scintigraphic evidence of inducible myocardial ischemia. No ECG changes. EKG negative for ischemia.   CARDIOVERSION N/A 10/20/2019   Procedure: CARDIOVERSION;  Surgeon: CroiSanda Klein;  [facility];  Laterality: N/A;   CARDIOVERSION N/A 01/14/2020   Procedure: CARDIOVERSION;  Surgeon: ChriBuford Dresser;  [facility];  Service: Cardiovascular;  Laterality: N/A;   CORONARY ANGIOPLASTY     2002-2003   SHOULDER SURGERY     TRANSTHORACIC ECHOCARDIOGRAM  05/29/2011   EF >70%, severe concentric LV hypertrophy, severe mitral annular calcification, mild-moderate aortic regurg,     Current Outpatient Medications  Medication Sig Dispense Refill   apixaban (ELIQUIS) 5 MG TABS tablet Take 1 tablet (5 mg total) by mouth 2 (two) times daily. 180 tablet 1   cholecalciferol (VITAMIN D) 1000 units tablet Take 1,000 Units by mouth daily.     cyanocobalamin ([employer]S VITAMIN B12) 1000 MCG tablet Take 1 tablet (1,000 mcg total) by mouth daily.     dofetilide (TIKOSYN) 500 MCG capsule Take 1 capsule (500 mcg total) by mouth 2 (two) times daily. 60 capsule 6   furosemide (LASIX) 40 MG tablet TAKE 1 TABLET (40 MG TOTAL) BY MOUTH DAILY. 30 tablet 0   losartan (COZAAR) 25 MG tablet Take 1 tablet (25 mg total)  by mouth daily.     omeprazole (PRILOSEC) 10 MG capsule TAKE ONE CAPSULE BY MOUTH ONE TIME DAILY 90 capsule 0   polyethylene glycol (MIRALAX / GLYCOLAX) packet Take 17 g by mouth as needed for mild constipation.      potassium chloride SA (KLOR-CON) 20 MEQ tablet TAKE 1 TABLET (20 MEQ TOTAL) BY MOUTH DAILY. 30 tablet 0   rosuvastatin (CRESTOR) 10 MG tablet Take 1 tablet (10 mg total) by mouth daily. 90 tablet 3   tamsulosin (FLOMAX) 0.4 MG CAPS capsule Take 1 capsule (0.4 mg total) by mouth daily. 90 capsule 3   No current facility-administered  medications for this visit.    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  Tobacco Use   Smoking status: Former    Packs/day: 1.00    Years: 10.00    Pack years: 10.00    Types: Cigarettes    Quit date: 02/26/1985    Years since quitting: 35.6   Smokeless tobacco: Never  Vaping Use   Vaping Use: Never used  Substance and Sexual Activity   Alcohol use: No   Drug use: No   Sexual activity: Not on file  Other Topics Concern   Not on file  Social History Narrative   Daughter lives with him   Social Determinants of Health   Financial Resource Strain: Low Risk    Difficulty of Paying Living Expenses: Not hard at all  Food Insecurity: No Food Insecurity   Worried About Charity fundraiser in the Last Year: Never true   Arboriculturist in the Last Year: Never true  Transportation Needs: No Transportation Needs   Lack of Transportation (Medical): No   Lack of Transportation (Non-Medical): No  Physical Activity: Sufficiently Active   Days of Exercise per Week: 7 days   Minutes of Exercise per Session: 30 min  Stress: No Stress Concern Present   Feeling of Stress : Not at all  Social Connections: Moderately Integrated   Frequency of Communication with Friends and Family: More than three times a week   Frequency of Social Gatherings with Friends and Family: More than three times a week   Attends Religious Services: More than 4 times per year   Active Member of [employer] or Organizations: Yes   Attends Archivist Meetings: More than 4 times per year   Marital Status: Widowed  Human resources officer Violence: Not At Risk   Fear of Current or Ex-Partner: No   Emotionally Abused: No   Physically Abused: No   Sexually Abused: No      Review of Systems: General: No chills, fever, night sweats or weight changes  Cardiovascular:  No chest pain, dyspnea on exertion, edema, orthopnea, palpitations, paroxysmal nocturnal dyspnea Dermatological: No rash, lesions or masses Respiratory: No cough, dyspnea Urologic: No hematuria, dysuria Abdominal: No nausea, vomiting, diarrhea, bright red blood per rectum, melena, or hematemesis Neurologic: No visual changes, weakness, changes in mental status All other systems reviewed and are otherwise negative except as noted above.  Physical Exam: There were no vitals filed for this visit.  GEN- The patient is well appearing, alert and oriented x 3 today.   HEENT: normocephalic,  atraumatic; sclera clear, conjunctiva pink; hearing intact; oropharynx clear; neck supple, no JVP Lymph- no cervical lymphadenopathy Lungs- Clear to ausculation bilaterally, normal work of breathing.  No wheezes, rales, rhonchi Heart- Regular rate and rhythm, no murmurs, rubs or gallops, PMI not laterally displaced GI- soft, non-tender, non-distended, bowel sounds present, no hepatosplenomegaly Extremities- no clubbing, cyanosis, or edema; DP/PT/radial pulses 2+ bilaterally MS- no significant deformity or atrophy Skin- warm and dry, no rash or lesion Psych- euthymic mood, full affect Neuro- strength and sensation are intact  EKG is ordered today. Personal review of EKG today shows coarse AF at 69 bpm with stable QTc at 460 ms  Additional studies reviewed include: Previous EP office notes.   Assessment and Plan:  1. Persistent atrial fibrillation / atypical atrial flutter EKG shows coarse AF at 69 bpm  Continue Tikosyn 500 BID for now.  Continue Eliquis for CHA2DS2VASC of at least 4.   BMET and Mg today. His rates are well controlled. I think more likely he is out of rhythm persistently, but he has "good days".  Discussed with [name].   Options are to  1. stop tikosyn and pursue control alone   2. stop tikosyn and try amiodarone.   Discussed these options at length with patient. We discussed monitoring with kardia mobile if he thinks he's going in and out of rhythm. He feels overall OK and is hesitant to stop tikosyn. Will follow back up in 6-8 weeks to further discuss.  Will forward chart to Pulm to see if they think amiodarone would aggravate his specific lung issues.  He is not an ablation candidate with extensive valvular disease and COPD.  I again discussed with the patient today I feel his arrhythmias are not the primary source of his malaise, and even aggressive management of his fib/flutter may still be unlikely to provide clinical improvement.   2. Chronic chronic diastolic CHF Volume status elevated on exam, didn't take lasix over weekend.   Continue lasix 40 mg daily with extra prn.  Encouraged to take an extra today to get back in line. Encouraged fluid restriction to <2L daily, and sodium restriction.  Confounded by chronic venous stasis.    3. COPD with chronic hypoxic respiratory failure Continue O2. He is following pulmonary.  PFTs with restrictive lung disease likely from pleural effusions that developed into chronic fibrothoraces per notes.  Sleep study results have still not been signed.  4. Extensive valvular disease and HOCM Moderate to severe AI, Moderate MS, and HOCM likely with outflow obstruction Review echo and case at length with Dr. Burt Knack today. Suspect any surgery would require multiple valves AND myectomy with significantly elevated risk of mortality, if not prohibitive.  Pt states he is not interested in considering surgical means at this times, and understands he will likely not be candidate. I will forward his chart as well to Dr. Gasper Sells, but I do not expect him to be a candidate for newer HCM agents.  Shirley Friar, PA-C  10/07/20 2:48 PM

## 2020-10-10 ENCOUNTER — Other Ambulatory Visit: Payer: Self-pay

## 2020-10-10 ENCOUNTER — Ambulatory Visit: Payer: Medicare PPO | Admitting: Student

## 2020-10-10 ENCOUNTER — Encounter: Payer: Self-pay | Admitting: Student

## 2020-10-10 VITALS — BP 118/72 | HR 69 | Ht 67.0 in | Wt 171.6 lb

## 2020-10-10 DIAGNOSIS — I5032 Chronic diastolic (congestive) heart failure: Secondary | ICD-10-CM

## 2020-10-10 DIAGNOSIS — J449 Chronic obstructive pulmonary disease, unspecified: Secondary | ICD-10-CM | POA: Diagnosis not present

## 2020-10-10 DIAGNOSIS — I4892 Unspecified atrial flutter: Secondary | ICD-10-CM

## 2020-10-10 LAB — COMPREHENSIVE METABOLIC PANEL
ALT: 10 IU/L (ref 0–44)
AST: 16 IU/L (ref 0–40)
Albumin/Globulin Ratio: 1.1 — ABNORMAL LOW (ref 1.2–2.2)
Albumin: 3.3 g/dL — ABNORMAL LOW (ref 3.7–4.7)
Alkaline Phosphatase: 57 IU/L (ref 44–121)
BUN/Creatinine Ratio: 28 — ABNORMAL HIGH (ref 10–24)
BUN: 30 mg/dL — ABNORMAL HIGH (ref 8–27)
Bilirubin Total: 1.4 mg/dL — ABNORMAL HIGH (ref 0.0–1.2)
CO2: 38 mmol/L — ABNORMAL HIGH (ref 20–29)
Calcium: 8.9 mg/dL (ref 8.6–10.2)
Chloride: 97 mmol/L (ref 96–106)
Creatinine, Ser: 1.08 mg/dL (ref 0.76–1.27)
Globulin, Total: 3 g/dL (ref 1.5–4.5)
Glucose: 138 mg/dL — ABNORMAL HIGH (ref 65–99)
Potassium: 4.5 mmol/L (ref 3.5–5.2)
Sodium: 142 mmol/L (ref 134–144)
Total Protein: 6.3 g/dL (ref 6.0–8.5)
eGFR: 71 mL/min/{1.73_m2} (ref >59)

## 2020-10-10 LAB — MAGNESIUM: Magnesium: 2.3 mg/dL (ref 1.6–2.3)

## 2020-10-10 NOTE — Patient Instructions (Signed)
Medication Instructions:  Your physician recommends that you continue on your current medications as directed. Please refer to the Current Medication list given to you today.  *If you need a refill on your cardiac medications before your next appointment, please call your pharmacy*   Lab Work: TODAY: CMET, Mg  If you have labs (blood work) drawn today and your tests are completely normal, you will receive your results only by: [place] (if you have MyChart) OR A paper copy in the mail If you have any lab test that is abnormal or we need to change your treatment, we will call you to review the results.   Follow-Up: At [facility], you and your health needs are our priority.  As part of our continuing mission to provide you with exceptional heart care, we have created designated Provider Care Teams.  These Care Teams include your primary Cardiologist (physician) and Advanced Practice Providers (APPs -  Physician Assistants and Nurse Practitioners) who all work together to provide you with the care you need, when you need it.  Your next appointment:   11/21/2020 with Oda Kilts, PA   Other Instructions [employer]  FDA-cleared EKG at your fingertips. - [employer]   Agricultural engineer, [employer]. [email or web address]   FDA-cleared, clinical grade mobile EKG monitor: Jodelle Red is the most clinically-validated mobile EKG used by the world's leading cardiac care medical professionals.  This may be useful in monitoring palpitations.  We do not have access to have them emailed and reviewed but will be glad to review while in the office.

## 2020-10-13 ENCOUNTER — Encounter: Payer: Self-pay | Admitting: Family Medicine

## 2020-10-13 ENCOUNTER — Other Ambulatory Visit: Payer: Self-pay

## 2020-10-13 ENCOUNTER — Ambulatory Visit (INDEPENDENT_AMBULATORY_CARE_PROVIDER_SITE_OTHER): Payer: Medicare PPO | Admitting: Family Medicine

## 2020-10-13 VITALS — BP 115/55 | HR 72 | Temp 98.1°F | Ht 67.0 in | Wt 170.0 lb

## 2020-10-13 DIAGNOSIS — I5032 Chronic diastolic (congestive) heart failure: Secondary | ICD-10-CM | POA: Diagnosis not present

## 2020-10-13 DIAGNOSIS — R600 Localized edema: Secondary | ICD-10-CM

## 2020-10-13 MED ORDER — FUROSEMIDE 80 MG PO TABS: 80.0000 mg | ORAL_TABLET | Freq: Every day | ORAL | 0 refills | Status: DC

## 2020-10-13 NOTE — Progress Notes (Signed)
Subjective:  Patient ID: Joshua [name], male    DOB: [birth year], 75 y.o.   MRN: [id number]  Patient Care Team: [name], [name], MD as PCP - General (Family Medicine) Troy Sine, MD as PCP - Cardiology (Cardiology) Thompson Grayer, MD as PCP - Electrophysiology (Cardiology) Ladene Artist, MD as Consulting Physician (Gastroenterology) Steffanie Rainwater, DPM as Consulting Physician (Podiatry) Willette Pa, Maurine Cane as Physician Assistant (Dermatology)   Chief Complaint:  Leg Swelling   HPI: Joshua [name] is a 75 y.o. male presenting on 10/13/2020 for Leg Swelling   Pt presents today with complaints of BLE swelling. States he noticed this when he woke up this morning. He does not weight so unaware if weight has changed. Weight was 171 lb at recent cardiology visit, 170 lb today. He denies increased salt intake, orthopnea, increased shortness of breath, or arrhythmia. No change in urinary output, weakness, or confusion. He was instructed by cardiology to take an extra dose of lasix as needed for increased swelling. He did not take an extra dose as instructed. eGFR 71 on 10/10/2020.   Relevant past medical, surgical, family, and social history reviewed and updated as indicated.  Allergies and medications reviewed and updated. Data reviewed: Chart in Epic.   Past Medical History:  Diagnosis Date   Acute lower GI bleeding    related to gastritis / viral infection   Cataract    Cirrhosis ([place])    COPD (chronic obstructive pulmonary disease) ([place])    PFT 05-05-2010, FEV1 .9 (26%) ratio 60   Coronary artery disease    Gastritis 06-10-2006   Hiatal hernia 06-10-2006   EGD   HOH (hard of hearing)    Hx of adenomatous colonic polyps 2005, 2013   Colonoscopy   Hypercholesteremia    Hypertension    Hypertr obst cardiomyop    mild subaortic stenosis   Iron deficiency anemia, unspecified    LVH (left ventricular hypertrophy)    Other B-complex deficiencies    Persistent atrial  fibrillation (HCC)    persistent   Rheumatic fever    Thrombocytopenia ([place]) 05/2008    Past Surgical History:  Procedure Laterality Date   ATRIAL FLUTTER ABLATION  06/2010   CTI ablation performed by [name]   CARDIAC CATHETERIZATION  05/28/2005   Widely patent coronaries and normal LV function.   CARDIAC CATHETERIZATION  08/11/2001   80% LAD stenosis successfully stented with a 3.5x24m Cypher DES postdilated to 3.969mresulting in reduction of 80% to 0%.   CARDIAC CATHETERIZATION  01/15/2001   Focal 95% proximal RCA stenosis, stented with a [id number] multilink stent with postdilatation utilizing a 3.5x88m65mantum balloon with stenosis being reduced to 0%.   CARDIOVASCULAR STRESS TEST  05/29/2011   No scintigraphic evidence of inducible myocardial ischemia. No ECG changes. EKG negative for ischemia.   CARDIOVERSION N/A 10/20/2019   Procedure: CARDIOVERSION;  Surgeon: CroiSanda Klein;  [facility];  Service: Cardiovascular;  Laterality: N/A;   CARDIOVERSION N/A 01/14/2020   Procedure: CARDIOVERSION;  Surgeon: ChriBuford Dresser;  [facility];  Service: Cardiovascular;  Laterality: N/A;   CORONARY ANGIOPLASTY     2002-2003   SHOULDER SURGERY     TRANSTHORACIC ECHOCARDIOGRAM  05/29/2011   EF >70%, severe concentric LV hypertrophy, severe mitral annular calcification, mild-moderate aortic regurg,     Social History   Socioeconomic History   Marital status: Widowed    Spouse name: Not on file   Number of  children: 5   Years of education: Not on file   Highest education level: Not on file  Occupational History   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  Tobacco Use   Smoking status: Former    Packs/day: 1.00    Years: 10.00    Pack years: 10.00    Types: Cigarettes    Quit date: 02/26/1985    Years since quitting: 35.6   Smokeless tobacco: Never  Vaping Use   Vaping Use: Never used  Substance and Sexual Activity   Alcohol  use: No   Drug use: No   Sexual activity: Not on file  Other Topics Concern   Not on file  Social History Narrative   Daughter lives with him   Social Determinants of Health   Financial Resource Strain: Low Risk    Difficulty of Paying Living Expenses: Not hard at all  Food Insecurity: No Food Insecurity   Worried About Charity fundraiser in the Last Year: Never true   Arboriculturist in the Last Year: Never true  Transportation Needs: No Transportation Needs   Lack of Transportation (Medical): No   Lack of Transportation (Non-Medical): No  Physical Activity: Sufficiently Active   Days of Exercise per Week: 7 days   Minutes of Exercise per Session: 30 min  Stress: No Stress Concern Present   Feeling of Stress : Not at all  Social Connections: Moderately Integrated   Frequency of Communication with Friends and Family: More than three times a week   Frequency of Social Gatherings with Friends and Family: More than three times a week   Attends Religious Services: More than 4 times per year   Active Member of [employer] or Organizations: Yes   Attends Archivist Meetings: More than 4 times per year   Marital Status: Widowed  Intimate Partner Violence: Not At Risk   Fear of Current or Ex-Partner: No   Emotionally Abused: No   Physically Abused: No   Sexually Abused: No    Outpatient Encounter Medications as of 10/13/2020  Medication Sig   apixaban (ELIQUIS) 5 MG TABS tablet Take 1 tablet (5 mg total) by mouth 2 (two) times daily.   cholecalciferol (VITAMIN D) 1000 units tablet Take 1,000 Units by mouth daily.   cyanocobalamin ([employer] VITAMIN B12) 1000 MCG tablet Take 1 tablet (1,000 mcg total) by mouth daily.   dofetilide (TIKOSYN) 500 MCG capsule Take 1 capsule (500 mcg total) by mouth 2 (two) times daily.   furosemide (LASIX) 40 MG tablet TAKE 1 TABLET (40 MG TOTAL) BY MOUTH DAILY.   furosemide (LASIX) 80 MG tablet Take 1 tablet (80 mg total) by mouth daily for 7 days.    losartan (COZAAR) 25 MG tablet Take 1 tablet (25 mg total) by mouth daily.   omeprazole (PRILOSEC) 10 MG capsule TAKE ONE CAPSULE BY MOUTH ONE TIME DAILY   polyethylene glycol (MIRALAX / GLYCOLAX) packet Take 17 g by mouth as needed for mild constipation.    potassium chloride SA (KLOR-CON) 20 MEQ tablet TAKE 1 TABLET (20 MEQ TOTAL) BY MOUTH DAILY.   rosuvastatin (CRESTOR) 10 MG tablet Take 1 tablet (10 mg total) by mouth daily.   tamsulosin (FLOMAX) 0.4 MG CAPS capsule Take 1 capsule (0.4 mg total) by mouth daily.   No facility-administered encounter medications on file as of 10/13/2020.    No Known Allergies  Review of Systems  Constitutional:  Negative for activity change, appetite change, chills,  diaphoresis, fatigue, fever and unexpected weight change.  HENT: Negative.    Eyes: Negative.   Respiratory:  Positive for shortness of breath (baseline). Negative for apnea, cough, choking, chest tightness, wheezing and stridor.        No orthopnea  Cardiovascular:  Positive for leg swelling. Negative for chest pain and palpitations.  Gastrointestinal:  Negative for abdominal distention, abdominal pain, blood in stool, constipation, diarrhea, nausea and vomiting.  Endocrine: Negative.   Genitourinary:  Negative for decreased urine volume, difficulty urinating, dysuria, frequency, penile swelling, scrotal swelling, testicular pain and urgency.  Musculoskeletal:  Negative for arthralgias and myalgias.  Skin: Negative.   Allergic/Immunologic: Negative.   Neurological:  Negative for dizziness, tremors, seizures, syncope, facial asymmetry, speech difficulty, weakness, light-headedness, numbness and headaches.  Hematological: Negative.   Psychiatric/Behavioral:  Negative for confusion, hallucinations, sleep disturbance and suicidal ideas.   All other systems reviewed and are negative.      Objective:  BP (!) 115/55   Pulse 72   Temp 98.1 F (36.7 C) (Temporal)   Ht _0  (1.702 m)   Wt  170 lb (77.1 kg)   SpO2 (!) 88%   BMI 26.63 kg/m    Wt Readings from Last 3 Encounters:  10/13/20 170 lb (77.1 kg)  10/10/20 171 lb 9.6 oz (77.8 kg)  09/14/20 176 lb (79.8 kg)    Physical Exam Vitals and nursing note reviewed.  Constitutional:      General: He is not in acute distress.    Appearance: Normal appearance. He is well-developed, well-groomed and overweight. He is ill-appearing. He is not toxic-appearing or diaphoretic.     Comments: On continuous O2 via [place]  HENT:     Head: Normocephalic and atraumatic.     Jaw: There is normal jaw occlusion.     Right Ear: Hearing normal.     Left Ear: Hearing normal.     Nose: Nose normal.     Mouth/Throat:     Lips: Pink.     Mouth: Mucous membranes are moist.     Pharynx: Oropharynx is clear. Uvula midline.  Eyes:     General: Lids are normal.     Extraocular Movements: Extraocular movements intact.     Conjunctiva/sclera: Conjunctivae normal.     Pupils: Pupils are equal, round, and reactive to light.  Neck:     Thyroid: No thyroid mass, thyromegaly or thyroid tenderness.     Vascular: No carotid bruit or JVD.     Trachea: Trachea and phonation normal.  Cardiovascular:     Rate and Rhythm: Normal rate and regular rhythm.     Chest Wall: PMI is not displaced.     Pulses: Normal pulses.     Heart sounds: Normal heart sounds. No murmur heard.   No friction rub. No gallop. No S3 or S4 sounds.  Pulmonary:     Effort: Pulmonary effort is normal. No respiratory distress.     Breath sounds: Examination of the right-lower field reveals decreased breath sounds. Examination of the left-lower field reveals decreased breath sounds. Decreased breath sounds present. No wheezing, rhonchi or rales.  Abdominal:     General: Bowel sounds are normal. There is no distension or abdominal bruit.     Palpations: Abdomen is soft. There is no hepatomegaly or splenomegaly.     Tenderness: There is no abdominal tenderness. There is no right CVA  tenderness or left CVA tenderness.     Hernia: No hernia is present.  Musculoskeletal:  General: Normal range of motion.     Cervical back: Normal range of motion and neck supple.     Right lower leg: 2+ Pitting Edema present.     Left lower leg: 2+ Pitting Edema present.  Lymphadenopathy:     Cervical: No cervical adenopathy.  Skin:    General: Skin is warm and dry.     Capillary Refill: Capillary refill takes less than 2 seconds.     Coloration: Skin is not cyanotic, jaundiced or pale.     Findings: No rash.  Neurological:     General: No focal deficit present.     Mental Status: He is alert and oriented to person, place, and time.     Cranial Nerves: Cranial nerves are intact.     Sensory: Sensation is intact.     Motor: Motor function is intact. No weakness.     Coordination: Coordination is intact.     Gait: Gait is intact. Gait normal.     Deep Tendon Reflexes: Reflexes are normal and symmetric.  Psychiatric:        Attention and Perception: Attention and perception normal.        Mood and Affect: Mood and affect normal.        Speech: Speech normal.        Behavior: Behavior normal. Behavior is cooperative.        Thought Content: Thought content normal.        Cognition and Memory: Cognition and memory normal.        Judgment: Judgment normal.    Results for orders placed or performed in visit on 10/10/20  Comprehensive metabolic panel  Result Value Ref Range   Glucose 138 (H) 65 - 99 mg/dL   BUN 30 (H) 8 - 27 mg/dL   Creatinine, Ser 1.08 0.76 - 1.27 mg/dL   eGFR 71 >59 mL/min/1.73   BUN/Creatinine Ratio 28 (H) 10 - 24   Sodium 142 134 - 144 mmol/L   Potassium 4.5 3.5 - 5.2 mmol/L   Chloride 97 96 - 106 mmol/L   CO2 38 (H) 20 - 29 mmol/L   Calcium 8.9 8.6 - 10.2 mg/dL   Total Protein 6.3 6.0 - 8.5 g/dL   Albumin 3.3 (L) 3.7 - 4.7 g/dL   Globulin, Total 3.0 1.5 - 4.5 g/dL   Albumin/Globulin Ratio 1.1 (L) 1.2 - 2.2   Bilirubin Total 1.4 (H) 0.0 - 1.2  mg/dL   Alkaline Phosphatase 57 44 - 121 IU/L   AST 16 0 - 40 IU/L   ALT 10 0 - 44 IU/L  Magnesium  Result Value Ref Range   Magnesium 2.3 1.6 - 2.3 mg/dL       Pertinent labs & imaging results that were available during my care of the patient were reviewed by me and considered in my medical decision making.  Assessment & Plan:  Charli was seen today for leg swelling.  Diagnoses and all orders for this visit:  Bilateral lower extremity edema Chronic diastolic heart failure (HCC) Instructed to increase lasix to 80 mg for next 5 days. Will check BNP and BMP today. Pt aware to limit salt intake to less than 2 gm per day and to weigh daily. Report weight gain of 3 lbs over night and 5 lbs in one week. Pt aware when to seek emergent treatment.  -     BMP8+EGFR -     Brain natriuretic peptide -     furosemide (LASIX) 80 MG  tablet; Take 1 tablet (80 mg total) by mouth daily for 7 days.     Continue all other maintenance medications.  Follow up plan: Return in about 5 days (around 10/18/2020), or if symptoms worsen or fail to improve, for CHF, BMP, BNP.   Continue healthy lifestyle choices, including diet (rich in fruits, vegetables, and lean proteins, and low in salt and simple carbohydrates) and exercise (at least 30 minutes of moderate physical activity daily).  Educational handout given for CHF  The above assessment and management plan was discussed with the patient. The patient verbalized understanding of and has agreed to the management plan. Patient is aware to call the clinic if they develop any new symptoms or if symptoms persist or worsen. Patient is aware when to return to the clinic for a follow-up visit. Patient educated on when it is appropriate to go to the emergency department.   Monia Pouch, FNP-C [facility] [phone]

## 2020-10-14 LAB — BMP8+EGFR
BUN/Creatinine Ratio: 23 (ref 10–24)
BUN: 20 mg/dL (ref 8–27)
CO2: 36 mmol/L — ABNORMAL HIGH (ref 20–29)
Calcium: 9.2 mg/dL (ref 8.6–10.2)
Chloride: 93 mmol/L — ABNORMAL LOW (ref 96–106)
Creatinine, Ser: 0.87 mg/dL (ref 0.76–1.27)
Glucose: 94 mg/dL (ref 65–99)
Potassium: 5.6 mmol/L — ABNORMAL HIGH (ref 3.5–5.2)
Sodium: 142 mmol/L (ref 134–144)
eGFR: 89 mL/min/{1.73_m2} (ref >59)

## 2020-10-14 LAB — BRAIN NATRIURETIC PEPTIDE: BNP: 245.5 pg/mL — ABNORMAL HIGH (ref 0.0–100.0)

## 2020-10-18 NOTE — Progress Notes (Signed)
Yes, he needs to repeat BMP.

## 2020-10-21 ENCOUNTER — Ambulatory Visit: Payer: Medicare PPO | Admitting: Pulmonary Disease

## 2020-11-07 ENCOUNTER — Emergency Department (HOSPITAL_COMMUNITY): Payer: No Typology Code available for payment source

## 2020-11-07 ENCOUNTER — Encounter (HOSPITAL_COMMUNITY): Payer: Self-pay | Admitting: Emergency Medicine

## 2020-11-07 ENCOUNTER — Emergency Department (HOSPITAL_COMMUNITY)
Admission: EM | Admit: 2020-11-07 | Discharge: 2020-11-07 | Disposition: A | Discharge status: Home / Self Care | Payer: No Typology Code available for payment source | Attending: Emergency Medicine | Admitting: Emergency Medicine

## 2020-11-07 ENCOUNTER — Other Ambulatory Visit: Payer: Self-pay

## 2020-11-07 DIAGNOSIS — Z87891 Personal history of nicotine dependence: Secondary | ICD-10-CM | POA: Diagnosis not present

## 2020-11-07 DIAGNOSIS — I5032 Chronic diastolic (congestive) heart failure: Secondary | ICD-10-CM | POA: Insufficient documentation

## 2020-11-07 DIAGNOSIS — J449 Chronic obstructive pulmonary disease, unspecified: Secondary | ICD-10-CM | POA: Insufficient documentation

## 2020-11-07 DIAGNOSIS — I2510 Atherosclerotic heart disease of native coronary artery without angina pectoris: Secondary | ICD-10-CM | POA: Insufficient documentation

## 2020-11-07 DIAGNOSIS — I483 Typical atrial flutter: Secondary | ICD-10-CM | POA: Insufficient documentation

## 2020-11-07 DIAGNOSIS — Z7901 Long term (current) use of anticoagulants: Secondary | ICD-10-CM | POA: Diagnosis not present

## 2020-11-07 DIAGNOSIS — I110 Hypertensive heart disease with heart failure: Secondary | ICD-10-CM | POA: Diagnosis not present

## 2020-11-07 DIAGNOSIS — R0789 Other chest pain: Secondary | ICD-10-CM | POA: Diagnosis present

## 2020-11-07 LAB — CBC WITH DIFFERENTIAL/PLATELET
Abs Immature Granulocytes: 0 10*3/uL (ref 0.00–0.07)
Basophils Absolute: 0 10*3/uL (ref 0.0–0.1)
Basophils Relative: 0 %
Eosinophils Absolute: 0 10*3/uL (ref 0.0–0.5)
Eosinophils Relative: 1 %
HCT: 43.6 % (ref 39.0–52.0)
Hemoglobin: 13.3 g/dL (ref 13.0–17.0)
Immature Granulocytes: 0 %
Lymphocytes Relative: 13 %
Lymphs Abs: 0.6 10*3/uL — ABNORMAL LOW (ref 0.7–4.0)
MCH: 32.4 pg (ref 26.0–34.0)
MCHC: 30.5 g/dL (ref 30.0–36.0)
MCV: 106.1 fL — ABNORMAL HIGH (ref 80.0–100.0)
Monocytes Absolute: 0.4 10*3/uL (ref 0.1–1.0)
Monocytes Relative: 10 %
Neutro Abs: 3.4 10*3/uL (ref 1.7–7.7)
Neutrophils Relative %: 76 %
Platelets: 93 10*3/uL — ABNORMAL LOW (ref 150–400)
RBC: 4.11 MIL/uL — ABNORMAL LOW (ref 4.22–5.81)
RDW: 14 % (ref 11.5–15.5)
WBC: 4.5 10*3/uL (ref 4.0–10.5)
nRBC: 0 % (ref 0.0–0.2)

## 2020-11-07 LAB — COMPREHENSIVE METABOLIC PANEL
ALT: 15 U/L (ref 0–44)
AST: 21 U/L (ref 15–41)
Albumin: 3.4 g/dL — ABNORMAL LOW (ref 3.5–5.0)
Alkaline Phosphatase: 46 U/L (ref 38–126)
Anion gap: 7 (ref 5–15)
BUN: 27 mg/dL — ABNORMAL HIGH (ref 8–23)
CO2: 44 mmol/L — ABNORMAL HIGH (ref 22–32)
Calcium: 8.7 mg/dL — ABNORMAL LOW (ref 8.9–10.3)
Chloride: 89 mmol/L — ABNORMAL LOW (ref 98–111)
Creatinine, Ser: 0.98 mg/dL (ref 0.61–1.24)
GFR, Estimated: >60 mL/min (ref >60)
Glucose, Bld: 96 mg/dL (ref 70–99)
Potassium: 4.5 mmol/L (ref 3.5–5.1)
Sodium: 140 mmol/L (ref 135–145)
Total Bilirubin: 1.3 mg/dL — ABNORMAL HIGH (ref 0.3–1.2)
Total Protein: 6.7 g/dL (ref 6.5–8.1)

## 2020-11-07 LAB — TROPONIN I (HIGH SENSITIVITY)
Troponin I (High Sensitivity): 48 ng/L — ABNORMAL HIGH (ref <18)
Troponin I (High Sensitivity): 47 ng/L — ABNORMAL HIGH (ref <18)

## 2020-11-07 MED ORDER — LOSARTAN POTASSIUM 25 MG PO TABS
25.0000 mg | ORAL_TABLET | Freq: Once | ORAL | Status: AC
  Administered 2020-11-07: 25 mg via ORAL
  Filled 2020-11-07: qty 1

## 2020-11-07 MED ORDER — DOFETILIDE 500 MCG PO CAPS
500.0000 ug | ORAL_CAPSULE | Freq: Once | ORAL | Status: AC
  Administered 2020-11-07: 500 ug via ORAL
  Filled 2020-11-07: qty 1

## 2020-11-07 NOTE — Discharge Instructions (Addendum)
Follow-up with your cardiologist within the week.  Return if any problem

## 2020-11-07 NOTE — ED Provider Notes (Signed)
[facility] EMERGENCY DEPARTMENT Provider Note   CSN: [id number] Arrival date & time: 11/07/20  0947     History Chief Complaint  Patient presents with   Chest Pain    Joshua [name] is a 75 y.o. male.  Patient has had some chest discomfort.  He has a history of atrial flutter.  The history is provided by the patient and medical records. No language interpreter was used.  Chest Pain Pain location:  L chest Pain quality: aching   Pain radiates to:  Does not radiate Pain severity:  Mild Onset quality:  Gradual Timing:  Intermittent Progression:  Waxing and waning Chronicity:  New Context: not breathing   Relieved by:  Nothing Worsened by:  Nothing Associated symptoms: no abdominal pain, no back pain, no cough, no fatigue and no headache       Past Medical History:  Diagnosis Date   Acute lower GI bleeding    related to gastritis / viral infection   Cataract    Cirrhosis ([place])    COPD (chronic obstructive pulmonary disease) (HCC)    PFT 05-05-2010, FEV1 .9 (26%) ratio 60   Coronary artery disease    Gastritis 06-10-2006   Hiatal hernia 06-10-2006   EGD   HOH (hard of hearing)    Hx of adenomatous colonic polyps 2005, 2013   Colonoscopy   Hypercholesteremia    Hypertension    Hypertr obst cardiomyop    mild subaortic stenosis   Iron deficiency anemia, unspecified    LVH (left ventricular hypertrophy)    Other B-complex deficiencies    Persistent atrial fibrillation (HCC)    persistent   Rheumatic fever    Thrombocytopenia ([place]) 05/2008    Patient Active Problem List   Diagnosis Date Noted   Chronic diastolic heart failure ([place]) 10/13/2020   Chronic obstructive pulmonary disease, unspecified ([place]) 09/14/2020   Splenic infarct    Rectal bleeding 09/05/2020   Adenomatous polyps 12/13/2016   Long term (current) use of anticoagulants [Z79.01] 06/14/2016   CAD in native artery 01/17/2016   HOCM (hypertrophic obstructive cardiomyopathy) ([place]) 07/16/2015    Hepatic cirrhosis ([place])    Syncope and collapse 01/14/2015   Erectile dysfunction 11/10/2014   Thrombocytopenia ([place]) 01/30/2014   Hyperlipidemia LDL goal <70 12/03/2013   High risk medication use 05/21/2013   BPH (benign prostatic hyperplasia) 01/29/2013   Family history of colon cancer 01/29/2013   PAF (paroxysmal atrial fibrillation) ([place]) 12/17/2012   Aortic valve insufficiency 12/17/2012   Mild mitral stenosis 12/17/2012   Hypertrophic obstructive cardiomyopathy ([place]) 03/31/2010   B12 deficiency 10/11/2008   Coronary atherosclerosis 10/11/2008   HIATAL HERNIA WITH REFLUX 10/11/2008   COLONIC POLYPS, ADENOMATOUS, HX OF 10/11/2008    Past Surgical History:  Procedure Laterality Date   ATRIAL FLUTTER ABLATION  06/2010   CTI ablation performed by [name]   CARDIAC CATHETERIZATION  05/28/2005   Widely patent coronaries and normal LV function.   CARDIAC CATHETERIZATION  08/11/2001   80% LAD stenosis successfully stented with a 3.5x72m Cypher DES postdilated to 3.950mresulting in reduction of 80% to 0%.   CARDIAC CATHETERIZATION  01/15/2001   Focal 95% proximal RCA stenosis, stented with a [id number] multilink stent with postdilatation utilizing a 3.5x88m82mantum balloon with stenosis being reduced to 0%.   CARDIOVASCULAR STRESS TEST  05/29/2011   No scintigraphic evidence of inducible myocardial ischemia. No ECG changes. EKG negative for ischemia.   CARDIOVERSION N/A 10/20/2019   Procedure: CARDIOVERSION;  Surgeon: Croitoru,  Dani Gobble, MD;  Location: [place] [facility];  Service: Cardiovascular;  Laterality: N/A;   CARDIOVERSION N/A 01/14/2020   Procedure: CARDIOVERSION;  Surgeon: Buford Dresser, MD;  Location: [facility] ENDOSCOPY;  Service: Cardiovascular;  Laterality: N/A;   CORONARY ANGIOPLASTY     2002-2003   SHOULDER SURGERY     TRANSTHORACIC ECHOCARDIOGRAM  05/29/2011   EF >70%, severe concentric LV hypertrophy, severe mitral annular calcification, mild-moderate aortic regurg,         Family History  Problem Relation Age of Onset   Heart attack Brother 60       Ventircular rupture   Emphysema Brother    Lung cancer Brother    Heart disease Mother        Enlarged heart   Stroke Mother    Hyperlipidemia Father    CAD Father    Hypertension Sister    CAD Paternal Uncle    Stroke Paternal Uncle    Cancer Maternal Grandfather        colon   Colon cancer Maternal Grandfather    Asthma Brother     Social History   Tobacco Use   Smoking status: Former    Packs/day: 1.00    Years: 10.00    Pack years: 10.00    Types: Cigarettes    Quit date: 02/26/1985    Years since quitting: 35.7   Smokeless tobacco: Never  Vaping Use   Vaping Use: Never used  Substance Use Topics   Alcohol use: No   Drug use: No    Home Medications Prior to Admission medications   Medication Sig Start Date End Date Taking? Authorizing Provider  apixaban (ELIQUIS) 5 MG TABS tablet Take 1 tablet (5 mg total) by mouth 2 (two) times daily. 12/10/19  Yes Allred, Jeneen Rinks, MD  cholecalciferol (VITAMIN D) 1000 units tablet Take 1,000 Units by mouth daily.   Yes [provider]  cyanocobalamin ([facility] VITAMIN B12) 1000 MCG tablet Take 1 tablet (1,000 mcg total) by mouth daily. 11/24/14  Yes Cherre Robins, PharmD  dofetilide (TIKOSYN) 500 MCG capsule Take 1 capsule (500 mcg total) by mouth 2 (two) times daily. 02/14/20  Yes Shirley Friar, PA-C  furosemide (LASIX) 40 MG tablet TAKE 1 TABLET (40 MG TOTAL) BY MOUTH DAILY. Patient taking differently: Take 40 mg by mouth daily. 01/15/20 01/14/21 Yes TillerySatira Mccallum, PA-C  losartan (COZAAR) 25 MG tablet Take 1 tablet (25 mg total) by mouth daily. 09/06/20  Yes Barton Dubois, MD  omeprazole (PRILOSEC) 10 MG capsule TAKE ONE CAPSULE BY MOUTH ONE TIME DAILY Patient taking differently: Take 10 mg by mouth daily. 07/06/20  Yes [name], [name], MD  polyethylene glycol (MIRALAX / GLYCOLAX) packet Take 17 g by mouth as needed  for mild constipation.    Yes [provider]  potassium chloride SA (KLOR-CON) 20 MEQ tablet TAKE 1 TABLET (20 MEQ TOTAL) BY MOUTH DAILY. Patient taking differently: Take 20 mEq by mouth daily. 01/15/20 01/14/21 Yes TillerySatira Mccallum, PA-C  rosuvastatin (CRESTOR) 10 MG tablet Take 1 tablet (10 mg total) by mouth daily. 05/03/16  Yes Chipper Herb, MD  tamsulosin (FLOMAX) 0.4 MG CAPS capsule Take 1 capsule (0.4 mg total) by mouth daily. 06/13/20  Yes [name], [name], MD  furosemide (LASIX) 80 MG tablet Take 1 tablet (80 mg total) by mouth daily for 7 days. Patient not taking: Reported on 11/07/2020 10/13/20 10/20/20  Baruch Gouty, FNP    Allergies    Patient has no known allergies.  Review of Systems   Review of Systems  Constitutional:  Negative for appetite change and fatigue.  HENT:  Negative for congestion, ear discharge and sinus pressure.   Eyes:  Negative for discharge.  Respiratory:  Negative for cough.   Cardiovascular:  Positive for chest pain.  Gastrointestinal:  Negative for abdominal pain and diarrhea.  Genitourinary:  Negative for frequency and hematuria.  Musculoskeletal:  Negative for back pain.  Skin:  Negative for rash.  Neurological:  Negative for seizures and headaches.  Psychiatric/Behavioral:  Negative for hallucinations.    Physical Exam Updated Vital Signs BP (!) 123/53   Pulse (!) 56   Temp 98 F (36.7 C) (Oral)   Resp (!) 22   Ht '5\' 9"'$  (1.753 m)   Wt 77 kg   SpO2 98%   BMI 25.07 kg/m   Physical Exam Vitals and nursing note reviewed.  Constitutional:      Appearance: He is well-developed.  HENT:     Head: Normocephalic.     Nose: Nose normal.  Eyes:     General: No scleral icterus.    Conjunctiva/sclera: Conjunctivae normal.  Neck:     Thyroid: No thyromegaly.  Cardiovascular:     Rate and Rhythm: Normal rate. Rhythm irregular.     Heart sounds: No murmur heard.   No friction rub. No gallop.  Pulmonary:     Breath  sounds: No stridor. No wheezing or rales.  Chest:     Chest wall: No tenderness.  Abdominal:     General: There is no distension.     Tenderness: There is no abdominal tenderness. There is no rebound.  Musculoskeletal:        General: Normal range of motion.     Cervical back: Neck supple.  Lymphadenopathy:     Cervical: No cervical adenopathy.  Skin:    Findings: No erythema or rash.  Neurological:     Mental Status: He is alert and oriented to person, place, and time.     Motor: No abnormal muscle tone.     Coordination: Coordination normal.  Psychiatric:        Behavior: Behavior normal.    ED Results / Procedures / Treatments   Labs (all labs ordered are listed, but only abnormal results are displayed) Labs Reviewed  CBC WITH DIFFERENTIAL/PLATELET - Abnormal; Notable for the following components:      Result Value   RBC 4.11 (*)    MCV 106.1 (*)    Platelets 93 (*)    Lymphs Abs 0.6 (*)    All other components within normal limits  COMPREHENSIVE METABOLIC PANEL - Abnormal; Notable for the following components:   Chloride 89 (*)    CO2 44 (*)    BUN 27 (*)    Calcium 8.7 (*)    Albumin 3.4 (*)    Total Bilirubin 1.3 (*)    All other components within normal limits  TROPONIN I (HIGH SENSITIVITY) - Abnormal; Notable for the following components:   Troponin I (High Sensitivity) 48 (*)    All other components within normal limits  TROPONIN I (HIGH SENSITIVITY) - Abnormal; Notable for the following components:   Troponin I (High Sensitivity) 47 (*)    All other components within normal limits    EKG None  Radiology DG Chest Port 1 View  Result Date: 11/07/2020 CLINICAL DATA:  AFib. EXAM: PORTABLE CHEST 1 VIEW COMPARISON:  09/05/2020 FINDINGS: 1102 hours. The cardio pericardial silhouette is enlarged. Interstitial markings are  diffusely coarsened with chronic features. Similar patchy opacity at the lung bases suggesting scarring given interval stability. Small  bilateral pleural effusions evident. The visualized bony structures of the thorax show no acute abnormality. Telemetry leads overlie the chest. IMPRESSION: Stable. Chronic interstitial lung disease with bibasilar scarring and small bilateral pleural effusions. Electronically Signed   By: Misty Stanley M.D.   On: 11/07/2020 11:31    Procedures Procedures   Medications Ordered in ED Medications  dofetilide (TIKOSYN) capsule 500 mcg (500 mcg Oral Given 11/07/20 1118)  losartan (COZAAR) tablet 25 mg (25 mg Oral Given 11/07/20 1118)    ED Course  I have reviewed the triage vital signs and the nursing notes.  Pertinent labs & imaging results that were available during my care of the patient were reviewed by me and considered in my medical decision making (see chart for details). Patient with chest discomfort and atrial flutter.  Troponins have been stable.  I spoke with cardiology and they feel like the patient can follow-up as an outpatient   MDM Rules/Calculators/A&P    Patient labs unremarkable.  He is stable for discharge and will follow up with his cardiologist next week                       Final Clinical Impression(s) / ED Diagnoses Final diagnoses:  Typical atrial flutter [facility])    Rx / DC Orders ED Discharge Orders     None        Milton Ferguson, MD 11/10/20 1015

## 2020-11-07 NOTE — ED Triage Notes (Signed)
Pt c/o afib he states hes been in afib since lastnight. He states he had had some cp "off and on".

## 2020-11-18 NOTE — Progress Notes (Deleted)
PCP:  [name], [name], MD Primary Cardiologist: Shelva Majestic, MD Electrophysiologist: Thompson Grayer, MD   Joshua [name] is a 75 y.o. male seen today for Thompson Grayer, MD for routine electrophysiology followup.  Since last being seen in our clinic the patient reports doing ***  . He can get around a grocery pushing a cart without DOE. Mild SOB with hills or inclines, but overall doing OK. Feels like he is in and out of rhythm at times. Does not feel that he is out of rhythm today. Had swelling start over the weekend when he held lasix for church.  he denies chest pain, PND, orthopnea, nausea, vomiting, dizziness, syncope,  weight gain, or early satiety.  Past Medical History:  Diagnosis Date   Acute lower GI bleeding    related to gastritis / viral infection   Cataract    Cirrhosis ([place])    COPD (chronic obstructive pulmonary disease) ([place])    PFT 05-05-2010, FEV1 .9 (26%) ratio 60   Coronary artery disease    Gastritis 06-10-2006   Hiatal hernia 06-10-2006   EGD   HOH (hard of hearing)    Hx of adenomatous colonic polyps 2005, 2013   Colonoscopy   Hypercholesteremia    Hypertension    Hypertr obst cardiomyop    mild subaortic stenosis   Iron deficiency anemia, unspecified    LVH (left ventricular hypertrophy)    Other B-complex deficiencies    Persistent atrial fibrillation (HCC)    persistent   Rheumatic fever    Thrombocytopenia ([place]) 05/2008   Past Surgical History:  Procedure Laterality Date   ATRIAL FLUTTER ABLATION  06/2010   CTI ablation performed by [name]   CARDIAC CATHETERIZATION  05/28/2005   Widely patent coronaries and normal LV function.   CARDIAC CATHETERIZATION  08/11/2001   80% LAD stenosis successfully stented with a 3.5x62mm Cypher DES postdilated to 3.5mm resulting in reduction of 80% to 0%.   CARDIAC CATHETERIZATION  01/15/2001   Focal 95% proximal RCA stenosis, stented with a 3x34mm Zeta multilink stent with postdilatation utilizing a 3.5x16mm  Quantum balloon with stenosis being reduced to 0%.   CARDIOVASCULAR STRESS TEST  05/29/2011   No scintigraphic evidence of inducible myocardial ischemia. No ECG changes. EKG negative for ischemia.   CARDIOVERSION N/A 10/20/2019   Procedure: CARDIOVERSION;  Surgeon: Sanda Klein, MD;  Location: [place];  Service: Cardiovascular;  Laterality: N/A;   CARDIOVERSION N/A 01/14/2020   Procedure: CARDIOVERSION;  Surgeon: Buford Dresser, MD;  Location: [facility];  Service: Cardiovascular;  Laterality: N/A;   CORONARY ANGIOPLASTY     2002-2003   SHOULDER SURGERY     TRANSTHORACIC ECHOCARDIOGRAM  05/29/2011   EF >70%, severe concentric LV hypertrophy, severe mitral annular calcification, mild-moderate aortic regurg,     Current Outpatient Medications  Medication Sig Dispense Refill   apixaban (ELIQUIS) 5 MG TABS tablet Take 1 tablet (5 mg total) by mouth 2 (two) times daily. 180 tablet 1   cholecalciferol (VITAMIN D) 1000 units tablet Take 1,000 Units by mouth daily.     cyanocobalamin ([employer] VITAMIN B12) 1000 MCG tablet Take 1 tablet (1,000 mcg total) by mouth daily.     dofetilide (TIKOSYN) 500 MCG capsule Take 1 capsule (500 mcg total) by mouth 2 (two) times daily. 60 capsule 6   furosemide (LASIX) 40 MG tablet TAKE 1 TABLET (40 MG TOTAL) BY MOUTH DAILY. (Patient taking differently: Take 40 mg by mouth daily.) 30 tablet 0   furosemide (LASIX)  80 MG tablet Take 1 tablet (80 mg total) by mouth daily for 7 days. (Patient not taking: Reported on 11/07/2020) 7 tablet 0   losartan (COZAAR) 25 MG tablet Take 1 tablet (25 mg total) by mouth daily.     omeprazole (PRILOSEC) 10 MG capsule TAKE ONE CAPSULE BY MOUTH ONE TIME DAILY (Patient taking differently: Take 10 mg by mouth daily.) 90 capsule 0   polyethylene glycol (MIRALAX / GLYCOLAX) packet Take 17 g by mouth as needed for mild constipation.      potassium chloride SA (KLOR-CON) 20 MEQ tablet TAKE 1 TABLET (20 MEQ TOTAL) BY MOUTH DAILY.  (Patient taking differently: Take 20 mEq by mouth daily.) 30 tablet 0   rosuvastatin (CRESTOR) 10 MG tablet Take 1 tablet (10 mg total) by mouth daily. 90 tablet 3   tamsulosin (FLOMAX) 0.4 MG CAPS capsule Take 1 capsule (0.4 mg total) by mouth daily. 90 capsule 3   No current facility-administered medications for this visit.    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  Tobacco Use   Smoking status: Former    Packs/day: 1.00    Years: 10.00    Pack years: 10.00    Types: Cigarettes    Quit date: 02/26/1985    Years since quitting: 35.7   Smokeless tobacco: Never  Vaping Use   Vaping Use: Never used  Substance and Sexual Activity   Alcohol use: No   Drug use: No   Sexual activity: Not on file  Other Topics Concern   Not on file  Social History Narrative   Daughter lives with him   Social Determinants of Health   Financial Resource Strain: Low Risk    Difficulty of Paying Living Expenses: Not hard at all  Food Insecurity: No Food Insecurity   Worried About Charity fundraiser in the Last Year: Never true   Arboriculturist in the Last Year: Never true  Transportation Needs: No Transportation Needs   Lack of Transportation (Medical): No   Lack of Transportation (Non-Medical): No  Physical Activity: Sufficiently Active   Days of Exercise per Week: 7 days   Minutes of Exercise per Session: 30 min  Stress: No Stress Concern Present   Feeling of Stress : Not at all  Social Connections: Moderately Integrated   Frequency of Communication with Friends and Family: More than three times a week   Frequency of Social Gatherings with Friends and Family: More than three times a week   Attends Religious Services: More than 4 times  per year   Active Member of [employer] or Organizations: Yes   Attends Archivist Meetings: More than 4 times per year   Marital Status: Widowed  Human resources officer Violence: Not At Risk   Fear of Current or Ex-Partner: No   Emotionally Abused: No   Physically Abused: No   Sexually Abused: No     Review of Systems: Review of systems complete and found to be negative unless listed in HPI.    Physical Exam: There were no vitals filed for this visit.  General:  Well appearing. No resp difficulty. HEENT: Normal Neck: Supple. JVP 5-6. Carotids 2+ bilat; no bruits.  No thyromegaly or nodule noted. Cor: PMI nondisplaced. RRR, No M/G/R noted Lungs: CTAB, normal effort. Abdomen: Soft, non-tender, non-distended, no HSM. No bruits or masses. +BS   Extremities: No cyanosis, clubbing, or rash. R and LLE no edema.  Neuro: Alert & orientedx3, cranial nerves grossly intact. moves all 4 extremities w/o difficulty. Affect pleasant   EKG is ordered today. Personal review of EKG today shows ***  Additional studies reviewed include: Previous EP office notes.   Assessment and Plan:  1. Persistent atrial fibrillation / atypical atrial flutter EKG shows ***  Continue Tikosyn 500 BID for now.  Continue Eliquis for CHA2DS2VASC of at least 4.   BMET and Mg today. His rates are well controlled. I think more likely he is out of rhythm persistently, but he has "good days".  Discussed with [name].   Options are to  1. stop tikosyn and pursue control alone  2. stop tikosyn and try amiodarone.   Discussed these options at length with patient. We discussed monitoring with kardia mobile if he thinks he's going in and out of rhythm. He feels overall OK and is hesitant to stop tikosyn. Will follow back up in 6-8 weeks to further discuss.  Will forward chart to Pulm to see if they think amiodarone would aggravate his specific lung issues.  He is not an ablation candidate with extensive valvular  disease and COPD.  I again discussed with the patient today I feel his arrhythmias are not the primary source of his malaise, and even aggressive management of his fib/flutter may still be unlikely to provide clinical improvement.   2. Chronic chronic diastolic CHF Volume status *** Continue lasix 40 mg daily.  Encouraged fluid restriction to <2L daily, and sodium restriction.  Confounded by chronic venous stasis.    3. COPD with chronic hypoxic respiratory failure Continue O2. He is following pulmonary.  PFTs with restrictive lung disease likely from pleural effusions that developed into chronic fibrothoraces per notes.  Sleep study results have still not been signed. I discussed with Pulmonary who do not think his specific brand of lung disease would be especially exacerbated by amiodarone  4. Extensive valvular disease and HOCM Moderate to severe AI, Moderate MS, and HOCM likely with outflow obstruction Review echo and case at length with Dr. Burt Knack previously. Suspect any surgery would require multiple valves AND myectomy with significantly elevated risk of mortality, if not prohibitive.  Pt states he is not interested in considering surgical means at this times, and understands he will likely not be candidate. Not candidate for HCM drug either.   Shirley Friar, PA-C  11/18/20 1:20 PM

## 2020-11-21 ENCOUNTER — Ambulatory Visit (INDEPENDENT_AMBULATORY_CARE_PROVIDER_SITE_OTHER): Payer: Medicare PPO

## 2020-11-21 ENCOUNTER — Emergency Department (HOSPITAL_COMMUNITY): Payer: No Typology Code available for payment source

## 2020-11-21 ENCOUNTER — Inpatient Hospital Stay (HOSPITAL_COMMUNITY)
Admission: EM | Admit: 2020-11-21 | Discharge: 2020-11-29 | DRG: 291 | Disposition: A | Discharge status: Hospice | Payer: No Typology Code available for payment source | Attending: Cardiovascular Disease | Admitting: Cardiovascular Disease

## 2020-11-21 ENCOUNTER — Other Ambulatory Visit: Payer: Self-pay

## 2020-11-21 ENCOUNTER — Ambulatory Visit: Payer: Medicare PPO | Admitting: Student

## 2020-11-21 ENCOUNTER — Encounter (HOSPITAL_COMMUNITY): Payer: Self-pay

## 2020-11-21 ENCOUNTER — Ambulatory Visit (INDEPENDENT_AMBULATORY_CARE_PROVIDER_SITE_OTHER): Payer: Medicare PPO | Admitting: Family Medicine

## 2020-11-21 ENCOUNTER — Telehealth: Payer: Self-pay | Admitting: Cardiovascular Disease

## 2020-11-21 VITALS — BP 92/35 | HR 62 | Temp 98.0°F | Wt 169.0 lb

## 2020-11-21 DIAGNOSIS — Z801 Family history of malignant neoplasm of trachea, bronchus and lung: Secondary | ICD-10-CM

## 2020-11-21 DIAGNOSIS — I421 Obstructive hypertrophic cardiomyopathy: Secondary | ICD-10-CM | POA: Diagnosis present

## 2020-11-21 DIAGNOSIS — Z7901 Long term (current) use of anticoagulants: Secondary | ICD-10-CM

## 2020-11-21 DIAGNOSIS — I4892 Unspecified atrial flutter: Secondary | ICD-10-CM | POA: Diagnosis present

## 2020-11-21 DIAGNOSIS — Z20822 Contact with and (suspected) exposure to covid-19: Secondary | ICD-10-CM | POA: Diagnosis present

## 2020-11-21 DIAGNOSIS — H9190 Unspecified hearing loss, unspecified ear: Secondary | ICD-10-CM | POA: Diagnosis present

## 2020-11-21 DIAGNOSIS — K7460 Unspecified cirrhosis of liver: Secondary | ICD-10-CM | POA: Diagnosis present

## 2020-11-21 DIAGNOSIS — E7800 Pure hypercholesterolemia, unspecified: Secondary | ICD-10-CM | POA: Diagnosis present

## 2020-11-21 DIAGNOSIS — I422 Other hypertrophic cardiomyopathy: Secondary | ICD-10-CM | POA: Diagnosis not present

## 2020-11-21 DIAGNOSIS — I2510 Atherosclerotic heart disease of native coronary artery without angina pectoris: Secondary | ICD-10-CM | POA: Diagnosis present

## 2020-11-21 DIAGNOSIS — E119 Type 2 diabetes mellitus without complications: Secondary | ICD-10-CM | POA: Diagnosis present

## 2020-11-21 DIAGNOSIS — I350 Nonrheumatic aortic (valve) stenosis: Secondary | ICD-10-CM

## 2020-11-21 DIAGNOSIS — I38 Endocarditis, valve unspecified: Secondary | ICD-10-CM | POA: Diagnosis not present

## 2020-11-21 DIAGNOSIS — I5033 Acute on chronic diastolic (congestive) heart failure: Secondary | ICD-10-CM | POA: Diagnosis present

## 2020-11-21 DIAGNOSIS — Z7189 Other specified counseling: Secondary | ICD-10-CM | POA: Diagnosis not present

## 2020-11-21 DIAGNOSIS — I509 Heart failure, unspecified: Secondary | ICD-10-CM

## 2020-11-21 DIAGNOSIS — I351 Nonrheumatic aortic (valve) insufficiency: Secondary | ICD-10-CM | POA: Diagnosis not present

## 2020-11-21 DIAGNOSIS — R600 Localized edema: Secondary | ICD-10-CM | POA: Diagnosis not present

## 2020-11-21 DIAGNOSIS — R531 Weakness: Secondary | ICD-10-CM | POA: Diagnosis not present

## 2020-11-21 DIAGNOSIS — Z83438 Family history of other disorder of lipoprotein metabolism and other lipidemia: Secondary | ICD-10-CM

## 2020-11-21 DIAGNOSIS — Z9981 Dependence on supplemental oxygen: Secondary | ICD-10-CM

## 2020-11-21 DIAGNOSIS — I440 Atrioventricular block, first degree: Secondary | ICD-10-CM | POA: Diagnosis not present

## 2020-11-21 DIAGNOSIS — I071 Rheumatic tricuspid insufficiency: Secondary | ICD-10-CM | POA: Diagnosis not present

## 2020-11-21 DIAGNOSIS — I083 Combined rheumatic disorders of mitral, aortic and tricuspid valves: Secondary | ICD-10-CM | POA: Diagnosis present

## 2020-11-21 DIAGNOSIS — I5022 Chronic systolic (congestive) heart failure: Secondary | ICD-10-CM

## 2020-11-21 DIAGNOSIS — I342 Nonrheumatic mitral (valve) stenosis: Secondary | ICD-10-CM | POA: Diagnosis not present

## 2020-11-21 DIAGNOSIS — Z23 Encounter for immunization: Secondary | ICD-10-CM

## 2020-11-21 DIAGNOSIS — D696 Thrombocytopenia, unspecified: Secondary | ICD-10-CM | POA: Diagnosis present

## 2020-11-21 DIAGNOSIS — D7589 Other specified diseases of blood and blood-forming organs: Secondary | ICD-10-CM | POA: Diagnosis not present

## 2020-11-21 DIAGNOSIS — J9612 Chronic respiratory failure with hypercapnia: Secondary | ICD-10-CM | POA: Diagnosis present

## 2020-11-21 DIAGNOSIS — J9811 Atelectasis: Secondary | ICD-10-CM | POA: Diagnosis not present

## 2020-11-21 DIAGNOSIS — Z87891 Personal history of nicotine dependence: Secondary | ICD-10-CM

## 2020-11-21 DIAGNOSIS — J9611 Chronic respiratory failure with hypoxia: Secondary | ICD-10-CM | POA: Diagnosis present

## 2020-11-21 DIAGNOSIS — Z515 Encounter for palliative care: Secondary | ICD-10-CM | POA: Diagnosis not present

## 2020-11-21 DIAGNOSIS — M7989 Other specified soft tissue disorders: Secondary | ICD-10-CM

## 2020-11-21 DIAGNOSIS — I272 Pulmonary hypertension, unspecified: Secondary | ICD-10-CM | POA: Diagnosis present

## 2020-11-21 DIAGNOSIS — I4819 Other persistent atrial fibrillation: Secondary | ICD-10-CM | POA: Diagnosis present

## 2020-11-21 DIAGNOSIS — E873 Alkalosis: Secondary | ICD-10-CM | POA: Diagnosis present

## 2020-11-21 DIAGNOSIS — Z800 Family history of malignant neoplasm of digestive organs: Secondary | ICD-10-CM

## 2020-11-21 DIAGNOSIS — I110 Hypertensive heart disease with heart failure: Secondary | ICD-10-CM | POA: Diagnosis present

## 2020-11-21 DIAGNOSIS — I2583 Coronary atherosclerosis due to lipid rich plaque: Secondary | ICD-10-CM | POA: Diagnosis not present

## 2020-11-21 DIAGNOSIS — Z66 Do not resuscitate: Secondary | ICD-10-CM | POA: Diagnosis not present

## 2020-11-21 DIAGNOSIS — R0602 Shortness of breath: Secondary | ICD-10-CM

## 2020-11-21 DIAGNOSIS — J811 Chronic pulmonary edema: Secondary | ICD-10-CM | POA: Diagnosis not present

## 2020-11-21 DIAGNOSIS — I340 Nonrheumatic mitral (valve) insufficiency: Secondary | ICD-10-CM

## 2020-11-21 DIAGNOSIS — J449 Chronic obstructive pulmonary disease, unspecified: Secondary | ICD-10-CM | POA: Diagnosis not present

## 2020-11-21 DIAGNOSIS — I359 Nonrheumatic aortic valve disorder, unspecified: Secondary | ICD-10-CM | POA: Diagnosis not present

## 2020-11-21 DIAGNOSIS — Z8249 Family history of ischemic heart disease and other diseases of the circulatory system: Secondary | ICD-10-CM

## 2020-11-21 DIAGNOSIS — R609 Edema, unspecified: Secondary | ICD-10-CM

## 2020-11-21 DIAGNOSIS — I10 Essential (primary) hypertension: Secondary | ICD-10-CM | POA: Diagnosis not present

## 2020-11-21 DIAGNOSIS — Z955 Presence of coronary angioplasty implant and graft: Secondary | ICD-10-CM

## 2020-11-21 DIAGNOSIS — Z823 Family history of stroke: Secondary | ICD-10-CM

## 2020-11-21 DIAGNOSIS — Z825 Family history of asthma and other chronic lower respiratory diseases: Secondary | ICD-10-CM

## 2020-11-21 DIAGNOSIS — Z28310 Unvaccinated for covid-19: Secondary | ICD-10-CM

## 2020-11-21 DIAGNOSIS — I480 Paroxysmal atrial fibrillation: Secondary | ICD-10-CM | POA: Diagnosis not present

## 2020-11-21 DIAGNOSIS — Z79899 Other long term (current) drug therapy: Secondary | ICD-10-CM

## 2020-11-21 DIAGNOSIS — E539 Vitamin B deficiency, unspecified: Secondary | ICD-10-CM | POA: Diagnosis present

## 2020-11-21 DIAGNOSIS — I361 Nonrheumatic tricuspid (valve) insufficiency: Secondary | ICD-10-CM | POA: Diagnosis not present

## 2020-11-21 HISTORY — DX: Heart failure, unspecified: I50.9

## 2020-11-21 LAB — COMPREHENSIVE METABOLIC PANEL
ALT: 10 U/L (ref 0–44)
AST: 27 U/L (ref 15–41)
Albumin: 3 g/dL — ABNORMAL LOW (ref 3.5–5.0)
Alkaline Phosphatase: 40 U/L (ref 38–126)
Anion gap: 5 (ref 5–15)
BUN: 39 mg/dL — ABNORMAL HIGH (ref 8–23)
CO2: 43 mmol/L — ABNORMAL HIGH (ref 22–32)
Calcium: 8.4 mg/dL — ABNORMAL LOW (ref 8.9–10.3)
Chloride: 91 mmol/L — ABNORMAL LOW (ref 98–111)
Creatinine, Ser: 1.05 mg/dL (ref 0.61–1.24)
GFR, Estimated: >60 mL/min (ref >60)
Glucose, Bld: 97 mg/dL (ref 70–99)
Potassium: 4.3 mmol/L (ref 3.5–5.1)
Sodium: 139 mmol/L (ref 135–145)
Total Bilirubin: 1.2 mg/dL (ref 0.3–1.2)
Total Protein: 6.3 g/dL — ABNORMAL LOW (ref 6.5–8.1)

## 2020-11-21 LAB — CBC WITH DIFFERENTIAL/PLATELET
Abs Immature Granulocytes: 0.02 10*3/uL (ref 0.00–0.07)
Basophils Absolute: 0 10*3/uL (ref 0.0–0.1)
Basophils Relative: 0 %
Eosinophils Absolute: 0.1 10*3/uL (ref 0.0–0.5)
Eosinophils Relative: 1 %
HCT: 43.6 % (ref 39.0–52.0)
Hemoglobin: 13.3 g/dL (ref 13.0–17.0)
Immature Granulocytes: 0 %
Lymphocytes Relative: 12 %
Lymphs Abs: 0.8 10*3/uL (ref 0.7–4.0)
MCH: 32.7 pg (ref 26.0–34.0)
MCHC: 30.5 g/dL (ref 30.0–36.0)
MCV: 107.1 fL — ABNORMAL HIGH (ref 80.0–100.0)
Monocytes Absolute: 0.7 10*3/uL (ref 0.1–1.0)
Monocytes Relative: 10 %
Neutro Abs: 5.2 10*3/uL (ref 1.7–7.7)
Neutrophils Relative %: 77 %
Platelets: 126 10*3/uL — ABNORMAL LOW (ref 150–400)
RBC: 4.07 MIL/uL — ABNORMAL LOW (ref 4.22–5.81)
RDW: 15.6 % — ABNORMAL HIGH (ref 11.5–15.5)
WBC: 6.8 10*3/uL (ref 4.0–10.5)
nRBC: 0 % (ref 0.0–0.2)

## 2020-11-21 LAB — RESP PANEL BY RT-PCR (FLU A&B, COVID) ARPGX2
Influenza A by PCR: NEGATIVE
Influenza B by PCR: NEGATIVE
SARS Coronavirus 2 by RT PCR: NEGATIVE

## 2020-11-21 LAB — BRAIN NATRIURETIC PEPTIDE: B Natriuretic Peptide: 391 pg/mL — ABNORMAL HIGH (ref 0.0–100.0)

## 2020-11-21 MED ORDER — EMPAGLIFLOZIN 10 MG PO TABS
10.0000 mg | ORAL_TABLET | Freq: Every day | ORAL | Status: DC
  Administered 2020-11-22: 10 mg via ORAL
  Filled 2020-11-21: qty 1

## 2020-11-21 MED ORDER — ACETAMINOPHEN 325 MG PO TABS: 650.0000 mg | ORAL_TABLET | ORAL | Status: DC | PRN

## 2020-11-21 MED ORDER — ROSUVASTATIN CALCIUM 5 MG PO TABS
10.0000 mg | ORAL_TABLET | Freq: Every day | ORAL | Status: DC
  Administered 2020-11-22 – 2020-11-29 (×8): 10 mg via ORAL
  Filled 2020-11-21 (×6): qty 2
  Filled 2020-11-21: qty 1
  Filled 2020-11-21 (×2): qty 2

## 2020-11-21 MED ORDER — SODIUM CHLORIDE 0.9% FLUSH
3.0000 mL | Freq: Two times a day (BID) | INTRAVENOUS | Status: DC
  Administered 2020-11-21 – 2020-11-29 (×15): 3 mL via INTRAVENOUS

## 2020-11-21 MED ORDER — SODIUM CHLORIDE 0.9% FLUSH: 3.0000 mL | INTRAVENOUS | Status: DC | PRN

## 2020-11-21 MED ORDER — SODIUM CHLORIDE 0.9 % IV SOLN: 250.0000 mL | INTRAVENOUS | Status: DC | PRN

## 2020-11-21 MED ORDER — POLYETHYLENE GLYCOL 3350 17 G PO PACK: 17.0000 g | PACK | ORAL | Status: DC | PRN

## 2020-11-21 MED ORDER — LOSARTAN POTASSIUM 25 MG PO TABS
25.0000 mg | ORAL_TABLET | Freq: Every day | ORAL | Status: DC
  Filled 2020-11-21: qty 1

## 2020-11-21 MED ORDER — APIXABAN 5 MG PO TABS
5.0000 mg | ORAL_TABLET | Freq: Two times a day (BID) | ORAL | Status: DC
  Administered 2020-11-22 – 2020-11-29 (×15): 5 mg via ORAL
  Filled 2020-11-21 (×15): qty 1

## 2020-11-21 MED ORDER — SPIRONOLACTONE 25 MG PO TABS
25.0000 mg | ORAL_TABLET | Freq: Every day | ORAL | Status: DC
  Filled 2020-11-21: qty 1

## 2020-11-21 MED ORDER — DOFETILIDE 500 MCG PO CAPS
500.0000 ug | ORAL_CAPSULE | Freq: Two times a day (BID) | ORAL | Status: DC
  Administered 2020-11-22 – 2020-11-23 (×3): 500 ug via ORAL
  Filled 2020-11-21 (×3): qty 1

## 2020-11-21 MED ORDER — TAMSULOSIN HCL 0.4 MG PO CAPS
0.4000 mg | ORAL_CAPSULE | Freq: Every day | ORAL | Status: DC
  Administered 2020-11-22 – 2020-11-29 (×8): 0.4 mg via ORAL
  Filled 2020-11-21 (×8): qty 1

## 2020-11-21 MED ORDER — ONDANSETRON HCL 4 MG/2ML IJ SOLN: 4.0000 mg | Freq: Four times a day (QID) | INTRAMUSCULAR | Status: DC | PRN

## 2020-11-21 MED ORDER — PANTOPRAZOLE SODIUM 40 MG PO TBEC
40.0000 mg | DELAYED_RELEASE_TABLET | Freq: Every day | ORAL | Status: DC
  Administered 2020-11-22 – 2020-11-29 (×8): 40 mg via ORAL
  Filled 2020-11-21 (×8): qty 1

## 2020-11-21 NOTE — Progress Notes (Signed)
BP (!) 92/35   Pulse 62   Temp 98 F (36.7 C)   Wt 169 lb (76.7 kg)   SpO2 93% Comment: on 3 liters  BMI 24.96 kg/m    Subjective:   Patient ID: Joshua [name], male    DOB: October 01, 1945, 75 y.o.   MRN: [id number]  HPI: Joshua [name] is a 75 y.o. male presenting on 11/21/2020 for Edema (Sob, low B/P)   HPI Patient is here today with increased shortness of breath and increased swelling in bilateral lower extremities.  He initially came to our office for an injection today but was noted to have the symptoms and then they took his blood pressure and was found to have a blood pressure of 92/35.  He denies any lightheadedness or dizziness today but is feeling slightly more short of breath even on his 3 L nasal cannula and having increased swelling.  He is taking furosemide for his congestive heart failure at 20 mg daily, it did look like in some notes that he had been increased on that but he did not get the increase and is only been taking 20 mg daily.  He is not taking his diuretic yet today because he had to come in here today.  He denies any chest pains or sweats or fevers or chills or cough.  Relevant past medical, surgical, family and social history reviewed and updated as indicated. Interim medical history since our last visit reviewed. Allergies and medications reviewed and updated.  Review of Systems  Constitutional:  Negative for chills and fever.  HENT:  Negative for congestion.   Eyes:  Negative for visual disturbance.  Respiratory:  Positive for shortness of breath. Negative for cough, chest tightness and wheezing.   Cardiovascular:  Positive for leg swelling. Negative for chest pain and palpitations.  Musculoskeletal:  Negative for back pain and gait problem.  Skin:  Negative for rash.  Neurological:  Negative for dizziness and light-headedness.  All other systems reviewed and are negative.  Per HPI unless specifically indicated above   Allergies as of 11/21/2020   No Known  Allergies      Medication List        Accurate as of November 21, 2020 12:32 PM. If you have any questions, ask your nurse or doctor.          apixaban 5 MG Tabs tablet Commonly known as: ELIQUIS Take 1 tablet (5 mg total) by mouth 2 (two) times daily.   cholecalciferol 1000 units tablet Commonly known as: VITAMIN D Take 1,000 Units by mouth daily.   cyanocobalamin 1000 MCG tablet Commonly known as: CVS VITAMIN B12 Take 1 tablet (1,000 mcg total) by mouth daily.   dofetilide 500 MCG capsule Commonly known as: Tikosyn Take 1 capsule (500 mcg total) by mouth 2 (two) times daily.   furosemide 40 MG tablet Commonly known as: LASIX TAKE 1 TABLET (40 MG TOTAL) BY MOUTH DAILY. What changed:  how much to take Another medication with the same name was removed. Continue taking this medication, and follow the directions you see here.   losartan 25 MG tablet Commonly known as: COZAAR Take 1 tablet (25 mg total) by mouth daily.   omeprazole 10 MG capsule Commonly known as: PRILOSEC TAKE ONE CAPSULE BY MOUTH ONE TIME DAILY   polyethylene glycol 17 g packet Commonly known as: MIRALAX / GLYCOLAX Take 17 g by mouth as needed for mild constipation.   potassium chloride SA 20 MEQ tablet Commonly  known as: KLOR-CON TAKE 1 TABLET (20 MEQ TOTAL) BY MOUTH DAILY.   rosuvastatin 10 MG tablet Commonly known as: CRESTOR Take 1 tablet (10 mg total) by mouth daily.   tamsulosin 0.4 MG Caps capsule Commonly known as: FLOMAX Take 1 capsule (0.4 mg total) by mouth daily.         Objective:   BP (!) 92/35   Pulse 62   Temp 98 F (36.7 C)   Wt 169 lb (76.7 kg)   SpO2 93% Comment: on 3 liters  BMI 24.96 kg/m   Wt Readings from Last 3 Encounters:  11/21/20 169 lb (76.7 kg)  11/07/20 169 lb 12.1 oz (77 kg)  10/13/20 170 lb (77.1 kg)    Physical Exam Vitals and nursing note reviewed.  Constitutional:      General: He is not in acute distress.    Appearance: He is  well-developed. He is not diaphoretic.  Eyes:     General: No scleral icterus.    Conjunctiva/sclera: Conjunctivae normal.  Neck:     Thyroid: No thyromegaly.  Cardiovascular:     Rate and Rhythm: Normal rate and regular rhythm.     Heart sounds: Normal heart sounds. No murmur heard. Pulmonary:     Effort: Pulmonary effort is normal. No respiratory distress.     Breath sounds: Rales present.  Chest:     Chest wall: No tenderness.  Musculoskeletal:        General: Swelling (3+ bilateral lower extremity) present. Normal range of motion.     Cervical back: Neck supple.  Lymphadenopathy:     Cervical: No cervical adenopathy.  Skin:    General: Skin is warm and dry.     Findings: No rash.  Neurological:     Mental Status: He is alert and oriented to person, place, and time.     Coordination: Coordination normal.  Psychiatric:        Behavior: Behavior normal.    CXR: increased fluid in lung bases, bilaterally  Assessment & Plan:   Problem List Items Addressed This Visit   None Visit Diagnoses     Shortness of breath    -  Primary   Relevant Orders   DG Chest 2 View   Peripheral edema       Relevant Orders   DG Chest 2 View       Hypotension with CHF exacerbation, do not know if I will be able to diurese outpatient so we will send to the emergency department for cardiology to admit and do inpatient CHF management.  Concern for blood pressure being low, no signs of infection but notable but may be related to worsening CHF  Spoke with cardiology's office and they said to send him the emergency department for admission.  Spoke with Dr. Davina Poke Follow up plan: Return if symptoms worsen or fail to improve.  Counseling provided for all of the vaccine components Orders Placed This Encounter  Procedures   DG Chest [place] Corleone Biegler, MD [facility] 11/21/2020, 12:32 PM

## 2020-11-21 NOTE — Telephone Encounter (Signed)
Dr. Warrick Parisian from [facility] thinking the patient is having congestive heart failure.

## 2020-11-21 NOTE — H&P (Signed)
Cardiology Admission History and Physical:   Patient ID: Joshua [name] MRN: [id number]; DOB: 1945/11/13   Admission date: 11/21/2020  PCP:  [name], [name], MD   [facility] Providers Cardiologist:  Shelva Majestic, MD  Electrophysiologist:  Thompson Grayer, MD      Chief Complaint:  "I just felt blah"  Patient Profile:   Joshua [name] is a 75 y.o. male with hx of HFpEF, CAD s/p prior PCI to LAD + RCA, moderate MR/MS, persistent atrial fibrillation/flutter, essential hypertension, COPD on home O2, who is being seen 11/21/2020 for the evaluation of acute on chronic heart failure with preserved ejection fraction.  History of Present Illness:   Joshua [name] extensive cardiac history is as noted above. He was most recently admitted for a lower GI bleed (July). This was believed to be 2/2 internal hemorrhoids. He was continued on his eliquis for CVA prevention.   This evening Joshua [name] tells me that he has been feeling overall more fatigued for the last week or so.  He notes a sensation of feeling "blah."  Over the last couple days he has had a dry cough.  He denies any associated fevers or chills.  Over the last couple days he is also noticed that his legs have been swelling quite a bit.  He has been taking his Lasix as instructed but his legs have continued to swell.  He feels a bit more short of breath although admittedly does not do much more at home other than "put my legs up and watch TV."  He denies any recent chest pain, chest pressure, or palpitations.   Past Medical History:  Diagnosis Date   Acute lower GI bleeding    related to gastritis / viral infection   Cataract    Cirrhosis ([place])    Congestive heart failure (CHF) ([place]) 11/21/2020   COPD (chronic obstructive pulmonary disease) ([place])    PFT 05-05-2010, FEV1 .9 (26%) ratio 60   Coronary artery disease    Gastritis 06-10-2006   Hiatal hernia 06-10-2006   EGD   HOH (hard of hearing)    Hx of adenomatous colonic polyps 2005,  2013   Colonoscopy   Hypercholesteremia    Hypertension    Hypertr obst cardiomyop    mild subaortic stenosis   Iron deficiency anemia, unspecified    LVH (left ventricular hypertrophy)    Other B-complex deficiencies    Persistent atrial fibrillation (HCC)    persistent   Rheumatic fever    Thrombocytopenia ([place]) 05/2008    Past Surgical History:  Procedure Laterality Date   ATRIAL FLUTTER ABLATION  06/2010   CTI ablation performed by [name]   CARDIAC CATHETERIZATION  05/28/2005   Widely patent coronaries and normal LV function.   CARDIAC CATHETERIZATION  08/11/2001   80% LAD stenosis successfully stented with a 3.5x55mm Cypher DES postdilated to 3.5mm resulting in reduction of 80% to 0%.   CARDIAC CATHETERIZATION  01/15/2001   Focal 95% proximal RCA stenosis, stented with a 3x54mm Zeta multilink stent with postdilatation utilizing a 3.5x58mm Quantum balloon with stenosis being reduced to 0%.   CARDIOVASCULAR STRESS TEST  05/29/2011   No scintigraphic evidence of inducible myocardial ischemia. No ECG changes. EKG negative for ischemia.   CARDIOVERSION N/A 10/20/2019   Procedure: CARDIOVERSION;  Surgeon: Sanda Klein, MD;  Location: [facility];  Service: Cardiovascular;  Laterality: N/A;   CARDIOVERSION N/A 01/14/2020   Procedure: CARDIOVERSION;  Surgeon: Buford Dresser, MD;  Location: [facility];  Service:  Cardiovascular;  Laterality: N/A;   CORONARY ANGIOPLASTY     2002-2003   SHOULDER SURGERY     TRANSTHORACIC ECHOCARDIOGRAM  05/29/2011   EF >70%, severe concentric LV hypertrophy, severe mitral annular calcification, mild-moderate aortic regurg,      Medications Prior to Admission: Prior to Admission medications   Medication Sig Start Date End Date Taking? Authorizing Provider  apixaban (ELIQUIS) 5 MG TABS tablet Take 1 tablet (5 mg total) by mouth 2 (two) times daily. 12/10/19   Allred, Jeneen Rinks, MD  cholecalciferol (VITAMIN D) 1000 units tablet Take 1,000 Units  by mouth daily.    [provider]  cyanocobalamin (CVS VITAMIN B12) 1000 MCG tablet Take 1 tablet (1,000 mcg total) by mouth daily. 11/24/14   Cherre Robins, PharmD  dofetilide (TIKOSYN) 500 MCG capsule Take 1 capsule (500 mcg total) by mouth 2 (two) times daily. 02/14/20   Shirley Friar, PA-C  furosemide (LASIX) 40 MG tablet TAKE 1 TABLET (40 MG TOTAL) BY MOUTH DAILY. Patient taking differently: Take 40 mg by mouth daily. 01/15/20 01/14/21  Shirley Friar, PA-C  losartan (COZAAR) 25 MG tablet Take 1 tablet (25 mg total) by mouth daily. 09/06/20   Barton Dubois, MD  omeprazole (PRILOSEC) 10 MG capsule TAKE ONE CAPSULE BY MOUTH ONE TIME DAILY Patient taking differently: Take 10 mg by mouth daily. 07/06/20   [name], [name], MD  polyethylene glycol (MIRALAX / GLYCOLAX) packet Take 17 g by mouth as needed for mild constipation.     [provider]  potassium chloride SA (KLOR-CON) 20 MEQ tablet TAKE 1 TABLET (20 MEQ TOTAL) BY MOUTH DAILY. Patient not taking: Reported on 11/21/2020 01/15/20 01/14/21  Shirley Friar, PA-C  rosuvastatin (CRESTOR) 10 MG tablet Take 1 tablet (10 mg total) by mouth daily. 05/03/16   Chipper Herb, MD  tamsulosin (FLOMAX) 0.4 MG CAPS capsule Take 1 capsule (0.4 mg total) by mouth daily. 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  Tobacco Use   Smoking status: Former    Packs/day: 1.00    Years: 10.00    Pack years: 10.00    Types: Cigarettes    Quit date: 02/26/1985    Years since quitting: 35.7   Smokeless tobacco: Never  Vaping Use   Vaping Use: Never used  Substance and Sexual Activity    Alcohol use: No   Drug use: No   Sexual activity: Not on file  Other Topics Concern   Not on file  Social History Narrative   Daughter lives with him   Social Determinants of Health   Financial Resource Strain: Low Risk    Difficulty of Paying Living Expenses: Not hard at all  Food Insecurity: No Food Insecurity   Worried About Charity fundraiser in the Last Year: Never true   Arboriculturist in the Last Year: Never true  Transportation Needs: No Transportation Needs   Lack of Transportation (Medical): No   Lack of Transportation (Non-Medical): No  Physical Activity: Sufficiently Active   Days of Exercise per Week: 7 days   Minutes  of Exercise per Session: 30 min  Stress: No Stress Concern Present   Feeling of Stress : Not at all  Social Connections: Moderately Integrated   Frequency of Communication with Friends and Family: More than three times a week   Frequency of Social Gatherings with Friends and Family: More than three times a week   Attends Religious Services: More than 4 times per year   Active Member of [employer] or Organizations: Yes   Attends Archivist Meetings: More than 4 times per year   Marital Status: Widowed  Human resources officer Violence: Not At Risk   Fear of Current or Ex-Partner: No   Emotionally Abused: No   Physically Abused: No   Sexually Abused: No    Family History:   The patient's family history includes Asthma in his brother; CAD in his father and paternal uncle; Cancer in his maternal grandfather; Colon cancer in his maternal grandfather; Emphysema in his brother; Heart attack (age of onset: 53) in his brother; Heart disease in his mother; Hyperlipidemia in his father; Hypertension in his sister; Lung cancer in his brother; Stroke in his mother and paternal uncle.    ROS:  Please see the history of present illness.  All other ROS reviewed and negative.     Physical Exam/Data:   Vitals:   11/21/20 1730 11/21/20 1800 11/21/20 1900 11/21/20  2030  BP: (!) 93/48 (!) 104/52 (!) 108/49 (!) 114/43  Pulse: 60 64 (!) 58 (!) 55  Resp: (!) 22 19 19 19   Temp:      TempSrc:      SpO2: 100% 98% 98% 98%  Weight:       No intake or output data in the 24 hours ending 11/21/20 2046 Last 3 Weights 11/21/2020 11/21/2020 11/07/2020  Weight (lbs) 170 lb 8 oz 169 lb 169 lb 12.1 oz  Weight (kg) 77.338 kg 76.658 kg 77 kg     Body mass index is 25.18 kg/m.  General:  Well nourished, well developed, in no acute distress HEENT: normal Neck: Neck veins nearly to the mandible at approx 45 degrees Vascular: No carotid bruits; Distal pulses 2+ bilaterally   Cardiac:  normal S1, S2; regular rate, slightly irregular rhythm, II/VI holosystolic murmur best heard at the apex Lungs:  crackles BL from mid lungs down, no wheezing, rhonchi or rales  Abd: soft, nontender, no hepatomegaly  Ext: 3+ edema from mid shin down BL Musculoskeletal:  No deformities, BUE and BLE strength normal and equal Skin: warm and dry  Neuro:  CNs 2-12 intact, no focal abnormalities noted Psych:  Normal affect   EKG:  The ECG that was done on 11/21/20 was personally reviewed and demonstrates rate controlled atrial flutter with variable AV conduction   Relevant CV Studies: TTE (09/2020):  1. Left ventricular ejection fraction, by estimation, is 65 to 70%. The  left ventricle has normal function. The left ventricle has no regional  wall motion abnormalities. There is moderate asymmetric left ventricular  hypertrophy of the basal-septal  segment (15 mm). No dynamic LVOT obstruction demonstrated on this exam.  Left ventricular diastolic parameters are indeterminate.   2. Right ventricular systolic function is mildly reduced. The right  ventricular size is mildly enlarged. There is severely elevated pulmonary  artery systolic pressure. The estimated right ventricular systolic  pressure is 81.1 mmHg.   3. Left atrial size was mild to moderately dilated.   4. The mitral valve is  degenerative. Moderate mitral valve regurgitation.  Moderate to  severe mitral stenosis. The mean mitral valve gradient is 7.0  mmHg with average heart rate of 55 bpm. Severe mitral annular  calcification.   5. Tricuspid valve regurgitation is moderate to severe.   6. The aortic valve is abnormal. There is mild calcification of the  aortic valve. Aortic valve regurgitation is severe. Mild aortic valve  stenosis. Aortic valve mean gradient measures 13.4 mmHg.   7. Pulmonic valve regurgitation is moderate.   8. The inferior vena cava is normal in size with <50% respiratory  variability, suggesting right atrial pressure of 8 mmHg.   Laboratory Data:  High Sensitivity Troponin:   Recent Labs  Lab 11/07/20 1051 11/07/20 1254  TROPONINIHS 48* 47*      Chemistry Recent Labs  Lab 11/21/20 1602  NA 139  K 4.3  CL 91*  CO2 43*  GLUCOSE 97  BUN 39*  CREATININE 1.05  CALCIUM 8.4*  GFRNONAA >60  ANIONGAP 5    Recent Labs  Lab 11/21/20 1602  PROT 6.3*  ALBUMIN 3.0*  AST 27  ALT 10  ALKPHOS 40  BILITOT 1.2   Lipids No results for input(s): CHOL, TRIG, HDL, LABVLDL, LDLCALC, CHOLHDL in the last 168 hours. Hematology Recent Labs  Lab 11/21/20 1602  WBC 6.8  RBC 4.07*  HGB 13.3  HCT 43.6  MCV 107.1*  MCH 32.7  MCHC 30.5  RDW 15.6*  PLT 126*   Thyroid No results for input(s): TSH, FREET4 in the last 168 hours. BNP Recent Labs  Lab 11/21/20 1602  BNP 391.0*    DDimer No results for input(s): DDIMER in the last 168 hours.   Radiology/Studies:  US Venous Img Lower Bilateral  Result Date: 11/21/2020 CLINICAL DATA:  Bilateral lower extremity edema EXAM: BILATERAL LOWER EXTREMITY VENOUS DOPPLER ULTRASOUND TECHNIQUE: Gray-scale sonography with compression, as well as color and duplex ultrasound, were performed to evaluate the deep venous system(s) from the level of the common femoral vein through the popliteal and proximal calf veins. COMPARISON:  None. FINDINGS: VENOUS  Normal compressibility of the common femoral, superficial femoral, and popliteal veins, as well as the visualized calf veins. Visualized portions of profunda femoral vein and great saphenous vein unremarkable. No filling defects to suggest DVT on grayscale or color Doppler imaging. Doppler waveforms show normal direction of venous flow, normal respiratory plasticity and response to augmentation. OTHER None. Limitations: none IMPRESSION: No lower extremity DVT. Electronically Signed   By: Miachel Roux M.D.   On: 11/21/2020 16:52   DG Chest Port 1 View  Result Date: 11/21/2020 CLINICAL DATA:  Edema, bilateral leg swelling EXAM: PORTABLE CHEST 1 VIEW COMPARISON:  11/21/2020, 12:14 p.m. FINDINGS: No significant change in chest radiographs. Cardiomegaly with diffuse bilateral interstitial pulmonary opacity and small bilateral pleural effusions. Pleural thickening and calcification about the lung bases. Osseous structures are unremarkable. IMPRESSION: No significant change in chest radiographs. Cardiomegaly with diffuse bilateral interstitial pulmonary opacity and small bilateral pleural effusions. Findings are most consistent with edema. Electronically Signed   By: Eddie Candle M.D.   On: 11/21/2020 16:59     Assessment and Plan:   #Acute on Chronic Diastolic HF: - 2/2 atrial arrhythmias + progressive mitral valvular disease. Clearly volume overloaded on exam with evidence of pulmonary edema on CXR - Mr. Pucciarelli preferred not to received lasix this evening. Will start IV 40 mg BID tomorrow AM - Start empa 10 mg qd  - Start aldactone 25 mg qd   #Persistent Atrial Fibrillation/Flutter: - Remains in rate  controlled flutter  - Cont tikosyn (Qtc slightly longer than prior but within range) - Cont eliquis  - Can consider DCCV although suspect likelihood of maintaining sinus rhythm very low   #Moderate MS/MR: - Appears to have progression of disease on echo over the last couple of years. Certainly  contributing to above - Consider TEE to better interrogate mitral valve and assess degree of MR  #COPD #Chronic Hypercarbic Respiratory Failure - Feel symptoms more related to pulmonary edema  - Atrovent nebs prn  - Cont home O2  #Essential Hypertension: - Well controlled - Cont losartan   Risk Assessment/Risk Scores:  New York Heart Association (NYHA) Functional Class NYHA Class III  CHA2DS2-VASc Score = 5  This indicates a 7.2% annual risk of stroke. The patient's score is based upon: CHF History: 1 HTN History: 1 Diabetes History: 0 Stroke History: 0 Vascular Disease History: 1 Age Score: 2 Gender Score: 0   Severity of Illness: The appropriate patient status for this patient is INPATIENT. Inpatient status is judged to be reasonable and necessary in order to provide the required intensity of service to ensure the patient's safety. The patient's presenting symptoms, physical exam findings, and initial radiographic and laboratory data in the context of their chronic comorbidities is felt to place them at high risk for further clinical deterioration. Furthermore, it is not anticipated that the patient will be medically stable for discharge from the hospital within 2 midnights of admission. The following factors support the patient status of inpatient.   " The patient's presenting symptoms include DOE, LE swelling. " The worrisome physical exam findings include LE edema. " The initial radiographic and laboratory data are worrisome because of radiographic evidence of pulmonary edema. " The chronic co-morbidities include COPD, CAD.  * I certify that at the point of admission it is my clinical judgment that the patient will require inpatient hospital care spanning beyond 2 midnights from the point of admission due to high intensity of service, high risk for further deterioration and high frequency of surveillance required.*   For questions or updates, please contact [place] Please consult [email or web address] for contact info under   Signed, Ronaldo Miyamoto, MD  11/21/2020 8:46 PM

## 2020-11-21 NOTE — ED Triage Notes (Signed)
Pt presents to ED with complaints of bilateral leg swelling since last night. Pt chronically wears 3L of O2. Pt denies SOB

## 2020-11-21 NOTE — ED Provider Notes (Signed)
[facility] EMERGENCY DEPARTMENT Provider Note   CSN: [id number] Arrival date & time: 11/21/20  1405     History Chief Complaint  Patient presents with   Leg Swelling    Joshua [name] is a 75 y.o. male.  HPI  Patient with significant medical history of COPD, diastolic CHF, preserved ejection fraction, hypertension, A. fib currently on Eliquis presents to the emergency department with chief complaint of worsening bilateral pedal edema.  Patient states this started today, he endorses worsening pain in his legs, but denies chest pain, shortness of breath, orthopnea.  He denies  recent travel, recent surgeries, currently on Eliquis has not missed any dosages.  He went to his primary care doctor today where they are concerned for acute CHF exacerbation. patient states he been taking his Lasix as prescribed, states he has been urinate without difficulty.  He has no other complaints.  Does not endorse fevers, chills, abdominal pain, urinary symptoms.  Past Medical History:  Diagnosis Date   Acute lower GI bleeding    related to gastritis / viral infection   Cataract    Cirrhosis ([place])    Congestive heart failure (CHF) ([place]) 11/21/2020   COPD (chronic obstructive pulmonary disease) ([place])    PFT 05-05-2010, FEV1 .9 (26%) ratio 60   Coronary artery disease    Gastritis 06-10-2006   Hiatal hernia 06-10-2006   EGD   HOH (hard of hearing)    Hx of adenomatous colonic polyps 2005, 2013   Colonoscopy   Hypercholesteremia    Hypertension    Hypertr obst cardiomyop    mild subaortic stenosis   Iron deficiency anemia, unspecified    LVH (left ventricular hypertrophy)    Other B-complex deficiencies    Persistent atrial fibrillation (HCC)    persistent   Rheumatic fever    Thrombocytopenia ([place]) 05/2008    Patient Active Problem List   Diagnosis Date Noted   Congestive heart failure (CHF) ([place]) 11/21/2020   Chronic diastolic heart failure ([place]) 10/13/2020   Chronic obstructive pulmonary  disease, unspecified ([place]) 09/14/2020   Splenic infarct    Rectal bleeding 09/05/2020   Adenomatous polyps 12/13/2016   Long term (current) use of anticoagulants [Z79.01] 06/14/2016   CAD in native artery 01/17/2016   HOCM (hypertrophic obstructive cardiomyopathy) ([place]) 07/16/2015   Hepatic cirrhosis ([place])    Syncope and collapse 01/14/2015   Erectile dysfunction 11/10/2014   Thrombocytopenia ([place]) 01/30/2014   Hyperlipidemia LDL goal <70 12/03/2013   High risk medication use 05/21/2013   BPH (benign prostatic hyperplasia) 01/29/2013   Family history of colon cancer 01/29/2013   PAF (paroxysmal atrial fibrillation) ([place]) 12/17/2012   Aortic valve insufficiency 12/17/2012   Mild mitral stenosis 12/17/2012   Hypertrophic obstructive cardiomyopathy ([place]) 03/31/2010   B12 deficiency 10/11/2008   Coronary atherosclerosis 10/11/2008   HIATAL HERNIA WITH REFLUX 10/11/2008   COLONIC POLYPS, ADENOMATOUS, HX OF 10/11/2008    Past Surgical History:  Procedure Laterality Date   ATRIAL FLUTTER ABLATION  06/2010   CTI ablation performed by [name]   CARDIAC CATHETERIZATION  05/28/2005   Widely patent coronaries and normal LV function.   CARDIAC CATHETERIZATION  08/11/2001   80% LAD stenosis successfully stented with a 3.5x86mm Cypher DES postdilated to 3.75mm resulting in reduction of 80% to 0%.   CARDIAC CATHETERIZATION  01/15/2001   Focal 95% proximal RCA stenosis, stented with a 3x4mm Zeta multilink stent with postdilatation utilizing a 3.5x3mm Quantum balloon with stenosis being reduced to 0%.  CARDIOVASCULAR STRESS TEST  05/29/2011   No scintigraphic evidence of inducible myocardial ischemia. No ECG changes. EKG negative for ischemia.   CARDIOVERSION N/A 10/20/2019   Procedure: CARDIOVERSION;  Surgeon: Sanda Klein, MD;  Location: [facility];  Service: Cardiovascular;  Laterality: N/A;   CARDIOVERSION N/A 01/14/2020   Procedure: CARDIOVERSION;  Surgeon: Buford Dresser, MD;   Location: [facility] ENDOSCOPY;  Service: Cardiovascular;  Laterality: N/A;   CORONARY ANGIOPLASTY     2002-2003   SHOULDER SURGERY     TRANSTHORACIC ECHOCARDIOGRAM  05/29/2011   EF >70%, severe concentric LV hypertrophy, severe mitral annular calcification, mild-moderate aortic regurg,        Family History  Problem Relation Age of Onset   Heart attack Brother 43       Ventircular rupture   Emphysema Brother    Lung cancer Brother    Heart disease Mother        Enlarged heart   Stroke Mother    Hyperlipidemia Father    CAD Father    Hypertension Sister    CAD Paternal Uncle    Stroke Paternal Uncle    Cancer Maternal Grandfather        colon   Colon cancer Maternal Grandfather    Asthma Brother     Social History   Tobacco Use   Smoking status: Former    Packs/day: 1.00    Years: 10.00    Pack years: 10.00    Types: Cigarettes    Quit date: 02/26/1985    Years since quitting: 35.7   Smokeless tobacco: Never  Vaping Use   Vaping Use: Never used  Substance Use Topics   Alcohol use: No   Drug use: No    Home Medications Prior to Admission medications   Medication Sig Start Date End Date Taking? Authorizing Provider  apixaban (ELIQUIS) 5 MG TABS tablet Take 1 tablet (5 mg total) by mouth 2 (two) times daily. 12/10/19   Allred, Jeneen Rinks, MD  cholecalciferol (VITAMIN D) 1000 units tablet Take 1,000 Units by mouth daily.    [provider]  cyanocobalamin ([employer] VITAMIN B12) 1000 MCG tablet Take 1 tablet (1,000 mcg total) by mouth daily. 11/24/14   Cherre Robins, PharmD  dofetilide (TIKOSYN) 500 MCG capsule Take 1 capsule (500 mcg total) by mouth 2 (two) times daily. 02/14/20   Shirley Friar, PA-C  furosemide (LASIX) 40 MG tablet TAKE 1 TABLET (40 MG TOTAL) BY MOUTH DAILY. Patient taking differently: Take 40 mg by mouth daily. 01/15/20 01/14/21  Shirley Friar, PA-C  losartan (COZAAR) 25 MG tablet Take 1 tablet (25 mg total) by mouth daily. 09/06/20    Barton Dubois, MD  omeprazole (PRILOSEC) 10 MG capsule TAKE ONE CAPSULE BY MOUTH ONE TIME DAILY Patient taking differently: Take 10 mg by mouth daily. 07/06/20   [name], [name], MD  polyethylene glycol (MIRALAX / GLYCOLAX) packet Take 17 g by mouth as needed for mild constipation.     [provider]  potassium chloride SA (KLOR-CON) 20 MEQ tablet TAKE 1 TABLET (20 MEQ TOTAL) BY MOUTH DAILY. Patient not taking: Reported on 11/21/2020 01/15/20 01/14/21  Shirley Friar, PA-C  rosuvastatin (CRESTOR) 10 MG tablet Take 1 tablet (10 mg total) by mouth daily. 05/03/16   Chipper Herb, MD  tamsulosin (FLOMAX) 0.4 MG CAPS capsule Take 1 capsule (0.4 mg total) by mouth daily. 06/13/20   [name], [name], MD    Allergies    Patient has no known allergies.  Review of Systems   Review of Systems  Constitutional:  Negative for chills and fever.  HENT:  Negative for congestion.   Respiratory:  Negative for shortness of breath.   Cardiovascular:  Positive for leg swelling. Negative for chest pain and palpitations.  Gastrointestinal:  Negative for abdominal pain, diarrhea, nausea and vomiting.  Genitourinary:  Negative for enuresis.  Musculoskeletal:  Negative for back pain.  Skin:  Negative for rash.  Neurological:  Negative for dizziness.  Hematological:  Does not bruise/bleed easily.   Physical Exam Updated Vital Signs BP (!) 104/52   Pulse 64   Temp 97.6 F (36.4 C) (Temporal)   Resp 19   Wt 77.3 kg   SpO2 98%   BMI 25.18 kg/m   Physical Exam Vitals and nursing note reviewed.  Constitutional:      General: He is not in acute distress.    Appearance: He is not ill-appearing.  HENT:     Head: Normocephalic and atraumatic.     Nose: No congestion.  Eyes:     Conjunctiva/sclera: Conjunctivae normal.  Cardiovascular:     Rate and Rhythm: Normal rate and regular rhythm.     Pulses: Normal pulses.     Heart sounds: No murmur heard.   No friction rub. No gallop.   Pulmonary:     Effort: No respiratory distress.     Breath sounds: Rales present. No wheezing or rhonchi.     Comments: Patient is noted bibasilar Rales in the lower lobes no wheezing or rhonchi present. Abdominal:     Palpations: Abdomen is soft.     Tenderness: There is no abdominal tenderness. There is no right CVA tenderness or left CVA tenderness.  Musculoskeletal:     Right lower leg: Edema present.     Left lower leg: Edema present.     Comments: Patient has noted 2+ pitting edema up to the mid thighs bilaterally, no noted lymphedema changes, neuro neurovascular fully intact.  Skin:    General: Skin is warm and dry.  Neurological:     Mental Status: He is alert.  Psychiatric:        Mood and Affect: Mood normal.    ED Results / Procedures / Treatments   Labs (all labs ordered are listed, but only abnormal results are displayed) Labs Reviewed  COMPREHENSIVE METABOLIC PANEL - Abnormal; Notable for the following components:      Result Value   Chloride 91 (*)    CO2 43 (*)    BUN 39 (*)    Calcium 8.4 (*)    Total Protein 6.3 (*)    Albumin 3.0 (*)    All other components within normal limits  BRAIN NATRIURETIC PEPTIDE - Abnormal; Notable for the following components:   B Natriuretic Peptide 391.0 (*)    All other components within normal limits  CBC WITH DIFFERENTIAL/PLATELET - Abnormal; Notable for the following components:   RBC 4.07 (*)    MCV 107.1 (*)    RDW 15.6 (*)    Platelets 126 (*)    All other components within normal limits  RESP PANEL BY RT-PCR (FLU A&B, COVID) [id number]    EKG None  Radiology US Venous Img Lower Bilateral  Result Date: 11/21/2020 CLINICAL DATA:  Bilateral lower extremity edema EXAM: BILATERAL LOWER EXTREMITY VENOUS DOPPLER ULTRASOUND TECHNIQUE: Gray-scale sonography with compression, as well as color and duplex ultrasound, were performed to evaluate the deep venous system(s) from the level of the common femoral vein through  the  popliteal and proximal calf veins. COMPARISON:  None. FINDINGS: VENOUS Normal compressibility of the common femoral, superficial femoral, and popliteal veins, as well as the visualized calf veins. Visualized portions of profunda femoral vein and great saphenous vein unremarkable. No filling defects to suggest DVT on grayscale or color Doppler imaging. Doppler waveforms show normal direction of venous flow, normal respiratory plasticity and response to augmentation. OTHER None. Limitations: none IMPRESSION: No lower extremity DVT. Electronically Signed   By: Miachel Roux M.D.   On: 11/21/2020 16:52   DG Chest Port 1 View  Result Date: 11/21/2020 CLINICAL DATA:  Edema, bilateral leg swelling EXAM: PORTABLE CHEST 1 VIEW COMPARISON:  11/21/2020, 12:14 p.m. FINDINGS: No significant change in chest radiographs. Cardiomegaly with diffuse bilateral interstitial pulmonary opacity and small bilateral pleural effusions. Pleural thickening and calcification about the lung bases. Osseous structures are unremarkable. IMPRESSION: No significant change in chest radiographs. Cardiomegaly with diffuse bilateral interstitial pulmonary opacity and small bilateral pleural effusions. Findings are most consistent with edema. Electronically Signed   By: Eddie Candle M.D.   On: 11/21/2020 16:59    Procedures Procedures   Medications Ordered in ED Medications - No data to display  ED Course  I have reviewed the triage vital signs and the nursing notes.  Pertinent labs & imaging results that were available during my care of the patient were reviewed by me and considered in my medical decision making (see chart for details).    MDM Rules/Calculators/A&P                          Initial impression-patient presents with bilateral edema.  He is alert, does not appear in distress, vital signs notable for a soft BP.  Concern for volume overloaded will obtain basic lab work-up, chest x-ray, add on DVT today for further  evaluation.  Work-up-CBC is unremarkable, CMP shows chloride of 91, CO2 43, BUN of 39, calcium 8.4, BN P of 391, DVT study is negative for acute findings.  Chest x-ray reveals cardiomegaly with diffuse bilateral interstitial pulmonary opacities with small bilateral pleural effusions.  Reassessment- findings consistent with volume overloaded but patient has persistently soft BP will defer on volume resuscitation and  diuretics will speak with cardiology for further recommendations.  Patient is reassessed BP has improved slightly 104/52, no respiratory distress, patient has no complaints, updated on recommendations from cardiology he is in agreement this plan.  Consult- Spoke with Dr. Warren Lacy cardiology he recommends admission and transfer down to [facility], he will admit the patient to his service.  He does not recommend fluid resuscitation and/or diuresis at this time.  Rule out-I have low suspicion for ACS as presentation is atypical, EKG sinus without signs of ischemia.  Low suspicion for PE denies pleuritic chest pain, shortness of breath, vital signs are reassuring, i.e. no tachypnea, hypoxia, patient is also on anticoagulant.  Low suspicion for dissection presentation atypical of etiology.   Low suspicion for DVT as DVT study is negative.  Plan-patient will be transferred down to [facility] admitted to the cardiology floor for acute on chronic CHF exacerbation.  Final Clinical Impression(s) / ED Diagnoses Final diagnoses:  Acute on chronic diastolic congestive heart failure (HCC)  Left leg swelling    Rx / DC Orders ED Discharge Orders     None        Aron Baba 11/21/20 Hulan Amato, MD 11/21/20 [id number]

## 2020-11-22 ENCOUNTER — Encounter (HOSPITAL_COMMUNITY): Payer: Self-pay | Admitting: Internal Medicine

## 2020-11-22 DIAGNOSIS — I5033 Acute on chronic diastolic (congestive) heart failure: Secondary | ICD-10-CM

## 2020-11-22 DIAGNOSIS — I422 Other hypertrophic cardiomyopathy: Secondary | ICD-10-CM

## 2020-11-22 DIAGNOSIS — D7589 Other specified diseases of blood and blood-forming organs: Secondary | ICD-10-CM

## 2020-11-22 DIAGNOSIS — I342 Nonrheumatic mitral (valve) stenosis: Secondary | ICD-10-CM | POA: Diagnosis not present

## 2020-11-22 DIAGNOSIS — I351 Nonrheumatic aortic (valve) insufficiency: Secondary | ICD-10-CM

## 2020-11-22 LAB — BASIC METABOLIC PANEL
Anion gap: 4 — ABNORMAL LOW (ref 5–15)
BUN: 34 mg/dL — ABNORMAL HIGH (ref 8–23)
CO2: 45 mmol/L — ABNORMAL HIGH (ref 22–32)
Calcium: 8.7 mg/dL — ABNORMAL LOW (ref 8.9–10.3)
Chloride: 90 mmol/L — ABNORMAL LOW (ref 98–111)
Creatinine, Ser: 1.15 mg/dL (ref 0.61–1.24)
GFR, Estimated: >60 mL/min (ref >60)
Glucose, Bld: 87 mg/dL (ref 70–99)
Potassium: 4.1 mmol/L (ref 3.5–5.1)
Sodium: 139 mmol/L (ref 135–145)

## 2020-11-22 LAB — LACTIC ACID, PLASMA: Lactic Acid, Venous: 1.3 mmol/L (ref 0.5–1.9)

## 2020-11-22 LAB — FOLATE: Folate: 13.4 ng/mL (ref >5.9)

## 2020-11-22 LAB — MAGNESIUM: Magnesium: 2.5 mg/dL — ABNORMAL HIGH (ref 1.7–2.4)

## 2020-11-22 MED ORDER — BENZONATATE 100 MG PO CAPS: 100.0000 mg | ORAL_CAPSULE | Freq: Three times a day (TID) | ORAL | Status: DC | PRN

## 2020-11-22 MED ORDER — ACETAZOLAMIDE 250 MG PO TABS
250.0000 mg | ORAL_TABLET | Freq: Once | ORAL | Status: AC
  Administered 2020-11-22: 250 mg via ORAL
  Filled 2020-11-22: qty 1

## 2020-11-22 MED ORDER — IPRATROPIUM BROMIDE 0.02 % IN SOLN
0.5000 mg | RESPIRATORY_TRACT | Status: DC | PRN
  Administered 2020-11-29: 0.5 mg via RESPIRATORY_TRACT
  Filled 2020-11-22: qty 2.5

## 2020-11-22 MED ORDER — FUROSEMIDE 10 MG/ML IJ SOLN
40.0000 mg | Freq: Two times a day (BID) | INTRAMUSCULAR | Status: DC
  Administered 2020-11-22: 40 mg via INTRAVENOUS
  Filled 2020-11-22: qty 4

## 2020-11-22 NOTE — Progress Notes (Addendum)
Progress Note  Patient Name: MARINA DESIRE Date of Encounter: 11/22/2020  [facility] Cardiologist: Shelva Majestic, MD   Subjective   Sitting up in the chair comfortably, no distress noted.   Inpatient Medications    Scheduled Meds:  apixaban  5 mg Oral BID   dofetilide  500 mcg Oral BID   empagliflozin  10 mg Oral Daily   furosemide  40 mg Intravenous BID   losartan  25 mg Oral Daily   pantoprazole  40 mg Oral Daily   rosuvastatin  10 mg Oral Daily   sodium chloride flush  3 mL Intravenous Q12H   spironolactone  25 mg Oral Daily   tamsulosin  0.4 mg Oral Daily   Continuous Infusions:  sodium chloride     PRN Meds: sodium chloride, acetaminophen, benzonatate, ipratropium, polyethylene glycol, sodium chloride flush   Vital Signs    Vitals:   11/22/20 0449 11/22/20 0731 11/22/20 0733 11/22/20 1024  BP: (!) 93/46 (!) 97/36 (!) 104/51 (!) 95/45  Pulse: (!) 56  (!) 57 62  Resp: 20     Temp: (!) 97.5 F (36.4 C)  98 F (36.7 C)   TempSrc: Oral  Oral   SpO2: 97%  98%   Weight:      Height:        Intake/Output Summary (Last 24 hours) at 11/22/2020 1055 Last data filed at 11/22/2020 1039 Gross per 24 hour  Intake 246 ml  Output --  Net 246 ml   Last 3 Weights 11/22/2020 11/21/2020 11/21/2020  Weight (lbs) 166 lb 166 lb 14.4 oz 170 lb 8 oz  Weight (kg) 75.297 kg 75.705 kg 77.338 kg      Telemetry    Atrial flutter with intermittent sinus beats - Personally Reviewed  ECG    Atrial flutter, 51 bpm, QT 490 - Personally Reviewed  Physical Exam   GEN: No acute distress.   Neck: No JVD Cardiac: Irreg Irreg, 3/6 systolic murmur LLSB, no rubs, or gallops.  Respiratory: Diminished with crackles in bases GI: Soft, nontender, non-distended  MS: 1-2+ bilateral LE edema; No deformity. Neuro:  Nonfocal  Psych: Normal affect   Labs    High Sensitivity Troponin:   Recent Labs  Lab 11/07/20 1051 11/07/20 1254  TROPONINIHS 48* 47*     Chemistry Recent Labs   Lab 11/21/20 1602 11/22/20 0516  NA 139 139  K 4.3 4.1  CL 91* 90*  CO2 43* 45*  GLUCOSE 97 87  BUN 39* 34*  CREATININE 1.05 1.15  CALCIUM 8.4* 8.7*  MG  --  2.5*  PROT 6.3*  --   ALBUMIN 3.0*  --   AST 27  --   ALT 10  --   ALKPHOS 40  --   BILITOT 1.2  --   GFRNONAA >60 >60  ANIONGAP 5 4*    Lipids No results for input(s): CHOL, TRIG, HDL, LABVLDL, LDLCALC, CHOLHDL in the last 168 hours.  Hematology Recent Labs  Lab 11/21/20 1602  WBC 6.8  RBC 4.07*  HGB 13.3  HCT 43.6  MCV 107.1*  MCH 32.7  MCHC 30.5  RDW 15.6*  PLT 126*   Thyroid No results for input(s): TSH, FREET4 in the last 168 hours.  BNP Recent Labs  Lab 11/21/20 1602  BNP 391.0*    DDimer No results for input(s): DDIMER in the last 168 hours.   Radiology    DG Chest 2 View  Result Date: 11/22/2020 CLINICAL DATA:  Shortness of breath, peripheral edema, history of CHF, COPD, cirrhosis, coronary artery disease, hypertension EXAM: CHEST - 2 VIEW COMPARISON:  11/07/2020 FINDINGS: Enlargement of cardiac silhouette with pulmonary vascular congestion. Atherosclerotic calcification aorta. Chronic bibasilar pleural thickening, RIGHT pleural calcification, blunting of the costophrenic angles, and bibasilar atelectasis. Slight chronic accentuation of interstitial markings stable. No pneumothorax. Mitral annular calcification and coronary arterial stent noted. 4.8 x 3.2 cm diameter opacity projects over LEFT heart on PA view, not localized on lateral view, may represent loculated fluid or consolidation, mass unlikely but not excluded. Bones unremarkable. IMPRESSION: Enlargement of cardiac silhouette. Chronic bibasilar scarring and atelectasis as well as chronic interstitial disease. Questionable 4.8 x 3.2 cm opacity projecting over the LEFT heart, potentially loculated fluid or consolidation, mass not excluded; either CT assessment or radiographic follow-up until resolution recommended to exclude tumor. Aortic  Atherosclerosis (ICD10-I70.0). Electronically Signed   By: Lavonia Dana M.D.   On: 11/22/2020 08:23   US Venous Img Lower Bilateral  Result Date: 11/21/2020 CLINICAL DATA:  Bilateral lower extremity edema EXAM: BILATERAL LOWER EXTREMITY VENOUS DOPPLER ULTRASOUND TECHNIQUE: Gray-scale sonography with compression, as well as color and duplex ultrasound, were performed to evaluate the deep venous system(s) from the level of the common femoral vein through the popliteal and proximal calf veins. COMPARISON:  None. FINDINGS: VENOUS Normal compressibility of the common femoral, superficial femoral, and popliteal veins, as well as the visualized calf veins. Visualized portions of profunda femoral vein and great saphenous vein unremarkable. No filling defects to suggest DVT on grayscale or color Doppler imaging. Doppler waveforms show normal direction of venous flow, normal respiratory plasticity and response to augmentation. OTHER None. Limitations: none IMPRESSION: No lower extremity DVT. Electronically Signed   By: Miachel Roux M.D.   On: 11/21/2020 16:52   DG Chest Port 1 View  Result Date: 11/21/2020 CLINICAL DATA:  Edema, bilateral leg swelling EXAM: PORTABLE CHEST 1 VIEW COMPARISON:  11/21/2020, 12:14 p.m. FINDINGS: No significant change in chest radiographs. Cardiomegaly with diffuse bilateral interstitial pulmonary opacity and small bilateral pleural effusions. Pleural thickening and calcification about the lung bases. Osseous structures are unremarkable. IMPRESSION: No significant change in chest radiographs. Cardiomegaly with diffuse bilateral interstitial pulmonary opacity and small bilateral pleural effusions. Findings are most consistent with edema. Electronically Signed   By: Eddie Candle M.D.   On: 11/21/2020 16:59    Cardiac Studies   Echo: 10/05/20  IMPRESSIONS     1. Left ventricular ejection fraction, by estimation, is 65 to 70%. The  left ventricle has normal function. The left ventricle  has no regional  wall motion abnormalities. There is moderate asymmetric left ventricular  hypertrophy of the basal-septal  segment (15 mm). No dynamic LVOT obstruction demonstrated on this exam.  Left ventricular diastolic parameters are indeterminate.   2. Right ventricular systolic function is mildly reduced. The right  ventricular size is mildly enlarged. There is severely elevated pulmonary  artery systolic pressure. The estimated right ventricular systolic  pressure is 41.9 mmHg.   3. Left atrial size was mild to moderately dilated.   4. The mitral valve is degenerative. Moderate mitral valve regurgitation.  Moderate to severe mitral stenosis. The mean mitral valve gradient is 7.0  mmHg with average heart rate of 55 bpm. Severe mitral annular  calcification.   5. Tricuspid valve regurgitation is moderate to severe.   6. The aortic valve is abnormal. There is mild calcification of the  aortic valve. Aortic valve regurgitation is severe. Mild aortic valve  stenosis. Aortic valve mean gradient measures 13.4 mmHg.   7. Pulmonic valve regurgitation is moderate.   8. The inferior vena cava is normal in size with <50% respiratory  variability, suggesting right atrial pressure of 8 mmHg.   FINDINGS   Left Ventricle: Left ventricular ejection fraction, by estimation, is 65  to 70%. The left ventricle has normal function. The left ventricle has no  regional wall motion abnormalities. Definity contrast agent was given IV  to delineate the left ventricular   endocardial borders. The left ventricular internal cavity size was normal  in size. There is moderate asymmetric left ventricular hypertrophy of the  basal-septal segment. Left ventricular diastolic parameters are  indeterminate.   Right Ventricle: The right ventricular size is mildly enlarged. No  increase in right ventricular wall thickness. Right ventricular systolic  function is mildly reduced. There is severely elevated pulmonary  artery  systolic pressure. The tricuspid  regurgitant velocity is 3.78 m/s, and with an assumed right atrial  pressure of 8 mmHg, the estimated right ventricular systolic pressure is  00.1 mmHg.   Left Atrium: Left atrial size was mild to moderately dilated.   Right Atrium: Right atrial size was normal in size.   Pericardium: There is no evidence of pericardial effusion.   Mitral Valve: The mitral valve is degenerative in appearance. Severe  mitral annular calcification. Moderate mitral valve regurgitation.  Moderate to severe mitral valve stenosis. MV peak gradient, 18.0 mmHg. The  mean mitral valve gradient is 7.0 mmHg with   average heart rate of 55 bpm.   Tricuspid Valve: The tricuspid valve is normal in structure. Tricuspid  valve regurgitation is moderate to severe. No evidence of tricuspid  stenosis.   Aortic Valve: The aortic valve is abnormal. There is mild calcification of  the aortic valve. Aortic valve regurgitation is severe. Aortic  regurgitation PHT measures 381 msec. Mild aortic stenosis is present.  Aortic valve mean gradient measures 13.4 mmHg.   Aortic valve peak gradient measures 23.8 mmHg. Aortic valve area, by VTI  measures 1.65 cm.   Pulmonic Valve: The pulmonic valve was grossly normal. Pulmonic valve  regurgitation is moderate. No evidence of pulmonic stenosis.   Aorta: Ascending aorta likely upper limit of normal for age when indexed  to BSA. The aortic root is normal in size and structure.   Venous: The inferior vena cava is normal in size with less than 50%  respiratory variability, suggesting right atrial pressure of 8 mmHg.   IAS/Shunts: No atrial level shunt detected by color flow Doppler.   Patient Profile     75 y.o. male with hx of HFpEF, CAD s/p prior PCI to LAD + RCA, moderate MR/MS, persistent atrial fibrillation/flutter, essential hypertension, COPD on home O2, who is being seen 11/21/2020 for the evaluation of acute on chronic heart  failure with preserved ejection fraction.  Assessment & Plan    Acute on Chronic Diastolic HF: in the setting of atrial flutter with valvular disease. BNP 391, CXR small bilateral effusions , 4.8 x 3.2 cm opacity over the left heart with recommendations for follow up CXR. Extremities are warm and dry.  -- started IV lasix 40mg  BID this morning as he did not wish to have lasix last evening per note. Follow up I&Os, daily weights -- Bps are soft, will hold losartan/spiro for now. Will check a lactic acid  -- started on jardiance on admission but will hold given low BP. Plan to resume at discharge   Moderate  to Severe MR: Echo 8/22 with mean mitral valve gradient is 7.0 mmHg. Progressive disease over the past couple of years. Will review with MD regarding work up with TEE, suspect this is contributing to his CHF exacerbation.   Persistent Atrial fib/flutter: rates are controlled in the 50-60 range. Intermittent episodes of sinus, mostly flutter. With intermittent episodes of SR not a candidate for DCCV. Started on Tikosyn 12/2019, last seen in the Afib clinic 3/22 for follow up with plans for DCCV but noted in SR. -- QT of 490 on EKG this morning, improved from admission -- continue Tikosyn for now, along with Eliquis.   CAD: s/p stenting to LAD and RCA '02 &'03. No chest pain PTA. hsTn 48>>47 in the setting of CHF with valvular disease  HLD: on crestor 10mg  daily  Chronic respiratory failure/COPD: uses 3L [place] all the time at home. Has not had to increase PTA.   HTN: blood pressures soft today. -- hold losartan and spiro to allow for diuresis   For questions or updates, please contact [place] Please consult [email or web address] for contact info under        Signed, Reino Bellis, NP  11/22/2020, 10:55 AM     Patient seen and examined. Agree with assessment and plan.  [name] is well-known to me and I have been caring for him for over 25 years.  He has a history of documented  hypertrophic obstructive cardiomyopathy and is status post stenting of his proximal RCA and LAD and has a distal PDA occlusion occlusion with left-to-right collaterals.  His last cardiac catheterization in 2007 revealed both stents to be widely patent.  Remotely, he has been on Norvasc therapy with his atrial flutter and HOCM.  On prior echoes he has been demonstrated to have severe asymmetric left ventricular hypertrophy with mild systolic anterior motion of the mitral valve and severe mitral annular calcification with mild MS as well as mild aortic stenosis and aortic insufficiency.  I have not seen him in the office since August 2021.  Most recently, he has had issues with progressive lower extremity edema, atrial fibrillation/flutter apparently has been on Tikosyn since November 2021.  He has been anticoagulated.  He has COPD and uses 3 L of oxygen nasal cannula at home.  Had undergone an echo Doppler study in August 2022 scheduled by Barrington Ellison, PA-C which revealed hyperdynamic LV function with EF 65 to 70%, and moderate asymmetric LVH of the basal septal segment without dynamic LVOT obstruction on that exam.  I am not sure if there was any attempted provocation.  He had severe pulmonary artery systolic pressure elevation at 65.2 mmHg and there was evidence for moderately severe mitral stenosis as well as moderate mitral valve regurgitation.  There also was mild aortic valve stenosis with severe aortic regurgitation.  Over the past several days he had noticed progressive leg swelling left greater than right.  He also has noticed increased fatigability leading to presentation yesterday.  His chest x-ray on admission shows wall bilateral pleural effusions.  He has been treated with IV Lasix.  Presently he is not having chest pain.  Rhythm reveals atrial flutter with 4-1 block.  Laboratories notable for significant contraction alkalosis with CO2 at 45, significant macrocytosis with MCV at 107 and  thrombocytopenia with platelet count is 126.  BN P was mildly increased at 349.  I have recommended patient undergo transesophageal echocardiography and we will try to schedule this as soon as schedule allows this week.  We  will check B12 and folate levels, consider possible acetazolamide with CO2 45.  We will hold IV furosemide presently and continue to hold losartan and spironolactone with somewhat low blood pressure.  Troy Sine, MD, [facility] 11/22/2020 1:41 PM

## 2020-11-22 NOTE — Progress Notes (Signed)
Mobility Specialist Progress Note:    11/22/20 1445  Mobility  Activity Ambulated in hall  Range of Motion/Exercises Active;All extremities  Level of Assistance Standby assist, set-up cues, supervision of patient - no hands on  Assistive Device None  Minutes Ambulated 5 minutes  Distance Ambulated (ft) 520 ft  Mobility Ambulated with assistance in hallway  Mobility Response Tolerated well  Mobility performed by Mobility specialist  Bed Position Chair  Transport method Ambulatory  $Mobility charge 1 Mobility   Pt was received in chair and agreed to mobility. Ambulated 520' on 3LO2 and with supervision. Pt stated he felt "wobbly", otherwise asx. Tolerated well. Pt left in chair with all needs met.   Nelta Numbers Mobility Specialist  Phone [phone]

## 2020-11-22 NOTE — Progress Notes (Signed)
Called pharmacy regarding availability of scheduled medications. Per recipient of call, medication reconciliation was ongoing. Patient has not received scheduled medication as reflected on MAR as of time of writing.

## 2020-11-22 NOTE — Progress Notes (Signed)
    Placed on Endo schedule for Friday 1pm for TEE, first available slot. Will follow to move up if slot becomes available.

## 2020-11-23 DIAGNOSIS — I351 Nonrheumatic aortic (valve) insufficiency: Secondary | ICD-10-CM | POA: Diagnosis not present

## 2020-11-23 DIAGNOSIS — I5033 Acute on chronic diastolic (congestive) heart failure: Secondary | ICD-10-CM | POA: Diagnosis not present

## 2020-11-23 DIAGNOSIS — D7589 Other specified diseases of blood and blood-forming organs: Secondary | ICD-10-CM | POA: Diagnosis not present

## 2020-11-23 DIAGNOSIS — I342 Nonrheumatic mitral (valve) stenosis: Secondary | ICD-10-CM | POA: Diagnosis not present

## 2020-11-23 LAB — BASIC METABOLIC PANEL
Anion gap: 4 — ABNORMAL LOW (ref 5–15)
BUN: 31 mg/dL — ABNORMAL HIGH (ref 8–23)
CO2: 42 mmol/L — ABNORMAL HIGH (ref 22–32)
Calcium: 8.3 mg/dL — ABNORMAL LOW (ref 8.9–10.3)
Chloride: 89 mmol/L — ABNORMAL LOW (ref 98–111)
Creatinine, Ser: 1.07 mg/dL (ref 0.61–1.24)
GFR, Estimated: >60 mL/min (ref >60)
Glucose, Bld: 73 mg/dL (ref 70–99)
Potassium: 4.6 mmol/L (ref 3.5–5.1)
Sodium: 135 mmol/L (ref 135–145)

## 2020-11-23 LAB — MAGNESIUM: Magnesium: 2.4 mg/dL (ref 1.7–2.4)

## 2020-11-23 LAB — VITAMIN B12: Vitamin B-12: 632 pg/mL (ref 180–914)

## 2020-11-23 MED ORDER — INFLUENZA VAC A&B SA ADJ QUAD 0.5 ML IM PRSY
0.5000 mL | PREFILLED_SYRINGE | INTRAMUSCULAR | Status: AC
  Administered 2020-11-26: 0.5 mL via INTRAMUSCULAR
  Filled 2020-11-23: qty 0.5

## 2020-11-23 MED ORDER — ACETAZOLAMIDE 250 MG PO TABS
250.0000 mg | ORAL_TABLET | Freq: Once | ORAL | Status: AC
  Administered 2020-11-23: 250 mg via ORAL
  Filled 2020-11-23: qty 1

## 2020-11-23 MED ORDER — DOFETILIDE 250 MCG PO CAPS
250.0000 ug | ORAL_CAPSULE | Freq: Two times a day (BID) | ORAL | Status: DC
  Administered 2020-11-23 – 2020-11-29 (×12): 250 ug via ORAL
  Filled 2020-11-23 (×12): qty 1

## 2020-11-23 NOTE — Plan of Care (Signed)

## 2020-11-23 NOTE — Progress Notes (Signed)
Mobility Specialist Progress Note:   11/23/20 1010  Mobility  Activity Ambulated in hall  Range of Motion/Exercises Active;All extremities  Level of Assistance Contact guard assist, steadying assist  Assistive Device None  Minutes Ambulated 5 minutes  Distance Ambulated (ft) 520 ft  Mobility Ambulated with assistance in hallway  Mobility Response Tolerated well  Mobility performed by Mobility specialist  Bed Position Chair  Transport method Ambulatory  $Mobility charge 1 Mobility   Pt received in bed willing to participate in mobility. Ambulated in hallway at contact guard. Pt was more unsteady than yesterday but did not want to use RW. Pt returned to chair with call bell in reach and all needs met.     Mobility Specialist Phone [phone]  

## 2020-11-23 NOTE — Progress Notes (Addendum)
Progress Note  Patient Name: Joshua [name] Date of Encounter: 11/23/2020  [facility] Cardiologist: Shelva Majestic, MD   Subjective   Says he does feel his breathing is better. Was not using the urinal consistently yesterday.   Inpatient Medications    Scheduled Meds:  acetaZOLAMIDE  250 mg Oral Once   apixaban  5 mg Oral BID   dofetilide  250 mcg Oral BID   pantoprazole  40 mg Oral Daily   rosuvastatin  10 mg Oral Daily   sodium chloride flush  3 mL Intravenous Q12H   tamsulosin  0.4 mg Oral Daily   Continuous Infusions:  sodium chloride     PRN Meds: sodium chloride, acetaminophen, benzonatate, ipratropium, polyethylene glycol, sodium chloride flush   Vital Signs    Vitals:   11/22/20 1505 11/22/20 2006 11/23/20 0431 11/23/20 0733  BP: (!) 112/48 (!) 105/56 (!) 93/48 (!) 118/38  Pulse: 60 (!) 59 62 (!) 58  Resp: 18 18 18    Temp: 97.7 F (36.5 C) (!) 97.3 F (36.3 C) (!) 97.2 F (36.2 C) (!) 97.4 F (36.3 C)  TempSrc: Oral Oral Oral Oral  SpO2: 97% 98% 98% 100%  Weight:   76.9 kg   Height:        Intake/Output Summary (Last 24 hours) at 11/23/2020 1058 Last data filed at 11/23/2020 0859 Gross per 24 hour  Intake 1280 ml  Output 600 ml  Net 680 ml   Last 3 Weights 11/23/2020 11/22/2020 11/21/2020  Weight (lbs) 169 lb 8 oz 166 lb 166 lb 14.4 oz  Weight (kg) 76.885 kg 75.297 kg 75.705 kg      Telemetry    Atrial flutter rates 50-60, did have 2 episodes with HR in the 30s - Personally Reviewed  ECG    No new tracing this morning  Physical Exam   GEN: No acute distress.   Neck: + JVD Cardiac: Irreg, 3/6 systolic murmur at LLSB, no rubs, or gallops.  Respiratory: Clear to auscultation bilaterally. GI: Soft, nontender, non-distended  MS: 1-2+ LE bilateral edema; No deformity. Neuro:  Nonfocal  Psych: Normal affect   Labs    High Sensitivity Troponin:   Recent Labs  Lab 11/07/20 1051 11/07/20 1254  TROPONINIHS 48* 47*     Chemistry Recent  Labs  Lab 11/21/20 1602 11/22/20 0516 11/23/20 0305  NA 139 139 135  K 4.3 4.1 4.6  CL 91* 90* 89*  CO2 43* 45* 42*  GLUCOSE 97 87 73  BUN 39* 34* 31*  CREATININE 1.05 1.15 1.07  CALCIUM 8.4* 8.7* 8.3*  MG  --  2.5* 2.4  PROT 6.3*  --   --   ALBUMIN 3.0*  --   --   AST 27  --   --   ALT 10  --   --   ALKPHOS 40  --   --   BILITOT 1.2  --   --   GFRNONAA >60 >60 >60  ANIONGAP 5 4* 4*    Lipids No results for input(s): CHOL, TRIG, HDL, LABVLDL, LDLCALC, CHOLHDL in the last 168 hours.  Hematology Recent Labs  Lab 11/21/20 1602  WBC 6.8  RBC 4.07*  HGB 13.3  HCT 43.6  MCV 107.1*  MCH 32.7  MCHC 30.5  RDW 15.6*  PLT 126*   Thyroid No results for input(s): TSH, FREET4 in the last 168 hours.  BNP Recent Labs  Lab 11/21/20 1602  BNP 391.0*    DDimer No results  for input(s): DDIMER in the last 168 hours.   Radiology    DG Chest 2 View  Result Date: 11/22/2020 CLINICAL DATA:  Shortness of breath, peripheral edema, history of CHF, COPD, cirrhosis, coronary artery disease, hypertension EXAM: CHEST - 2 VIEW COMPARISON:  11/07/2020 FINDINGS: Enlargement of cardiac silhouette with pulmonary vascular congestion. Atherosclerotic calcification aorta. Chronic bibasilar pleural thickening, RIGHT pleural calcification, blunting of the costophrenic angles, and bibasilar atelectasis. Slight chronic accentuation of interstitial markings stable. No pneumothorax. Mitral annular calcification and coronary arterial stent noted. 4.8 x 3.2 cm diameter opacity projects over LEFT heart on PA view, not localized on lateral view, may represent loculated fluid or consolidation, mass unlikely but not excluded. Bones unremarkable. IMPRESSION: Enlargement of cardiac silhouette. Chronic bibasilar scarring and atelectasis as well as chronic interstitial disease. Questionable 4.8 x 3.2 cm opacity projecting over the LEFT heart, potentially loculated fluid or consolidation, mass not excluded; either CT  assessment or radiographic follow-up until resolution recommended to exclude tumor. Aortic Atherosclerosis (ICD10-I70.0). Electronically Signed   By: Lavonia Dana M.D.   On: 11/22/2020 08:23   US Venous Img Lower Bilateral  Result Date: 11/21/2020 CLINICAL DATA:  Bilateral lower extremity edema EXAM: BILATERAL LOWER EXTREMITY VENOUS DOPPLER ULTRASOUND TECHNIQUE: Gray-scale sonography with compression, as well as color and duplex ultrasound, were performed to evaluate the deep venous system(s) from the level of the common femoral vein through the popliteal and proximal calf veins. COMPARISON:  None. FINDINGS: VENOUS Normal compressibility of the common femoral, superficial femoral, and popliteal veins, as well as the visualized calf veins. Visualized portions of profunda femoral vein and great saphenous vein unremarkable. No filling defects to suggest DVT on grayscale or color Doppler imaging. Doppler waveforms show normal direction of venous flow, normal respiratory plasticity and response to augmentation. OTHER None. Limitations: none IMPRESSION: No lower extremity DVT. Electronically Signed   By: Miachel Roux M.D.   On: 11/21/2020 16:52   DG Chest Port 1 View  Result Date: 11/21/2020 CLINICAL DATA:  Edema, bilateral leg swelling EXAM: PORTABLE CHEST 1 VIEW COMPARISON:  11/21/2020, 12:14 p.m. FINDINGS: No significant change in chest radiographs. Cardiomegaly with diffuse bilateral interstitial pulmonary opacity and small bilateral pleural effusions. Pleural thickening and calcification about the lung bases. Osseous structures are unremarkable. IMPRESSION: No significant change in chest radiographs. Cardiomegaly with diffuse bilateral interstitial pulmonary opacity and small bilateral pleural effusions. Findings are most consistent with edema. Electronically Signed   By: Eddie Candle M.D.   On: 11/21/2020 16:59    Cardiac Studies   Echo: 10/05/20   IMPRESSIONS     1. Left ventricular ejection  fraction, by estimation, is 65 to 70%. The  left ventricle has normal function. The left ventricle has no regional  wall motion abnormalities. There is moderate asymmetric left ventricular  hypertrophy of the basal-septal  segment (15 mm). No dynamic LVOT obstruction demonstrated on this exam.  Left ventricular diastolic parameters are indeterminate.   2. Right ventricular systolic function is mildly reduced. The right  ventricular size is mildly enlarged. There is severely elevated pulmonary  artery systolic pressure. The estimated right ventricular systolic  pressure is 81.1 mmHg.   3. Left atrial size was mild to moderately dilated.   4. The mitral valve is degenerative. Moderate mitral valve regurgitation.  Moderate to severe mitral stenosis. The mean mitral valve gradient is 7.0  mmHg with average heart rate of 55 bpm. Severe mitral annular  calcification.   5. Tricuspid valve regurgitation is moderate to  severe.   6. The aortic valve is abnormal. There is mild calcification of the  aortic valve. Aortic valve regurgitation is severe. Mild aortic valve  stenosis. Aortic valve mean gradient measures 13.4 mmHg.   7. Pulmonic valve regurgitation is moderate.   8. The inferior vena cava is normal in size with <50% respiratory  variability, suggesting right atrial pressure of 8 mmHg.   FINDINGS   Left Ventricle: Left ventricular ejection fraction, by estimation, is 65  to 70%. The left ventricle has normal function. The left ventricle has no  regional wall motion abnormalities. Definity contrast agent was given IV  to delineate the left ventricular   endocardial borders. The left ventricular internal cavity size was normal  in size. There is moderate asymmetric left ventricular hypertrophy of the  basal-septal segment. Left ventricular diastolic parameters are  indeterminate.   Right Ventricle: The right ventricular size is mildly enlarged. No  increase in right ventricular wall  thickness. Right ventricular systolic  function is mildly reduced. There is severely elevated pulmonary artery  systolic pressure. The tricuspid  regurgitant velocity is 3.78 m/s, and with an assumed right atrial  pressure of 8 mmHg, the estimated right ventricular systolic pressure is  52.7 mmHg.   Left Atrium: Left atrial size was mild to moderately dilated.   Right Atrium: Right atrial size was normal in size.   Pericardium: There is no evidence of pericardial effusion.   Mitral Valve: The mitral valve is degenerative in appearance. Severe  mitral annular calcification. Moderate mitral valve regurgitation.  Moderate to severe mitral valve stenosis. MV peak gradient, 18.0 mmHg. The  mean mitral valve gradient is 7.0 mmHg with   average heart rate of 55 bpm.   Tricuspid Valve: The tricuspid valve is normal in structure. Tricuspid  valve regurgitation is moderate to severe. No evidence of tricuspid  stenosis.   Aortic Valve: The aortic valve is abnormal. There is mild calcification of  the aortic valve. Aortic valve regurgitation is severe. Aortic  regurgitation PHT measures 381 msec. Mild aortic stenosis is present.  Aortic valve mean gradient measures 13.4 mmHg.   Aortic valve peak gradient measures 23.8 mmHg. Aortic valve area, by VTI  measures 1.65 cm.   Pulmonic Valve: The pulmonic valve was grossly normal. Pulmonic valve  regurgitation is moderate. No evidence of pulmonic stenosis.   Aorta: Ascending aorta likely upper limit of normal for age when indexed  to BSA. The aortic root is normal in size and structure.   Venous: The inferior vena cava is normal in size with less than 50%  respiratory variability, suggesting right atrial pressure of 8 mmHg.   IAS/Shunts: No atrial level shunt detected by color flow Doppler.     Patient Profile     75 y.o. male  with hx of HFpEF, CAD s/p prior PCI to LAD + RCA, moderate MR/MS, persistent atrial fibrillation/flutter,  essential hypertension, COPD on home O2, who is being seen 11/21/2020 for the evaluation of acute on chronic heart failure with preserved ejection fraction.  Assessment & Plan    Acute on Chronic Diastolic HF: in the setting of atrial flutter with valvular disease. BNP 391, CXR small bilateral effusions , 4.8 x 3.2 cm opacity over the left heart with recommendations for follow up CXR. Extremities are warm and dry. Lactic acid 1.3.  -- started IV lasix 40mg  BID initially but transitioned to Diamox 250mg  per Dr. Claiborne Billings. UOP 600cc overnight. Weights not consistent. Will redose with diamox 250mg  x1  per discussion with attending. -- BPs remain soft at times but stable.  -- started on jardiance on admission but will hold given low BP. Plan to resume at discharge    Valvular disease: Echo 8/22 with mean mitral valve gradient is 7.0 mmHg. Progressive disease over the past couple of years. Moderate to severe TR, along with severe aortic regurgitation, mild AS. Will need TEE, slotted for Friday @1  until able to get a sooner appt. Will keep NPO at midnight in the event slot opens for tomorrow.  Persistent Atrial fib/flutter: rates are controlled in the 50-60 range. Intermittent episodes of sinus, mostly flutter. With intermittent episodes of SR noted yesterday not a candidate for DCCV. Started on Tikosyn 12/2019, last seen in the Afib clinic 3/22 for follow up with plans for DCCV but noted in SR. Did have 2 separate episodes of bradycardia, rates in the 30s. Will reduce tikosyn from 595mcg BID to 220mcg BID. Check EKG this morning -- continue Eliquis    CAD: s/p stenting to LAD and RCA '02 &'03. No chest pain PTA. hsTn 48>>47 in the setting of CHF with valvular disease   HLD: on crestor 10mg  daily   Chronic respiratory failure/COPD: uses 3L  all the time at home. Has not had to increase PTA. CO2 noted at 51 yesterday, repeat of 42 today. Review of labs note range of upper 30s to low 40s historically in the  setting of his COPD.   HTN: blood pressures stable, soft at times -- holding losartan and spiro to allow for diuresis   Thrombocytopenia: 90-100s on review of labs. No reports of bleeding PTA. Folate and B12 levels ok   For questions or updates, please contact [place] Please consult [email or web address] for contact info under        Signed, Reino Bellis, NP  11/23/2020, 10:58 AM     Patient seen and examined. Agree with assessment and plan.  Patient feels improved today.  He is not short of breath.  He has converted to sinus rhythm and currently is sinus bradycardic in the 50s.  Apparently he had had 2 episodes with bradycardia in the 30s.  As result we will decrease Tikosyn from 500 mg twice a day to 250 twice a day.  He received 1 dose of Diamox yesterday with CO2 at 45 with slight improvement to 42.  Creatinine 1.07 today.  We will give an additional dose of Diamox today.  Furosemide was discontinued yesterday.  Personally reviewed the 2D echo Doppler study from October 05, 2020 with Dr. Margaretann Loveless.  He had hyperdynamic LV function with basal hypertrophy.  There was significant annular calcification of his mitral valve with suggestion of moderately severe mitral stenosis.  In addition he did have significant aortic insufficiency and LVOT.  We will try to have TEE done tomorrow if schedule will allow.  B12 and folate levels are normal.  Continue to hold losartan and spironolactone.   Troy Sine, MD, [facility] 11/23/2020 12:24 PM

## 2020-11-23 NOTE — TOC Progression Note (Addendum)
Transition of Care [facility]) - Progression Note    Patient Details  Name: Joshua [name] MRN: [id number] Date of Birth: 06-27-1945  Transition of Care [facility]) CM/SW Contact  Zenon Mayo, RN Phone Number: 11/23/2020, 4:14 PM  Clinical Narrative:    NCM notified April in [place]. [place] notification was done.         Expected Discharge Plan and Services                                                 Social Determinants of Health (SDOH) Interventions    Readmission Risk Interventions No flowsheet data found.

## 2020-11-24 ENCOUNTER — Other Ambulatory Visit (HOSPITAL_COMMUNITY): Payer: No Typology Code available for payment source

## 2020-11-24 ENCOUNTER — Encounter (HOSPITAL_COMMUNITY)
Admission: EM | Disposition: A | Discharge status: Hospice | Payer: Self-pay | Source: Home / Self Care | Attending: Cardiovascular Disease

## 2020-11-24 ENCOUNTER — Encounter (HOSPITAL_COMMUNITY): Payer: Self-pay | Admitting: Certified Registered Nurse Anesthetist

## 2020-11-24 ENCOUNTER — Encounter (HOSPITAL_COMMUNITY): Payer: Self-pay | Admitting: Internal Medicine

## 2020-11-24 DIAGNOSIS — I359 Nonrheumatic aortic valve disorder, unspecified: Secondary | ICD-10-CM

## 2020-11-24 DIAGNOSIS — I5033 Acute on chronic diastolic (congestive) heart failure: Secondary | ICD-10-CM | POA: Diagnosis not present

## 2020-11-24 DIAGNOSIS — I342 Nonrheumatic mitral (valve) stenosis: Secondary | ICD-10-CM | POA: Diagnosis not present

## 2020-11-24 LAB — BASIC METABOLIC PANEL
Anion gap: 4 — ABNORMAL LOW (ref 5–15)
BUN: 29 mg/dL — ABNORMAL HIGH (ref 8–23)
CO2: 44 mmol/L — ABNORMAL HIGH (ref 22–32)
Calcium: 8.6 mg/dL — ABNORMAL LOW (ref 8.9–10.3)
Chloride: 87 mmol/L — ABNORMAL LOW (ref 98–111)
Creatinine, Ser: 1.45 mg/dL — ABNORMAL HIGH (ref 0.61–1.24)
GFR, Estimated: 50 mL/min — ABNORMAL LOW (ref >60)
Glucose, Bld: 101 mg/dL — ABNORMAL HIGH (ref 70–99)
Potassium: 5 mmol/L (ref 3.5–5.1)
Sodium: 135 mmol/L (ref 135–145)

## 2020-11-24 LAB — MAGNESIUM: Magnesium: 2.7 mg/dL — ABNORMAL HIGH (ref 1.7–2.4)

## 2020-11-24 SURGERY — CANCELLED PROCEDURE

## 2020-11-24 MED ORDER — SODIUM CHLORIDE 0.9 % IV SOLN
INTRAVENOUS | Status: DC | PRN
  Administered 2020-11-24: 500 mL via INTRAVENOUS

## 2020-11-24 MED ORDER — SODIUM CHLORIDE 0.9 % IV SOLN: INTRAVENOUS | Status: DC

## 2020-11-24 NOTE — Progress Notes (Signed)
Pt presented to endoscopy suite today for TEE. Pt told MDA R. Ola Spurr he was having a procedure on his eye. As a result, the patient was not able to consent to the procedure. MD Gardiner Rhyme was notified and elected to cancel the procedure for today.   Debarah Crape, BSN, RN

## 2020-11-24 NOTE — Anesthesia Preprocedure Evaluation (Deleted)
Anesthesia Evaluation  Patient identified by MRN, date of birth, ID band Patient awake and Patient confused    Reviewed: Allergy & Precautions, NPO status , Patient's Chart, lab work & pertinent test results  Airway Mallampati: II  TM Distance: >3 FB     Dental   Pulmonary COPD, former smoker,    breath sounds clear to auscultation       Cardiovascular hypertension, + CAD and +CHF  + Valvular Problems/Murmurs MR  Rhythm:Regular Rate:Normal  Valvular disease: Echo 8/22 with mean mitral valve gradient is 7.0 mmHg. Progressive disease over the past couple of years. Moderate to severe TR, along with severe aortic regurgitation, mild AS and significant MAC with moderately severe MS and moderate MR. Added on for TEE later today. No dynamic obstruction on last echo but HOCM in past   Neuro/Psych negative neurological ROS     GI/Hepatic Neg liver ROS, hiatal hernia,   Endo/Other  negative endocrine ROS  Renal/GU Renal InsufficiencyRenal disease     Musculoskeletal   Abdominal   Peds  Hematology negative hematology ROS (+)   Anesthesia Other Findings   Reproductive/Obstetrics                             Anesthesia Physical Anesthesia Plan  ASA: 3  Anesthesia Plan: MAC   Post-op Pain Management:    Induction:   PONV Risk Score and Plan: 1 and Propofol infusion  Airway Management Planned: Natural Airway and Nasal Cannula  Additional Equipment:   Intra-op Plan:   Post-operative Plan:   Informed Consent: I have reviewed the patients History and Physical, chart, labs and discussed the procedure including the risks, benefits and alternatives for the proposed anesthesia with the patient or authorized representative who has indicated his/her understanding and acceptance.       Plan Discussed with: CRNA  Anesthesia Plan Comments:         Anesthesia Quick Evaluation

## 2020-11-24 NOTE — Progress Notes (Signed)
Progress Note  Patient Name: Joshua [name] Date of Encounter: 11/24/2020  [facility] Cardiologist: Shelva Majestic, MD   Subjective   No chest pain; added-on schedule for TEE later today  Inpatient Medications    Scheduled Meds:  apixaban  5 mg Oral BID   dofetilide  250 mcg Oral BID   influenza vaccine adjuvanted  0.5 mL Intramuscular Tomorrow-1000   pantoprazole  40 mg Oral Daily   rosuvastatin  10 mg Oral Daily   sodium chloride flush  3 mL Intravenous Q12H   tamsulosin  0.4 mg Oral Daily   Continuous Infusions:  sodium chloride     sodium chloride     PRN Meds: sodium chloride, acetaminophen, benzonatate, ipratropium, polyethylene glycol, sodium chloride flush   Vital Signs    Vitals:   11/23/20 0733 11/23/20 2036 11/24/20 0457 11/24/20 0743  BP: (!) 118/38 (!) 110/41 (!) 112/43 (!) 108/48  Pulse: (!) 58 (!) 59 (!) 56 62  Resp:  20 18   Temp: (!) 97.4 F (36.3 C) 98.5 F (36.9 C) 98.4 F (36.9 C) (!) 97.5 F (36.4 C)  TempSrc: Oral  Oral Oral  SpO2: 100% 97% 99% (!) 89%  Weight:   77.5 kg   Height:        Intake/Output Summary (Last 24 hours) at 11/24/2020 1206 Last data filed at 11/24/2020 0459 Gross per 24 hour  Intake 723 ml  Output 500 ml  Net 223 ml   Last 3 Weights 11/24/2020 11/23/2020 11/22/2020  Weight (lbs) 170 lb 14.4 oz 169 lb 8 oz 166 lb  Weight (kg) 77.52 kg 76.885 kg 75.297 kg      Telemetry    Atrial flutter rates 50-60, did have 2 episodes with HR in the 30s - Personally Reviewed  ECG    11/23/2020 ECG (independently read by me): NSR at 61, mild 1st degree block, PR 206 msec; IVCD, QTc 477 msec  Physical Exam   BP (!) 108/48 (BP Location: Right Arm)   Pulse 62   Temp (!) 97.5 F (36.4 C) (Oral)   Resp 18   Ht _0  (1.753 m)   Wt 77.5 kg Comment: scale b  SpO2 (!) 89%   BMI 25.24 kg/m  General: Alert, oriented, no distress.  Skin: normal turgor, no rashes, warm and dry HEENT: Normocephalic, atraumatic. Pupils equal  round and reactive to light; sclera anicteric; extraocular muscles intact;  Nose without nasal septal hypertrophy Mouth/Parynx benign; Mallinpatti scale 3 Neck: No JVD, no carotid bruits; normal carotid upstroke Lungs: clear to ausculatation and percussion; no wheezing or rales Chest wall: without tenderness to palpitation Heart: PMI not displaced, RRR, s1 s2 normal, 0-9/3 systolic murmur , 1/6 diastolic murmur, no rubs, gallops, thrills, or heaves Abdomen: soft, nontender; no hepatosplenomehaly, BS+; abdominal aorta nontender and not dilated by palpation. Back: no CVA tenderness Pulses 2+ Musculoskeletal: full range of motion, normal strength, no joint deformities Extremities: venous stasis changes, trace edema L>R  Neurologic: grossly nonfocal; Cranial nerves grossly wnl Psychologic: Normal mood and affect    Labs    High Sensitivity Troponin:   Recent Labs  Lab 11/07/20 1051 11/07/20 1254  TROPONINIHS 48* 47*     Chemistry Recent Labs  Lab 11/21/20 1602 11/22/20 0516 11/23/20 0305 11/24/20 0353  NA 139 139 135 135  K 4.3 4.1 4.6 5.0  CL 91* 90* 89* 87*  CO2 43* 45* 42* 44*  GLUCOSE 97 87 73 101*  BUN 39* 34* 31* 29*  CREATININE 1.05 1.15 1.07 1.45*  CALCIUM 8.4* 8.7* 8.3* 8.6*  MG  --  2.5* 2.4 2.7*  PROT 6.3*  --   --   --   ALBUMIN 3.0*  --   --   --   AST 27  --   --   --   ALT 10  --   --   --   ALKPHOS 40  --   --   --   BILITOT 1.2  --   --   --   GFRNONAA >60 >60 >60 50*  ANIONGAP 5 4* 4* 4*    Lipids No results for input(s): CHOL, TRIG, HDL, LABVLDL, LDLCALC, CHOLHDL in the last 168 hours.  Hematology Recent Labs  Lab 11/21/20 1602  WBC 6.8  RBC 4.07*  HGB 13.3  HCT 43.6  MCV 107.1*  MCH 32.7  MCHC 30.5  RDW 15.6*  PLT 126*   Thyroid No results for input(s): TSH, FREET4 in the last 168 hours.  BNP Recent Labs  Lab 11/21/20 1602  BNP 391.0*    Lipid Panel     Component Value Date/Time   CHOL 149 06/13/2020 1125   CHOL 126  08/20/2012 0930   TRIG 59 06/13/2020 1125   TRIG 113 10/31/2016 0850   TRIG 94 08/20/2012 0930   HDL 79 06/13/2020 1125   HDL 46 10/31/2016 0850   HDL 41 08/20/2012 0930   CHOLHDL 1.9 06/13/2020 1125   LDLCALC 58 06/13/2020 1125   LDLCALC 63 09/29/2013 0907   LDLCALC 66 08/20/2012 0930   LABVLDL 12 06/13/2020 1125    DDimer No results for input(s): DDIMER in the last 168 hours.   Radiology    No results found.  Cardiac Studies   Echo: 10/05/20   IMPRESSIONS     1. Left ventricular ejection fraction, by estimation, is 65 to 70%. The  left ventricle has normal function. The left ventricle has no regional  wall motion abnormalities. There is moderate asymmetric left ventricular  hypertrophy of the basal-septal  segment (15 mm). No dynamic LVOT obstruction demonstrated on this exam.  Left ventricular diastolic parameters are indeterminate.   2. Right ventricular systolic function is mildly reduced. The right  ventricular size is mildly enlarged. There is severely elevated pulmonary  artery systolic pressure. The estimated right ventricular systolic  pressure is 44.9 mmHg.   3. Left atrial size was mild to moderately dilated.   4. The mitral valve is degenerative. Moderate mitral valve regurgitation.  Moderate to severe mitral stenosis. The mean mitral valve gradient is 7.0  mmHg with average heart rate of 55 bpm. Severe mitral annular  calcification.   5. Tricuspid valve regurgitation is moderate to severe.   6. The aortic valve is abnormal. There is mild calcification of the  aortic valve. Aortic valve regurgitation is severe. Mild aortic valve  stenosis. Aortic valve mean gradient measures 13.4 mmHg.   7. Pulmonic valve regurgitation is moderate.   8. The inferior vena cava is normal in size with <50% respiratory  variability, suggesting right atrial pressure of 8 mmHg.   FINDINGS   Left Ventricle: Left ventricular ejection fraction, by estimation, is 65  to 70%. The  left ventricle has normal function. The left ventricle has no  regional wall motion abnormalities. Definity contrast agent was given IV  to delineate the left ventricular   endocardial borders. The left ventricular internal cavity size was normal  in size. There is moderate asymmetric left ventricular hypertrophy of  the  basal-septal segment. Left ventricular diastolic parameters are  indeterminate.   Right Ventricle: The right ventricular size is mildly enlarged. No  increase in right ventricular wall thickness. Right ventricular systolic  function is mildly reduced. There is severely elevated pulmonary artery  systolic pressure. The tricuspid  regurgitant velocity is 3.78 m/s, and with an assumed right atrial  pressure of 8 mmHg, the estimated right ventricular systolic pressure is  86.7 mmHg.   Left Atrium: Left atrial size was mild to moderately dilated.   Right Atrium: Right atrial size was normal in size.   Pericardium: There is no evidence of pericardial effusion.   Mitral Valve: The mitral valve is degenerative in appearance. Severe  mitral annular calcification. Moderate mitral valve regurgitation.  Moderate to severe mitral valve stenosis. MV peak gradient, 18.0 mmHg. The  mean mitral valve gradient is 7.0 mmHg with   average heart rate of 55 bpm.   Tricuspid Valve: The tricuspid valve is normal in structure. Tricuspid  valve regurgitation is moderate to severe. No evidence of tricuspid  stenosis.   Aortic Valve: The aortic valve is abnormal. There is mild calcification of  the aortic valve. Aortic valve regurgitation is severe. Aortic  regurgitation PHT measures 381 msec. Mild aortic stenosis is present.  Aortic valve mean gradient measures 13.4 mmHg.   Aortic valve peak gradient measures 23.8 mmHg. Aortic valve area, by VTI  measures 1.65 cm.   Pulmonic Valve: The pulmonic valve was grossly normal. Pulmonic valve  regurgitation is moderate. No evidence of pulmonic  stenosis.   Aorta: Ascending aorta likely upper limit of normal for age when indexed  to BSA. The aortic root is normal in size and structure.   Venous: The inferior vena cava is normal in size with less than 50%  respiratory variability, suggesting right atrial pressure of 8 mmHg.   IAS/Shunts: No atrial level shunt detected by color flow Doppler.     Patient Profile     75 y.o. male  with hx of HFpEF, CAD s/p prior PCI to LAD + RCA, moderate MR/MS, persistent atrial fibrillation/flutter, essential hypertension, COPD on home O2, who is being seen 11/21/2020 for the evaluation of acute on chronic heart failure with preserved ejection fraction.  Assessment & Plan    Acute on Chronic Diastolic HF: in the setting of atrial flutter with valvular disease. BNP 391, CXR small bilateral effusions , 4.8 x 3.2 cm opacity over the left heart with recommendations for follow up CXR. Extremities are warm and dry. Lactic acid 1.3.  -- started IV lasix 49m BID initially but transitioned to Diamox 2565mper Dr. KeClaiborne BillingsUOP 600cc overnight. Weights not consistent. Will redose with diamox 25028m1 per discussion with attending. -- BPs remain soft at times but stable.  -- started on jardiance on admission but will hold given low BP. Plan to resume at discharge    Valvular disease: Echo 8/22 with mean mitral valve gradient is 7.0 mmHg. Progressive disease over the past couple of years. Moderate to severe TR, along with severe aortic regurgitation, mild AS and significant MAC with moderately severe MS and moderate MR. Added on for TEE later today. No dynamic obstruction on last echo but HOCM in past  Persistent Atrial fib/flutter: Converted to sinus rhythm; rate currently in the 60.   Started on Tikosyn 12/2019, last seen in the Afib clinic 3/22 for follow up with plans for DCCV but noted in SR. Did have 2 separate episodes of bradycardia, rates in  the 30s. Tikosyn was reduced from 573mg BID to 2558m BID. On  eliquis.   CAD: s/p stenting to LAD and RCA '02 &'03. No chest pain PTA. hsTn 48>>47 in the setting of CHF with valvular disease   HLD: on crestor 1027maily   Chronic respiratory failure/COPD: uses 3L [place] all the time at home. Has not had to increase PTA. CO2 noted at 45 27sterday, repeat of 42 today. Review of labs note range of upper 30s to low 40s historically in the setting of his COPD.   HTN: blood pressures stable, soft at times -- holding losartan and spiro to allow for diuresis   Thrombocytopenia: 90-100s on review of labs. No reports of bleeding PTA. Folate and B12 levels ok   For questions or updates, please contact [facility] consult [email or web address] for contact info under        Signed, ThoShelva MajesticD  11/24/2020, 12:06 PM

## 2020-11-24 NOTE — Progress Notes (Signed)
Mobility Specialist Progress Note:   11/24/20 1100  Mobility  Activity Ambulated in hall  Range of Motion/Exercises Active;All extremities  Level of Assistance Standby assist, set-up cues, supervision of patient - no hands on  Assistive Device Front wheel walker  Minutes Ambulated 8 minutes  Distance Ambulated (ft) 520 ft  Mobility Ambulated with assistance in hallway  Mobility Response Tolerated well  Mobility performed by Mobility specialist  Transport method Ambulatory  $Mobility charge 1 Mobility   Pt was received in bed, agreeable to mobility. Ambulated in hall 520' with RW for stability on 3LO2. Pt was asx and tolerated ambulation well. Left in chair with all needs met.   Nelta Numbers Mobility Specialist  Phone [phone]

## 2020-11-24 NOTE — Progress Notes (Signed)
Patient presented for TEE today.  He is currently confused, oriented x2 and states that he is having a procedure done on his eye.  He is not currently consentable.  Called his 2 daughters multiple times but no answer.  Unable to obtain consent for procedure at this time.  Procedure cancelled. D/w Dr Claiborne Billings and Reino Bellis.

## 2020-11-25 ENCOUNTER — Inpatient Hospital Stay (HOSPITAL_COMMUNITY): Payer: No Typology Code available for payment source

## 2020-11-25 DIAGNOSIS — I480 Paroxysmal atrial fibrillation: Secondary | ICD-10-CM | POA: Diagnosis not present

## 2020-11-25 DIAGNOSIS — I342 Nonrheumatic mitral (valve) stenosis: Secondary | ICD-10-CM | POA: Diagnosis not present

## 2020-11-25 DIAGNOSIS — I351 Nonrheumatic aortic (valve) insufficiency: Secondary | ICD-10-CM | POA: Diagnosis not present

## 2020-11-25 DIAGNOSIS — I361 Nonrheumatic tricuspid (valve) insufficiency: Secondary | ICD-10-CM | POA: Diagnosis not present

## 2020-11-25 DIAGNOSIS — I359 Nonrheumatic aortic valve disorder, unspecified: Secondary | ICD-10-CM | POA: Diagnosis not present

## 2020-11-25 DIAGNOSIS — I5033 Acute on chronic diastolic (congestive) heart failure: Secondary | ICD-10-CM | POA: Diagnosis not present

## 2020-11-25 LAB — BASIC METABOLIC PANEL
Anion gap: 6 (ref 5–15)
BUN: 26 mg/dL — ABNORMAL HIGH (ref 8–23)
CO2: 39 mmol/L — ABNORMAL HIGH (ref 22–32)
Calcium: 8.6 mg/dL — ABNORMAL LOW (ref 8.9–10.3)
Chloride: 92 mmol/L — ABNORMAL LOW (ref 98–111)
Creatinine, Ser: 1.05 mg/dL (ref 0.61–1.24)
GFR, Estimated: >60 mL/min (ref >60)
Glucose, Bld: 104 mg/dL — ABNORMAL HIGH (ref 70–99)
Potassium: 4.4 mmol/L (ref 3.5–5.1)
Sodium: 137 mmol/L (ref 135–145)

## 2020-11-25 MED ORDER — GADOBUTROL 1 MMOL/ML IV SOLN
8.0000 mL | Freq: Once | INTRAVENOUS | Status: AC | PRN
  Administered 2020-11-25: 8 mL via INTRAVENOUS

## 2020-11-25 NOTE — Progress Notes (Addendum)
Progress Note  Patient Name: Joshua [name] Date of Encounter: 11/25/2020  [facility] Cardiologist: Shelva Majestic, MD   Subjective   Sitting up in the chair, resting comfortably. No complaints.   Inpatient Medications    Scheduled Meds:  apixaban  5 mg Oral BID   dofetilide  250 mcg Oral BID   influenza vaccine adjuvanted  0.5 mL Intramuscular Tomorrow-1000   pantoprazole  40 mg Oral Daily   rosuvastatin  10 mg Oral Daily   sodium chloride flush  3 mL Intravenous Q12H   tamsulosin  0.4 mg Oral Daily   Continuous Infusions:  sodium chloride     PRN Meds: sodium chloride, acetaminophen, benzonatate, ipratropium, polyethylene glycol, sodium chloride flush   Vital Signs    Vitals:   11/24/20 1325 11/24/20 2022 11/25/20 0452 11/25/20 0900  BP: (!) 116/41 (!) 110/52 (!) 127/47   Pulse: (!) 56 63 61   Resp: _0 Temp: 97.8 F (36.6 C) 97.8 F (36.6 C) 97.7 F (36.5 C)   TempSrc: Temporal Oral Oral   SpO2: 100% 92% 100% 94%  Weight:   77 kg   Height:        Intake/Output Summary (Last 24 hours) at 11/25/2020 1033 Last data filed at 11/25/2020 0500 Gross per 24 hour  Intake 0 ml  Output 775 ml  Net -775 ml   Last 3 Weights 11/25/2020 11/24/2020 11/23/2020  Weight (lbs) 169 lb 12.1 oz 170 lb 14.4 oz 169 lb 8 oz  Weight (kg) 77 kg 77.52 kg 76.885 kg      Telemetry    SR - Personally Reviewed  ECG    No new tracing this morning  Physical Exam   GEN: No acute distress.   Neck: No JVD Cardiac: RRR, 3/6 systolic murmur LLSB, no rubs, or gallops.  Respiratory: Clear to auscultation bilaterally. GI: Soft, nontender, non-distended  MS: No edema; No deformity. Neuro:  Nonfocal  Psych: Normal affect   Labs    High Sensitivity Troponin:   Recent Labs  Lab 11/07/20 1051 11/07/20 1254  TROPONINIHS 48* 47*     Chemistry Recent Labs  Lab 11/21/20 1602 11/22/20 0516 11/23/20 0305 11/24/20 0353 11/25/20 0314  NA 139 139 135 135 137  K 4.3 4.1  4.6 5.0 4.4  CL 91* 90* 89* 87* 92*  CO2 43* 45* 42* 44* 39*  GLUCOSE 97 87 73 101* 104*  BUN 39* 34* 31* 29* 26*  CREATININE 1.05 1.15 1.07 1.45* 1.05  CALCIUM 8.4* 8.7* 8.3* 8.6* 8.6*  MG  --  2.5* 2.4 2.7*  --   PROT 6.3*  --   --   --   --   ALBUMIN 3.0*  --   --   --   --   AST 27  --   --   --   --   ALT 10  --   --   --   --   ALKPHOS 40  --   --   --   --   BILITOT 1.2  --   --   --   --   GFRNONAA >60 >60 >60 50* >60  ANIONGAP 5 4* 4* 4* 6    Lipids No results for input(s): CHOL, TRIG, HDL, LABVLDL, LDLCALC, CHOLHDL in the last 168 hours.  Hematology Recent Labs  Lab 11/21/20 1602  WBC 6.8  RBC 4.07*  HGB 13.3  HCT 43.6  MCV 107.1*  MCH 32.7  MCHC 30.5  RDW 15.6*  PLT 126*   Thyroid No results for input(s): TSH, FREET4 in the last 168 hours.  BNP Recent Labs  Lab 11/21/20 1602  BNP 391.0*    DDimer No results for input(s): DDIMER in the last 168 hours.   Radiology    No results found.  Cardiac Studies   N/a   Patient Profile     75 y.o. male with hx of HFpEF, CAD s/p prior PCI to LAD + RCA, moderate MR/MS, persistent atrial fibrillation/flutter, essential hypertension, COPD on home O2, who is being seen 11/21/2020 for the evaluation of acute on chronic heart failure with preserved ejection fraction.  Assessment & Plan    Acute on Chronic Diastolic HF: in the setting of atrial flutter with valvular disease. BNP 391, CXR small bilateral effusions , 4.8 x 3.2 cm opacity over the left heart with recommendations for follow up CXR. Extremities are warm and dry. Lactic acid 1.3.  -- given Diamox 264m x2, weight are stable -- started on jardiance on admission but held with low BPs. Plan to resume at discharge    Valvular disease: Echo 8/22 with mean mitral valve gradient is 7.0 mmHg. Progressive disease over the past couple of years. Moderate to severe TR, along with severe aortic regurgitation, mild AS and significant MAC with moderately severe MS and  moderate MR. Attempted TEE yesterday but was unable to consent 2/2 confusion.  -- No dynamic obstruction on last echo but HOCM in past -- discussed with Dr. SGardiner Rhyme ordered for cardiac MRI today. Will review with Dr. KClaiborne Billingsabout need for TEE which will have to be next week if deemed necessary. No room on the schedule today.   Persistent Atrial fib/flutter: Remains on NSR.  -- Started on Tikosyn 12/2019, last seen in the Afib clinic 3/22 for follow up with plans for DCCV but noted in SR.  -- Did have 2 separate episodes of bradycardia, rates in the 30s. Tikosyn was reduced from 5078m BID to 25030mBID. On eliquis. -- EKG 9/28 QT 474   CAD: s/p stenting to LAD and RCA '02 &'03. No chest pain PTA. hsTn 48>>47 in the setting of CHF with valvular disease -- no anginal symptoms   HLD: on crestor 66m26mily   Chronic respiratory failure/COPD: uses 3L [place] all the time at home. Has not had to increase PTA. CO2 noted at 45 y91terday, repeat of 42 today. Review of labs note range of upper 30s to low 40s historically in the setting of his COPD.   HTN: blood pressures stable, soft at times -- holding losartan and spiro.    Thrombocytopenia: 90-100s on review of labs. No reports of bleeding PTA. Folate and B12 levels ok   For questions or updates, please contact [facility] consult [email or web address] for contact info under        Signed, Joshua [name]  11/25/2020, 10:33 AM     Patient seen and examined. Agree with assessment and plan.  Patient feels well.  He is maintaining sinus rhythm with first-degree AV block.  He underwent cardiac MRI this morning, results pending.  Patient does have some baseline confusion but is alert and oriented.  There is no room for TEE later this afternoon.  Will have patient eat.  Hopefully discharge this weekend if remains stable and we can schedule TEE as an outpatient   Joshua [name], FACCJackson County Memorial Hospital0/2022 12:00 PM

## 2020-11-25 NOTE — Plan of Care (Signed)

## 2020-11-25 NOTE — Progress Notes (Signed)
Mobility Specialist Progress Note:   11/25/20 0900  Oxygen Therapy  SpO2 94 %  O2 Device Nasal Cannula  Mobility  Head of Bed Elevated  Self regulated  Activity Ambulated in hall  Range of Motion/Exercises Active;All extremities  Level of Assistance Standby assist, set-up cues, supervision of patient - no hands on  Assistive Device Front wheel walker  Minutes Ambulated 8 minutes  Distance Ambulated (ft) 560 ft  Mobility Ambulated with assistance in hallway  Mobility Response Tolerated well  Mobility performed by Mobility specialist  Bed Position Chair  Transport method Ambulatory  $Mobility charge 1 Mobility   Pt received in bed and willing to participate in mobility. Ambulated in hallway with RW at supervision on 3L O2. Returned pt to chair with call bell in reach and all needs met.   [facility] Health and safety inspector Phone [phone]

## 2020-11-26 DIAGNOSIS — I071 Rheumatic tricuspid insufficiency: Secondary | ICD-10-CM

## 2020-11-26 DIAGNOSIS — E7800 Pure hypercholesterolemia, unspecified: Secondary | ICD-10-CM

## 2020-11-26 DIAGNOSIS — I2583 Coronary atherosclerosis due to lipid rich plaque: Secondary | ICD-10-CM

## 2020-11-26 DIAGNOSIS — I351 Nonrheumatic aortic (valve) insufficiency: Secondary | ICD-10-CM | POA: Diagnosis not present

## 2020-11-26 DIAGNOSIS — I10 Essential (primary) hypertension: Secondary | ICD-10-CM

## 2020-11-26 DIAGNOSIS — I340 Nonrheumatic mitral (valve) insufficiency: Secondary | ICD-10-CM

## 2020-11-26 DIAGNOSIS — I5033 Acute on chronic diastolic (congestive) heart failure: Secondary | ICD-10-CM

## 2020-11-26 DIAGNOSIS — I361 Nonrheumatic tricuspid (valve) insufficiency: Secondary | ICD-10-CM | POA: Diagnosis not present

## 2020-11-26 DIAGNOSIS — I421 Obstructive hypertrophic cardiomyopathy: Secondary | ICD-10-CM

## 2020-11-26 DIAGNOSIS — I342 Nonrheumatic mitral (valve) stenosis: Secondary | ICD-10-CM | POA: Diagnosis not present

## 2020-11-26 DIAGNOSIS — I110 Hypertensive heart disease with heart failure: Secondary | ICD-10-CM | POA: Diagnosis not present

## 2020-11-26 DIAGNOSIS — I2510 Atherosclerotic heart disease of native coronary artery without angina pectoris: Secondary | ICD-10-CM

## 2020-11-26 DIAGNOSIS — J449 Chronic obstructive pulmonary disease, unspecified: Secondary | ICD-10-CM

## 2020-11-26 LAB — BASIC METABOLIC PANEL
Anion gap: 4 — ABNORMAL LOW (ref 5–15)
BUN: 25 mg/dL — ABNORMAL HIGH (ref 8–23)
CO2: 39 mmol/L — ABNORMAL HIGH (ref 22–32)
Calcium: 8.5 mg/dL — ABNORMAL LOW (ref 8.9–10.3)
Chloride: 93 mmol/L — ABNORMAL LOW (ref 98–111)
Creatinine, Ser: 1.2 mg/dL (ref 0.61–1.24)
GFR, Estimated: >60 mL/min (ref >60)
Glucose, Bld: 94 mg/dL (ref 70–99)
Potassium: 4.4 mmol/L (ref 3.5–5.1)
Sodium: 136 mmol/L (ref 135–145)

## 2020-11-26 NOTE — Progress Notes (Signed)
Per Dr. Theodosia Blender request, consult request relayed to Dr. Cyndia Bent.

## 2020-11-26 NOTE — Progress Notes (Signed)
Progress Note  Patient Name: Joshua [name] Date of Encounter: 11/26/2020  [facility] Cardiologist: Shelva Majestic, MD   Subjective   Denies any chest pain or SOB.   Inpatient Medications    Scheduled Meds:  apixaban  5 mg Oral BID   dofetilide  250 mcg Oral BID   influenza vaccine adjuvanted  0.5 mL Intramuscular Tomorrow-1000   pantoprazole  40 mg Oral Daily   rosuvastatin  10 mg Oral Daily   sodium chloride flush  3 mL Intravenous Q12H   tamsulosin  0.4 mg Oral Daily   Continuous Infusions:  sodium chloride     PRN Meds: sodium chloride, acetaminophen, benzonatate, ipratropium, polyethylene glycol, sodium chloride flush   Vital Signs    Vitals:   11/25/20 1137 11/25/20 2030 11/26/20 0436 11/26/20 1035  BP: (!) 117/41 (!) 116/48 (!) 118/43 (!) 110/50  Pulse: 65 (!) 55 (!) 58 64  Resp: _0 Temp: 98.1 F (36.7 C) 98.3 F (36.8 C) 97.7 F (36.5 C) 98.4 F (36.9 C)  TempSrc: Oral Oral Oral Oral  SpO2: 95% 97% 97% 94%  Weight:   75.4 kg   Height:        Intake/Output Summary (Last 24 hours) at 11/26/2020 1100 Last data filed at 11/26/2020 7253 Gross per 24 hour  Intake 680 ml  Output 525 ml  Net 155 ml    Last 3 Weights 11/26/2020 11/25/2020 11/24/2020  Weight (lbs) 166 lb 4.8 oz 169 lb 12.1 oz 170 lb 14.4 oz  Weight (kg) 75.433 kg 77 kg 77.52 kg      Telemetry    NSR- Personally Reviewed  ECG    No new tracing this morning  Physical Exam   GEN: Well nourished, well developed in no acute distress HEENT: Normal NECK: No JVD; No carotid bruits LYMPHATICS: No lymphadenopathy CARDIAC:RRR, no rubs, gallops.  2/6 SM at LUSB RESPIRATORY:  Clear to auscultation without rales, wheezing or rhonchi  ABDOMEN: Soft, non-tender, non-distended MUSCULOSKELETAL:  No edema; No deformity  SKIN: Warm and dry NEUROLOGIC:  Alert and oriented x 3 PSYCHIATRIC:  Normal affect   Labs    High Sensitivity Troponin:   Recent Labs  Lab 11/07/20 1051  11/07/20 1254  TROPONINIHS 48* 47*      Chemistry Recent Labs  Lab 11/21/20 1602 11/22/20 0516 11/23/20 0305 11/24/20 0353 11/25/20 0314 11/26/20 0309  NA 139 139 135 135 137 136  K 4.3 4.1 4.6 5.0 4.4 4.4  CL 91* 90* 89* 87* 92* 93*  CO2 43* 45* 42* 44* 39* 39*  GLUCOSE 97 87 73 101* 104* 94  BUN 39* 34* 31* 29* 26* 25*  CREATININE 1.05 1.15 1.07 1.45* 1.05 1.20  CALCIUM 8.4* 8.7* 8.3* 8.6* 8.6* 8.5*  MG  --  2.5* 2.4 2.7*  --   --   PROT 6.3*  --   --   --   --   --   ALBUMIN 3.0*  --   --   --   --   --   AST 27  --   --   --   --   --   ALT 10  --   --   --   --   --   ALKPHOS 40  --   --   --   --   --   BILITOT 1.2  --   --   --   --   --   GFRNONAA >  60 >60 >60 50* >60 >60  ANIONGAP 5 4* 4* 4* 6 4*     Lipids No results for input(s): CHOL, TRIG, HDL, LABVLDL, LDLCALC, CHOLHDL in the last 168 hours.  Hematology Recent Labs  Lab 11/21/20 1602  WBC 6.8  RBC 4.07*  HGB 13.3  HCT 43.6  MCV 107.1*  MCH 32.7  MCHC 30.5  RDW 15.6*  PLT 126*    Thyroid No results for input(s): TSH, FREET4 in the last 168 hours.  BNP Recent Labs  Lab 11/21/20 1602  BNP 391.0*     DDimer No results for input(s): DDIMER in the last 168 hours.   Radiology    MR CARDIAC MORPHOLOGY W WO CONTRAST  Result Date: 11/25/2020 CLINICAL DATA:  Evaluate HCM, valvular heart disease EXAM: CARDIAC MRI TECHNIQUE: The patient was scanned on a 1.5 Tesla Siemens magnet. A dedicated cardiac coil was used. Functional imaging was done using Fiesta sequences. 2,3, and 4 chamber views were done to assess for RWMA's. Modified Simpson's rule using a short axis stack was used to calculate an ejection fraction on a dedicated work Conservation officer, nature. The patient received 8 cc of Gadavist. After 10 minutes inversion recovery sequences were used to assess for infiltration and scar tissue. CONTRAST:  8 cc  of Gadavist FINDINGS: Left ventricle: -Asymmetric hypertrophy measuring 50m in basal septum  (746min posterior wall) -Mild dilatation -Normal systolic function -Nonspecific ECV elevation (36%) -Patchy LGE at RV insertion sites and basal septum. LGE accounts for 12% of total myocardial mass LV EF: 61% (Normal 56-78%) Absolute volumes: LV EDV: 18748mNormal 77-195 mL) LV ESV: 37m34mormal 19-72 mL) LV SV: 113mL67mrmal 51-133 mL) CO: 6.3L/min (Normal 2.8-8.8 L/min) Indexed volumes: LV EDV: 97mL/32m (Normal 47-92 mL/sq-m) LV ESV: 38mL/s16m(Normal 13-30 mL/sq-m) LV SV: 59mL/sq14mNormal 32-62 mL/sq-m) CI: 3.3L/min/sq-m (Normal 1.7-4.2 L/min/sq-m) Right ventricle: Mild dilatation with normal systolic function RV EF:  50% (Normal 47-74%) Absolute volumes: RV EDV: 207mL (No26m 88-227 mL) RV ESV: 103mL (Nor30m23-103 mL) RV SV: 105mL (Norm63m2-138 mL) CO: 5.8L/min (Normal 2.8-8.8 L/min) Indexed volumes: RV EDV: 107mL/sq-m (44mal 55-105 mL/sq-m) RV ESV: 53mL/sq-m (N15ml 15-43 mL/sq-m) RV SV: 54mL/sq-m (No15m 32-64 mL/sq-m) CI: 3.0L/min/sq-m (Normal 1.7-4.2 L/min/sq-m) Left atrium: Moderate enlargement Right atrium: Mild enlargement Mitral valve: Moderate regurgitation with regurgitant fraction 35% (40% cutoff for severe in cardiac MRI) Aortic valve: Moderate to severe regurgitation with regurgitant fraction 32% (35% cutoff for severe in cardiac MRI) Tricuspid valve: Moderate to severe regurgitation with regurgitant fraction 38% (40% cutoff for severe in cardiac MRI) Pulmonic valve: Mild regurgitation with regurgitant fraction 12% Aorta: Normal proximal ascending aorta Pulmonary artery: Dilated main PA measuring 36mm Pericardi57mNormal Extracardiac structures: Bilateral lower lobe airspace disease, consider CT chest IMPRESSION: 1. Asymmetric LV hypertrophy measuring 21mm in basal s68mm (7mm in posterior68mll), consistent with hypertrophic cardiomyopathy 2. Patchy late gadolinium enhancement at RV insertion sites and basal septum, consistent with HCM. LGE accounts for 12% of total myocardial mass 3.  Mild LV  dilatation with normal systolic function (EF 61%) 4.  Mild RV 76%atation with normal systolic function (EF 50%) 5. Moderate 73%severe aortic valve regurgitation with regurgitant fraction 32% (35% cutoff for severe AI in cardiac MRI) 6. Moderate to severe tricuspid valve regurgitation with regurgitant fraction 38% (40% cutoff for severe TR) 7. Moderate mitral valve regurgitation with regurgitant fraction 35% (40% cutoff for severe MR) 8.  Mild pulmonic valve regurgitation with regurgitant fraction 12% 9. Bilateral lower lobe  airspace disease, consider CT chest for further evaluation Electronically Signed   By: Oswaldo Milian M.D.   On: 11/25/2020 18:10    Cardiac Studies   N/a   Patient Profile     75 y.o. male with hx of HFpEF, CAD s/p prior PCI to LAD + RCA, moderate MR/MS, persistent atrial fibrillation/flutter, essential hypertension, COPD on home O2, who is being seen 11/21/2020 for the evaluation of acute on chronic heart failure with preserved ejection fraction.  Assessment & Plan    Acute on Chronic Diastolic HF: in the setting of atrial flutter with valvular disease. BNP 391, CXR small bilateral effusions , 4.8 x 3.2 cm opacity over the left heart with recommendations for follow up CXR. Extremities are warm and dry. Lactic acid 1.3.  -- given Diamox 235m x2, weight are stable -- I&O's are incomplete -- weight down 3 lbs from yesterday -- SCr bumped slightly from 1.05>1.2 -- diuretics on hold for now -- started on jardiance on admission but held with low BPs. Plan to resume at discharge    Valvular disease: Echo 8/22 with mean mitral valve gradient is 7.0 mmHg. Progressive disease over the past couple of years. Moderate to severe TR, along with severe aortic regurgitation, mild AS and significant MAC with moderately severe MS and moderate MR. Attempted TEE 9/30 but was unable to consent 2/2 confusion.  -- No dynamic obstruction on last echo but HOCM in past -- cMRI showed severe  ASSH c/w HOCM and patchy LGE with mild LVE and normal LVF, mild RVE and normal RVF, moderate to severe AR with RF 32%, moderate to severe TR (RF 38%) and moderate MR (RF 35%) and bilateral LL airspace disease.   -- suspect he may not be a SAVR candidate given his debilitated state and underlying lung disease -- before proceeding with TEE or heart cath, will ask CVTS to evaluate and if they do not feel he is a candidate for SVAR/TAVR then would recommend Palliative Care Consult  Persistent Atrial fib/flutter: Remains on NSR.  -- Started on Tikosyn 12/2019, last seen in the Afib clinic 3/22 for follow up with plans for DCCV but noted in SR.  -- Did have 2 separate episodes of bradycardia, rates in the 30s. Tikosyn was reduced from 5052m BID to 25039mBID.  -- tele reviewed this am and now back in atrial flutter with variable block and CVR>>it may be hard to maintain NSR with his underlying valvular heart disease -- continue On eliquis. -- EKG 9/30 QT 452m74mCAD: s/p stenting to LAD and RCA '02 &'03. No chest pain PTA. hsTn 48>>47 in the setting of CHF with valvular disease -- no anginal symptoms -- continue statin -- no ASA due to DOAC -- will need right and left heart cath if deemed to be valve surgical candidate  HLD:  -- LDL goal < 70 -- LDL was 58 in April 2022 -- continue on crestor 10mg52mly   Chronic respiratory failure/COPD: uses 3L [place] all the time at home. Has not had to increase PTA. CO2 noted at 45 ye44erday, repeat of 43 today. Review of labs note range of upper 30s to low 40s historically in the setting of his COPD.   HTN: blood pressures controlled today at 110/50mmH76m holding losartan and spiro.    Thrombocytopenia:  -- platelet count 90-100s on review of labs and this am 126K --  No reports of bleeding PTA. Folate and B12 levels ok   I have  spent a total of 30 minutes with patient reviewing cMRI, 2D echo , telemetry, EKGs, labs and examining patient as well as  establishing an assessment and plan that was discussed with the patient.  > 50% of time was spent in direct patient care.     For questions or updates, please contact [place] Please consult [email or web address] for contact info under        Signed, Fransico Him, MD  11/26/2020, 11:00 AM

## 2020-11-26 NOTE — Consult Note (Signed)
[address]       [place], [address]             [phone]      Cardiothoracic Surgery Consultation  Reason for Consult: Moderate to severe aortic insufficiency, moderate to severe mitral stenosis and insufficiency, and moderate to severe tricuspid regurgitation with acute on chronic diastolic congestive heart failure  Referring Physician: Dr. Shelva Majestic  Joshua [name] is an 75 y.o. male.  HPI:   The patient is a 75 year old gentleman with a history of severe COPD on home oxygen at 3 L/min, hypertension, hyperlipidemia, HOCM, persistent atrial fibrillation, coronary artery disease status post prior PCI to the LAD and RCA, HFpEF, with known moderate mitral regurgitation and mitral stenosis that has been progressive over the past few years.  He tells me that he has been feeling fine at home although he knows that he can exert himself too much.  He has still been mowing his yard on a riding lawnmower.  He reports exertional fatigue and shortness of breath that limits his activity level.  He is also had some swelling in his legs.  He said that he came to the emergency room to upset his daughters but has not noticed any major change in how he is been feeling at home.  He was noted to be volume overloaded on exam with some evidence of pulmonary edema on chest x-ray.  His BNP was 391.  2D echocardiogram showed a left ventricular ejection fraction of 65 to 70% with moderate asymmetric left ventricular hypertrophy of the basal septum measured at 15 mm.  There was no dynamic LVOT obstruction noted on echo exam.  Right ventricular function was mildly reduced with mild right ventricular cavity enlargement.  There was severely elevated pulmonary artery systolic pressure with an estimated RV systolic pressure of 85.2 mmHg.  The mitral valve was heavily calcified with moderate to severe stenosis with a mean gradient of 7 mmHg.  The aortic valve had mild calcification with moderate to  severe regurgitation.  AI pressure half-time was 381 ms.  The mean gradient across aortic valve was 13.4 with peak gradient of 24 mmHg.  There was moderate to severe tricuspid regurgitation.  He underwent a cardiac MRI showing asymmetric hypertrophy of the basal septum at 21 mm.  Left ventricular ejection fraction was 61%.  Right ventricular ejection fraction was 50% with mild dilatation.  There was moderate mitral regurgitation with a regurgitant fraction of 35%, moderate to severe aortic insufficiency with a regurgitant fraction of 32% and moderate to severe tricuspid regurgitation with a regurgitant fraction of 38%.  He has been followed by pulmonary medicine as an outpatient and spirometry this year showed severe COPD with an FEV1 of 1.23 (41%) FVC of 1.68 (38%) and DLCO of 42% also consistent with a mild restrictive defect and moderate diffusion defect.  He has a long history of bilateral pleural space fibrosis. Past Medical History:  Diagnosis Date   Acute lower GI bleeding    related to gastritis / viral infection   Cataract    Cirrhosis ([facility])    Congestive heart failure (CHF) ([facility]) 11/21/2020   COPD (chronic obstructive pulmonary disease) ([facility])    PFT 05-05-2010, FEV1 .9 (26%) ratio 60   Coronary artery disease    Gastritis 06-10-2006   Hiatal hernia 06-10-2006   EGD   HOH (hard of hearing)    Hx of adenomatous colonic polyps 2005, 2013   Colonoscopy  Hypercholesteremia    Hypertension    Hypertr obst cardiomyop    mild subaortic stenosis   Iron deficiency anemia, unspecified    LVH (left ventricular hypertrophy)    Other B-complex deficiencies    Persistent atrial fibrillation (HCC)    persistent   Rheumatic fever    Thrombocytopenia ([facility]) 05/2008    Past Surgical History:  Procedure Laterality Date   ATRIAL FLUTTER ABLATION  06/2010   CTI ablation performed by [name]   CARDIAC CATHETERIZATION  05/28/2005   Widely patent coronaries and normal LV function.   CARDIAC  CATHETERIZATION  08/11/2001   80% LAD stenosis successfully stented with a 3.5x93mm Cypher DES postdilated to 3.60mm resulting in reduction of 80% to 0%.   CARDIAC CATHETERIZATION  01/15/2001   Focal 95% proximal RCA stenosis, stented with a 3x55mm Zeta multilink stent with postdilatation utilizing a 3.5x104mm Quantum balloon with stenosis being reduced to 0%.   CARDIOVASCULAR STRESS TEST  05/29/2011   No scintigraphic evidence of inducible myocardial ischemia. No ECG changes. EKG negative for ischemia.   CARDIOVERSION N/A 10/20/2019   Procedure: CARDIOVERSION;  Surgeon: Sanda Klein, MD;  Location: [facility];  Service: Cardiovascular;  Laterality: N/A;   CARDIOVERSION N/A 01/14/2020   Procedure: CARDIOVERSION;  Surgeon: Buford Dresser, MD;  Location: [facility] ENDOSCOPY;  Service: Cardiovascular;  Laterality: N/A;   CORONARY ANGIOPLASTY     2002-2003   SHOULDER SURGERY     TRANSTHORACIC ECHOCARDIOGRAM  05/29/2011   EF >70%, severe concentric LV hypertrophy, severe mitral annular calcification, mild-moderate aortic regurg,     Family History  Problem Relation Age of Onset   Heart attack Brother 81       Ventircular rupture   Emphysema Brother    Lung cancer Brother    Heart disease Mother        Enlarged heart   Stroke Mother    Hyperlipidemia Father    CAD Father    Hypertension Sister    CAD Paternal Uncle    Stroke Paternal Uncle    Cancer Maternal Grandfather        colon   Colon cancer Maternal Grandfather    Asthma Brother     Social History:  reports that he quit smoking about 35 years ago. His smoking use included cigarettes. He has a 10.00 pack-year smoking history. He has never used smokeless tobacco. He reports that he does not drink alcohol and does not use drugs.  Allergies: No Known Allergies  Medications: I have reviewed the patient's current medications. Prior to Admission:  Medications Prior to Admission  Medication Sig Dispense Refill Last Dose   apixaban  (ELIQUIS) 5 MG TABS tablet Take 1 tablet (5 mg total) by mouth 2 (two) times daily. 180 tablet 1 11/21/2020 at 0630   cholecalciferol (VITAMIN D) 1000 units tablet Take 1,000 Units by mouth daily.   11/20/2020 or 11/21/2020   cyanocobalamin ([employer] VITAMIN B12) 1000 MCG tablet Take 1 tablet (1,000 mcg total) by mouth daily.   11/20/2020 or 11/21/2020   dofetilide (TIKOSYN) 500 MCG capsule Take 1 capsule (500 mcg total) by mouth 2 (two) times daily. 60 capsule 6 11/21/2020 at 0630   furosemide (LASIX) 20 MG tablet Take 20 mg by mouth 2 (two) times daily.   11/21/2020   losartan (COZAAR) 25 MG tablet Take 1 tablet (25 mg total) by mouth daily.   11/20/2020 or 11/21/2020   omeprazole (PRILOSEC) 10 MG capsule TAKE ONE CAPSULE BY MOUTH ONE TIME DAILY (Patient taking  differently: Take 10 mg by mouth daily.) 90 capsule 0 11/20/2020 or 11/21/2020   rosuvastatin (CRESTOR) 10 MG tablet Take 1 tablet (10 mg total) by mouth daily. 90 tablet 3 11/20/2020 or 11/21/2020   tamsulosin (FLOMAX) 0.4 MG CAPS capsule Take 1 capsule (0.4 mg total) by mouth daily. 90 capsule 3 11/20/2020 or 11/21/2020   polyethylene glycol (MIRALAX / GLYCOLAX) packet Take 17 g by mouth as needed for mild constipation.  (Patient not taking: Reported on 11/22/2020)   Not Taking   Scheduled:  apixaban  5 mg Oral BID   dofetilide  250 mcg Oral BID   influenza vaccine adjuvanted  0.5 mL Intramuscular Tomorrow-1000   pantoprazole  40 mg Oral Daily   rosuvastatin  10 mg Oral Daily   sodium chloride flush  3 mL Intravenous Q12H   tamsulosin  0.4 mg Oral Daily   Continuous:  sodium chloride     RCB:ULAGTX chloride, acetaminophen, benzonatate, ipratropium, polyethylene glycol, sodium chloride flush Anti-infectives (From admission, onward)    None       Results for orders placed or performed during the hospital encounter of 11/21/20 (from the past 48 hour(s))  Basic metabolic panel     Status: Abnormal   Collection Time: 11/25/20  3:14 AM   Result Value Ref Range   Sodium 137 135 - 145 mmol/L   Potassium 4.4 3.5 - 5.1 mmol/L   Chloride 92 (L) 98 - 111 mmol/L   CO2 39 (H) 22 - 32 mmol/L   Glucose, Bld 104 (H) 70 - 99 mg/dL    Comment: Glucose reference range applies only to samples taken after fasting for at least 8 hours.   BUN 26 (H) 8 - 23 mg/dL   Creatinine, Ser 1.05 0.61 - 1.24 mg/dL   Calcium 8.6 (L) 8.9 - 10.3 mg/dL   GFR, Estimated >60 >60 mL/min    Comment: (NOTE) Calculated using the CKD-EPI Creatinine Equation (2021)    Anion gap 6 5 - 15    Comment: Performed at [facility] [address] [place], [place] [address]  Basic metabolic panel     Status: Abnormal   Collection Time: 11/26/20  3:09 AM  Result Value Ref Range   Sodium 136 135 - 145 mmol/L   Potassium 4.4 3.5 - 5.1 mmol/L   Chloride 93 (L) 98 - 111 mmol/L   CO2 39 (H) 22 - 32 mmol/L   Glucose, Bld 94 70 - 99 mg/dL    Comment: Glucose reference range applies only to samples taken after fasting for at least 8 hours.   BUN 25 (H) 8 - 23 mg/dL   Creatinine, Ser 1.20 0.61 - 1.24 mg/dL   Calcium 8.5 (L) 8.9 - 10.3 mg/dL   GFR, Estimated >60 >60 mL/min    Comment: (NOTE) Calculated using the CKD-EPI Creatinine Equation (2021)    Anion gap 4 (L) 5 - 15    Comment: Performed at [facility] [address] [place], [place] [address]    MR CARDIAC MORPHOLOGY W WO CONTRAST  Result Date: 11/25/2020 CLINICAL DATA:  Evaluate HCM, valvular heart disease EXAM: CARDIAC MRI TECHNIQUE: The patient was scanned on a 1.5 Tesla Siemens magnet. A dedicated cardiac coil was used. Functional imaging was done using [employer] sequences. 2,3, and 4 chamber views were done to assess for RWMA's. Modified Simpson's rule using a short axis stack was used to calculate an ejection fraction on a dedicated work Conservation officer, nature. The  patient received 8 cc of Gadavist. After 10 minutes inversion recovery sequences were used to assess for infiltration and  scar tissue. CONTRAST:  8 cc  of Gadavist FINDINGS: Left ventricle: -Asymmetric hypertrophy measuring 74mm in basal septum (51mm in posterior wall) -Mild dilatation -Normal systolic function -Nonspecific ECV elevation (36%) -Patchy LGE at RV insertion sites and basal septum. LGE accounts for 12% of total myocardial mass LV EF: 61% (Normal 56-78%) Absolute volumes: LV EDV: 125mL (Normal 77-195 mL) LV ESV: 52mL (Normal 19-72 mL) LV SV: 144mL (Normal 51-133 mL) CO: 6.3L/min (Normal 2.8-8.8 L/min) Indexed volumes: LV EDV: 5mL/sq-m (Normal 47-92 mL/sq-m) LV ESV: 23mL/sq-m (Normal 13-30 mL/sq-m) LV SV: 2mL/sq-m (Normal 32-62 mL/sq-m) CI: 3.3L/min/sq-m (Normal 1.7-4.2 L/min/sq-m) Right ventricle: Mild dilatation with normal systolic function RV EF:  50% (Normal 47-74%) Absolute volumes: RV EDV: 265mL (Normal 88-227 mL) RV ESV: 169mL (Normal 23-103 mL) RV SV: 152mL (Normal 52-138 mL) CO: 5.8L/min (Normal 2.8-8.8 L/min) Indexed volumes: RV EDV: 142mL/sq-m (Normal 55-105 mL/sq-m) RV ESV: 72mL/sq-m (Normal 15-43 mL/sq-m) RV SV: 31mL/sq-m (Normal 32-64 mL/sq-m) CI: 3.0L/min/sq-m (Normal 1.7-4.2 L/min/sq-m) Left atrium: Moderate enlargement Right atrium: Mild enlargement Mitral valve: Moderate regurgitation with regurgitant fraction 35% (40% cutoff for severe in cardiac MRI) Aortic valve: Moderate to severe regurgitation with regurgitant fraction 32% (35% cutoff for severe in cardiac MRI) Tricuspid valve: Moderate to severe regurgitation with regurgitant fraction 38% (40% cutoff for severe in cardiac MRI) Pulmonic valve: Mild regurgitation with regurgitant fraction 12% Aorta: Normal proximal ascending aorta Pulmonary artery: Dilated main PA measuring 51mm Pericardium: Normal Extracardiac structures: Bilateral lower lobe airspace disease, consider CT chest IMPRESSION: 1. Asymmetric LV hypertrophy measuring 96mm in basal septum (35mm in posterior wall), consistent with hypertrophic cardiomyopathy 2. Patchy late gadolinium  enhancement at RV insertion sites and basal septum, consistent with HCM. LGE accounts for 12% of total myocardial mass 3.  Mild LV dilatation with normal systolic function (EF 60%) 4.  Mild RV dilatation with normal systolic function (EF 10%) 5. Moderate to severe aortic valve regurgitation with regurgitant fraction 32% (35% cutoff for severe AI in cardiac MRI) 6. Moderate to severe tricuspid valve regurgitation with regurgitant fraction 38% (40% cutoff for severe TR) 7. Moderate mitral valve regurgitation with regurgitant fraction 35% (40% cutoff for severe MR) 8.  Mild pulmonic valve regurgitation with regurgitant fraction 12% 9. Bilateral lower lobe airspace disease, consider CT chest for further evaluation Electronically Signed   By: Oswaldo Milian M.D.   On: 11/25/2020 18:10    Review of Systems  Constitutional:  Positive for activity change and fatigue.  HENT: Negative.    Eyes: Negative.   Respiratory:  Positive for shortness of breath.   Cardiovascular:  Positive for leg swelling. Negative for chest pain.  Gastrointestinal:  Positive for nausea.  Endocrine: Negative.   Genitourinary: Negative.   Musculoskeletal: Negative.   Skin: Negative.   Allergic/Immunologic: Negative.   Neurological:  Negative for dizziness and syncope.  Hematological: Negative.   Psychiatric/Behavioral: Negative.    Blood pressure (!) 110/50, pulse 64, temperature 98.4 F (36.9 C), temperature source Oral, resp. rate 19, height 5\' 9"  (1.753 m), weight 75.4 kg, SpO2 94 %. Physical Exam Constitutional:      Comments: Frail and chronically ill-appearing  HENT:     Head: Normocephalic and atraumatic.  Eyes:     Extraocular Movements: Extraocular movements intact.     Pupils: Pupils are equal, round, and reactive to light.  Cardiovascular:     Rate and Rhythm: Normal rate.  Rhythm irregular.     Heart sounds: Murmur heard.     Comments: 3/6 systolic murmur along the left sternal border.  2/6 diastolic  murmur at the apex. Pulmonary:     Comments: Decreased breath sounds throughout.  Pursed lip breathing when carrying on a conversation. Abdominal:     General: Abdomen is flat. Bowel sounds are normal.     Palpations: Abdomen is soft.     Tenderness: There is no abdominal tenderness.  Musculoskeletal:        General: Swelling present.  Skin:    General: Skin is warm and dry.     Comments: Chronic skin changes of venous stasis in the lower extremities.  Neurological:     General: No focal deficit present.     Mental Status: He is alert and oriented to person, place, and time.  Psychiatric:        Mood and Affect: Mood normal.        Behavior: Behavior normal.      ECHOCARDIOGRAM REPORT         Patient Name:   Joshua [name] Date of Exam: 10/05/2020  Medical Rec #:  [id number]     Height:       67.0 in  Accession #:    [id number]    Weight:       176.0 lb  Date of Birth:  Jan 28, 1946      BSA:          1.915 m  Patient Age:    74 years      BP:           124/85 mmHg  Patient Gender: M             HR:           48 bpm.  Exam Location:  [facility]   Procedure: 2D Echo, Cardiac Doppler, Color Doppler and Intracardiac             Opacification Agent   Indications:    I42.1 HOCM     History:        Patient has prior history of Echocardiogram examinations,  most                  recent 10/08/2019. Risk Factors:Hypertension and  Dyslipidemia.                  COPD. Coronary artery disease. Paroxysmal atrial  fibrillation.                  HOCM.     Sonographer:    Cresenciano Lick RDCS  Referring Phys: [id number] [place]     1. Left ventricular ejection fraction, by estimation, is 65 to 70%. The  left ventricle has normal function. The left ventricle has no regional  wall motion abnormalities. There is moderate asymmetric left ventricular  hypertrophy of the basal-septal  segment (15 mm). No dynamic LVOT obstruction demonstrated on this exam.   Left ventricular diastolic parameters are indeterminate.   2. Right ventricular systolic function is mildly reduced. The right  ventricular size is mildly enlarged. There is severely elevated pulmonary  artery systolic pressure. The estimated right ventricular systolic  pressure is 76.5 mmHg.   3. Left atrial size was mild to moderately dilated.   4. The mitral valve is degenerative. Moderate mitral valve regurgitation.  Moderate to severe mitral stenosis. The mean mitral valve gradient is 7.0  mmHg with average  heart rate of 55 bpm. Severe mitral annular  calcification.   5. Tricuspid valve regurgitation is moderate to severe.   6. The aortic valve is abnormal. There is mild calcification of the  aortic valve. Aortic valve regurgitation is severe. Mild aortic valve  stenosis. Aortic valve mean gradient measures 13.4 mmHg.   7. Pulmonic valve regurgitation is moderate.   8. The inferior vena cava is normal in size with <50% respiratory  variability, suggesting right atrial pressure of 8 mmHg.   FINDINGS   Left Ventricle: Left ventricular ejection fraction, by estimation, is 65  to 70%. The left ventricle has normal function. The left ventricle has no  regional wall motion abnormalities. Definity contrast agent was given IV  to delineate the left ventricular   endocardial borders. The left ventricular internal cavity size was normal  in size. There is moderate asymmetric left ventricular hypertrophy of the  basal-septal segment. Left ventricular diastolic parameters are  indeterminate.   Right Ventricle: The right ventricular size is mildly enlarged. No  increase in right ventricular wall thickness. Right ventricular systolic  function is mildly reduced. There is severely elevated pulmonary artery  systolic pressure. The tricuspid  regurgitant velocity is 3.78 m/s, and with an assumed right atrial  pressure of 8 mmHg, the estimated right ventricular systolic pressure is  67.6 mmHg.    Left Atrium: Left atrial size was mild to moderately dilated.   Right Atrium: Right atrial size was normal in size.   Pericardium: There is no evidence of pericardial effusion.   Mitral Valve: The mitral valve is degenerative in appearance. Severe  mitral annular calcification. Moderate mitral valve regurgitation.  Moderate to severe mitral valve stenosis. MV peak gradient, 18.0 mmHg. The  mean mitral valve gradient is 7.0 mmHg with   average heart rate of 55 bpm.   Tricuspid Valve: The tricuspid valve is normal in structure. Tricuspid  valve regurgitation is moderate to severe. No evidence of tricuspid  stenosis.   Aortic Valve: The aortic valve is abnormal. There is mild calcification of  the aortic valve. Aortic valve regurgitation is severe. Aortic  regurgitation PHT measures 381 msec. Mild aortic stenosis is present.  Aortic valve mean gradient measures 13.4 mmHg.   Aortic valve peak gradient measures 23.8 mmHg. Aortic valve area, by VTI  measures 1.65 cm.   Pulmonic Valve: The pulmonic valve was grossly normal. Pulmonic valve  regurgitation is moderate. No evidence of pulmonic stenosis.   Aorta: Ascending aorta likely upper limit of normal for age when indexed  to BSA. The aortic root is normal in size and structure.   Venous: The inferior vena cava is normal in size with less than 50%  respiratory variability, suggesting right atrial pressure of 8 mmHg.   IAS/Shunts: No atrial level shunt detected by color flow Doppler.      LEFT VENTRICLE  PLAX 2D  LVIDd:         4.80 cm  Diastology  LVIDs:         3.10 cm  LV e' medial:    6.17 cm/s  LV PW:         1.15 cm  LV E/e' medial:  32.9  LV IVS:        1.50 cm  LV e' lateral:   9.30 cm/s  LVOT diam:     1.90 cm  LV E/e' lateral: 21.8  LV SV:         89  LV SV Index:  46  LVOT Area:     2.84 cm      RIGHT VENTRICLE             IVC  RV Basal diam:  4.60 cm     IVC diam: 1.50 cm  RV S prime:     10.31 cm/s   TAPSE (M-mode): 1.8 cm   LEFT ATRIUM             Index       RIGHT ATRIUM           Index  LA diam:        4.90 cm 2.56 cm/m  RA Area:     19.40 cm  LA Vol (A2C):   71.7 ml 37.44 ml/m RA Volume:   59.70 ml  31.17 ml/m  LA Vol (A4C):   74.5 ml 38.90 ml/m  LA Biplane Vol: 75.3 ml 39.31 ml/m   AORTIC VALVE  AV Area (Vmax):    1.52 cm  AV Area (Vmean):   1.51 cm  AV Area (VTI):     1.65 cm  AV Vmax:           243.80 cm/s  AV Vmean:          166.800 cm/s  AV VTI:            0.539 m  AV Peak Grad:      23.8 mmHg  AV Mean Grad:      13.4 mmHg  LVOT Vmax:         131.00 cm/s  LVOT Vmean:        88.900 cm/s  LVOT VTI:          0.314 m  LVOT/AV VTI ratio: 0.58  AI PHT:            381 msec     AORTA  Ao Root diam: 3.20 cm  Ao Asc diam:  3.90 cm   MITRAL VALVE                TRICUSPID VALVE  MV Area (PHT): 2.20 cm     TR Peak grad:   57.2 mmHg  MV Area VTI:   1.71 cm     TR Vmax:        378.00 cm/s  MV Peak grad:  18.0 mmHg  MV Mean grad:  7.0 mmHg     SHUNTS  MV Vmax:       2.12 m/s     Systemic VTI:  0.31 m  MV Vmean:      123.0 cm/s   Systemic Diam: 1.90 cm  MV E velocity: 203.00 cm/s   Cherlynn Kaiser MD  Electronically signed by Cherlynn Kaiser MD  Signature Date/Time: 10/05/2020/10:22:47 PM      Assessment/Plan:  This 75 year old gentleman has moderate to severe aortic insufficiency with mild aortic stenosis as well as moderate to severe mitral stenosis and insufficiency and moderate to severe tricuspid regurgitation.  His echocardiogram shows severe pulmonary hypertension with right ventricular dysfunction.  He presented with acute on chronic diastolic congestive heart failure.  Given his age, chronic debilitation and comorbidities, particularly oxygen dependent lung disease with chronic hypoxemic respiratory failure, I do not think he would be a candidate for open surgical aortic and mitral valve replacements with tricuspid valve repair.  He only has mildly calcified  aortic valve leaflets with good leaflet mobility and mild stenosis which I do not think is enough to allow placement of a TAVR valve.  In addition, with his lung disease, severe mitral valve disease and severe pulmonary hypertension I do not think replacing his aortic valve is going to improve his situation.  I think the best option for this patient would be continued medical therapy and a palliative care consult. I discussed all of this with him and he seems to understand.  Joshua [name] 11/26/2020, 3:30 PM

## 2020-11-27 DIAGNOSIS — Z7189 Other specified counseling: Secondary | ICD-10-CM

## 2020-11-27 DIAGNOSIS — Z515 Encounter for palliative care: Secondary | ICD-10-CM

## 2020-11-27 NOTE — Progress Notes (Addendum)
Progress Note  Patient Name: Joshua [name] Date of Encounter: 11/27/2020  [facility] Cardiologist: Shelva Majestic, MD   Subjective   No SOB or chest pain  Inpatient Medications    Scheduled Meds:  apixaban  5 mg Oral BID   dofetilide  250 mcg Oral BID   pantoprazole  40 mg Oral Daily   rosuvastatin  10 mg Oral Daily   sodium chloride flush  3 mL Intravenous Q12H   tamsulosin  0.4 mg Oral Daily   Continuous Infusions:  sodium chloride     PRN Meds: sodium chloride, acetaminophen, benzonatate, ipratropium, polyethylene glycol, sodium chloride flush   Vital Signs    Vitals:   11/26/20 2037 11/27/20 0440 11/27/20 0446 11/27/20 0815  BP: (!) 122/51 (!) 101/49 (!) 101/49 (!) 123/52  Pulse: 67 67 67 70  Resp: 18     Temp: 97.7 F (36.5 C) 97.8 F (36.6 C) 97.8 F (36.6 C) 97.7 F (36.5 C)  TempSrc: Oral Oral Oral Oral  SpO2: 93% 96% 96% 97%  Weight:   75.6 kg   Height:        Intake/Output Summary (Last 24 hours) at 11/27/2020 0946 Last data filed at 11/27/2020 6761 Gross per 24 hour  Intake 240 ml  Output 220 ml  Net 20 ml    Last 3 Weights 11/27/2020 11/26/2020 11/25/2020  Weight (lbs) 166 lb 10.7 oz 166 lb 4.8 oz 169 lb 12.1 oz  Weight (kg) 75.6 kg 75.433 kg 77 kg      Telemetry  Atrial flutter with variable block and CVR- Personally Reviewed  ECG    No new tracing this morning  Physical Exam   GEN: Well nourished, well developed in no acute distress HEENT: Normal NECK: No JVD; No carotid bruits LYMPHATICS: No lymphadenopathy CARDIAC:irregularly irregular, no  rubs, gallops.  2/6 diastolic murmur at LUSB RESPIRATORY:  Clear to auscultation without rales, wheezing or rhonchi  ABDOMEN: Soft, non-tender, non-distended MUSCULOSKELETAL:  No edema; No deformity  SKIN: Warm and dry NEUROLOGIC:  Alert and oriented x 3 PSYCHIATRIC:  Normal affect   Labs    High Sensitivity Troponin:   Recent Labs  Lab 11/07/20 1051 11/07/20 1254  TROPONINIHS 48*  47*      Chemistry Recent Labs  Lab 11/21/20 1602 11/22/20 0516 11/23/20 0305 11/24/20 0353 11/25/20 0314 11/26/20 0309  NA 139 139 135 135 137 136  K 4.3 4.1 4.6 5.0 4.4 4.4  CL 91* 90* 89* 87* 92* 93*  CO2 43* 45* 42* 44* 39* 39*  GLUCOSE 97 87 73 101* 104* 94  BUN 39* 34* 31* 29* 26* 25*  CREATININE 1.05 1.15 1.07 1.45* 1.05 1.20  CALCIUM 8.4* 8.7* 8.3* 8.6* 8.6* 8.5*  MG  --  2.5* 2.4 2.7*  --   --   PROT 6.3*  --   --   --   --   --   ALBUMIN 3.0*  --   --   --   --   --   AST 27  --   --   --   --   --   ALT 10  --   --   --   --   --   ALKPHOS 40  --   --   --   --   --   BILITOT 1.2  --   --   --   --   --   GFRNONAA >60 >60 >60 50* >60 >60  ANIONGAP 5 4* 4* 4* 6 4*     Lipids No results for input(s): CHOL, TRIG, HDL, LABVLDL, LDLCALC, CHOLHDL in the last 168 hours.  Hematology Recent Labs  Lab 11/21/20 1602  WBC 6.8  RBC 4.07*  HGB 13.3  HCT 43.6  MCV 107.1*  MCH 32.7  MCHC 30.5  RDW 15.6*  PLT 126*    Thyroid No results for input(s): TSH, FREET4 in the last 168 hours.  BNP Recent Labs  Lab 11/21/20 1602  BNP 391.0*     DDimer No results for input(s): DDIMER in the last 168 hours.   Radiology    No results found.  Cardiac Studies   N/a   Patient Profile     74 y.o. male with hx of HFpEF, CAD s/p prior PCI to LAD + RCA, moderate MR/MS, persistent atrial fibrillation/flutter, essential hypertension, COPD on home O2, who is being seen 11/21/2020 for the evaluation of acute on chronic heart failure with preserved ejection fraction.  Assessment & Plan    Acute on Chronic Diastolic HF: in the setting of atrial flutter with valvular disease. BNP 391, CXR small bilateral effusions , 4.8 x 3.2 cm opacity over the left heart with recommendations for follow up CXR. Extremities are warm and dry. Lactic acid 1.3.  -- given Diamox 248m x2, weight are stable -- I&O's are incomplete -- weight down 2kg from admit and no change since yesterday -- SCr  bumped slightly from 1.05>1.2 yesterday and pending this am -- diuretics on hold for now -- started on jardiance on admission but held with low BPs. Plan to resume at discharge    Valvular disease: Echo 8/22 with mean mitral valve gradient is 7.0 mmHg. Progressive disease over the past couple of years. Moderate to severe TR, along with severe aortic regurgitation, mild AS and significant MAC with moderately severe MS and moderate MR. Attempted TEE 9/30 but was unable to consent 2/2 confusion.  -- No dynamic obstruction on last echo but HOCM in past -- cMRI showed severe ASSH c/w HOCM and patchy LGE with mild LVE and normal LVF, mild RVE and normal RVF, moderate to severe AR with RF 32%, moderate to severe TR (RF 38%) and moderate MR (RF 35%) and bilateral LL airspace disease.   -- appreciated CVTS consult>>deemed not a candidate for SAVR for mitral valve or TV disease and feel that replacing his AV for severe AR will not improve his situation given his underlying severe PHTN and severe MV disease --recommend Palliative care consult  Persistent Atrial fib/flutter:  -- Started on Tikosyn 12/2019, last seen in the Afib clinic 3/22 for follow up with plans for DCCV but noted in SR.  -- Did have 2 separate episodes of bradycardia, rates in the 30s. Tikosyn was reduced from 501m BID to 25070mBID.  -- now back in atrial flutter with variable block and CVR>>it may be hard to maintain NSR with his underlying valvular heart disease -- continue On eliquis. -- EKG 9/30 QT 452m73mCAD: s/p stenting to LAD and RCA '02 &'03. No chest pain PTA. hsTn 48>>47 in the setting of CHF with valvular disease -- no anginal symptoms -- continue statin -- no ASA due to DOAC  HLD:  -- LDL goal < 70 -- LDL was 58 in April 2022 -- continue on crestor 10mg8mly   Chronic respiratory failure/COPD: uses 3L [place] all the time at home. Has not had to increase PTA. CO2 noted at 45 ye59erday, repeat  of 43 today. Review of  labs note range of upper 30s to low 40s historically in the setting of his COPD.   HTN:  --BP controlled at 123/56mHg --holding losartan and spiro.    Thrombocytopenia:  -- platelet count 90-100s on review of labs a -- No reports of bleeding PTA. Folate and B12 levels ok   I have spent a total of 35 minutes with patient reviewing cMRI, CVTS consult, 2D echo , telemetry, EKGs, labs and examining patient as well as establishing an assessment and plan that was discussed with the patient.  > 50% of time was spent in direct patient care.     For questions or updates, please contact [place] consult [email or web address] for contact info under        Signed, TFransico Him MD  11/27/2020, 9:46 AM

## 2020-11-27 NOTE — Consult Note (Addendum)
Consultation Note Date: 11/27/2020   Patient Name: Joshua [name]  DOB: 1945/06/26  MRN: [id number]  Age / Sex: 75 y.o., male  PCP: [name], [name], MD Referring Physician: Troy Sine, MD  Reason for Consultation: Establishing goals of care "end stage valvular heart disease - not surgical candidate"  HPI/Patient Profile: 75 y.o. male  with past medical history of HFpEF, CAD s/p prior PCI, moderate mitral regurgitation/mitral stenosis, COPD, chronic respiratory failure on 3L home O2, persistent atrial fib/flutter who presented to [facility] emergency department on 11/21/2020 with complaint of bilateral leg swelling. Chest x-ray reveals cardiomegaly with diffuse bilateral interstitial pulmonary opacities with small bilateral pleural effusions. BNP 391. Admitted to cardiology service with acute on chronic diastolic CHF.  He also has severe pulmonary HTN, moderate to severe aortic insufficiency with mild aortic stenosis, moderate to severe mitral stenosis and insufficiency, and moderate to severe tricuspid regurgitation.  CTS consulted and has deemed he is not a good surgical candidate.   Clinical Assessment and Goals of Care: I have reviewed medical records including EPIC notes, labs and imaging, and met at bedside with patient  to discuss diagnosis, prognosis, GOC, EOL wishes, disposition, and options.  I introduced Palliative Medicine as specialized medical care for people living with serious illness. It focuses on providing relief from the symptoms and stress of a serious illness.   We discussed a brief life review of the patient. He is originally from the [place] area. He is retired from a long career in Event organiser. He has 4 (step) children from his 2nd wife. Daughters Carmen and Chester live in [place]. Son Daneen Schick also lives somewhere in [place]. Daughter Cheri Rous lives in [place].  Patient's wife passed away in 2008-05-14.   Patient lives in a private residence in [place]. His daughter Asencion Partridge lives with him. Carina lives very close by. As far as functional and status, patient is ambulatory and independent with his ADLs.   We discussed his current illness and what it means in the larger context of his ongoing co-morbidities. Patient understands he has advanced valvular disease  of the aortic, mitral, and tricuspid valves. He states CTS told him that surgery would not help him or improve his condition - he agrees with this recommendation. Natural disease trajectory of chronic/serious illness was discussed.  I attempted to elicit values and goals of care important to the patient. He wants "to live his life until he dies". He wants to remain in his home as long as possible. He indicates he would not want extraordinary or life-prolonging measures.    We did discuss code status. Encouraged patient to consider DNR/DNI status understanding evidenced based poor outcomes in similar hospitalized patients, as the cause of the arrest is likely associated with chronic/terminal disease rather than a reversible acute cardio-pulmonary event. Explained that it is a protective measure to keep [place] from harming the patient in their last moments of life. Patient indicates that he would not want CPR, defibrillation, or intubation and agrees that DNR is appropriate.  Advanced directives, concepts artifical feeding and hydration, were considered and discussed. We completed an HCPOA document together. He names his son Daneen Schick as his primary health care agent, and his daughter Magdalene Molly as his alternate health care agent.  Living will was also completed. He states his desire not to receive life-prolonging measures in the following situations: He has condition that is incurable and will result in death within a short period of time He is unconscious and her medical team is confident she cannot regain  consciousness He has advanced dementia or other substantial and irreversible loss of mental function   Hospice and Palliative Care services outpatient were explained and offered. Explained that in the event his health further declines, that hospice care would support his goal of remaining at home. Patient seems to indicate he would be open to hospice services in the future.   19:40 - I spoke with patient's daughter Magdalene Molly by phone. I summarized to her my discussion with her father. Including his decision for DNR status. She is tearful but supportive. She is agreeable for a referral to outpatient palliative services with [facility].  Questions and concerns were addressed.  The family was encouraged to call with questions or concerns.   Primary decision maker: patient    SUMMARY OF RECOMMENDATIONS   Code status changed to DNR/DNI - durable form signed and placed on chart Continue current interventions Goal of care is to return home and "live his life" Advanced directive packet filled out - needs to be notarized Outpatient palliative referral to [facility] Planning: DNR  Additional Recommendations (Limitations, Scope, Preferences): No Artificial Feeding  Psycho-social/Spiritual:  Created space and opportunity for patient and family to express thoughts and feelings regarding patient's current medical situation.  Emotional support provided  Prognosis:  Unable to determine  Discharge Planning: To Be Determined      Primary Diagnoses: Present on Admission:  Acute on chronic diastolic (congestive) heart failure ([place])   I have reviewed the medical record, interviewed the patient and family, and examined the patient. The following aspects are pertinent.  Past Medical History:  Diagnosis Date   Acute lower GI bleeding    related to gastritis / viral infection   Cataract    Cirrhosis ([place])    Congestive heart failure (CHF) ([place])  11/21/2020   COPD (chronic obstructive pulmonary disease) ([place])    PFT 05-05-2010, FEV1 .9 (26%) ratio 60   Coronary artery disease    Gastritis 06-10-2006   Hiatal hernia 06-10-2006   EGD   HOH (hard of hearing)    Hx of adenomatous colonic polyps 2005, 2013   Colonoscopy   Hypercholesteremia    Hypertension    Hypertr obst cardiomyop    mild subaortic stenosis   Iron deficiency anemia, unspecified    LVH (left ventricular hypertrophy)    Other B-complex deficiencies    Persistent atrial fibrillation (HCC)    persistent   Rheumatic fever    Thrombocytopenia ([place]) 05/2008     Family History  Problem Relation Age of Onset   Heart attack Brother 29       Ventircular rupture   Emphysema Brother    Lung cancer Brother    Heart disease Mother        Enlarged heart   Stroke Mother    Hyperlipidemia Father    CAD Father    Hypertension Sister    CAD Paternal Uncle    Stroke Paternal Uncle  Cancer Maternal Grandfather        colon   Colon cancer Maternal Grandfather    Asthma Brother    Scheduled Meds:  apixaban  5 mg Oral BID   dofetilide  250 mcg Oral BID   pantoprazole  40 mg Oral Daily   rosuvastatin  10 mg Oral Daily   sodium chloride flush  3 mL Intravenous Q12H   tamsulosin  0.4 mg Oral Daily   Continuous Infusions:  sodium chloride     PRN Meds:.sodium chloride, acetaminophen, benzonatate, ipratropium, polyethylene glycol, sodium chloride flush   No Known Allergies Review of Systems  Respiratory:  Positive for shortness of breath.    Physical Exam Vitals reviewed.  Constitutional:      General: He is not in acute distress.    Comments: Chronically ill-appearing  Cardiovascular:     Rate and Rhythm: Normal rate.  Pulmonary:     Effort: Pulmonary effort is normal.  Musculoskeletal:     Right lower leg: Edema present.     Left lower leg: Edema present.  Neurological:     Mental Status: He is alert and oriented to person, place, and time.    Vital  Signs: BP 120/60 (BP Location: Right Arm)   Pulse 69   Temp 97.7 F (36.5 C) (Oral)   Resp 18   Ht 5' 9"  (1.753 m)   Wt 75.6 kg   SpO2 90%   BMI 24.61 kg/m  Pain Scale: 0-10   Pain Score: 0-No pain   SpO2: SpO2: 90 % O2 Device:SpO2: 90 % O2 Flow Rate: .O2 Flow Rate (L/min): 3 L/min  IO: Intake/output summary:  Intake/Output Summary (Last 24 hours) at 11/27/2020 1323 Last data filed at 11/27/2020 3729 Gross per 24 hour  Intake 240 ml  Output 220 ml  Net 20 ml    LBM: Last BM Date: 11/24/20 Baseline Weight: Weight: 77.3 kg Most recent weight: Weight: 75.6 kg      Palliative Assessment/Data: PPS 60%     Time In: 1840 Time Out: 2025 Time Total: 105 minutes Greater than 50%  of this time was spent counseling and coordinating care related to the above assessment and plan.  Signed by: Lavena Bullion, NP   Please contact Palliative Medicine Team phone at [phone] for questions and concerns.  For individual provider: See Shea Evans

## 2020-11-28 DIAGNOSIS — I38 Endocarditis, valve unspecified: Secondary | ICD-10-CM

## 2020-11-28 LAB — BASIC METABOLIC PANEL
Anion gap: 6 (ref 5–15)
BUN: 25 mg/dL — ABNORMAL HIGH (ref 8–23)
CO2: 41 mmol/L — ABNORMAL HIGH (ref 22–32)
Calcium: 8.7 mg/dL — ABNORMAL LOW (ref 8.9–10.3)
Chloride: 94 mmol/L — ABNORMAL LOW (ref 98–111)
Creatinine, Ser: 0.97 mg/dL (ref 0.61–1.24)
GFR, Estimated: >60 mL/min (ref >60)
Glucose, Bld: 154 mg/dL — ABNORMAL HIGH (ref 70–99)
Potassium: 5 mmol/L (ref 3.5–5.1)
Sodium: 141 mmol/L (ref 135–145)

## 2020-11-28 NOTE — Plan of Care (Signed)
  Problem: Activity: Goal: Risk for activity intolerance will decrease Outcome: Progressing   Problem: Elimination: Goal: Will not experience complications related to urinary retention Outcome: Progressing   Problem: Safety: Goal: Ability to remain free from injury will improve Outcome: Progressing   

## 2020-11-28 NOTE — Progress Notes (Addendum)
Mobility Specialist Progress Note   11/28/20 1200  Mobility  Activity Ambulated to bathroom  Level of Assistance Minimal assist, patient does 75% or more  Assistive Device None  Mobility Out of bed for toileting  Mobility Response Tolerated fair  Mobility performed by Mobility specialist  $Mobility charge 1 Mobility   Arrived to pt exiting bed w/ bed alarm on. Advised pt to use call bell when needing to exit and proceeded to assist them to BR. After pt had a successful BM I assisted them back to bed with bed alarm on and call bell by side.   Holland Falling Mobility Specialist Phone Number [phone]

## 2020-11-28 NOTE — Progress Notes (Addendum)
Progress Note  Patient Name: Joshua [name] Date of Encounter: 11/28/2020  [facility] Cardiologist: Shelva Majestic, MD   Subjective   Feeling well. No chest pain, sob or palpitations.    Inpatient Medications    Scheduled Meds:  apixaban  5 mg Oral BID   dofetilide  250 mcg Oral BID   pantoprazole  40 mg Oral Daily   rosuvastatin  10 mg Oral Daily   sodium chloride flush  3 mL Intravenous Q12H   tamsulosin  0.4 mg Oral Daily   Continuous Infusions:  sodium chloride     PRN Meds: sodium chloride, acetaminophen, benzonatate, ipratropium, polyethylene glycol, sodium chloride flush   Vital Signs    Vitals:   11/27/20 2047 11/28/20 0536 11/28/20 0740 11/28/20 0927  BP: (!) 125/52 (!) 135/47 (!) 109/52 (!) 137/54  Pulse: 66 69 71 69  Resp: _0 Temp: 97.8 F (36.6 C) 98.3 F (36.8 C) 97.8 F (36.6 C)   TempSrc: Oral Oral Oral   SpO2: 93% 92% (!) 89% 95%  Weight:  76.1 kg    Height:        Intake/Output Summary (Last 24 hours) at 11/28/2020 1122 Last data filed at 11/28/2020 0848 Gross per 24 hour  Intake 720 ml  Output 200 ml  Net 520 ml   Last 3 Weights 11/28/2020 11/27/2020 11/26/2020  Weight (lbs) 167 lb 12.3 oz 166 lb 10.7 oz 166 lb 4.8 oz  Weight (kg) 76.1 kg 75.6 kg 75.433 kg      Telemetry    Atrial flutter - Personally Reviewed  ECG    N/A  Physical Exam   GEN: No acute distress.   Neck: No JVD Cardiac: Ir IR, + murmurs, rubs, or gallops.  Respiratory: Clear to auscultation bilaterally. GI: Soft, nontender, non-distended  MS: No edema; No deformity. Neuro:  Nonfocal  Psych: Normal affect   Labs    High Sensitivity Troponin:   Recent Labs  Lab 11/07/20 1051 11/07/20 1254  TROPONINIHS 48* 47*     Chemistry Recent Labs  Lab 11/21/20 1602 11/22/20 0516 11/23/20 0305 11/24/20 0353 11/25/20 0314 11/26/20 0309  NA 139 139 135 135 137 136  K 4.3 4.1 4.6 5.0 4.4 4.4  CL 91* 90* 89* 87* 92* 93*  CO2 43* 45* 42* 44* 39* 39*   GLUCOSE 97 87 73 101* 104* 94  BUN 39* 34* 31* 29* 26* 25*  CREATININE 1.05 1.15 1.07 1.45* 1.05 1.20  CALCIUM 8.4* 8.7* 8.3* 8.6* 8.6* 8.5*  MG  --  2.5* 2.4 2.7*  --   --   PROT 6.3*  --   --   --   --   --   ALBUMIN 3.0*  --   --   --   --   --   AST 27  --   --   --   --   --   ALT 10  --   --   --   --   --   ALKPHOS 40  --   --   --   --   --   BILITOT 1.2  --   --   --   --   --   GFRNONAA >60 >60 >60 50* >60 >60  ANIONGAP 5 4* 4* 4* 6 4*    Lipids No results for input(s): CHOL, TRIG, HDL, LABVLDL, LDLCALC, CHOLHDL in the last 168 hours.  Hematology Recent Labs  Lab 11/21/20 [phone]  WBC 6.8  RBC 4.07*  HGB 13.3  HCT 43.6  MCV 107.1*  MCH 32.7  MCHC 30.5  RDW 15.6*  PLT 126*   Thyroid No results for input(s): TSH, FREET4 in the last 168 hours.  BNP Recent Labs  Lab 11/21/20 1602  BNP 391.0*    DDimer No results for input(s): DDIMER in the last 168 hours.   Radiology    No results found.  Cardiac Studies   None this admission   Patient Profile     75 y.o. male with hx of HFpEF, CAD s/p prior PCI to LAD + RCA, moderate MR/MS, persistent atrial fibrillation/flutter, essential hypertension, COPD on home O2, who is admitted for the evaluation of acute on chronic heart failure with preserved ejection fraction.  Assessment & Plan    Acute on Chronic Diastolic HF: in the setting of atrial flutter with valvular disease. BNP 391, CXR small bilateral effusions , 4.8 x 3.2 cm opacity over the left heart with recommendations for follow up CXR.  -- given Diamox 267m x2 last week, weight are stable -- I&O's are incomplete -- SCr bumped slightly from 1.05>1.2  2 days ago. Will recheck today  -- diuretics on hold for now >>> may need PRN at discharge  -- started on jardiance on admission but held with bump in creatinine >> now, being on palliative care, would not resume  - Held Losartan and spironolactone    Valvular disease: Echo 8/22 with mean mitral valve  gradient is 7.0 mmHg. Progressive disease over the past couple of years. Moderate to severe TR, along with severe aortic regurgitation, mild AS and significant MAC with moderately severe MS and moderate MR. Attempted TEE 9/30 but was unable to consent 2/2 confusion.  -- No dynamic obstruction on last echo but HOCM in past -- cMRI showed severe ASSH c/w HOCM and patchy LGE with mild LVE and normal LVF, mild RVE and normal RVF, moderate to severe AR with RF 32%, moderate to severe TR (RF 38%) and moderate MR (RF 35%) and bilateral LL airspace disease.   -- appreciated CVTS consult>>deemed not a candidate for SAVR for mitral valve or TV disease and feel that replacing his AV for severe AR will not improve his situation given his underlying severe PHTN and severe MV disease -seen by Palliative care >> now DNR. Plan of care to return home. Will get PT   Persistent Atrial fib/flutter:  -- Started on Tikosyn 12/2019, last seen in the Afib clinic 3/22 for follow up with plans for DCCV but noted in SR.  -- Did have 2 separate episodes of bradycardia, rates in the 30s. Tikosyn was reduced from 5058m BID to 25082mBID.  -- now back in atrial flutter with variable block and CVR>>it may be hard to maintain NSR with his underlying valvular heart disease -- continue On eliquis. -- EKG 9/30 QT 452m74mCAD: s/p stenting to LAD and RCA '02 &'03. No chest pain PTA. hsTn 48>>47 in the setting of CHF with valvular disease -- no anginal symptoms -- continue statin -- no ASA due to DOAC   HLD:  -- LDL goal < 70 -- LDL was 58 in April 2022 -- continue on crestor 10mg31mly   Chronic respiratory failure/COPD: uses 3L [place] all the time at home. Here on 4 L oxygen.    HTN:  --BP controlled --holding losartan and spiro.    Will get PT. Likely DC soon on current meds with PRN diuretics.  For questions or updates, please contact [place] Please consult [email or web address] for contact info under         SignedLeanor Kail, PA  11/28/2020, 11:22 AM

## 2020-11-28 NOTE — Evaluation (Signed)
Physical Therapy Evaluation Patient Details Name: Joshua [name] MRN: [id number] DOB: Mar 04, 1945 Today's Date: 11/28/2020  History of Present Illness  Patient is a 75 y/o male who presents to [facility] on 11/21/20 with BLE swelling. Found to have acute on chronic CHF and severe end stage valvular disease. PMH includes PAF, LBBB, CHF, COPD, CAD, HTN,  Clinical Impression  Patient presents with decreased cardiovascular endurance, dyspnea on exertion and impaired mobility s/p above. Pt tolerated bed mobility, transfers and gait training with supervision-Min guard and use of RW for support. Noted to have 2/4 DOE with SP02 remaining >90% on 4L/min 02 Joshua [name] with exertion. Sp02 ranging from 87-88% on 4L sitting EOB prior to activity. Discussed importance of using RW at home for energy conservation and safety until strength improves. Pt agreeable. Pt needs to be able to walk to garage which is not attached to the house and has gravel. Will follow acutely to maximize independence and mobility and balance prior to return home.       Recommendations for follow up therapy are one component of a multi-disciplinary discharge planning process, led by the attending physician.  Recommendations may be updated based on patient status, additional functional criteria and insurance authorization.  Follow Up Recommendations No PT follow up;Supervision - Intermittent    Equipment Recommendations  None recommended by PT    Recommendations for Other Services       Precautions / Restrictions Precautions Precautions: Fall Restrictions Weight Bearing Restrictions: No      Mobility  Bed Mobility Overal bed mobility: Needs Assistance Bed Mobility: Rolling;Sidelying to Sit;Sit to Supine Rolling: Modified independent (Device/Increase time) Sidelying to sit: Modified independent (Device/Increase time);HOB elevated   Sit to supine: Modified independent (Device/Increase time);HOB elevated   General bed mobility  comments: No assist needed, performed x2 with heavy use of rail for support.    Transfers Overall transfer level: Needs assistance Equipment used: Rolling walker (2 wheeled) Transfers: Sit to/from Stand Sit to Stand: Min guard         General transfer comment: Min guard for safety. Stood from [employer], cues for hand placement with RW present.  Ambulation/Gait Ambulation/Gait assistance: Supervision Gait Distance (Feet): 400 Feet Assistive device: Rolling walker (2 wheeled) Gait Pattern/deviations: Step-through pattern;Decreased stride length   Gait velocity interpretation: <1.31 ft/sec, indicative of household ambulator General Gait Details: Slow, steady gait with RW for support; 2/4 DOE. SP02 remained >90% on 4L/min 02 Joshua [name]. Initially Sp02 87-88% sitting EOB on 4L but improved with mobility.  Stairs            Wheelchair Mobility    Modified Rankin (Stroke Patients Only)       Balance Overall balance assessment: Mild deficits observed, not formally tested                                           Pertinent Vitals/Pain Pain Assessment: No/denies pain    Home Living Family/patient expects to be discharged to:: Private residence Living Arrangements: Children (Joshua [name]) Available Help at Discharge: Family;Available PRN/intermittently Type of Home: Mobile home Home Access: Level entry     Home Layout: One level Home Equipment: Clinical cytogeneticist - 2 wheels      Prior Function Level of Independence: Independent         Comments: Does ADLs/IADLs. Wears 3L 02 Joshua [name]. no falls. Cooks. Joshua [name] is not much help per visitor  in room. Drives.     Hand Dominance   Dominant Hand: Right    Extremity/Trunk Assessment   Upper Extremity Assessment Upper Extremity Assessment: Defer to OT evaluation    Lower Extremity Assessment Lower Extremity Assessment: Generalized weakness (but functional)    Cervical / Trunk Assessment Cervical / Trunk  Assessment: Normal  Communication   Communication: HOH  Cognition Arousal/Alertness: Awake/alert Behavior During Therapy: WFL for tasks assessed/performed Overall Cognitive Status: Within Functional Limits for tasks assessed                                 General Comments: for basic mobility tasks however friend in room says he is not baseline.      General Comments General comments (skin integrity, edema, etc.): Friend present during session.    Exercises     Assessment/Plan    PT Assessment Patient needs continued PT services  PT Problem List Decreased strength;Decreased mobility;Cardiopulmonary status limiting activity;Decreased activity tolerance       PT Treatment Interventions Therapeutic exercise;Gait training;Patient/family education;Therapeutic activities;Functional mobility training;Balance training    PT Goals (Current goals can be found in the Care Plan section)  Acute Rehab PT Goals Patient Stated Goal: to go home PT Goal Formulation: With patient Time For Goal Achievement: 12/12/20 Potential to Achieve Goals: Good    Frequency Min 3X/week   Barriers to discharge Decreased caregiver support      Co-evaluation               AM-PAC PT "6 Clicks" Mobility  Outcome Measure Help needed turning from your back to your side while in a flat bed without using bedrails?: A Little Help needed moving from lying on your back to sitting on the side of a flat bed without using bedrails?: A Little Help needed moving to and from a bed to a chair (including a wheelchair)?: A Little Help needed standing up from a chair using your arms (e.g., wheelchair or bedside chair)?: A Little Help needed to walk in hospital room?: None Help needed climbing 3-5 steps with a railing? : A Little 6 Click Score: 19    End of Session Equipment Utilized During Treatment: Oxygen;Gait belt Activity Tolerance: Patient tolerated treatment well Patient left: in bed;with  call bell/phone within reach;with bed alarm set;with family/visitor present Nurse Communication: Mobility status PT Visit Diagnosis: Muscle weakness (generalized) (M62.81)    Time: 3500-9381 PT Time Calculation (min) (ACUTE ONLY): 42 min   Charges:   PT Evaluation $PT Eval Moderate Complexity: 1 Mod PT Treatments $Gait Training: 8-22 mins $Therapeutic Activity: 8-22 mins        Marisa Severin, PT, DPT Acute Rehabilitation Services Pager [phone] Office [phone]     Marguarite Arbour A Sabra Heck 11/28/2020, 3:21 PM

## 2020-11-28 NOTE — Progress Notes (Signed)
MD notified of 7 beat run of v tach per tele.  Vital signs stable as charted.

## 2020-11-29 ENCOUNTER — Other Ambulatory Visit (HOSPITAL_COMMUNITY): Payer: Self-pay

## 2020-11-29 DIAGNOSIS — I350 Nonrheumatic aortic (valve) stenosis: Secondary | ICD-10-CM

## 2020-11-29 DIAGNOSIS — I071 Rheumatic tricuspid insufficiency: Secondary | ICD-10-CM

## 2020-11-29 DIAGNOSIS — I340 Nonrheumatic mitral (valve) insufficiency: Secondary | ICD-10-CM

## 2020-11-29 MED ORDER — DOFETILIDE 250 MCG PO CAPS
250.0000 ug | ORAL_CAPSULE | Freq: Two times a day (BID) | ORAL | 2 refills | Status: DC
  Filled 2020-11-29: qty 60, 30d supply, fill #0

## 2020-11-29 NOTE — Progress Notes (Signed)
Patient had walker delivered as well as an O2 tank as his was not charged. Taking patient to DC area via wheelchair and Discharged

## 2020-11-29 NOTE — Discharge Summary (Signed)
Discharge Summary    Patient ID: Joshua [name] MRN: [id number]; DOB: November 13, 1945  Admit date: 11/21/2020 Discharge date: 11/29/2020  PCP:  [name], Joshua Kaufmann, MD   [facility] Providers Cardiologist:  Joshua Majestic, MD  Electrophysiologist:  Joshua Grayer, MD    Discharge Diagnoses    Principal Problem:   Acute on chronic diastolic (congestive) heart failure [facility]) Active Problems:   COPD (chronic obstructive pulmonary disease) ([place])   Aortic valve insufficiency   Congestive heart failure (CHF) ([place])   Mitral regurgitation   Aortic stenosis   Tricuspid regurgitation  Diagnostic Studies/Procedures    Cardiac MRI: 11/25/20  IMPRESSION: 1. Asymmetric LV hypertrophy measuring 80m in basal septum (751min posterior wall), consistent with hypertrophic cardiomyopathy   2. Patchy late gadolinium enhancement at RV insertion sites and basal septum, consistent with HCM. LGE accounts for 12% of total myocardial mass   3.  Mild LV dilatation with normal systolic function (EF 6154%  4.  Mild RV dilatation with normal systolic function (EF 5098%  5. Moderate to severe aortic valve regurgitation with regurgitant fraction 32% (35% cutoff for severe AI in cardiac MRI)   6. Moderate to severe tricuspid valve regurgitation with regurgitant fraction 38% (40% cutoff for severe TR)   7. Moderate mitral valve regurgitation with regurgitant fraction 35% (40% cutoff for severe MR)   8.  Mild pulmonic valve regurgitation with regurgitant fraction 12%   9. Bilateral lower lobe airspace disease, consider CT chest for further evaluation     Electronically Signed   By: Joshua [name].D.   On: 11/25/2020 18:10 _____________   History of Present Illness     Joshua [name] a [age].o. male with with hx of HFpEF, CAD s/p prior PCI to LAD + RCA, moderate MR/MS, persistent atrial fibrillation/flutter, essential hypertension, COPD on home O2, who was seen 11/21/2020 for the evaluation  of acute  shortness of breath.  He was most recently admitted for a lower GI bleed (July). This was believed to be 2/2 internal hemorrhoids. He was continued on his eliquis for CVA prevention.    The evening of admission, Mr. UrMantioneeported he had been feeling overall more fatigued for the last week or so.  He notes a sensation of feeling "blah."  Over the last couple days  prior he had a dry cough.  He denied any associated fevers or chills.  Over the last couple days he is also noticed that his legs have been swelling quite a bit.  He has been taking his Lasix as instructed but his legs have continued to swell.  He felt a bit more short of breath although admittedly did not do much more at home other than "put my legs up and watch TV."  He denied any recent chest pain, chest pressure, or palpitations.     Hospital Course     Consultants: Palliative Care, TCTS  Acute on Chronic Diastolic HF: in the setting of atrial flutter with valvular disease. BNP 391, CXR small bilateral effusions , 4.8 x 3.2 cm opacity over the left heart with recommendations for follow up CXR. Given Diamox 25027m2, weight remains stable. I&O's were incomplete, as he has had trouble with using urinal.  -- SCr bumped slightly up to 1.45, but improved to 0.97 prior to discharge -- started on jardiance on admission but held with bump in creatinine >> now, being on palliative care, will not resume  -- Held Losartan and spironolactone given low  Bps   Valvular disease: Echo 8/22 with mean mitral valve gradient is 7.0 mmHg. Progressive disease over the past couple of years. Moderate to severe TR, along with severe aortic regurgitation, mild AS and significant MAC with moderately severe MS and moderate MR. Attempted TEE 9/30 but was unable to consent 2/2 confusion.  -- No dynamic obstruction on last echo but HOCM in past -- cMRI showed severe ASSH c/w HOCM and patchy LGE with mild LVE and normal LVF, mild RVE and normal RVF,  moderate to severe AR with RF 32%, moderate to severe TR (RF 38%) and moderate MR (RF 35%) and bilateral LL airspace disease.   -- seen by Dr. Chipper [name] not a candidate for SAVR for mitral valve or TV disease and feel that replacing his AV for severe AR will not improve his situation given his underlying severe PHTN and severe MV disease -- seen by Palliative care >> now DNR. PT evaluation during admission, no HH PT recommended at discharge. He lives at home with his daughter. Orders placed for [facility] RN and aide at discharge.    Persistent Atrial fib/flutter: Started on Tikosyn 12/2019, last seen in the Afib clinic 3/22 for follow up with plans for DCCV but noted in SR.  -- Did have 2 separate episodes of bradycardia, rates in the 30s. Tikosyn was reduced from 543mg BID to 2529m BID.  -- converted back into atrial flutter with variable block and CVR>>it will be hard to maintain NSR with his underlying valvular heart disease -- continue On eliquis, Tikosyn 25060mBID -- EKG 9/30 QT 452m28mCAD: s/p stenting to LAD and RCA '02 &'03. No chest pain PTA. hsTn 48>>47 in the setting of CHF with valvular disease -- no anginal symptoms -- continue statin -- no ASA due to DOAC   HLD: LDL was 58 in April 2022 -- continue on crestor 10mg67mly   Chronic respiratory failure/COPD: uses 3L [place] all the time at home. -- continue home meds at discharge    HTN: BP controlled --holding losartan and spiro.   General: Frail older male, chronic O2 _0 -4L  Head: Normocephalic, atraumatic.  Neck: Supple without bruits, JVD. Lungs:  Diminished in bases bilaterally Heart: Irreg, S1, S2, no S3, S4, 3/6 systolic murmur LLSB; no rub. Abdomen: Soft, non-tender, non-distended with normoactive bowel sounds.  Extremities: No clubbing, cyanosis, edema.  Neuro: Alert and oriented X 3. Moves all extremities spontaneously. Psych: Normal affect.  Patient was seen by Joshua [name] stable for discharge home.  Follow up in the office has been arranged. [facility]   Did the patient have an acute coronary syndrome (MI, NSTEMI, STEMI, etc) this admission?:  No                               Did the patient have a percutaneous coronary intervention (stent / angioplasty)?:  No.       _____________  Discharge Vitals Blood pressure (!) 123/54, pulse 66, temperature 98.3 F (36.8 C), temperature source Oral, resp. rate 18, height _1  (1.753 m), weight 76.1 kg, SpO2 97 %.  Filed Weights   11/27/20 0446 11/28/20 0536 11/29/20 0448  Weight: 75.6 kg 76.1 kg 76.1 kg    Labs & Radiologic Studies    CBC No results for input(s): WBC, NEUTROABS, HGB, HCT, MCV, PLT in the last 72 hours. Basic Metabolic Panel Recent Labs  11/28/20 1244  NA 141  K 5.0  CL 94*  CO2 41*  GLUCOSE 154*  BUN 25*  CREATININE 0.97  CALCIUM 8.7*   Liver Function Tests No results for input(s): AST, ALT, ALKPHOS, BILITOT, PROT, ALBUMIN in the last 72 hours. No results for input(s): LIPASE, AMYLASE in the last 72 hours. High Sensitivity Troponin:   Recent Labs  Lab 11/07/20 1051 11/07/20 1254  TROPONINIHS 48* 47*    BNP Invalid input(s): POCBNP D-Dimer No results for input(s): DDIMER in the last 72 hours. Hemoglobin A1C No results for input(s): HGBA1C in the last 72 hours. Fasting Lipid Panel No results for input(s): CHOL, HDL, LDLCALC, TRIG, CHOLHDL, LDLDIRECT in the last 72 hours. Thyroid Function Tests No results for input(s): TSH, T4TOTAL, T3FREE, THYROIDAB in the last 72 hours.  Invalid input(s): FREET3 _____________  DG Chest 2 View  Result Date: 11/22/2020 CLINICAL DATA:  Shortness of breath, peripheral edema, history of CHF, COPD, cirrhosis, coronary artery disease, hypertension EXAM: CHEST - 2 VIEW COMPARISON:  11/07/2020 FINDINGS: Enlargement of cardiac silhouette with pulmonary vascular congestion. Atherosclerotic calcification aorta. Chronic bibasilar  pleural thickening, RIGHT pleural calcification, blunting of the costophrenic angles, and bibasilar atelectasis. Slight chronic accentuation of interstitial markings stable. No pneumothorax. Mitral annular calcification and coronary arterial stent noted. 4.8 x 3.2 cm diameter opacity projects over LEFT heart on PA view, not localized on lateral view, may represent loculated fluid or consolidation, mass unlikely but not excluded. Bones unremarkable. IMPRESSION: Enlargement of cardiac silhouette. Chronic bibasilar scarring and atelectasis as well as chronic interstitial disease. Questionable 4.8 x 3.2 cm opacity projecting over the LEFT heart, potentially loculated fluid or consolidation, mass not excluded; either CT assessment or radiographic follow-up until resolution recommended to exclude tumor. Aortic Atherosclerosis (ICD10-I70.0). Electronically Signed   By: Lavonia Dana M.D.   On: 11/22/2020 08:23   US Venous Img Lower Bilateral  Result Date: 11/21/2020 CLINICAL DATA:  Bilateral lower extremity edema EXAM: BILATERAL LOWER EXTREMITY VENOUS DOPPLER ULTRASOUND TECHNIQUE: Gray-scale sonography with compression, as well as color and duplex ultrasound, were performed to evaluate the deep venous system(s) from the level of the common femoral vein through the popliteal and proximal calf veins. COMPARISON:  None. FINDINGS: VENOUS Normal compressibility of the common femoral, superficial femoral, and popliteal veins, as well as the visualized calf veins. Visualized portions of profunda femoral vein and great saphenous vein unremarkable. No filling defects to suggest DVT on grayscale or color Doppler imaging. Doppler waveforms show normal direction of venous flow, normal respiratory plasticity and response to augmentation. OTHER None. Limitations: none IMPRESSION: No lower extremity DVT. Electronically Signed   By: Miachel Roux M.D.   On: 11/21/2020 16:52   DG Chest Port 1 View  Result Date: 11/21/2020 CLINICAL  DATA:  Edema, bilateral leg swelling EXAM: PORTABLE CHEST 1 VIEW COMPARISON:  11/21/2020, 12:14 p.m. FINDINGS: No significant change in chest radiographs. Cardiomegaly with diffuse bilateral interstitial pulmonary opacity and small bilateral pleural effusions. Pleural thickening and calcification about the lung bases. Osseous structures are unremarkable. IMPRESSION: No significant change in chest radiographs. Cardiomegaly with diffuse bilateral interstitial pulmonary opacity and small bilateral pleural effusions. Findings are most consistent with edema. Electronically Signed   By: Eddie Candle M.D.   On: 11/21/2020 16:59   DG Chest Port 1 View  Result Date: 11/07/2020 CLINICAL DATA:  AFib. EXAM: PORTABLE CHEST 1 VIEW COMPARISON:  09/05/2020 FINDINGS: 1102 hours. The cardio pericardial silhouette is enlarged. Interstitial markings are diffusely coarsened with chronic features.  Similar patchy opacity at the lung bases suggesting scarring given interval stability. Small bilateral pleural effusions evident. The visualized bony structures of the thorax show no acute abnormality. Telemetry leads overlie the chest. IMPRESSION: Stable. Chronic interstitial lung disease with bibasilar scarring and small bilateral pleural effusions. Electronically Signed   By: Misty Stanley M.D.   On: 11/07/2020 11:31   MR CARDIAC MORPHOLOGY W WO CONTRAST  Result Date: 11/25/2020 CLINICAL DATA:  Evaluate HCM, valvular heart disease EXAM: CARDIAC MRI TECHNIQUE: The patient was scanned on a 1.5 Tesla Siemens magnet. A dedicated cardiac coil was used. Functional imaging was done using Fiesta sequences. 2,3, and 4 chamber views were done to assess for RWMA's. Modified Simpson's rule using a short axis stack was used to calculate an ejection fraction on a dedicated work Conservation officer, nature. The patient received 8 cc of Gadavist. After 10 minutes inversion recovery sequences were used to assess for infiltration and scar tissue.  CONTRAST:  8 cc  of Gadavist FINDINGS: Left ventricle: -Asymmetric hypertrophy measuring 75m in basal septum (756min posterior wall) -Mild dilatation -Normal systolic function -Nonspecific ECV elevation (36%) -Patchy LGE at RV insertion sites and basal septum. LGE accounts for 12% of total myocardial mass LV EF: 61% (Normal 56-78%) Absolute volumes: LV EDV: 18739mNormal 77-195 mL) LV ESV: 55m39mormal 19-72 mL) LV SV: 113mL30mrmal 51-133 mL) CO: 6.3L/min (Normal 2.8-8.8 L/min) Indexed volumes: LV EDV: 97mL/48m (Normal 47-92 mL/sq-m) LV ESV: 38mL/s54m(Normal 13-30 mL/sq-m) LV SV: 59mL/sq9mNormal 32-62 mL/sq-m) CI: 3.3L/min/sq-m (Normal 1.7-4.2 L/min/sq-m) Right ventricle: Mild dilatation with normal systolic function RV EF:  50% (Normal 47-74%) Absolute volumes: RV EDV: 207mL (No47m 88-227 mL) RV ESV: 103mL (Nor13m23-103 mL) RV SV: 105mL (Norm31m2-138 mL) CO: 5.8L/min (Normal 2.8-8.8 L/min) Indexed volumes: RV EDV: 107mL/sq-m (42mal 55-105 mL/sq-m) RV ESV: 53mL/sq-m (N75ml 15-43 mL/sq-m) RV SV: 54mL/sq-m (No1m 32-64 mL/sq-m) CI: 3.0L/min/sq-m (Normal 1.7-4.2 L/min/sq-m) Left atrium: Moderate enlargement Right atrium: Mild enlargement Mitral valve: Moderate regurgitation with regurgitant fraction 35% (40% cutoff for severe in cardiac MRI) Aortic valve: Moderate to severe regurgitation with regurgitant fraction 32% (35% cutoff for severe in cardiac MRI) Tricuspid valve: Moderate to severe regurgitation with regurgitant fraction 38% (40% cutoff for severe in cardiac MRI) Pulmonic valve: Mild regurgitation with regurgitant fraction 12% Aorta: Normal proximal ascending aorta Pulmonary artery: Dilated main PA measuring 36mm Pericardi16mNormal Extracardiac structures: Bilateral lower lobe airspace disease, consider CT chest IMPRESSION: 1. Asymmetric LV hypertrophy measuring 21mm in basal s42mm (7mm in posterior48mll), consistent with hypertrophic cardiomyopathy 2. Patchy late gadolinium enhancement at RV  insertion sites and basal septum, consistent with HCM. LGE accounts for 12% of total myocardial mass 3.  Mild LV dilatation with normal systolic function (EF 61%) 4.  Mild RV 22%atation with normal systolic function (EF 50%) 5. Moderate 63%severe aortic valve regurgitation with regurgitant fraction 32% (35% cutoff for severe AI in cardiac MRI) 6. Moderate to severe tricuspid valve regurgitation with regurgitant fraction 38% (40% cutoff for severe TR) 7. Moderate mitral valve regurgitation with regurgitant fraction 35% (40% cutoff for severe MR) 8.  Mild pulmonic valve regurgitation with regurgitant fraction 12% 9. Bilateral lower lobe airspace disease, consider CT chest for further evaluation Electronically Signed   By: Christopher  SchuOswaldo [name]/2022 18:10   Disposition   Pt is being discharged home today in good condition.  Follow-up Plans & Appointments     Follow-up Information     Lawrence, KathrynJory Sims  M, NP Follow up on 12/09/2020.   Specialties: Nurse Practitioner, Radiology, Cardiology Why: at 11:15am for your follow up appt with Dr. Evette Georges NP Contact information: [address] [place] [address] [place] [address] [place] Follow up.   Why: [facility] [phone]  They will call you with the scheduled apt times        [place], [facility] Follow up.   Why: outpatient palliative services Contact information: [address] [place] [address] [phone]                Discharge Instructions     Diet - low sodium heart healthy   Complete by: As directed    Face-to-face encounter (required for Medicare/Medicaid patients)   Complete by: As directed    I Reino Bellis certify that this patient is under my care and that I, or a nurse practitioner or physician's assistant working with me, had a face-to-face encounter that meets the physician face-to-face encounter requirements with this patient on 11/29/2020. The encounter  with the patient was in whole, or in part for the following medical condition(s) which is the primary reason for home health care (List medical condition): deconditioned, COPD, Valvular disease   The encounter with the patient was in whole, or in part, for the following medical condition, which is the primary reason for home health care: deconditioned, COPD, valvular disease   I certify that, based on my findings, the following services are medically necessary home health services: Nursing   Reason for Medically Necessary Home Health Services: Other See Comments   My clinical findings support the need for the above services: OTHER SEE COMMENTS   Further, I certify that my clinical findings support that this patient is homebound due to: Ambulates short distances less than 300 feet   Home Health   Complete by: As directed    To provide the following care/treatments:  [place]     Increase activity slowly   Complete by: As directed       Discharge Medications   Allergies as of 11/29/2020   No Known Allergies      Medication List     STOP taking these medications    losartan 25 MG tablet Commonly known as: COZAAR       TAKE these medications    apixaban 5 MG Tabs tablet Commonly known as: ELIQUIS Take 1 tablet (5 mg total) by mouth 2 (two) times daily.   cholecalciferol 1000 units tablet Commonly known as: VITAMIN D Take 1,000 Units by mouth daily.   cyanocobalamin 1000 MCG tablet Commonly known as: CVS VITAMIN B12 Take 1 tablet (1,000 mcg total) by mouth daily.   dofetilide 250 MCG capsule Commonly known as: TIKOSYN Take 1 capsule (250 mcg total) by mouth 2 (two) times daily. What changed:  medication strength how much to take   furosemide 20 MG tablet Commonly known as: LASIX Take 20 mg by mouth 2 (two) times daily.   omeprazole 10 MG capsule Commonly known as: PRILOSEC TAKE ONE CAPSULE BY MOUTH ONE TIME DAILY   polyethylene glycol 17 g  packet Commonly known as: MIRALAX / GLYCOLAX Take 17 g by mouth as needed for mild constipation.   rosuvastatin 10 MG tablet Commonly known as: CRESTOR Take 1 tablet (10 mg total) by mouth daily.   tamsulosin 0.4 MG Caps capsule Commonly known as: FLOMAX Take 1 capsule (0.4 mg total) by  mouth daily.        Outstanding Labs/Studies   N/a   Duration of Discharge Encounter   Greater than 30 minutes including physician time.  Signed, Reino Bellis, NP 11/29/2020, 2:32 PM

## 2020-11-29 NOTE — Progress Notes (Signed)
Notified by tech of sats in the 80's on 4LNC.  Placed on NRB, sats to 99%.  Removed NRB and sats to the 80's within a minute or two.  Replaced NRB and called NP and Respiratory. Will start atrovent neb and reassess.  NP at bedside.

## 2020-11-29 NOTE — Consult Note (Signed)
   [facility] Inpatient Consult   11/29/2020  DONA KLEMANN 1945-11-03 [id number]  [place] Organization [ACO] Patient: [employer] Medicare/V[employer] noted  Primary Care Provider:  [name], [name], MD, [facility]  LLOS: 7 days medium risk  Patient was screened for [place] [facility] services. Patient will have the transition of care call conducted by the primary care provider as listed. This patient is also in an Embedded practice which has a chronic disease management Embedded Care Management team. 12:25 came by to speak with patient listed for transitioning home today however he was receiving a respiratory therapy treatment.   3:50 pm Patient and family speaking with inpatient [facility] RNCM  Plan: Will refer patient for follow up.   Please contact for further questions,  Natividad Brood, RN BSN [facility] Liaison  [phone] business mobile phone Toll free office [phone]  Fax number: [phone] [email or web address]

## 2020-11-29 NOTE — Progress Notes (Signed)
OK for DC per NP. PIV removed. Discharge instructions completed. Patient verbalized understanding of medication regimen, follow up appointments and discharge instructions. Patient belongings gathered and packed to discharge. Waiting for ride to arrive.

## 2020-11-29 NOTE — Progress Notes (Signed)
This chaplain is present with the Pt., family, notary and two witnesses for the notarizing of the Pt. Advance Directive:  HCPOA and Living Will.  The Pt. named Joshua [name] as his healthcare agent. If the healthcare agent is unwilling or unable to serve the Pt. names [name] as his second choice.  The chaplain gave the Pt. the original document and two copies.  The chaplain scanned the AD into the Pt. EMR.  The chaplain is available for F/U spiritual care as needed.

## 2020-11-29 NOTE — Progress Notes (Signed)
Physical Therapy Treatment Patient Details Name: Joshua [name] MRN: [id number] DOB: Mar 07, 1945 Today's Date: 11/29/2020   History of Present Illness Patient is a 75 y/o male who presents to [facility] on 11/21/20 with BLE swelling. Found to have acute on chronic CHF and severe end stage valvular disease. PMH includes PAF, LBBB, CHF, COPD, CAD, HTN,    PT Comments    Patient received sitting up on side of bed. He is HOH. Patient agreeable to PT session. He transfers with mod independence. Ambulated 400 feet with RW and 3-4 liters O2. Unable to get adequate O2 reading this session. Patient without significant sob during session. He will continue to benefit from skilled PT while here to improve independence and safety.      Recommendations for follow up therapy are one component of a multi-disciplinary discharge planning process, led by the attending physician.  Recommendations may be updated based on patient status, additional functional criteria and insurance authorization.  Follow Up Recommendations  No PT follow up;Supervision - Intermittent     Equipment Recommendations  None recommended by PT    Recommendations for Other Services       Precautions / Restrictions Precautions Precautions: Fall Restrictions Weight Bearing Restrictions: No     Mobility  Bed Mobility               General bed mobility comments: patient seated on side of bed upon arrival to room    Transfers Overall transfer level: Modified independent Equipment used: Rolling walker (2 wheeled) Transfers: Sit to/from Stand Sit to Stand: Modified independent (Device/Increase time)            Ambulation/Gait Ambulation/Gait assistance: Supervision Gait Distance (Feet): 400 Feet Assistive device: Rolling walker (2 wheeled) Gait Pattern/deviations: Step-through pattern;Decreased stride length Gait velocity: decr   General Gait Details: difficulty getting O2 saturatons this session. Patient  ambulated on 3-4 lpm O2 ( reports he uses 2 liters at baseline). No significant sob with activity.   Stairs             Wheelchair Mobility    Modified Rankin (Stroke Patients Only)       Balance Overall balance assessment: Modified Independent                                          Cognition Arousal/Alertness: Awake/alert Behavior During Therapy: WFL for tasks assessed/performed Overall Cognitive Status: Within Functional Limits for tasks assessed                                        Exercises      General Comments        Pertinent Vitals/Pain Pain Assessment: No/denies pain    Home Living                      Prior Function            PT Goals (current goals can now be found in the care plan section) Acute Rehab PT Goals Patient Stated Goal: to go home PT Goal Formulation: With patient Time For Goal Achievement: 12/12/20 Potential to Achieve Goals: Good Progress towards PT goals: Progressing toward goals    Frequency    Min 3X/week      PT Plan Current plan remains appropriate  Co-evaluation              AM-PAC PT "6 Clicks" Mobility   Outcome Measure  Help needed turning from your back to your side while in a flat bed without using bedrails?: A Little Help needed moving from lying on your back to sitting on the side of a flat bed without using bedrails?: A Little Help needed moving to and from a bed to a chair (including a wheelchair)?: None Help needed standing up from a chair using your arms (e.g., wheelchair or bedside chair)?: None Help needed to walk in hospital room?: None Help needed climbing 3-5 steps with a railing? : A Little 6 Click Score: 21    End of Session Equipment Utilized During Treatment: Gait belt;Oxygen Activity Tolerance: Patient tolerated treatment well Patient left: in chair;with call bell/phone within reach Nurse Communication: Mobility status PT Visit  Diagnosis: Muscle weakness (generalized) (M62.81)     Time: 3007-6226 PT Time Calculation (min) (ACUTE ONLY): 23 min  Charges:  $Gait Training: 23-37 mins                     [employer], PT, GCS 11/29/20,9:42 AM

## 2020-11-29 NOTE — Progress Notes (Signed)
This chaplain responded to PMT consult for notarizing the Pt. Advance Directive:  HCPOA and Living Will.  The chaplain understands the Pt. will use the hospital services for notarizing his AD.  The chaplain is contacting the notary to set up an appointment.  The chaplain understands through story telling and reflective listening, family is a source of strength for the Pt.  The Pt. stories honor the [employer] of generations of caregivers.   The chaplain will F/U today with the notary.

## 2020-11-29 NOTE — TOC Transition Note (Addendum)
Transition of Care [facility]) - CM/SW Discharge Note   Patient Details  Name: Joshua [name] MRN: [id number] Date of Birth: 04-02-1945  [facility]) CM/SW Contact:  Zenon Mayo, RN Phone Number: 11/29/2020, 1:38 PM   Clinical Narrative:    NCM spoke with patient at bedside, he states he has a rolling walker at home.  He states to call his daughter regarding choice from Medicare. Gov list for [facility] and outpatient palliative services.  NCM contacted Carina, she states she does not have a preference for [facility] and she would like [facility] for outpatient palliative services. NCM made referral to [facility] with [facility] for [facility], she is able to take referral , soc will begin 24 to 48hrs post dc.  NCM made referral to  Cassandra with [facility] for outpatient palliative services.   Patient has home oxygen with [employer] in [place] also 2 liters.  Patient has his portable oxygen in the room, and he only has 16% available,  not sure if this is enough to get him home.  Will ask Thedore Mins with [employer] to come take a look at the device to see if it will be enough for him to get home.   Final next level of care: [place] Barriers to Discharge: No Barriers Identified   Patient Goals and CMS Choice Patient states their goals for this hospitalization and ongoing recovery are:: return home with daughter CMS Medicare.gov Compare Post Acute Care list provided to:: Patient Represenative (must comment) Choice offered to / list presented to : Adult Children  Discharge Placement                       Discharge Plan and Services                  DME Agency: NA       HH Arranged: RN [place] Agency: [place] ([facility]) Date [place]: 11/29/20 Time [place]: 1338 Representative spoke with at [place]: [place] Determinants of Health ([place]) Interventions     Readmission Risk Interventions No  flowsheet data found.

## 2020-12-02 ENCOUNTER — Telehealth: Payer: Self-pay

## 2020-12-02 NOTE — Telephone Encounter (Signed)
Transition Care Management Follow-up Telephone Call Date of discharge and from where: 11/29/2020  [facility] How have you been since you were released from the hospital? "TIRED" Any questions or concerns? No  Items Reviewed: Did the pt receive and understand the discharge instructions provided? Yes  Medications obtained and verified? Yes  Other? No  Any new allergies since your discharge? No  Dietary orders reviewed? Yes Do you have support at home? Yes   Home Care and Equipment/Supplies: Were home health services ordered? yes If so, what is the name of the agency? [place]  Has the agency set up a time to come to the patient's home? No  Provided contact number for patient to call himself Were any new equipment or medical supplies ordered?  No What is the name of the medical supply agency?  Were you able to get the supplies/equipment? not applicable Do you have any questions related to the use of the equipment or supplies? No  Functional Questionnaire: (I = Independent and D = Dependent) ADLs: with assistance  Bathing/Dressing- with assistance  Meal Prep- with assistance  Eating- I  Maintaining continence- I  Transferring/Ambulation- I  Managing Meds- I  Follow up appointments reviewed:  PCP Hospital f/u appt confirmed? Yes   Specialist Hospital f/u appt confirmed? No   Are transportation arrangements needed? No  If their condition worsens, is the pt aware to call PCP or go to the Emergency Dept.? Yes Was the patient provided with contact information for the PCP's office or ED? Yes Was to pt encouraged to call back with questions or concerns? Yes  Tomasa Rand, RN, BSN, CEN [facility] [employer] [phone]

## 2020-12-08 NOTE — H&P (View-Only) (Signed)
History and Physical Cardiology    Date:  12/08/2020   ID:  [name], DOB 1946/01/08, MRN [id number]  PCP:  [name], [name], MD  Cardiologist:  Dr. Claiborne Billings  CC: Follow Up Hospitalization    History of Present Illness: Joshua [name] is a 75 y.o. male who presents for posthospitalization follow-up after admission between 11/21/2020 and 11/29/2020 for acute on chronic diastolic heart failure.  He presented with complaints of "feeling blah", dry cough, leg edema.  He admitted to not being very active.  The chest x-ray revealed small bilateral effusions, with a 4.8 x 3.2 cm opacity over the left heart with recommendations for follow-up chest x-ray.  He was given Diamox 250 mg x 2.  He was started on Jardiance however his creatinine increased and therefore this was discontinued.  The patient is now on palliative care.  Losartan and spironolactone were discontinued due to hypotension.  The patient's Tikosyn was reduced from 500 mcg twice daily to 250 mcg twice daily in the setting of episodes of bradycardia with rates in the 30s.  On discharge she converted into atrial flutter with variable block.  It was noted that he will not be able to maintain normal sinus rhythm with underlying valvular heart disease.  For CAD he was to continue DOAC but aspirin was discontinued.  He was continued on statin therapy.  The patient was provided with oxygen 3 L per nasal cannula which she was using at home prior to admission.  Therefore, during hospitalization the patient did have a cardiac MRI on 11/25/2020 which also revealed moderate to severe aortic valve regurgitation with regurgitant fraction of 32% (35% cutoff for severe AI and cardiac MRI).  He also had moderate to severe tricuspid valve regurgitation with regurgitant fraction 38%, (40% cutoff for severe TR).  [name] has a history of HFpEF, coronary artery disease status post prior PCI to the LAD and RCA, moderate MR MS, A AR, with persistent atrial  fibrillation/flutter, hypertension, COPD on home O2.  He was also recently admitted for lower GI bleed in July 2022 which was secondary to internal hemorrhoids.  He would therefore was continued on Eliquis for CVA prevention.  [name] comes today with a friend who is concerned about him.  He has become more short of breath despite his oxygen at 3 L, having lower extremity edema up into the thighs, and having increased confusion.  She states that she has noticed that he is taking his medicines on some days, other days he is taking extra doses, and is inconsistent concerning compliance.  She also states that he is not on a low-sodium diet.  She is also concerned because he has 2 daughters who are his main caregivers, who are not as attentive.  Although this cannot be confirmed.  Both daughters are local.  The patient complains of lower extremity edema, feeling like his legs are heavy, I had to place his oxygen on his nose on several occasions during the office encounter.  He does have some mild confusion.  Oxygen saturations have been between 80% and 82% per O2 sat monitor.   Past Medical History:  Diagnosis Date   Acute lower GI bleeding    related to gastritis / viral infection   Cataract    Cirrhosis ([place])    Congestive heart failure (CHF) ([place]) 11/21/2020   COPD (chronic obstructive pulmonary disease) ([place])    PFT 05-05-2010, FEV1 .9 (26%) ratio 60   Coronary artery disease  Gastritis 06-10-2006   Hiatal hernia 06-10-2006   EGD   HOH (hard of hearing)    Hx of adenomatous colonic polyps 2005, 2013   Colonoscopy   Hypercholesteremia    Hypertension    Hypertr obst cardiomyop    mild subaortic stenosis   Iron deficiency anemia, unspecified    LVH (left ventricular hypertrophy)    Other B-complex deficiencies    Persistent atrial fibrillation (HCC)    persistent   Rheumatic fever    Thrombocytopenia ([place]) 05/2008    Past Surgical History:  Procedure Laterality Date   ATRIAL  FLUTTER ABLATION  06/2010   CTI ablation performed by [name]   CARDIAC CATHETERIZATION  05/28/2005   Widely patent coronaries and normal LV function.   CARDIAC CATHETERIZATION  08/11/2001   80% LAD stenosis successfully stented with a 3.5x33mm Cypher DES postdilated to 3.42mm resulting in reduction of 80% to 0%.   CARDIAC CATHETERIZATION  01/15/2001   Focal 95% proximal RCA stenosis, stented with a 3x71mm Zeta multilink stent with postdilatation utilizing a 3.5x33mm Quantum balloon with stenosis being reduced to 0%.   CARDIOVASCULAR STRESS TEST  05/29/2011   No scintigraphic evidence of inducible myocardial ischemia. No ECG changes. EKG negative for ischemia.   CARDIOVERSION N/A 10/20/2019   Procedure: CARDIOVERSION;  Surgeon: Sanda Klein, MD;  Location: [facility];  Service: Cardiovascular;  Laterality: N/A;   CARDIOVERSION N/A 01/14/2020   Procedure: CARDIOVERSION;  Surgeon: Buford Dresser, MD;  Location: [facility] ENDOSCOPY;  Service: Cardiovascular;  Laterality: N/A;   CORONARY ANGIOPLASTY     2002-2003   SHOULDER SURGERY     TRANSTHORACIC ECHOCARDIOGRAM  05/29/2011   EF >70%, severe concentric LV hypertrophy, severe mitral annular calcification, mild-moderate aortic regurg,      Current Outpatient Medications  Medication Sig Dispense Refill   apixaban (ELIQUIS) 5 MG TABS tablet Take 1 tablet (5 mg total) by mouth 2 (two) times daily. 180 tablet 1   cholecalciferol (VITAMIN D) 1000 units tablet Take 1,000 Units by mouth daily.     cyanocobalamin ([employer] VITAMIN B12) 1000 MCG tablet Take 1 tablet (1,000 mcg total) by mouth daily.     dofetilide (TIKOSYN) 250 MCG capsule Take 1 capsule (250 mcg total) by mouth 2 (two) times daily. 60 capsule 2   furosemide (LASIX) 20 MG tablet Take 20 mg by mouth 2 (two) times daily.     omeprazole (PRILOSEC) 10 MG capsule TAKE ONE CAPSULE BY MOUTH ONE TIME DAILY (Patient taking differently: Take 10 mg by mouth daily.) 90 capsule 0   polyethylene  glycol (MIRALAX / GLYCOLAX) packet Take 17 g by mouth as needed for mild constipation.  (Patient not taking: Reported on 11/22/2020)     rosuvastatin (CRESTOR) 10 MG tablet Take 1 tablet (10 mg total) by mouth daily. 90 tablet 3   tamsulosin (FLOMAX) 0.4 MG CAPS capsule Take 1 capsule (0.4 mg total) by mouth daily. 90 capsule 3   No current facility-administered medications for this visit.    Allergies:   Patient has no known allergies.    Social History:  The patient  reports that he quit smoking about 35 years ago. His smoking use included cigarettes. He has a 10.00 pack-year smoking history. He has never used smokeless tobacco. He reports that he does not drink alcohol and does not use drugs.   Family History:  The patient's family history includes Asthma in his brother; CAD in his father and paternal uncle; Cancer in his maternal grandfather; Colon cancer  in his maternal grandfather; Emphysema in his brother; Heart attack (age of onset: 58) in his brother; Heart disease in his mother; Hyperlipidemia in his father; Hypertension in his sister; Lung cancer in his brother; Stroke in his mother and paternal uncle.    ROS: All other systems are reviewed and negative. Unless otherwise mentioned in H&P    PHYSICAL EXAM: VS:  There were no vitals taken for this visit. , BMI There is no height or weight on file to calculate BMI. GEN: Well nourished, well developed, in no acute distress HEENT: normal Neck: no JVD, carotid bruits, or masses Cardiac: Irregular RRR; 2/6 systolic murmurs, rubs, or gallops, 2+ to 3+ pitting edema pretibially and bilateral thigh edema, mild testicular edema Respiratory: Absent breath sounds bilaterally with crackles mid lung, wearing oxygen via nasal cannula at 3 L. GI: soft, nontender, nondistended, + BS MS: no deformity or atrophy, massive edema noted bilaterally. Skin: warm and dry, no rash, pale Neuro:  Strength and sensation are intact Psych: euthymic mood, full  affect, uncertain with some confused responses.   EKG:  EKG is not ordered today.    Recent Labs: 11/21/2020: ALT 10; B Natriuretic Peptide 391.0; Hemoglobin 13.3; Platelets 126 11/24/2020: Magnesium 2.7 11/28/2020: BUN 25; Creatinine, Ser 0.97; Potassium 5.0; Sodium 141    Lipid Panel    Component Value Date/Time   CHOL 149 06/13/2020 1125   CHOL 126 08/20/2012 0930   TRIG 59 06/13/2020 1125   TRIG 113 10/31/2016 0850   TRIG 94 08/20/2012 0930   HDL 79 06/13/2020 1125   HDL 46 10/31/2016 0850   HDL 41 08/20/2012 0930   CHOLHDL 1.9 06/13/2020 1125   LDLCALC 58 06/13/2020 1125   LDLCALC 63 09/29/2013 0907   LDLCALC 66 08/20/2012 0930      Wt Readings from Last 3 Encounters:  11/29/20 167 lb 12.8 oz (76.1 kg)  11/21/20 169 lb (76.7 kg)  11/07/20 169 lb 12.1 oz (77 kg)      Other studies Reviewed: Cardiac MRI: 11/01/2020 cMRI showed severe ASSH c/w HOCM and patchy LGE with mild LVE and normal LVF, mild RVE and normal RVF, moderate to severe AR with RF 32%, moderate to severe TR (RF 38%) and moderate MR (RF 35%) and bilateral LL airspace disease.   -- seen by Dr. Chipper Oman not a candidate for SAVR for mitral valve or TV disease and feel that replacing his AV for severe AR will not improve his situation given his underlying severe PHTN and severe MV disease  Echocardiogram 10/05/2020 1. Left ventricular ejection fraction, by estimation, is 65 to 70%. The  left ventricle has normal function. The left ventricle has no regional  wall motion abnormalities. There is moderate asymmetric left ventricular  hypertrophy of the basal-septal  segment (15 mm). No dynamic LVOT obstruction demonstrated on this exam.  Left ventricular diastolic parameters are indeterminate.   2. Right ventricular systolic function is mildly reduced. The right  ventricular size is mildly enlarged. There is severely elevated pulmonary  artery systolic pressure. The estimated right ventricular systolic   pressure is 24.5 mmHg.   3. Left atrial size was mild to moderately dilated.   4. The mitral valve is degenerative. Moderate mitral valve regurgitation.  Moderate to severe mitral stenosis. The mean mitral valve gradient is 7.0  mmHg with average heart rate of 55 bpm. Severe mitral annular  calcification.   5. Tricuspid valve regurgitation is moderate to severe.   6. The aortic valve is abnormal. There  is mild calcification of the  aortic valve. Aortic valve regurgitation is severe. Mild aortic valve  stenosis. Aortic valve mean gradient measures 13.4 mmHg.   7. Pulmonic valve regurgitation is moderate.   8. The inferior vena cava is normal in size with <50% respiratory  variability, suggesting right atrial pressure of 8 mmHg.   ASSESSMENT AND PLAN:  1.  Acute on chronic diastolic CHF: Patient has significant volume overload.  He will require admission for IV diuretics.  He has gained approximately 7 to 8 pounds since discharge, 10 days ago.  The patient has just been discharged on November 29, 2020 for similar diagnoses.  He has been inconsistent on taking his medications and with low-sodium diet.  Friend who has brought him is concerned about his ability to take care of himself alone although he does have 2 daughters who are local who are caregivers.  I have discussed this with Dr. Debara Pickett, DOD at [facility] office today, who was also seen and examined the patient.  He is in agreement with the need for direct admit to [facility] for IV diuresis.  It is my concern that the patient may not be able to return home and take care of himself on his own and therefore social services will need to become involved to consider skilled nursing home placement.  2.  Severe valvular heart disease: Mild aortic valve stenosis with severe aortic valve regurgitation, moderate to severe mitral valve stenosis with severe TR.  Per cardiac MRI completed 11/01/2020.  3.  Atrial fibrillation: The patient is on  Tikosyn 250 mg twice daily and Eliquis 5 mg twice daily.  Heart rate remains irregular.  He denies any evidence of bleeding hemoptysis or hematemesis.  4.  CAD: Most recent cardiac catheterization 05/28/2005 with widely patent coronaries and normal LV function, prior history of 80% LAD stenosis with stent using a 3.5 x 23 mm Cypher DES.Marland Kitchen  He denies any chest pain at this time.  5.  COPD: Oxygen dependent at 3 L per nasal cannula.  The patient had a history of bilateral pleural effusions during recent hospitalization and lung sounds are absent on the bases.  Repeating chest x-ray on admission.  The patient O2 sats were at 80% to 82% in the office on oxygen.  Will likely need to have increased oxygen delivery versus BiPAP.  6.   Hypercholesterolemia: Remains on statin therapy rosuvastatin 10 mg daily.  7.  Mild confusion: May be related to hypoxic encephalopathy.  Assessment will need to continue throughout hospitalization.  8.  DNR: Status has been confirmed on review of chart most recently documented on 12/01/2020.  Current medicines are reviewed at length with the patient today.  I have spent 610 minutes dedicated to the care of this patient on the date of this encounter to include pre-visit review of records, assessment, management and diagnostic testing,with shared decision making.  I have also spoken with Dr. Debara Pickett, DOD at [facility] office today who has seen and examined the patient and agrees with need for admission.  Labs/ tests ordered today include: Direct Admission with diagnosis of Acute on Chronic Diastolic CHF.    Phill Myron. West Pugh, ANP, AACC   12/08/2020 12:06 PM    [facility] [place] [address] Office [phone] Fax [phone]  Notice: This dictation was prepared with Dragon dictation along with smaller phrase technology. Any transcriptional errors that result from this process are unintentional and may not be corrected  upon review.

## 2020-12-08 NOTE — H&P (Signed)
History and Physical Cardiology    Date:  12/08/2020   ID:  Joshua [name], DOB [birth year], MRN [id number]  PCP:  [name], Joshua Kaufmann, MD  Cardiologist:  Dr. Claiborne [name]  CC: Follow Up Hospitalization    History of Present Illness: Joshua [name] is a 75 y.o. male who presents for posthospitalization follow-up after admission between 11/21/2020 and 11/29/2020 for acute on chronic diastolic heart failure.  He presented with complaints of "feeling blah", dry cough, leg edema.  He admitted to not being very active.  The chest x-ray revealed small bilateral effusions, with a 4.8 x 3.2 cm opacity over the left heart with recommendations for follow-up chest x-ray.  He was given Diamox 250 mg x 2.  He was started on Jardiance however his creatinine increased and therefore this was discontinued.  The patient is now on palliative care.  Losartan and spironolactone were discontinued due to hypotension.  The patient's Tikosyn was reduced from 500 mcg twice daily to 250 mcg twice daily in the setting of episodes of bradycardia with rates in the 30s.  On discharge she converted into atrial flutter with variable block.  It was noted that he will not be able to maintain normal sinus rhythm with underlying valvular heart disease.  For CAD he was to continue DOAC but aspirin was discontinued.  He was continued on statin therapy.  The patient was provided with oxygen 3 L per nasal cannula which she was using at home prior to admission.  Therefore, during hospitalization the patient did have a cardiac MRI on 11/25/2020 which also revealed moderate to severe aortic valve regurgitation with regurgitant fraction of 32% (35% cutoff for severe AI and cardiac MRI).  He also had moderate to severe tricuspid valve regurgitation with regurgitant fraction 38%, (40% cutoff for severe TR).  Joshua [name] has a history of HFpEF, coronary artery disease status post prior PCI to the LAD and RCA, moderate MR MS, A AR, with persistent atrial  fibrillation/flutter, hypertension, COPD on home O2.  He was also recently admitted for lower GI bleed in July 2022 which was secondary to internal hemorrhoids.  He would therefore was continued on Eliquis for CVA prevention.  Joshua [name] comes today with a friend who is concerned about him.  He has become more short of breath despite his oxygen at 3 L, having lower extremity edema up into the thighs, and having increased confusion.  She states that she has noticed that he is taking his medicines on some days, other days he is taking extra doses, and is inconsistent concerning compliance.  She also states that he is not on a low-sodium diet.  She is also concerned because he has 2 daughters who are his main caregivers, who are not as attentive.  Although this cannot be confirmed.  Both daughters are local.  The patient complains of lower extremity edema, feeling like his legs are heavy, I had to place his oxygen on his nose on several occasions during the office encounter.  He does have some mild confusion.  Oxygen saturations have been between 80% and 82% per O2 sat monitor.   Past Medical History:  Diagnosis Date   Acute lower GI bleeding    related to gastritis / viral infection   Cataract    Cirrhosis ([place])    Congestive heart failure (CHF) ([place]) 11/21/2020   COPD (chronic obstructive pulmonary disease) ([place])    PFT 05-05-2010, FEV1 .9 (26%) ratio 60   Coronary artery disease  Gastritis 06-10-2006   Hiatal hernia 06-10-2006   EGD   HOH (hard of hearing)    Hx of adenomatous colonic polyps 2005, 2013   Colonoscopy   Hypercholesteremia    Hypertension    Hypertr obst cardiomyop    mild subaortic stenosis   Iron deficiency anemia, unspecified    LVH (left ventricular hypertrophy)    Other B-complex deficiencies    Persistent atrial fibrillation (HCC)    persistent   Rheumatic fever    Thrombocytopenia ([place]) 05/2008    Past Surgical History:  Procedure Laterality Date   ATRIAL  FLUTTER ABLATION  06/2010   CTI ablation performed by [name]   CARDIAC CATHETERIZATION  05/28/2005   Widely patent coronaries and normal LV function.   CARDIAC CATHETERIZATION  08/11/2001   80% LAD stenosis successfully stented with a 3.5x61mm Cypher DES postdilated to 3.6mm resulting in reduction of 80% to 0%.   CARDIAC CATHETERIZATION  01/15/2001   Focal 95% proximal RCA stenosis, stented with a 3x35mm Zeta multilink stent with postdilatation utilizing a 3.5x69mm Quantum balloon with stenosis being reduced to 0%.   CARDIOVASCULAR STRESS TEST  05/29/2011   No scintigraphic evidence of inducible myocardial ischemia. No ECG changes. EKG negative for ischemia.   CARDIOVERSION N/A 10/20/2019   Procedure: CARDIOVERSION;  Surgeon: Sanda Klein, MD;  Location: [facility];  Service: Cardiovascular;  Laterality: N/A;   CARDIOVERSION N/A 01/14/2020   Procedure: CARDIOVERSION;  Surgeon: Buford Dresser, MD;  Location[facility] ENDOSCOPY;  Service: Cardiovascular;  Laterality: N/A;   CORONARY ANGIOPLASTY     2002-2003   SHOULDER SURGERY     TRANSTHORACIC ECHOCARDIOGRAM  05/29/2011   EF >70%, severe concentric LV hypertrophy, severe mitral annular calcification, mild-moderate aortic regurg,      Current Outpatient Medications  Medication Sig Dispense Refill   apixaban (ELIQUIS) 5 MG TABS tablet Take 1 tablet (5 mg total) by mouth 2 (two) times daily. 180 tablet 1   cholecalciferol (VITAMIN D) 1000 units tablet Take 1,000 Units by mouth daily.     cyanocobalamin ([employer] VITAMIN B12) 1000 MCG tablet Take 1 tablet (1,000 mcg total) by mouth daily.     dofetilide (TIKOSYN) 250 MCG capsule Take 1 capsule (250 mcg total) by mouth 2 (two) times daily. 60 capsule 2   furosemide (LASIX) 20 MG tablet Take 20 mg by mouth 2 (two) times daily.     omeprazole (PRILOSEC) 10 MG capsule TAKE ONE CAPSULE BY MOUTH ONE TIME DAILY (Patient taking differently: Take 10 mg by mouth daily.) 90 capsule 0   polyethylene  glycol (MIRALAX / GLYCOLAX) packet Take 17 g by mouth as needed for mild constipation.  (Patient not taking: Reported on 11/22/2020)     rosuvastatin (CRESTOR) 10 MG tablet Take 1 tablet (10 mg total) by mouth daily. 90 tablet 3   tamsulosin (FLOMAX) 0.4 MG CAPS capsule Take 1 capsule (0.4 mg total) by mouth daily. 90 capsule 3   No current facility-administered medications for this visit.    Allergies:   Patient has no known allergies.    Social History:  The patient  reports that he quit smoking about 35 years ago. His smoking use included cigarettes. He has a 10.00 pack-year smoking history. He has never used smokeless tobacco. He reports that he does not drink alcohol and does not use drugs.   Family History:  The patient's family history includes Asthma in his brother; CAD in his father and paternal uncle; Cancer in his maternal grandfather; Colon cancer  in his maternal grandfather; Emphysema in his brother; Heart attack (age of onset: 64) in his brother; Heart disease in his mother; Hyperlipidemia in his father; Hypertension in his sister; Lung cancer in his brother; Stroke in his mother and paternal uncle.    ROS: All other systems are reviewed and negative. Unless otherwise mentioned in H&P    PHYSICAL EXAM: VS:  There were no vitals taken for this visit. , BMI There is no height or weight on file to calculate BMI. GEN: Well nourished, well developed, in no acute distress HEENT: normal Neck: no JVD, carotid bruits, or masses Cardiac: Irregular RRR; 2/6 systolic murmurs, rubs, or gallops, 2+ to 3+ pitting edema pretibially and bilateral thigh edema, mild testicular edema Respiratory: Absent breath sounds bilaterally with crackles mid lung, wearing oxygen via nasal cannula at 3 L. GI: soft, nontender, nondistended, + BS MS: no deformity or atrophy, massive edema noted bilaterally. Skin: warm and dry, no rash, pale Neuro:  Strength and sensation are intact Psych: euthymic mood, full  affect, uncertain with some confused responses.   EKG:  EKG is not ordered today.    Recent Labs: 11/21/2020: ALT 10; B Natriuretic Peptide 391.0; Hemoglobin 13.3; Platelets 126 11/24/2020: Magnesium 2.7 11/28/2020: BUN 25; Creatinine, Ser 0.97; Potassium 5.0; Sodium 141    Lipid Panel    Component Value Date/Time   CHOL 149 06/13/2020 1125   CHOL 126 08/20/2012 0930   TRIG 59 06/13/2020 1125   TRIG 113 10/31/2016 0850   TRIG 94 08/20/2012 0930   HDL 79 06/13/2020 1125   HDL 46 10/31/2016 0850   HDL 41 08/20/2012 0930   CHOLHDL 1.9 06/13/2020 1125   LDLCALC 58 06/13/2020 1125   LDLCALC 63 09/29/2013 0907   LDLCALC 66 08/20/2012 0930      Wt Readings from Last 3 Encounters:  11/29/20 167 lb 12.8 oz (76.1 kg)  11/21/20 169 lb (76.7 kg)  11/07/20 169 lb 12.1 oz (77 kg)      Other studies Reviewed: Cardiac MRI: 11/01/2020 cMRI showed severe ASSH c/w HOCM and patchy LGE with mild LVE and normal LVF, mild RVE and normal RVF, moderate to severe AR with RF 32%, moderate to severe TR (RF 38%) and moderate MR (RF 35%) and bilateral LL airspace disease.   -- seen by Dr. Chipper Oman not a candidate for SAVR for mitral valve or TV disease and feel that replacing his AV for severe AR will not improve his situation given his underlying severe PHTN and severe MV disease  Echocardiogram 10/05/2020 1. Left ventricular ejection fraction, by estimation, is 65 to 70%. The  left ventricle has normal function. The left ventricle has no regional  wall motion abnormalities. There is moderate asymmetric left ventricular  hypertrophy of the basal-septal  segment (15 mm). No dynamic LVOT obstruction demonstrated on this exam.  Left ventricular diastolic parameters are indeterminate.   2. Right ventricular systolic function is mildly reduced. The right  ventricular size is mildly enlarged. There is severely elevated pulmonary  artery systolic pressure. The estimated right ventricular systolic   pressure is 83.6 mmHg.   3. Left atrial size was mild to moderately dilated.   4. The mitral valve is degenerative. Moderate mitral valve regurgitation.  Moderate to severe mitral stenosis. The mean mitral valve gradient is 7.0  mmHg with average heart rate of 55 bpm. Severe mitral annular  calcification.   5. Tricuspid valve regurgitation is moderate to severe.   6. The aortic valve is abnormal. There  is mild calcification of the  aortic valve. Aortic valve regurgitation is severe. Mild aortic valve  stenosis. Aortic valve mean gradient measures 13.4 mmHg.   7. Pulmonic valve regurgitation is moderate.   8. The inferior vena cava is normal in size with <50% respiratory  variability, suggesting right atrial pressure of 8 mmHg.   ASSESSMENT AND PLAN:  1.  Acute on chronic diastolic CHF: Patient has significant volume overload.  He will require admission for IV diuretics.  He has gained approximately 7 to 8 pounds since discharge, 10 days ago.  The patient has just been discharged on November 29, 2020 for similar diagnoses.  He has been inconsistent on taking his medications and with low-sodium diet.  Friend who has brought him is concerned about his ability to take care of himself alone although he does have 2 daughters who are local who are caregivers.  I have discussed this with Dr. Debara Pickett, DOD at [facility] office today, who was also seen and examined the patient.  He is in agreement with the need for direct admit to [facility] for IV diuresis.  It is my concern that the patient may not be able to return home and take care of himself on his own and therefore social services will need to become involved to consider skilled nursing home placement.  2.  Severe valvular heart disease: Mild aortic valve stenosis with severe aortic valve regurgitation, moderate to severe mitral valve stenosis with severe TR.  Per cardiac MRI completed 11/01/2020.  3.  Atrial fibrillation: The patient is on  Tikosyn 250 mg twice daily and Eliquis 5 mg twice daily.  Heart rate remains irregular.  He denies any evidence of bleeding hemoptysis or hematemesis.  4.  CAD: Most recent cardiac catheterization 05/28/2005 with widely patent coronaries and normal LV function, prior history of 80% LAD stenosis with stent using a 3.5 x 23 mm Cypher DES.Marland Kitchen  He denies any chest pain at this time.  5.  COPD: Oxygen dependent at 3 L per nasal cannula.  The patient had a history of bilateral pleural effusions during recent hospitalization and lung sounds are absent on the bases.  Repeating chest x-ray on admission.  The patient O2 sats were at 80% to 82% in the office on oxygen.  Will likely need to have increased oxygen delivery versus BiPAP.  6.   Hypercholesterolemia: Remains on statin therapy rosuvastatin 10 mg daily.  7.  Mild confusion: May be related to hypoxic encephalopathy.  Assessment will need to continue throughout hospitalization.  8.  DNR: Status has been confirmed on review of chart most recently documented on 12/01/2020.  Current medicines are reviewed at length with the patient today.  I have spent 610 minutes dedicated to the care of this patient on the date of this encounter to include pre-visit review of records, assessment, management and diagnostic testing,with shared decision making.  I have also spoken with Dr. Debara Pickett, DOD at [facility] office today who has seen and examined the patient and agrees with need for admission.  Labs/ tests ordered today include: Direct Admission with diagnosis of Acute on Chronic Diastolic CHF.    Phill Myron. West Pugh, ANP, AACC   12/08/2020 12:06 PM    [facility] [address] Office [phone] Fax [phone]  Notice: This dictation was prepared with Dragon dictation along with smaller phrase technology. Any transcriptional errors that result from this process are unintentional and may not be corrected  upon review.

## 2020-12-09 ENCOUNTER — Inpatient Hospital Stay (HOSPITAL_COMMUNITY)
Admission: AD | Admit: 2020-12-09 | Discharge: 2020-12-14 | DRG: 291 | Disposition: A | Discharge status: Home Health | Payer: No Typology Code available for payment source | Source: Ambulatory Visit | Attending: Internal Medicine | Admitting: Internal Medicine

## 2020-12-09 ENCOUNTER — Other Ambulatory Visit: Payer: Self-pay | Admitting: Adult Health

## 2020-12-09 ENCOUNTER — Other Ambulatory Visit: Payer: Self-pay

## 2020-12-09 ENCOUNTER — Encounter (HOSPITAL_COMMUNITY): Payer: Self-pay | Admitting: Internal Medicine

## 2020-12-09 ENCOUNTER — Ambulatory Visit (INDEPENDENT_AMBULATORY_CARE_PROVIDER_SITE_OTHER): Payer: No Typology Code available for payment source | Admitting: Adult Health

## 2020-12-09 ENCOUNTER — Encounter: Payer: Self-pay | Admitting: Adult Health

## 2020-12-09 VITALS — BP 125/68 | HR 67 | Ht 68.0 in | Wt 176.0 lb

## 2020-12-09 DIAGNOSIS — I351 Nonrheumatic aortic (valve) insufficiency: Secondary | ICD-10-CM

## 2020-12-09 DIAGNOSIS — I272 Pulmonary hypertension, unspecified: Secondary | ICD-10-CM | POA: Diagnosis present

## 2020-12-09 DIAGNOSIS — I071 Rheumatic tricuspid insufficiency: Secondary | ICD-10-CM | POA: Diagnosis present

## 2020-12-09 DIAGNOSIS — Z9981 Dependence on supplemental oxygen: Secondary | ICD-10-CM

## 2020-12-09 DIAGNOSIS — Z9114 Patient's other noncompliance with medication regimen: Secondary | ICD-10-CM

## 2020-12-09 DIAGNOSIS — I2510 Atherosclerotic heart disease of native coronary artery without angina pectoris: Secondary | ICD-10-CM | POA: Diagnosis present

## 2020-12-09 DIAGNOSIS — I959 Hypotension, unspecified: Secondary | ICD-10-CM | POA: Diagnosis present

## 2020-12-09 DIAGNOSIS — I480 Paroxysmal atrial fibrillation: Secondary | ICD-10-CM | POA: Diagnosis present

## 2020-12-09 DIAGNOSIS — I4892 Unspecified atrial flutter: Secondary | ICD-10-CM | POA: Diagnosis present

## 2020-12-09 DIAGNOSIS — R001 Bradycardia, unspecified: Secondary | ICD-10-CM | POA: Diagnosis present

## 2020-12-09 DIAGNOSIS — Z20822 Contact with and (suspected) exposure to covid-19: Secondary | ICD-10-CM | POA: Diagnosis present

## 2020-12-09 DIAGNOSIS — J42 Unspecified chronic bronchitis: Secondary | ICD-10-CM | POA: Diagnosis not present

## 2020-12-09 DIAGNOSIS — I5033 Acute on chronic diastolic (congestive) heart failure: Secondary | ICD-10-CM

## 2020-12-09 DIAGNOSIS — I4819 Other persistent atrial fibrillation: Secondary | ICD-10-CM

## 2020-12-09 DIAGNOSIS — J9611 Chronic respiratory failure with hypoxia: Secondary | ICD-10-CM | POA: Diagnosis present

## 2020-12-09 DIAGNOSIS — Z7901 Long term (current) use of anticoagulants: Secondary | ICD-10-CM | POA: Diagnosis not present

## 2020-12-09 DIAGNOSIS — E876 Hypokalemia: Secondary | ICD-10-CM | POA: Diagnosis not present

## 2020-12-09 DIAGNOSIS — J449 Chronic obstructive pulmonary disease, unspecified: Secondary | ICD-10-CM | POA: Diagnosis present

## 2020-12-09 DIAGNOSIS — I4821 Permanent atrial fibrillation: Secondary | ICD-10-CM | POA: Diagnosis not present

## 2020-12-09 DIAGNOSIS — E7800 Pure hypercholesterolemia, unspecified: Secondary | ICD-10-CM

## 2020-12-09 DIAGNOSIS — I509 Heart failure, unspecified: Secondary | ICD-10-CM

## 2020-12-09 DIAGNOSIS — Z955 Presence of coronary angioplasty implant and graft: Secondary | ICD-10-CM | POA: Diagnosis not present

## 2020-12-09 DIAGNOSIS — J90 Pleural effusion, not elsewhere classified: Secondary | ICD-10-CM

## 2020-12-09 DIAGNOSIS — K5900 Constipation, unspecified: Secondary | ICD-10-CM | POA: Diagnosis present

## 2020-12-09 DIAGNOSIS — I340 Nonrheumatic mitral (valve) insufficiency: Secondary | ICD-10-CM | POA: Diagnosis not present

## 2020-12-09 DIAGNOSIS — R0902 Hypoxemia: Secondary | ICD-10-CM

## 2020-12-09 DIAGNOSIS — I110 Hypertensive heart disease with heart failure: Principal | ICD-10-CM | POA: Diagnosis present

## 2020-12-09 DIAGNOSIS — Z91119 Patient's noncompliance with dietary regimen due to unspecified reason: Secondary | ICD-10-CM

## 2020-12-09 DIAGNOSIS — Z823 Family history of stroke: Secondary | ICD-10-CM

## 2020-12-09 DIAGNOSIS — I081 Rheumatic disorders of both mitral and tricuspid valves: Secondary | ICD-10-CM | POA: Diagnosis present

## 2020-12-09 DIAGNOSIS — Z87891 Personal history of nicotine dependence: Secondary | ICD-10-CM

## 2020-12-09 DIAGNOSIS — I5043 Acute on chronic combined systolic (congestive) and diastolic (congestive) heart failure: Secondary | ICD-10-CM | POA: Insufficient documentation

## 2020-12-09 DIAGNOSIS — Z79899 Other long term (current) drug therapy: Secondary | ICD-10-CM

## 2020-12-09 DIAGNOSIS — I4891 Unspecified atrial fibrillation: Secondary | ICD-10-CM

## 2020-12-09 DIAGNOSIS — Z8249 Family history of ischemic heart disease and other diseases of the circulatory system: Secondary | ICD-10-CM

## 2020-12-09 DIAGNOSIS — Z801 Family history of malignant neoplasm of trachea, bronchus and lung: Secondary | ICD-10-CM

## 2020-12-09 DIAGNOSIS — Z800 Family history of malignant neoplasm of digestive organs: Secondary | ICD-10-CM

## 2020-12-09 DIAGNOSIS — I421 Obstructive hypertrophic cardiomyopathy: Secondary | ICD-10-CM | POA: Diagnosis present

## 2020-12-09 DIAGNOSIS — Z825 Family history of asthma and other chronic lower respiratory diseases: Secondary | ICD-10-CM

## 2020-12-09 LAB — BRAIN NATRIURETIC PEPTIDE: B Natriuretic Peptide: 1067.4 pg/mL — ABNORMAL HIGH (ref 0.0–100.0)

## 2020-12-09 LAB — COMPREHENSIVE METABOLIC PANEL
ALT: 16 U/L (ref 0–44)
AST: 23 U/L (ref 15–41)
Albumin: 2.8 g/dL — ABNORMAL LOW (ref 3.5–5.0)
Alkaline Phosphatase: 49 U/L (ref 38–126)
Anion gap: 7 (ref 5–15)
BUN: 39 mg/dL — ABNORMAL HIGH (ref 8–23)
CO2: 41 mmol/L — ABNORMAL HIGH (ref 22–32)
Calcium: 8.9 mg/dL (ref 8.9–10.3)
Chloride: 94 mmol/L — ABNORMAL LOW (ref 98–111)
Creatinine, Ser: 1.36 mg/dL — ABNORMAL HIGH (ref 0.61–1.24)
GFR, Estimated: 54 mL/min — ABNORMAL LOW (ref >60)
Glucose, Bld: 100 mg/dL — ABNORMAL HIGH (ref 70–99)
Potassium: 4.3 mmol/L (ref 3.5–5.1)
Sodium: 142 mmol/L (ref 135–145)
Total Bilirubin: 1 mg/dL (ref 0.3–1.2)
Total Protein: 6.1 g/dL — ABNORMAL LOW (ref 6.5–8.1)

## 2020-12-09 LAB — TSH: TSH: 3.142 u[IU]/mL (ref 0.350–4.500)

## 2020-12-09 LAB — PROTIME-INR
INR: 1.4 — ABNORMAL HIGH (ref 0.8–1.2)
Prothrombin Time: 17 seconds — ABNORMAL HIGH (ref 11.4–15.2)

## 2020-12-09 LAB — APTT: aPTT: 32 seconds (ref 24–36)

## 2020-12-09 LAB — RESP PANEL BY RT-PCR (FLU A&B, COVID) ARPGX2
Influenza A by PCR: NEGATIVE
Influenza B by PCR: NEGATIVE
SARS Coronavirus 2 by RT PCR: NEGATIVE

## 2020-12-09 LAB — MAGNESIUM: Magnesium: 2.5 mg/dL — ABNORMAL HIGH (ref 1.7–2.4)

## 2020-12-09 MED ORDER — SODIUM CHLORIDE 0.9 % IV SOLN: 250.0000 mL | INTRAVENOUS | Status: DC | PRN

## 2020-12-09 MED ORDER — SODIUM CHLORIDE 0.9% FLUSH: 3.0000 mL | INTRAVENOUS | Status: DC | PRN

## 2020-12-09 MED ORDER — SODIUM CHLORIDE 0.9 % IV SOLN
250.0000 mL | INTRAVENOUS | Status: DC | PRN
Start: 2020-12-09 — End: 2020-12-14

## 2020-12-09 MED ORDER — ROSUVASTATIN CALCIUM 5 MG PO TABS
10.0000 mg | ORAL_TABLET | Freq: Every day | ORAL | Status: DC
  Administered 2020-12-09 – 2020-12-13 (×5): 10 mg via ORAL
  Filled 2020-12-09 (×5): qty 2

## 2020-12-09 MED ORDER — TAMSULOSIN HCL 0.4 MG PO CAPS: 0.4000 mg | ORAL_CAPSULE | Freq: Every day | ORAL | Status: DC

## 2020-12-09 MED ORDER — SODIUM CHLORIDE 0.9 % IV SOLN: 4.0000 mg | Freq: Four times a day (QID) | INTRAVENOUS | Status: DC | PRN

## 2020-12-09 MED ORDER — FUROSEMIDE 10 MG/ML IJ SOLN
80.0000 mg | Freq: Two times a day (BID) | INTRAMUSCULAR | Status: DC
Start: 2020-12-09 — End: 2020-12-14
  Administered 2020-12-09 – 2020-12-14 (×10): 80 mg via INTRAVENOUS
  Filled 2020-12-09 (×10): qty 8

## 2020-12-09 MED ORDER — FUROSEMIDE 10 MG/ML IJ SOLN: 80.0000 mg | Freq: Two times a day (BID) | INTRAMUSCULAR | Status: DC

## 2020-12-09 MED ORDER — DOFETILIDE 250 MCG PO CAPS
250.0000 ug | ORAL_CAPSULE | Freq: Two times a day (BID) | ORAL | Status: DC
  Administered 2020-12-09 – 2020-12-14 (×10): 250 ug via ORAL
  Filled 2020-12-09 (×14): qty 1

## 2020-12-09 MED ORDER — ACETAMINOPHEN 325 MG PO TABS: 650.0000 mg | ORAL_TABLET | ORAL | Status: DC | PRN

## 2020-12-09 MED ORDER — APIXABAN 2.5 MG PO TABS: 5.0000 mg | ORAL_TABLET | Freq: Two times a day (BID) | ORAL | Status: DC

## 2020-12-09 MED ORDER — ROSUVASTATIN CALCIUM 5 MG PO TABS: 10.0000 mg | ORAL_TABLET | Freq: Every day | ORAL | Status: DC

## 2020-12-09 MED ORDER — APIXABAN 5 MG PO TABS
5.0000 mg | ORAL_TABLET | Freq: Two times a day (BID) | ORAL | Status: DC
  Administered 2020-12-09 – 2020-12-14 (×10): 5 mg via ORAL
  Filled 2020-12-09 (×10): qty 1

## 2020-12-09 MED ORDER — POTASSIUM CHLORIDE CRYS ER 10 MEQ PO TBCR: 20.0000 meq | EXTENDED_RELEASE_TABLET | Freq: Two times a day (BID) | ORAL | Status: DC

## 2020-12-09 MED ORDER — SODIUM CHLORIDE 0.9% FLUSH: 3.0000 mL | Freq: Two times a day (BID) | INTRAVENOUS | Status: DC

## 2020-12-09 MED ORDER — DOFETILIDE 125 MCG PO CAPS: 250.0000 ug | ORAL_CAPSULE | Freq: Two times a day (BID) | ORAL | Status: DC

## 2020-12-09 MED ORDER — SODIUM CHLORIDE 0.9% FLUSH
3.0000 mL | Freq: Two times a day (BID) | INTRAVENOUS | Status: DC
Start: 2020-12-09 — End: 2020-12-14
  Administered 2020-12-09 – 2020-12-14 (×11): 3 mL via INTRAVENOUS

## 2020-12-09 MED ORDER — PANTOPRAZOLE SODIUM 40 MG PO TBEC
40.0000 mg | DELAYED_RELEASE_TABLET | Freq: Every day | ORAL | Status: DC
  Administered 2020-12-10 – 2020-12-14 (×5): 40 mg via ORAL
  Filled 2020-12-09 (×5): qty 1

## 2020-12-09 MED ORDER — ONDANSETRON HCL 4 MG/2ML IJ SOLN: 4.0000 mg | Freq: Four times a day (QID) | INTRAMUSCULAR | Status: DC | PRN

## 2020-12-10 ENCOUNTER — Inpatient Hospital Stay (HOSPITAL_COMMUNITY): Payer: No Typology Code available for payment source

## 2020-12-10 DIAGNOSIS — I5033 Acute on chronic diastolic (congestive) heart failure: Secondary | ICD-10-CM

## 2020-12-10 LAB — BASIC METABOLIC PANEL
Anion gap: 5 (ref 5–15)
BUN: 36 mg/dL — ABNORMAL HIGH (ref 8–23)
CO2: 44 mmol/L — ABNORMAL HIGH (ref 22–32)
Calcium: 8.4 mg/dL — ABNORMAL LOW (ref 8.9–10.3)
Chloride: 93 mmol/L — ABNORMAL LOW (ref 98–111)
Creatinine, Ser: 1.31 mg/dL — ABNORMAL HIGH (ref 0.61–1.24)
GFR, Estimated: 56 mL/min — ABNORMAL LOW (ref >60)
Glucose, Bld: 99 mg/dL (ref 70–99)
Potassium: 4.1 mmol/L (ref 3.5–5.1)
Sodium: 142 mmol/L (ref 135–145)

## 2020-12-10 NOTE — Evaluation (Signed)
Occupational Therapy Evaluation Patient Details Name: Joshua [name] MRN: [id number] DOB: 08/24/1945 Today's Date: 12/10/2020   History of Present Illness Patient is a 75 y/o male with acute on chronic diastolic heart failure, recently hospitalized for similar, with recent weight gain, hypoxia and significant volume overload.  Admitted for diuresis.  PMH includes PAF, LBBB, CHF, COPD, CAD, HTN.   Clinical Impression   Patient admitted for the diagnosis above.  PTA he lives at home, his daughter lives with him, but works day shift at [employer], and so he is home alone at times.  He states he is independent with ADL/IADL, and walks without an AD at home.  He is dependent on home O2.  Currently he is needing up to [facility] for in room mobility/toileting, and lower body ADL due to instability.  He is not open to SNF, but is willing to return home with [facility] services.  OT will continue to follow in the acute setting maximize functional status.  Patient would benefit from CHF training/education to reduce hospitalizations.          Recommendations for follow up therapy are one component of a multi-disciplinary discharge planning process, led by the attending physician.  Recommendations may be updated based on patient status, additional functional criteria and insurance authorization.   Follow Up Recommendations  Home health OT    Equipment Recommendations  Tub/shower seat    Recommendations for Other Services       Precautions / Restrictions Precautions Precautions: Fall Precaution Comments: Watch O2 Restrictions Weight Bearing Restrictions: No      Mobility Bed Mobility Overal bed mobility: Modified Independent               Patient Response: Cooperative  Transfers Overall transfer level: Needs assistance Equipment used: 1 person hand held assist Transfers: Sit to/from [facility] Sit to Stand: Min guard Stand pivot transfers: Min guard       General transfer  comment: patient did walk to bathroom without RW, decreased balance noted.    Balance Overall balance assessment: Needs assistance Sitting-balance support: Feet supported Sitting balance-Leahy Scale: Normal     Standing balance support: No upper extremity supported Standing balance-Leahy Scale: Fair Standing balance comment: mild LOB noted, patient able to recover, but [employer] with gaitbelt throughout mobility.                           ADL either performed or assessed with clinical judgement   ADL       Grooming: Wash/dry hands;Supervision/safety;Standing       Lower Body Bathing: Min guard;Sit to/from stand       Lower Body Dressing: Min guard;Sit to/from stand   Toilet Transfer: Min guard;Ambulation             General ADL Comments: unsteady on his feet.     Vision Baseline Vision/History: 1 Wears glasses Patient Visual Report: No change from baseline       Perception     Praxis      Pertinent Vitals/Pain Pain Assessment: No/denies pain     Hand Dominance Right   Extremity/Trunk Assessment Upper Extremity Assessment Upper Extremity Assessment: Overall WFL for tasks assessed   Lower Extremity Assessment Lower Extremity Assessment: Defer to PT evaluation   Cervical / Trunk Assessment Cervical / Trunk Assessment: Normal   Communication Communication Communication: HOH   Cognition Arousal/Alertness: Awake/alert Behavior During Therapy: WFL for tasks assessed/performed Overall Cognitive Status: Within  Functional Limits for tasks assessed                                 General Comments: patient does not appear to have a good understanding of CHF and medical management.   General Comments   Desaturated on RA to 78% for bathroom use, but rebounded quickly to 92% with O2 returned.       Exercises     Shoulder Instructions      Home Living Family/patient expects to be discharged to:: Private residence Living  Arrangements: Children Available Help at Discharge: Family;Available PRN/intermittently Type of Home: Mobile home Home Access: Level entry     Home Layout: One level     Bathroom Shower/Tub: Occupational psychologist: Standard Bathroom Accessibility: No   Home Equipment: Clinical cytogeneticist - 2 wheels          Prior Functioning/Environment Level of Independence: Independent        Comments: Does ADLs/IADLs. Wears 3L 02 [place]. no falls. Cooks. Per recent admit note: Daughter is not much help per visitor in room.  She works at [employer], [place], and he is home alone.  Drives, manages bills and meds.        OT Problem List: Impaired balance (sitting and/or standing);Decreased activity tolerance;Decreased strength;Decreased safety awareness      OT Treatment/Interventions: Self-care/ADL training;Therapeutic exercise;Therapeutic activities;Patient/family education;Balance training    OT Goals(Current goals can be found in the care plan section) Acute Rehab OT Goals Patient Stated Goal: to go home OT Goal Formulation: With patient Time For Goal Achievement: 12/24/20 Potential to Achieve Goals: Good ADL Goals Pt Will Perform Grooming: with modified independence;standing Pt Will Perform Lower Body Bathing: with modified independence;sit to/from stand Pt Will Perform Lower Body Dressing: with modified independence;sit to/from stand Pt Will Transfer to Toilet: with modified independence;ambulating;regular height toilet Pt/caregiver will Perform Home Exercise Program: Increased strength;Both right and left upper extremity;With theraband;With Supervision;With written HEP provided  OT Frequency: Min 2X/week   Barriers to D/C: Decreased caregiver support          Co-evaluation              AM-PAC OT "6 Clicks" Daily Activity     Outcome Measure Help from another person eating meals?: None Help from another person taking care of personal grooming?: None Help from  another person toileting, which includes using toliet, bedpan, or urinal?: A Little Help from another person bathing (including washing, rinsing, drying)?: A Little Help from another person to put on and taking off regular upper body clothing?: None Help from another person to put on and taking off regular lower body clothing?: A Little 6 Click Score: 21   End of Session Equipment Utilized During Treatment: Gait belt;Oxygen Nurse Communication: Mobility status  Activity Tolerance: Patient tolerated treatment well Patient left: in chair;with call bell/phone within reach;with chair alarm set  OT Visit Diagnosis: Unsteadiness on feet (R26.81)                Time: 6226-3335 OT Time Calculation (min): 27 min Charges:  OT General Charges $OT Visit: 1 Visit OT Evaluation $OT Eval Moderate Complexity: 1 Mod OT Treatments $Self Care/Home Management : 8-22 mins  12/10/2020  RP, OTR/L  Acute Rehabilitation Services  Office:  [phone]   Metta Clines 12/10/2020, 11:22 AM

## 2020-12-10 NOTE — Progress Notes (Signed)
Progress Note  Patient Name: Joshua [name] Date of Encounter: 12/10/2020  Primary Cardiologist: Shelva Majestic, MD   Subjective   Patient feels slightly better than yesterday, less winded.  I/O were not charted overnight as patient was getting up to urinate. Inaccurate thus far, discussed with Palma Holter RN who will chart strictly going forward.   Inpatient Medications    Scheduled Meds:  apixaban  5 mg Oral BID   dofetilide  250 mcg Oral BID   furosemide  80 mg Intravenous BID   pantoprazole  40 mg Oral Daily   rosuvastatin  10 mg Oral QHS   sodium chloride flush  3 mL Intravenous Q12H   Continuous Infusions:  sodium chloride     PRN Meds: sodium chloride, acetaminophen, ondansetron (ZOFRAN) IV, sodium chloride flush   Vital Signs    Vitals:   12/10/20 0359 12/10/20 0526 12/10/20 0733 12/10/20 0747  BP: (!) 130/56  (!) 111/52 (!) 106/50  Pulse: 67  63 64  Resp: 18  18 17   Temp: 97.7 F (36.5 C)  98.2 F (36.8 C) (!) 97.4 F (36.3 C)  TempSrc: Oral  Oral Oral  SpO2: 99%  95% 93%  Weight:  77.9 kg    Height:        Intake/Output Summary (Last 24 hours) at 12/10/2020 1119 Last data filed at 12/10/2020 0800 Gross per 24 hour  Intake 960 ml  Output 400 ml  Net 560 ml   Filed Weights   12/09/20 1315 12/10/20 0526  Weight: 78.1 kg 77.9 kg    Telemetry    Atrial flutter with slow ventricular response, HR 55 - Personally Reviewed  ECG     Atrial flutter with slow ventricular response, HR 55  - Personally Reviewed  Physical Exam   GEN: No acute distress.   Neck: JVD to mid 1/3 of neck Cardiac: irregular rhythm, normal rate. Across the precordium there are 3/6 systolic and diastolic murmurs.  Respiratory: crackles bilaterally GI: Soft, nontender, non-distended  MS: 1-2+ edema in ankles, to shins; No deformity. Neuro:  Nonfocal  Psych: Normal affect   Labs    Chemistry Recent Labs  Lab 12/09/20 1418 12/10/20 0121  NA 142 142  K 4.3 4.1  CL 94*  93*  CO2 41* 44*  GLUCOSE 100* 99  BUN 39* 36*  CREATININE 1.36* 1.31*  CALCIUM 8.9 8.4*  PROT 6.1*  --   ALBUMIN 2.8*  --   AST 23  --   ALT 16  --   ALKPHOS 49  --   BILITOT 1.0  --   GFRNONAA 54* 56*  ANIONGAP 7 5     HematologyNo results for input(s): WBC, RBC, HGB, HCT, MCV, MCH, MCHC, RDW, PLT in the last 168 hours.  Cardiac EnzymesNo results for input(s): TROPONINI in the last 168 hours. No results for input(s): TROPIPOC in the last 168 hours.   BNP Recent Labs  Lab 12/09/20 1418  BNP 1,067.4*     DDimer No results for input(s): DDIMER in the last 168 hours.   Radiology    No results found.  Cardiac Studies   None this admission  Patient Profile     75 y.o. male hx of HFpEF, CAD s/p prior PCI to LAD + RCA, HCM sigmoid morphology, moderate MR, moderate to severe MS, moderate to severe AI, moderate to severe TR, persistent atrial fibrillation/flutter, essential hypertension, COPD on home O2, who is admitted for the evaluation of acute on chronic heart failure  with preserved ejection fraction.   Assessment & Plan   Active Problems:   Acute on chronic diastolic CHF (congestive heart failure) ([place])    Very complex cardiac situation. Not a candidate for surgery which patient is aware of.  - presents with decompensated HFpEF, complicated by multivalve disease, HCM and afib/flutter. - Concern raised in office for home noncompliance perhaps due to difficulty caring for himself at home. Was previously DNR at hospital dismissal recently, on admission wishes to be full code. Will need SW and PT/OT to evaluate patient for safety to return home. - will diurese with lasix 80 mg IV BID. Symptomatically improving but I/O inaccurate.  - continue eliquis and tikosyn for afib/flutter. - can continue crestor.    For questions or updates, please contact [place] Please consult [email or web address] for contact info under        Signed, Elouise Munroe, MD  12/10/2020,  11:19 AM

## 2020-12-10 NOTE — Evaluation (Signed)
Physical Therapy Evaluation Patient Details Name: Joshua [name] MRN: [id number] DOB: 01-30-1946 Today's Date: 12/10/2020  History of Present Illness  Patient is a 75 y/o male with acute on chronic diastolic heart failure, recently hospitalized for similar, with recent weight gain, hypoxia and significant volume overload.  Admitted for diuresis.  PMH includes PAF, LBBB, CHF, COPD, CAD, HTN.  Clinical Impression  PTA pt living with daughter in single story home with level entry. Pt reports daughter works and he spends time alone during the day, however has family nearby if he needs them. Pt reports using 3L O2 via [place] at baseline and is able to ambulate without AD although has RW if he needs it. Pt also reports independence with bADLs and iADLs. Pt is currently limited in safe mobility by decreased awareness of his deficits, pt also exhibits generalized weakness in his LE and related decreased balance in ambulation. Pt is mod I for bed mobility and min guard for transfers and ambulation without AD. PT recommends HHPT to work on strength and balance for safe navigation in the community. PT will continue to follow acutely.      Recommendations for follow up therapy are one component of a multi-disciplinary discharge planning process, led by the attending physician.  Recommendations may be updated based on patient status, additional functional criteria and insurance authorization.  Follow Up Recommendations Supervision - Intermittent;Home health PT    Equipment Recommendations  None recommended by PT    Recommendations for Other Services       Precautions / Restrictions Precautions Precautions: Fall Precaution Comments: Watch O2 Restrictions Weight Bearing Restrictions: No      Mobility  Bed Mobility Overal bed mobility: Modified Independent         Sit to supine: Modified independent (Device/Increase time);HOB elevated   General bed mobility comments: use of bed rails to pull back  into bed    Transfers Overall transfer level: Needs assistance Equipment used: 1 person hand held assist Transfers: Sit to/from [employer] Transfers Sit to Stand: Min guard Stand pivot transfers: Min guard       General transfer comment: min guard for safety  Ambulation/Gait Ambulation/Gait assistance: Min guard Gait Distance (Feet): 300 Feet Assistive device: Rolling walker (2 wheeled) Gait Pattern/deviations: Step-through pattern;Decreased stride length;Scissoring Gait velocity: decr Gait velocity interpretation: <1.8 ft/sec, indicate of risk for recurrent falls General Gait Details: min guard for safety, multiple scissoring missteps with ambulation which pt able to self correct without physical assist, pt with attempt to make light of instability, pt with complaints of LE stiffness from sitting and 2/4 DoE in final 50 feet of ambulation, RN watching telemetry in hallway noted short run of VTach per monitor HR in 70s      Balance Overall balance assessment: Needs assistance Sitting-balance support: Feet supported Sitting balance-Leahy Scale: Normal     Standing balance support: No upper extremity supported Standing balance-Leahy Scale: Fair Standing balance comment: x3 mild LoB with ambulation                             Pertinent Vitals/Pain Pain Assessment: No/denies pain    Home Living Family/patient expects to be discharged to:: Private residence Living Arrangements: Children Available Help at Discharge: Family;Available PRN/intermittently Type of Home: Mobile home Home Access: Level entry     Home Layout: One level Home Equipment: Clinical cytogeneticist - 2 wheels      Prior Function Level of Independence: Independent  Comments: requires 3L O2 via [place], has bag concentrator, does not use an AD for mobility, daughter lives with him but works during the day when he is left alone, does ADLs and iADLs     Hand Dominance   Dominant  Hand: Right    Extremity/Trunk Assessment   Upper Extremity Assessment Upper Extremity Assessment: Defer to OT evaluation    Lower Extremity Assessment Lower Extremity Assessment: Generalized weakness    Cervical / Trunk Assessment Cervical / Trunk Assessment: Normal  Communication   Communication: HOH  Cognition Arousal/Alertness: Awake/alert Behavior During Therapy: WFL for tasks assessed/performed Overall Cognitive Status: Within Functional Limits for tasks assessed                                 General Comments: patient does not appear to have a good understanding of CHF and medical management.      General Comments General comments (skin integrity, edema, etc.): 2/4 DoE in final 50 feet of ambulation, RN watching telemetry in hallway noted short run of VTach per monitor HR in 70s        Assessment/Plan    PT Assessment Patient needs continued PT services  PT Problem List Decreased strength;Decreased mobility;Cardiopulmonary status limiting activity;Decreased activity tolerance       PT Treatment Interventions Therapeutic exercise;Gait training;Patient/family education;Therapeutic activities;Functional mobility training;Balance training    PT Goals (Current goals can be found in the Care Plan section)  Acute Rehab PT Goals Patient Stated Goal: to go home PT Goal Formulation: With patient Time For Goal Achievement: 12/12/20 Potential to Achieve Goals: Good    Frequency Min 3X/week   Barriers to discharge Decreased caregiver support         AM-PAC PT "6 Clicks" Mobility  Outcome Measure Help needed turning from your back to your side while in a flat bed without using bedrails?: None Help needed moving from lying on your back to sitting on the side of a flat bed without using bedrails?: None Help needed moving to and from a bed to a chair (including a wheelchair)?: None Help needed standing up from a chair using your arms (e.g., wheelchair or  bedside chair)?: None Help needed to walk in hospital room?: A Little Help needed climbing 3-5 steps with a railing? : A Little 6 Click Score: 22    End of Session Equipment Utilized During Treatment: Gait belt;Oxygen Activity Tolerance: Patient tolerated treatment well Patient left: in bed;with bed alarm set;with call bell/phone within reach Nurse Communication: Mobility status PT Visit Diagnosis: Muscle weakness (generalized) (M62.81)    Time: 5400-8676 PT Time Calculation (min) (ACUTE ONLY): 23 min   Charges:   PT Evaluation $PT Eval Moderate Complexity: 1 Mod PT Treatments $Gait Training: 8-22 mins        Doy Taaffe B. Migdalia Dk PT, DPT Acute Rehabilitation Services Pager [phone] Office [phone]   [place] 12/10/2020, 2:22 PM

## 2020-12-11 LAB — BASIC METABOLIC PANEL
Anion gap: 6 (ref 5–15)
BUN: 33 mg/dL — ABNORMAL HIGH (ref 8–23)
CO2: 45 mmol/L — ABNORMAL HIGH (ref 22–32)
Calcium: 8.3 mg/dL — ABNORMAL LOW (ref 8.9–10.3)
Chloride: 90 mmol/L — ABNORMAL LOW (ref 98–111)
Creatinine, Ser: 1.44 mg/dL — ABNORMAL HIGH (ref 0.61–1.24)
GFR, Estimated: 50 mL/min — ABNORMAL LOW (ref >60)
Glucose, Bld: 108 mg/dL — ABNORMAL HIGH (ref 70–99)
Potassium: 3.9 mmol/L (ref 3.5–5.1)
Sodium: 141 mmol/L (ref 135–145)

## 2020-12-11 LAB — CBC
HCT: 44.2 % (ref 39.0–52.0)
Hemoglobin: 13 g/dL (ref 13.0–17.0)
MCH: 31.3 pg (ref 26.0–34.0)
MCHC: 29.4 g/dL — ABNORMAL LOW (ref 30.0–36.0)
MCV: 106.5 fL — ABNORMAL HIGH (ref 80.0–100.0)
Platelets: 125 10*3/uL — ABNORMAL LOW (ref 150–400)
RBC: 4.15 MIL/uL — ABNORMAL LOW (ref 4.22–5.81)
RDW: 14.6 % (ref 11.5–15.5)
WBC: 8.7 10*3/uL (ref 4.0–10.5)
nRBC: 0 % (ref 0.0–0.2)

## 2020-12-11 LAB — GLUCOSE, CAPILLARY
Glucose-Capillary: 91 mg/dL (ref 70–99)
Glucose-Capillary: 135 mg/dL — ABNORMAL HIGH (ref 70–99)

## 2020-12-11 LAB — MAGNESIUM: Magnesium: 2.3 mg/dL (ref 1.7–2.4)

## 2020-12-11 MED ORDER — FUROSEMIDE 10 MG/ML IJ SOLN
80.0000 mg | Freq: Once | INTRAMUSCULAR | Status: AC
  Administered 2020-12-11: 80 mg via INTRAVENOUS
  Filled 2020-12-11: qty 8

## 2020-12-11 MED ORDER — POTASSIUM CHLORIDE CRYS ER 20 MEQ PO TBCR
40.0000 meq | EXTENDED_RELEASE_TABLET | Freq: Once | ORAL | Status: AC
  Administered 2020-12-11: 40 meq via ORAL
  Filled 2020-12-11: qty 2

## 2020-12-11 NOTE — Progress Notes (Signed)
Mobility Specialist Progress Note:   12/11/20 1600  Mobility  Activity Ambulated in hall  Level of Assistance Standby assist, set-up cues, supervision of patient - no hands on  Assistive Device Front wheel walker  Distance Ambulated (ft) 216 ft  Mobility Ambulated with assistance in hallway  Mobility Response Tolerated well  Mobility performed by Mobility specialist  Bed Position Chair  $Mobility charge 1 Mobility   Pre- Mobility: 59 HR; 100% SpO2 Post Mobility: 70  HR; 98% SpO2  Pt received in chair willing to participate in mobility. No complaints of pain and asymptomatic throughout ambulation. Returned to chair for dinner with call bell in reach and all needs met.   [facility] Health and safety inspector Phone [phone]

## 2020-12-11 NOTE — Progress Notes (Signed)
Progress Note  Patient Name: Joshua [name] Date of Encounter: 12/11/2020  Primary Cardiologist: Shelva Majestic, MD   Subjective   Patient feels slightly better than yesterday, less winded.  I/O were not charted strictly initially, as patient was getting up to urinate. Inpatient Medications    Scheduled Meds:  apixaban  5 mg Oral BID   dofetilide  250 mcg Oral BID   furosemide  80 mg Intravenous BID   pantoprazole  40 mg Oral Daily   rosuvastatin  10 mg Oral QHS   sodium chloride flush  3 mL Intravenous Q12H   Continuous Infusions:  sodium chloride     PRN Meds: sodium chloride, acetaminophen, ondansetron (ZOFRAN) IV, sodium chloride flush   Vital Signs    Vitals:   12/11/20 0322 12/11/20 0500 12/11/20 0739 12/11/20 1113  BP: (!) 117/57  (!) 109/43 (!) 112/44  Pulse: 65  65 66  Resp: (!) 30  18 20   Temp: 97.6 F (36.4 C)  97.7 F (36.5 C) 97.9 F (36.6 C)  TempSrc: Axillary  Oral Oral  SpO2: 95%  90% 96%  Weight:  77.9 kg    Height:        Intake/Output Summary (Last 24 hours) at 12/11/2020 1238 Last data filed at 12/11/2020 1100 Gross per 24 hour  Intake 963 ml  Output 1950 ml  Net -987 ml   Filed Weights   12/09/20 1315 12/10/20 0526 12/11/20 0500  Weight: 78.1 kg 77.9 kg 77.9 kg    Telemetry    Atrial flutter with slow ventricular response, HR 60s - Personally Reviewed  ECG     Atrial flutter with variable av block, lateral infarct pattern. - Personally Reviewed  Physical Exam   GEN: No acute distress.   Neck: JVD to mid 1/3 of neck Cardiac: irregular rhythm, normal rate. Across the precordium there are 3/6 systolic and diastolic murmurs.  Respiratory: crackles bilaterally GI: Soft, nontender, non-distended  MS: 1+ edema in ankles; No deformity.Venous stasis changes to skin. Neuro:  Nonfocal  Psych: Normal affect   Labs    Chemistry Recent Labs  Lab 12/09/20 1418 12/10/20 0121 12/11/20 0116  NA 142 142 141  K 4.3 4.1 3.9  CL 94*  93* 90*  CO2 41* 44* 45*  GLUCOSE 100* 99 108*  BUN 39* 36* 33*  CREATININE 1.36* 1.31* 1.44*  CALCIUM 8.9 8.4* 8.3*  PROT 6.1*  --   --   ALBUMIN 2.8*  --   --   AST 23  --   --   ALT 16  --   --   ALKPHOS 49  --   --   BILITOT 1.0  --   --   GFRNONAA 54* 56* 50*  ANIONGAP 7 5 6      Hematology Recent Labs  Lab 12/11/20 0116  WBC 8.7  RBC 4.15*  HGB 13.0  HCT 44.2  MCV 106.5*  MCH 31.3  MCHC 29.4*  RDW 14.6  PLT 125*    Cardiac EnzymesNo results for input(s): TROPONINI in the last 168 hours. No results for input(s): TROPIPOC in the last 168 hours.   BNP Recent Labs  Lab 12/09/20 1418  BNP 1,067.4*     DDimer No results for input(s): DDIMER in the last 168 hours.   Radiology    DG Chest Port 1 View  Result Date: 12/10/2020 CLINICAL DATA:  Shortness of breath EXAM: PORTABLE CHEST 1 VIEW COMPARISON:  Chest x-rays dated 11/21/2020 and 05/11/2019. FINDINGS: Chronic  bibasilar opacities without significant change. No new lung findings. No evidence of pleural effusion or pneumothorax is seen. Heart size and mediastinal contours are stable. Osseous structures about the chest are unremarkable. IMPRESSION: 1. No active disease. No evidence of pneumonia or pulmonary edema. 2. Stable chronic bibasilar opacities, likely chronic atelectasis and/or scarring. Electronically Signed   By: Franki Cabot M.D.   On: 12/10/2020 12:10    Cardiac Studies   None this admission  Patient Profile     75 y.o. male hx of HFpEF, CAD s/p prior PCI to LAD + RCA, HCM sigmoid morphology, moderate MR, moderate to severe MS, moderate to severe AI, moderate to severe TR, persistent atrial fibrillation/flutter, essential hypertension, COPD on home O2, who is admitted for the evaluation of acute on chronic heart failure with preserved ejection fraction.   Assessment & Plan   Active Problems:   Acute on chronic diastolic CHF (congestive heart failure) ([place])    Very complex cardiac situation, see  prior admission. Not a candidate for surgery which patient is aware of.  - presents with decompensated HFpEF, complicated by multivalve disease, HCM and afib/flutter. - Concern raised in office for home noncompliance perhaps due to difficulty caring for himself at home. Was previously DNR at hospital dismissal recently, on admission wishes to be full code. Will need SW and PT/OT to evaluate patient for safety to return home. Prelim PTOT eval appropriate for home health, however awaiting case management review.  - continue eliquis and tikosyn for afib/flutter. - can continue crestor.  - modest diuresis -1L neg daily and -454mL total for admission. Weight is 77.9 kg compared to 76.1 kg at dismissal from hosp about 2 weeks ago. May need increased diuresis, however he is slowly improving. Renal function slightly worse today, will see trend tomorrow. Could consider lasix TID if response remains suboptimal. Will give one additional dose now in addition to lasix 80 mg IV BID and see if this helps diuresis.   For questions or updates, please contact [place] Please consult [email or web address] for contact info under        Signed, Elouise Munroe, MD  12/11/2020, 12:38 PM

## 2020-12-12 DIAGNOSIS — I351 Nonrheumatic aortic (valve) insufficiency: Secondary | ICD-10-CM

## 2020-12-12 DIAGNOSIS — Z9981 Dependence on supplemental oxygen: Secondary | ICD-10-CM

## 2020-12-12 DIAGNOSIS — I272 Pulmonary hypertension, unspecified: Secondary | ICD-10-CM

## 2020-12-12 DIAGNOSIS — I340 Nonrheumatic mitral (valve) insufficiency: Secondary | ICD-10-CM

## 2020-12-12 LAB — CBC
HCT: 44.3 % (ref 39.0–52.0)
Hemoglobin: 13 g/dL (ref 13.0–17.0)
MCH: 31.6 pg (ref 26.0–34.0)
MCHC: 29.3 g/dL — ABNORMAL LOW (ref 30.0–36.0)
MCV: 107.8 fL — ABNORMAL HIGH (ref 80.0–100.0)
Platelets: 117 10*3/uL — ABNORMAL LOW (ref 150–400)
RBC: 4.11 MIL/uL — ABNORMAL LOW (ref 4.22–5.81)
RDW: 14.5 % (ref 11.5–15.5)
WBC: 8.5 10*3/uL (ref 4.0–10.5)
nRBC: 0 % (ref 0.0–0.2)

## 2020-12-12 LAB — BASIC METABOLIC PANEL
BUN: 27 mg/dL — ABNORMAL HIGH (ref 8–23)
CO2: >45 mmol/L — ABNORMAL HIGH (ref 22–32)
Calcium: 8.1 mg/dL — ABNORMAL LOW (ref 8.9–10.3)
Chloride: 88 mmol/L — ABNORMAL LOW (ref 98–111)
Creatinine, Ser: 1.41 mg/dL — ABNORMAL HIGH (ref 0.61–1.24)
GFR, Estimated: 52 mL/min — ABNORMAL LOW (ref >60)
Glucose, Bld: 120 mg/dL — ABNORMAL HIGH (ref 70–99)
Potassium: 3.8 mmol/L (ref 3.5–5.1)
Sodium: 140 mmol/L (ref 135–145)

## 2020-12-12 NOTE — Progress Notes (Signed)
Progress Note  Patient Name: Joshua [name] Date of Encounter: 12/12/2020  [facility] Cardiologist: Shelva Majestic, MD   Subjective   No complaints  Inpatient Medications    Scheduled Meds:  apixaban  5 mg Oral BID   dofetilide  250 mcg Oral BID   furosemide  80 mg Intravenous BID   pantoprazole  40 mg Oral Daily   rosuvastatin  10 mg Oral QHS   sodium chloride flush  3 mL Intravenous Q12H   Continuous Infusions:  sodium chloride     PRN Meds: sodium chloride, acetaminophen, ondansetron (ZOFRAN) IV, sodium chloride flush   Vital Signs    Vitals:   12/11/20 2330 12/12/20 0450 12/12/20 0802 12/12/20 1054  BP: 137/70  (!) 123/51 (!) 128/49  Pulse: 60  63 62  Resp: 20  (!) 21 20  Temp: 98 F (36.7 C)  98 F (36.7 C)   TempSrc: Oral  Oral   SpO2: 100%  96% 100%  Weight:  76.8 kg    Height:        Intake/Output Summary (Last 24 hours) at 12/12/2020 1319 Last data filed at 12/12/2020 0802 Gross per 24 hour  Intake 3 ml  Output 1480 ml  Net -1477 ml   Last 3 Weights 12/12/2020 12/11/2020 12/10/2020  Weight (lbs) 169 lb 5 oz 171 lb 11.8 oz 171 lb 11.8 oz  Weight (kg) 76.8 kg 77.9 kg 77.9 kg      Telemetry    AFib, rate controlled - Personally Reviewed  ECG    AFib - Personally Reviewed  Physical Exam   GEN: No acute distress.  Hard of hearing Neck: No JVD Cardiac: irregularly irregular, 3/6 systolic murmur, rubs, or gallops.  Respiratory: Clear to auscultation bilaterally. GI: Soft, nontender, non-distended  MS: 1+ bilateral pitting ankle edema; No deformity. Neuro:  Nonfocal  Psych: Normal affect   Labs    High Sensitivity Troponin:  No results for input(s): TROPONINIHS in the last 720 hours.   Chemistry Recent Labs  Lab 12/09/20 1418 12/10/20 0121 12/11/20 0116 12/12/20 0042  NA 142 142 141 140  K 4.3 4.1 3.9 3.8  CL 94* 93* 90* 88*  CO2 41* 44* 45* >45*  GLUCOSE 100* 99 108* 120*  BUN 39* 36* 33* 27*  CREATININE 1.36* 1.31*  1.44* 1.41*  CALCIUM 8.9 8.4* 8.3* 8.1*  MG 2.5*  --  2.3  --   PROT 6.1*  --   --   --   ALBUMIN 2.8*  --   --   --   AST 23  --   --   --   ALT 16  --   --   --   ALKPHOS 49  --   --   --   BILITOT 1.0  --   --   --   GFRNONAA 54* 56* 50* 52*  ANIONGAP 7 5 6  NOT CALCULATED    Lipids No results for input(s): CHOL, TRIG, HDL, LABVLDL, LDLCALC, CHOLHDL in the last 168 hours.  Hematology Recent Labs  Lab 12/11/20 0116 12/12/20 0042  WBC 8.7 8.5  RBC 4.15* 4.11*  HGB 13.0 13.0  HCT 44.2 44.3  MCV 106.5* 107.8*  MCH 31.3 31.6  MCHC 29.4* 29.3*  RDW 14.6 14.5  PLT 125* 117*   Thyroid  Recent Labs  Lab 12/09/20 1418  TSH 3.142    BNP Recent Labs  Lab 12/09/20 1418  BNP 1,067.4*    DDimer No results for input(s):  DDIMER in the last 168 hours.   Radiology    No results found.  Cardiac Studies   Normal LVEF in 09/2020.  Patient Profile     75 y.o. male with valvuar heart disease (MR, AI), pulm HTN, chronic diastolic heart failure  Assessment & Plan    Continue with diuresis.  Renal function stable.  Compliance with meds will be key along with home health support.  Will try to see if we can have additional resources to help him at home   Wean oxygen to baseline 2L [place] which he had at home.  He feels he is getting to baseline.  Spoke to daughter.  He was supposed to be getting home health, but daughter states he is stubborn- he was refusing the help.   Would have discharge planners call daughter Carina at [phone] to help with discharge planning.  I spoke at length with her.  Daughter cannot be there everyday.  He may not be taking his medicines as prescribed as she noted he is having some memory issues.   For questions or updates, please contact [place] Please consult [email or web address] for contact info under        Signed, Larae Grooms, MD  12/12/2020, 1:19 PM

## 2020-12-12 NOTE — Progress Notes (Signed)
Mobility Specialist Progress Note:   12/12/20 1649  Mobility  Activity Ambulated in hall  Level of Assistance Standby assist, set-up cues, supervision of patient - no hands on  Assistive Device Front wheel walker  Distance Ambulated (ft) 216 ft  Mobility Ambulated with assistance in hallway  Mobility Response Tolerated well  Mobility performed by Mobility specialist  Bed Position Chair  $Mobility charge 1 Mobility   Pt received EOB and willing to participate in mobility. No complaints of pain and asymptomatic throughout ambulation. Returned to chair with call bell in reach and all needs met.     Mobility Specialist Phone [phone]    

## 2020-12-13 DIAGNOSIS — E876 Hypokalemia: Secondary | ICD-10-CM

## 2020-12-13 DIAGNOSIS — I4821 Permanent atrial fibrillation: Secondary | ICD-10-CM

## 2020-12-13 LAB — CBC
HCT: 41 % (ref 39.0–52.0)
Hemoglobin: 11.9 g/dL — ABNORMAL LOW (ref 13.0–17.0)
MCH: 31.3 pg (ref 26.0–34.0)
MCHC: 29 g/dL — ABNORMAL LOW (ref 30.0–36.0)
MCV: 107.9 fL — ABNORMAL HIGH (ref 80.0–100.0)
Platelets: 98 10*3/uL — ABNORMAL LOW (ref 150–400)
RBC: 3.8 MIL/uL — ABNORMAL LOW (ref 4.22–5.81)
RDW: 14.2 % (ref 11.5–15.5)
WBC: 6.9 10*3/uL (ref 4.0–10.5)
nRBC: 0 % (ref 0.0–0.2)

## 2020-12-13 LAB — BASIC METABOLIC PANEL
BUN: 22 mg/dL (ref 8–23)
CO2: >45 mmol/L — ABNORMAL HIGH (ref 22–32)
Calcium: 7.8 mg/dL — ABNORMAL LOW (ref 8.9–10.3)
Chloride: 84 mmol/L — ABNORMAL LOW (ref 98–111)
Creatinine, Ser: 1.16 mg/dL (ref 0.61–1.24)
GFR, Estimated: >60 mL/min (ref >60)
Glucose, Bld: 96 mg/dL (ref 70–99)
Potassium: 3.2 mmol/L — ABNORMAL LOW (ref 3.5–5.1)
Sodium: 141 mmol/L (ref 135–145)

## 2020-12-13 MED ORDER — SPIRONOLACTONE 12.5 MG HALF TABLET
12.5000 mg | ORAL_TABLET | Freq: Every day | ORAL | Status: DC
  Administered 2020-12-13 – 2020-12-14 (×2): 12.5 mg via ORAL
  Filled 2020-12-13 (×2): qty 1

## 2020-12-13 MED ORDER — POTASSIUM CHLORIDE CRYS ER 20 MEQ PO TBCR
40.0000 meq | EXTENDED_RELEASE_TABLET | ORAL | Status: AC
  Administered 2020-12-13 (×2): 40 meq via ORAL
  Filled 2020-12-13 (×2): qty 2

## 2020-12-13 MED ORDER — ALBUTEROL SULFATE (2.5 MG/3ML) 0.083% IN NEBU
2.5000 mg | INHALATION_SOLUTION | Freq: Four times a day (QID) | RESPIRATORY_TRACT | Status: DC | PRN
  Administered 2020-12-14: 2.5 mg via RESPIRATORY_TRACT
  Filled 2020-12-13: qty 3

## 2020-12-13 NOTE — Progress Notes (Signed)
Heart Failure Navigation Team Progress Note  PCP: [name], [name], MD Primary Cardiologist: Troy Sine, MD Admitted from: home  Past Medical History:  Diagnosis Date   Acute lower GI bleeding    related to gastritis / viral infection   Cataract    Cirrhosis ([place])    Congestive heart failure (CHF) ([place]) 11/21/2020   COPD (chronic obstructive pulmonary disease) ([place])    PFT 05-05-2010, FEV1 .9 (26%) ratio 60   Coronary artery disease    Gastritis 06-10-2006   Hiatal hernia 06-10-2006   EGD   HOH (hard of hearing)    Hx of adenomatous colonic polyps 2005, 2013   Colonoscopy   Hypercholesteremia    Hypertension    Hypertr obst cardiomyop    mild subaortic stenosis   Iron deficiency anemia, unspecified    LVH (left ventricular hypertrophy)    Other B-complex deficiencies    Persistent atrial fibrillation ([place])    persistent   Rheumatic fever    Thrombocytopenia ([place]) 05/2008    Social History   Socioeconomic History   Marital status: Widowed    Spouse name: Not on file   Number of children: 5   Years of education: Not on file   Highest education level: Not on file  Occupational History   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  Tobacco Use   Smoking status: Former    Packs/day: 1.00    Years: 10.00    Pack years: 10.00    Types: Cigarettes    Quit date: 02/26/1985    Years since quitting: 35.8   Smokeless tobacco: Never  Vaping Use   Vaping Use: Never used  Substance and Sexual Activity   Alcohol use: No   Drug use: No   Sexual activity: Not on file  Other Topics Concern   Not on file  Social History Narrative   Daughter lives with him   Social Determinants of Health   Financial Resource Strain: Low Risk    Difficulty of Paying Living Expenses: Not hard at all  Food Insecurity: No Food Insecurity   Worried About Charity fundraiser in the Last Year: Never true   Arboriculturist in the Last Year: Never true  Transportation Needs:  No Transportation Needs   Lack of Transportation (Medical): No   Lack of Transportation (Non-Medical): No  Physical Activity: Sufficiently Active   Days of Exercise per Week: 7 days   Minutes of Exercise per Session: 30 min  Stress: No Stress Concern Present   Feeling of Stress : Not at all  Social Connections: Moderately Integrated   Frequency of Communication with Friends and Family: More than three times a week   Frequency of Social Gatherings with Friends and Family: More than three times a week   Attends Religious Services: More than 4 times per year   Active Member of [employer] or Organizations: Yes   Attends Archivist Meetings: More than 4 times per year   Marital Status: Widowed     Heart & Vascular Transition of Care Clinic follow-up: Scheduled for pending.  Confirmed transportation.  Immediate social needs: None identified as difficult speaking with patient due to Mr. Wickey being hard of hearing.   Susanne Baumgarner, MSW, [place] Heart Failure Social Worker

## 2020-12-13 NOTE — Progress Notes (Signed)
Occupational Therapy Treatment Patient Details Name: Joshua [name] MRN: [id number] DOB: 1945-09-02 Today's Date: 12/13/2020   History of present illness Patient is a 75 y/o male with acute on chronic diastolic heart failure, recently hospitalized for similar, with recent weight gain, hypoxia and significant volume overload.  Admitted for diuresis.  PMH includes PAF, LBBB, CHF, COPD, CAD, HTN.   OT comments  Patient received in recliner. Nursing states patient's O2 sats have dropped in 70s when in bed and appears to normalize in recliner.  Patient began having nose bleeds before getting out of recliner and nursing addressed with adding humidifier to provide moisture. Patient was able to ambulate to sink and perform grooming. Upon returning to recliner O2 dropped to low 80's and required verbal cues for breathing technique. Acute OT to continue to follow.    Recommendations for follow up therapy are one component of a multi-disciplinary discharge planning process, led by the attending physician.  Recommendations may be updated based on patient status, additional functional criteria and insurance authorization.    Follow Up Recommendations  Home health OT    Equipment Recommendations  Tub/shower seat    Recommendations for Other Services      Precautions / Restrictions Precautions Precautions: Fall Precaution Comments: Watch O2 Restrictions Weight Bearing Restrictions: No       Mobility Bed Mobility Overal bed mobility: Modified Independent             General bed mobility comments: patient received in recliner    Transfers Overall transfer level: Needs assistance Equipment used: Rolling walker (2 wheeled) Transfers: Sit to/from [facility] Sit to Stand: Min guard Stand pivot transfers: Min guard       General transfer comment: min guard for safety    Balance Overall balance assessment: Needs assistance Sitting-balance support: Feet  supported Sitting balance-Leahy Scale: Normal     Standing balance support: No upper extremity supported Standing balance-Leahy Scale: Fair Standing balance comment: able to perform grooming standing at sink                           ADL either performed or assessed with clinical judgement   ADL Overall ADL's : Needs assistance/impaired     Grooming: Wash/dry hands;Wash/dry face;Oral care;Standing;Supervision/safety Grooming Details (indicate cue type and reason): Patient performed standing at sink with cues for sequencing                             Functional mobility during ADLs: Rolling walker;Min guard General ADL Comments: required cues for breathing technique     Vision       Perception     Praxis      Cognition Arousal/Alertness: Awake/alert Behavior During Therapy: WFL for tasks assessed/performed Overall Cognitive Status: Impaired/Different from baseline Area of Impairment: Orientation;Memory;Safety/judgement;Awareness                 Orientation Level: Disoriented to;Time;Situation   Memory: Decreased recall of precautions;Decreased short-term memory   Safety/Judgement: Decreased awareness of safety;Decreased awareness of deficits Awareness: Emergent   General Comments: required frequent cues to stay on tasks and for safety        Exercises     Shoulder Instructions       General Comments patient had frequent nose bleeds during session.  Nursing informed and addressed    Pertinent Vitals/ Pain       Pain Assessment: No/denies pain  Home Living                                          Prior Functioning/Environment              Frequency  Min 2X/week        Progress Toward Goals  OT Goals(current goals can now be found in the care plan section)  Progress towards OT goals: Progressing toward goals  Acute Rehab OT Goals Patient Stated Goal: to go home OT Goal Formulation: With  patient Time For Goal Achievement: 12/24/20 Potential to Achieve Goals: Good ADL Goals Pt Will Perform Grooming: with modified independence;standing Pt Will Perform Lower Body Bathing: with modified independence;sit to/from stand Pt Will Perform Lower Body Dressing: with modified independence;sit to/from stand Pt Will Transfer to Toilet: with modified independence;ambulating;regular height toilet Pt/caregiver will Perform Home Exercise Program: Increased strength;Both right and left upper extremity;With theraband;With Supervision;With written HEP provided  Plan Discharge plan remains appropriate    Co-evaluation                 AM-PAC OT "6 Clicks" Daily Activity     Outcome Measure   Help from another person eating meals?: None Help from another person taking care of personal grooming?: None Help from another person toileting, which includes using toliet, bedpan, or urinal?: A Little Help from another person bathing (including washing, rinsing, drying)?: A Little Help from another person to put on and taking off regular upper body clothing?: None Help from another person to put on and taking off regular lower body clothing?: A Little 6 Click Score: 21    End of Session Equipment Utilized During Treatment: Gait belt;Rolling walker;Oxygen  OT Visit Diagnosis: Unsteadiness on feet (R26.81)   Activity Tolerance Patient tolerated treatment well   Patient Left in chair;with call bell/phone within reach;with chair alarm set   Nurse Communication Mobility status;Other (comment) (informed on nose bleeds)        Time: 1840-3754 OT Time Calculation (min): 21 min  Charges: OT General Charges $OT Visit: 1 Visit OT Treatments $Self Care/Home Management : 8-22 mins  Lodema Hong, [place]  Pager [phone] Office [place] 12/13/2020, 12:58 PM

## 2020-12-13 NOTE — TOC Progression Note (Addendum)
Transition of Care [facility]) - Progression Note    Patient Details  Name: Joshua [name] MRN: [id number] Date of Birth: 19-Jul-1945  Transition of [facility]) CM/SW Contact  Zenon Mayo, RN Phone Number: 12/13/2020, 4:27 PM  Clinical Narrative:    NCM spoke with daughter Johny Chess, she states she takes care of patient,  he was just here recently.  NCM informed her not to forget to bring his oxygen tank charger this time when she picks him up.  She states she will not forget.  She states patient is active with [facility] for HHRN , HHPT and they would like to continue with them.  NCM notified Ramond Marrow with [facility] that patient may be dc soon.  He has home oxygen , Carlina states she puts him on 3 liters at home, he has a walker also. He was on eliquis and tykison pta.   Expected Discharge Plan: [place] Barriers to Discharge: Continued Medical Work up  Expected Discharge Plan and Services Expected Discharge Plan: [place]   Discharge Planning Services: CM Consult Post Acute Care Choice: [place] arrangements for the past 2 months: Single Family Home                   DME Agency: NA       HH Arranged: RN, PT [place] Agency: [place] ([facility]) Date [place]: 12/13/20 Time [place]: 1627 Representative spoke with at [place]: [place] ([place]) Interventions    Readmission Risk Interventions No flowsheet data found.

## 2020-12-13 NOTE — Progress Notes (Addendum)
Progress Note  Patient Name: Joshua [name] Date of Encounter: 12/13/2020  [facility] Cardiologist: Shelva Majestic, MD   Subjective   Sitting up eating breakfast, no complaints.   Inpatient Medications    Scheduled Meds:  apixaban  5 mg Oral BID   dofetilide  250 mcg Oral BID   furosemide  80 mg Intravenous BID   pantoprazole  40 mg Oral Daily   rosuvastatin  10 mg Oral QHS   sodium chloride flush  3 mL Intravenous Q12H   Continuous Infusions:  sodium chloride     PRN Meds: sodium chloride, acetaminophen, ondansetron (ZOFRAN) IV, sodium chloride flush   Vital Signs    Vitals:   12/12/20 2128 12/12/20 2341 12/13/20 0343 12/13/20 0639  BP:  (!) 117/52 (!) 137/55   Pulse:  63 65   Resp: (!) 22 (!) 25 (!) 24   Temp:  97.8 F (36.6 C) (!) 97.5 F (36.4 C)   TempSrc:  Oral Oral   SpO2: 93% 100% 100%   Weight:    75.4 kg  Height:        Intake/Output Summary (Last 24 hours) at 12/13/2020 0701 Last data filed at 12/13/2020 0344 Gross per 24 hour  Intake 3 ml  Output 2175 ml  Net -2172 ml   Last 3 Weights 12/13/2020 12/12/2020 12/11/2020  Weight (lbs) 166 lb 3.6 oz 169 lb 5 oz 171 lb 11.8 oz  Weight (kg) 75.4 kg 76.8 kg 77.9 kg      Telemetry    Afib rates 40-60s mostly - Personally Reviewed  ECG    No new tracing  Physical Exam   GEN: No acute distress.   Neck: No JVD Cardiac: Irreg Irreg, 3/6 systolic murmur, no rubs, or gallops.  Respiratory: Clear to auscultation bilaterally. GI: Soft, nontender, non-distended  MS: 1+ bilateral LE edema; No deformity. Neuro:  Nonfocal  Psych: Normal affect   Labs    High Sensitivity Troponin:  No results for input(s): TROPONINIHS in the last 720 hours.   Chemistry Recent Labs  Lab 12/09/20 1418 12/10/20 0121 12/11/20 0116 12/12/20 0042 12/13/20 0126  NA 142   < > 141 140 141  K 4.3   < > 3.9 3.8 3.2*  CL 94*   < > 90* 88* 84*  CO2 41*   < > 45* >45* >45*  GLUCOSE 100*   < > 108* 120* 96  BUN  39*   < > 33* 27* 22  CREATININE 1.36*   < > 1.44* 1.41* 1.16  CALCIUM 8.9   < > 8.3* 8.1* 7.8*  MG 2.5*  --  2.3  --   --   PROT 6.1*  --   --   --   --   ALBUMIN 2.8*  --   --   --   --   AST 23  --   --   --   --   ALT 16  --   --   --   --   ALKPHOS 49  --   --   --   --   BILITOT 1.0  --   --   --   --   GFRNONAA 54*   < > 50* 52* >60  ANIONGAP 7   < > 6 NOT CALCULATED NOT CALCULATED   < > = values in this interval not displayed.    Lipids No results for input(s): CHOL, TRIG, HDL, LABVLDL, LDLCALC, CHOLHDL in the last 168  hours.  Hematology Recent Labs  Lab 12/11/20 0116 12/12/20 0042 12/13/20 0126  WBC 8.7 8.5 6.9  RBC 4.15* 4.11* 3.80*  HGB 13.0 13.0 11.9*  HCT 44.2 44.3 41.0  MCV 106.5* 107.8* 107.9*  MCH 31.3 31.6 31.3  MCHC 29.4* 29.3* 29.0*  RDW 14.6 14.5 14.2  PLT 125* 117* 98*   Thyroid  Recent Labs  Lab 12/09/20 1418  TSH 3.142    BNP Recent Labs  Lab 12/09/20 1418  BNP 1,067.4*    DDimer No results for input(s): DDIMER in the last 168 hours.   Radiology    No results found.  Cardiac Studies   N/a   Patient Profile     75 y.o. male with hx of HFpEF, CAD s/p prior PCI to LAD + RCA, HCM sigmoid morphology, moderate MR, moderate to severe MS, moderate to severe AI, moderate to severe TR, persistent atrial fibrillation/flutter, essential hypertension, COPD on home O2, who is admitted for the evaluation of acute on chronic heart failure with preserved ejection fraction.   Assessment & Plan    HFpEF with HCM: recent admission for same in the setting of severe valvular disease. CXR without significant edema, BNP 1067. Net - 3.4L, weight down 172>>166lbs. Cr actually improved.  -- will continue IV lasix 80mg  BID through today -- add spiro 12.5mg  daily, if renal function remains stable could add back ARB ( this was held DC last time with rising Cr)  Severe valvular heart disease: extensive work up during last admission with cMRI and TCTS evaluation.  Not a candidate for surgery or other intervention. Was seen by palliative care during last admission and made DNR, this was reversed on admission to full Code.   Persistent Afib: remains on Tikosyn 239mcg BID, along with Eliquis 5mg  BID  COPD: O2 dependent @3L . CXR on admission with stable chronic bibasilar opacities, no edema  Hypokalemia: K+ 3.2, supplement -- BMET in am  Dispo: per family he lives independently with help from them.There is concern over compliance with his home medications after he was discharged. PT/OT evaluation ordered. Will ask for CM evaluation regarding dispo plans with family's concern for compliance and HH needs at discharge.   For questions or updates, please contact [place] Please consult [email or web address] for contact info under        Signed, Reino Bellis, NP  12/13/2020, 7:02 AM    I have examined the patient and reviewed assessment and plan and discussed with patient.  Agree with above as stated.  Still with pitting pedal edema.  I do not think this will fully resolve given his pulmonary hypertension.  Continue with attempts at diuresis.  Blood pressure currently stable.  He does have some dizziness when he stands.  I also lowered his oxygen back to his baseline of 3 L/min nasal cannula.  Trying to get home health issues resolved so that he is less likely to come back to the hospital.  Larae Grooms

## 2020-12-13 NOTE — Progress Notes (Addendum)
Physical Therapy Treatment Patient Details Name: Joshua [name] MRN: [id number] DOB: 29-Jul-1945 Today's Date: 12/13/2020   History of Present Illness Patient is a 75 y/o male with acute on chronic diastolic heart failure, recently hospitalized for similar, with recent weight gain, hypoxia and significant volume overload.  Admitted for diuresis.  PMH includes PAF, LBBB, CHF, COPD, CAD, HTN.    PT Comments    Pt with limited gait today due to low SpO2. Per MD note family concerned about medication compliance at home. Today noted pt with poor short term memory and safety awareness. If this memory loss is baseline it may be the reason for non compliance. Pt may need some incr assist in the home for cognitive reasons and family may want to consider ALF type setting at some point.     Recommendations for follow up therapy are one component of a multi-disciplinary discharge planning process, led by the attending physician.  Recommendations may be updated based on patient status, additional functional criteria and insurance authorization.  Follow Up Recommendations  Home health PT;Supervision - Intermittent     Equipment Recommendations  None recommended by PT    Recommendations for Other Services       Precautions / Restrictions Precautions Precautions: Fall Precaution Comments: Watch O2 Restrictions Weight Bearing Restrictions: No     Mobility  Bed Mobility Overal bed mobility: Needs Assistance Bed Mobility: Supine to Sit   Sidelying to sit: HOB elevated;Min assist       General bed mobility comments: Assist to elevate trunk into sitting    Transfers Overall transfer level: Needs assistance Equipment used: Rolling walker (2 wheeled) Transfers: Sit to/from Stand Sit to Stand: Min assist Stand pivot transfers: Min guard       General transfer comment: Assist to bring hips up. Verbal cues for hand placement  Ambulation/Gait Ambulation/Gait assistance: Min guard Gait  Distance (Feet): 15 Feet (x 2) Assistive device: Rolling walker (2 wheeled) Gait Pattern/deviations: Step-through pattern;Decreased stride length Gait velocity: decr Gait velocity interpretation: <1.8 ft/sec, indicate of risk for recurrent falls General Gait Details: Assist for safety and lines. Only amb to bathroom and back to recliner due to SpO2 77-72% on 4L then bumped to 6L with little change.   Stairs             Wheelchair Mobility    Modified Rankin (Stroke Patients Only)       Balance Overall balance assessment: Needs assistance Sitting-balance support: Feet supported;No upper extremity supported Sitting balance-Leahy Scale: Good     Standing balance support: Bilateral upper extremity supported Standing balance-Leahy Scale: Poor Standing balance comment: walker and min guard for static standing                            Cognition Arousal/Alertness: Awake/alert Behavior During Therapy: WFL for tasks assessed/performed Overall Cognitive Status: No family/caregiver present to determine baseline cognitive functioning Area of Impairment: Memory;Safety/judgement;Awareness;Orientation                 Orientation Level: Disoriented to;Time;Situation   Memory: Decreased recall of precautions;Decreased short-term memory   Safety/Judgement: Decreased awareness of safety;Decreased awareness of deficits Awareness: Emergent   General Comments: Pt didn't appear to remember ambulating in hallway yesterday. Pt also seemed to forget why he was sitting on toilet.      Exercises      General Comments General comments (skin integrity, edema, etc.): Pt on 4L O2 at rest in bed  with SpO2 80-84%. Assisted pt up to EOB with SpO2 77%. Incr O2 to 6L. Up to bathroom with SpO2 initially 76-84% and then eventual return to 90% with cues for pursed lip breathing. Deferred amb in hallway due to low SpO2. 10 minutes after treatment with pt sitting up in chair with 6L O2  SpO2 96%.      Pertinent Vitals/Pain Pain Assessment: No/denies pain    Home Living                      Prior Function            PT Goals (current goals can now be found in the care plan section) Acute Rehab PT Goals Patient Stated Goal: to go home Progress towards PT goals: Not progressing toward goals - comment (due to low SpO2)    Frequency    Min 3X/week      PT Plan Current plan remains appropriate    Co-evaluation              AM-PAC PT "6 Clicks" Mobility   Outcome Measure  Help needed turning from your back to your side while in a flat bed without using bedrails?: None Help needed moving from lying on your back to sitting on the side of a flat bed without using bedrails?: A Little Help needed moving to and from a bed to a chair (including a wheelchair)?: A Little Help needed standing up from a chair using your arms (e.g., wheelchair or bedside chair)?: A Little Help needed to walk in hospital room?: A Little Help needed climbing 3-5 steps with a railing? : A Little 6 Click Score: 19    End of Session Equipment Utilized During Treatment: Gait belt;Oxygen Activity Tolerance: Treatment limited secondary to medical complications (Comment) (decr SpO2) Patient left: in chair;with chair alarm set Nurse Communication: Mobility status PT Visit Diagnosis: Other abnormalities of gait and mobility (R26.89);Muscle weakness (generalized) (M62.81)     Time: 9150-5697 PT Time Calculation (min) (ACUTE ONLY): 22 min  Charges:  $Gait Training: 8-22 mins                     [place] Pager [phone] Office [place] 12/13/2020, 2:48 PM

## 2020-12-13 NOTE — Progress Notes (Signed)
Mobility Specialist Progress Note:   12/13/20 1355  Mobility  Activity Ambulated to bathroom  Level of Assistance Standby assist, set-up cues, supervision of patient - no hands on  Assistive Device Front wheel walker  Distance Ambulated (ft) 30 ft  Mobility Ambulated with assistance in room  Mobility Response Tolerated fair  Mobility performed by Mobility specialist  Bed Position Chair  $Mobility charge 1 Mobility   Pre- Mobility: 69 HR;  116/46 BP;  96% SpO2 Post Mobility:   73 HR;  120/50 BP;  90% SpO2  Pt received in chair willing to participate in mobility. Pt requested to go to the restroom. Upon standing SpO2 dropped to 78% (unreliable pleth), after standing for a minute SpO2 went up to 90%. Ambulated to bathroom and pt tried to have BM. After trying pt requested to go back to chair. Left in chair with call bell in reach and all needs met.   [facility] Health and safety inspector Phone [phone]

## 2020-12-14 DIAGNOSIS — I071 Rheumatic tricuspid insufficiency: Secondary | ICD-10-CM

## 2020-12-14 DIAGNOSIS — J449 Chronic obstructive pulmonary disease, unspecified: Secondary | ICD-10-CM

## 2020-12-14 DIAGNOSIS — I480 Paroxysmal atrial fibrillation: Secondary | ICD-10-CM

## 2020-12-14 LAB — BASIC METABOLIC PANEL
BUN: 20 mg/dL (ref 8–23)
CO2: >45 mmol/L — ABNORMAL HIGH (ref 22–32)
Calcium: 7.8 mg/dL — ABNORMAL LOW (ref 8.9–10.3)
Chloride: 76 mmol/L — ABNORMAL LOW (ref 98–111)
Creatinine, Ser: 1.23 mg/dL (ref 0.61–1.24)
GFR, Estimated: >60 mL/min (ref >60)
Glucose, Bld: 153 mg/dL — ABNORMAL HIGH (ref 70–99)
Potassium: 3.6 mmol/L (ref 3.5–5.1)
Sodium: 135 mmol/L (ref 135–145)

## 2020-12-14 LAB — CBC
HCT: 40.2 % (ref 39.0–52.0)
Hemoglobin: 11.4 g/dL — ABNORMAL LOW (ref 13.0–17.0)
MCH: 30.6 pg (ref 26.0–34.0)
MCHC: 28.4 g/dL — ABNORMAL LOW (ref 30.0–36.0)
MCV: 108.1 fL — ABNORMAL HIGH (ref 80.0–100.0)
Platelets: 97 10*3/uL — ABNORMAL LOW (ref 150–400)
RBC: 3.72 MIL/uL — ABNORMAL LOW (ref 4.22–5.81)
RDW: 14.3 % (ref 11.5–15.5)
WBC: 6.7 10*3/uL (ref 4.0–10.5)
nRBC: 0 % (ref 0.0–0.2)

## 2020-12-14 MED ORDER — SPIRONOLACTONE 25 MG PO TABS: 12.5000 mg | ORAL_TABLET | Freq: Every day | ORAL | 0 refills | Status: DC

## 2020-12-14 MED ORDER — MAGNESIUM HYDROXIDE 400 MG/5ML PO SUSP
30.0000 mL | Freq: Every day | ORAL | Status: DC | PRN
  Administered 2020-12-14: 30 mL via ORAL
  Filled 2020-12-14: qty 30

## 2020-12-14 MED ORDER — BISACODYL 5 MG PO TBEC
5.0000 mg | DELAYED_RELEASE_TABLET | Freq: Every day | ORAL | Status: DC | PRN
  Administered 2020-12-14: 5 mg via ORAL
  Filled 2020-12-14: qty 1

## 2020-12-14 MED ORDER — POTASSIUM CHLORIDE CRYS ER 20 MEQ PO TBCR
60.0000 meq | EXTENDED_RELEASE_TABLET | Freq: Once | ORAL | Status: AC
  Administered 2020-12-14: 60 meq via ORAL
  Filled 2020-12-14: qty 3

## 2020-12-14 NOTE — Progress Notes (Signed)
Small blister to left anterior leg  draining to serosanguinous  output  in moderate amount. Cleansed and dressing applied by RN dr. Irish Lack made aware. Ok to d/c home.

## 2020-12-14 NOTE — Progress Notes (Signed)
Mobility Specialist Progress Note:   12/14/20 1500  Mobility  Activity Ambulated in hall  Level of Assistance Contact guard assist, steadying assist  Assistive Device Front wheel walker  Distance Ambulated (ft) 45 ft  Mobility Ambulated with assistance in hallway  Mobility Response Tolerated fair  Mobility performed by Mobility specialist  Bed Position Chair  $Mobility charge 1 Mobility   Pre- Mobility: 90% SpO2 During Mobility: 87%-93% SpO2 Post Mobility:   92% SpO2  Pt received in chair willing to participate in mobility. Complaints of 8/10 abdomen pain but otherwise asymptomatic. Required 1 short standing break as patient said " he felt dizzy". Returned to chair with call bell in reach and all needs met.   [facility] Health and safety inspector Phone [phone]

## 2020-12-14 NOTE — Progress Notes (Signed)
Complained of constipation. Prune juice and  mom given with no result. Pt having a frequent trip to the bathroom. NP made aware. Dulcolax tab given. Moved bowel after few min. Large amount brown in color.

## 2020-12-14 NOTE — Discharge Summary (Addendum)
Discharge Summary    Patient ID: Joshua [name] MRN: [id number]; DOB: Jul 06, 1945  Admit date: 12/09/2020 Discharge date: 12/14/2020  PCP:  [name], [name], MD   [facility] Providers Cardiologist:  Shelva Majestic, MD  Electrophysiologist:  Thompson Grayer, MD  {  Discharge Diagnoses    Principal Problem:   Acute on chronic diastolic CHF (congestive heart failure) ([place]) Active Problems:   Hypertrophic obstructive cardiomyopathy (HCC)   COPD (chronic obstructive pulmonary disease) ([place])   PAF (paroxysmal atrial fibrillation) ([place])   Mitral regurgitation   Tricuspid regurgitation    Diagnostic Studies/Procedures    N/a _____________   History of Present Illness     Joshua [name] is a 75 y.o. male with a history of HFpEF, coronary artery disease status post prior PCI to the LAD and RCA, moderate MR MS, A AR, with persistent atrial fibrillation/flutter, hypertension, COPD on home O2.  He was also recently admitted for lower GI bleed in July 2022 which was secondary to internal hemorrhoids.  He was therefore was continued on Eliquis for CVA prevention.  Recent admission to the hospital 9/26 through 10/4 with complaints of "feeling blah", dry cough, leg edema.  He admitted to not being very active.  The chest x-ray revealed small bilateral effusions, with a 4.8 x 3.2 cm opacity over the left heart with recommendations for follow-up chest x-ray.  He was given Diamox 250 mg x 2.  He was started on Jardiance however his creatinine increased and therefore this was discontinued. Losartan and spironolactone were discontinued due to hypotension.  He was seen in the office on 10/14 with continued complaints of lower extremity edema.  He was also noted to be hypoxic with sats in the 80s.  There was concern about noncompliance with his home medications and he was clearly volume overloaded.  He was direct admitted to [facility] for further management.    Hospital Course     HFpEF with HCM: recent  admission for same in the setting of severe valvular disease. CXR without significant edema, BNP 1067. Net - 5.3L, weight down 172>>165lbs. Cr actually improved to 1.2.  -- Resumed on p.o. Lasix 80 mg daily at discharge.  Tolerated the addition of spironolactone 12.5 mg daily.  Consider resuming ARB as an outpatient if renal function is stable   Severe valvular heart disease: extensive work up during last admission with cMRI and TCTS evaluation. Not a candidate for surgery or other intervention. Was seen by palliative care during last admission and made DNR, this was reversed on admission to full Code.    Persistent Afib: Continue on Tikosyn 264mcg BID, along with Eliquis 5mg  BID   COPD: O2 dependent @3L . CXR on admission with stable chronic bibasilar opacities, no edema -- he was weaned back to baseline   Hypokalemia: Resolved, potassium 3.6 on the day of discharge. -- BMET at follow up  Patient was seen by Dr. Irish Lack and deemed stable for discharge home.  Follow-up in the office has been arranged.  Medication adjustments sent to patient's pharmacy of choice.  Orders placed for continued home health PT/RN at discharge.  Did the patient have an acute coronary syndrome (MI, NSTEMI, STEMI, etc) this admission?:  No                               Did the patient have a percutaneous coronary intervention (stent / angioplasty)?:  No.   _____________  Discharge Vitals  Blood pressure (!) 138/54, pulse 60, temperature 98.7 F (37.1 C), temperature source Oral, resp. rate 18, height 5\' 8"  (1.727 m), weight 75.2 kg, SpO2 96 %.  Filed Weights   12/12/20 0450 12/13/20 0639 12/14/20 0548  Weight: 76.8 kg 75.4 kg 75.2 kg    Labs & Radiologic Studies    CBC Recent Labs    12/13/20 0126 12/14/20 0227  WBC 6.9 6.7  HGB 11.9* 11.4*  HCT 41.0 40.2  MCV 107.9* 108.1*  PLT 98* 97*   Basic Metabolic Panel Recent Labs    12/13/20 0126 12/14/20 0227  NA 141 135  K 3.2* 3.6  CL 84* 76*  CO2  >45* >45*  GLUCOSE 96 153*  BUN 22 20  CREATININE 1.16 1.23  CALCIUM 7.8* 7.8*   Liver Function Tests No results for input(s): AST, ALT, ALKPHOS, BILITOT, PROT, ALBUMIN in the last 72 hours. No results for input(s): LIPASE, AMYLASE in the last 72 hours. High Sensitivity Troponin:   No results for input(s): TROPONINIHS in the last 720 hours.  BNP Invalid input(s): POCBNP D-Dimer No results for input(s): DDIMER in the last 72 hours. Hemoglobin A1C No results for input(s): HGBA1C in the last 72 hours. Fasting Lipid Panel No results for input(s): CHOL, HDL, LDLCALC, TRIG, CHOLHDL, LDLDIRECT in the last 72 hours. Thyroid Function Tests No results for input(s): TSH, T4TOTAL, T3FREE, THYROIDAB in the last 72 hours.  Invalid input(s): FREET3 _____________  DG Chest 2 View  Result Date: 11/22/2020 CLINICAL DATA:  Shortness of breath, peripheral edema, history of CHF, COPD, cirrhosis, coronary artery disease, hypertension EXAM: CHEST - 2 VIEW COMPARISON:  11/07/2020 FINDINGS: Enlargement of cardiac silhouette with pulmonary vascular congestion. Atherosclerotic calcification aorta. Chronic bibasilar pleural thickening, RIGHT pleural calcification, blunting of the costophrenic angles, and bibasilar atelectasis. Slight chronic accentuation of interstitial markings stable. No pneumothorax. Mitral annular calcification and coronary arterial stent noted. 4.8 x 3.2 cm diameter opacity projects over LEFT heart on PA view, not localized on lateral view, may represent loculated fluid or consolidation, mass unlikely but not excluded. Bones unremarkable. IMPRESSION: Enlargement of cardiac silhouette. Chronic bibasilar scarring and atelectasis as well as chronic interstitial disease. Questionable 4.8 x 3.2 cm opacity projecting over the LEFT heart, potentially loculated fluid or consolidation, mass not excluded; either CT assessment or radiographic follow-up until resolution recommended to exclude tumor. Aortic  Atherosclerosis (ICD10-I70.0). Electronically Signed   By: Lavonia Dana M.D.   On: 11/22/2020 08:23   US Venous Img Lower Bilateral  Result Date: 11/21/2020 CLINICAL DATA:  Bilateral lower extremity edema EXAM: BILATERAL LOWER EXTREMITY VENOUS DOPPLER ULTRASOUND TECHNIQUE: Gray-scale sonography with compression, as well as color and duplex ultrasound, were performed to evaluate the deep venous system(s) from the level of the common femoral vein through the popliteal and proximal calf veins. COMPARISON:  None. FINDINGS: VENOUS Normal compressibility of the common femoral, superficial femoral, and popliteal veins, as well as the visualized calf veins. Visualized portions of profunda femoral vein and great saphenous vein unremarkable. No filling defects to suggest DVT on grayscale or color Doppler imaging. Doppler waveforms show normal direction of venous flow, normal respiratory plasticity and response to augmentation. OTHER None. Limitations: none IMPRESSION: No lower extremity DVT. Electronically Signed   By: Miachel Roux M.D.   On: 11/21/2020 16:52   DG Chest Port 1 View  Result Date: 12/10/2020 CLINICAL DATA:  Shortness of breath EXAM: PORTABLE CHEST 1 VIEW COMPARISON:  Chest x-rays dated 11/21/2020 and 05/11/2019. FINDINGS: Chronic bibasilar opacities without  significant change. No new lung findings. No evidence of pleural effusion or pneumothorax is seen. Heart size and mediastinal contours are stable. Osseous structures about the chest are unremarkable. IMPRESSION: 1. No active disease. No evidence of pneumonia or pulmonary edema. 2. Stable chronic bibasilar opacities, likely chronic atelectasis and/or scarring. Electronically Signed   By: Franki Cabot M.D.   On: 12/10/2020 12:10   DG Chest Port 1 View  Result Date: 11/21/2020 CLINICAL DATA:  Edema, bilateral leg swelling EXAM: PORTABLE CHEST 1 VIEW COMPARISON:  11/21/2020, 12:14 p.m. FINDINGS: No significant change in chest radiographs.  Cardiomegaly with diffuse bilateral interstitial pulmonary opacity and small bilateral pleural effusions. Pleural thickening and calcification about the lung bases. Osseous structures are unremarkable. IMPRESSION: No significant change in chest radiographs. Cardiomegaly with diffuse bilateral interstitial pulmonary opacity and small bilateral pleural effusions. Findings are most consistent with edema. Electronically Signed   By: Eddie Candle M.D.   On: 11/21/2020 16:59   MR CARDIAC MORPHOLOGY W WO CONTRAST  Result Date: 11/25/2020 CLINICAL DATA:  Evaluate HCM, valvular heart disease EXAM: CARDIAC MRI TECHNIQUE: The patient was scanned on a 1.5 Tesla Siemens magnet. A dedicated cardiac coil was used. Functional imaging was done using Fiesta sequences. 2,3, and 4 chamber views were done to assess for RWMA's. Modified Simpson's rule using a short axis stack was used to calculate an ejection fraction on a dedicated work Conservation officer, nature. The patient received 8 cc of Gadavist. After 10 minutes inversion recovery sequences were used to assess for infiltration and scar tissue. CONTRAST:  8 cc  of Gadavist FINDINGS: Left ventricle: -Asymmetric hypertrophy measuring 40mm in basal septum (55mm in posterior wall) -Mild dilatation -Normal systolic function -Nonspecific ECV elevation (36%) -Patchy LGE at RV insertion sites and basal septum. LGE accounts for 12% of total myocardial mass LV EF: 61% (Normal 56-78%) Absolute volumes: LV EDV: 132mL (Normal 77-195 mL) LV ESV: 34mL (Normal 19-72 mL) LV SV: 167mL (Normal 51-133 mL) CO: 6.3L/min (Normal 2.8-8.8 L/min) Indexed volumes: LV EDV: 77mL/sq-m (Normal 47-92 mL/sq-m) LV ESV: 40mL/sq-m (Normal 13-30 mL/sq-m) LV SV: 49mL/sq-m (Normal 32-62 mL/sq-m) CI: 3.3L/min/sq-m (Normal 1.7-4.2 L/min/sq-m) Right ventricle: Mild dilatation with normal systolic function RV EF:  50% (Normal 47-74%) Absolute volumes: RV EDV: 266mL (Normal 88-227 mL) RV ESV: 136mL (Normal 23-103 mL)  RV SV: 136mL (Normal 52-138 mL) CO: 5.8L/min (Normal 2.8-8.8 L/min) Indexed volumes: RV EDV: 140mL/sq-m (Normal 55-105 mL/sq-m) RV ESV: 82mL/sq-m (Normal 15-43 mL/sq-m) RV SV: 87mL/sq-m (Normal 32-64 mL/sq-m) CI: 3.0L/min/sq-m (Normal 1.7-4.2 L/min/sq-m) Left atrium: Moderate enlargement Right atrium: Mild enlargement Mitral valve: Moderate regurgitation with regurgitant fraction 35% (40% cutoff for severe in cardiac MRI) Aortic valve: Moderate to severe regurgitation with regurgitant fraction 32% (35% cutoff for severe in cardiac MRI) Tricuspid valve: Moderate to severe regurgitation with regurgitant fraction 38% (40% cutoff for severe in cardiac MRI) Pulmonic valve: Mild regurgitation with regurgitant fraction 12% Aorta: Normal proximal ascending aorta Pulmonary artery: Dilated main PA measuring 54mm Pericardium: Normal Extracardiac structures: Bilateral lower lobe airspace disease, consider CT chest IMPRESSION: 1. Asymmetric LV hypertrophy measuring 30mm in basal septum (63mm in posterior wall), consistent with hypertrophic cardiomyopathy 2. Patchy late gadolinium enhancement at RV insertion sites and basal septum, consistent with HCM. LGE accounts for 12% of total myocardial mass 3.  Mild LV dilatation with normal systolic function (EF 44%) 4.  Mild RV dilatation with normal systolic function (EF 96%) 5. Moderate to severe aortic valve regurgitation with regurgitant fraction 32% (35% cutoff for  severe AI in cardiac MRI) 6. Moderate to severe tricuspid valve regurgitation with regurgitant fraction 38% (40% cutoff for severe TR) 7. Moderate mitral valve regurgitation with regurgitant fraction 35% (40% cutoff for severe MR) 8.  Mild pulmonic valve regurgitation with regurgitant fraction 12% 9. Bilateral lower lobe airspace disease, consider CT chest for further evaluation Electronically Signed   By: Oswaldo Milian M.D.   On: 11/25/2020 18:10   Disposition   Pt is being discharged home today in good  condition.  Follow-up Plans & Appointments     Follow-up Information     [facility] Follow up.   WhyMarland Kitchen HHRN,HHPT,   [place], NP Follow up on 12/29/2020.   Specialty: Cardiology Why: at 8:50am for your follow up appt with Dr. Evette Georges NP Contact information: [address] [place] [address] [phone]                Discharge Instructions     Call MD for:  difficulty breathing, headache or visual disturbances   Complete by: As directed    Call MD for:  persistant dizziness or light-headedness   Complete by: As directed    Call MD for:  redness, tenderness, or signs of infection (pain, swelling, redness, odor or green/yellow discharge around incision site)   Complete by: As directed    Diet - low sodium heart healthy   Complete by: As directed    Face-to-face encounter (required for Medicare/Medicaid patients)   Complete by: As directed    I Joshua [name] certify that this patient is under my care and that I, or a nurse practitioner or physician's assistant working with me, had a face-to-face encounter that meets the physician face-to-face encounter requirements with this patient on 12/14/2020. The encounter with the patient was in whole, or in part for the following medical condition(s) which is the primary reason for home health care (List medical condition): Deconditioned, CAD, valvular disease   The encounter with the patient was in whole, or in part, for the following medical condition, which is the primary reason for home health care: Deconditioned, CAD, valvular disease   I certify that, based on my findings, the following services are medically necessary home health services:  Nursing Physical therapy     Reason for Medically Necessary Home Health Services: Other See Comments   My clinical findings support the need for the above services: OTHER SEE COMMENTS   Further, I certify that my clinical findings  support that this patient is homebound due to: Ambulates short distances less than 300 feet   Home Health   Complete by: As directed    To provide the following care/treatments:  PT RN     Increase activity slowly   Complete by: As directed        Discharge Medications   Allergies as of 12/14/2020   No Known Allergies      Medication List     TAKE these medications    apixaban 5 MG Tabs tablet Commonly known as: ELIQUIS Take 1 tablet (5 mg total) by mouth 2 (two) times daily.   cholecalciferol 1000 units tablet Commonly known as: VITAMIN D Take 1,000 Units by mouth daily.   cyanocobalamin 1000 MCG tablet Commonly known as: CVS VITAMIN B12 Take 1 tablet (1,000 mcg total) by mouth daily.   dofetilide 250 MCG capsule Commonly known as: TIKOSYN Take 250 mcg by mouth 2 (two) times  daily.   furosemide 80 MG tablet Commonly known as: LASIX Take 80 mg by mouth daily as needed for fluid or edema.   omeprazole 10 MG capsule Commonly known as: PRILOSEC TAKE ONE CAPSULE BY MOUTH ONE TIME DAILY   polyethylene glycol 17 g packet Commonly known as: MIRALAX / GLYCOLAX Take 17 g by mouth daily as needed for mild constipation.   potassium chloride SA 20 MEQ tablet Commonly known as: KLOR-CON Take 20 mEq by mouth daily as needed (when taking Furosemide).   rosuvastatin 10 MG tablet Commonly known as: CRESTOR Take 1 tablet (10 mg total) by mouth daily. What changed: when to take this   spironolactone 25 MG tablet Commonly known as: ALDACTONE Take 0.5 tablets (12.5 mg total) by mouth daily. Start taking on: December 15, 2020   tamsulosin 0.4 MG Caps capsule Commonly known as: FLOMAX Take 1 capsule (0.4 mg total) by mouth daily.         Outstanding Labs/Studies   BMET at follow up  Duration of Discharge Encounter   Greater than 30 minutes including physician time.  Signed, Joshua Bellis, NP 12/14/2020, 5:06 PM  I have examined the patient and reviewed  assessment and plan and discussed with patient.  Agree with above as stated.  Patient with persistent LE edema, likely related to pulm HTN.   Chronic diastolic heart failure: Diuresing well.  Cr stable.Wants to go home. Tolerating spironolactone.    Severe valvular disease: No plans for surgery.   AFib: rate controlled.    COPD: O2 dependent 3L/min.  Feels that he is at his baseline.   Home health support arranged.  Daughter should be primary contact.   [place] for discharge later today.  Joshua [name]

## 2020-12-14 NOTE — Progress Notes (Signed)
Kept pulling telemetry and pulse ox.despite instructions given to keep it for monitoring purpose.  Always get out of bed without calling for help,  chair and bed alarm on. Continue to monitor.

## 2020-12-14 NOTE — Progress Notes (Signed)
No sustained sleep or rest overnight. Patient confused most of night with some periods of orientation. Patient was oriented to self but thinks he is in a warehouse and wants to go home. Patient paranoid about staff and medication. Patient refused to take medication at 20:30 stating it was a waste to give him medication but after education and prompting agreed to take it, then an hour later he accused me of withholding his medication. He thinks staff is lying to him about who we are and the time of day as well. Patient is easily redirected and reoriented.  Will continue to monitor and redirect behavior and reorient him to surroundings.

## 2020-12-14 NOTE — Progress Notes (Signed)
Heart rate monitor reading and giving heart rate of 200 counting the flutter waves.pt is stable. CMT aware. Biomed  consulted  by secretary to check on the cardiac monitor. Awaiting for them to come.Marland Kitchen

## 2020-12-14 NOTE — TOC Transition Note (Signed)
Transition of Care [facility]) - CM/SW Discharge Note   Patient Details  Name: MILIND RAETHER MRN: [id number] Date of Birth: 28-Apr-1945  Transition of Care [facility]) CM/SW Contact:  Zenon Mayo, RN Phone Number: 12/14/2020, 4:45 PM   Clinical Narrative:    NCM spoke with daughter Johny Chess, she states she takes care of patient,  he was just here recently.  NCM informed her not to forget to bring his oxygen tank charger this time when she picks him up.  She states she will not forget.  She states patient is active with [facility] for HHRN , HHPT and they would like to continue with them.  NCM notified Ramond Marrow with [facility] that patient may be dc soon.  He has home oxygen , Carlina states she puts him on 3 liters at home, he has a walker also.  NCM asked MD to send meds to [facility] pharmacy to have filled if he has any new medications.  He was on eliquis and tykison pta.     Final next level of care: [place] Barriers to Discharge: No Barriers Identified   Patient Goals and CMS Choice Patient states their goals for this hospitalization and ongoing recovery are:: return home with daughter CMS Medicare.gov Compare Post Acute Care list provided to:: Patient Represenative (must comment) Choice offered to / list presented to : Adult Children  Discharge Placement                       Discharge Plan and Services   Discharge Planning Services: CM Consult Post Acute Care Choice: Home Health            DME Agency: NA       HH Arranged: RN, PT [place] Agency: [place] ([facility]) Date [place]: 12/13/20 Time [place]: 1627 Representative spoke with at [place]: [place] Determinants of Health ([place]) Interventions     Readmission Risk Interventions No flowsheet data found.

## 2020-12-14 NOTE — Progress Notes (Signed)
Complaining of constipation. Warm prune juice and MOM 30 ml given at 1030. Keep going to the bath room to move bowel but unable to do it. Instructed to wait till med in effect. Continue to monitor.

## 2020-12-14 NOTE — Progress Notes (Signed)
Discharged home accompanied by daughter,discharge instructions given to daughter CArina. Belongings taken home.

## 2020-12-14 NOTE — Progress Notes (Signed)
Progress Note  Patient Name: Joshua [name] Date of Encounter: 12/14/2020  [facility] Cardiologist: Shelva Majestic, MD   Subjective   Feels well.  Wants to go home.  Has had some constipation.   Inpatient Medications    Scheduled Meds:  apixaban  5 mg Oral BID   dofetilide  250 mcg Oral BID   furosemide  80 mg Intravenous BID   pantoprazole  40 mg Oral Daily   potassium chloride  60 mEq Oral Once   rosuvastatin  10 mg Oral QHS   sodium chloride flush  3 mL Intravenous Q12H   spironolactone  12.5 mg Oral Daily   Continuous Infusions:  sodium chloride     PRN Meds: sodium chloride, acetaminophen, albuterol, bisacodyl, magnesium hydroxide, ondansetron (ZOFRAN) IV, sodium chloride flush   Vital Signs    Vitals:   12/13/20 2317 12/14/20 0548 12/14/20 0725 12/14/20 1204  BP: (!) 120/49  (!) 110/54 (!) 138/54  Pulse: 64  65 (!) 110  Resp: 20  18   Temp: 98.7 F (37.1 C)     TempSrc: Oral     SpO2: 96%  95% 96%  Weight:  75.2 kg    Height:        Intake/Output Summary (Last 24 hours) at 12/14/2020 1629 Last data filed at 12/14/2020 1150 Gross per 24 hour  Intake 1042 ml  Output 1875 ml  Net -833 ml   Last 3 Weights 12/14/2020 12/13/2020 12/12/2020  Weight (lbs) 165 lb 12.6 oz 166 lb 3.6 oz 169 lb 5 oz  Weight (kg) 75.2 kg 75.4 kg 76.8 kg      Telemetry    AFib - Personally Reviewed  ECG      Physical Exam   GEN: No acute distress.   Neck: No JVD Cardiac: RRR, no murmurs, rubs, or gallops.  Respiratory: Clear to auscultation bilaterally. GI: Soft, nontender, non-distended  MS: 1+ LE edema bilaterally; No deformity. Neuro:  Nonfocal  Psych: Normal affect   Labs    High Sensitivity Troponin:  No results for input(s): TROPONINIHS in the last 720 hours.   Chemistry Recent Labs  Lab 12/09/20 1418 12/10/20 0121 12/11/20 0116 12/12/20 0042 12/13/20 0126 12/14/20 0227  NA 142   < > 141 140 141 135  K 4.3   < > 3.9 3.8 3.2* 3.6  CL 94*   < >  90* 88* 84* 76*  CO2 41*   < > 45* >45* >45* >45*  GLUCOSE 100*   < > 108* 120* 96 153*  BUN 39*   < > 33* 27* 22 20  CREATININE 1.36*   < > 1.44* 1.41* 1.16 1.23  CALCIUM 8.9   < > 8.3* 8.1* 7.8* 7.8*  MG 2.5*  --  2.3  --   --   --   PROT 6.1*  --   --   --   --   --   ALBUMIN 2.8*  --   --   --   --   --   AST 23  --   --   --   --   --   ALT 16  --   --   --   --   --   ALKPHOS 49  --   --   --   --   --   BILITOT 1.0  --   --   --   --   --   GFRNONAA 54*   < >  50* 52* >60 >60  ANIONGAP 7   < > 6 NOT CALCULATED NOT CALCULATED NOT CALCULATED   < > = values in this interval not displayed.    Lipids No results for input(s): CHOL, TRIG, HDL, LABVLDL, LDLCALC, CHOLHDL in the last 168 hours.  Hematology Recent Labs  Lab 12/12/20 0042 12/13/20 0126 12/14/20 0227  WBC 8.5 6.9 6.7  RBC 4.11* 3.80* 3.72*  HGB 13.0 11.9* 11.4*  HCT 44.3 41.0 40.2  MCV 107.8* 107.9* 108.1*  MCH 31.6 31.3 30.6  MCHC 29.3* 29.0* 28.4*  RDW 14.5 14.2 14.3  PLT 117* 98* 97*   Thyroid  Recent Labs  Lab 12/09/20 1418  TSH 3.142    BNP Recent Labs  Lab 12/09/20 1418  BNP 1,067.4*    DDimer No results for input(s): DDIMER in the last 168 hours.   Radiology    No results found.  Cardiac Studies   Normal LVEF, valvular heart disease on echo  Patient Profile     75 y.o. male with severe valvular heart disease  Assessment & Plan    Chronic diastolic heart failure: Diuresing well.  Cr stable.Wants to go home. Tolerating spironolactone.   Severe valvular disease: No plans for surgery.  AFib: rate controlled.   COPD: O2 dependent 3L/min.  Feels that he is at his baseline.   For questions or updates, please contact [place] Please consult [email or web address] for contact info under        Signed, Larae Grooms, MD  12/14/2020, 4:29 PM

## 2020-12-14 NOTE — Progress Notes (Signed)
[facility] came and checked on the monitor. Box changed and heart rate shows normal.

## 2020-12-14 NOTE — Progress Notes (Signed)
Noted with minimal nose bleeding, o2 with humidifier. Continue to monitor.

## 2020-12-15 ENCOUNTER — Other Ambulatory Visit: Payer: Self-pay

## 2020-12-15 ENCOUNTER — Emergency Department (HOSPITAL_COMMUNITY): Payer: No Typology Code available for payment source

## 2020-12-15 ENCOUNTER — Emergency Department (HOSPITAL_COMMUNITY)
Admission: EM | Admit: 2020-12-15 | Discharge: 2020-12-16 | Disposition: A | Discharge status: Home / Self Care | Payer: No Typology Code available for payment source | Attending: Emergency Medicine | Admitting: Emergency Medicine

## 2020-12-15 ENCOUNTER — Encounter (HOSPITAL_COMMUNITY): Payer: Self-pay | Admitting: Emergency Medicine

## 2020-12-15 ENCOUNTER — Ambulatory Visit: Payer: Medicare PPO | Admitting: Family Medicine

## 2020-12-15 DIAGNOSIS — Z87891 Personal history of nicotine dependence: Secondary | ICD-10-CM | POA: Diagnosis not present

## 2020-12-15 DIAGNOSIS — Y93E1 Activity, personal bathing and showering: Secondary | ICD-10-CM | POA: Insufficient documentation

## 2020-12-15 DIAGNOSIS — I110 Hypertensive heart disease with heart failure: Secondary | ICD-10-CM | POA: Insufficient documentation

## 2020-12-15 DIAGNOSIS — M542 Cervicalgia: Secondary | ICD-10-CM | POA: Diagnosis not present

## 2020-12-15 DIAGNOSIS — S51011A Laceration without foreign body of right elbow, initial encounter: Secondary | ICD-10-CM | POA: Diagnosis not present

## 2020-12-15 DIAGNOSIS — S21219A Laceration without foreign body of unspecified back wall of thorax without penetration into thoracic cavity, initial encounter: Secondary | ICD-10-CM | POA: Insufficient documentation

## 2020-12-15 DIAGNOSIS — S81811A Laceration without foreign body, right lower leg, initial encounter: Secondary | ICD-10-CM | POA: Diagnosis not present

## 2020-12-15 DIAGNOSIS — W182XXA Fall in (into) shower or empty bathtub, initial encounter: Secondary | ICD-10-CM | POA: Diagnosis not present

## 2020-12-15 DIAGNOSIS — R011 Cardiac murmur, unspecified: Secondary | ICD-10-CM | POA: Insufficient documentation

## 2020-12-15 DIAGNOSIS — S81812A Laceration without foreign body, left lower leg, initial encounter: Secondary | ICD-10-CM | POA: Diagnosis not present

## 2020-12-15 DIAGNOSIS — S59901A Unspecified injury of right elbow, initial encounter: Secondary | ICD-10-CM | POA: Diagnosis present

## 2020-12-15 DIAGNOSIS — R197 Diarrhea, unspecified: Secondary | ICD-10-CM | POA: Insufficient documentation

## 2020-12-15 DIAGNOSIS — I5043 Acute on chronic combined systolic (congestive) and diastolic (congestive) heart failure: Secondary | ICD-10-CM | POA: Diagnosis not present

## 2020-12-15 DIAGNOSIS — S199XXA Unspecified injury of neck, initial encounter: Secondary | ICD-10-CM | POA: Diagnosis not present

## 2020-12-15 DIAGNOSIS — R930 Abnormal findings on diagnostic imaging of skull and head, not elsewhere classified: Secondary | ICD-10-CM | POA: Diagnosis not present

## 2020-12-15 DIAGNOSIS — R0902 Hypoxemia: Secondary | ICD-10-CM | POA: Diagnosis not present

## 2020-12-15 DIAGNOSIS — J449 Chronic obstructive pulmonary disease, unspecified: Secondary | ICD-10-CM | POA: Diagnosis not present

## 2020-12-15 DIAGNOSIS — I5033 Acute on chronic diastolic (congestive) heart failure: Secondary | ICD-10-CM

## 2020-12-15 DIAGNOSIS — Z79899 Other long term (current) drug therapy: Secondary | ICD-10-CM | POA: Insufficient documentation

## 2020-12-15 DIAGNOSIS — Z7901 Long term (current) use of anticoagulants: Secondary | ICD-10-CM | POA: Diagnosis not present

## 2020-12-15 DIAGNOSIS — S5001XA Contusion of right elbow, initial encounter: Secondary | ICD-10-CM

## 2020-12-15 DIAGNOSIS — M7989 Other specified soft tissue disorders: Secondary | ICD-10-CM

## 2020-12-15 DIAGNOSIS — S20229A Contusion of unspecified back wall of thorax, initial encounter: Secondary | ICD-10-CM

## 2020-12-15 DIAGNOSIS — R600 Localized edema: Secondary | ICD-10-CM | POA: Insufficient documentation

## 2020-12-15 DIAGNOSIS — Y92002 Bathroom of unspecified non-institutional (private) residence single-family (private) house as the place of occurrence of the external cause: Secondary | ICD-10-CM | POA: Diagnosis not present

## 2020-12-15 DIAGNOSIS — I2510 Atherosclerotic heart disease of native coronary artery without angina pectoris: Secondary | ICD-10-CM | POA: Insufficient documentation

## 2020-12-15 DIAGNOSIS — I959 Hypotension, unspecified: Secondary | ICD-10-CM | POA: Diagnosis not present

## 2020-12-15 DIAGNOSIS — T1490XA Injury, unspecified, initial encounter: Secondary | ICD-10-CM

## 2020-12-15 DIAGNOSIS — R0689 Other abnormalities of breathing: Secondary | ICD-10-CM | POA: Diagnosis not present

## 2020-12-15 DIAGNOSIS — W19XXXA Unspecified fall, initial encounter: Secondary | ICD-10-CM | POA: Diagnosis not present

## 2020-12-15 MED ORDER — BACITRACIN ZINC 500 UNIT/GM EX OINT
1.0000 "application " | TOPICAL_OINTMENT | Freq: Two times a day (BID) | CUTANEOUS | Status: DC
  Administered 2020-12-15: 1 via TOPICAL
  Filled 2020-12-15: qty 1.8

## 2020-12-15 NOTE — ED Triage Notes (Signed)
Pt arrived via [facility] from home due to mechanical fall. Pt c/o neck pain and 2 lacerations/skin tears.  Pt discharged from [facility] yesterday for Afib and fluid overload. Family also complained to EMS that pt has been having lower extremity leaking since being discharged.

## 2020-12-15 NOTE — ED Notes (Signed)
Placed pt on malewick and given warm blanket

## 2020-12-15 NOTE — ED Provider Notes (Signed)
[facility] EMERGENCY DEPARTMENT Provider Note   CSN: [id number] Arrival date & time: 12/15/20  2126     History Chief Complaint  Patient presents with   Joshua [name] is a 75 y.o. male.   Fall   This patient is a 75 year old male who has a fairly complicated medical history including congestive heart failure, COPD, coronary disease, hypertension and hypercholesterolemia as well as persistent atrial fibrillation.  He is currently on Eliquis.  He presents to the hospital today with a complaint of a fall.  Review of the medical record shows that the patient has had a recent admission to the hospital on October 14 through yesterday when he was discharged from the hospital after being diuresed and dropping approximately 7 pounds of wet weight.  He was given a stool softener and a bottle of milk of magnesia prior to discharge yesterday and unfortunately has had multiple episodes of watery diarrhea today which has kept him going back and forth to the bathroom and making a mess around the house.  This evening he went to take a shower after unfortunately soiling his underclothes and had an accidental fall while he was in the shower.  The patient landed striking his right elbow and his back, the family members stated that he did not want to talk to them but the paramedics reported that he answered their questions including telling them his birthdate.  He initially had complained of some neck pain but at this time denies neck pain, he states he does not know how he fell, he does not want to talk about what happened to her about the diarrhea that he has been having, all of that information was obtained from the daughter who I called on the phone to obtain additional information.  Of note the patient does have fairly severe heart disease including multiple valvular abnormalities, he was deemed a nonsurgical candidate after CT surgery evaluated the patient during his last admission.  He is  currently taking diuretic therapy in fact he also takes medication for his chronic A. fib.  Review of his chart shows that he is currently taking Eliquis, dofetilide, furosemide 80 mg, omeprazole, rosuvastatin, spironolactone.  Of note the family members were concerned because his legs are still weeping especially the left leg where there is a small skin tear.  Paramedics placed the patient in a cervical collar during transport  Past Medical History:  Diagnosis Date   Acute lower GI bleeding    related to gastritis / viral infection   Cataract    Cirrhosis ([place])    Congestive heart failure (CHF) ([place]) 11/21/2020   COPD (chronic obstructive pulmonary disease) ([place])    PFT 05-05-2010, FEV1 .9 (26%) ratio 60   Coronary artery disease    Gastritis 06-10-2006   Hiatal hernia 06-10-2006   EGD   HOH (hard of hearing)    Hx of adenomatous colonic polyps 2005, 2013   Colonoscopy   Hypercholesteremia    Hypertension    Hypertr obst cardiomyop    mild subaortic stenosis   Iron deficiency anemia, unspecified    LVH (left ventricular hypertrophy)    Other B-complex deficiencies    Persistent atrial fibrillation (HCC)    persistent   Rheumatic fever    Thrombocytopenia ([place]) 05/2008    Patient Active Problem List   Diagnosis Date Noted   Acute on chronic diastolic CHF (congestive heart failure), NYHA class 4 ([place]) 12/09/2020   Acute on chronic combined systolic  and diastolic CHF, NYHA class 4 ([place]) 12/09/2020   Acute on chronic diastolic CHF (congestive heart failure) ([place]) 12/09/2020   Mitral regurgitation 11/29/2020   Aortic stenosis 11/29/2020   Tricuspid regurgitation 11/29/2020   Congestive heart failure (CHF) ([place]) 11/21/2020   Acute on chronic diastolic (congestive) heart failure ([place]) 11/21/2020   Chronic diastolic heart failure ([place]) 10/13/2020   Chronic obstructive pulmonary disease, unspecified ([place]) 09/14/2020   Splenic infarct    Rectal bleeding 09/05/2020   Adenomatous polyps  12/13/2016   Long term (current) use of anticoagulants [Z79.01] 06/14/2016   CAD in native artery 01/17/2016   HOCM (hypertrophic obstructive cardiomyopathy) ([place]) 07/16/2015   Hepatic cirrhosis ([place])    Syncope and collapse 01/14/2015   Erectile dysfunction 11/10/2014   Thrombocytopenia ([place]) 01/30/2014   Hyperlipidemia LDL goal <70 12/03/2013   High risk medication use 05/21/2013   BPH (benign prostatic hyperplasia) 01/29/2013   Family history of colon cancer 01/29/2013   PAF (paroxysmal atrial fibrillation) ([place]) 12/17/2012   Aortic valve insufficiency 12/17/2012   Mild mitral stenosis 12/17/2012   COPD (chronic obstructive pulmonary disease) ([place]) 05/05/2010   Hypertrophic obstructive cardiomyopathy ([place]) 03/31/2010   B12 deficiency 10/11/2008   Coronary atherosclerosis 10/11/2008   HIATAL HERNIA WITH REFLUX 10/11/2008   COLONIC POLYPS, ADENOMATOUS, HX OF 10/11/2008    Past Surgical History:  Procedure Laterality Date   ATRIAL FLUTTER ABLATION  06/2010   CTI ablation performed by [name]   CARDIAC CATHETERIZATION  05/28/2005   Widely patent coronaries and normal LV function.   CARDIAC CATHETERIZATION  08/11/2001   80% LAD stenosis successfully stented with a 3.5x60mm Cypher DES postdilated to 3.54mm resulting in reduction of 80% to 0%.   CARDIAC CATHETERIZATION  01/15/2001   Focal 95% proximal RCA stenosis, stented with a 3x44mm Zeta multilink stent with postdilatation utilizing a 3.5x71mm Quantum balloon with stenosis being reduced to 0%.   CARDIOVASCULAR STRESS TEST  05/29/2011   No scintigraphic evidence of inducible myocardial ischemia. No ECG changes. EKG negative for ischemia.   CARDIOVERSION N/A 10/20/2019   Procedure: CARDIOVERSION;  Surgeon: Sanda Klein, MD;  Location: [facility] ENDOSCOPY;  Service: Cardiovascular;  Laterality: N/A;   CARDIOVERSION N/A 01/14/2020   Procedure: CARDIOVERSION;  Surgeon: Buford Dresser, MD;  Location: [facility] ENDOSCOPY;  Service:  Cardiovascular;  Laterality: N/A;   CORONARY ANGIOPLASTY     2002-2003   SHOULDER SURGERY     TRANSTHORACIC ECHOCARDIOGRAM  05/29/2011   EF >70%, severe concentric LV hypertrophy, severe mitral annular calcification, mild-moderate aortic regurg,        Family History  Problem Relation Age of Onset   Heart attack Brother 10       Ventircular rupture   Emphysema Brother    Lung cancer Brother    Heart disease Mother        Enlarged heart   Stroke Mother    Hyperlipidemia Father    CAD Father    Hypertension Sister    CAD Paternal Uncle    Stroke Paternal Uncle    Cancer Maternal Grandfather        colon   Colon cancer Maternal Grandfather    Asthma Brother     Social History   Tobacco Use   Smoking status: Former    Packs/day: 1.00    Years: 10.00    Pack years: 10.00    Types: Cigarettes    Quit date: 02/26/1985    Years since quitting: 35.8   Smokeless tobacco: Never  Vaping Use  Vaping Use: Never used  Substance Use Topics   Alcohol use: No   Drug use: No    Home Medications Prior to Admission medications   Medication Sig Start Date End Date Taking? Authorizing Provider  apixaban (ELIQUIS) 5 MG TABS tablet Take 1 tablet (5 mg total) by mouth 2 (two) times daily. 12/10/19   Allred, Jeneen Rinks, MD  cholecalciferol (VITAMIN D) 1000 units tablet Take 1,000 Units by mouth daily.    [provider]  cyanocobalamin ([facility] VITAMIN B12) 1000 MCG tablet Take 1 tablet (1,000 mcg total) by mouth daily. 11/24/14   Cherre Robins, PharmD  dofetilide (TIKOSYN) 250 MCG capsule Take 250 mcg by mouth 2 (two) times daily.    [provider]  furosemide (LASIX) 80 MG tablet Take 80 mg by mouth daily as needed for fluid or edema.    [provider]  omeprazole (PRILOSEC) 10 MG capsule TAKE ONE CAPSULE BY MOUTH ONE TIME DAILY Patient taking differently: Take 10 mg by mouth daily. 07/06/20   [name], [name], MD  polyethylene glycol (MIRALAX / GLYCOLAX) packet  Take 17 g by mouth daily as needed for mild constipation.    [provider]  potassium chloride SA (KLOR-CON) 20 MEQ tablet Take 20 mEq by mouth daily as needed (when taking Furosemide).    [provider]  rosuvastatin (CRESTOR) 10 MG tablet Take 1 tablet (10 mg total) by mouth daily. Patient taking differently: Take 10 mg by mouth at bedtime. 05/03/16   Chipper Herb, MD  spironolactone (ALDACTONE) 25 MG tablet Take 0.5 tablets (12.5 mg total) by mouth daily. 12/15/20   Cheryln Manly, NP  tamsulosin (FLOMAX) 0.4 MG CAPS capsule Take 1 capsule (0.4 mg total) by mouth daily. Patient not taking: Reported on 12/09/2020 06/13/20   [name], [name], MD    Allergies    Patient has no known allergies.  Review of Systems   Review of Systems  All other systems reviewed and are negative.  Physical Exam Updated Vital Signs BP 95/69   Pulse 63   Temp 98 F (36.7 C) (Oral)   Resp (!) 24   Ht 1.727 m (5\' 8" )   Wt 75.2 kg   SpO2 100%   BMI 25.21 kg/m   Physical Exam Vitals and nursing note reviewed.  Constitutional:      General: He is not in acute distress.    Appearance: He is well-developed.  HENT:     Head: Normocephalic and atraumatic.     Comments: There is no signs of head trauma, no contusions or tenderness or hematomas lacerations or abrasions.    Nose: Nose normal. No congestion or rhinorrhea.     Mouth/Throat:     Pharynx: No oropharyngeal exudate.  Eyes:     General: No scleral icterus.       Right eye: No discharge.        Left eye: No discharge.     Conjunctiva/sclera: Conjunctivae normal.     Pupils: Pupils are equal, round, and reactive to light.  Neck:     Thyroid: No thyromegaly.     Vascular: No JVD.  Cardiovascular:     Rate and Rhythm: Normal rate. Rhythm irregular.     Heart sounds: Murmur heard.    No friction rub. No gallop.  Pulmonary:     Effort: Pulmonary effort is normal. No respiratory distress.     Breath sounds: Normal  breath sounds. No wheezing or rales.  Abdominal:  General: Bowel sounds are normal. There is no distension.     Palpations: Abdomen is soft. There is no mass.     Tenderness: There is no abdominal tenderness.  Musculoskeletal:        General: No tenderness. Normal range of motion.     Cervical back: Normal range of motion and neck supple.     Right lower leg: Edema present.     Left lower leg: Edema present.     Comments: Small skin tear to the right elbow, bilateral lower extremities with some weeping mostly on the left.  There is 1-2+ pitting edema bilaterally below the knees.  Mild tenderness over the cervical spine as well as the thoracic spine, there is a small skin tear to the back  Lymphadenopathy:     Cervical: No cervical adenopathy.  Skin:    General: Skin is warm and dry.     Findings: No erythema or rash.     Comments: Skin tears as noted  Neurological:     Mental Status: He is alert.     Coordination: Coordination normal.     Comments: The patient is awake and alert and able to follow commands, he is hard of hearing but able to move all 4 extremities without weakness  Psychiatric:        Behavior: Behavior normal.    ED Results / Procedures / Treatments   Labs (all labs ordered are listed, but only abnormal results are displayed) Labs Reviewed - No data to display  EKG None  Radiology DG Thoracic Spine 2 View  Result Date: 12/15/2020 CLINICAL DATA:  Fall, back pain EXAM: THORACIC SPINE 2 VIEWS COMPARISON:  None. FINDINGS: Normal thoracic kyphosis. No evidence of fracture or dislocation. Vertebral body heights are maintained. Suspected small bilateral pleural effusions, incompletely visualized. IMPRESSION: Negative. Electronically Signed   By: Julian Hy M.D.   On: 12/15/2020 23:05   DG Elbow Complete Right  Result Date: 12/15/2020 CLINICAL DATA:  Fall EXAM: RIGHT ELBOW - COMPLETE 3+ VIEW COMPARISON:  None. FINDINGS: No fracture or dislocation is seen.  The joint spaces are preserved. The visualized soft tissues are unremarkable. No displaced elbow joint fat pads to suggest an elbow joint effusion. IMPRESSION: Negative. Electronically Signed   By: Julian Hy M.D.   On: 12/15/2020 23:06   CT Head Wo Contrast  Result Date: 12/15/2020 CLINICAL DATA:  [place], neck pain EXAM: CT HEAD WITHOUT CONTRAST TECHNIQUE: Contiguous axial images were obtained from the base of the skull through the vertex without intravenous contrast. COMPARISON:  None. FINDINGS: Brain: Focal hypodensity in the left frontal white matter consistent with chronic small vessel ischemic change. No signs of acute infarct or hemorrhage. Lateral ventricles and midline structures are unremarkable. No acute extra-axial fluid collections. No mass effect. Vascular: Mild atherosclerosis.  No hyperdense vessel. Skull: Normal. Negative for fracture or focal lesion. Sinuses/Orbits: Mild mucosal thickening within the maxillary and ethmoid air cells. Other: None. IMPRESSION: 1. No acute intracranial process. Electronically Signed   By: Randa Ngo M.D.   On: 12/15/2020 22:43   CT Cervical Spine Wo Contrast  Result Date: 12/15/2020 CLINICAL DATA:  [place], lacerations EXAM: CT CERVICAL SPINE WITHOUT CONTRAST TECHNIQUE: Multidetector CT imaging of the cervical spine was performed without intravenous contrast. Multiplanar CT image reconstructions were also generated. COMPARISON:  None. FINDINGS: Alignment: Alignment is grossly anatomic. Skull base and vertebrae: No acute fracture. No primary bone lesion or focal pathologic process. Soft tissues and spinal canal: No prevertebral  fluid or swelling. No visible canal hematoma. Disc levels: Mild spondylosis at C5-6. Mild facet hypertrophic changes from C2 through C4. Upper chest: Scattered areas of ground-glass airspace disease are seen at the apices, with interlobular septal thickening, favor pulmonary edema. Central airway is patent. Other: Reconstructed  images demonstrate no additional findings. IMPRESSION: 1. No acute cervical spine fracture. 2. Mild spondylosis and facet hypertrophy. 3. Findings at the lung apices most consistent with mild edema. Electronically Signed   By: Randa Ngo M.D.   On: 12/15/2020 22:40    Procedures Procedures   Medications Ordered in ED Medications  bacitracin ointment 1 application (has no administration in time range)    ED Course  I have reviewed the triage vital signs and the nursing notes.  Pertinent labs & imaging results that were available during my care of the patient were reviewed by me and considered in my medical decision making (see chart for details).    MDM Rules/Calculators/A&P                           I discussed the patient's care with his daughter, she is the demographic listed next of kin.  She agrees that he is DO NOT RESUSCITATE, the paperwork is here with him.  I think that he would benefit from having his legs wrapped with a compression dressing and Coban.  I think he needs x-rays of his thoracic spine and right elbow as well as his head and cervical spine to make sure that there is no significant injuries.  The patient is living independently, he has home health that is post to be coming in tomorrow, he has been having diarrhea secondary to the laxatives he was given prior to discharge which is likely the cause of his fall ultimately.  His vital signs are otherwise unremarkable, his heart rate for me is under 100 and he is afebrile and has a normal oxygen level on his home oxygen.  The patient and the family members are in agreement with the plan  Xrays are negative  I have personally viewed and interpreted the x-rays which I ordered to look for signs of fracture of the elbow the spine and the cervical spine and the head.  There is no signs of bleeding.  Compression dressing is on the legs.  Patient is very stable and though I suspect he will continue to have diarrhea he is able  to go home.  Final Clinical Impression(s) / ED Diagnoses Final diagnoses:  Diarrhea, unspecified type  Contusion of right elbow, initial encounter  Contusion of back, unspecified laterality, initial encounter  Leg swelling    Rx / DC Orders ED Discharge Orders     None        Noemi Chapel, MD 12/15/20 2308

## 2020-12-15 NOTE — Patient Outreach (Signed)
[facility]) Care Management  12/15/2020  Joshua [name] 07/17/1945 [id number]  [facility] [AC[facility]] Patient: [name]  Follow up: Patient transitioned home with home health, less than 30 days hospital readmission with HF exacerbation patient transitioned home late yesterday  Plan:  Offer patient Chronic Care Management with PCP, [facility] an Embedded provider  Natividad Brood, RN BSN [facility] Liaison  [phone] business mobile phone Toll free office [phone]  Fax number: [phone] [email or web address]

## 2020-12-15 NOTE — Discharge Instructions (Signed)
Your x-rays show no signs of broken bone The x-rays of your head and your neck show no signs of broken bones or bleeding around your brain Unfortunately your legs have some small tears in the skin which is allowing them to weep and leak fluid.  This is okay and is of no concern.  Please make sure that you are taking your fluid pills to continue to urinate off and get rid of some of that extra fluid We have wrapped her legs with a compression dressing, you will want to get the following materials from your pharmacy   Kerlex wrap (wide)  Coban - tacky wrap   You may use a topical antibiotic on your skin tears, they will heal by themselves over the next couple of weeks. I would recommend that you get lots of bedrest over the next couple of days drinking plenty of clear liquids, the diarrhea may continue for another day or 2 You may return to the emergency department for any severe worsening symptoms or any other concern

## 2020-12-15 NOTE — Interval H&P Note (Signed)
History and Physical Interval Note:  Attestation signed by Pixie Casino, MD at 12/09/2020  3:12 PM   Pt. Seen and examined. Agree with the Resident/NP/PA-C note as written.  This is a pleasant 75 year old male patient of Dr. Claiborne Billings with a history of chronic diastolic heart failure.  He was recently admitted between 9/26 and 11/29/2020 for acute on chronic diastolic congestive heart failure.  He was diuresed, given Diamox and started on Jardiance however had an increase in his creatinine and this was discontinued.  Palliative care was involved and apparently had been listed as DNR however now wants to be full code.  Losartan and spironolactone were discontinued due to hypotension.  His medications were additionally adjusted.  He now presents today with progressive dyspnea on exertion, weight gain of 8 pounds, fatigue, and hypoxia with O2 sat on supplemental oxygen of 80%.  He reports some degree of noncompliance with medications.  He has had progressive confusion.  He has not been compliant with his diet.  It sounds like he is failing to thrive at home.  On physical exam he appears ill, in mild distress, some shortness of breath, hypoxic with dusky skin color, lungs decreased breath sounds including dullness to percussion about halfway up the left lung base suggestive of effusion, heart regular, there is a 3 out of 6 systolic murmur heard best at the left upper sternal border, abdomen soft, nontender extremities with anasarca/4+ edema pitting up to the thighs, awake and orient x3, follows commands.   This is a pleasant gentleman with acute on chronic diastolic heart failure, recently hospitalized for similar, with recent weight gain, hypoxia had significant volume overload which will necessitate admission for diuresis.  He wishes to be full code.  He has previously been seen by palliative care who should be involved during his hospitalization.  Options for management may be limited.  He is agreeable to a  direct admission.   Time Spent with Patient: I have spent a total of 45 minutes with patient reviewing hospital notes, telemetry, EKGs, labs and examining the patient as well as establishing an assessment and plan that was discussed with the patient.  > 50% of time was spent in direct patient care.   Pixie Casino, MD, [facility], [place] Director of the [facility] Lipid [facility] &  Cardiovascular Risk Reduction Clinic Diplomate of the American Board of Clinical Lipidology Attending Cardiologist  Direct Dial: [phone]  Fax: [phone]  Website:  [email or web address]                  Expand All Collapse All History and Physical Cardiology      Date:  12/08/2020    ID:  HUGHES WYNDHAM, DOB April 23, 1945, MRN [id number]   PCP:  [name], [name], MD           Cardiologist:  Dr. Claiborne Billings  CC: Follow Up Hospitalization    History of Present Illness: AGASTYA MEISTER is a 75 y.o. male who presents for posthospitalization follow-up after admission between 11/21/2020 and 11/29/2020 for acute on chronic diastolic heart failure.  He presented with complaints of "feeling blah", dry cough, leg edema.  He admitted to not being very active.  The chest x-ray revealed small bilateral effusions, with a 4.8 x 3.2 cm opacity over the left heart with recommendations for follow-up chest x-ray.  He was given Diamox 250 mg x 2.  He was started on Jardiance however his creatinine increased and therefore  this was discontinued.  The patient is now on palliative care.  Losartan and spironolactone were discontinued due to hypotension.   The patient's Tikosyn was reduced from 500 mcg twice daily to 250 mcg twice daily in the setting of episodes of bradycardia with rates in the 30s.  On discharge she converted into atrial flutter with variable block.  It was noted that he will not be able to maintain normal sinus rhythm with underlying valvular heart disease.   For CAD he was to  continue DOAC but aspirin was discontinued.  He was continued on statin therapy.  The patient was provided with oxygen 3 L per nasal cannula which she was using at home prior to admission.   Therefore, during hospitalization the patient did have a cardiac MRI on 11/25/2020 which also revealed moderate to severe aortic valve regurgitation with regurgitant fraction of 32% (35% cutoff for severe AI and cardiac MRI).  He also had moderate to severe tricuspid valve regurgitation with regurgitant fraction 38%, (40% cutoff for severe TR).   [name] has a history of HFpEF, coronary artery disease status post prior PCI to the LAD and RCA, moderate MR MS, A AR, with persistent atrial fibrillation/flutter, hypertension, COPD on home O2.  He was also recently admitted for lower GI bleed in July 2022 which was secondary to internal hemorrhoids.  He would therefore was continued on Eliquis for CVA prevention.   [name] comes today with a friend who is concerned about him.  He has become more short of breath despite his oxygen at 3 L, having lower extremity edema up into the thighs, and having increased confusion.  She states that she has noticed that he is taking his medicines on some days, other days he is taking extra doses, and is inconsistent concerning compliance.  She also states that he is not on a low-sodium diet.   She is also concerned because he has 2 daughters who are his main caregivers, who are not as attentive.  Although this cannot be confirmed.  Both daughters are local.   The patient complains of lower extremity edema, feeling like his legs are heavy, I had to place his oxygen on his nose on several occasions during the office encounter.  He does have some mild confusion.  Oxygen saturations have been between 80% and 82% per O2 sat monitor.         Past Medical History:  Diagnosis Date   Acute lower GI bleeding      related to gastritis / viral infection   Cataract     Cirrhosis ([place])      Congestive heart failure (CHF) ([place]) 11/21/2020   COPD (chronic obstructive pulmonary disease) ([place])      PFT 05-05-2010, FEV1 .9 (26%) ratio 60   Coronary artery disease     Gastritis 06-10-2006   Hiatal hernia 06-10-2006    EGD   HOH (hard of hearing)     Hx of adenomatous colonic polyps 2005, 2013    Colonoscopy   Hypercholesteremia     Hypertension     Hypertr obst cardiomyop      mild subaortic stenosis   Iron deficiency anemia, unspecified     LVH (left ventricular hypertrophy)     Other B-complex deficiencies     Persistent atrial fibrillation (HCC)      persistent   Rheumatic fever     Thrombocytopenia ([place]) 05/2008           Past Surgical History:  Procedure Laterality Date   ATRIAL FLUTTER ABLATION   06/2010    CTI ablation performed by [name]   CARDIAC CATHETERIZATION   05/28/2005    Widely patent coronaries and normal LV function.   CARDIAC CATHETERIZATION   08/11/2001    80% LAD stenosis successfully stented with a 3.5x20mm Cypher DES postdilated to 3.5mm resulting in reduction of 80% to 0%.   CARDIAC CATHETERIZATION   01/15/2001    Focal 95% proximal RCA stenosis, stented with a 3x53mm Zeta multilink stent with postdilatation utilizing a 3.5x53mm Quantum balloon with stenosis being reduced to 0%.   CARDIOVASCULAR STRESS TEST   05/29/2011    No scintigraphic evidence of inducible myocardial ischemia. No ECG changes. EKG negative for ischemia.   CARDIOVERSION N/A 10/20/2019    Procedure: CARDIOVERSION;  Surgeon: Sanda Klein, MD;  Location: [facility];  Service: Cardiovascular;  Laterality: N/A;   CARDIOVERSION N/A 01/14/2020    Procedure: CARDIOVERSION;  Surgeon: Buford Dresser, MD;  Location: [facility] ENDOSCOPY;  Service: Cardiovascular;  Laterality: N/A;   CORONARY ANGIOPLASTY        2002-2003   SHOULDER SURGERY       TRANSTHORACIC ECHOCARDIOGRAM   05/29/2011    EF >70%, severe concentric LV hypertrophy, severe mitral annular calcification, mild-moderate  aortic regurg,               Current Outpatient Medications  Medication Sig Dispense Refill   apixaban (ELIQUIS) 5 MG TABS tablet Take 1 tablet (5 mg total) by mouth 2 (two) times daily. 180 tablet 1   cholecalciferol (VITAMIN D) 1000 units tablet Take 1,000 Units by mouth daily.       cyanocobalamin ([employer]S VITAMIN B12) 1000 MCG tablet Take 1 tablet (1,000 mcg total) by mouth daily.       dofetilide (TIKOSYN) 250 MCG capsule Take 1 capsule (250 mcg total) by mouth 2 (two) times daily. 60 capsule 2   furosemide (LASIX) 20 MG tablet Take 20 mg by mouth 2 (two) times daily.       omeprazole (PRILOSEC) 10 MG capsule TAKE ONE CAPSULE BY MOUTH ONE TIME DAILY (Patient taking differently: Take 10 mg by mouth daily.) 90 capsule 0   polyethylene glycol (MIRALAX / GLYCOLAX) packet Take 17 g by mouth as needed for mild constipation.  (Patient not taking: Reported on 11/22/2020)       rosuvastatin (CRESTOR) 10 MG tablet Take 1 tablet (10 mg total) by mouth daily. 90 tablet 3   tamsulosin (FLOMAX) 0.4 MG CAPS capsule Take 1 capsule (0.4 mg total) by mouth daily. 90 capsule 3    No current facility-administered medications for this visit.      Allergies:   Patient has no known allergies.      Social History:  The patient  reports that he quit smoking about 35 years ago. His smoking use included cigarettes. He has a 10.00 pack-year smoking history. He has never used smokeless tobacco. He reports that he does not drink alcohol and does not use drugs.    Family History:  The patient's family history includes Asthma in his brother; CAD in his father and paternal uncle; Cancer in his maternal grandfather; Colon cancer in his maternal grandfather; Emphysema in his brother; Heart attack (age of onset: 13) in his brother; Heart disease in his mother; Hyperlipidemia in his father; Hypertension in his sister; Lung cancer in his brother; Stroke in his mother and paternal uncle.      ROS: All other systems are  reviewed  and negative. Unless otherwise mentioned in H&P      PHYSICAL EXAM: VS:  There were no vitals taken for this visit. , BMI There is no height or weight on file to calculate BMI. GEN: Well nourished, well developed, in no acute distress HEENT: normal Neck: no JVD, carotid bruits, or masses Cardiac: Irregular RRR; 2/6 systolic murmurs, rubs, or gallops, 2+ to 3+ pitting edema pretibially and bilateral thigh edema, mild testicular edema Respiratory: Absent breath sounds bilaterally with crackles mid lung, wearing oxygen via nasal cannula at 3 L. GI: soft, nontender, nondistended, + BS MS: no deformity or atrophy, massive edema noted bilaterally. Skin: warm and dry, no rash, pale Neuro:  Strength and sensation are intact Psych: euthymic mood, full affect, uncertain with some confused responses.     EKG:  EKG is not ordered today.       Recent Labs: 11/21/2020: ALT 10; B Natriuretic Peptide 391.0; Hemoglobin 13.3; Platelets 126 11/24/2020: Magnesium 2.7 11/28/2020: BUN 25; Creatinine, Ser 0.97; Potassium 5.0; Sodium 141      Lipid Panel Labs (Brief)          Component Value Date/Time    CHOL 149 06/13/2020 1125    CHOL 126 08/20/2012 0930    TRIG 59 06/13/2020 1125    TRIG 113 10/31/2016 0850    TRIG 94 08/20/2012 0930    HDL 79 06/13/2020 1125    HDL 46 10/31/2016 0850    HDL 41 08/20/2012 0930    CHOLHDL 1.9 06/13/2020 1125    LDLCALC 58 06/13/2020 1125    LDLCALC 63 09/29/2013 0907    LDLCALC 66 08/20/2012 0930             Wt Readings from Last 3 Encounters:  11/29/20 167 lb 12.8 oz (76.1 kg)  11/21/20 169 lb (76.7 kg)  11/07/20 169 lb 12.1 oz (77 kg)        Other studies Reviewed: Cardiac MRI: 11/01/2020 cMRI showed severe ASSH c/w HOCM and patchy LGE with mild LVE and normal LVF, mild RVE and normal RVF, moderate to severe AR with RF 32%, moderate to severe TR (RF 38%) and moderate MR (RF 35%) and bilateral LL airspace disease.   -- seen by Dr.  Chipper Oman not a candidate for SAVR for mitral valve or TV disease and feel that replacing his AV for severe AR will not improve his situation given his underlying severe PHTN and severe MV disease   Echocardiogram 10/05/2020 1. Left ventricular ejection fraction, by estimation, is 65 to 70%. The  left ventricle has normal function. The left ventricle has no regional  wall motion abnormalities. There is moderate asymmetric left ventricular  hypertrophy of the basal-septal  segment (15 mm). No dynamic LVOT obstruction demonstrated on this exam.  Left ventricular diastolic parameters are indeterminate.   2. Right ventricular systolic function is mildly reduced. The right  ventricular size is mildly enlarged. There is severely elevated pulmonary  artery systolic pressure. The estimated right ventricular systolic  pressure is 95.1 mmHg.   3. Left atrial size was mild to moderately dilated.   4. The mitral valve is degenerative. Moderate mitral valve regurgitation.  Moderate to severe mitral stenosis. The mean mitral valve gradient is 7.0  mmHg with average heart rate of 55 bpm. Severe mitral annular  calcification.   5. Tricuspid valve regurgitation is moderate to severe.   6. The aortic valve is abnormal. There is mild calcification of the  aortic valve. Aortic valve regurgitation is severe.  Mild aortic valve  stenosis. Aortic valve mean gradient measures 13.4 mmHg.   7. Pulmonic valve regurgitation is moderate.   8. The inferior vena cava is normal in size with <50% respiratory  variability, suggesting right atrial pressure of 8 mmHg.    ASSESSMENT AND PLAN:   1.  Acute on chronic diastolic CHF: Patient has significant volume overload.  He will require admission for IV diuretics.  He has gained approximately 7 to 8 pounds since discharge, 10 days ago.  The patient has just been discharged on November 29, 2020 for similar diagnoses.  He has been inconsistent on taking his medications and  with low-sodium diet.  Friend who has brought him is concerned about his ability to take care of himself alone although he does have 2 daughters who are local who are caregivers.   I have discussed this with Dr. Debara Pickett, DOD at [facility] office today, who was also seen and examined the patient.  He is in agreement with the need for direct admit to [facility] for IV diuresis.   It is my concern that the patient may not be able to return home and take care of himself on his own and therefore social services will need to become involved to consider skilled nursing home placement.   2.  Severe valvular heart disease: Mild aortic valve stenosis with severe aortic valve regurgitation, moderate to severe mitral valve stenosis with severe TR.  Per cardiac MRI completed 11/01/2020.   3.  Atrial fibrillation: The patient is on Tikosyn 250 mg twice daily and Eliquis 5 mg twice daily.  Heart rate remains irregular.  He denies any evidence of bleeding hemoptysis or hematemesis.   4.  CAD: Most recent cardiac catheterization 05/28/2005 with widely patent coronaries and normal LV function, prior history of 80% LAD stenosis with stent using a 3.5 x 23 mm Cypher DES.Marland Kitchen  He denies any chest pain at this time.   5.  COPD: Oxygen dependent at 3 L per nasal cannula.  The patient had a history of bilateral pleural effusions during recent hospitalization and lung sounds are absent on the bases.  Repeating chest x-ray on admission.  The patient O2 sats were at 80% to 82% in the office on oxygen.  Will likely need to have increased oxygen delivery versus BiPAP.   6.   Hypercholesterolemia: Remains on statin therapy rosuvastatin 10 mg daily.   7.  Mild confusion: May be related to hypoxic encephalopathy.  Assessment will need to continue throughout hospitalization.   8.  DNR: Status has been confirmed on review of chart most recently documented on 12/01/2020.   Current medicines are reviewed at length with the patient  today.  I have spent 610 minutes dedicated to the care of this patient on the date of this encounter to include pre-visit review of records, assessment, management and diagnostic testing,with shared decision making.  I have also spoken with Dr. Debara Pickett, DOD at [facility] office today who has seen and examined the patient and agrees with need for admission.   Labs/ tests ordered today include: Direct Admission with diagnosis of Acute on Chronic Diastolic CHF.      Phill Myron. West Pugh, ANP, AACC   12/08/2020 12:06 PM    [facility] [place] [address] Office [phone] Fax [phone]   Notice: This dictation was prepared with Dragon dictation along with smaller phrase technology. Any transcriptional errors that result from this process are unintentional and  may not be corrected upon review.

## 2020-12-16 ENCOUNTER — Telehealth: Payer: Self-pay | Admitting: Family Medicine

## 2020-12-16 NOTE — Telephone Encounter (Signed)
Hosp follow up appt made

## 2020-12-21 ENCOUNTER — Other Ambulatory Visit: Payer: Self-pay

## 2020-12-21 ENCOUNTER — Ambulatory Visit (INDEPENDENT_AMBULATORY_CARE_PROVIDER_SITE_OTHER): Payer: Medicare PPO

## 2020-12-21 DIAGNOSIS — I421 Obstructive hypertrophic cardiomyopathy: Secondary | ICD-10-CM

## 2020-12-21 DIAGNOSIS — I110 Hypertensive heart disease with heart failure: Secondary | ICD-10-CM

## 2020-12-21 DIAGNOSIS — I4892 Unspecified atrial flutter: Secondary | ICD-10-CM | POA: Diagnosis not present

## 2020-12-21 DIAGNOSIS — K7460 Unspecified cirrhosis of liver: Secondary | ICD-10-CM

## 2020-12-21 DIAGNOSIS — J9612 Chronic respiratory failure with hypercapnia: Secondary | ICD-10-CM

## 2020-12-21 DIAGNOSIS — E876 Hypokalemia: Secondary | ICD-10-CM

## 2020-12-21 DIAGNOSIS — J449 Chronic obstructive pulmonary disease, unspecified: Secondary | ICD-10-CM

## 2020-12-21 DIAGNOSIS — K219 Gastro-esophageal reflux disease without esophagitis: Secondary | ICD-10-CM

## 2020-12-21 DIAGNOSIS — J9611 Chronic respiratory failure with hypoxia: Secondary | ICD-10-CM | POA: Diagnosis not present

## 2020-12-21 DIAGNOSIS — I5032 Chronic diastolic (congestive) heart failure: Secondary | ICD-10-CM | POA: Diagnosis not present

## 2020-12-21 DIAGNOSIS — E785 Hyperlipidemia, unspecified: Secondary | ICD-10-CM

## 2020-12-21 DIAGNOSIS — I083 Combined rheumatic disorders of mitral, aortic and tricuspid valves: Secondary | ICD-10-CM

## 2020-12-21 DIAGNOSIS — I5033 Acute on chronic diastolic (congestive) heart failure: Secondary | ICD-10-CM | POA: Diagnosis not present

## 2020-12-21 DIAGNOSIS — I4819 Other persistent atrial fibrillation: Secondary | ICD-10-CM

## 2020-12-21 DIAGNOSIS — Z515 Encounter for palliative care: Secondary | ICD-10-CM | POA: Diagnosis not present

## 2020-12-21 DIAGNOSIS — D696 Thrombocytopenia, unspecified: Secondary | ICD-10-CM

## 2020-12-21 DIAGNOSIS — I272 Pulmonary hypertension, unspecified: Secondary | ICD-10-CM

## 2020-12-21 DIAGNOSIS — I2510 Atherosclerotic heart disease of native coronary artery without angina pectoris: Secondary | ICD-10-CM

## 2020-12-23 ENCOUNTER — Ambulatory Visit (INDEPENDENT_AMBULATORY_CARE_PROVIDER_SITE_OTHER): Payer: Medicare PPO | Admitting: Family Medicine

## 2020-12-23 ENCOUNTER — Encounter: Payer: Self-pay | Admitting: Family Medicine

## 2020-12-23 ENCOUNTER — Other Ambulatory Visit: Payer: Self-pay

## 2020-12-23 VITALS — BP 130/58 | HR 77 | Ht 68.0 in | Wt 165.0 lb

## 2020-12-23 DIAGNOSIS — S20419A Abrasion of unspecified back wall of thorax, initial encounter: Secondary | ICD-10-CM | POA: Diagnosis not present

## 2020-12-23 DIAGNOSIS — S50311A Abrasion of right elbow, initial encounter: Secondary | ICD-10-CM

## 2020-12-23 DIAGNOSIS — R609 Edema, unspecified: Secondary | ICD-10-CM

## 2020-12-23 DIAGNOSIS — Z23 Encounter for immunization: Secondary | ICD-10-CM

## 2020-12-23 DIAGNOSIS — I5043 Acute on chronic combined systolic (congestive) and diastolic (congestive) heart failure: Secondary | ICD-10-CM

## 2020-12-23 NOTE — Progress Notes (Signed)
BP (!) 130/58   Pulse 77   Ht 5' 8"  (1.727 m)   Wt 165 lb (74.8 kg)   SpO2 96%   BMI 25.09 kg/m    Subjective:   Patient ID: Joshua [name], male    DOB: [birth year], 75 y.o.   MRN: [id number]  HPI: ROMYN [name] is a 75 y.o. male presenting on 12/23/2020 for Hospitalization Follow-up   HPI Patient was in the hospital on 12/16/2018 in the emergency department. He went to the hospital for a fall and prior to that he was in the hospital for CHF on 12/10/2018 until 12/15/2018 for CHF. Patient had a fall and has a small scrape on his left lower extremity and 1 on his back and one on the right elbow.  They do have home health wound care coming out to look at both and working with him currently.  His daughter is here with him who is helping him and says that his breathing has been better.  He is still having a lot of swelling in his left lower extremity and a little bit in his right lower extremity which is what he deals with with CHF but denies any breathing issues.  He is currently on 3 L nasal cannula oxygen and that his his baseline that he has been on before.  He has a higher level of diuretic currently.  Relevant past medical, surgical, family and social history reviewed and updated as indicated. Interim medical history since our last visit reviewed. Allergies and medications reviewed and updated.  Review of Systems  Constitutional:  Negative for chills and fever.  Respiratory:  Negative for shortness of breath and wheezing.   Cardiovascular:  Positive for leg swelling. Negative for chest pain.  Gastrointestinal:  Negative for abdominal pain.  Musculoskeletal:  Negative for arthralgias, back pain and gait problem.  Skin:  Positive for wound. Negative for color change and rash.  All other systems reviewed and are negative.  Per HPI unless specifically indicated above   Allergies as of 12/23/2020   No Known Allergies      Medication List        Accurate as of December 23, 2020 12:07 PM. If you have any questions, ask your nurse or doctor.          STOP taking these medications    omeprazole 10 MG capsule Commonly known as: PRILOSEC Stopped by: [name] Janelli Welling, MD       TAKE these medications    apixaban 5 MG Tabs tablet Commonly known as: ELIQUIS Take 1 tablet (5 mg total) by mouth 2 (two) times daily.   cholecalciferol 1000 units tablet Commonly known as: VITAMIN D Take 1,000 Units by mouth daily.   cyanocobalamin 1000 MCG tablet Commonly known as: CVS VITAMIN B12 Take 1 tablet (1,000 mcg total) by mouth daily.   dofetilide 250 MCG capsule Commonly known as: TIKOSYN Take 250 mcg by mouth 2 (two) times daily.   furosemide 80 MG tablet Commonly known as: LASIX Take 80 mg by mouth daily as needed for fluid or edema.   polyethylene glycol 17 g packet Commonly known as: MIRALAX / GLYCOLAX Take 17 g by mouth daily as needed for mild constipation.   potassium chloride SA 20 MEQ tablet Commonly known as: KLOR-CON Take 20 mEq by mouth daily as needed (when taking Furosemide).   rosuvastatin 10 MG tablet Commonly known as: CRESTOR Take 1 tablet (10 mg total) by mouth daily. What changed: when  to take this   spironolactone 25 MG tablet Commonly known as: ALDACTONE Take 0.5 tablets (12.5 mg total) by mouth daily.   tamsulosin 0.4 MG Caps capsule Commonly known as: FLOMAX Take 1 capsule (0.4 mg total) by mouth daily.         Objective:   BP (!) 130/58   Pulse 77   Ht 5' 8"  (1.727 m)   Wt 165 lb (74.8 kg)   SpO2 96%   BMI 25.09 kg/m   Wt Readings from Last 3 Encounters:  12/23/20 165 lb (74.8 kg)  12/15/20 165 lb 12.6 oz (75.2 kg)  12/14/20 165 lb 12.6 oz (75.2 kg)    Physical Exam Vitals and nursing note reviewed.  Constitutional:      General: He is not in acute distress.    Appearance: He is well-developed. He is not diaphoretic.  Eyes:     General: No scleral icterus.    Conjunctiva/sclera: Conjunctivae  normal.  Neck:     Thyroid: No thyromegaly.  Cardiovascular:     Rate and Rhythm: Normal rate and regular rhythm.     Heart sounds: Murmur (3 out of 6 at left second intercostal space crescendo decrescendo) heard.  Pulmonary:     Effort: Pulmonary effort is normal. No respiratory distress.     Breath sounds: Normal breath sounds. No wheezing.  Musculoskeletal:        General: Swelling (2+ left lower extremity and 1+ right lower extremity edema) present. Normal range of motion.     Cervical back: Neck supple.  Lymphadenopathy:     Cervical: No cervical adenopathy.  Skin:    General: Skin is warm and dry.     Findings: No rash.  Neurological:     Mental Status: He is alert and oriented to person, place, and time.     Coordination: Coordination normal.  Psychiatric:        Behavior: Behavior normal.   Wound care change instructions On left lower extremity cover open wound with a nonstick gauze on the outer left upper lower leg and then wrap from foot to knee with rolled gauze and then wrapped again with Coban creating compression as you go up starting from the foot up to the knee.  Change daily  For right elbow, no further dressing changes needed  For back abrasion, use petroleum jelly or petroleum infused gauze and then covered with simple gauze and skin tape, change daily   Assessment & Plan:   Problem List Items Addressed This Visit       Cardiovascular and Mediastinum   Congestive heart failure (CHF) ([place])   Relevant Orders   CBC with Differential/Platelet   CMP14+EGFR   Other Visit Diagnoses     Abrasion of right elbow, initial encounter    -  Primary   Abrasion of back, unspecified laterality, initial encounter       Peripheral edema       Relevant Orders   CBC with Differential/Platelet   CMP14+EGFR       Patient has home health doing wound care, gave explicit instructions on how to change dressings.  We will check CBC and chemistry panel because of increase  of diuretic today.  Also is planning to see cardiology next week  Follow up plan: Return if symptoms worsen or fail to improve, for follow-up with usual care visits every few months.  Counseling provided for all of the vaccine components Orders Placed This Encounter  Procedures   CBC with Differential/Platelet  [place] Nandana Krolikowski, MD [facility] 12/23/2020, 12:07 PM

## 2020-12-23 NOTE — Patient Instructions (Signed)
Wound care change instructions On left lower extremity cover open wound with a nonstick gauze on the outer left upper lower leg and then wrap from foot to knee with rolled gauze and then wrapped again with Coban creating compression as you go up starting from the foot up to the knee.  Change daily  For right elbow, no further dressing changes needed  For back abrasion, use petroleum jelly or petroleum infused gauze and then covered with simple gauze and skin tape, change daily

## 2020-12-24 LAB — CBC WITH DIFFERENTIAL/PLATELET
Basophils Absolute: 0 10*3/uL (ref 0.0–0.2)
Basos: 0 %
EOS (ABSOLUTE): 0 10*3/uL (ref 0.0–0.4)
Eos: 1 %
Hematocrit: 36.5 % — ABNORMAL LOW (ref 37.5–51.0)
Hemoglobin: 11.6 g/dL — ABNORMAL LOW (ref 13.0–17.7)
Immature Grans (Abs): 0 10*3/uL (ref 0.0–0.1)
Immature Granulocytes: 0 %
Lymphocytes Absolute: 0.5 10*3/uL — ABNORMAL LOW (ref 0.7–3.1)
Lymphs: 10 %
MCH: 31.9 pg (ref 26.6–33.0)
MCHC: 31.8 g/dL (ref 31.5–35.7)
MCV: 100 fL — ABNORMAL HIGH (ref 79–97)
Monocytes Absolute: 0.4 10*3/uL (ref 0.1–0.9)
Monocytes: 9 %
Neutrophils Absolute: 3.8 10*3/uL (ref 1.4–7.0)
Neutrophils: 80 %
Platelets: 115 10*3/uL — ABNORMAL LOW (ref 150–450)
RBC: 3.64 x10E6/uL — ABNORMAL LOW (ref 4.14–5.80)
RDW: 12.8 % (ref 11.6–15.4)
WBC: 4.7 10*3/uL (ref 3.4–10.8)

## 2020-12-24 LAB — CMP14+EGFR
ALT: 15 IU/L (ref 0–44)
AST: 25 IU/L (ref 0–40)
Albumin/Globulin Ratio: 1.3 (ref 1.2–2.2)
Albumin: 3.5 g/dL — ABNORMAL LOW (ref 3.7–4.7)
Alkaline Phosphatase: 75 IU/L (ref 44–121)
BUN/Creatinine Ratio: 31 — ABNORMAL HIGH (ref 10–24)
BUN: 32 mg/dL — ABNORMAL HIGH (ref 8–27)
Bilirubin Total: 1.4 mg/dL — ABNORMAL HIGH (ref 0.0–1.2)
CO2: 44 mmol/L (ref 20–29)
Calcium: 9.1 mg/dL (ref 8.6–10.2)
Chloride: 90 mmol/L — ABNORMAL LOW (ref 96–106)
Creatinine, Ser: 1.02 mg/dL (ref 0.76–1.27)
Globulin, Total: 2.8 g/dL (ref 1.5–4.5)
Glucose: 100 mg/dL — ABNORMAL HIGH (ref 70–99)
Potassium: 4.9 mmol/L (ref 3.5–5.2)
Sodium: 149 mmol/L — ABNORMAL HIGH (ref 134–144)
Total Protein: 6.3 g/dL (ref 6.0–8.5)
eGFR: 76 mL/min/{1.73_m2} (ref >59)

## 2020-12-28 NOTE — Progress Notes (Signed)
Cardiology Clinic Note   Patient Name: Joshua [name] Date of Encounter: 12/29/2020  Primary Care Provider:  [name], [name], MD Primary Cardiologist:  Shelva Majestic, MD  Patient Profile    Joshua [name] 75 year old male presents the clinic today for follow-up evaluation of his acute on chronic diastolic CHF  Past Medical History    Past Medical History:  Diagnosis Date   Acute lower GI bleeding    related to gastritis / viral infection   Cataract    Cirrhosis ([place])    Congestive heart failure (CHF) ([place]) 11/21/2020   COPD (chronic obstructive pulmonary disease) ([place])    PFT 05-05-2010, FEV1 .9 (26%) ratio 60   Coronary artery disease    Gastritis 06-10-2006   Hiatal hernia 06-10-2006   EGD   HOH (hard of hearing)    Hx of adenomatous colonic polyps 2005, 2013   Colonoscopy   Hypercholesteremia    Hypertension    Hypertr obst cardiomyop    mild subaortic stenosis   Iron deficiency anemia, unspecified    LVH (left ventricular hypertrophy)    Other B-complex deficiencies    Persistent atrial fibrillation ([place])    persistent   Rheumatic fever    Thrombocytopenia ([place]) 05/2008   Past Surgical History:  Procedure Laterality Date   ATRIAL FLUTTER ABLATION  06/2010   CTI ablation performed by [name]   CARDIAC CATHETERIZATION  05/28/2005   Widely patent coronaries and normal LV function.   CARDIAC CATHETERIZATION  08/11/2001   80% LAD stenosis successfully stented with a 3.5x8mm Cypher DES postdilated to 3.48mm resulting in reduction of 80% to 0%.   CARDIAC CATHETERIZATION  01/15/2001   Focal 95% proximal RCA stenosis, stented with a 3x45mm Zeta multilink stent with postdilatation utilizing a 3.5x48mm Quantum balloon with stenosis being reduced to 0%.   CARDIOVASCULAR STRESS TEST  05/29/2011   No scintigraphic evidence of inducible myocardial ischemia. No ECG changes. EKG negative for ischemia.   CARDIOVERSION N/A 10/20/2019   Procedure: CARDIOVERSION;  Surgeon: Sanda Klein, MD;  Location: [facility];  Service: Cardiovascular;  Laterality: N/A;   CARDIOVERSION N/A 01/14/2020   Procedure: CARDIOVERSION;  Surgeon: Buford Dresser, MD;  Location: [facility];  Service: Cardiovascular;  Laterality: N/A;   CORONARY ANGIOPLASTY     2002-2003   SHOULDER SURGERY     TRANSTHORACIC ECHOCARDIOGRAM  05/29/2011   EF >70%, severe concentric LV hypertrophy, severe mitral annular calcification, mild-moderate aortic regurg,     Allergies  No Known Allergies  History of Present Illness    Joshua [name] has a PMH of HFpEF, CAD status post PCI to his LAD and RCA, moderate MR, MS, AR, and persistent atrial fibrillation/flutter.  His PMH also includes HTN, COPD on home O2.  He was admitted to the hospital with GI bleed 7/22 secondary to internal hemorrhoids.  He was continued on his Eliquis for CVA prevention.  He was again admitted to the hospital 9/26 through 10/4 with complaints of feeling unwell.  He had a dry cough and lower extremity swelling.  He was sedentary at home.  A chest x-ray showed bilateral small effusions with 4.8 x 3.2 cm opacity over his left heart.  He was given Diamox 250 mg x 2.  He was started on Jardiance.  However, his creatinine increased and the medication was discontinued.  His losartan and spironolactone were discontinued due to hypotension.  He had been seen in the office on 12/09/2020.  He continued to complain of  lower extremity edema.  He was also noted to be hypoxic in the 80s.  There was some concern about noncompliance with his home medications.  He was directly admitted to [facility] for further management.  On admission his weight was 172 pounds.  He received diuresis and his discharge weight was around 165 pounds.  His creatinine improved during admission his admission to 1.2.  His BNP was 1067.  He was discharged on p.o. Lasix 80 mg daily.  He was tolerating spironolactone 12.5 mg daily.  It was felt that he may be able to resume his  ARB as outpatient if his renal function remained stable.  He presents to the clinic today for follow-up evaluation states he feels well today.  He presents with a family friend named Electrical engineer.  She shares concern about him driving.  We reviewed his ability to perform his ADLs.  He reports that he has grab handles in the shower and feels stable dressing himself as well as bathing.  He reports that he does have some trouble sitting and standing on his commode.  We talked about the advantages of a raised toilet seat and handles for his commode.  I recommended these.  He reports that he is drinking several cups of fluid per day.   I recommended that he reduce his fluid intake to 48 to 64 ounces and continue to take his furosemide.  He reports that he is eating a well-balanced diet.  When asked about his current diet he is a poor historian. We reviewed the importance of avoiding salt due to his lower extremity swelling.  He has been weighing himself regularly and reports that his scale at home shows his weight being around 165 pounds.  His weight in the clinic today is 170 pounds.  I also reviewed the importance of safe driving.  I provided a written note stating that at this time he continues to recover and due to his past medical history I recommended that he avoid operating motor vehicles at this time (note provided).  I gave him the salty 6 diet sheet, have asked him to start doing chair exercises, we will continue his current medication regimen, providing instructions for fluid restriction, and we will plan to see him back in 1 month.  I have ordered losartan 25 mg and will check a BMP in 1 week.  Today he denies chest pain, shortness of breath, lower extremity edema, fatigue, palpitations, melena, hematuria, hemoptysis, diaphoresis, weakness, presyncope, syncope, orthopnea, and PND.   Home Medications    Prior to Admission medications   Medication Sig Start Date End Date Taking? Authorizing Provider  apixaban  (ELIQUIS) 5 MG TABS tablet Take 1 tablet (5 mg total) by mouth 2 (two) times daily. 12/10/19   Allred, Jeneen Rinks, MD  cholecalciferol (VITAMIN D) 1000 units tablet Take 1,000 Units by mouth daily.    [provider]  cyanocobalamin ([employer] VITAMIN B12) 1000 MCG tablet Take 1 tablet (1,000 mcg total) by mouth daily. 11/24/14   Eckard, Tammy, RPH-CPP  dofetilide (TIKOSYN) 250 MCG capsule Take 250 mcg by mouth 2 (two) times daily.    [provider]  furosemide (LASIX) 80 MG tablet Take 80 mg by mouth daily as needed for fluid or edema.    [provider]  polyethylene glycol (MIRALAX / GLYCOLAX) packet Take 17 g by mouth daily as needed for mild constipation.    [provider]  potassium chloride SA (KLOR-CON) 20 MEQ tablet Take 20 mEq by  mouth daily as needed (when taking Furosemide).    [provider]  rosuvastatin (CRESTOR) 10 MG tablet Take 1 tablet (10 mg total) by mouth daily. Patient taking differently: Take 10 mg by mouth at bedtime. 05/03/16   Chipper Herb, MD  spironolactone (ALDACTONE) 25 MG tablet Take 0.5 tablets (12.5 mg total) by mouth daily. 12/15/20   Cheryln Manly, NP  tamsulosin (FLOMAX) 0.4 MG CAPS capsule Take 1 capsule (0.4 mg total) by mouth daily. 06/13/20   [name], [name], MD    Family History    Family History  Problem Relation Age of Onset   Heart attack Brother 60       Ventircular rupture   Emphysema Brother    Lung cancer Brother    Heart disease Mother        Enlarged heart   Stroke Mother    Hyperlipidemia Father    CAD Father    Hypertension Sister    CAD Paternal Uncle    Stroke Paternal Uncle    Cancer Maternal Grandfather        colon   Colon cancer Maternal Grandfather    Asthma Brother    He indicated that his mother is deceased. He indicated that his father is deceased. He indicated that his sister is alive. He indicated that only one of his six brothers is alive. He indicated that his maternal  grandfather is deceased. He indicated that his paternal grandfather is deceased. He indicated that his paternal uncle is deceased.  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  Tobacco Use   Smoking status: Former    Packs/day: 1.00    Years: 10.00    Pack years: 10.00    Types: Cigarettes    Quit date: 02/26/1985    Years since quitting: 35.8   Smokeless tobacco: Never  Vaping Use   Vaping Use: Never used  Substance and Sexual Activity   Alcohol use: No   Drug use: No   Sexual activity: Not on file  Other Topics Concern   Not on file  Social History Narrative   Daughter lives with him   Social Determinants of Health   Financial Resource Strain: Low Risk    Difficulty of Paying Living Expenses: Not hard at all  Food Insecurity: No Food Insecurity   Worried About Charity fundraiser in the Last Year: Never true   Arboriculturist in the Last Year: Never true  Transportation Needs: No Transportation Needs   Lack of Transportation (Medical): No   Lack of Transportation (Non-Medical): No  Physical Activity: Sufficiently Active   Days of Exercise per Week: 7 days   Minutes of Exercise per Session: 30 min  Stress: No Stress Concern Present   Feeling of Stress : Not at all  Social Connections: Moderately Integrated   Frequency of Communication with Friends and Family: More than three times a week   Frequency of Social Gatherings with Friends and Family: More than three times a week   Attends Religious Services: More than 4 times per year   Active Member of [employer] or Organizations: Yes   Attends Archivist Meetings: More than 4  times per year   Marital Status: Widowed  Human resources officer Violence: Not At Risk   Fear of  Current or Ex-Partner: No   Emotionally Abused: No   Physically Abused: No   Sexually Abused: No     Review of Systems    General:  No chills, fever, night sweats or weight changes.  Cardiovascular:  No chest pain, dyspnea on exertion, edema, orthopnea, palpitations, paroxysmal nocturnal dyspnea. Dermatological: No rash, lesions/masses Respiratory: No cough, dyspnea Urologic: No hematuria, dysuria Abdominal:   No nausea, vomiting, diarrhea, bright red blood per rectum, melena, or hematemesis Neurologic:  No visual changes, wkns, changes in mental status. All other systems reviewed and are otherwise negative except as noted above.  Physical Exam    VS:  Ht 5\' 8"  (1.727 m)   Wt 170 lb (77.1 kg)   BMI 25.85 kg/m  , BMI Body mass index is 25.85 kg/m. GEN: Well nourished, well developed, in no acute distress. HEENT: normal. Neck: Supple, no JVD, carotid bruits, or masses. Cardiac: RRR, no murmurs, rubs, or gallops. No clubbing, cyanosis, bilateral lower extremity +1-2 pitting edema.  Radials/DP/PT 2+ and equal bilaterally.  Respiratory:  Respirations regular and unlabored, clear to auscultation bilaterally. GI: Soft, nontender, nondistended, BS + x 4. MS: no deformity or atrophy. Skin: warm and dry, no rash. Neuro:  Strength and sensation are intact. Psych: Normal affect.  Accessory Clinical Findings    Recent Labs: 12/09/2020: B Natriuretic Peptide 1,067.4; TSH 3.142 12/11/2020: Magnesium 2.3 12/23/2020: ALT 15; BUN 32; Creatinine, Ser 1.02; Hemoglobin 11.6; Platelets 115; Potassium 4.9; Sodium 149   Recent Lipid Panel    Component Value Date/Time   CHOL 149 06/13/2020 1125   CHOL 126 08/20/2012 0930   TRIG 59 06/13/2020 1125   TRIG 113 10/31/2016 0850   TRIG 94 08/20/2012 0930   HDL 79 06/13/2020 1125   HDL 46 10/31/2016 0850   HDL 41 08/20/2012 0930   CHOLHDL 1.9 06/13/2020 1125   LDLCALC 58 06/13/2020 1125   LDLCALC 63 09/29/2013 0907   LDLCALC 66 08/20/2012  0930    ECG personally reviewed by me today-none today.  Echocardiogram 10/05/2020  IMPRESSIONS     1. Left ventricular ejection fraction, by estimation, is 65 to 70%. The  left ventricle has normal function. The left ventricle has no regional  wall motion abnormalities. There is moderate asymmetric left ventricular  hypertrophy of the basal-septal  segment (15 mm). No dynamic LVOT obstruction demonstrated on this exam.  Left ventricular diastolic parameters are indeterminate.   2. Right ventricular systolic function is mildly reduced. The right  ventricular size is mildly enlarged. There is severely elevated pulmonary  artery systolic pressure. The estimated right ventricular systolic  pressure is 69.6 mmHg.   3. Left atrial size was mild to moderately dilated.   4. The mitral valve is degenerative. Moderate mitral valve regurgitation.  Moderate to severe mitral stenosis. The mean mitral valve gradient is 7.0  mmHg with average heart rate of 55 bpm. Severe mitral annular  calcification.   5. Tricuspid valve regurgitation is moderate to severe.   6. The aortic valve is abnormal. There is mild calcification of the  aortic valve. Aortic valve regurgitation is severe. Mild aortic valve  stenosis. Aortic valve mean gradient measures 13.4 mmHg.   7. Pulmonic valve regurgitation is moderate.   8. The inferior vena cava is normal in size with <50% respiratory  variability, suggesting right atrial pressure of 8 mmHg.  Assessment & Plan  1.  HFpEF, HCM-generalized bilateral lower extremity nonpitting edema.  Weight today 170 pounds and reports his home weight is 165 pounds.  Weight at recent discharge around 165 pounds.  No increased DOE or activity intolerance.  Has been slowly increasing his physical activity at home since discharge. Continue furosemide, spironolactone, Heart healthy low-sodium diet-salty 6 given Increase physical activity as tolerated Restart losartan 25 mg  daily Repeat BMP  in 1 week. Fluid restriction 48-64 ounces Daily weights-contact office with a weight increase of 3 pounds overnight or 5 pounds in 1 week  Severe mitral stenosis-has been seen and evaluated by T CTS.  Not a candidate for intervention or surgery.  During prior admission he was seen by palliative care and made DNR.  During recent admission his advanced directives were reversed to full code.  Atrial fibrillation-heart rate today 60 bpm.  Denies recent episodes of accelerated heartbeat.  Denies bleeding issues.  He reports compliance with apixaban Continue Tikosyn, Eliquis Avoid triggers Increase physical activity as tolerated  COPD-no increased DOE.  Oxygen dependent.  Presents today on 3 L of O2. Follows with pulmonology  Hypokalemia-at discharge potassium was 3.6 and on follow-up was 4.9.  (12/23/2020)  Follows with PCP  Disposition: Follow-up with Dr. Claiborne Billings or APP in 3-4 weeks  Jossie Ng. Domonic Kimball NP-C    12/29/2020, 8:52 AM [address] Office [phone] Fax [phone]  Notice: This dictation was prepared with Dragon dictation along with smaller phrase technology. Any transcriptional errors that result from this process are unintentional and may not be corrected upon review.  I spent 14 minutes examining this patient, reviewing medications, and using patient centered shared decision making involving her cardiac care.  Prior to her visit I spent greater than 20 minutes reviewing her past medical history,  medications, and prior cardiac tests.

## 2020-12-29 ENCOUNTER — Ambulatory Visit: Payer: Medicare PPO | Admitting: Physician Assistant

## 2020-12-29 ENCOUNTER — Other Ambulatory Visit: Payer: Self-pay

## 2020-12-29 ENCOUNTER — Ambulatory Visit (INDEPENDENT_AMBULATORY_CARE_PROVIDER_SITE_OTHER): Payer: No Typology Code available for payment source | Admitting: General Practice

## 2020-12-29 ENCOUNTER — Encounter: Payer: Self-pay | Admitting: General Practice

## 2020-12-29 VITALS — BP 131/63 | HR 60 | Ht 68.0 in | Wt 170.0 lb

## 2020-12-29 DIAGNOSIS — Z79899 Other long term (current) drug therapy: Secondary | ICD-10-CM

## 2020-12-29 DIAGNOSIS — I421 Obstructive hypertrophic cardiomyopathy: Secondary | ICD-10-CM | POA: Diagnosis not present

## 2020-12-29 DIAGNOSIS — I4819 Other persistent atrial fibrillation: Secondary | ICD-10-CM | POA: Diagnosis not present

## 2020-12-29 DIAGNOSIS — E7800 Pure hypercholesterolemia, unspecified: Secondary | ICD-10-CM

## 2020-12-29 DIAGNOSIS — J449 Chronic obstructive pulmonary disease, unspecified: Secondary | ICD-10-CM

## 2020-12-29 DIAGNOSIS — I5032 Chronic diastolic (congestive) heart failure: Secondary | ICD-10-CM | POA: Diagnosis not present

## 2020-12-29 DIAGNOSIS — I050 Rheumatic mitral stenosis: Secondary | ICD-10-CM

## 2020-12-29 MED ORDER — LOSARTAN POTASSIUM 25 MG PO TABS: 25.0000 mg | ORAL_TABLET | Freq: Every day | ORAL | 0 refills | Status: DC

## 2020-12-29 MED ORDER — DOFETILIDE 250 MCG PO CAPS: 250.0000 ug | ORAL_CAPSULE | Freq: Two times a day (BID) | ORAL | 1 refills | Status: AC

## 2020-12-29 NOTE — Patient Instructions (Signed)
Medication Instructions:  START Losartan 25 mg daily  *If you need a refill on your cardiac medications before your next appointment, please call your pharmacy*  Lab Work: Your physician recommends that you return for lab work in 1 week:  BMET  If you have labs (blood work) drawn today and your tests are completely normal, you will receive your results only by: [facility] (if you have MyChart) OR A paper copy in the mail If you have any lab test that is abnormal or we need to change your treatment, we will call you to review the results.  Testing/Procedures: NONE ordered at this time of appointment   Follow-Up: At [facility], you and your health needs are our priority.  As part of our continuing mission to provide you with exceptional heart care, we have created designated Provider Care Teams.  These Care Teams include your primary Cardiologist (physician) and Advanced Practice Providers (APPs -  Physician Assistants and Nurse Practitioners) who all work together to provide you with the care you need, when you need it.  Your next appointment:   3-4 week(s)  The format for your next appointment:   In Person  Provider:   APP  Other Instructions Limit fluids to 48-64 ounces a day  Elevate legs when sitting for periods of time  Wear compression socks  Monitor blood pressure and weights at home daily. Bring logs to next appointment Give our office a call if you have a weight gain of 3 lbs overnight or 5 lbs in a week        Exercises to do While Sitting Exercises that you do while sitting (chair exercises) can give you many of the same benefits as full exercise. Benefits include strengthening your heart, burning calories, and keeping muscles and joints healthy. Exercise can also improve your mood and help with depression and anxiety. You may benefit from chair exercises if you are unable to do standing exercises due to: Diabetic foot  pain. Obesity. Illness. Arthritis. Recovery from surgery or injury. Breathing problems. Balance problems. Another type of disability. Before starting chair exercises, check with your health care provider or a physical therapist to find out how much exercise you can tolerate and which exercises are safe for you. If your health care provider approves: Start out slowly and build up over time. Aim to work up to about 10-20 minutes for each exercise session. Make exercise part of your daily routine. Drink water when you exercise. Do not wait until you are thirsty. Drink every 10-15 minutes. Stop exercising right away if you have pain, nausea, shortness of breath, or dizziness. If you are exercising in a wheelchair, make sure to lock the wheels. Ask your health care provider whether you can do tai chi or yoga. Many positions in these mind-body exercises can be modified to do while seated. Warm-up Before starting other exercises: Sit up as straight as you can. Have your knees bent at 90 degrees, which is the shape of the capital letter "L." Keep your feet flat on the floor. Sit at the front edge of your chair, if you can. Pull in (tighten) the muscles in your abdomen and stretch your spine and neck as straight as you can. Hold this position for a few minutes. Breathe in and out evenly. Try to concentrate on your breathing, and relax your mind. Stretching Exercise A: Arm stretch Hold your arms out straight in front of your body. Bend your hands at the wrist with your fingers  pointing up, as if signaling someone to stop. Notice the slight tension in your forearms as you hold the position. Keeping your arms out and your hands bent, rotate your hands outward as far as you can and hold this stretch. Aim to have your thumbs pointing up and your pinkie fingers pointing down. Slowly repeat arm stretches for one minute as tolerated. Exercise B: Leg stretch If you can move your legs, try to "draw" letters  on the floor with the toes of your foot. Write your name with one foot. Write your name with the toes of your other foot. Slowly repeat the movements for one minute as tolerated. Exercise C: Reach for the sky Reach your hands as far over your head as you can to stretch your spine. Move your hands and arms as if you are climbing a rope. Slowly repeat the movements for one minute as tolerated. Range of motion exercises Exercise A: Shoulder roll Let your arms hang loosely at your sides. Lift just your shoulders up toward your ears, then let them relax back down. When your shoulders feel loose, rotate your shoulders in backward and forward circles. Do shoulder rolls slowly for one minute as tolerated. Exercise B: March in place As if you are marching, pump your arms and lift your legs up and down. Lift your knees as high as you can. If you are unable to lift your knees, just pump your arms and move your ankles and feet up and down. March in place for one minute as tolerated. Exercise C: Seated jumping jacks Let your arms hang down straight. Keeping your arms straight, lift them up over your head. Aim to point your fingers to the ceiling. While you lift your arms, straighten your legs and slide your heels along the floor to your sides, as wide as you can. As you bring your arms back down to your sides, slide your legs back together. If you are unable to use your legs, just move your arms. Slowly repeat seated jumping jacks for one minute as tolerated. Strengthening exercises Exercise A: Shoulder squeeze Hold your arms straight out from your body to your sides, with your elbows bent and your fists pointed at the ceiling. Keeping your arms in the bent position, move them forward so your elbows and forearms meet in front of your face. Open your arms back out as wide as you can with your elbows still bent, until you feel your shoulder blades squeezing together. Hold for 5 seconds. Slowly repeat  the movements forward and backward for one minute as tolerated. Contact a health care provider if: You have to stop exercising due to any of the following: Pain. Nausea. Shortness of breath. Dizziness. Fatigue. You have significant pain or soreness after exercising. Get help right away if: You have chest pain. You have difficulty breathing. These symptoms may represent a serious problem that is an emergency. Do not wait to see if the symptoms will go away. Get medical help right away. Call your local emergency services (911 in the [place] Do not drive yourself to the hospital. Summary Exercises that you do while sitting (chair exercises) can strengthen your heart, burn calories, and keep muscles and joints healthy. You may benefit from chair exercises if you are unable to do standing exercises due to diabetic foot pain, obesity, recovery from surgery or injury, or other conditions. Before starting chair exercises, check with your health care provider or a physical therapist to find out how much exercise you can  tolerate and which exercises are safe for you. This information is not intended to replace advice given to you by your health care provider. Make sure you discuss any questions you have with your health care provider. Document Revised: 04/10/2020 Document Reviewed: 04/10/2020 Elsevier Patient Education  2022 [employer].

## 2020-12-30 ENCOUNTER — Telehealth: Payer: Self-pay | Admitting: General Practice

## 2020-12-30 NOTE — Telephone Encounter (Signed)
Returned call to patients daughter (okay per DPR), advised her that I would forward message to Coletta Memos, NP to advise on what strength compression stockings are needed. Patients daughter verbalized understanding.

## 2020-12-30 NOTE — Telephone Encounter (Signed)
Patient daughter calling to find out what type of compression socks the patient needs. She says she is not sure what kind or what level.

## 2021-01-02 NOTE — Telephone Encounter (Signed)
Mychart message sent.

## 2021-01-04 ENCOUNTER — Telehealth: Payer: Self-pay | Admitting: Family Medicine

## 2021-01-04 NOTE — Telephone Encounter (Signed)
Pt had a four pound weight gain from yesterday to today. Home Health nurse said that he was not experiencing any issues and he was feeling fine today but he weighs himself everyday and today they noticed the weight difference. Please call back with any questions.

## 2021-01-04 NOTE — Telephone Encounter (Signed)
Have him take an extra furosemide every day for the next 2 days, and continue to monitor the weight

## 2021-01-05 ENCOUNTER — Telehealth: Payer: Self-pay

## 2021-01-05 DIAGNOSIS — I5032 Chronic diastolic (congestive) heart failure: Secondary | ICD-10-CM

## 2021-01-05 DIAGNOSIS — Z79899 Other long term (current) drug therapy: Secondary | ICD-10-CM

## 2021-01-05 LAB — BASIC METABOLIC PANEL
BUN/Creatinine Ratio: 29 — ABNORMAL HIGH (ref 10–24)
BUN: 28 mg/dL — ABNORMAL HIGH (ref 8–27)
CO2: 43 mmol/L (ref 20–29)
Calcium: 8.5 mg/dL — ABNORMAL LOW (ref 8.6–10.2)
Chloride: 91 mmol/L — ABNORMAL LOW (ref 96–106)
Creatinine, Ser: 0.95 mg/dL (ref 0.76–1.27)
Glucose: 88 mg/dL (ref 70–99)
Potassium: 5.4 mmol/L — ABNORMAL HIGH (ref 3.5–5.2)
Sodium: 141 mmol/L (ref 134–144)
eGFR: 83 mL/min/{1.73_m2} (ref >59)

## 2021-01-05 NOTE — Telephone Encounter (Signed)
Left message for home health nurse to let her know instructions for patient and also spoke with patient and advised him to take extra Furosemide for the next two days and monitor his weight and let us know what it is.  Patient voiced understanding.

## 2021-01-05 NOTE — Telephone Encounter (Signed)
Called patient's daughter- POA after patient came in for blood work and mentioned he had weight gain of 4lbs in 1 day.  She states they are giving him 80 mg lasix daily- on the chart it states as needed, but she states they have been unable to get the swelling down so he has been doing it daily, with no improvement. I asked about diet and she stated that she found he has been eating salt behind her back. I did advise this could be the cause of the swelling not improving on the lasix. Advised of reducing and stopping salt- taking medications to get fluid off, and use compression stockings as instructed.   Patient daughter verbalized understanding. She will cut down on his diet and make sure he eats right. I will route to NP that recently saw patient as FYI.  Thanks!

## 2021-01-06 ENCOUNTER — Other Ambulatory Visit: Payer: Self-pay

## 2021-01-06 ENCOUNTER — Encounter: Payer: Self-pay | Admitting: Family Medicine

## 2021-01-06 ENCOUNTER — Ambulatory Visit (INDEPENDENT_AMBULATORY_CARE_PROVIDER_SITE_OTHER): Payer: Medicare PPO | Admitting: Family Medicine

## 2021-01-06 VITALS — BP 98/49 | HR 75 | Ht 68.0 in | Wt 174.0 lb

## 2021-01-06 DIAGNOSIS — G301 Alzheimer's disease with late onset: Secondary | ICD-10-CM | POA: Diagnosis not present

## 2021-01-06 DIAGNOSIS — F02B0 Dementia in other diseases classified elsewhere, moderate, without behavioral disturbance, psychotic disturbance, mood disturbance, and anxiety: Secondary | ICD-10-CM

## 2021-01-06 DIAGNOSIS — G308 Other Alzheimer's disease: Secondary | ICD-10-CM | POA: Diagnosis not present

## 2021-01-06 MED ORDER — FUROSEMIDE 40 MG PO TABS: 40.0000 mg | ORAL_TABLET | Freq: Two times a day (BID) | ORAL | 3 refills | Status: DC

## 2021-01-06 NOTE — Telephone Encounter (Signed)
Per Deberah Pelton, NP Yesterday 01-05-21 1:25 PM Waylan Rocher, LPN: Patient's daughter contacted [place] and informed that he has had another 4 pound weight increase.  We will increase his furosemide to 160 mg x 3 days then resume his normal 80 mg daily.  We will continue with our original plan of BMP in 1 week.  Thank you.   Returned call to pt's daughter Engineer, mining ([place]) she states that pt was recently diagnosed with alzheimer's and she would like to have [place] call her with all results because of this. do not call pt...he will forget and not do any directions.  Pt's daughter informed of providers result & recommendations. Verbalized understanding. She will stop losartan, increase lasix x3days then back to 80mg  daily. Daughter that pt has home health nurse comes twice a week and she will let her know vitals and weight daily. She will try to call with wt gain >3#/daily -or- >5#/weekly. She will try to have this "set up" for pt and see how she will arrange for the future. She will call if she needs anything from [place]. She will call with any problems/issues

## 2021-01-06 NOTE — Progress Notes (Signed)
BP (!) 98/49   Pulse 75   Ht 5\' 8"  (1.727 m)   Wt 174 lb (78.9 kg)   SpO2 93%   BMI 26.46 kg/m    Subjective:   Patient ID: Joshua [name], male    DOB: Aug 22, 1945, 75 y.o.   MRN: [id number]  HPI: Joshua [name] is a 75 y.o. male presenting on 01/06/2021 for Medical Management of Chronic Issues and Memory Loss   HPI Memory loss Patient is coming in with a friend of the family who says that he has been having a lot of memory loss and issues and they want him tested for it today.  They said it is gradually worsening and he is breathing and everything else is worsening as well.  He recently saw cardiology and they increased his diuretics.  They said it comes and goes and ebbs and flows but is gradually been coming on over this past year. MMSE - Mini Mental State Exam 01/06/2021 01/14/2018 01/08/2017  Orientation to time 3 5 5   Orientation to Place 5 5 5   Registration 3 3 3   Attention/ Calculation 0 5 4  Recall 0 3 3  Language- name 2 objects 2 2 2   Language- repeat 1 1 1   Language- follow 3 step command 3 3 3   Language- read & follow direction 0 1 1  Write a sentence 1 1 1   Copy design 1 1 1   Total score 19 30 29      Relevant past medical, surgical, family and social history reviewed and updated as indicated. Interim medical history since our last visit reviewed. Allergies and medications reviewed and updated.  Review of Systems  Constitutional:  Negative for chills and fever.  Eyes:  Negative for visual disturbance.  Respiratory:  Negative for shortness of breath and wheezing.   Cardiovascular:  Negative for chest pain and leg swelling.  Skin:  Negative for rash.  Psychiatric/Behavioral:  Positive for confusion and decreased concentration.   All other systems reviewed and are negative.  Per HPI unless specifically indicated above   Allergies as of 01/06/2021   No Known Allergies      Medication List        Accurate as of January 06, 2021  3:43 PM. If you have  any questions, ask your nurse or doctor.          STOP taking these medications    losartan 25 MG tablet Commonly known as: COZAAR Stopped by: Waylan Rocher, LPN       TAKE these medications    apixaban 5 MG Tabs tablet Commonly known as: ELIQUIS Take 1 tablet (5 mg total) by mouth 2 (two) times daily.   cholecalciferol 1000 units tablet Commonly known as: VITAMIN D Take 1,000 Units by mouth daily.   cyanocobalamin 1000 MCG tablet Commonly known as: CVS VITAMIN B12 Take 1 tablet (1,000 mcg total) by mouth daily.   dofetilide 250 MCG capsule Commonly known as: TIKOSYN Take 1 capsule (250 mcg total) by mouth 2 (two) times daily.   furosemide 40 MG tablet Commonly known as: LASIX Take 1 tablet (40 mg total) by mouth 2 (two) times daily. What changed: Another medication with the same name was removed. Continue taking this medication, and follow the directions you see here. Changed by: [name] Ervin Hensley, MD   polyethylene glycol 17 g packet Commonly known as: MIRALAX / GLYCOLAX Take 17 g by mouth daily as needed for mild constipation.   potassium chloride SA  20 MEQ tablet Commonly known as: KLOR-CON Take 20 mEq by mouth daily as needed (when taking Furosemide).   rosuvastatin 10 MG tablet Commonly known as: CRESTOR Take 1 tablet (10 mg total) by mouth daily. What changed: when to take this   spironolactone 25 MG tablet Commonly known as: ALDACTONE Take 0.5 tablets (12.5 mg total) by mouth daily.   tamsulosin 0.4 MG Caps capsule Commonly known as: FLOMAX Take 1 capsule (0.4 mg total) by mouth daily.         Objective:   BP (!) 98/49   Pulse 75   Ht 5\' 8"  (1.727 m)   Wt 174 lb (78.9 kg)   SpO2 93%   BMI 26.46 kg/m   Wt Readings from Last 3 Encounters:  01/06/21 174 lb (78.9 kg)  12/29/20 170 lb (77.1 kg)  12/23/20 165 lb (74.8 kg)    Physical Exam Vitals and nursing note reviewed.  Constitutional:      Appearance: Normal appearance.   Neurological:     Mental Status: He is alert.  Psychiatric:        Cognition and Memory: Memory is impaired. He exhibits impaired recent memory.      Assessment & Plan:   Problem List Items Addressed This Visit   None Visit Diagnoses     Moderate late onset Alzheimer's dementia without behavioral disturbance, psychotic disturbance, mood disturbance, or anxiety ([place])    -  Primary       Offered to start Aricept or Namenda but he says he is already taking too many medicines and does not want to take them.  We discussed other options but at this point they just want to know over the memory testing and get a letter to help so that his granddaughter could help with his finances because he has had some issues with his finances recently. Follow up plan: Return if symptoms worsen or fail to improve.  Counseling provided for all of the vaccine components No orders of the defined types were placed in this encounter.   Caryl Pina, MD [facility] 01/06/2021, 3:43 PM

## 2021-01-09 DIAGNOSIS — I4891 Unspecified atrial fibrillation: Secondary | ICD-10-CM | POA: Diagnosis not present

## 2021-01-09 DIAGNOSIS — Z9981 Dependence on supplemental oxygen: Secondary | ICD-10-CM | POA: Diagnosis not present

## 2021-01-09 DIAGNOSIS — E261 Secondary hyperaldosteronism: Secondary | ICD-10-CM | POA: Diagnosis not present

## 2021-01-09 DIAGNOSIS — Z87891 Personal history of nicotine dependence: Secondary | ICD-10-CM | POA: Diagnosis not present

## 2021-01-09 DIAGNOSIS — J9611 Chronic respiratory failure with hypoxia: Secondary | ICD-10-CM | POA: Diagnosis not present

## 2021-01-09 DIAGNOSIS — I509 Heart failure, unspecified: Secondary | ICD-10-CM | POA: Diagnosis not present

## 2021-01-09 DIAGNOSIS — I4892 Unspecified atrial flutter: Secondary | ICD-10-CM | POA: Diagnosis not present

## 2021-01-09 DIAGNOSIS — Z7722 Contact with and (suspected) exposure to environmental tobacco smoke (acute) (chronic): Secondary | ICD-10-CM | POA: Diagnosis not present

## 2021-01-09 DIAGNOSIS — Z7901 Long term (current) use of anticoagulants: Secondary | ICD-10-CM | POA: Diagnosis not present

## 2021-01-12 ENCOUNTER — Other Ambulatory Visit: Payer: Self-pay

## 2021-01-12 ENCOUNTER — Other Ambulatory Visit: Payer: Medicare PPO

## 2021-01-12 DIAGNOSIS — I5032 Chronic diastolic (congestive) heart failure: Secondary | ICD-10-CM | POA: Diagnosis not present

## 2021-01-12 DIAGNOSIS — E785 Hyperlipidemia, unspecified: Secondary | ICD-10-CM | POA: Diagnosis not present

## 2021-01-12 DIAGNOSIS — Z79899 Other long term (current) drug therapy: Secondary | ICD-10-CM | POA: Diagnosis not present

## 2021-01-13 LAB — BASIC METABOLIC PANEL
BUN/Creatinine Ratio: 24 (ref 10–24)
BUN: 30 mg/dL — ABNORMAL HIGH (ref 8–27)
CO2: 37 mmol/L — ABNORMAL HIGH (ref 20–29)
Calcium: 8.9 mg/dL (ref 8.6–10.2)
Chloride: 95 mmol/L — ABNORMAL LOW (ref 96–106)
Creatinine, Ser: 1.23 mg/dL (ref 0.76–1.27)
Glucose: 118 mg/dL — ABNORMAL HIGH (ref 70–99)
Potassium: 4.7 mmol/L (ref 3.5–5.2)
Sodium: 144 mmol/L (ref 134–144)
eGFR: 61 mL/min/{1.73_m2} (ref >59)

## 2021-01-13 LAB — LIPID PANEL
Chol/HDL Ratio: 2.2 ratio (ref 0.0–5.0)
Cholesterol, Total: 151 mg/dL (ref 100–199)
HDL: 70 mg/dL (ref >39)
LDL Chol Calc (NIH): 64 mg/dL (ref 0–99)
Triglycerides: 92 mg/dL (ref 0–149)
VLDL Cholesterol Cal: 17 mg/dL (ref 5–40)

## 2021-01-24 ENCOUNTER — Telehealth: Payer: Self-pay | Admitting: Family Medicine

## 2021-01-25 ENCOUNTER — Emergency Department (HOSPITAL_COMMUNITY): Payer: No Typology Code available for payment source

## 2021-01-25 ENCOUNTER — Encounter: Payer: Self-pay | Admitting: Family Medicine

## 2021-01-25 ENCOUNTER — Inpatient Hospital Stay (HOSPITAL_COMMUNITY)
Admission: EM | Admit: 2021-01-25 | Discharge: 2021-01-31 | DRG: 291 | Disposition: A | Discharge status: Home Health | Payer: No Typology Code available for payment source | Attending: Family Medicine | Admitting: Family Medicine

## 2021-01-25 ENCOUNTER — Ambulatory Visit (INDEPENDENT_AMBULATORY_CARE_PROVIDER_SITE_OTHER): Payer: Medicare PPO | Admitting: Family Medicine

## 2021-01-25 ENCOUNTER — Other Ambulatory Visit: Payer: Self-pay

## 2021-01-25 VITALS — BP 121/56 | HR 75 | Temp 97.5°F | Ht 68.0 in | Wt 180.0 lb

## 2021-01-25 DIAGNOSIS — Z7901 Long term (current) use of anticoagulants: Secondary | ICD-10-CM

## 2021-01-25 DIAGNOSIS — E441 Mild protein-calorie malnutrition: Secondary | ICD-10-CM | POA: Diagnosis present

## 2021-01-25 DIAGNOSIS — Z87891 Personal history of nicotine dependence: Secondary | ICD-10-CM

## 2021-01-25 DIAGNOSIS — Z79899 Other long term (current) drug therapy: Secondary | ICD-10-CM

## 2021-01-25 DIAGNOSIS — Z9981 Dependence on supplemental oxygen: Secondary | ICD-10-CM

## 2021-01-25 DIAGNOSIS — D509 Iron deficiency anemia, unspecified: Secondary | ICD-10-CM | POA: Diagnosis present

## 2021-01-25 DIAGNOSIS — D539 Nutritional anemia, unspecified: Secondary | ICD-10-CM | POA: Diagnosis present

## 2021-01-25 DIAGNOSIS — Z825 Family history of asthma and other chronic lower respiratory diseases: Secondary | ICD-10-CM

## 2021-01-25 DIAGNOSIS — J9622 Acute and chronic respiratory failure with hypercapnia: Secondary | ICD-10-CM | POA: Diagnosis present

## 2021-01-25 DIAGNOSIS — R0682 Tachypnea, not elsewhere classified: Secondary | ICD-10-CM

## 2021-01-25 DIAGNOSIS — Z6827 Body mass index (BMI) 27.0-27.9, adult: Secondary | ICD-10-CM

## 2021-01-25 DIAGNOSIS — I509 Heart failure, unspecified: Secondary | ICD-10-CM | POA: Insufficient documentation

## 2021-01-25 DIAGNOSIS — Z8249 Family history of ischemic heart disease and other diseases of the circulatory system: Secondary | ICD-10-CM

## 2021-01-25 DIAGNOSIS — Z823 Family history of stroke: Secondary | ICD-10-CM

## 2021-01-25 DIAGNOSIS — J9621 Acute and chronic respiratory failure with hypoxia: Secondary | ICD-10-CM | POA: Diagnosis present

## 2021-01-25 DIAGNOSIS — J441 Chronic obstructive pulmonary disease with (acute) exacerbation: Secondary | ICD-10-CM | POA: Diagnosis present

## 2021-01-25 DIAGNOSIS — I5033 Acute on chronic diastolic (congestive) heart failure: Secondary | ICD-10-CM | POA: Diagnosis present

## 2021-01-25 DIAGNOSIS — Z20822 Contact with and (suspected) exposure to covid-19: Secondary | ICD-10-CM | POA: Diagnosis present

## 2021-01-25 DIAGNOSIS — I422 Other hypertrophic cardiomyopathy: Secondary | ICD-10-CM | POA: Diagnosis present

## 2021-01-25 DIAGNOSIS — E8729 Other acidosis: Secondary | ICD-10-CM | POA: Diagnosis present

## 2021-01-25 DIAGNOSIS — I2510 Atherosclerotic heart disease of native coronary artery without angina pectoris: Secondary | ICD-10-CM | POA: Diagnosis present

## 2021-01-25 DIAGNOSIS — Z86010 Personal history of colonic polyps: Secondary | ICD-10-CM | POA: Diagnosis not present

## 2021-01-25 DIAGNOSIS — Z801 Family history of malignant neoplasm of trachea, bronchus and lung: Secondary | ICD-10-CM | POA: Diagnosis not present

## 2021-01-25 DIAGNOSIS — Z5329 Procedure and treatment not carried out because of patient's decision for other reasons: Secondary | ICD-10-CM | POA: Diagnosis present

## 2021-01-25 DIAGNOSIS — I4819 Other persistent atrial fibrillation: Secondary | ICD-10-CM | POA: Diagnosis present

## 2021-01-25 DIAGNOSIS — Z800 Family history of malignant neoplasm of digestive organs: Secondary | ICD-10-CM

## 2021-01-25 DIAGNOSIS — I5031 Acute diastolic (congestive) heart failure: Secondary | ICD-10-CM

## 2021-01-25 DIAGNOSIS — I4510 Unspecified right bundle-branch block: Secondary | ICD-10-CM | POA: Diagnosis present

## 2021-01-25 DIAGNOSIS — J449 Chronic obstructive pulmonary disease, unspecified: Secondary | ICD-10-CM | POA: Diagnosis present

## 2021-01-25 DIAGNOSIS — I482 Chronic atrial fibrillation, unspecified: Secondary | ICD-10-CM | POA: Diagnosis not present

## 2021-01-25 DIAGNOSIS — E8809 Other disorders of plasma-protein metabolism, not elsewhere classified: Secondary | ICD-10-CM | POA: Diagnosis present

## 2021-01-25 DIAGNOSIS — I110 Hypertensive heart disease with heart failure: Principal | ICD-10-CM | POA: Diagnosis present

## 2021-01-25 DIAGNOSIS — R0902 Hypoxemia: Secondary | ICD-10-CM

## 2021-01-25 DIAGNOSIS — R7989 Other specified abnormal findings of blood chemistry: Secondary | ICD-10-CM | POA: Diagnosis present

## 2021-01-25 DIAGNOSIS — I272 Pulmonary hypertension, unspecified: Secondary | ICD-10-CM | POA: Diagnosis present

## 2021-01-25 DIAGNOSIS — Z83438 Family history of other disorder of lipoprotein metabolism and other lipidemia: Secondary | ICD-10-CM

## 2021-01-25 DIAGNOSIS — R0689 Other abnormalities of breathing: Secondary | ICD-10-CM

## 2021-01-25 DIAGNOSIS — R0600 Dyspnea, unspecified: Secondary | ICD-10-CM

## 2021-01-25 DIAGNOSIS — R609 Edema, unspecified: Secondary | ICD-10-CM | POA: Diagnosis not present

## 2021-01-25 DIAGNOSIS — Z955 Presence of coronary angioplasty implant and graft: Secondary | ICD-10-CM

## 2021-01-25 DIAGNOSIS — J42 Unspecified chronic bronchitis: Secondary | ICD-10-CM

## 2021-01-25 LAB — CBC WITH DIFFERENTIAL/PLATELET
Abs Immature Granulocytes: 0.02 10*3/uL (ref 0.00–0.07)
Basophils Absolute: 0 10*3/uL (ref 0.0–0.1)
Basophils Relative: 0 %
Eosinophils Absolute: 0 10*3/uL (ref 0.0–0.5)
Eosinophils Relative: 0 %
HCT: 35.7 % — ABNORMAL LOW (ref 39.0–52.0)
Hemoglobin: 10 g/dL — ABNORMAL LOW (ref 13.0–17.0)
Immature Granulocytes: 0 %
Lymphocytes Relative: 10 %
Lymphs Abs: 0.6 10*3/uL — ABNORMAL LOW (ref 0.7–4.0)
MCH: 30.1 pg (ref 26.0–34.0)
MCHC: 28 g/dL — ABNORMAL LOW (ref 30.0–36.0)
MCV: 107.5 fL — ABNORMAL HIGH (ref 80.0–100.0)
Monocytes Absolute: 0.5 10*3/uL (ref 0.1–1.0)
Monocytes Relative: 9 %
Neutro Abs: 4.5 10*3/uL (ref 1.7–7.7)
Neutrophils Relative %: 81 %
Platelets: 126 10*3/uL — ABNORMAL LOW (ref 150–400)
RBC: 3.32 MIL/uL — ABNORMAL LOW (ref 4.22–5.81)
RDW: 15.4 % (ref 11.5–15.5)
WBC: 5.7 10*3/uL (ref 4.0–10.5)
nRBC: 0 % (ref 0.0–0.2)

## 2021-01-25 LAB — HEPATIC FUNCTION PANEL
ALT: 14 U/L (ref 0–44)
AST: 23 U/L (ref 15–41)
Albumin: 3.1 g/dL — ABNORMAL LOW (ref 3.5–5.0)
Alkaline Phosphatase: 55 U/L (ref 38–126)
Bilirubin, Direct: 0.6 mg/dL — ABNORMAL HIGH (ref 0.0–0.2)
Indirect Bilirubin: 1.6 mg/dL — ABNORMAL HIGH (ref 0.3–0.9)
Total Bilirubin: 2.2 mg/dL — ABNORMAL HIGH (ref 0.3–1.2)
Total Protein: 6.5 g/dL (ref 6.5–8.1)

## 2021-01-25 LAB — RESP PANEL BY RT-PCR (FLU A&B, COVID) ARPGX2
Influenza A by PCR: NEGATIVE
Influenza B by PCR: NEGATIVE
SARS Coronavirus 2 by RT PCR: NEGATIVE

## 2021-01-25 LAB — BASIC METABOLIC PANEL
Anion gap: 8 (ref 5–15)
BUN: 44 mg/dL — ABNORMAL HIGH (ref 8–23)
CO2: 41 mmol/L — ABNORMAL HIGH (ref 22–32)
Calcium: 8.8 mg/dL — ABNORMAL LOW (ref 8.9–10.3)
Chloride: 92 mmol/L — ABNORMAL LOW (ref 98–111)
Creatinine, Ser: 1.13 mg/dL (ref 0.61–1.24)
GFR, Estimated: >60 mL/min (ref >60)
Glucose, Bld: 94 mg/dL (ref 70–99)
Potassium: 4.4 mmol/L (ref 3.5–5.1)
Sodium: 141 mmol/L (ref 135–145)

## 2021-01-25 LAB — BRAIN NATRIURETIC PEPTIDE: B Natriuretic Peptide: 1059 pg/mL — ABNORMAL HIGH (ref 0.0–100.0)

## 2021-01-25 LAB — D-DIMER, QUANTITATIVE: D-Dimer, Quant: 3.55 ug/mL-FEU — ABNORMAL HIGH (ref 0.00–0.50)

## 2021-01-25 MED ORDER — FUROSEMIDE 10 MG/ML IJ SOLN
80.0000 mg | Freq: Once | INTRAMUSCULAR | Status: AC
  Administered 2021-01-25: 80 mg via INTRAVENOUS
  Filled 2021-01-25: qty 8

## 2021-01-25 MED ORDER — FUROSEMIDE 10 MG/ML IJ SOLN
40.0000 mg | Freq: Two times a day (BID) | INTRAMUSCULAR | Status: DC
  Administered 2021-01-25 – 2021-01-28 (×6): 40 mg via INTRAVENOUS
  Filled 2021-01-25 (×6): qty 4

## 2021-01-25 MED ORDER — ENOXAPARIN SODIUM 80 MG/0.8ML IJ SOSY
1.0000 mg/kg | PREFILLED_SYRINGE | Freq: Once | INTRAMUSCULAR | Status: AC
  Administered 2021-01-25: 80 mg via SUBCUTANEOUS
  Filled 2021-01-25: qty 0.8

## 2021-01-25 NOTE — ED Notes (Signed)
O2 reduced to 5L from 6L to begin titrating towards baseline 3 L

## 2021-01-25 NOTE — Progress Notes (Signed)
Subjective:  Patient ID: Joshua [name], male    DOB: October 08, 1945, 75 y.o.   MRN: [id number]  Patient Care Team: [name], [name], MD as PCP - General (Family Medicine) Troy Sine, MD as PCP - Cardiology (Cardiology) Thompson Grayer, MD as PCP - Electrophysiology (Cardiology) Ladene Artist, MD as Consulting Physician (Gastroenterology) Steffanie Rainwater, DPM as Consulting Physician (Podiatry) Willette Pa, Ronalee Red, PA-C as Physician Assistant (Dermatology)   Chief Complaint:  Edema   HPI: Joshua [name] is a 75 y.o. male presenting on 01/25/2021 for Edema   Pt presents today with complaints of increased fluid retention/swelling, shortness of breath, pallor, weakness, and fatigue. He has acute on chronic heart failure, HOCM, CAD, valvular disease, COPD, and PAF. Pt and family report he has been declining significantly over the last 2 weeks, worsened over the last 12 hours. He is on oxygen at all times, only sating 71% on 3L via [place]. O2 increased to 4L. States his daughter does sit with him in the evenings and has not been giving him his second dose of furosemide in the evening. His weight has been increasing over the last 2 weeks, 10 lb difference from 2 weeks ago.     Relevant past medical, surgical, family, and social history reviewed and updated as indicated.  Allergies and medications reviewed and updated. Data reviewed: Chart in Epic.   Past Medical History:  Diagnosis Date   Acute lower GI bleeding    related to gastritis / viral infection   Cataract    Cirrhosis ([place])    Congestive heart failure (CHF) ([place]) 11/21/2020   COPD (chronic obstructive pulmonary disease) ([place])    PFT 05-05-2010, FEV1 .9 (26%) ratio 60   Coronary artery disease    Gastritis 06-10-2006   Hiatal hernia 06-10-2006   EGD   HOH (hard of hearing)    Hx of adenomatous colonic polyps 2005, 2013   Colonoscopy   Hypercholesteremia    Hypertension    Hypertr obst cardiomyop    mild subaortic stenosis    Iron deficiency anemia, unspecified    LVH (left ventricular hypertrophy)    Other B-complex deficiencies    Persistent atrial fibrillation (HCC)    persistent   Rheumatic fever    Thrombocytopenia ([place]) 05/2008    Past Surgical History:  Procedure Laterality Date   ATRIAL FLUTTER ABLATION  06/2010   CTI ablation performed by [name]   CARDIAC CATHETERIZATION  05/28/2005   Widely patent coronaries and normal LV function.   CARDIAC CATHETERIZATION  08/11/2001   80% LAD stenosis successfully stented with a 3.5x51mm Cypher DES postdilated to 3.55mm resulting in reduction of 80% to 0%.   CARDIAC CATHETERIZATION  01/15/2001   Focal 95% proximal RCA stenosis, stented with a 3x51mm Zeta multilink stent with postdilatation utilizing a 3.5x51mm Quantum balloon with stenosis being reduced to 0%.   CARDIOVASCULAR STRESS TEST  05/29/2011   No scintigraphic evidence of inducible myocardial ischemia. No ECG changes. EKG negative for ischemia.   CARDIOVERSION N/A 10/20/2019   Procedure: CARDIOVERSION;  Surgeon: Sanda Klein, MD;  Location: [facility] ENDOSCOPY;  Service: Cardiovascular;  Laterality: N/A;   CARDIOVERSION N/A 01/14/2020   Procedure: CARDIOVERSION;  Surgeon: Buford Dresser, MD;  Location[facility] END[facility]COPY;  Service: Cardiovascular;  Laterality: N/A;   CORONARY ANGIOPLASTY     2002-2003   SHOULDER SURGERY     TRANSTHORACIC ECHOCARDIOGRAM  05/29/2011   EF >70%, severe concentric LV hypertrophy, severe mitral annular calcification, mild-moderate aortic  regurg,     Social History   Socioeconomic History   Marital status: Widowed    Spouse name: Not on file   Number of children: 5   Years of education: Not on file   Highest education level: Not on file  Occupational History   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  Tobacco Use   Smoking status: Former    Packs/day: 1.00    Years: 10.00    Pack years: 10.00    Types: Cigarettes    Quit date: 02/26/1985    Years  since quitting: 35.9   Smokeless tobacco: Never  Vaping Use   Vaping Use: Never used  Substance and Sexual Activity   Alcohol use: No   Drug use: No   Sexual activity: Not on file  Other Topics Concern   Not on file  Social History Narrative   Daughter lives with him   Social Determinants of Health   Financial Resource Strain: Low Risk    Difficulty of Paying Living Expenses: Not hard at all  Food Insecurity: No Food Insecurity   Worried About Charity fundraiser in the Last Year: Never true   Arboriculturist in the Last Year: Never true  Transportation Needs: No Transportation Needs   Lack of Transportation (Medical): No   Lack of Transportation (Non-Medical): No  Physical Activity: Sufficiently Active   Days of Exercise per Week: 7 days   Minutes of Exercise per Session: 30 min  Stress: No Stress Concern Present   Feeling of Stress : Not at all  Social Connections: Moderately Integrated   Frequency of Communication with Friends and Family: More than three times a week   Frequency of Social Gatherings with Friends and Family: More than three times a week   Attends Religious Services: More than 4 times per year   Active Member of [employer] or Organizations: Yes   Attends Archivist Meetings: More than 4 times per year   Marital Status: Widowed  Intimate Partner Violence: Not At Risk   Fear of Current or Ex-Partner: No   Emotionally Abused: No   Physically Abused: No   Sexually Abused: No    Outpatient Encounter Medications as of 01/25/2021  Medication Sig   apixaban (ELIQUIS) 5 MG TABS tablet Take 1 tablet (5 mg total) by mouth 2 (two) times daily.   cholecalciferol (VITAMIN D) 1000 units tablet Take 1,000 Units by mouth daily.   cyanocobalamin ([employer]S VITAMIN B12) 1000 MCG tablet Take 1 tablet (1,000 mcg total) by mouth daily.   dofetilide (TIKOSYN) 250 MCG capsule Take 1 capsule (250 mcg total) by mouth 2 (two) times daily.   furosemide (LASIX) 40 MG tablet Take 1  tablet (40 mg total) by mouth 2 (two) times daily.   polyethylene glycol (MIRALAX / GLYCOLAX) packet Take 17 g by mouth daily as needed for mild constipation.   potassium chloride SA (KLOR-CON) 20 MEQ tablet Take 20 mEq by mouth daily as needed (when taking Furosemide).   rosuvastatin (CRESTOR) 10 MG tablet Take 1 tablet (10 mg total) by mouth daily. (Patient taking differently: Take 10 mg by mouth at bedtime.)   spironolactone (ALDACTONE) 25 MG tablet Take 0.5 tablets (12.5 mg total) by mouth daily.   tamsulosin (FLOMAX) 0.4 MG CAPS capsule Take 1 capsule (0.4 mg total) by mouth daily.   No facility-administered encounter medications on file as of 01/25/2021.    No Known Allergies  Review of Systems  Constitutional:  Positive for activity change, appetite change, fatigue and unexpected weight change.  Respiratory:  Positive for shortness of breath.   Cardiovascular:  Positive for leg swelling.  Gastrointestinal:  Positive for abdominal distention.  Musculoskeletal:  Positive for gait problem.  Skin:  Positive for color change and pallor.  Neurological:  Positive for weakness.       Objective:  BP (!) 121/56   Pulse 75   Temp (!) 97.5 F (36.4 C)   Ht 5\' 8"  (1.727 m) Comment: wheelchair  Wt 180 lb (81.6 kg) Comment: wheelchair  SpO2 (!) 58%   BMI 27.37 kg/m    Wt Readings from Last 3 Encounters:  01/25/21 180 lb (81.6 kg)  01/06/21 174 lb (78.9 kg)  12/29/20 170 lb (77.1 kg)    Physical Exam Vitals and nursing note reviewed.  Constitutional:      General: He is in acute distress.     Appearance: Normal appearance. He is well-developed and well-groomed. He is ill-appearing. He is not toxic-appearing or diaphoretic.  HENT:     Head: Normocephalic and atraumatic.     Jaw: There is normal jaw occlusion.     Right Ear: Hearing normal.     Left Ear: Hearing normal.     Mouth/Throat:     Lips: Pink.     Mouth: Mucous membranes are moist.     Pharynx: Oropharynx is clear.  Uvula midline.  Eyes:     General: Lids are normal.     Extraocular Movements: Extraocular movements intact.     Conjunctiva/sclera: Conjunctivae normal.     Pupils: Pupils are equal, round, and reactive to light.  Neck:     Thyroid: No thyroid mass, thyromegaly or thyroid tenderness.     Vascular: No carotid bruit or JVD.     Trachea: Trachea and phonation normal.  Cardiovascular:     Rate and Rhythm: Normal rate. Rhythm irregularly irregular.     Chest Wall: PMI is not displaced.     Pulses: Normal pulses.     Heart sounds: Murmur heard.  Systolic murmur is present with a grade of 4/6.    No friction rub. Gallop present. S3 sounds present.     Comments: Edema to BLE and lower abdomen Pulmonary:     Effort: Tachypnea present.     Breath sounds: Examination of the right-lower field reveals rales. Examination of the left-lower field reveals rales. Rhonchi and rales present. No wheezing.  Abdominal:     General: Bowel sounds are normal. There is distension. There is no abdominal bruit.     Tenderness: There is no abdominal tenderness. There is no right CVA tenderness or left CVA tenderness.     Hernia: No hernia is present.  Musculoskeletal:        General: Normal range of motion.     Cervical back: Normal range of motion and neck supple.     Right lower leg: 3+ Pitting Edema present.     Left lower leg: 3+ Pitting Edema present.  Lymphadenopathy:     Cervical: No cervical adenopathy.  Skin:    General: Skin is warm and dry.     Capillary Refill: Capillary refill takes less than 2 seconds.     Coloration: Skin is pale. Skin is not cyanotic or jaundiced.     Findings: No rash.  Neurological:     General: No focal deficit present.     Mental Status: He is alert and oriented to person, place, and  time.     Sensory: Sensation is intact.     Motor: Weakness present.     Coordination: Coordination is intact.     Gait: Gait abnormal (in wheelchair).     Deep Tendon Reflexes:  Reflexes are normal and symmetric.  Psychiatric:        Attention and Perception: Attention and perception normal.        Mood and Affect: Mood and affect normal.        Speech: Speech normal.        Behavior: Behavior normal. Behavior is cooperative.        Thought Content: Thought content normal.        Cognition and Memory: Cognition and memory normal.        Judgment: Judgment normal.    Results for orders placed or performed in visit on 01/12/21  Lipid panel  Result Value Ref Range   Cholesterol, Total 151 100 - 199 mg/dL   Triglycerides 92 0 - 149 mg/dL   HDL 70 >39 mg/dL   VLDL Cholesterol Cal 17 5 - 40 mg/dL   LDL Chol Calc (NIH) 64 0 - 99 mg/dL   Chol/HDL Ratio 2.2 0.0 - 5.0 ratio       Pertinent labs & imaging results that were available during my care of the patient were reviewed by me and considered in my medical decision making.  Assessment & Plan:  Joshua [name] was seen today for edema.  Diagnoses and all orders for this visit:  Hypoxia Tachypnea Acute diastolic heart failure ([place]) Pt in acute distress. Pt  transported to the ED for evaluation and treatment by EMS.   Continue all other maintenance medications.  Follow up plan: To ED for evaluation and treatment   The above assessment and management plan was discussed with the patient. The patient verbalized understanding of and has agreed to the management plan. Patient is aware to call the clinic if they develop any new symptoms or if symptoms persist or worsen. Patient is aware when to return to the clinic for a follow-up visit. Patient educated on when it is appropriate to go to the emergency department.   Monia Pouch, FNP-C [facility] [phone]

## 2021-01-25 NOTE — H&P (Signed)
History and Physical  COBE VINEY RXV:[id number] DOB: 07/22/1945 DOA: 01/25/2021  Referring physician:  Hayden Rasmussen PCP: [name], [name], MD  Patient coming from: Home  Chief Complaint: Shortness of breath  HPI: Joshua [name] is a 75 y.o. male with medical history significant for HFpEF, CAD s/p prior PCI to the LAD and RCA, hypertension, COPD on home oxygen (3L), persistent atrial fibrillation/flutter, recent GI bleed due to internal hemorrhoids (July 2022) and on Eliquis for CVA prevention who presents to the emergency department due to several days of worsening increasing shortness of breath.  Patient states that his legs usually swell over a few months and that once he takes Lasix, the legs usually get better.  However, he has developed increased leg swelling within the last few weeks, but this worsened in the last few days with increased shortness of breath.  Patient states that he could barely walk 15 feet without being short of breath.  He denies chest pain, fever, chest congestion, cough, abdominal pain, nausea, vomiting or diarrhea.  Patient believes that he has gained about 10 pounds within last 12 weeks.  EMS was activated, O2 sat was noted to be 80% on home 3 LPM, this was increased to 5 LPM and patient was sent to the ED for further evaluation.  ED Course:  In the emergency department, BP was soft at 108/45, he was initially tachypneic.  O2 sat was 92-100% on 4 LPM.  Work-up in the ED showed microcytic anemia, BUN/creatinine 44/1.3, D-dimer 3.55, albumin 3.1, BNP 1059.  Influenza A, B and SARS coronavirus 2 was negative. Chest x-ray showed probable congestive heart failure.  Cardiomegaly, pulmonary venous hypertension, mild edema and small effusions. IV Lasix 80 mg x 1 and therapeutic Lovenox x1 was given.  Hospitalist was asked to admit patient for further evaluation and management.  Review of Systems: Constitutional: Negative for chills and fever.  HENT: Negative for ear  pain and sore throat.   Eyes: Negative for pain and visual disturbance.  Respiratory: Positive for shortness of breath.  Negative for cough, chest tightness  Cardiovascular: Positive for leg swelling.  Negative for chest pain and palpitations.  Gastrointestinal: Negative for abdominal pain and vomiting.  Endocrine: Negative for polyphagia and polyuria.  Genitourinary: Negative for decreased urine volume, dysuria, enuresis Musculoskeletal: Negative for arthralgias and back pain.  Skin: Negative for color change and rash.  Allergic/Immunologic: Negative for immunocompromised state.  Neurological: Negative for tremors, syncope, speech difficulty Hematological: Does not bruise/bleed easily.  All other systems reviewed and are negative  Past Medical History:  Diagnosis Date   Acute lower GI bleeding    related to gastritis / viral infection   Cataract    Cirrhosis ([place])    Congestive heart failure (CHF) ([place]) 11/21/2020   COPD (chronic obstructive pulmonary disease) ([place])    PFT 05-05-2010, FEV1 .9 (26%) ratio 60   Coronary artery disease    Gastritis 06-10-2006   Hiatal hernia 06-10-2006   EGD   HOH (hard of hearing)    Hx of adenomatous colonic polyps 2005, 2013   Colonoscopy   Hypercholesteremia    Hypertension    Hypertr obst cardiomyop    mild subaortic stenosis   Iron deficiency anemia, unspecified    LVH (left ventricular hypertrophy)    Other B-complex deficiencies    Persistent atrial fibrillation ([place])    persistent   Rheumatic fever    Thrombocytopenia ([place]) 05/2008   Past Surgical History:  Procedure Laterality Date  ATRIAL FLUTTER ABLATION  06/2010   CTI ablation performed by [name]   CARDIAC CATHETERIZATION  05/28/2005   Widely patent coronaries and normal LV function.   CARDIAC CATHETERIZATION  08/11/2001   80% LAD stenosis successfully stented with a 3.5x83mm Cypher DES postdilated to 3.58mm resulting in reduction of 80% to 0%.   CARDIAC CATHETERIZATION   01/15/2001   Focal 95% proximal RCA stenosis, stented with a 3x16mm Zeta multilink stent with postdilatation utilizing a 3.5x9mm Quantum balloon with stenosis being reduced to 0%.   CARDIOVASCULAR STRESS TEST  05/29/2011   No scintigraphic evidence of inducible myocardial ischemia. No ECG changes. EKG negative for ischemia.   CARDIOVERSION N/A 10/20/2019   Procedure: CARDIOVERSION;  Surgeon: Sanda Klein, MD;  Location: [facility] ENDOSCOPY;  Service: Cardiovascular;  Laterality: N/A;   CARDIOVERSION N/A 01/14/2020   Procedure: CARDIOVERSION;  Surgeon: Buford Dresser, MD;  Location: [facility] ENDOSCOPY;  Service: Cardiovascular;  Laterality: N/A;   CORONARY ANGIOPLASTY     2002-2003   SHOULDER SURGERY     TRANSTHORACIC ECHOCARDIOGRAM  05/29/2011   EF >70%, severe concentric LV hypertrophy, severe mitral annular calcification, mild-moderate aortic regurg,     Social History:  reports that he quit smoking about 35 years ago. His smoking use included cigarettes. He has a 10.00 pack-year smoking history. He has never used smokeless tobacco. He reports that he does not drink alcohol and does not use drugs.   No Known Allergies  Family History  Problem Relation Age of Onset   Heart attack Brother 41       Ventircular rupture   Emphysema Brother    Lung cancer Brother    Heart disease Mother        Enlarged heart   Stroke Mother    Hyperlipidemia Father    CAD Father    Hypertension Sister    CAD Paternal Uncle    Stroke Paternal Uncle    Cancer Maternal Grandfather        colon   Colon cancer Maternal Grandfather    Asthma Brother     Prior to Admission medications   Medication Sig Start Date End Date Taking? Authorizing Provider  apixaban (ELIQUIS) 5 MG TABS tablet Take 1 tablet (5 mg total) by mouth 2 (two) times daily. 12/10/19   Allred, Jeneen Rinks, MD  cholecalciferol (VITAMIN D) 1000 units tablet Take 1,000 Units by mouth daily.    [provider]  cyanocobalamin ([employer] VITAMIN  B12) 1000 MCG tablet Take 1 tablet (1,000 mcg total) by mouth daily. 11/24/14   Eckard, Tammy, RPH-CPP  dofetilide (TIKOSYN) 250 MCG capsule Take 1 capsule (250 mcg total) by mouth 2 (two) times daily. 12/29/20   Deberah Pelton, NP  furosemide (LASIX) 40 MG tablet Take 1 tablet (40 mg total) by mouth 2 (two) times daily. 01/06/21   [name], [name], MD  polyethylene glycol (MIRALAX / GLYCOLAX) packet Take 17 g by mouth daily as needed for mild constipation.    [provider]  potassium chloride SA (KLOR-CON) 20 MEQ tablet Take 20 mEq by mouth daily as needed (when taking Furosemide).    [provider]  rosuvastatin (CRESTOR) 10 MG tablet Take 1 tablet (10 mg total) by mouth daily. Patient taking differently: Take 10 mg by mouth at bedtime. 05/03/16   Chipper Herb, MD  spironolactone (ALDACTONE) 25 MG tablet Take 0.5 tablets (12.5 mg total) by mouth daily. 12/15/20   Cheryln Manly, NP  tamsulosin (FLOMAX) 0.4 MG CAPS capsule  Take 1 capsule (0.4 mg total) by mouth daily. 06/13/20   [name], [name], MD    Physical Exam: BP (!) 111/44 (BP Location: Left Arm)   Pulse (!) 57   Temp (!) 97.1 F (36.2 C)   Resp 19   Ht 5\' 8"  (1.727 m)   Wt 81.6 kg   SpO2 92%   BMI 27.35 kg/m   General: 75 y.o. year-old male well developed well nourished in no acute distress.  Alert and oriented x3. HEENT: NCAT, EOMI Neck: Supple, trachea medial Cardiovascular: Bradycardia.  Irregular rate and rhythm with no rubs or gallops.  No thyromegaly or JVD noted. +2 lower extremity edema. 2/4 pulses in all 4 extremities. Respiratory: Tachypnea.  Bilateral rales in lower lobes.  Abdomen: Soft, nontender nondistended with normal bowel sounds x4 quadrants. Muskuloskeletal: No cyanosis, clubbing or edema noted bilaterally Neuro: CN II-XII intact, strength 5/5 x 4, sensation, reflexes intact Skin: No ulcerative lesions noted or rashes Psychiatry: Judgement and insight appear normal. Mood is  appropriate for condition and setting          Labs on Admission:  Basic Metabolic Panel: Recent Labs  Lab 01/25/21 1535  NA 141  K 4.4  CL 92*  CO2 41*  GLUCOSE 94  BUN 44*  CREATININE 1.13  CALCIUM 8.8*   Liver Function Tests: Recent Labs  Lab 01/25/21 1500  AST 23  ALT 14  ALKPHOS 55  BILITOT 2.2*  PROT 6.5  ALBUMIN 3.1*   No results for input(s): LIPASE, AMYLASE in the last 168 hours. No results for input(s): AMMONIA in the last 168 hours. CBC: Recent Labs  Lab 01/25/21 1535  WBC 5.7  NEUTROABS 4.5  HGB 10.0*  HCT 35.7*  MCV 107.5*  PLT 126*   Cardiac Enzymes: No results for input(s): CKTOTAL, CKMB, CKMBINDEX, TROPONINI in the last 168 hours.  BNP (last 3 results) Recent Labs    11/21/20 1602 12/09/20 1418 01/25/21 1535  BNP 391.0* 1,067.4* 1,059.0*    ProBNP (last 3 results) No results for input(s): PROBNP in the last 8760 hours.  CBG: No results for input(s): GLUCAP in the last 168 hours.  Radiological Exams on Admission: DG Chest Port 1 View  Result Date: 01/25/2021 CLINICAL DATA:  Shortness of breath. Lower extremity edema. Poor oxygen saturation. EXAM: PORTABLE CHEST 1 VIEW COMPARISON:  12/10/2020 FINDINGS: Artifact overlies the chest. There is cardiomegaly and aortic atherosclerosis. There chronic calcified pleural plaques. There is pulmonary venous hypertension, possibly with mild interstitial edema. May be small pleural effusions. No acute bone finding. IMPRESSION: Probable congestive heart failure. Cardiomegaly, pulmonary venous hypertension, mild edema and small effusions. The patient also has chronic calcified pleural plaques. Electronically Signed   By: Nelson Chimes M.D.   On: 01/25/2021 15:33    EKG: I independently viewed the EKG done and my findings are as followed: A. fib with slow ventricular response with RBBB   Assessment/Plan Present on Admission:  Acute on chronic diastolic (congestive) heart failure (HCC)  CAD in native  artery  COPD (chronic obstructive pulmonary disease) ([place])  Principal Problem:   Acute on chronic diastolic (congestive) heart failure (HCC) Active Problems:   COPD (chronic obstructive pulmonary disease) (HCC)   CAD in native artery   Atrial fibrillation, chronic (HCC)   Elevated brain natriuretic peptide (BNP) level   Macrocytic anemia   Hypoalbuminemia   Elevated d-dimer   Acute on chronic respiratory failure with hypoxia (HCC)  Acute on chronic respiratory failure with hypoxia secondary  to acute on chronic diastolic CHF Elevated BNP Chest x-ray was suggestive of CHF Continue total input/output, daily weights and fluid restriction Continue IV Lasix 40 twice daily Continue Cardiac diet  Echocardiogram done on 10/05/2020 showed LVEF of 65 to 70%.  LV has no RWMA, and severely elevated pulmonary artery systolic pressure, RV systolic pressure is 63.7 mmHg.  Moderate MVR, moderate to severe MS, moderate to severe TVR.  Echocardiogram will be done in the morning  Cardiology will be consulted and we shall await further recommendation  Macrocytic anemia MCV 107.5, H/H10.0/35.7 Vit B12 and folate levels will be checked  Elevated D-dimer rule out pulmonary embolism D-dimer 3.55, therapeutic Lovenox was given VQ scan will be done in the morning to rule out PE  Hypoalbuminemia secondary to mild protein calorie malnutrition Albumin 3.1, protein supplement will be provided  Chronic atrial fibrillation EKG personally reviewed showed atrial fibrillation with slow ventricular response Patient already received therapeutic Lovenox in the ED, Eliquis will be held at this time Continue telemetry  CAD s/p stent placement Continue Crestor, therapeutic Lovenox already given in the ED, consider transitioning to Eliquis in the morning  COPD Continue DuoNebs as needed   DVT prophylaxis: Lovenox  Code Status: Full code  Family Communication: None at bedside  Disposition Plan:  Patient is  from:                        home Anticipated DC to:                   SNF or family members home Anticipated DC date:               2-3 days Anticipated DC barriers:          Patient requires inpatient management due to acute Respiratory failure due to CHF exacerbation requiring inpatient management and pending cardiology consult    Consults called: Cardiology  Admission status: Inpatient    Bernadette Hoit MD [facility] 01/26/2021, 3:55 AM

## 2021-01-25 NOTE — ED Notes (Addendum)
Now maintaining O2 at 92% on 6L [place]

## 2021-01-25 NOTE — ED Provider Notes (Signed)
[facility] EMERGENCY DEPARTMENT Provider Note   CSN: [id number] Arrival date & time: 01/25/21  1443     History Chief Complaint  Patient presents with   Shortness of Breath    Joshua [name] is a 75 y.o. male.  He is here with increased shortness of breath.  He cannot tell me how long its been going on although his lady friend told him he needed to see the doctor today.  He said he just cannot take a long breath.  He denies any cough fevers chest pain abdominal pain vomiting diarrhea.  He is normally on 3 L of oxygen.  He has chronic lower extremity edema.  He said he has been compliant with his medications.  He is up 10 pounds over the last 10 weeks.  Saturations reportedly 80% on baseline 3 L and increased to 5 L.  The history is provided by the patient.  Shortness of Breath Severity:  Moderate Onset quality:  Gradual Timing:  Constant Progression:  Worsening Chronicity:  Chronic Relieved by:  Nothing Worsened by:  Activity Ineffective treatments:  Rest Associated symptoms: no abdominal pain, no chest pain, no cough, no diaphoresis, no fever, no headaches, no hemoptysis, no rash, no sore throat, no sputum production, no vomiting and no wheezing   Risk factors: no tobacco use       Past Medical History:  Diagnosis Date   Acute lower GI bleeding    related to gastritis / viral infection   Cataract    Cirrhosis ([place])    Congestive heart failure (CHF) ([place]) 11/21/2020   COPD (chronic obstructive pulmonary disease) ([place])    PFT 05-05-2010, FEV1 .9 (26%) ratio 60   Coronary artery disease    Gastritis 06-10-2006   Hiatal hernia 06-10-2006   EGD   HOH (hard of hearing)    Hx of adenomatous colonic polyps 2005, 2013   Colonoscopy   Hypercholesteremia    Hypertension    Hypertr obst cardiomyop    mild subaortic stenosis   Iron deficiency anemia, unspecified    LVH (left ventricular hypertrophy)    Other B-complex deficiencies    Persistent atrial fibrillation (HCC)     persistent   Rheumatic fever    Thrombocytopenia ([place]) 05/2008    Patient Active Problem List   Diagnosis Date Noted   Acute on chronic diastolic CHF (congestive heart failure), NYHA class 4 ([place]) 12/09/2020   Acute on chronic combined systolic and diastolic CHF, NYHA class 4 ([place]) 12/09/2020   Acute on chronic diastolic CHF (congestive heart failure) ([place]) 12/09/2020   Mitral regurgitation 11/29/2020   Aortic stenosis 11/29/2020   Tricuspid regurgitation 11/29/2020   Congestive heart failure (CHF) ([place]) 11/21/2020   Acute on chronic diastolic (congestive) heart failure ([place]) 11/21/2020   Chronic diastolic heart failure ([place]) 10/13/2020   Chronic obstructive pulmonary disease, unspecified ([place]) 09/14/2020   Splenic infarct    Rectal bleeding 09/05/2020   Adenomatous polyps 12/13/2016   Long term (current) use of anticoagulants [Z79.01] 06/14/2016   CAD in native artery 01/17/2016   HOCM (hypertrophic obstructive cardiomyopathy) ([place]) 07/16/2015   Hepatic cirrhosis ([place])    Syncope and collapse 01/14/2015   Erectile dysfunction 11/10/2014   Thrombocytopenia ([place]) 01/30/2014   Hyperlipidemia LDL goal <70 12/03/2013   High risk medication use 05/21/2013   BPH (benign prostatic hyperplasia) 01/29/2013   Family history of colon cancer 01/29/2013   PAF (paroxysmal atrial fibrillation) ([place]) 12/17/2012   Aortic valve insufficiency 12/17/2012   Mild mitral  stenosis 12/17/2012   COPD (chronic obstructive pulmonary disease) ([place]) 05/05/2010   Hypertrophic obstructive cardiomyopathy ([place]) 03/31/2010   B12 deficiency 10/11/2008   Coronary atherosclerosis 10/11/2008   HIATAL HERNIA WITH REFLUX 10/11/2008   COLONIC POLYPS, ADENOMATOUS, HX OF 10/11/2008    Past Surgical History:  Procedure Laterality Date   ATRIAL FLUTTER ABLATION  06/2010   CTI ablation performed by [name]   CARDIAC CATHETERIZATION  05/28/2005   Widely patent coronaries and normal LV function.   CARDIAC  CATHETERIZATION  08/11/2001   80% LAD stenosis successfully stented with a 3.5x9mm Cypher DES postdilated to 3.21mm resulting in reduction of 80% to 0%.   CARDIAC CATHETERIZATION  01/15/2001   Focal 95% proximal RCA stenosis, stented with a 3x66mm Zeta multilink stent with postdilatation utilizing a 3.5x81mm Quantum balloon with stenosis being reduced to 0%.   CARDIOVASCULAR STRESS TEST  05/29/2011   No scintigraphic evidence of inducible myocardial ischemia. No ECG changes. EKG negative for ischemia.   CARDIOVERSION N/A 10/20/2019   Procedure: CARDIOVERSION;  Surgeon: Sanda Klein, MD;  Location: [facility] ENDOSCOPY;  Service: Cardiovascular;  Laterality: N/A;   CARDIOVERSION N/A 01/14/2020   Procedure: CARDIOVERSION;  Surgeon: Buford Dresser, MD;  Location: [facility] ENDOSCOPY;  Service: Cardiovascular;  Laterality: N/A;   CORONARY ANGIOPLASTY     2002-2003   SHOULDER SURGERY     TRANSTHORACIC ECHOCARDIOGRAM  05/29/2011   EF >70%, severe concentric LV hypertrophy, severe mitral annular calcification, mild-moderate aortic regurg,        Family History  Problem Relation Age of Onset   Heart attack Brother 66       Ventircular rupture   Emphysema Brother    Lung cancer Brother    Heart disease Mother        Enlarged heart   Stroke Mother    Hyperlipidemia Father    CAD Father    Hypertension Sister    CAD Paternal Uncle    Stroke Paternal Uncle    Cancer Maternal Grandfather        colon   Colon cancer Maternal Grandfather    Asthma Brother     Social History   Tobacco Use   Smoking status: Former    Packs/day: 1.00    Years: 10.00    Pack years: 10.00    Types: Cigarettes    Quit date: 02/26/1985    Years since quitting: 35.9   Smokeless tobacco: Never  Vaping Use   Vaping Use: Never used  Substance Use Topics   Alcohol use: No   Drug use: No    Home Medications Prior to Admission medications   Medication Sig Start Date End Date Taking? Authorizing Provider  apixaban  (ELIQUIS) 5 MG TABS tablet Take 1 tablet (5 mg total) by mouth 2 (two) times daily. 12/10/19   Allred, Jeneen Rinks, MD  cholecalciferol (VITAMIN D) 1000 units tablet Take 1,000 Units by mouth daily.    [provider]  cyanocobalamin ([employer] VITAMIN B12) 1000 MCG tablet Take 1 tablet (1,000 mcg total) by mouth daily. 11/24/14   Eckard, Tammy, RPH-CPP  dofetilide (TIKOSYN) 250 MCG capsule Take 1 capsule (250 mcg total) by mouth 2 (two) times daily. 12/29/20   Deberah Pelton, NP  furosemide (LASIX) 40 MG tablet Take 1 tablet (40 mg total) by mouth 2 (two) times daily. 01/06/21   [name], [name], MD  polyethylene glycol (MIRALAX / GLYCOLAX) packet Take 17 g by mouth daily as needed for mild constipation.    [provider]  potassium chloride SA (KLOR-CON) 20 MEQ tablet Take 20 mEq by mouth daily as needed (when taking Furosemide).    [provider]  rosuvastatin (CRESTOR) 10 MG tablet Take 1 tablet (10 mg total) by mouth daily. Patient taking differently: Take 10 mg by mouth at bedtime. 05/03/16   Chipper Herb, MD  spironolactone (ALDACTONE) 25 MG tablet Take 0.5 tablets (12.5 mg total) by mouth daily. 12/15/20   Cheryln Manly, NP  tamsulosin (FLOMAX) 0.4 MG CAPS capsule Take 1 capsule (0.4 mg total) by mouth daily. 06/13/20   [name], [name], MD    Allergies    Patient has no known allergies.  Review of Systems   Review of Systems  Constitutional:  Negative for diaphoresis and fever.  HENT:  Negative for sore throat.   Eyes:  Negative for visual disturbance.  Respiratory:  Positive for shortness of breath. Negative for cough, hemoptysis, sputum production and wheezing.   Cardiovascular:  Positive for leg swelling. Negative for chest pain.  Gastrointestinal:  Negative for abdominal pain and vomiting.  Genitourinary:  Negative for dysuria.  Musculoskeletal:  Negative for back pain.  Skin:  Negative for rash.  Neurological:  Negative for headaches.   Physical  Exam Updated Vital Signs BP (!) 141/80 (BP Location: Left Arm)   Pulse 69   Temp 98.8 F (37.1 C) (Oral)   Resp (!) 23   Wt 80.3 kg   SpO2 90%   BMI 26.91 kg/m   Physical Exam Vitals and nursing note reviewed.  Constitutional:      General: He is not in acute distress.    Appearance: He is well-developed.  HENT:     Head: Normocephalic and atraumatic.  Eyes:     Conjunctiva/sclera: Conjunctivae normal.  Cardiovascular:     Rate and Rhythm: Normal rate and regular rhythm.     Heart sounds: Murmur heard.  Pulmonary:     Effort: Tachypnea and accessory muscle usage present. No respiratory distress.     Breath sounds: Examination of the left-upper field reveals rhonchi. Rhonchi present.  Abdominal:     Palpations: Abdomen is soft.     Tenderness: There is no abdominal tenderness.  Musculoskeletal:        General: No swelling.     Cervical back: Neck supple.     Right lower leg: No tenderness. Edema present.     Left lower leg: No tenderness. Edema present.     Comments: He has symmetric 2+ edema bilateral lower extremities  Skin:    General: Skin is warm and dry.     Capillary Refill: Capillary refill takes less than 2 seconds.  Neurological:     General: No focal deficit present.     Mental Status: He is alert.  Psychiatric:        Mood and Affect: Mood normal.    ED Results / Procedures / Treatments   Labs (all labs ordered are listed, but only abnormal results are displayed) Labs Reviewed  BASIC METABOLIC PANEL - Abnormal; Notable for the following components:      Result Value   Chloride 92 (*)    CO2 41 (*)    BUN 44 (*)    Calcium 8.8 (*)    All other components within normal limits  BRAIN NATRIURETIC PEPTIDE - Abnormal; Notable for the following components:   B Natriuretic Peptide 1,059.0 (*)    All other components within normal limits  D-DIMER, QUANTITATIVE - Abnormal; Notable for the  following components:   D-Dimer, Quant 3.55 (*)    All other  components within normal limits  CBC WITH DIFFERENTIAL/PLATELET - Abnormal; Notable for the following components:   RBC 3.32 (*)    Hemoglobin 10.0 (*)    HCT 35.7 (*)    MCV 107.5 (*)    MCHC 28.0 (*)    Platelets 126 (*)    Lymphs Abs 0.6 (*)    All other components within normal limits  HEPATIC FUNCTION PANEL - Abnormal; Notable for the following components:   Albumin 3.1 (*)    Total Bilirubin 2.2 (*)    Bilirubin, Direct 0.6 (*)    Indirect Bilirubin 1.6 (*)    All other components within normal limits  COMPREHENSIVE METABOLIC PANEL - Abnormal; Notable for the following components:   Chloride 92 (*)    CO2 >45 (*)    BUN 39 (*)    Calcium 8.5 (*)    Total Protein 5.9 (*)    Albumin 2.8 (*)    All other components within normal limits  CBC - Abnormal; Notable for the following components:   RBC 3.13 (*)    Hemoglobin 9.7 (*)    HCT 33.6 (*)    MCV 107.3 (*)    MCHC 28.9 (*)    RDW 15.9 (*)    Platelets 125 (*)    All other components within normal limits  RESP PANEL BY RT-PCR (FLU A&B, COVID) ARPGX2  APTT  MAGNESIUM  PHOSPHORUS  VITAMIN B12  FOLATE    EKG EKG Interpretation  Date/Time:  Wednesday January 25 2021 14:54:38 EST Ventricular Rate:  67 PR Interval:    QRS Duration: 126 QT Interval:  452 QTC Calculation: 478 R Axis:   184 Text Interpretation: Atrial fibrillation Right bundle branch block Borderline ST elevation, anterior leads No significant change since prior 10/22 Confirmed by Aletta Edouard [phone]) on 01/25/2021 3:03:42 PM  Radiology DG Chest Port 1 View  Result Date: 01/25/2021 CLINICAL DATA:  Shortness of breath. Lower extremity edema. Poor oxygen saturation. EXAM: PORTABLE CHEST 1 VIEW COMPARISON:  12/10/2020 FINDINGS: Artifact overlies the chest. There is cardiomegaly and aortic atherosclerosis. There chronic calcified pleural plaques. There is pulmonary venous hypertension, possibly with mild interstitial edema. May be small pleural  effusions. No acute bone finding. IMPRESSION: Probable congestive heart failure. Cardiomegaly, pulmonary venous hypertension, mild edema and small effusions. The patient also has chronic calcified pleural plaques. Electronically Signed   By: Nelson Chimes M.D.   On: 01/25/2021 15:33    Procedures Procedures   Medications Ordered in ED Medications  furosemide (LASIX) injection 40 mg (40 mg Intravenous Given 01/25/21 2344)  acetaminophen (TYLENOL) tablet 650 mg (has no administration in time range)  feeding supplement (ENSURE ENLIVE / ENSURE PLUS) liquid 237 mL (has no administration in time range)  ipratropium-albuterol (DUONEB) 0.5-2.5 (3) MG/3ML nebulizer solution 3 mL (has no administration in time range)  furosemide (LASIX) injection 80 mg (80 mg Intravenous Given 01/25/21 1719)  enoxaparin (LOVENOX) injection 80 mg (80 mg Subcutaneous Given 01/25/21 2003)  technetium albumin aggregated (MAA) injection solution 4 millicurie (4.3 millicuries Intravenous Contrast Given 01/26/21 1040)    ED Course  I have reviewed the triage vital signs and the nursing notes.  Pertinent labs & imaging results that were available during my care of the patient were reviewed by me and considered in my medical decision making (see chart for details).  Clinical Course as of 01/26/21 1052  Wed Jan 25, 2021  1537 Chest x-ray with fluid in the fissure probable CHF.  Awaiting radiology reading. [MB]  1615 Patient D-dimer markedly elevated.  Unfortunately CT not available until at least tomorrow.  We will follow-up on rest of labs to see if transfer is necessary versus empiric management. [MB]  7741 Patient states he is feeling better.  He states he is ready to go home.  We will try to wean his oxygen and see how he is doing. [MB]  1926 Patient tried to do trending pulse ox but desatted into the 70s just getting to the side of the bed. [MB]  1955 Discussed with Dr. Josephine Cables [facility] hospitalist who will evaluate the  patient for admission.  He asked if I would order him a one-time dose of Lovenox and they will make a decision whether VQ scan tomorrow or CT if it becomes available. [MB]    Clinical Course User Index [MB] Hayden Rasmussen, MD   MDM Rules/Calculators/A&P                          Joshua [name] was evaluated in Emergency Department on 01/25/2021 for the symptoms described in the history of present illness. He was evaluated in the context of the global COVID-19 pandemic, which necessitated consideration that the patient might be at risk for infection with the SARS-CoV-2 virus that causes COVID-19. Institutional protocols and algorithms that pertain to the evaluation of patients at risk for COVID-19 are in a state of rapid change based on information released by regulatory bodies including the CDC and federal and state organizations. These policies and algorithms were followed during the patient's care in the ED.  This patient complains of shortness of breath; this involves an extensive number of treatment Options and is a complaint that carries with it a high risk of complications and Morbidity. The differential includes CHF, pneumonia, anemia, metabolic derangement, renal failure  I ordered, reviewed and interpreted labs, which included CBC with normal white count, hemoglobin low compared to priors chemistries fairly unremarkable, BNP elevated, D-dimer elevated I ordered medication IV Lasix subcu Lovenox I ordered imaging studies which included chest x-ray and I independently    visualized and interpreted imaging which showed congestive heart failure Previous records obtained and reviewed in epic including prior cardiology notes I consulted Dr. Josephine Cables [facility] hospitalist and discussed lab and imaging findings  Critical Interventions: None  After the interventions stated above, I reevaluated the patient and found patient to be fairly asymptomatic.  He is requiring higher level oxygenation.  He  will be admitted to the hospital on Lovenox until CT or VQ scan.  Patient agreeable to plan CHA2DS2/VAS Stroke Risk Points  Current as of 17 minutes ago     5 >= 2 Points: High Risk  1 - 1.99 Points: Medium Risk  0 Points: Low Risk    Last Change: N/A      Details    This score determines the patient's risk of having a stroke if the  patient has atrial fibrillation.       Points Metrics  1 Has Congestive Heart Failure:  Yes    Current as of 17 minutes ago  1 Has Vascular Disease:  Yes    Current as of 17 minutes ago  1 Has Hypertension:  Yes    Current as of 17 minutes ago  2 Age:  24    Current as of 17 minutes ago  0 Has Diabetes:  No  Current as of 17 minutes ago  0 Had Stroke:  No  Had TIA:  No  Had Thromboembolism:  No    Current as of 17 minutes ago  0 Male:  No    Current as of 17 minutes ago            Final Clinical Impression(s) / ED Diagnoses Final diagnoses:  Acute on chronic congestive heart failure, unspecified heart failure type ([place])  Chronic atrial fibrillation (HCC)  Elevated d-dimer    Rx / DC Orders ED Discharge Orders     None        Hayden Rasmussen, MD 01/26/21 1056

## 2021-01-25 NOTE — ED Triage Notes (Signed)
Presents via EMS from home per PCP direction for leg edema, shob with low O2 sats -80 % on baseline 3L, wt gain>10lb I 2 wks. H/o chf, cirrhosis. Arrives A&Ox4.

## 2021-01-25 NOTE — ED Notes (Signed)
Able to titrate O2 to 4L with pt sat 91%. Pt insists that he would like to try walk test because would like to go home, "I feel so much better." While getting up to sit on edge of bed, desat to 79% on 4L. Activity discontinued and returned pt to bed.

## 2021-01-26 ENCOUNTER — Inpatient Hospital Stay (HOSPITAL_COMMUNITY): Payer: No Typology Code available for payment source

## 2021-01-26 ENCOUNTER — Encounter (HOSPITAL_COMMUNITY): Payer: Self-pay | Admitting: Internal Medicine

## 2021-01-26 DIAGNOSIS — I5033 Acute on chronic diastolic (congestive) heart failure: Secondary | ICD-10-CM

## 2021-01-26 DIAGNOSIS — E8809 Other disorders of plasma-protein metabolism, not elsewhere classified: Secondary | ICD-10-CM | POA: Diagnosis present

## 2021-01-26 DIAGNOSIS — D539 Nutritional anemia, unspecified: Secondary | ICD-10-CM | POA: Diagnosis present

## 2021-01-26 DIAGNOSIS — R7989 Other specified abnormal findings of blood chemistry: Secondary | ICD-10-CM | POA: Diagnosis present

## 2021-01-26 DIAGNOSIS — J9621 Acute and chronic respiratory failure with hypoxia: Secondary | ICD-10-CM | POA: Insufficient documentation

## 2021-01-26 LAB — MAGNESIUM: Magnesium: 2.4 mg/dL (ref 1.7–2.4)

## 2021-01-26 LAB — ECHOCARDIOGRAM COMPLETE
AR max vel: 2.17 cm2
AV Area VTI: 2.18 cm2
AV Area mean vel: 2.3 cm2
AV Mean grad: 13 mmHg
AV Peak grad: 27.6 mmHg
Ao pk vel: 2.63 m/s
Area-P 1/2: 2.42 cm2
Height: 68 in
MV VTI: 2.19 cm2
P 1/2 time: 549 msec
S' Lateral: 2.5 cm
Weight: 2878.33 oz

## 2021-01-26 LAB — COMPREHENSIVE METABOLIC PANEL
ALT: 13 U/L (ref 0–44)
AST: 20 U/L (ref 15–41)
Albumin: 2.8 g/dL — ABNORMAL LOW (ref 3.5–5.0)
Alkaline Phosphatase: 48 U/L (ref 38–126)
BUN: 39 mg/dL — ABNORMAL HIGH (ref 8–23)
CO2: >45 mmol/L — ABNORMAL HIGH (ref 22–32)
Calcium: 8.5 mg/dL — ABNORMAL LOW (ref 8.9–10.3)
Chloride: 92 mmol/L — ABNORMAL LOW (ref 98–111)
Creatinine, Ser: 1.01 mg/dL (ref 0.61–1.24)
GFR, Estimated: >60 mL/min (ref >60)
Glucose, Bld: 89 mg/dL (ref 70–99)
Potassium: 4.1 mmol/L (ref 3.5–5.1)
Sodium: 143 mmol/L (ref 135–145)
Total Bilirubin: 1.2 mg/dL (ref 0.3–1.2)
Total Protein: 5.9 g/dL — ABNORMAL LOW (ref 6.5–8.1)

## 2021-01-26 LAB — CBC
HCT: 33.6 % — ABNORMAL LOW (ref 39.0–52.0)
Hemoglobin: 9.7 g/dL — ABNORMAL LOW (ref 13.0–17.0)
MCH: 31 pg (ref 26.0–34.0)
MCHC: 28.9 g/dL — ABNORMAL LOW (ref 30.0–36.0)
MCV: 107.3 fL — ABNORMAL HIGH (ref 80.0–100.0)
Platelets: 125 10*3/uL — ABNORMAL LOW (ref 150–400)
RBC: 3.13 MIL/uL — ABNORMAL LOW (ref 4.22–5.81)
RDW: 15.9 % — ABNORMAL HIGH (ref 11.5–15.5)
WBC: 5.3 10*3/uL (ref 4.0–10.5)
nRBC: 0 % (ref 0.0–0.2)

## 2021-01-26 LAB — FOLATE: Folate: 8.5 ng/mL (ref >5.9)

## 2021-01-26 LAB — PHOSPHORUS: Phosphorus: 3.4 mg/dL (ref 2.5–4.6)

## 2021-01-26 LAB — APTT: aPTT: 35 seconds (ref 24–36)

## 2021-01-26 LAB — VITAMIN B12: Vitamin B-12: 751 pg/mL (ref 180–914)

## 2021-01-26 MED ORDER — IPRATROPIUM-ALBUTEROL 0.5-2.5 (3) MG/3ML IN SOLN
3.0000 mL | RESPIRATORY_TRACT | Status: DC | PRN
  Administered 2021-01-28 – 2021-01-29 (×2): 3 mL via RESPIRATORY_TRACT
  Filled 2021-01-26 (×2): qty 3

## 2021-01-26 MED ORDER — SPIRONOLACTONE 25 MG PO TABS
25.0000 mg | ORAL_TABLET | Freq: Every day | ORAL | Status: DC
  Administered 2021-01-26 – 2021-01-31 (×6): 25 mg via ORAL
  Filled 2021-01-26 (×6): qty 1

## 2021-01-26 MED ORDER — APIXABAN 5 MG PO TABS
5.0000 mg | ORAL_TABLET | Freq: Two times a day (BID) | ORAL | Status: DC
  Administered 2021-01-26 – 2021-01-31 (×9): 5 mg via ORAL
  Filled 2021-01-26 (×9): qty 1

## 2021-01-26 MED ORDER — PANTOPRAZOLE SODIUM 40 MG PO TBEC
40.0000 mg | DELAYED_RELEASE_TABLET | Freq: Every day | ORAL | Status: DC
  Administered 2021-01-26 – 2021-01-31 (×6): 40 mg via ORAL
  Filled 2021-01-26 (×6): qty 1

## 2021-01-26 MED ORDER — TECHNETIUM TO 99M ALBUMIN AGGREGATED
4.0000 | Freq: Once | INTRAVENOUS | Status: AC | PRN
  Administered 2021-01-26: 4.3 via INTRAVENOUS

## 2021-01-26 MED ORDER — ACETAMINOPHEN 325 MG PO TABS: 650.0000 mg | ORAL_TABLET | Freq: Four times a day (QID) | ORAL | Status: DC | PRN

## 2021-01-26 MED ORDER — ENSURE ENLIVE PO LIQD
237.0000 mL | Freq: Two times a day (BID) | ORAL | Status: DC
  Administered 2021-01-29: 10:00:00 237 mL via ORAL

## 2021-01-26 NOTE — TOC Initial Note (Signed)
Transition of Care [facility]) - Initial/Assessment Note    Patient Details  Name: Joshua [name] MRN: [id number] Date of Birth: 11-Feb-1946  [facility] CM/SW Contact:    Iona Beard, [place] Phone Number: 01/26/2021, 1:23 PM  Clinical Narrative:                 [facility] consulted for CHF screen. CSW met with pt in room to complete assessment. Pt states that he lives with his daughter. Pt is able to complete ADLs independently. Pt is able to provide his own transportation. Pt states he has not had [place] services. Pt states that he has a bsc and walker to use in the home. Pt states that he wears 3L O2 supplied through [facility].   CSW completed CHF screen with pt. Pt states that he follows a heart healthy diet. Pt takes medications as he should. Pt states that he does follow a heart healthy diet. Pts cardiologist is Dr. Claiborne Billings. TOC to follow.   Expected Discharge Plan: Home/Self Care Barriers to Discharge: Continued Medical Work up   Patient Goals and CMS Choice Patient states their goals for this hospitalization and ongoing recovery are:: Return home CMS Medicare.gov Compare Post Acute Care list provided to:: Patient Choice offered to / list presented to : Patient  Expected Discharge Plan and Services Expected Discharge Plan: Home/Self Care In-house Referral: Clinical Social Work Discharge Planning Services: CM Consult   Living arrangements for the past 2 months: Single Family Home                                      Prior Living Arrangements/Services Living arrangements for the past 2 months: Single Family Home Lives with:: Adult Children Patient language and need for interpreter reviewed:: Yes Do you feel safe going back to the place where you live?: Yes      Need for Family Participation in Patient Care: Yes (Comment) Care giver support system in place?: Yes (comment) Current home services: DME, Home PT, Home RN Criminal Activity/Legal Involvement Pertinent to  Current Situation/Hospitalization: No - Comment as needed  Activities of Daily Living Home Assistive Devices/Equipment: None ADL Screening (condition at time of admission) Patient's cognitive ability adequate to safely complete daily activities?: Yes Is the patient deaf or have difficulty hearing?: No Does the patient have difficulty seeing, even when wearing glasses/contacts?: No Does the patient have difficulty concentrating, remembering, or making decisions?: No Patient able to express need for assistance with ADLs?: Yes Does the patient have difficulty dressing or bathing?: No Independently performs ADLs?: Yes (appropriate for developmental age) Does the patient have difficulty walking or climbing stairs?: No Weakness of Legs: Both Weakness of Arms/Hands: None  Permission Sought/Granted                  Emotional Assessment Appearance:: Appears stated age Attitude/Demeanor/Rapport: Engaged Affect (typically observed): Accepting Orientation: : Oriented to Self, Oriented to Place, Oriented to  Time, Oriented to Situation Alcohol / Substance Use: Not Applicable Psych Involvement: No (comment)  Admission diagnosis:  Acute exacerbation of CHF (congestive heart failure) (HCC) [I50.9] Patient Active Problem List   Diagnosis Date Noted   Elevated brain natriuretic peptide (BNP) level 01/26/2021   Macrocytic anemia 01/26/2021   Hypoalbuminemia 01/26/2021   Elevated d-dimer 01/26/2021   Acute on chronic respiratory failure with hypoxia ([place]) 01/26/2021   Acute exacerbation of CHF (congestive heart failure) ([place])  01/25/2021   Acute on chronic diastolic CHF (congestive heart failure), NYHA class 4 ([place]) 12/09/2020   Acute on chronic combined systolic and diastolic CHF, NYHA class 4 ([place]) 12/09/2020   Acute on chronic diastolic CHF (congestive heart failure) ([place]) 12/09/2020   Mitral regurgitation 11/29/2020   Aortic stenosis 11/29/2020   Tricuspid regurgitation 11/29/2020    Congestive heart failure (CHF) ([place]) 11/21/2020   Acute on chronic diastolic (congestive) heart failure ([place]) 11/21/2020   Chronic diastolic heart failure ([place]) 10/13/2020   Chronic obstructive pulmonary disease, unspecified ([place]) 09/14/2020   Splenic infarct    Rectal bleeding 09/05/2020   Atrial fibrillation, chronic ([place]) 01/12/2020   Adenomatous polyps 12/13/2016   Long term (current) use of anticoagulants [Z79.01] 06/14/2016   CAD in native artery 01/17/2016   HOCM (hypertrophic obstructive cardiomyopathy) ([place]) 07/16/2015   Hepatic cirrhosis ([place])    Syncope and collapse 01/14/2015   Erectile dysfunction 11/10/2014   Thrombocytopenia ([place]) 01/30/2014   Hyperlipidemia LDL goal <70 12/03/2013   High risk medication use 05/21/2013   BPH (benign prostatic hyperplasia) 01/29/2013   Family history of colon cancer 01/29/2013   PAF (paroxysmal atrial fibrillation) ([place]) 12/17/2012   Aortic valve insufficiency 12/17/2012   Mild mitral stenosis 12/17/2012   COPD (chronic obstructive pulmonary disease) ([place]) 05/05/2010   Hypertrophic obstructive cardiomyopathy ([place]) 03/31/2010   B12 deficiency 10/11/2008   Coronary atherosclerosis 10/11/2008   HIATAL HERNIA WITH REFLUX 10/11/2008   COLONIC POLYPS, ADENOMATOUS, HX OF 10/11/2008   PCP:  [name], [name], MD Pharmacy:   [place], [place] - [place] [address] [place] [address] Phone: [phone] Fax: [phone]  [facility] [address] [place] [address] Phone: [phone] Fax: [phone]     Social Determinants of Health (SDOH) Interventions    Readmission Risk Interventions Readmission Risk Prevention Plan 01/26/2021  Transportation Screening Complete  HRI or Home Care Consult Complete  Social Work Consult for [place] Planning/Counseling Complete  Palliative Care Screening  Not Applicable  Medication Review Press photographer) Complete  Some recent data might be hidden

## 2021-01-26 NOTE — Progress Notes (Signed)
Patient refused ensure, stated "I dont like that stuff."

## 2021-01-26 NOTE — Progress Notes (Signed)
[facility] has been notified of pts admission. The [place] notification is [phone].

## 2021-01-26 NOTE — Progress Notes (Deleted)
Cardiology Office Note   Date:  01/26/2021   ID:  Joshua [name], DOB 12-Sep-1945, MRN [id number]  PCP:  [name], [name], MD  Cardiologist:   No chief complaint on file.    History of Present Illness: Joshua [name] is a 75 y.o. male who presents for ***    Past Medical History:  Diagnosis Date   Acute lower GI bleeding    related to gastritis / viral infection   Cataract    Cirrhosis ([place])    Congestive heart failure (CHF) ([place]) 11/21/2020   COPD (chronic obstructive pulmonary disease) ([place])    PFT 05-05-2010, FEV1 .9 (26%) ratio 60   Coronary artery disease    Gastritis 06-10-2006   Hiatal hernia 06-10-2006   EGD   HOH (hard of hearing)    Hx of adenomatous colonic polyps 2005, 2013   Colonoscopy   Hypercholesteremia    Hypertension    Hypertr obst cardiomyop    mild subaortic stenosis   Iron deficiency anemia, unspecified    LVH (left ventricular hypertrophy)    Other B-complex deficiencies    Persistent atrial fibrillation (HCC)    persistent   Rheumatic fever    Thrombocytopenia ([place]) 05/2008    Past Surgical History:  Procedure Laterality Date   ATRIAL FLUTTER ABLATION  06/2010   CTI ablation performed by [name]   CARDIAC CATHETERIZATION  05/28/2005   Widely patent coronaries and normal LV function.   CARDIAC CATHETERIZATION  08/11/2001   80% LAD stenosis successfully stented with a 3.5x65mm Cypher DES postdilated to 3.5mm resulting in reduction of 80% to 0%.   CARDIAC CATHETERIZATION  01/15/2001   Focal 95% proximal RCA stenosis, stented with a 3x59mm Zeta multilink stent with postdilatation utilizing a 3.5x44mm Quantum balloon with stenosis being reduced to 0%.   CARDIOVASCULAR STRESS TEST  05/29/2011   No scintigraphic evidence of inducible myocardial ischemia. No ECG changes. EKG negative for ischemia.   CARDIOVERSION N/A 10/20/2019   Procedure: CARDIOVERSION;  Surgeon: Sanda Klein, MD;  Location: [facility] ENDOSCOPY;  Service: Cardiovascular;  Laterality: N/A;    CARDIOVERSION N/A 01/14/2020   Procedure: CARDIOVERSION;  Surgeon: Buford Dresser, MD;  Location: [facility] ENDOSCOPY;  Service: Cardiovascular;  Laterality: N/A;   CORONARY ANGIOPLASTY     2002-2003   SHOULDER SURGERY     TRANSTHORACIC ECHOCARDIOGRAM  05/29/2011   EF >70%, severe concentric LV hypertrophy, severe mitral annular calcification, mild-moderate aortic regurg,      No current facility-administered medications for this visit.   No current outpatient medications on file.   Facility-Administered Medications Ordered in Other Visits  Medication Dose Route Frequency Provider Last Rate Last Admin   acetaminophen (TYLENOL) tablet 650 mg  650 mg Oral Q6H PRN Adefeso, Oladapo, DO       feeding supplement (ENSURE ENLIVE / ENSURE PLUS) liquid 237 mL  237 mL Oral BID BM Adefeso, Oladapo, DO       furosemide (LASIX) injection 40 mg  40 mg Intravenous Q12H Adefeso, Oladapo, DO   40 mg at 01/26/21 1104   ipratropium-albuterol (DUONEB) 0.5-2.5 (3) MG/3ML nebulizer solution 3 mL  3 mL Nebulization Q4H PRN Adefeso, Oladapo, DO        Allergies:   Patient has no known allergies.    Social History:  The patient  reports that he quit smoking about 35 years ago. His smoking use included cigarettes. He has a 10.00 pack-year smoking history. He has never used smokeless tobacco. He reports that he does not  drink alcohol and does not use drugs.   Family History:  The patient's family history includes Asthma in his brother; CAD in his father and paternal uncle; Cancer in his maternal grandfather; Colon cancer in his maternal grandfather; Emphysema in his brother; Heart attack (age of onset: 20) in his brother; Heart disease in his mother; Hyperlipidemia in his father; Hypertension in his sister; Lung cancer in his brother; Stroke in his mother and paternal uncle.    ROS: All other systems are reviewed and negative. Unless otherwise mentioned in H&P    PHYSICAL EXAM: VS:  There were no vitals  taken for this visit. , BMI There is no height or weight on file to calculate BMI. GEN: Well nourished, well developed, in no acute distress HEENT: normal Neck: no JVD, carotid bruits, or masses Cardiac: ***RRR; no murmurs, rubs, or gallops,no edema  Respiratory:  Clear to auscultation bilaterally, normal work of breathing GI: soft, nontender, nondistended, + BS MS: no deformity or atrophy Skin: warm and dry, no rash Neuro:  Strength and sensation are intact Psych: euthymic mood, full affect   EKG:  EKG {ACTION; IS/IS CWU:[id number]} ordered today. The ekg ordered today demonstrates ***   Recent Labs: 12/09/2020: TSH 3.142 01/25/2021: B Natriuretic Peptide 1,059.0 01/26/2021: ALT 13; BUN 39; Creatinine, Ser 1.01; Hemoglobin 9.7; Magnesium 2.4; Platelets 125; Potassium 4.1; Sodium 143    Lipid Panel    Component Value Date/Time   CHOL 151 01/12/2021 1123   CHOL 126 08/20/2012 0930   TRIG 92 01/12/2021 1123   TRIG 113 10/31/2016 0850   TRIG 94 08/20/2012 0930   HDL 70 01/12/2021 1123   HDL 46 10/31/2016 0850   HDL 41 08/20/2012 0930   CHOLHDL 2.2 01/12/2021 1123   LDLCALC 64 01/12/2021 1123   LDLCALC 63 09/29/2013 0907   LDLCALC 66 08/20/2012 0930      Wt Readings from Last 3 Encounters:  01/26/21 179 lb 14.3 oz (81.6 kg)  01/25/21 180 lb (81.6 kg)  01/06/21 174 lb (78.9 kg)      Other studies Reviewed: Additional studies/ records that were reviewed today include: ***. Review of the above records demonstrates: ***   ASSESSMENT AND PLAN:  1.  ***   Current medicines are reviewed at length with the patient today.  I have spent *** dedicated to the care of this patient on the date of this encounter to include pre-visit review of records, assessment, management and diagnostic testing,with shared decision making.  Labs/ tests ordered today include: *** Phill Myron. West Pugh, ANP, AACC   01/26/2021 3:15 PM    [facility]  [address] Office [phone] Fax [phone]  Notice: This dictation was prepared with Dragon dictation along with smaller phrase technology. Any transcriptional errors that result from this process are unintentional and may not be corrected upon review.

## 2021-01-26 NOTE — Progress Notes (Signed)
*  PRELIMINARY RESULTS* Echocardiogram 2D Echocardiogram has been performed.  Joshua [name] 01/26/2021, 3:17 PM

## 2021-01-26 NOTE — Progress Notes (Signed)
PROGRESS NOTE     Joshua [name], is a 75 y.o. male, DOB - November 21, 1945, TMA:[id number]  Admit date - 01/25/2021   Admitting Physician Bernadette Hoit, DO  Outpatient Primary MD for the patient is [name], [name], MD  LOS - 1  Chief Complaint  Patient presents with   Shortness of Breath        Brief Narrative:  75 y.o. male with medical history significant for HFpEF, CAD s/p prior PCI to the LAD and RCA, hypertension, COPD on home oxygen (3L), persistent atrial fibrillation/flutter, recent GI bleed due to internal hemorrhoids (July 2022) and on Eliquis for CVA admitted on 01/25/2021 cute on chronic hypoxic respiratory failure secondary to CHF exacerbation  Assessment & Plan:   Principal Problem:   Acute on chronic diastolic (congestive) heart failure/Hypertrophic Cardiomyopathy Active Problems:   COPD (chronic obstructive pulmonary disease) ([place])   CAD in native artery   Atrial fibrillation, chronic (HCC)   Elevated brain natriuretic peptide (BNP) level   Macrocytic anemia   Hypoalbuminemia   Elevated d-dimer   Acute on chronic respiratory failure with hypoxia ([place])   1) acute on chronic  hypoxic respiratory failure secondary to CHF exacerbation -Echo with EF of 65 to 70% with hypertrophic cardiomyopathy -Continue IV Lasix, fluid input and output monitoring and daily weights -D-dimer elevated, VQ scan intermediate for PE clinically low index of suspicion for PE--- patient is already on Eliquis for atrial fibrillation -Continue Aldactone  2)H/o CAD--status post prior angioplasty and stent placement -Chest pain-free -continue Crestor  3) chronic atrial fibrillation----with slow ventricular response -PTA patient was on Tikosyn -Restart Eliquis  4) COPD--- no acute exacerbation at this time continue bronchodilators hold off on steroids  Disposition/Need for in-Hospital Stay- patient unable to be discharged at this time due to --- acute on chronic hypoxic respiratory  failure requiring IV diuresis and increased supplemental oxygen  Status is: Inpatient  Remains inpatient appropriate because: Disposition above  Disposition: The patient is from: Home              Anticipated d/c is to: Home              Anticipated d/c date is: 2 days              Patient currently is not medically stable to d/c. Barriers: Not Clinically Stable-   Code Status :  -  Code Status: Full Code   Family Communication:    NA (patient is alert, awake and coherent)   Consults  :  Na  DVT Prophylaxis  :   - SCDs   SCDs Start: 01/25/21 2331    Lab Results  Component Value Date   PLT 125 (L) 01/26/2021    Inpatient Medications  Scheduled Meds:  feeding supplement  237 mL Oral BID BM   furosemide  40 mg Intravenous Q12H   Continuous Infusions: PRN Meds:.acetaminophen, ipratropium-albuterol   Anti-infectives (From admission, onward)    None         Subjective: Joshua [name] today has no fevers, no emesis,  No chest pain,   - Shortness of breath is not worse,  -No productive cough   Objective: Vitals:   01/26/21 0300 01/26/21 0323 01/26/21 0454 01/26/21 1341  BP: (!) 111/44  (!) 119/52 (!) 109/52  Pulse: (!) 57  68 65  Resp: 19  19 18   Temp: (!) 97.1 F (36.2 C)  98 F (36.7 C) 98.2 F (36.8 C)  TempSrc:  SpO2: 92%  91% 92%  Weight:  81.6 kg    Height:        Intake/Output Summary (Last 24 hours) at 01/26/2021 1911 Last data filed at 01/26/2021 1823 Gross per 24 hour  Intake 720 ml  Output 1850 ml  Net -1130 ml   Filed Weights   01/25/21 1455 01/25/21 2331 01/26/21 0323  Weight: 80.3 kg 81.6 kg 81.6 kg    Physical Exam  Gen:- Awake Alert, no conversational dyspnea patient has dyspnea on exertion HEENT:- [place].AT, No sclera icterus Neck-Supple Neck,No JVD,.  Lungs-diminished breath sounds bibasilar rales  CV- S1, S2 normal, irregular  Abd-  +ve B.Sounds, Abd Soft, No tenderness,    Extremity/Skin:- +2 edema, pedal pulses present   Psych-affect is appropriate, oriented x3 Neuro-generalized weakness, no new focal deficits, no tremors  Data Reviewed: I have personally reviewed following labs and imaging studies  CBC: Recent Labs  Lab 01/25/21 1535 01/26/21 0525  WBC 5.7 5.3  NEUTROABS 4.5  --   HGB 10.0* 9.7*  HCT 35.7* 33.6*  MCV 107.5* 107.3*  PLT 126* 916*   Basic Metabolic Panel: Recent Labs  Lab 01/25/21 1535 01/26/21 0525  NA 141 143  K 4.4 4.1  CL 92* 92*  CO2 41* >45*  GLUCOSE 94 89  BUN 44* 39*  CREATININE 1.13 1.01  CALCIUM 8.8* 8.5*  MG  --  2.4  PHOS  --  3.4   GFR: Estimated Creatinine Clearance: 60.2 mL/min (by C-G formula based on SCr of 1.01 mg/dL). Liver Function Tests: Recent Labs  Lab 01/25/21 1500 01/26/21 0525  AST 23 20  ALT 14 13  ALKPHOS 55 48  BILITOT 2.2* 1.2  PROT 6.5 5.9*  ALBUMIN 3.1* 2.8*   No results for input(s): LIPASE, AMYLASE in the last 168 hours. No results for input(s): AMMONIA in the last 168 hours. Coagulation Profile: No results for input(s): INR, PROTIME in the last 168 hours. Cardiac Enzymes: No results for input(s): CKTOTAL, CKMB, CKMBINDEX, TROPONINI in the last 168 hours. BNP (last 3 results) No results for input(s): PROBNP in the last 8760 hours. HbA1C: No results for input(s): HGBA1C in the last 72 hours. CBG: No results for input(s): GLUCAP in the last 168 hours. Lipid Profile: No results for input(s): CHOL, HDL, LDLCALC, TRIG, CHOLHDL, LDLDIRECT in the last 72 hours. Thyroid Function Tests: No results for input(s): TSH, T4TOTAL, FREET4, T3FREE, THYROIDAB in the last 72 hours. Anemia Panel: Recent Labs    01/26/21 0525  VITAMINB12 751  FOLATE 8.5   Urine analysis:    Component Value Date/Time   COLORURINE YELLOW 01/14/2015 1112   APPEARANCEUR Clear 10/29/2019 0924   LABSPEC 1.019 01/14/2015 1112   PHURINE 5.0 01/14/2015 1112   GLUCOSEU Negative 10/29/2019 0924   HGBUR LARGE (A) 01/14/2015 1112   BILIRUBINUR Negative  10/29/2019 0924   KETONESUR NEGATIVE 01/14/2015 1112   PROTEINUR 2+ (A) 10/29/2019 0924   PROTEINUR 100 (A) 01/14/2015 1112   UROBILINOGEN negative 02/23/2015 1253   NITRITE Negative 10/29/2019 0924   NITRITE NEGATIVE 01/14/2015 1112   LEUKOCYTESUR Negative 10/29/2019 0924   Sepsis Labs: @LABRCNTIP (procalcitonin:4,lacticidven:4)  ) Recent Results (from the past 240 hour(s))  Resp Panel by RT-PCR (Flu A&B, Covid) Nasopharyngeal Swab     Status: None   Collection Time: 01/25/21  5:20 PM   Specimen: Nasopharyngeal Swab; Nasopharyngeal(NP) swabs in vial transport medium  Result Value Ref Range Status   SARS Coronavirus 2 by RT PCR NEGATIVE NEGATIVE Final  Comment: (NOTE) SARS-CoV-2 target nucleic acids are NOT DETECTED.  The SARS-CoV-2 RNA is generally detectable in upper respiratory specimens during the acute phase of infection. The lowest concentration of SARS-CoV-2 viral copies this assay can detect is 138 copies/mL. A negative result does not preclude SARS-Cov-2 infection and should not be used as the sole basis for treatment or other patient management decisions. A negative result may occur with  improper specimen collection/handling, submission of specimen other than nasopharyngeal swab, presence of viral mutation(s) within the areas targeted by this assay, and inadequate number of viral copies(<138 copies/mL). A negative result must be combined with clinical observations, patient history, and epidemiological information. The expected result is Negative.  Fact Sheet for Patients:  EntrepreneurPulse.com.au  Fact Sheet for Healthcare Providers:  IncredibleEmployment.be  This test is no t yet approved or cleared by the [place] FDA and  has been authorized for detection and/or diagnosis of SARS-CoV-2 by FDA under an Emergency Use Authorization (EUA). This EUA will remain  in effect (meaning this test can be used) for the duration of  the COVID-19 declaration under Section 564(b)(1) of the Act, 21 U.S.C.section 360bbb-3(b)(1), unless the authorization is terminated  or revoked sooner.       Influenza A by PCR NEGATIVE NEGATIVE Final   Influenza B by PCR NEGATIVE NEGATIVE Final    Comment: (NOTE) The Xpert Xpress SARS-CoV-2/FLU/RSV plus assay is intended as an aid in the diagnosis of influenza from Nasopharyngeal swab specimens and should not be used as a sole basis for treatment. Nasal washings and aspirates are unacceptable for Xpert Xpress SARS-CoV-2/FLU/RSV testing.  Fact Sheet for Patients: EntrepreneurPulse.com.au  Fact Sheet for Healthcare Providers: IncredibleEmployment.be  This test is not yet approved or cleared by the [place] FDA and has been authorized for detection and/or diagnosis of SARS-CoV-2 by FDA under an Emergency Use Authorization (EUA). This EUA will remain in effect (meaning this test can be used) for the duration of the COVID-19 declaration under Section 564(b)(1) of the Act, 21 U.S.C. section 360bbb-3(b)(1), unless the authorization is terminated or revoked.  Performed at [facility] [address] [place], [place] [address]       Radiology Studies: NM Pulmonary Perfusion  Result Date: 01/26/2021 CLINICAL DATA:  Pulmonary emboli suspected. Low/intermediate probability. Abnormal D-dimer. EXAM: NUCLEAR MEDICINE PERFUSION LUNG SCAN TECHNIQUE: Perfusion images were obtained in multiple projections after intravenous injection of radiopharmaceutical. Ventilation scans intentionally deferred if perfusion scan and chest x-ray adequate for interpretation during COVID 19 epidemic. RADIOPHARMACEUTICALS:  4.3 mCi Tc-51m MAA IV COMPARISON:  Chest radiography yesterday. FINDINGS: There are patchy subsegmental perfusion abnormalities in the mid and lower lungs bilaterally. Because of chest radiographic abnormalities in these regions, the study is of  intermediate probability of pulmonary emboli. No perfusion abnormalities are seen in the upper lungs. IMPRESSION: Scattered subsegmental perfusion abnormalities in the mid and lower lungs bilaterally. There are chest radiographic abnormalities in these regions however, and therefore the findings are of intermediate probability of pulmonary emboli. Electronically Signed   By: Nelson Chimes M.D.   On: 01/26/2021 13:34   DG Chest Port 1 View  Result Date: 01/25/2021 CLINICAL DATA:  Shortness of breath. Lower extremity edema. Poor oxygen saturation. EXAM: PORTABLE CHEST 1 VIEW COMPARISON:  12/10/2020 FINDINGS: Artifact overlies the chest. There is cardiomegaly and aortic atherosclerosis. There chronic calcified pleural plaques. There is pulmonary venous hypertension, possibly with mild interstitial edema. May be small pleural effusions. No acute bone finding. IMPRESSION: Probable congestive heart failure. Cardiomegaly,  pulmonary venous hypertension, mild edema and small effusions. The patient also has chronic calcified pleural plaques. Electronically Signed   By: Nelson Chimes M.D.   On: 01/25/2021 15:33   ECHOCARDIOGRAM COMPLETE  Result Date: 01/26/2021    ECHOCARDIOGRAM REPORT   Patient Name:   Joshua [name] Date of Exam: 01/26/2021 Medical Rec #:  [id number]     Height:       68.0 in Accession #:    [id number]    Weight:       179.9 lb Date of Birth:  1945-07-03      BSA:          1.954 m Patient Age:    25 years      BP:           119/52 mmHg Patient Gender: M             HR:           62` bpm. Exam Location:  [facility] Procedure: 2D Echo, Cardiac Doppler and Color Doppler Indications:    CHF  History:        Patient has prior history of Echocardiogram examinations, most                 recent 10/05/2020. CHF and Hypertrophic Cardiomyopathy, CAD,                 COPD, Arrythmias:Atrial Fibrillation; Risk Factors:Hypertension.  Sonographer:    Wenda Low Referring Phys: [id number] OLADAPO ADEFESO IMPRESSIONS   1. Left ventricular ejection fraction, by estimation, is 65 to 70%. The left ventricle has normal function. The left ventricle has no regional wall motion abnormalities. There is severe asymmetric left ventricular hypertrophy of the septal and basal segments (18 mm) consistent with known diagnosis of hypertrophic cardiomyopathy. No significant resting LVOT gradient based on limited interrogation. Left ventricular diastolic parameters are indeterminate.  2. Right ventricular systolic function is mildly reduced. The right ventricular size is mildly enlarged. There is severely elevated pulmonary artery systolic pressure. The estimated right ventricular systolic pressure is 30.8 mmHg.  3. Left atrial size was moderately dilated.  4. Right atrial size was mildly dilated.  5. The mitral valve is abnormal. Mild to moderate mitral valve regurgitation. Moderate to severe mitral stenosis. The mean mitral valve gradient is 8.0 mmHg. Severe mitral annular calcification.  6. Tricuspid valve regurgitation is moderate to severe.  7. The aortic valve is tricuspid. There is mild calcification of the aortic valve. Aortic valve regurgitation is moderate. Mild aortic valve stenosis. Aortic regurgitation PHT measures 549 msec. Aortic valve mean gradient measures 13.0 mmHg.  8. Pulmonic valve regurgitation is moderate.  9. The inferior vena cava is dilated in size with <50% respiratory variability, suggesting right atrial pressure of 15 mmHg. Comparison(s): No significant change from prior study. Prior images reviewed side by side. FINDINGS  Left Ventricle: Left ventricular ejection fraction, by estimation, is 65 to 70%. The left ventricle has normal function. The left ventricle has no regional wall motion abnormalities. The left ventricular internal cavity size was normal in size. There is  severe asymmetric left ventricular hypertrophy of the septal and basal segments. Left ventricular diastolic parameters are indeterminate. Right  Ventricle: The right ventricular size is mildly enlarged. No increase in right ventricular wall thickness. Right ventricular systolic function is mildly reduced. There is severely elevated pulmonary artery systolic pressure. The tricuspid regurgitant velocity is 3.60 m/s, and with an assumed right atrial pressure of 15 mmHg, the estimated  right ventricular systolic pressure is 62.9 mmHg. Left Atrium: Left atrial size was moderately dilated. Right Atrium: Right atrial size was mildly dilated. Pericardium: There is no evidence of pericardial effusion. Mitral Valve: The mitral valve is abnormal. There is mild thickening of the mitral valve leaflet(s). There is moderate calcification of the mitral valve leaflet(s). Moderately decreased mobility of the mitral valve leaflets. Severe mitral annular calcification. Mild to moderate mitral valve regurgitation. Moderate to severe mitral valve stenosis. MV peak gradient, 21.5 mmHg. The mean mitral valve gradient is 8.0 mmHg. Tricuspid Valve: The tricuspid valve is grossly normal. Tricuspid valve regurgitation is moderate to severe. Aortic Valve: The aortic valve is tricuspid. There is mild calcification of the aortic valve. There is mild aortic valve annular calcification. Aortic valve regurgitation is moderate. Aortic regurgitation PHT measures 549 msec. Mild aortic stenosis is present. Aortic valve mean gradient measures 13.0 mmHg. Aortic valve peak gradient measures 27.6 mmHg. Aortic valve area, by VTI measures 2.18 cm. Pulmonic Valve: The pulmonic valve was grossly normal. Pulmonic valve regurgitation is moderate. Aorta: The aortic root is normal in size and structure. Venous: The inferior vena cava is dilated in size with less than 50% respiratory variability, suggesting right atrial pressure of 15 mmHg. IAS/Shunts: No atrial level shunt detected by color flow Doppler.  LEFT VENTRICLE PLAX 2D LVIDd:         4.00 cm   Diastology LVIDs:         2.50 cm   LV e' medial:     5.84 cm/s LV PW:         1.90 cm   LV E/e' medial:  37.5 LV IVS:        1.80 cm   LV e' lateral:   9.28 cm/s LVOT diam:     2.00 cm   LV E/e' lateral: 23.6 LV SV:         118 LV SV Index:   60 LVOT Area:     3.14 cm  RIGHT VENTRICLE RV Basal diam:  5.00 cm RV S prime:     9.89 cm/s LEFT ATRIUM            Index        RIGHT ATRIUM           Index LA diam:      4.80 cm  2.46 cm/m   RA Area:     24.50 cm LA Vol (A2C): 130.0 ml 66.54 ml/m  RA Volume:   71.80 ml  36.75 ml/m LA Vol (A4C): 74.7 ml  38.23 ml/m  AORTIC VALVE                     PULMONIC VALVE AV Area (Vmax):    2.17 cm      PV Vmax:       1.45 m/s AV Area (Vmean):   2.30 cm      PV Peak grad:  8.4 mmHg AV Area (VTI):     2.18 cm AV Vmax:           262.50 cm/s AV Vmean:          164.000 cm/s AV VTI:            0.541 m AV Peak Grad:      27.6 mmHg AV Mean Grad:      13.0 mmHg LVOT Vmax:         181.00 cm/s LVOT Vmean:        120.000  cm/s LVOT VTI:          0.375 m LVOT/AV VTI ratio: 0.69 AI PHT:            549 msec  AORTA Ao Root diam: 2.90 cm Ao Asc diam:  3.40 cm MITRAL VALVE                TRICUSPID VALVE MV Area (PHT): 2.42 cm     TR Peak grad:   51.8 mmHg MV Area VTI:   2.19 cm     TR Vmax:        360.00 cm/s MV Peak grad:  21.5 mmHg MV Mean grad:  8.0 mmHg     SHUNTS MV Vmax:       2.32 m/s     Systemic VTI:  0.38 m MV Vmean:      126.0 cm/s   Systemic Diam: 2.00 cm MV Decel Time: 313 msec MV E velocity: 219.00 cm/s Rozann Lesches MD Electronically signed by Rozann Lesches MD Signature Date/Time: 01/26/2021/3:47:10 PM    Final      Scheduled Meds:  feeding supplement  237 mL Oral BID BM   furosemide  40 mg Intravenous Q12H   Continuous Infusions:   LOS: 1 day    Roxan Hockey M.D on 01/26/2021 at 7:11 PM  Go to [email or web address] - for contact info  [facility] - Office  [phone]  If 7PM-7AM, please contact night-coverage [email or web address] Password [email or web address] 01/26/2021, 7:11 PM

## 2021-01-27 ENCOUNTER — Ambulatory Visit: Payer: No Typology Code available for payment source | Admitting: Adult Health

## 2021-01-27 LAB — BASIC METABOLIC PANEL
BUN: 37 mg/dL — ABNORMAL HIGH (ref 8–23)
CO2: >45 mmol/L — ABNORMAL HIGH (ref 22–32)
Calcium: 8.6 mg/dL — ABNORMAL LOW (ref 8.9–10.3)
Chloride: 91 mmol/L — ABNORMAL LOW (ref 98–111)
Creatinine, Ser: 1.13 mg/dL (ref 0.61–1.24)
GFR, Estimated: >60 mL/min (ref >60)
Glucose, Bld: 94 mg/dL (ref 70–99)
Potassium: 5 mmol/L (ref 3.5–5.1)
Sodium: 142 mmol/L (ref 135–145)

## 2021-01-27 LAB — CBC
HCT: 35.9 % — ABNORMAL LOW (ref 39.0–52.0)
Hemoglobin: 10.1 g/dL — ABNORMAL LOW (ref 13.0–17.0)
MCH: 30.8 pg (ref 26.0–34.0)
MCHC: 28.1 g/dL — ABNORMAL LOW (ref 30.0–36.0)
MCV: 109.5 fL — ABNORMAL HIGH (ref 80.0–100.0)
Platelets: 149 10*3/uL — ABNORMAL LOW (ref 150–400)
RBC: 3.28 MIL/uL — ABNORMAL LOW (ref 4.22–5.81)
RDW: 15.7 % — ABNORMAL HIGH (ref 11.5–15.5)
WBC: 7.5 10*3/uL (ref 4.0–10.5)
nRBC: 0 % (ref 0.0–0.2)

## 2021-01-27 MED ORDER — IPRATROPIUM-ALBUTEROL 0.5-2.5 (3) MG/3ML IN SOLN
3.0000 mL | Freq: Once | RESPIRATORY_TRACT | Status: AC
  Administered 2021-01-27: 3 mL via RESPIRATORY_TRACT
  Filled 2021-01-27: qty 3

## 2021-01-27 MED ORDER — MELATONIN 3 MG PO TABS
6.0000 mg | ORAL_TABLET | Freq: Every day | ORAL | Status: DC
  Administered 2021-01-27 – 2021-01-30 (×4): 6 mg via ORAL
  Filled 2021-01-27 (×4): qty 2

## 2021-01-27 MED ORDER — METHYLPREDNISOLONE SODIUM SUCC 40 MG IJ SOLR
40.0000 mg | Freq: Two times a day (BID) | INTRAMUSCULAR | Status: DC
  Administered 2021-01-27 – 2021-01-31 (×8): 40 mg via INTRAVENOUS
  Filled 2021-01-27 (×8): qty 1

## 2021-01-27 NOTE — Progress Notes (Signed)
PROGRESS NOTE     Joshua [name], is a 75 y.o. male, DOB - [birth year], GEX:[id number]  Admit date - 01/25/2021   Admitting Physician Bernadette Hoit, DO  Outpatient Primary MD for the patient is [name], [name], MD  LOS - 2  Chief Complaint  Patient presents with   Shortness of Breath       Brief Narrative:  75 y.o. male with medical history significant for HFpEF, CAD s/p prior PCI to the LAD and RCA, hypertension, COPD on home oxygen (3L), persistent atrial fibrillation/flutter, recent GI bleed due to internal hemorrhoids (July 2022) and on Eliquis for CVA admitted on 01/25/2021 cute on chronic hypoxic respiratory failure secondary to CHF exacerbation  Assessment & Plan:   Principal Problem:   Acute on chronic diastolic (congestive) heart failure/Hypertrophic Cardiomyopathy Active Problems:   COPD (chronic obstructive pulmonary disease) ([place])   CAD in native artery   Atrial fibrillation, chronic (HCC)   Elevated brain natriuretic peptide (BNP) level   Macrocytic anemia   Hypoalbuminemia   Elevated d-dimer   Acute on chronic respiratory failure with hypoxia ([place])   1) acute on chronic  hypoxic respiratory failure secondary to CHF exacerbation -Echo with EF of 65 to 70% with hypertrophic cardiomyopathy -Continue IV Lasix, fluid input and output monitoring and daily weights -D-dimer elevated, VQ scan intermediate for PE clinically low index of suspicion for PE--- patient is already on Eliquis for atrial fibrillation -Continue Aldactone  2)H/o CAD--status post prior angioplasty and stent placement -Chest pain-free -continue Crestor  3) chronic atrial fibrillation----with slow ventricular response -PTA patient was on Tikosyn -c/n Eliquis  4) COPD--- - continue bronchodilators , add solumedrol iv   Disposition/Need for in-Hospital Stay- patient unable to be discharged at this time due to --- acute on chronic hypoxic respiratory failure requiring IV diuresis and  increased supplemental oxygen  Status is: Inpatient  Remains inpatient appropriate because: Disposition above  Disposition: The patient is from: Home              Anticipated d/c is to: Home              Anticipated d/c date is: 2 days              Patient currently is not medically stable to d/c. Barriers: Not Clinically Stable-   Code Status :  -  Code Status: Full Code   Family Communication:   (patient is alert, awake and coherent)  I called and updated daughter Carina  Consults  :  Na  DVT Prophylaxis  :   - SCDs   SCDs Start: 01/25/21 2331 apixaban (ELIQUIS) tablet 5 mg    Lab Results  Component Value Date   PLT 149 (L) 01/27/2021    Inpatient Medications  Scheduled Meds:  apixaban  5 mg Oral BID   feeding supplement  237 mL Oral BID BM   furosemide  40 mg Intravenous Q12H   melatonin  6 mg Oral QHS   pantoprazole  40 mg Oral Daily   spironolactone  25 mg Oral Daily   Continuous Infusions: PRN Meds:.acetaminophen, ipratropium-albuterol   Anti-infectives (From admission, onward)    None         Subjective: Joshua [name] today has no fevers, no emesis,  No chest pain,   -  No Nausea, Vomiting or Diarrhea Cough persist  -voiding  well  I called and updated daughter in [place]  Objective: Vitals:   01/27/21 0023 01/27/21 0500 01/27/21 0944 01/27/21 1351  BP: (!) 118/52 (!) 124/53  (!) 130/48  Pulse:  69  (!) 42  Resp:  (!) 24  18  Temp:  97.8 F (36.6 C)  98 F (36.7 C)  TempSrc:  Oral  Oral  SpO2:  93% 90% 100%  Weight:  79.2 kg    Height:        Intake/Output Summary (Last 24 hours) at 01/27/2021 2021 Last data filed at 01/27/2021 1900 Gross per 24 hour  Intake 1314 ml  Output 1450 ml  Net -136 ml   Filed Weights   01/25/21 2331 01/26/21 0323 01/27/21 0500  Weight: 81.6 kg 81.6 kg 79.2 kg    Physical Exam  Gen:- Awake Alert, no conversational dyspnea patient has dyspnea on exertion HEENT:- [place].AT, No sclera icterus Nose--  4L/min Neck-Supple Neck,No JVD,.  Lungs-diminished breath sounds bibasilar rales  CV- S1, S2 normal, irregular , 3/6 SM Abd-  +ve B.Sounds, Abd Soft, No tenderness,    Extremity/Skin:- +1 edema, pedal pulses present  Psych-affect is appropriate, oriented x3 Neuro-generalized weakness, no new focal deficits, no tremors  Data Reviewed: I have personally reviewed following labs and imaging studies  CBC: Recent Labs  Lab 01/25/21 1535 01/26/21 0525 01/27/21 0524  WBC 5.7 5.3 7.5  NEUTROABS 4.5  --   --   HGB 10.0* 9.7* 10.1*  HCT 35.7* 33.6* 35.9*  MCV 107.5* 107.3* 109.5*  PLT 126* 125* 098*   Basic Metabolic Panel: Recent Labs  Lab 01/25/21 1535 01/26/21 0525 01/27/21 0524  NA 141 143 142  K 4.4 4.1 5.0  CL 92* 92* 91*  CO2 41* >45* >45*  GLUCOSE 94 89 94  BUN 44* 39* 37*  CREATININE 1.13 1.01 1.13  CALCIUM 8.8* 8.5* 8.6*  MG  --  2.4  --   PHOS  --  3.4  --    GFR: Estimated Creatinine Clearance: 53.8 mL/min (by C-G formula based on SCr of 1.13 mg/dL). Liver Function Tests: Recent Labs  Lab 01/25/21 1500 01/26/21 0525  AST 23 20  ALT 14 13  ALKPHOS 55 48  BILITOT 2.2* 1.2  PROT 6.5 5.9*  ALBUMIN 3.1* 2.8*   No results for input(s): LIPASE, AMYLASE in the last 168 hours. No results for input(s): AMMONIA in the last 168 hours. Coagulation Profile: No results for input(s): INR, PROTIME in the last 168 hours. Cardiac Enzymes: No results for input(s): CKTOTAL, CKMB, CKMBINDEX, TROPONINI in the last 168 hours. BNP (last 3 results) No results for input(s): PROBNP in the last 8760 hours. HbA1C: No results for input(s): HGBA1C in the last 72 hours. CBG: No results for input(s): GLUCAP in the last 168 hours. Lipid Profile: No results for input(s): CHOL, HDL, LDLCALC, TRIG, CHOLHDL, LDLDIRECT in the last 72 hours. Thyroid Function Tests: No results for input(s): TSH, T4TOTAL, FREET4, T3FREE, THYROIDAB in the last 72 hours. Anemia Panel: Recent Labs     01/26/21 0525  VITAMINB12 751  FOLATE 8.5   Urine analysis:    Component Value Date/Time   COLORURINE YELLOW 01/14/2015 1112   APPEARANCEUR Clear 10/29/2019 0924   LABSPEC 1.019 01/14/2015 1112   PHURINE 5.0 01/14/2015 1112   GLUCOSEU Negative 10/29/2019 0924   HGBUR LARGE (A) 01/14/2015 1112   BILIRUBINUR Negative 10/29/2019 0924   KETONESUR NEGATIVE 01/14/2015 1112   PROTEINUR 2+ (A) 10/29/2019 0924   PROTEINUR 100 (A) 01/14/2015 1112   UROBILINOGEN negative 02/23/2015 1253   NITRITE Negative 10/29/2019 0924   NITRITE NEGATIVE 01/14/2015 1112   LEUKOCYTESUR  Negative 10/29/2019 0924   Sepsis Labs: @LABRCNTIP (procalcitonin:4,lacticidven:4)  ) Recent Results (from the past 240 hour(s))  Resp Panel by RT-PCR (Flu A&B, Covid) Nasopharyngeal Swab     Status: None   Collection Time: 01/25/21  5:20 PM   Specimen: Nasopharyngeal Swab; Nasopharyngeal(NP) swabs in vial transport medium  Result Value Ref Range Status   SARS Coronavirus 2 by RT PCR NEGATIVE NEGATIVE Final    Comment: (NOTE) SARS-CoV-2 target nucleic acids are NOT DETECTED.  The SARS-CoV-2 RNA is generally detectable in upper respiratory specimens during the acute phase of infection. The lowest concentration of SARS-CoV-2 viral copies this assay can detect is 138 copies/mL. A negative result does not preclude SARS-Cov-2 infection and should not be used as the sole basis for treatment or other patient management decisions. A negative result may occur with  improper specimen collection/handling, submission of specimen other than nasopharyngeal swab, presence of viral mutation(s) within the areas targeted by this assay, and inadequate number of viral copies(<138 copies/mL). A negative result must be combined with clinical observations, patient history, and epidemiological information. The expected result is Negative.  Fact Sheet for Patients:  EntrepreneurPulse.com.au  Fact Sheet for Healthcare  Providers:  IncredibleEmployment.be  This test is no t yet approved or cleared by the [place] FDA and  has been authorized for detection and/or diagnosis of SARS-CoV-2 by FDA under an Emergency Use Authorization (EUA). This EUA will remain  in effect (meaning this test can be used) for the duration of the COVID-19 declaration under Section 564(b)(1) of the Act, 21 U.S.C.section 360bbb-3(b)(1), unless the authorization is terminated  or revoked sooner.       Influenza A by PCR NEGATIVE NEGATIVE Final   Influenza B by PCR NEGATIVE NEGATIVE Final    Comment: (NOTE) The Xpert Xpress SARS-CoV-2/FLU/RSV plus assay is intended as an aid in the diagnosis of influenza from Nasopharyngeal swab specimens and should not be used as a sole basis for treatment. Nasal washings and aspirates are unacceptable for Xpert Xpress SARS-CoV-2/FLU/RSV testing.  Fact Sheet for Patients: EntrepreneurPulse.com.au  Fact Sheet for Healthcare Providers: IncredibleEmployment.be  This test is not yet approved or cleared by the [place] FDA and has been authorized for detection and/or diagnosis of SARS-CoV-2 by FDA under an Emergency Use Authorization (EUA). This EUA will remain in effect (meaning this test can be used) for the duration of the COVID-19 declaration under Section 564(b)(1) of the Act, 21 U.S.C. section 360bbb-3(b)(1), unless the authorization is terminated or revoked.  Performed at [facility], [address], [place], [place] [address]       Radiology Studies: NM Pulmonary Perfusion  Result Date: 01/26/2021 CLINICAL DATA:  Pulmonary emboli suspected. Low/intermediate probability. Abnormal D-dimer. EXAM: NUCLEAR MEDICINE PERFUSION LUNG SCAN TECHNIQUE: Perfusion images were obtained in multiple projections after intravenous injection of radiopharmaceutical. Ventilation scans intentionally deferred if perfusion scan and chest  x-ray adequate for interpretation during COVID 19 epidemic. RADIOPHARMACEUTICALS:  4.3 mCi Tc-48m MAA IV COMPARISON:  Chest radiography yesterday. FINDINGS: There are patchy subsegmental perfusion abnormalities in the mid and lower lungs bilaterally. Because of chest radiographic abnormalities in these regions, the study is of intermediate probability of pulmonary emboli. No perfusion abnormalities are seen in the upper lungs. IMPRESSION: Scattered subsegmental perfusion abnormalities in the mid and lower lungs bilaterally. There are chest radiographic abnormalities in these regions however, and therefore the findings are of intermediate probability of pulmonary emboli. Electronically Signed   By: Nelson Chimes M.D.   On: 01/26/2021 13:34  ECHOCARDIOGRAM COMPLETE  Result Date: 01/26/2021    ECHOCARDIOGRAM REPORT   Patient Name:   Joshua [name] Date of Exam: 01/26/2021 Medical Rec #:  [id number]     Height:       68.0 in Accession #:    [id number]    Weight:       179.9 lb Date of Birth:  01-16-1946      BSA:          1.954 m Patient Age:    89 years      BP:           119/52 mmHg Patient Gender: M             HR:           62` bpm. Exam Location:  [facility] Procedure: 2D Echo, Cardiac Doppler and Color Doppler Indications:    CHF  History:        Patient has prior history of Echocardiogram examinations, most                 recent 10/05/2020. CHF and Hypertrophic Cardiomyopathy, CAD,                 COPD, Arrythmias:Atrial Fibrillation; Risk Factors:Hypertension.  Sonographer:    Wenda Low Referring Phys: [id number] OLADAPO ADEFESO IMPRESSIONS  1. Left ventricular ejection fraction, by estimation, is 65 to 70%. The left ventricle has normal function. The left ventricle has no regional wall motion abnormalities. There is severe asymmetric left ventricular hypertrophy of the septal and basal segments (18 mm) consistent with known diagnosis of hypertrophic cardiomyopathy. No significant resting LVOT gradient based  on limited interrogation. Left ventricular diastolic parameters are indeterminate.  2. Right ventricular systolic function is mildly reduced. The right ventricular size is mildly enlarged. There is severely elevated pulmonary artery systolic pressure. The estimated right ventricular systolic pressure is 35.5 mmHg.  3. Left atrial size was moderately dilated.  4. Right atrial size was mildly dilated.  5. The mitral valve is abnormal. Mild to moderate mitral valve regurgitation. Moderate to severe mitral stenosis. The mean mitral valve gradient is 8.0 mmHg. Severe mitral annular calcification.  6. Tricuspid valve regurgitation is moderate to severe.  7. The aortic valve is tricuspid. There is mild calcification of the aortic valve. Aortic valve regurgitation is moderate. Mild aortic valve stenosis. Aortic regurgitation PHT measures 549 msec. Aortic valve mean gradient measures 13.0 mmHg.  8. Pulmonic valve regurgitation is moderate.  9. The inferior vena cava is dilated in size with <50% respiratory variability, suggesting right atrial pressure of 15 mmHg. Comparison(s): No significant change from prior study. Prior images reviewed side by side. FINDINGS  Left Ventricle: Left ventricular ejection fraction, by estimation, is 65 to 70%. The left ventricle has normal function. The left ventricle has no regional wall motion abnormalities. The left ventricular internal cavity size was normal in size. There is  severe asymmetric left ventricular hypertrophy of the septal and basal segments. Left ventricular diastolic parameters are indeterminate. Right Ventricle: The right ventricular size is mildly enlarged. No increase in right ventricular wall thickness. Right ventricular systolic function is mildly reduced. There is severely elevated pulmonary artery systolic pressure. The tricuspid regurgitant velocity is 3.60 m/s, and with an assumed right atrial pressure of 15 mmHg, the estimated right ventricular systolic pressure  is 73.2 mmHg. Left Atrium: Left atrial size was moderately dilated. Right Atrium: Right atrial size was mildly dilated. Pericardium: There is no evidence of pericardial effusion. Mitral  Valve: The mitral valve is abnormal. There is mild thickening of the mitral valve leaflet(s). There is moderate calcification of the mitral valve leaflet(s). Moderately decreased mobility of the mitral valve leaflets. Severe mitral annular calcification. Mild to moderate mitral valve regurgitation. Moderate to severe mitral valve stenosis. MV peak gradient, 21.5 mmHg. The mean mitral valve gradient is 8.0 mmHg. Tricuspid Valve: The tricuspid valve is grossly normal. Tricuspid valve regurgitation is moderate to severe. Aortic Valve: The aortic valve is tricuspid. There is mild calcification of the aortic valve. There is mild aortic valve annular calcification. Aortic valve regurgitation is moderate. Aortic regurgitation PHT measures 549 msec. Mild aortic stenosis is present. Aortic valve mean gradient measures 13.0 mmHg. Aortic valve peak gradient measures 27.6 mmHg. Aortic valve area, by VTI measures 2.18 cm. Pulmonic Valve: The pulmonic valve was grossly normal. Pulmonic valve regurgitation is moderate. Aorta: The aortic root is normal in size and structure. Venous: The inferior vena cava is dilated in size with less than 50% respiratory variability, suggesting right atrial pressure of 15 mmHg. IAS/Shunts: No atrial level shunt detected by color flow Doppler.  LEFT VENTRICLE PLAX 2D LVIDd:         4.00 cm   Diastology LVIDs:         2.50 cm   LV e' medial:    5.84 cm/s LV PW:         1.90 cm   LV E/e' medial:  37.5 LV IVS:        1.80 cm   LV e' lateral:   9.28 cm/s LVOT diam:     2.00 cm   LV E/e' lateral: 23.6 LV SV:         118 LV SV Index:   60 LVOT Area:     3.14 cm  RIGHT VENTRICLE RV Basal diam:  5.00 cm RV S prime:     9.89 cm/s LEFT ATRIUM            Index        RIGHT ATRIUM           Index LA diam:      4.80 cm  2.46  cm/m   RA Area:     24.50 cm LA Vol (A2C): 130.0 ml 66.54 ml/m  RA Volume:   71.80 ml  36.75 ml/m LA Vol (A4C): 74.7 ml  38.23 ml/m  AORTIC VALVE                     PULMONIC VALVE AV Area (Vmax):    2.17 cm      PV Vmax:       1.45 m/s AV Area (Vmean):   2.30 cm      PV Peak grad:  8.4 mmHg AV Area (VTI):     2.18 cm AV Vmax:           262.50 cm/s AV Vmean:          164.000 cm/s AV VTI:            0.541 m AV Peak Grad:      27.6 mmHg AV Mean Grad:      13.0 mmHg LVOT Vmax:         181.00 cm/s LVOT Vmean:        120.000 cm/s LVOT VTI:          0.375 m LVOT/AV VTI ratio: 0.69 AI PHT:            549  msec  AORTA Ao Root diam: 2.90 cm Ao Asc diam:  3.40 cm MITRAL VALVE                TRICUSPID VALVE MV Area (PHT): 2.42 cm     TR Peak grad:   51.8 mmHg MV Area VTI:   2.19 cm     TR Vmax:        360.00 cm/s MV Peak grad:  21.5 mmHg MV Mean grad:  8.0 mmHg     SHUNTS MV Vmax:       2.32 m/s     Systemic VTI:  0.38 m MV Vmean:      126.0 cm/s   Systemic Diam: 2.00 cm MV Decel Time: 313 msec MV E velocity: 219.00 cm/s Rozann Lesches MD Electronically signed by Rozann Lesches MD Signature Date/Time: 01/26/2021/3:47:10 PM    Final      Scheduled Meds:  apixaban  5 mg Oral BID   feeding supplement  237 mL Oral BID BM   furosemide  40 mg Intravenous Q12H   melatonin  6 mg Oral QHS   pantoprazole  40 mg Oral Daily   spironolactone  25 mg Oral Daily   Continuous Infusions:   LOS: 2 days   Roxan Hockey M.D on 01/27/2021 at 8:21 PM  Go to [email or web address] - for contact info  [facility] - Office  [phone]  If 7PM-7AM, please contact night-coverage [email or web address] Password [email or web address] 01/27/2021, 8:21 PM

## 2021-01-27 NOTE — Plan of Care (Signed)
  Problem: Clinical Measurements: Goal: Respiratory complications will improve Outcome: Not Progressing   Problem: Clinical Measurements: Goal: Cardiovascular complication will be avoided Outcome: Not Progressing   Problem: Nutrition: Goal: Adequate nutrition will be maintained Outcome: Not Progressing   Problem: Elimination: Goal: Will not experience complications related to urinary retention Outcome: Not Progressing   Problem: Safety: Goal: Ability to remain free from injury will improve Outcome: Not Progressing   Problem: Skin Integrity: Goal: Risk for impaired skin integrity will decrease Outcome: Not Progressing

## 2021-01-28 ENCOUNTER — Inpatient Hospital Stay (HOSPITAL_COMMUNITY): Payer: No Typology Code available for payment source

## 2021-01-28 DIAGNOSIS — J9621 Acute and chronic respiratory failure with hypoxia: Secondary | ICD-10-CM | POA: Diagnosis present

## 2021-01-28 DIAGNOSIS — J9622 Acute and chronic respiratory failure with hypercapnia: Secondary | ICD-10-CM

## 2021-01-28 LAB — BASIC METABOLIC PANEL
BUN: 37 mg/dL — ABNORMAL HIGH (ref 8–23)
CO2: >45 mmol/L — ABNORMAL HIGH (ref 22–32)
Calcium: 8.5 mg/dL — ABNORMAL LOW (ref 8.9–10.3)
Chloride: 89 mmol/L — ABNORMAL LOW (ref 98–111)
Creatinine, Ser: 1.13 mg/dL (ref 0.61–1.24)
GFR, Estimated: >60 mL/min (ref >60)
Glucose, Bld: 138 mg/dL — ABNORMAL HIGH (ref 70–99)
Potassium: 4.2 mmol/L (ref 3.5–5.1)
Sodium: 140 mmol/L (ref 135–145)

## 2021-01-28 LAB — BLOOD GAS, ARTERIAL
Acid-Base Excess: 20.8 mmol/L — ABNORMAL HIGH (ref 0.0–2.0)
Bicarbonate: 42.6 mmol/L — ABNORMAL HIGH (ref 20.0–28.0)
Drawn by: 22179
FIO2: 44
O2 Saturation: 94.4 %
Patient temperature: 36.6
pCO2 arterial: 101 mmHg (ref 32.0–48.0)
pH, Arterial: 7.299 — ABNORMAL LOW (ref 7.350–7.450)
pO2, Arterial: 79.6 mmHg — ABNORMAL LOW (ref 83.0–108.0)

## 2021-01-28 MED ORDER — CHLORHEXIDINE GLUCONATE CLOTH 2 % EX PADS
6.0000 | MEDICATED_PAD | Freq: Every day | CUTANEOUS | Status: DC
  Administered 2021-01-28 – 2021-01-30 (×3): 6 via TOPICAL

## 2021-01-28 MED ORDER — FUROSEMIDE 10 MG/ML IJ SOLN
60.0000 mg | Freq: Two times a day (BID) | INTRAMUSCULAR | Status: DC
  Administered 2021-01-28 – 2021-01-31 (×6): 60 mg via INTRAVENOUS
  Filled 2021-01-28 (×6): qty 6

## 2021-01-28 MED ORDER — METOLAZONE 5 MG PO TABS
5.0000 mg | ORAL_TABLET | Freq: Once | ORAL | Status: AC
Start: 2021-01-28 — End: 2021-01-28
  Administered 2021-01-28: 5 mg via ORAL
  Filled 2021-01-28: qty 1

## 2021-01-28 MED ORDER — GUAIFENESIN ER 600 MG PO TB12
600.0000 mg | ORAL_TABLET | Freq: Two times a day (BID) | ORAL | Status: DC
  Administered 2021-01-28 – 2021-01-31 (×6): 600 mg via ORAL
  Filled 2021-01-28 (×6): qty 1

## 2021-01-28 MED ORDER — DOXYCYCLINE HYCLATE 100 MG PO TABS
100.0000 mg | ORAL_TABLET | Freq: Two times a day (BID) | ORAL | Status: DC
  Administered 2021-01-29 – 2021-01-31 (×5): 100 mg via ORAL
  Filled 2021-01-28 (×5): qty 1

## 2021-01-28 NOTE — Plan of Care (Signed)

## 2021-01-28 NOTE — Progress Notes (Addendum)
PROGRESS NOTE     Joshua [name], is a 75 y.o. male, DOB - 1945-09-14, SWN:[id number]  Admit date - 01/25/2021   Admitting Physician Joshua Hoit, DO  Outpatient Primary MD for the patient is [name], [name], MD  LOS - 3  Chief Complaint  Patient presents with   Shortness of Breath       Brief Narrative:  75 y.o. male with medical history significant for HFpEF, CAD s/p prior PCI to the LAD and RCA, hypertension, COPD on home oxygen (3L), persistent atrial fibrillation/flutter, recent GI bleed due to internal hemorrhoids (July 2022) and on Eliquis for CVA admitted on 01/25/2021 cute on chronic hypoxic respiratory failure secondary to CHF exacerbation  Assessment & Plan:   Principal Problem:   Acute on chronic diastolic (congestive) heart failure/Hypertrophic Cardiomyopathy Active Problems:   COPD (chronic obstructive pulmonary disease) ([place])   CAD in native artery   Atrial fibrillation, chronic (HCC)   Elevated brain natriuretic peptide (BNP) level   Macrocytic anemia   Hypoalbuminemia   Elevated d-dimer   Acute on chronic respiratory failure with hypoxia ([place])    1)Acute on chronic Hypoxic respiratory failure secondary to CHF exacerbation -Echo with EF of 65 to 70% with hypertrophic cardiomyopathy Wt 179 on 01/25/21 , Now 177 lb -Increase IV Lasix to 60 mg every 12 from 40 mg every 12 -Add metolazone 5 mg x 1 dose -Continue to monitor fluid input and output monitoring and daily weights -D-dimer elevated, VQ scan intermediate for PE clinically low index of suspicion for PE--- patient is already on Eliquis for atrial fibrillation -Continue Aldactone -Patient with persistent metabolic alkalosis--??  If this is compensatory for respiratory acidosis--- respiratory therapist to do ABG  2)H/o CAD--status post prior angioplasty and stent placement -Chest pain-free -continue Crestor, not on aspirin due to Eliquis use  3)Chronic Atrial Fibrillation----stable -PTA patient  was on Tikosyn -c/n Eliquis  4)Acute COPD exacerbation--- - continue bronchodilators, doxycycline and IV Solu-Medrol  Disposition/Need for in-Hospital Stay- patient unable to be discharged at this time due to --- acute on chronic hypoxic respiratory failure due to CHF requiring IV diuresis and increased supplemental oxygen, also requiring IV Solu-Medrol for COPD exacerbation  Status is: Inpatient  Remains inpatient appropriate because: Disposition above  Disposition: The patient is from: Home              Anticipated d/c is to: Home              Anticipated d/c date is: 2 days              Patient currently is not medically stable to d/c. Barriers: Not Clinically Stable-   Code Status :  -  Code Status: Full Code   Family Communication:   (patient is alert, awake and coherent)  I called and updated daughter Joshua [name]  Consults  :  Na  DVT Prophylaxis  :   - SCDs   SCDs Start: 01/25/21 2331 apixaban (ELIQUIS) tablet 5 mg    Lab Results  Component Value Date   PLT 149 (L) 01/27/2021    Inpatient Medications  Scheduled Meds:  apixaban  5 mg Oral BID   feeding supplement  237 mL Oral BID BM   furosemide  60 mg Intravenous BID   melatonin  6 mg Oral QHS   methylPREDNISolone (SOLU-MEDROL) injection  40 mg Intravenous Q12H   metolazone  5 mg Oral Once   pantoprazole  40 mg Oral Daily   spironolactone  25 mg  Oral Daily   Continuous Infusions: PRN Meds:.acetaminophen, ipratropium-albuterol   Anti-infectives (From admission, onward)    None         Subjective: Joshua [name] today has no fevers, no emesis,  No chest pain,   -  --Shortness of breath even at rest  -Cough and wheezing persist  Objective: Vitals:   01/27/21 1351 01/27/21 2050 01/28/21 0426 01/28/21 1346  BP: (!) 130/48 128/60 124/65 (!) 152/39  Pulse: (!) 42 (!) 54 (!) 51 71  Resp: 18 20 18 16   Temp: 98 F (36.7 C) 97.7 F (36.5 C) 97.8 F (36.6 C) 97.9 F (36.6 C)  TempSrc: Oral Oral Oral    SpO2: 100% 99% 98% 94%  Weight:   80.3 kg   Height:        Intake/Output Summary (Last 24 hours) at 01/28/2021 1706 Last data filed at 01/28/2021 0900 Gross per 24 hour  Intake 600 ml  Output 1400 ml  Net -800 ml   Filed Weights   01/26/21 0323 01/27/21 0500 01/28/21 0426  Weight: 81.6 kg 79.2 kg 80.3 kg    Physical Exam  Gen:- Awake Alert, no conversational dyspnea  HEENT:- [place].AT, No sclera icterus Nose-- 4L/min Neck-Supple Neck,No JVD,.  Lungs-diminished breath sounds bibasilar rales, actually has few scattered wheezes in upper lung fields CV- S1, S2 normal, irregular , 3/6 SM Abd-  +ve B.Sounds, Abd Soft, No tenderness,    Extremity/Skin:- +1 edema, pedal pulses present  Psych-affect is appropriate, oriented x3 Neuro-generalized weakness, no new focal deficits, no tremors  Data Reviewed: I have personally reviewed following labs and imaging studies  CBC: Recent Labs  Lab 01/25/21 1535 01/26/21 0525 01/27/21 0524  WBC 5.7 5.3 7.5  NEUTROABS 4.5  --   --   HGB 10.0* 9.7* 10.1*  HCT 35.7* 33.6* 35.9*  MCV 107.5* 107.3* 109.5*  PLT 126* 125* 462*   Basic Metabolic Panel: Recent Labs  Lab 01/25/21 1535 01/26/21 0525 01/27/21 0524 01/28/21 0444  NA 141 143 142 140  K 4.4 4.1 5.0 4.2  CL 92* 92* 91* 89*  CO2 41* >45* >45* >45*  GLUCOSE 94 89 94 138*  BUN 44* 39* 37* 37*  CREATININE 1.13 1.01 1.13 1.13  CALCIUM 8.8* 8.5* 8.6* 8.5*  MG  --  2.4  --   --   PHOS  --  3.4  --   --    GFR: Estimated Creatinine Clearance: 53.8 mL/min (by C-G formula based on SCr of 1.13 mg/dL). Liver Function Tests: Recent Labs  Lab 01/25/21 1500 01/26/21 0525  AST 23 20  ALT 14 13  ALKPHOS 55 48  BILITOT 2.2* 1.2  PROT 6.5 5.9*  ALBUMIN 3.1* 2.8*   No results for input(s): LIPASE, AMYLASE in the last 168 hours. No results for input(s): AMMONIA in the last 168 hours. Coagulation Profile: No results for input(s): INR, PROTIME in the last 168 hours. Cardiac  Enzymes: No results for input(s): CKTOTAL, CKMB, CKMBINDEX, TROPONINI in the last 168 hours. BNP (last 3 results) No results for input(s): PROBNP in the last 8760 hours. HbA1C: No results for input(s): HGBA1C in the last 72 hours. CBG: No results for input(s): GLUCAP in the last 168 hours. Lipid Profile: No results for input(s): CHOL, HDL, LDLCALC, TRIG, CHOLHDL, LDLDIRECT in the last 72 hours. Thyroid Function Tests: No results for input(s): TSH, T4TOTAL, FREET4, T3FREE, THYROIDAB in the last 72 hours. Anemia Panel: Recent Labs    01/26/21 0525  [id number]  HBZJIR  8.5   Urine analysis:    Component Value Date/Time   COLORURINE YELLOW 01/14/2015 1112   APPEARANCEUR Clear 10/29/2019 0924   LABSPEC 1.019 01/14/2015 1112   PHURINE 5.0 01/14/2015 1112   GLUCOSEU Negative 10/29/2019 0924   HGBUR LARGE (A) 01/14/2015 1112   BILIRUBINUR Negative 10/29/2019 0924   KETONESUR NEGATIVE 01/14/2015 1112   PROTEINUR 2+ (A) 10/29/2019 0924   PROTEINUR 100 (A) 01/14/2015 1112   UROBILINOGEN negative 02/23/2015 1253   NITRITE Negative 10/29/2019 0924   NITRITE NEGATIVE 01/14/2015 1112   LEUKOCYTESUR Negative 10/29/2019 0924   Sepsis Labs: @LABRCNTIP (procalcitonin:4,lacticidven:4)  ) Recent Results (from the past 240 hour(s))  Resp Panel by RT-PCR (Flu A&B, Covid) Nasopharyngeal Swab     Status: None   Collection Time: 01/25/21  5:20 PM   Specimen: Nasopharyngeal Swab; Nasopharyngeal(NP) swabs in vial transport medium  Result Value Ref Range Status   SARS Coronavirus 2 by RT PCR NEGATIVE NEGATIVE Final    Comment: (NOTE) SARS-CoV-2 target nucleic acids are NOT DETECTED.  The SARS-CoV-2 RNA is generally detectable in upper respiratory specimens during the acute phase of infection. The lowest concentration of SARS-CoV-2 viral copies this assay can detect is 138 copies/mL. A negative result does not preclude SARS-Cov-2 infection and should not be used as the sole basis for  treatment or other patient management decisions. A negative result may occur with  improper specimen collection/handling, submission of specimen other than nasopharyngeal swab, presence of viral mutation(s) within the areas targeted by this assay, and inadequate number of viral copies(<138 copies/mL). A negative result must be combined with clinical observations, patient history, and epidemiological information. The expected result is Negative.  Fact Sheet for Patients:  EntrepreneurPulse.com.au  Fact Sheet for Healthcare Providers:  IncredibleEmployment.be  This test is no t yet approved or cleared by the [place] FDA and  has been authorized for detection and/or diagnosis of SARS-CoV-2 by FDA under an Emergency Use Authorization (EUA). This EUA will remain  in effect (meaning this test can be used) for the duration of the COVID-19 declaration under Section 564(b)(1) of the Act, 21 U.S.C.section 360bbb-3(b)(1), unless the authorization is terminated  or revoked sooner.       Influenza A by PCR NEGATIVE NEGATIVE Final   Influenza B by PCR NEGATIVE NEGATIVE Final    Comment: (NOTE) The Xpert Xpress SARS-CoV-2/FLU/RSV plus assay is intended as an aid in the diagnosis of influenza from Nasopharyngeal swab specimens and should not be used as a sole basis for treatment. Nasal washings and aspirates are unacceptable for Xpert Xpress SARS-CoV-2/FLU/RSV testing.  Fact Sheet for Patients: EntrepreneurPulse.com.au  Fact Sheet for Healthcare Providers: IncredibleEmployment.be  This test is not yet approved or cleared by the [place] FDA and has been authorized for detection and/or diagnosis of SARS-CoV-2 by FDA under an Emergency Use Authorization (EUA). This EUA will remain in effect (meaning this test can be used) for the duration of the COVID-19 declaration under Section 564(b)(1) of the Act, 21  U.S.C. section 360bbb-3(b)(1), unless the authorization is terminated or revoked.  Performed at [facility], [address], [place], [place] [address]       Radiology Studies: DG Chest 2 View  Result Date: 01/28/2021 CLINICAL DATA:  Shortness of breath. EXAM: CHEST - 2 VIEW COMPARISON:  Chest x-rays dated 01/25/2021 and 12/10/2020. FINDINGS: Stable cardiomegaly. Patchy bilateral interstitial airspace opacities are stable. Small bilateral pleural effusions versus chronic pleural thickening at the costophrenic angles. No pneumothorax is seen. IMPRESSION: Stable chest  x-ray. Patchy bilateral interstitial opacities, most likely CHF/volume overload superimposed on chronic interstitial lung disease. Electronically Signed   By: Franki Cabot M.D.   On: 01/28/2021 11:35     Scheduled Meds:  apixaban  5 mg Oral BID   feeding supplement  237 mL Oral BID BM   furosemide  60 mg Intravenous BID   melatonin  6 mg Oral QHS   methylPREDNISolone (SOLU-MEDROL) injection  40 mg Intravenous Q12H   metolazone  5 mg Oral Once   pantoprazole  40 mg Oral Daily   spironolactone  25 mg Oral Daily   Continuous Infusions:   LOS: 3 days   Roxan Hockey M.D on 01/28/2021 at 5:06 PM  Go to [email or web address] - for contact info  [facility] - Office  [phone]  If 7PM-7AM, please contact night-coverage [email or web address] Password [email or web address] 01/28/2021, 5:06 PM

## 2021-01-28 NOTE — Progress Notes (Addendum)
Patient in a fib by Monitor. Hr 67 to 45 up and down rate. Patient appear alert and speaking . Pleasantly. Chronic high PCO2.

## 2021-01-28 NOTE — Progress Notes (Signed)
  Resp acidosis with PCO2 of 101, PH 7.29 , Po2 79 --- --will transfer to [facility] for Bipap  RT at bedside I called and updated daughter Ms [name] at [phone]  Roxan Hockey, MD

## 2021-01-29 DIAGNOSIS — E8729 Other acidosis: Secondary | ICD-10-CM | POA: Diagnosis present

## 2021-01-29 LAB — MRSA NEXT GEN BY PCR, NASAL: MRSA by PCR Next Gen: DETECTED — AB

## 2021-01-29 LAB — BLOOD GAS, ARTERIAL
Acid-Base Excess: 24.1 mmol/L — ABNORMAL HIGH (ref 0.0–2.0)
Bicarbonate: 47.1 mmol/L — ABNORMAL HIGH (ref 20.0–28.0)
FIO2: 32
O2 Saturation: 97.4 %
Patient temperature: 37
pCO2 arterial: 87.7 mmHg (ref 32.0–48.0)
pH, Arterial: 7.382 (ref 7.350–7.450)
pO2, Arterial: 98 mmHg (ref 83.0–108.0)

## 2021-01-29 MED ORDER — MUPIROCIN 2 % EX OINT
1.0000 "application " | TOPICAL_OINTMENT | Freq: Two times a day (BID) | CUTANEOUS | Status: DC
  Administered 2021-01-29 – 2021-01-31 (×5): 1 via NASAL
  Filled 2021-01-29: qty 22

## 2021-01-29 NOTE — Progress Notes (Signed)
Patient most likely at base line PCO2 80's , Patient off BiPAP watching tv

## 2021-01-29 NOTE — Clinical Social Work Note (Signed)
[place] notification done [id number]

## 2021-01-29 NOTE — TOC Progression Note (Addendum)
Transition of Care [facility]) - Progression Note    Patient Details  Name: Joshua [name] MRN: [id number] Date of Birth: 1945/07/31  [facility]) CM/SW Contact  Natasha Bence, LCSW Phone Number: 01/29/2021, 1:50 PM  Clinical Narrative:    CSW notified of patients NIV needs. CSW referred patient to [place] with [employer]. Caryl Pina agreeable to provide NIV. TOC to follow.    Expected Discharge Plan: Home/Self Care Barriers to Discharge: Continued Medical Work up  Expected Discharge Plan and Services Expected Discharge Plan: Home/Self Care In-house Referral: Clinical Social Work Discharge Planning Services: CM Consult   Living arrangements for the past 2 months: Single Family Home                                       Social Determinants of Health (SDOH) Interventions    Readmission Risk Interventions Readmission Risk Prevention Plan 01/26/2021  Transportation Screening Complete  HRI or Home Care Consult Complete  Social Work Consult for [place] Planning/Counseling Complete  Palliative Care Screening Not Applicable  Medication Review [employer]) Complete  Some recent data might be hidden

## 2021-01-29 NOTE — Progress Notes (Signed)
  NIV/Trilogy Requirement Statement:-  Joshua [name]   has severe/advanced COPD with chronic hypoxic and hypercapnic respiratory failure.  Despite aggressive treatment , patient continues to exhibit signs of significant hypercapnia associated with chronic respiratory failure secondary to severe/advanced COPD.  Patient requires the use of NIV both nightly and daytime to help with exacerbation periods.    - The use of the NIV will treat patient's high PCO2 levels (please see ABG results on Bipap), and use of NIV can reduce risk of exacerbation in future hospitalizations when used at night and during the day.   Patient will need these advanced settings in conjunction with the current medication regimen and aggressive pulmonary treatment: BiPAP is not an option due to his functional limitations and the severity of the patient's condition.    Failure to have NIV available for use could lead to death.   patient had significant episodes of severe lethargy and unresponsiveness due to very very high CO2 levels    ABG on  date 01/28/21 and 01/29/21 as below --Patient had uncompensated respiratory acidosis requiring prolonged BiPAP use -PCO2 of 101, with BiPAP use PCO2 down to 87.7 PH 7.29 , with BiPAP use pH is up to 7.38 Po2 79 --- with BiPAP use PO2 is up to 98.0    Roxan Hockey, MD

## 2021-01-29 NOTE — Progress Notes (Signed)
PROGRESS NOTE     Joshua [name], is a 75 y.o. male, DOB - 1946/01/28, QIW:[id number]  Admit date - 01/25/2021   Admitting Physician Bernadette Hoit, DO  Outpatient Primary MD for the patient is [name], [name], MD  LOS - 4  Chief Complaint  Patient presents with   Shortness of Breath       Brief Narrative:  75 y.o. male with medical history significant for HFpEF, CAD s/p prior PCI to the LAD and RCA, hypertension, COPD on home oxygen (3L), persistent atrial fibrillation/flutter, recent GI bleed due to internal hemorrhoids (July 2022) and on Eliquis for CVA admitted on 01/25/2021 cute on chronic hypoxic respiratory failure secondary to CHF exacerbation  Assessment & Plan:   Principal Problem:   Acute on chronic respiratory failure with hypoxia and hypercapnia (HCC) Active Problems:   Acute on chronic diastolic (congestive) heart failure/Hypertrophic Cardiomyopathy   COPD (chronic obstructive pulmonary disease) (HCC)   CAD in native artery   Atrial fibrillation, chronic (HCC)   Elevated brain natriuretic peptide (BNP) level   Macrocytic anemia   Hypoalbuminemia   Elevated d-dimer    1)Acute on chronic Hypoxic and hypercapnic respiratory failure secondary to acute COPD exacerbation -Patient had uncompensated respiratory acidosis requiring prolonged BiPAP use -PCO2 of 101, with BiPAP use PCO2 down to 87.7 PH 7.29 , with BiPAP use pH is up to 7.38 Po2 79 --- with BiPAP use PO2 is up to 98.0 -Continue Solu-Medrol, bronchodilators mucolytics and doxycycline  2)HFpEF--- -Echo with EF of 65 to 70% with hypertrophic cardiomyopathy Wt 179 on 01/25/21 , Now 177 lb -Increased IV Lasix to 60 mg every 12 from 40 mg every 12 -Continue to monitor fluid input and output monitoring and daily weights -Continue Aldactone  Intake/Output Summary (Last 24 hours) at 01/29/2021 1643 Last data filed at 01/29/2021 1035 Gross per 24 hour  Intake 240 ml  Output 2400 ml  Net -2160 ml    3)-D-dimer elevated, VQ scan intermediate for PE clinically low index of suspicion for PE--- patient is already on Eliquis for atrial fibrillation - 4)H/o CAD--status post prior angioplasty and stent placement -Chest pain-free -continue Crestor, not on aspirin due to Eliquis use  5)Chronic Atrial Fibrillation----stable -PTA patient was on Tikosyn -c/n Eliquis  6)Social/Ethics--- advanced directives and plan of care discussed with patient and patient's daughter, patient remains a full code  Disposition/Need for in-Hospital Stay- patient unable to be discharged at this time due to --- acute on chronic hypoxic respiratory failure due to CHF requiring IV diuresis and increased supplemental oxygen, also requiring IV bronchodilators mucolytics for COPD exacerbation  Status is: Inpatient  Remains inpatient appropriate because: Disposition above  Disposition: The patient is from: Home              Anticipated d/c is to: Home              Anticipated d/c date is: 2 days              Patient currently is not medically stable to d/c. Barriers: Not Clinically Stable-   Code Status :  -  Code Status: Full Code   Family Communication:   (patient is alert, awake and coherent)  updated daughter Ms [name] at [phone]  Consults  :  Na  DVT Prophylaxis  :   - SCDs   SCDs Start: 01/25/21 2331 apixaban (ELIQUIS) tablet 5 mg    Lab Results  Component Value Date   PLT 149 (L) 01/27/2021  Inpatient Medications  Scheduled Meds:  apixaban  5 mg Oral BID   Chlorhexidine Gluconate Cloth  6 each Topical Daily   doxycycline  100 mg Oral Q12H   feeding supplement  237 mL Oral BID BM   furosemide  60 mg Intravenous BID   guaiFENesin  600 mg Oral BID   melatonin  6 mg Oral QHS   methylPREDNISolone (SOLU-MEDROL) injection  40 mg Intravenous Q12H   mupirocin ointment  1 application Nasal BID   pantoprazole  40 mg Oral Daily   spironolactone  25 mg Oral Daily   Continuous  Infusions: PRN Meds:.acetaminophen, ipratropium-albuterol   Anti-infectives (From admission, onward)    Start     Dose/Rate Route Frequency Ordered Stop   01/28/21 2200  doxycycline (VIBRA-TABS) tablet 100 mg        100 mg Oral Every 12 hours 01/28/21 1710           Subjective: Joshua [name] today has no fevers, no emesis,  No chest pain,   - -More awake more alert after prolonged BiPAP use -Voiding well  Objective: Vitals:   01/29/21 1400 01/29/21 1500 01/29/21 1600 01/29/21 1614  BP: (!) 137/47 (!) 121/42 (!) 123/30   Pulse: (!) 46 76 72 73  Resp: (!) 24 (!) 33 (!) 23 19  Temp:    97.9 F (36.6 C)  TempSrc:    Oral  SpO2: 92% 98% 99% 100%  Weight:      Height:        Intake/Output Summary (Last 24 hours) at 01/29/2021 1631 Last data filed at 01/29/2021 1035 Gross per 24 hour  Intake 240 ml  Output 2400 ml  Net -2160 ml   Filed Weights   01/28/21 0426 01/28/21 1900 01/29/21 0500  Weight: 80.3 kg 81.8 kg 80.8 kg    Physical Exam  Gen:- Awake Alert, no conversational dyspnea  HEENT:- [place].AT, No sclera icterus Nose-- 4L/min , alternate with Bipap Mask Neck-Supple Neck,No JVD,.  Lungs-diminished breath sounds bibasilar rales, actually has few scattered wheezes in upper lung fields CV- S1, S2 normal, irregular , 3/6 SM Abd-  +ve B.Sounds, Abd Soft, No tenderness,    Extremity/Skin:- +1 edema, pedal pulses present  Psych-affect is appropriate, oriented x3 Neuro-generalized weakness, no new focal deficits, no tremors  Data Reviewed: I have personally reviewed following labs and imaging studies  CBC: Recent Labs  Lab 01/25/21 1535 01/26/21 0525 01/27/21 0524  WBC 5.7 5.3 7.5  NEUTROABS 4.5  --   --   HGB 10.0* 9.7* 10.1*  HCT 35.7* 33.6* 35.9*  MCV 107.5* 107.3* 109.5*  PLT 126* 125* 161*   Basic Metabolic Panel: Recent Labs  Lab 01/25/21 1535 01/26/21 0525 01/27/21 0524 01/28/21 0444  NA 141 143 142 140  K 4.4 4.1 5.0 4.2  CL 92* 92* 91* 89*   CO2 41* >45* >45* >45*  GLUCOSE 94 89 94 138*  BUN 44* 39* 37* 37*  CREATININE 1.13 1.01 1.13 1.13  CALCIUM 8.8* 8.5* 8.6* 8.5*  MG  --  2.4  --   --   PHOS  --  3.4  --   --    GFR: Estimated Creatinine Clearance: 53.8 mL/min (by C-G formula based on SCr of 1.13 mg/dL). Liver Function Tests: Recent Labs  Lab 01/25/21 1500 01/26/21 0525  AST 23 20  ALT 14 13  ALKPHOS 55 48  BILITOT 2.2* 1.2  PROT 6.5 5.9*  ALBUMIN 3.1* 2.8*   No results for input(s): LIPASE,  AMYLASE in the last 168 hours. No results for input(s): AMMONIA in the last 168 hours. Coagulation Profile: No results for input(s): INR, PROTIME in the last 168 hours. Cardiac Enzymes: No results for input(s): CKTOTAL, CKMB, CKMBINDEX, TROPONINI in the last 168 hours. BNP (last 3 results) No results for input(s): PROBNP in the last 8760 hours. HbA1C: No results for input(s): HGBA1C in the last 72 hours. CBG: No results for input(s): GLUCAP in the last 168 hours. Lipid Profile: No results for input(s): CHOL, HDL, LDLCALC, TRIG, CHOLHDL, LDLDIRECT in the last 72 hours. Thyroid Function Tests: No results for input(s): TSH, T4TOTAL, FREET4, T3FREE, THYROIDAB in the last 72 hours. Anemia Panel: No results for input(s): VITAMINB12, FOLATE, FERRITIN, TIBC, IRON, RETICCTPCT in the last 72 hours.  Urine analysis:    Component Value Date/Time   COLORURINE YELLOW 01/14/2015 1112   APPEARANCEUR Clear 10/29/2019 0924   LABSPEC 1.019 01/14/2015 1112   PHURINE 5.0 01/14/2015 1112   GLUCOSEU Negative 10/29/2019 0924   HGBUR LARGE (A) 01/14/2015 1112   BILIRUBINUR Negative 10/29/2019 0924   KETONESUR NEGATIVE 01/14/2015 1112   PROTEINUR 2+ (A) 10/29/2019 0924   PROTEINUR 100 (A) 01/14/2015 1112   UROBILINOGEN negative 02/23/2015 1253   NITRITE Negative 10/29/2019 0924   NITRITE NEGATIVE 01/14/2015 1112   LEUKOCYTESUR Negative 10/29/2019 0924   Sepsis Labs: @LABRCNTIP (procalcitonin:4,lacticidven:4)  ) Recent  Results (from the past 240 hour(s))  Resp Panel by RT-PCR (Flu A&B, Covid) Nasopharyngeal Swab     Status: None   Collection Time: 01/25/21  5:20 PM   Specimen: Nasopharyngeal Swab; Nasopharyngeal(NP) swabs in vial transport medium  Result Value Ref Range Status   SARS Coronavirus 2 by RT PCR NEGATIVE NEGATIVE Final    Comment: (NOTE) SARS-CoV-2 target nucleic acids are NOT DETECTED.  The SARS-CoV-2 RNA is generally detectable in upper respiratory specimens during the acute phase of infection. The lowest concentration of SARS-CoV-2 viral copies this assay can detect is 138 copies/mL. A negative result does not preclude SARS-Cov-2 infection and should not be used as the sole basis for treatment or other patient management decisions. A negative result may occur with  improper specimen collection/handling, submission of specimen other than nasopharyngeal swab, presence of viral mutation(s) within the areas targeted by this assay, and inadequate number of viral copies(<138 copies/mL). A negative result must be combined with clinical observations, patient history, and epidemiological information. The expected result is Negative.  Fact Sheet for Patients:  EntrepreneurPulse.com.au  Fact Sheet for Healthcare Providers:  IncredibleEmployment.be  This test is no t yet approved or cleared by the [place] FDA and  has been authorized for detection and/or diagnosis of SARS-CoV-2 by FDA under an Emergency Use Authorization (EUA). This EUA will remain  in effect (meaning this test can be used) for the duration of the COVID-19 declaration under Section 564(b)(1) of the Act, 21 U.S.C.section 360bbb-3(b)(1), unless the authorization is terminated  or revoked sooner.       Influenza A by PCR NEGATIVE NEGATIVE Final   Influenza B by PCR NEGATIVE NEGATIVE Final    Comment: (NOTE) The Xpert Xpress SARS-CoV-2/FLU/RSV plus assay is intended as an aid in the  diagnosis of influenza from Nasopharyngeal swab specimens and should not be used as a sole basis for treatment. Nasal washings and aspirates are unacceptable for Xpert Xpress SARS-CoV-2/FLU/RSV testing.  Fact Sheet for Patients: EntrepreneurPulse.com.au  Fact Sheet for Healthcare Providers: IncredibleEmployment.be  This test is not yet approved or cleared by the [place] and  has been authorized for detection and/or diagnosis of SARS-CoV-2 by FDA under an Emergency Use Authorization (EUA). This EUA will remain in effect (meaning this test can be used) for the duration of the COVID-19 declaration under Section 564(b)(1) of the Act, 21 U.S.C. section 360bbb-3(b)(1), unless the authorization is terminated or revoked.  Performed at [facility], [address] [place], [place] [address]   MRSA Next Gen by PCR, Nasal     Status: Abnormal   Collection Time: 01/28/21  7:21 PM   Specimen: Nasal Mucosa; Nasal Swab  Result Value Ref Range Status   MRSA by PCR Next Gen DETECTED (A) NOT DETECTED Final    Comment: CRITICAL RESULT CALLED TO, READ BACK BY AND VERIFIED WITH: ERTEL,A 0027 01/29/2021 COLEMAN,R (NOTE) The GeneXpert MRSA Assay (FDA approved for NASAL specimens only), is one component of a comprehensive MRSA colonization surveillance program. It is not intended to diagnose MRSA infection nor to guide or monitor treatment for MRSA infections. Test performance is not FDA approved in patients less than 70 years old. Performed at [facility], [address], [place], [place] [address]      Radiology Studies: DG Chest 2 View  Result Date: 01/28/2021 CLINICAL DATA:  Shortness of breath. EXAM: CHEST - 2 VIEW COMPARISON:  Chest x-rays dated 01/25/2021 and 12/10/2020. FINDINGS: Stable cardiomegaly. Patchy bilateral interstitial airspace opacities are stable. Small bilateral pleural effusions versus chronic pleural thickening at the costophrenic  angles. No pneumothorax is seen. IMPRESSION: Stable chest x-ray. Patchy bilateral interstitial opacities, most likely CHF/volume overload superimposed on chronic interstitial lung disease. Electronically Signed   By: Franki Cabot M.D.   On: 01/28/2021 11:35     Scheduled Meds:  apixaban  5 mg Oral BID   Chlorhexidine Gluconate Cloth  6 each Topical Daily   doxycycline  100 mg Oral Q12H   feeding supplement  237 mL Oral BID BM   furosemide  60 mg Intravenous BID   guaiFENesin  600 mg Oral BID   melatonin  6 mg Oral QHS   methylPREDNISolone (SOLU-MEDROL) injection  40 mg Intravenous Q12H   mupirocin ointment  1 application Nasal BID   pantoprazole  40 mg Oral Daily   spironolactone  25 mg Oral Daily   Continuous Infusions:   LOS: 4 days   Roxan Hockey M.D on 01/29/2021 at 4:31 PM  Go to [email or web address] - for contact info  [facility] - Office  [phone]  If 7PM-7AM, please contact night-coverage [email or web address] Password [email or web address] 01/29/2021, 4:31 PM

## 2021-01-29 NOTE — Plan of Care (Signed)

## 2021-01-30 LAB — CBC
HCT: 31.8 % — ABNORMAL LOW (ref 39.0–52.0)
Hemoglobin: 9.3 g/dL — ABNORMAL LOW (ref 13.0–17.0)
MCH: 29.8 pg (ref 26.0–34.0)
MCHC: 29.2 g/dL — ABNORMAL LOW (ref 30.0–36.0)
MCV: 101.9 fL — ABNORMAL HIGH (ref 80.0–100.0)
Platelets: 130 10*3/uL — ABNORMAL LOW (ref 150–400)
RBC: 3.12 MIL/uL — ABNORMAL LOW (ref 4.22–5.81)
RDW: 15.7 % — ABNORMAL HIGH (ref 11.5–15.5)
WBC: 7.3 10*3/uL (ref 4.0–10.5)
nRBC: 0 % (ref 0.0–0.2)

## 2021-01-30 LAB — BASIC METABOLIC PANEL
BUN: 48 mg/dL — ABNORMAL HIGH (ref 8–23)
CO2: >45 mmol/L — ABNORMAL HIGH (ref 22–32)
Calcium: 8.4 mg/dL — ABNORMAL LOW (ref 8.9–10.3)
Chloride: 80 mmol/L — ABNORMAL LOW (ref 98–111)
Creatinine, Ser: 1.02 mg/dL (ref 0.61–1.24)
GFR, Estimated: >60 mL/min (ref >60)
Glucose, Bld: 127 mg/dL — ABNORMAL HIGH (ref 70–99)
Potassium: 3.7 mmol/L (ref 3.5–5.1)
Sodium: 137 mmol/L (ref 135–145)

## 2021-01-30 NOTE — Progress Notes (Signed)
Went to put on BIPAP machine for tonight and pt wasn't happy about putting machine on states "he's going home tomorrow, he only wears 3lpm o2 and he didn't sleep a damn lick last night". Pt doesn't wear machine at home. RT and Nurse will continue to monitor patient through out the night

## 2021-01-30 NOTE — Plan of Care (Signed)
  Problem: Acute Rehab PT Goals(only PT should resolve) Goal: Pt Will Go Supine/Side To Sit Outcome: Progressing Flowsheets (Taken 01/30/2021 1602) Pt will go Supine/Side to Sit: with modified independence Goal: Patient Will Transfer Sit To/From Stand Outcome: Progressing Flowsheets (Taken 01/30/2021 1602) Patient will transfer sit to/from stand:  with modified independence  with supervision Goal: Pt Will Transfer Bed To Chair/Chair To Bed Outcome: Progressing Flowsheets (Taken 01/30/2021 1602) Pt will Transfer Bed to Chair/Chair to Bed:  with modified independence  with supervision Goal: Pt Will Ambulate Outcome: Progressing Flowsheets (Taken 01/30/2021 1602) Pt will Ambulate:  100 feet  with supervision  with rolling walker  with modified independence   4:03 PM, 01/30/21 Lonell Grandchild, MPT Physical Therapist with [facility] [phone] office [phone] mobile phone

## 2021-01-30 NOTE — TOC Progression Note (Signed)
Transition of Care [facility]) - Progression Note    Patient Details  Name: MATAN STEEN MRN: [id number] Date of Birth: 16-Mar-1945  [facility]) CM/SW Contact  Shade Flood, LCSW Phone Number: 01/30/2021, 5:32 PM  Clinical Narrative:     TOC following. Per MD, pt will likely be stable for dc home tomorrow. PT recommending home with HH at dc. Caryl Pina from [employer] states pt's NIV is approved and they can bring to pt's home upon dc for delivery and teaching. Updated pt's daughter by phone.  Will follow up in AM to further assist with dc needs.  Expected Discharge Plan: Home/Self Care Barriers to Discharge: Continued Medical Work up  Expected Discharge Plan and Services Expected Discharge Plan: Home/Self Care In-house Referral: Clinical Social Work Discharge Planning Services: CM Consult   Living arrangements for the past 2 months: Single Family Home                                       Social Determinants of Health (SDOH) Interventions    Readmission Risk Interventions Readmission Risk Prevention Plan 01/26/2021  Transportation Screening Complete  HRI or Home Care Consult Complete  Social Work Consult for [place] Planning/Counseling Complete  Palliative Care Screening Not Applicable  Medication Review Press photographer) Complete  Some recent data might be hidden

## 2021-01-30 NOTE — Progress Notes (Signed)
Report called and given to Mardene Celeste, RN on Dept. 300. Pt to be transported via bed to room 336.

## 2021-01-30 NOTE — Evaluation (Signed)
Physical Therapy Evaluation Patient Details Name: Joshua [name] MRN: [id number] DOB: 1945-05-01 Today's Date: 01/30/2021  History of Present Illness  [name] is a 75 y.o. male with medical history significant for HFpEF, CAD s/p prior PCI to the LAD and RCA, hypertension, COPD on home oxygen (3L), persistent atrial fibrillation/flutter, recent GI bleed due to internal hemorrhoids (July 2022) and on Eliquis for CVA prevention who presents to the emergency department due to several days of worsening increasing shortness of breath.  Patient states that his legs usually swell over a few months and that once he takes Lasix, the legs usually get better.  However, he has developed increased leg swelling within the last few weeks, but this worsened in the last few days with increased shortness of breath.  Patient states that he could barely walk 15 feet without being short of breath.  He denies chest pain, fever, chest congestion, cough, abdominal pain, nausea, vomiting or diarrhea.  Patient believes that he has gained about 10 pounds within last 12 weeks.  EMS was activated, O2 sat was noted to be 80% on home 3 LPM, this was increased to 5 LPM and patient was sent to the ED for further evaluation.   Clinical Impression  Patient demonstrates good return for sitting up at bedside, had to lean on armrest of chair during transfers without AD, required use of RW for ambulation due to generalized weakness and able to walk in room and hallway without loss of balance, limited mostly due to fatigue.  Patient tolerated sitting up in chair after therapy with his caregiver present in room after therapy - RN notified.  Patient will benefit from continued skilled physical therapy in hospital and recommended venue below to increase strength, balance, endurance for safe ADLs and gait.         Recommendations for follow up therapy are one component of a multi-disciplinary discharge planning process, led by the attending  physician.  Recommendations may be updated based on patient status, additional functional criteria and insurance authorization.  Follow Up Recommendations Home health PT    Assistance Recommended at Discharge Intermittent Supervision/Assistance  Functional Status Assessment Patient has had a recent decline in their functional status and demonstrates the ability to make significant improvements in function in a reasonable and predictable amount of time.  Equipment Recommendations  None recommended by PT    Recommendations for Other Services       Precautions / Restrictions Precautions Precautions: Fall Restrictions Weight Bearing Restrictions: No      Mobility  Bed Mobility Overal bed mobility: Needs Assistance Bed Mobility: Supine to Sit     Supine to sit: Supervision;Min guard     General bed mobility comments: increased time, slightly labored movement    Transfers Overall transfer level: Needs assistance Equipment used: Rolling walker (2 wheels) Transfers: Sit to/from Stand;Bed to chair/wheelchair/BSC Sit to Stand: Min guard   Step pivot transfers: Min guard       General transfer comment: unsteady on feet without AD, had to use RW for safety    Ambulation/Gait Ambulation/Gait assistance: Min guard;Supervision Gait Distance (Feet): 65 Feet Assistive device: Rolling walker (2 wheels) Gait Pattern/deviations: Decreased step length - left;Decreased stance time - right;Decreased stride length;Trunk flexed Gait velocity: decreased     General Gait Details: slightly labored cadence without loss of balance, limited mostly due to fatigue and mild SOB, on 2 LPM with SpO2 dropping from 93% to 85%, increased to 3 LPM to maintain at 90% during  ambulation  Stairs            Wheelchair Mobility    Modified Rankin (Stroke Patients Only)       Balance Overall balance assessment: Needs assistance Sitting-balance support: Feet supported;No upper extremity  supported Sitting balance-Leahy Scale: Good Sitting balance - Comments: seated at EOB   Standing balance support: Reliant on assistive device for balance;During functional activity;Bilateral upper extremity supported   Standing balance comment: fair/poor without AD, fair/good using RW                             Pertinent Vitals/Pain Pain Assessment: No/denies pain    Home Living Family/patient expects to be discharged to:: Private residence Living Arrangements: Children Available Help at Discharge: Family;Available PRN/intermittently Type of Home: Mobile home Home Access: Stairs to enter Entrance Stairs-Rails: None Entrance Stairs-Number of Steps: 1   Home Layout: One level Home Equipment: Conservation officer, nature (2 wheels);Cane - single point;BSC/3in1;Shower seat;Grab bars - tub/shower      Prior Function Prior Level of Function : Needs assist       Physical Assist : Mobility (physical) Mobility (physical): Bed mobility;Transfers;Gait;Stairs   Mobility Comments: household ambulator using RW, on 3 LPM O2 at home ADLs Comments: assisted by caregivers/family     Hand Dominance   Dominant Hand: Right    Extremity/Trunk Assessment   Upper Extremity Assessment Upper Extremity Assessment: Overall WFL for tasks assessed    Lower Extremity Assessment Lower Extremity Assessment: Generalized weakness    Cervical / Trunk Assessment Cervical / Trunk Assessment: Normal  Communication   Communication: No difficulties  Cognition Arousal/Alertness: Awake/alert Behavior During Therapy: WFL for tasks assessed/performed Overall Cognitive Status: Within Functional Limits for tasks assessed                                          General Comments      Exercises     Assessment/Plan    PT Assessment Patient needs continued PT services  PT Problem List Decreased strength;Decreased activity tolerance;Decreased balance;Decreased mobility       PT  Treatment Interventions DME instruction;Gait training;Stair training;Functional mobility training;Therapeutic activities;Therapeutic exercise;Patient/family education;Balance training    PT Goals (Current goals can be found in the Care Plan section)  Acute Rehab PT Goals Patient Stated Goal: return home with family/caregiver to assist PT Goal Formulation: With patient/family Time For Goal Achievement: 02/03/21 Potential to Achieve Goals: Good    Frequency Min 3X/week   Barriers to discharge        Co-evaluation               AM-PAC PT "6 Clicks" Mobility  Outcome Measure Help needed turning from your back to your side while in a flat bed without using bedrails?: None Help needed moving from lying on your back to sitting on the side of a flat bed without using bedrails?: A Little Help needed moving to and from a bed to a chair (including a wheelchair)?: A Little Help needed standing up from a chair using your arms (e.g., wheelchair or bedside chair)?: A Little Help needed to walk in hospital room?: A Little Help needed climbing 3-5 steps with a railing? : A Lot 6 Click Score: 18    End of Session Equipment Utilized During Treatment: Oxygen Activity Tolerance: Patient limited by fatigue;Patient tolerated treatment well Patient left:  in chair;with call bell/phone within reach;with family/visitor present Nurse Communication: Mobility status PT Visit Diagnosis: Unsteadiness on feet (R26.81);Other abnormalities of gait and mobility (R26.89);Muscle weakness (generalized) (M62.81)    Time: 2174-7159 PT Time Calculation (min) (ACUTE ONLY): 28 min   Charges:   PT Evaluation $PT Eval Moderate Complexity: 1 Mod PT Treatments $Therapeutic Activity: 23-37 mins        4:01 PM, 01/30/21 Lonell Grandchild, MPT Physical Therapist with [facility] [phone] office [phone] mobile phone

## 2021-01-30 NOTE — Progress Notes (Addendum)
PROGRESS NOTE     Joshua [name], is a 75 y.o. male, DOB - 1945/09/01, NKN:[id number]  Admit date - 01/25/2021   Admitting Physician Bernadette Hoit, DO  Outpatient Primary MD for the patient is [name], Joshua Kaufmann, MD  LOS - 5  Chief Complaint  Patient presents with   Shortness of Breath       Brief Narrative:  75 y.o. male with medical history significant for HFpEF, CAD s/p prior PCI to the LAD and RCA, hypertension, COPD on home oxygen (3L), persistent atrial fibrillation/flutter, recent GI bleed due to internal hemorrhoids (July 2022) and on Eliquis for CVA admitted on 01/25/2021 cute on chronic hypoxic respiratory failure secondary to CHF exacerbation  Assessment & Plan:   Principal Problem:   Acute on chronic respiratory failure with hypoxia and hypercapnia (HCC) Active Problems:   Acute on chronic diastolic (congestive) heart failure/Hypertrophic Cardiomyopathy   Uncompensated respiratory acidosis due to HyperCarpnia   COPD (chronic obstructive pulmonary disease) (HCC)   CAD in native artery   Atrial fibrillation, chronic (HCC)   Elevated brain natriuretic peptide (BNP) level   Macrocytic anemia   Hypoalbuminemia   Elevated d-dimer    1)Acute on chronic Hypoxic and hypercapnic respiratory failure secondary to acute COPD exacerbation -Patient had uncompensated respiratory acidosis requiring prolonged BiPAP use -PCO2 of 101, with BiPAP use PCO2 down to 87.7 PH 7.29 , with BiPAP use pH is up to 7.38 Po2 79 --- with BiPAP use PO2 is up to 98.0 -Continue Solu-Medrol, bronchodilators mucolytics and doxycycline -Respiratory status improving with BiPAP use -More awake, more coherent  2)HFpEF--- -Echo with EF of 65 to 70% with hypertrophic cardiomyopathy Wt 179 on 01/25/21 , Now 177 lb C/n  IV Lasix to 60 mg every 12 h -Continue to monitor fluid input and output monitoring and daily weights -Continue Aldactone Weight is 180 >> 178 >>167.99  Intake/Output Summary (Last  24 hours) at 01/30/2021 1142 Last data filed at 01/30/2021 0800 Gross per 24 hour  Intake 480 ml  Output 4100 ml  Net -3620 ml   3)-D-dimer elevated, VQ scan intermediate for PE clinically low index of suspicion for PE--- patient is already on Eliquis for atrial fibrillation - 4)H/o CAD--status post prior angioplasty and stent placement -Chest pain-free -continue Crestor, not on aspirin due to Eliquis use  5)Chronic Atrial Fibrillation----stable -PTA patient was on Tikosyn -c/n Eliquis  6)Social/Ethics--- advanced directives and plan of care discussed with patient and patient's daughter, patient remains a full code  7)Generalized weakness and deconditioning--- PT eval requested  Disposition/Need for in-Hospital Stay- patient unable to be discharged at this time due to --- acute on chronic hypoxic respiratory failure due to CHF requiring IV diuresis and increased supplemental oxygen, also requiring IV bronchodilators mucolytics for COPD exacerbation -Possible discharge in 1 to 2 days on NIV machine if continues to improve  Status is: Inpatient  Remains inpatient appropriate because: Disposition above  Disposition: The patient is from: Home              Anticipated d/c is to: Home              Anticipated d/c date is: 2 days              Patient currently is not medically stable to d/c. Barriers: Not Clinically Stable-   Code Status :  -  Code Status: Full Code   Family Communication:   (patient is alert, awake and coherent)  updated daughter Ms Joshua [name] at  [phone]  Consults  :  Na  DVT Prophylaxis  :   - SCDs   SCDs Start: 01/25/21 2331 apixaban (ELIQUIS) tablet 5 mg    Lab Results  Component Value Date   PLT 130 (L) 01/30/2021    Inpatient Medications  Scheduled Meds:  apixaban  5 mg Oral BID   Chlorhexidine Gluconate Cloth  6 each Topical Daily   doxycycline  100 mg Oral Q12H   feeding supplement  237 mL Oral BID BM   furosemide  60 mg Intravenous  BID   guaiFENesin  600 mg Oral BID   melatonin  6 mg Oral QHS   methylPREDNISolone (SOLU-MEDROL) injection  40 mg Intravenous Q12H   mupirocin ointment  1 application Nasal BID   pantoprazole  40 mg Oral Daily   spironolactone  25 mg Oral Daily   Continuous Infusions: PRN Meds:.acetaminophen, ipratropium-albuterol   Anti-infectives (From admission, onward)    Start     Dose/Rate Route Frequency Ordered Stop   01/28/21 2200  doxycycline (VIBRA-TABS) tablet 100 mg        100 mg Oral Every 12 hours 01/28/21 1710           Subjective: Brown Human today has no fevers, no emesis,  No chest pain,   - -a lot more interactive alert. -Oral intake is fair,  Objective: Vitals:   01/30/21 0800 01/30/21 0900 01/30/21 1000 01/30/21 1100  BP: (!) 120/58 (!) 124/41 (!) 140/33 (!) 148/54  Pulse: 82 66 (!) 46 81  Resp: (!) 36 (!) 22 20 (!) 26  Temp:      TempSrc:      SpO2: 99% 100% 100% 100%  Weight:      Height:        Intake/Output Summary (Last 24 hours) at 01/30/2021 1142 Last data filed at 01/30/2021 0800 Gross per 24 hour  Intake 480 ml  Output 4100 ml  Net -3620 ml   Filed Weights   01/28/21 1900 01/29/21 0500 01/30/21 0500  Weight: 81.8 kg 80.8 kg 76.2 kg    Physical Exam  Gen:- Awake Alert, no conversational dyspnea  HEENT:- [place].AT, No sclera icterus Nose-- 4L/min , alternate with Bipap Mask Neck-Supple Neck,No JVD,.  Lungs--air movement improving, no wheezing  CV- S1, S2 normal, irregular , 3/6 SM Abd-  +ve B.Sounds, Abd Soft, No tenderness,    Extremity/Skin:- +1 edema, pedal pulses present  Psych-affect is appropriate, oriented x3 Neuro-generalized weakness, no new focal deficits, no tremors  Data Reviewed: I have personally reviewed following labs and imaging studies  CBC: Recent Labs  Lab 01/25/21 1535 01/26/21 0525 01/27/21 0524 01/30/21 0450  WBC 5.7 5.3 7.5 7.3  NEUTROABS 4.5  --   --   --   HGB 10.0* 9.7* 10.1* 9.3*  HCT 35.7* 33.6* 35.9*  31.8*  MCV 107.5* 107.3* 109.5* 101.9*  PLT 126* 125* 149* 979*   Basic Metabolic Panel: Recent Labs  Lab 01/25/21 1535 01/26/21 0525 01/27/21 0524 01/28/21 0444 01/30/21 0450  NA 141 143 142 140 137  K 4.4 4.1 5.0 4.2 3.7  CL 92* 92* 91* 89* 80*  CO2 41* >45* >45* >45* >45*  GLUCOSE 94 89 94 138* 127*  BUN 44* 39* 37* 37* 48*  CREATININE 1.13 1.01 1.13 1.13 1.02  CALCIUM 8.8* 8.5* 8.6* 8.5* 8.4*  MG  --  2.4  --   --   --   PHOS  --  3.4  --   --   --  GFR: Estimated Creatinine Clearance: 59.6 mL/min (by C-G formula based on SCr of 1.02 mg/dL). Liver Function Tests: Recent Labs  Lab 01/25/21 1500 01/26/21 0525  AST 23 20  ALT 14 13  ALKPHOS 55 48  BILITOT 2.2* 1.2  PROT 6.5 5.9*  ALBUMIN 3.1* 2.8*   No results for input(s): LIPASE, AMYLASE in the last 168 hours. No results for input(s): AMMONIA in the last 168 hours. Coagulation Profile: No results for input(s): INR, PROTIME in the last 168 hours. Cardiac Enzymes: No results for input(s): CKTOTAL, CKMB, CKMBINDEX, TROPONINI in the last 168 hours. BNP (last 3 results) No results for input(s): PROBNP in the last 8760 hours. HbA1C: No results for input(s): HGBA1C in the last 72 hours. CBG: No results for input(s): GLUCAP in the last 168 hours. Lipid Profile: No results for input(s): CHOL, HDL, LDLCALC, TRIG, CHOLHDL, LDLDIRECT in the last 72 hours. Thyroid Function Tests: No results for input(s): TSH, T4TOTAL, FREET4, T3FREE, THYROIDAB in the last 72 hours. Anemia Panel: No results for input(s): VITAMINB12, FOLATE, FERRITIN, TIBC, IRON, RETICCTPCT in the last 72 hours.  Urine analysis:    Component Value Date/Time   COLORURINE YELLOW 01/14/2015 1112   APPEARANCEUR Clear 10/29/2019 0924   LABSPEC 1.019 01/14/2015 1112   PHURINE 5.0 01/14/2015 1112   GLUCOSEU Negative 10/29/2019 0924   HGBUR LARGE (A) 01/14/2015 1112   BILIRUBINUR Negative 10/29/2019 0924   KETONESUR NEGATIVE 01/14/2015 1112    PROTEINUR 2+ (A) 10/29/2019 0924   PROTEINUR 100 (A) 01/14/2015 1112   UROBILINOGEN negative 02/23/2015 1253   NITRITE Negative 10/29/2019 0924   NITRITE NEGATIVE 01/14/2015 1112   LEUKOCYTESUR Negative 10/29/2019 0924   Sepsis Labs: @LABRCNTIP (procalcitonin:4,lacticidven:4)  ) Recent Results (from the past 240 hour(s))  Resp Panel by RT-PCR (Flu A&B, Covid) Nasopharyngeal Swab     Status: None   Collection Time: 01/25/21  5:20 PM   Specimen: Nasopharyngeal Swab; Nasopharyngeal(NP) swabs in vial transport medium  Result Value Ref Range Status   SARS Coronavirus 2 by RT PCR NEGATIVE NEGATIVE Final    Comment: (NOTE) SARS-CoV-2 target nucleic acids are NOT DETECTED.  The SARS-CoV-2 RNA is generally detectable in upper respiratory specimens during the acute phase of infection. The lowest concentration of SARS-CoV-2 viral copies this assay can detect is 138 copies/mL. A negative result does not preclude SARS-Cov-2 infection and should not be used as the sole basis for treatment or other patient management decisions. A negative result may occur with  improper specimen collection/handling, submission of specimen other than nasopharyngeal swab, presence of viral mutation(s) within the areas targeted by this assay, and inadequate number of viral copies(<138 copies/mL). A negative result must be combined with clinical observations, patient history, and epidemiological information. The expected result is Negative.  Fact Sheet for Patients:  EntrepreneurPulse.com.au  Fact Sheet for Healthcare Providers:  IncredibleEmployment.be  This test is no t yet approved or cleared by the [place] FDA and  has been authorized for detection and/or diagnosis of SARS-CoV-2 by FDA under an Emergency Use Authorization (EUA). This EUA will remain  in effect (meaning this test can be used) for the duration of the COVID-19 declaration under Section 564(b)(1) of the  Act, 21 U.S.C.section 360bbb-3(b)(1), unless the authorization is terminated  or revoked sooner.       Influenza A by PCR NEGATIVE NEGATIVE Final   Influenza B by PCR NEGATIVE NEGATIVE Final    Comment: (NOTE) The Xpert Xpress SARS-CoV-2/FLU/RSV plus assay is intended as an aid in the  diagnosis of influenza from Nasopharyngeal swab specimens and should not be used as a sole basis for treatment. Nasal washings and aspirates are unacceptable for Xpert Xpress SARS-CoV-2/FLU/RSV testing.  Fact Sheet for Patients: EntrepreneurPulse.com.au  Fact Sheet for Healthcare Providers: IncredibleEmployment.be  This test is not yet approved or cleared by the [place] FDA and has been authorized for detection and/or diagnosis of SARS-CoV-2 by FDA under an Emergency Use Authorization (EUA). This EUA will remain in effect (meaning this test can be used) for the duration of the COVID-19 declaration under Section 564(b)(1) of the Act, 21 U.S.C. section 360bbb-3(b)(1), unless the authorization is terminated or revoked.  Performed at [facility], [address], [place], [place] [address]   MRSA Next Gen by PCR, Nasal     Status: Abnormal   Collection Time: 01/28/21  7:21 PM   Specimen: Nasal Mucosa; Nasal Swab  Result Value Ref Range Status   MRSA by PCR Next Gen DETECTED (A) NOT DETECTED Final    Comment: CRITICAL RESULT CALLED TO, READ BACK BY AND VERIFIED WITH: ERTEL,A 0027 01/29/2021 COLEMAN,R (NOTE) The GeneXpert MRSA Assay (FDA approved for NASAL specimens only), is one component of a comprehensive MRSA colonization surveillance program. It is not intended to diagnose MRSA infection nor to guide or monitor treatment for MRSA infections. Test performance is not FDA approved in patients less than 49 years old. Performed at [facility], [address] [place], [place] [address]      Radiology Studies: No results found.   Scheduled Meds:   apixaban  5 mg Oral BID   Chlorhexidine Gluconate Cloth  6 each Topical Daily   doxycycline  100 mg Oral Q12H   feeding supplement  237 mL Oral BID BM   furosemide  60 mg Intravenous BID   guaiFENesin  600 mg Oral BID   melatonin  6 mg Oral QHS   methylPREDNISolone (SOLU-MEDROL) injection  40 mg Intravenous Q12H   mupirocin ointment  1 application Nasal BID   pantoprazole  40 mg Oral Daily   spironolactone  25 mg Oral Daily   Continuous Infusions:   LOS: 5 days   Roxan Hockey M.D on 01/30/2021 at 11:42 AM  Go to [email or web address] - for contact info  [facility] - Office  [phone]  If 7PM-7AM, please contact night-coverage [email or web address] Password [facility] 01/30/2021, 11:42 AM

## 2021-01-30 NOTE — Progress Notes (Signed)
Call given to friend Fraser Din on the chart about patient being moved to 300. Friend to update patient's daughter.

## 2021-01-31 LAB — BLOOD GAS, ARTERIAL
Acid-Base Excess: 29.4 mmol/L — ABNORMAL HIGH (ref 0.0–2.0)
Bicarbonate: 52.3 mmol/L — ABNORMAL HIGH (ref 20.0–28.0)
Bicarbonate: 54.9 mmol/L — ABNORMAL HIGH (ref 20.0–28.0)
Drawn by: 21310
Drawn by: 41977
FIO2: 32
FIO2: 36
O2 Saturation: 99.6 %
O2 Saturation: 91.2 %
Patient temperature: 36.6
Patient temperature: 37
pCO2 arterial: 112 mmHg (ref 32.0–48.0)
pCO2 arterial: 100 mmHg (ref 32.0–48.0)
pH, Arterial: 7.327 — ABNORMAL LOW (ref 7.350–7.450)
pH, Arterial: 7.387 (ref 7.350–7.450)
pO2, Arterial: 218 mmHg — ABNORMAL HIGH (ref 83.0–108.0)
pO2, Arterial: 68.7 mmHg — ABNORMAL LOW (ref 83.0–108.0)

## 2021-01-31 MED ORDER — GUAIFENESIN ER 600 MG PO TB12
600.0000 mg | ORAL_TABLET | Freq: Two times a day (BID) | ORAL | 0 refills | Status: AC
Start: 2021-01-31 — End: ?

## 2021-01-31 MED ORDER — CEPHALEXIN 500 MG PO CAPS: 500.0000 mg | ORAL_CAPSULE | Freq: Three times a day (TID) | ORAL | 0 refills | Status: DC

## 2021-01-31 MED ORDER — PREDNISONE 20 MG PO TABS: 40.0000 mg | ORAL_TABLET | Freq: Every day | ORAL | 0 refills | Status: DC

## 2021-01-31 MED ORDER — APIXABAN 5 MG PO TABS: 5.0000 mg | ORAL_TABLET | Freq: Two times a day (BID) | ORAL | 1 refills | Status: AC

## 2021-01-31 MED ORDER — ALBUTEROL SULFATE (2.5 MG/3ML) 0.083% IN NEBU: 2.5000 mg | INHALATION_SOLUTION | RESPIRATORY_TRACT | 2 refills | Status: AC | PRN

## 2021-01-31 MED ORDER — ROSUVASTATIN CALCIUM 10 MG PO TABS: 10.0000 mg | ORAL_TABLET | Freq: Every day | ORAL | 5 refills | Status: AC

## 2021-01-31 MED ORDER — APIXABAN 5 MG PO TABS: 5.0000 mg | ORAL_TABLET | Freq: Two times a day (BID) | ORAL | 1 refills | Status: DC

## 2021-01-31 MED ORDER — PANTOPRAZOLE SODIUM 40 MG PO TBEC: 40.0000 mg | DELAYED_RELEASE_TABLET | Freq: Every day | ORAL | 2 refills | Status: AC

## 2021-01-31 MED ORDER — FUROSEMIDE 40 MG PO TABS: 40.0000 mg | ORAL_TABLET | Freq: Two times a day (BID) | ORAL | 3 refills | Status: AC

## 2021-01-31 MED ORDER — BUDESONIDE-FORMOTEROL FUMARATE 160-4.5 MCG/ACT IN AERO
2.0000 | INHALATION_SPRAY | Freq: Two times a day (BID) | RESPIRATORY_TRACT | 12 refills | Status: AC
Start: 2021-01-31 — End: ?

## 2021-01-31 MED ORDER — ROSUVASTATIN CALCIUM 10 MG PO TABS: 10.0000 mg | ORAL_TABLET | Freq: Every day | ORAL | 5 refills | Status: DC

## 2021-01-31 MED ORDER — SPIRONOLACTONE 25 MG PO TABS: 25.0000 mg | ORAL_TABLET | Freq: Every day | ORAL | 3 refills | Status: DC

## 2021-01-31 MED ORDER — CEPHALEXIN 500 MG PO CAPS: 500.0000 mg | ORAL_CAPSULE | Freq: Three times a day (TID) | ORAL | 0 refills | Status: AC

## 2021-01-31 MED ORDER — DOXYCYCLINE HYCLATE 100 MG PO TABS: 100.0000 mg | ORAL_TABLET | Freq: Two times a day (BID) | ORAL | 0 refills | Status: DC

## 2021-01-31 MED ORDER — BUDESONIDE-FORMOTEROL FUMARATE 160-4.5 MCG/ACT IN AERO: 2.0000 | INHALATION_SPRAY | Freq: Two times a day (BID) | RESPIRATORY_TRACT | 12 refills | Status: DC

## 2021-01-31 MED ORDER — DOXYCYCLINE HYCLATE 100 MG PO TABS
100.0000 mg | ORAL_TABLET | Freq: Two times a day (BID) | ORAL | 0 refills | Status: AC
Start: 2021-01-31 — End: 2021-02-05

## 2021-01-31 MED ORDER — PREDNISONE 20 MG PO TABS: 40.0000 mg | ORAL_TABLET | Freq: Every day | ORAL | 0 refills | Status: AC

## 2021-01-31 MED ORDER — FUROSEMIDE 40 MG PO TABS: 40.0000 mg | ORAL_TABLET | Freq: Two times a day (BID) | ORAL | 3 refills | Status: DC

## 2021-01-31 MED ORDER — GUAIFENESIN ER 600 MG PO TB12: 600.0000 mg | ORAL_TABLET | Freq: Two times a day (BID) | ORAL | 0 refills | Status: DC

## 2021-01-31 MED ORDER — ACETAMINOPHEN 325 MG PO TABS: 650.0000 mg | ORAL_TABLET | Freq: Four times a day (QID) | ORAL | 0 refills | Status: AC | PRN

## 2021-01-31 MED ORDER — PANTOPRAZOLE SODIUM 40 MG PO TBEC: 40.0000 mg | DELAYED_RELEASE_TABLET | Freq: Every day | ORAL | 2 refills | Status: DC

## 2021-01-31 MED ORDER — ACETAMINOPHEN 325 MG PO TABS: 650.0000 mg | ORAL_TABLET | Freq: Four times a day (QID) | ORAL | 0 refills | Status: DC | PRN

## 2021-01-31 MED ORDER — ALBUTEROL SULFATE (2.5 MG/3ML) 0.083% IN NEBU: 2.5000 mg | INHALATION_SOLUTION | RESPIRATORY_TRACT | 2 refills | Status: DC | PRN

## 2021-01-31 MED ORDER — SPIRONOLACTONE 25 MG PO TABS: 25.0000 mg | ORAL_TABLET | Freq: Every day | ORAL | 3 refills | Status: AC

## 2021-01-31 NOTE — Progress Notes (Signed)
Patient taken off of BIPAP by RN so patient can eat lunch. RN notified RT of this. Will monitor as needed.

## 2021-01-31 NOTE — Progress Notes (Signed)
Pt placed on BIPAP due to increased co2, explained to patient that he has to wear machine now and when he goes to sleep its a must because of his co2 levels. Pt complains that he can't sleep with machine maybe something can be order to help with this situation. Pt wasn't happy but in agreement

## 2021-01-31 NOTE — Progress Notes (Signed)
   01/31/21 0349  Provider Notification  Provider Name/Title Dr. Denton Brick  Date Provider Notified 01/31/21  Time Provider Notified [phone]  Notification Type Page Shea Evans)  Notification Reason Critical result  Test performed and critical result pCO2 100  Date Critical Result Received 01/31/21  Time Critical Result Received 0808  Provider response No new orders  Date of Provider Response 01/31/21  Time of Provider Response [phone]

## 2021-01-31 NOTE — Progress Notes (Signed)
RT obtained ABG this morning per MD order. ABG resulted and showed patients CO2 was still high at 100 yet his pH was within normal limits at 7.38. After talking with patient RT removed patient from BIPAP and placed him on 3L so patient could eat his breakfast. RT explained to patient that his CO2 levels were still high and that he would need to go back on BIPAP about an hour or so after he finishes eating. Patient agrees to do so. RN aware.

## 2021-01-31 NOTE — Progress Notes (Signed)
Patient placed back on BIPAP 12/6 with a 4L O2 bleed in. Patient is tolerating well at this time. RN aware.

## 2021-01-31 NOTE — Progress Notes (Signed)
RN called due to patient's PCO2 at 112.  Apparently patient refused BiPAP overnight and was on supplemental oxygen via [place] at 3 LPM.  RN was to encourage patient to go on BiPAP so as to avoid being intubated if he should become altered.  Patient is full code. RN and RRT talked to patient. He's cooperative with BIPAP now. ABG will be repeated in 2hrs

## 2021-01-31 NOTE — TOC Transition Note (Signed)
Transition of Care [facility]) - CM/SW Discharge Note   Patient Details  Name: Joshua [name] MRN: [id number] Date of Birth: 1945-09-07  [facility] CM/SW Contact:  Shade Flood, LCSW Phone Number: 01/31/2021, 1:48 PM   Clinical Narrative:     Pt stable for dc home with [facility] today per MD. Orders in for resumption of [place] from [place]. Notified Calvin at [facility] of pt's dc. [employer] will deliver and instruct on NIV and neb machine as soon as pt gets home. Spoke with pt's [facility] social worker Fara Chute Sharla Kidney [phone]) to inquire if pt eligible for any in home caregiver services. Per Fara Chute, pt does qualify and she stated she would reach out to pt's daughter to get the application for services started.  Pt's daughter updated and aware of all of the above.  There are no other TOC needs for dc.  Final next level of care: [place] Barriers to Discharge: Barriers Resolved   Patient Goals and CMS Choice Patient states their goals for this hospitalization and ongoing recovery are:: Return home CMS Medicare.gov Compare Post Acute Care list provided to:: Patient Choice offered to / list presented to : Patient  Discharge Placement                       Discharge Plan and Services In-house Referral: Clinical Social Work Discharge Planning Services: CM Consult            DME Arranged: NIV, Nebulizer machine DME Agency: [employer] Date DME Agency Contacted: 01/29/21   Representative spoke with at DME Agency: Caryl Pina            Social Determinants of Health ([place]) Interventions     Readmission Risk Interventions Readmission Risk Prevention Plan 01/26/2021  Transportation Screening Complete  HRI or [place] Complete  Social Work Consult for [place] Planning/Counseling Complete  Palliative Care Screening Not Applicable  Medication Review Press photographer) Complete  Some recent data might be hidden

## 2021-01-31 NOTE — Discharge Summary (Signed)
Joshua [name], is a 75 y.o. male  DOB [birth year]  MRN [id number].  Admission date:  01/25/2021  Admitting Physician  Bernadette Hoit, DO  Discharge Date:  01/31/2021   Primary MD  [name], [name], MD  Recommendations for primary care physician for things to follow:   1)Very low-salt diet advised 2)Weigh yourself daily, call if you gain more than 3 pounds in 1 day or more than 5 pounds in 1 week as your diuretic medications may need to be adjusted 3)Limit your Fluid  intake to no more than 60 ounces (1.8 Liters) per day 4)You are taking Eliquis/Apixaban which is a Blood Thinner so Please Avoid ibuprofen/Advil/Aleve/Motrin/Goody Powders/Naproxen/BC powders/Meloxicam/Diclofenac/Indomethacin and other Nonsteroidal anti-inflammatory medications as these will make you more likely to bleed and can cause stomach ulcers, can also cause Kidney problems.  5)Please use NIV/TRilogy machine every time you go to sleep or take a nap 6)you need oxygen at home at 3 L via nasal cannula continuously while awake and while asleep--- smoking or having open fires around oxygen can cause fire, significant injury and death   Admission Diagnosis  Acute exacerbation of CHF (congestive heart failure) ([place]) [I50.9]   Discharge Diagnosis  Acute exacerbation of CHF (congestive heart failure) ([place]) [I50.9]   Principal Problem:   Acute on chronic respiratory failure with hypoxia and hypercapnia (HCC) Active Problems:   Acute on chronic diastolic (congestive) heart failure/Hypertrophic Cardiomyopathy   Uncompensated respiratory acidosis due to HyperCarpnia   COPD (chronic obstructive pulmonary disease) ([place])   CAD in native artery   Atrial fibrillation, chronic (HCC)   Elevated brain natriuretic peptide (BNP) level   Macrocytic anemia   Hypoalbuminemia   Elevated d-dimer      Past Medical History:  Diagnosis Date   Acute lower  GI bleeding    related to gastritis / viral infection   Cataract    Cirrhosis ([place])    Congestive heart failure (CHF) ([place]) 11/21/2020   COPD (chronic obstructive pulmonary disease) ([place])    PFT 05-05-2010, FEV1 .9 (26%) ratio 60   Coronary artery disease    Gastritis 06-10-2006   Hiatal hernia 06-10-2006   EGD   HOH (hard of hearing)    Hx of adenomatous colonic polyps 2005, 2013   Colonoscopy   Hypercholesteremia    Hypertension    Hypertr obst cardiomyop    mild subaortic stenosis   Iron deficiency anemia, unspecified    LVH (left ventricular hypertrophy)    Other B-complex deficiencies    Persistent atrial fibrillation (HCC)    persistent   Rheumatic fever    Thrombocytopenia ([place]) 05/2008    Past Surgical History:  Procedure Laterality Date   ATRIAL FLUTTER ABLATION  06/2010   CTI ablation performed by [name]   CARDIAC CATHETERIZATION  05/28/2005   Widely patent coronaries and normal LV function.   CARDIAC CATHETERIZATION  08/11/2001   80% LAD stenosis successfully stented with a 3.5x63mm Cypher DES postdilated to 3.53mm resulting in reduction of 80% to 0%.  CARDIAC CATHETERIZATION  01/15/2001   Focal 95% proximal RCA stenosis, stented with a 3x49mm Zeta multilink stent with postdilatation utilizing a 3.5x34mm Quantum balloon with stenosis being reduced to 0%.   CARDIOVASCULAR STRESS TEST  05/29/2011   No scintigraphic evidence of inducible myocardial ischemia. No ECG changes. EKG negative for ischemia.   CARDIOVERSION N/A 10/20/2019   Procedure: CARDIOVERSION;  Surgeon: Sanda Klein, MD;  Location: [facility];  Service: Cardiovascular;  Laterality: N/A;   CARDIOVERSION N/A 01/14/2020   Procedure: CARDIOVERSION;  Surgeon: Buford Dresser, MD;  Location: [facility];  Service: Cardiovascular;  Laterality: N/A;   CORONARY ANGIOPLASTY     2002-2003   SHOULDER SURGERY     TRANSTHORACIC ECHOCARDIOGRAM  05/29/2011   EF >70%, severe concentric LV hypertrophy, severe  mitral annular calcification, mild-moderate aortic regurg,     HPI  from the history and physical done on the day of admission:   Chief Complaint: Shortness of breath   HPI: Joshua [name] is a 76 y.o. male with medical history significant for HFpEF, CAD s/p prior PCI to the LAD and RCA, hypertension, COPD on home oxygen (3L), persistent atrial fibrillation/flutter, recent GI bleed due to internal hemorrhoids (July 2022) and on Eliquis for CVA prevention who presents to the emergency department due to several days of worsening increasing shortness of breath.  Patient states that his legs usually swell over a few months and that once he takes Lasix, the legs usually get better.  However, he has developed increased leg swelling within the last few weeks, but this worsened in the last few days with increased shortness of breath.  Patient states that he could barely walk 15 feet without being short of breath.  He denies chest pain, fever, chest congestion, cough, abdominal pain, nausea, vomiting or diarrhea.  Patient believes that he has gained about 10 pounds within last 12 weeks.  EMS was activated, O2 sat was noted to be 80% on home 3 LPM, this was increased to 5 LPM and patient was sent to the ED for further evaluation.   ED Course:  In the emergency department, BP was soft at 108/45, he was initially tachypneic.  O2 sat was 92-100% on 4 LPM.  Work-up in the ED showed microcytic anemia, BUN/creatinine 44/1.3, D-dimer 3.55, albumin 3.1, BNP 1059.  Influenza A, B and SARS coronavirus 2 was negative. Chest x-ray showed probable congestive heart failure.  Cardiomegaly, pulmonary venous hypertension, mild edema and small effusions. IV Lasix 80 mg x 1 and therapeutic Lovenox x1 was given.  Hospitalist was asked to admit patient for further evaluation and management.       Hospital Course:    Brief Narrative:  75 y.o. male with medical history significant for HFpEF, CAD s/p prior PCI to the LAD and RCA,  hypertension, COPD on home oxygen (3L), persistent atrial fibrillation/flutter, recent GI bleed due to internal hemorrhoids (July 2022) and on Eliquis for CVA admitted on 01/25/2021 cute on chronic hypoxic respiratory failure secondary to CHF exacerbation  A/p   1)Acute on chronic Hypoxic and hypercapnic respiratory failure secondary to acute COPD exacerbation -Patient had uncompensated respiratory acidosis requiring prolonged BiPAP use -PCO2 of 101, with BiPAP use PCO2 down to 87.7 PH 7.29 , with BiPAP use pH is up to 7.38 Po2 79 --- with BiPAP use PO2 is up to 98.0 -Treated with Solu-Medrol, bronchodilators mucolytics and doxycycline -Respiratory status improved with BiPAP use -More awake, more coherent -Discharged home on NIV/trilogy machine   2)HFpEF--- -Echo with  EF of 65 to 70% with hypertrophic cardiomyopathy Wt 179 on 01/25/21 , Now 177 lb -Treated with IV Lasix and p.o. Aldactone Weight is 180 >> 178 >>167.99>. 164.68  3)-D-dimer elevated, VQ scan intermediate for PE clinically low index of suspicion for PE--- patient is already on Eliquis for atrial fibrillation - 4)H/o CAD--status post prior angioplasty and stent placement -Chest pain-free -continue Crestor, not on aspirin due to Eliquis use   5)Chronic Atrial Fibrillation----stable -PTA patient was on Tikosyn -c/n Eliquis   6)Social/Ethics--- advanced directives and plan of care discussed with patient and patient's daughter, patient remains a full code   7)Generalized weakness and deconditioning--- PT eval appreciated, recommends home health PT   Disposition--discharge home with home health services   Disposition: The patient is from: Home              Anticipated d/c is to: Home                Code Status :  -  Code Status: Full Code    Family Communication:   (patient is alert, awake and coherent)  updated daughter Ms [name] at [phone]    Discharge Condition: Stable  Follow UP   Follow-up  Information     [name], [name], MD. Schedule an appointment as soon as possible for a visit in 1 week(s).   Specialties: Family Medicine, Cardiology Contact information: [place] [address] [phone]         Troy Sine, MD .   Specialty: Cardiology Contact information: [address] [place] [address] [phone]         Thompson Grayer, MD .   Specialty: Cardiology Contact information: [place] [address] [place]  [address] [phone]                 Diet and Activity recommendation:  As advised  Discharge Instructions    Discharge Instructions     Call MD for:  difficulty breathing, headache or visual disturbances   Complete by: As directed    Call MD for:  persistant dizziness or light-headedness   Complete by: As directed    Call MD for:  persistant nausea and vomiting   Complete by: As directed    Call MD for:  severe uncontrolled pain   Complete by: As directed    Call MD for:  temperature >100.4   Complete by: As directed    Diet - low sodium heart healthy   Complete by: As directed    Discharge instructions   Complete by: As directed    1)Very low-salt diet advised 2)Weigh yourself daily, call if you gain more than 3 pounds in 1 day or more than 5 pounds in 1 week as your diuretic medications may need to be adjusted 3)Limit your Fluid  intake to no more than 60 ounces (1.8 Liters) per day 4)You are taking Eliquis/Apixaban which is a Blood Thinner so Please Avoid ibuprofen/Advil/Aleve/Motrin/Goody Powders/Naproxen/BC powders/Meloxicam/Diclofenac/Indomethacin and other Nonsteroidal anti-inflammatory medications as these will make you more likely to bleed and can cause stomach ulcers, can also cause Kidney problems.  5)Please use NIV/TRilogy machine every time you go to sleep or take a nap 6)you need oxygen at home at 3 L via nasal cannula continuously while awake and while asleep--- smoking or  having open fires around oxygen can cause fire, significant injury and death   Discharge wound care:   Complete by: As directed  As advised   For home use only DME Nebulizer machine   Complete by: As directed    Patient needs a nebulizer to treat with the following condition: COPD with acute exacerbation ([place])   Length of Need: Lifetime   Increase activity slowly   Complete by: As directed         Discharge Medications     Allergies as of 01/31/2021   No Known Allergies      Medication List     TAKE these medications    acetaminophen 325 MG tablet Commonly known as: TYLENOL Take 2 tablets (650 mg total) by mouth every 6 (six) hours as needed for mild pain, moderate pain or fever.   albuterol (2.5 MG/3ML) 0.083% nebulizer solution Commonly known as: PROVENTIL Take 3 mLs (2.5 mg total) by nebulization every 4 (four) hours as needed for wheezing or shortness of breath.   apixaban 5 MG Tabs tablet Commonly known as: ELIQUIS Take 1 tablet (5 mg total) by mouth 2 (two) times daily.   budesonide-formoterol 160-4.5 MCG/ACT inhaler Commonly known as: Symbicort Inhale 2 puffs into the lungs 2 (two) times daily.   cephALEXin 500 MG capsule Commonly known as: Keflex Take 1 capsule (500 mg total) by mouth 3 (three) times daily for 5 days.   cholecalciferol 1000 units tablet Commonly known as: VITAMIN D Take 1,000 Units by mouth daily.   cyanocobalamin 1000 MCG tablet Commonly known as: CVS VITAMIN B12 Take 1 tablet (1,000 mcg total) by mouth daily.   dofetilide 250 MCG capsule Commonly known as: TIKOSYN Take 1 capsule (250 mcg total) by mouth 2 (two) times daily.   doxycycline 100 MG tablet Commonly known as: VIBRA-TABS Take 1 tablet (100 mg total) by mouth 2 (two) times daily for 5 days.   furosemide 40 MG tablet Commonly known as: LASIX Take 1 tablet (40 mg total) by mouth 2 (two) times daily.   guaiFENesin 600 MG 12 hr tablet Commonly known as: MUCINEX Take  1 tablet (600 mg total) by mouth 2 (two) times daily.   pantoprazole 40 MG tablet Commonly known as: PROTONIX Take 1 tablet (40 mg total) by mouth daily. Start taking on: February 01, 2021   polyethylene glycol 17 g packet Commonly known as: MIRALAX / GLYCOLAX Take 17 g by mouth daily as needed for mild constipation.   potassium chloride SA 20 MEQ tablet Commonly known as: KLOR-CON M Take 20 mEq by mouth daily as needed (when taking Furosemide).   predniSONE 20 MG tablet Commonly known as: DELTASONE Take 2 tablets (40 mg total) by mouth daily with breakfast for 5 days.   rosuvastatin 10 MG tablet Commonly known as: CRESTOR Take 1 tablet (10 mg total) by mouth at bedtime.   spironolactone 25 MG tablet Commonly known as: ALDACTONE Take 1 tablet (25 mg total) by mouth daily.   tamsulosin 0.4 MG Caps capsule Commonly known as: FLOMAX Take 1 capsule (0.4 mg total) by mouth daily.               Durable Medical Equipment  (From admission, onward)           Start     Ordered   01/31/21 0000  For home use only DME Nebulizer machine       Question Answer Comment  Patient needs a nebulizer to treat with the following condition COPD with acute exacerbation ([place])   Length of Need Lifetime      01/31/21 1046  Discharge Care Instructions  (From admission, onward)           Start     Ordered   01/31/21 0000  Discharge wound care:       Comments: As advised   01/31/21 1046           Major procedures and Radiology Reports - PLEASE review detailed and final reports for all details, in brief -   DG Chest 2 View  Result Date: 01/28/2021 CLINICAL DATA:  Shortness of breath. EXAM: CHEST - 2 VIEW COMPARISON:  Chest x-rays dated 01/25/2021 and 12/10/2020. FINDINGS: Stable cardiomegaly. Patchy bilateral interstitial airspace opacities are stable. Small bilateral pleural effusions versus chronic pleural thickening at the costophrenic angles. No  pneumothorax is seen. IMPRESSION: Stable chest x-ray. Patchy bilateral interstitial opacities, most likely CHF/volume overload superimposed on chronic interstitial lung disease. Electronically Signed   By: Franki Cabot M.D.   On: 01/28/2021 11:35   NM Pulmonary Perfusion  Result Date: 01/26/2021 CLINICAL DATA:  Pulmonary emboli suspected. Low/intermediate probability. Abnormal D-dimer. EXAM: NUCLEAR MEDICINE PERFUSION LUNG SCAN TECHNIQUE: Perfusion images were obtained in multiple projections after intravenous injection of radiopharmaceutical. Ventilation scans intentionally deferred if perfusion scan and chest x-ray adequate for interpretation during COVID 19 epidemic. RADIOPHARMACEUTICALS:  4.3 mCi Tc-25m MAA IV COMPARISON:  Chest radiography yesterday. FINDINGS: There are patchy subsegmental perfusion abnormalities in the mid and lower lungs bilaterally. Because of chest radiographic abnormalities in these regions, the study is of intermediate probability of pulmonary emboli. No perfusion abnormalities are seen in the upper lungs. IMPRESSION: Scattered subsegmental perfusion abnormalities in the mid and lower lungs bilaterally. There are chest radiographic abnormalities in these regions however, and therefore the findings are of intermediate probability of pulmonary emboli. Electronically Signed   By: Nelson Chimes M.D.   On: 01/26/2021 13:34   DG Chest Port 1 View  Result Date: 01/25/2021 CLINICAL DATA:  Shortness of breath. Lower extremity edema. Poor oxygen saturation. EXAM: PORTABLE CHEST 1 VIEW COMPARISON:  12/10/2020 FINDINGS: Artifact overlies the chest. There is cardiomegaly and aortic atherosclerosis. There chronic calcified pleural plaques. There is pulmonary venous hypertension, possibly with mild interstitial edema. May be small pleural effusions. No acute bone finding. IMPRESSION: Probable congestive heart failure. Cardiomegaly, pulmonary venous hypertension, mild edema and small effusions.  The patient also has chronic calcified pleural plaques. Electronically Signed   By: Nelson Chimes M.D.   On: 01/25/2021 15:33   ECHOCARDIOGRAM COMPLETE  Result Date: 01/26/2021    ECHOCARDIOGRAM REPORT   Patient Name:   JASMON GRAFFAM Date of Exam: 01/26/2021 Medical Rec #:  [id number]     Height:       68.0 in Accession #:    [id number]    Weight:       179.9 lb Date of Birth:  1945/03/23      BSA:          1.954 m Patient Age:    70 years      BP:           119/52 mmHg Patient Gender: M             HR:           62` bpm. Exam Location:  [facility] Procedure: 2D Echo, Cardiac Doppler and Color Doppler Indications:    CHF  History:        Patient has prior history of Echocardiogram examinations, most  recent 10/05/2020. CHF and Hypertrophic Cardiomyopathy, CAD,                 COPD, Arrythmias:Atrial Fibrillation; Risk Factors:Hypertension.  Sonographer:    Wenda Low Referring Phys: [id number] OLADAPO ADEFESO IMPRESSIONS  1. Left ventricular ejection fraction, by estimation, is 65 to 70%. The left ventricle has normal function. The left ventricle has no regional wall motion abnormalities. There is severe asymmetric left ventricular hypertrophy of the septal and basal segments (18 mm) consistent with known diagnosis of hypertrophic cardiomyopathy. No significant resting LVOT gradient based on limited interrogation. Left ventricular diastolic parameters are indeterminate.  2. Right ventricular systolic function is mildly reduced. The right ventricular size is mildly enlarged. There is severely elevated pulmonary artery systolic pressure. The estimated right ventricular systolic pressure is 58.5 mmHg.  3. Left atrial size was moderately dilated.  4. Right atrial size was mildly dilated.  5. The mitral valve is abnormal. Mild to moderate mitral valve regurgitation. Moderate to severe mitral stenosis. The mean mitral valve gradient is 8.0 mmHg. Severe mitral annular calcification.  6. Tricuspid valve  regurgitation is moderate to severe.  7. The aortic valve is tricuspid. There is mild calcification of the aortic valve. Aortic valve regurgitation is moderate. Mild aortic valve stenosis. Aortic regurgitation PHT measures 549 msec. Aortic valve mean gradient measures 13.0 mmHg.  8. Pulmonic valve regurgitation is moderate.  9. The inferior vena cava is dilated in size with <50% respiratory variability, suggesting right atrial pressure of 15 mmHg. Comparison(s): No significant change from prior study. Prior images reviewed side by side. FINDINGS  Left Ventricle: Left ventricular ejection fraction, by estimation, is 65 to 70%. The left ventricle has normal function. The left ventricle has no regional wall motion abnormalities. The left ventricular internal cavity size was normal in size. There is  severe asymmetric left ventricular hypertrophy of the septal and basal segments. Left ventricular diastolic parameters are indeterminate. Right Ventricle: The right ventricular size is mildly enlarged. No increase in right ventricular wall thickness. Right ventricular systolic function is mildly reduced. There is severely elevated pulmonary artery systolic pressure. The tricuspid regurgitant velocity is 3.60 m/s, and with an assumed right atrial pressure of 15 mmHg, the estimated right ventricular systolic pressure is 27.7 mmHg. Left Atrium: Left atrial size was moderately dilated. Right Atrium: Right atrial size was mildly dilated. Pericardium: There is no evidence of pericardial effusion. Mitral Valve: The mitral valve is abnormal. There is mild thickening of the mitral valve leaflet(s). There is moderate calcification of the mitral valve leaflet(s). Moderately decreased mobility of the mitral valve leaflets. Severe mitral annular calcification. Mild to moderate mitral valve regurgitation. Moderate to severe mitral valve stenosis. MV peak gradient, 21.5 mmHg. The mean mitral valve gradient is 8.0 mmHg. Tricuspid Valve:  The tricuspid valve is grossly normal. Tricuspid valve regurgitation is moderate to severe. Aortic Valve: The aortic valve is tricuspid. There is mild calcification of the aortic valve. There is mild aortic valve annular calcification. Aortic valve regurgitation is moderate. Aortic regurgitation PHT measures 549 msec. Mild aortic stenosis is present. Aortic valve mean gradient measures 13.0 mmHg. Aortic valve peak gradient measures 27.6 mmHg. Aortic valve area, by VTI measures 2.18 cm. Pulmonic Valve: The pulmonic valve was grossly normal. Pulmonic valve regurgitation is moderate. Aorta: The aortic root is normal in size and structure. Venous: The inferior vena cava is dilated in size with less than 50% respiratory variability, suggesting right atrial pressure of 15 mmHg. IAS/Shunts: No atrial level shunt  detected by color flow Doppler.  LEFT VENTRICLE PLAX 2D LVIDd:         4.00 cm   Diastology LVIDs:         2.50 cm   LV e' medial:    5.84 cm/s LV PW:         1.90 cm   LV E/e' medial:  37.5 LV IVS:        1.80 cm   LV e' lateral:   9.28 cm/s LVOT diam:     2.00 cm   LV E/e' lateral: 23.6 LV SV:         118 LV SV Index:   60 LVOT Area:     3.14 cm  RIGHT VENTRICLE RV Basal diam:  5.00 cm RV S prime:     9.89 cm/s LEFT ATRIUM            Index        RIGHT ATRIUM           Index LA diam:      4.80 cm  2.46 cm/m   RA Area:     24.50 cm LA Vol (A2C): 130.0 ml 66.54 ml/m  RA Volume:   71.80 ml  36.75 ml/m LA Vol (A4C): 74.7 ml  38.23 ml/m  AORTIC VALVE                     PULMONIC VALVE AV Area (Vmax):    2.17 cm      PV Vmax:       1.45 m/s AV Area (Vmean):   2.30 cm      PV Peak grad:  8.4 mmHg AV Area (VTI):     2.18 cm AV Vmax:           262.50 cm/s AV Vmean:          164.000 cm/s AV VTI:            0.541 m AV Peak Grad:      27.6 mmHg AV Mean Grad:      13.0 mmHg LVOT Vmax:         181.00 cm/s LVOT Vmean:        120.000 cm/s LVOT VTI:          0.375 m LVOT/AV VTI ratio: 0.69 AI PHT:            549 msec   AORTA Ao Root diam: 2.90 cm Ao Asc diam:  3.40 cm MITRAL VALVE                TRICUSPID VALVE MV Area (PHT): 2.42 cm     TR Peak grad:   51.8 mmHg MV Area VTI:   2.19 cm     TR Vmax:        360.00 cm/s MV Peak grad:  21.5 mmHg MV Mean grad:  8.0 mmHg     SHUNTS MV Vmax:       2.32 m/s     Systemic VTI:  0.38 m MV Vmean:      126.0 cm/s   Systemic Diam: 2.00 cm MV Decel Time: 313 msec MV E velocity: 219.00 cm/s Rozann Lesches MD Electronically signed by Rozann Lesches MD Signature Date/Time: 01/26/2021/3:47:10 PM    Final     Micro Results   Recent Results (from the past 240 hour(s))  Resp Panel by RT-PCR (Flu A&B, Covid) Nasopharyngeal Swab     Status: None   Collection Time: 01/25/21  5:20 PM   Specimen: Nasopharyngeal Swab; Nasopharyngeal(NP) swabs in vial transport medium  Result Value Ref Range Status   SARS Coronavirus 2 by RT PCR NEGATIVE NEGATIVE Final    Comment: (NOTE) SARS-CoV-2 target nucleic acids are NOT DETECTED.  The SARS-CoV-2 RNA is generally detectable in upper respiratory specimens during the acute phase of infection. The lowest concentration of SARS-CoV-2 viral copies this assay can detect is 138 copies/mL. A negative result does not preclude SARS-Cov-2 infection and should not be used as the sole basis for treatment or other patient management decisions. A negative result may occur with  improper specimen collection/handling, submission of specimen other than nasopharyngeal swab, presence of viral mutation(s) within the areas targeted by this assay, and inadequate number of viral copies(<138 copies/mL). A negative result must be combined with clinical observations, patient history, and epidemiological information. The expected result is Negative.  Fact Sheet for Patients:  EntrepreneurPulse.com.au  Fact Sheet for Healthcare Providers:  IncredibleEmployment.be  This test is no t yet approved or cleared by the [place] FDA  and  has been authorized for detection and/or diagnosis of SARS-CoV-2 by FDA under an Emergency Use Authorization (EUA). This EUA will remain  in effect (meaning this test can be used) for the duration of the COVID-19 declaration under Section 564(b)(1) of the Act, 21 U.S.C.section 360bbb-3(b)(1), unless the authorization is terminated  or revoked sooner.       Influenza A by PCR NEGATIVE NEGATIVE Final   Influenza B by PCR NEGATIVE NEGATIVE Final    Comment: (NOTE) The Xpert Xpress SARS-CoV-2/FLU/RSV plus assay is intended as an aid in the diagnosis of influenza from Nasopharyngeal swab specimens and should not be used as a sole basis for treatment. Nasal washings and aspirates are unacceptable for Xpert Xpress SARS-CoV-2/FLU/RSV testing.  Fact Sheet for Patients: EntrepreneurPulse.com.au  Fact Sheet for Healthcare Providers: IncredibleEmployment.be  This test is not yet approved or cleared by the [place] FDA and has been authorized for detection and/or diagnosis of SARS-CoV-2 by FDA under an Emergency Use Authorization (EUA). This EUA will remain in effect (meaning this test can be used) for the duration of the COVID-19 declaration under Section 564(b)(1) of the Act, 21 U.S.C. section 360bbb-3(b)(1), unless the authorization is terminated or revoked.  Performed at [facility], [address], [place], [place] [address]   MRSA Next Gen by PCR, Nasal     Status: Abnormal   Collection Time: 01/28/21  7:21 PM   Specimen: Nasal Mucosa; Nasal Swab  Result Value Ref Range Status   MRSA by PCR Next Gen DETECTED (A) NOT DETECTED Final    Comment: CRITICAL RESULT CALLED TO, READ BACK BY AND VERIFIED WITH: ERTEL,A 0027 01/29/2021 COLEMAN,R (NOTE) The GeneXpert MRSA Assay (FDA approved for NASAL specimens only), is one component of a comprehensive MRSA colonization surveillance program. It is not intended to diagnose MRSA infection nor to  guide or monitor treatment for MRSA infections. Test performance is not FDA approved in patients less than 58 years old. Performed at [facility], [address] [place], [place] [address]    Today   Subjective    Abbie Jablon today has no new complaints  No fever  Or chills   No Nausea, Vomiting or Diarrhea        Patient has been seen and examined prior to discharge   Objective   Blood pressure (!) 139/50, pulse (!) 110, temperature 98 F (36.7 C), temperature source Oral, resp. rate 19, height 5'  8" (1.727 m), weight 74.7 kg, SpO2 98 %.   Intake/Output Summary (Last 24 hours) at 01/31/2021 1059 Last data filed at 01/31/2021 0900 Gross per 24 hour  Intake 680 ml  Output 3200 ml  Net -2520 ml    Exam Gen:- Awake Alert, no acute distress  HEENT:- [employer].AT, No sclera icterus Neck-Supple Neck,No JVD,.  Lungs-improved air movement, no wheezing CV- S1, S2 normal, regular, 3/6 SM Abd-  +ve B.Sounds, Abd Soft, No tenderness,    Extremity/Skin:- Trace edema,   good pulses Psych-affect is appropriate, oriented x3 Neuro-generalized weakness, no new focal deficits, no tremors    Data Review   CBC w Diff:  Lab Results  Component Value Date   WBC 7.3 01/30/2021   HGB 9.3 (L) 01/30/2021   HGB 11.6 (L) 12/23/2020   HCT 31.8 (L) 01/30/2021   HCT 36.5 (L) 12/23/2020   PLT 130 (L) 01/30/2021   PLT 115 (L) 12/23/2020   LYMPHOPCT 10 01/25/2021   MONOPCT 9 01/25/2021   EOSPCT 0 01/25/2021   BASOPCT 0 01/25/2021    CMP:  Lab Results  Component Value Date   NA 137 01/30/2021   NA 144 01/12/2021   K 3.7 01/30/2021   CL 80 (L) 01/30/2021   CO2 >45 (H) 01/30/2021   BUN 48 (H) 01/30/2021   BUN 30 (H) 01/12/2021   CREATININE 1.02 01/30/2021   CREATININE 1.10 08/20/2012   PROT 5.9 (L) 01/26/2021   PROT 6.3 12/23/2020   ALBUMIN 2.8 (L) 01/26/2021   ALBUMIN 3.5 (L) 12/23/2020   BILITOT 1.2 01/26/2021   BILITOT 1.4 (H) 12/23/2020   ALKPHOS 48 01/26/2021   AST 20 01/26/2021    ALT 13 01/26/2021  .   Total Discharge time is about 33 minutes  Roxan Hockey M.D on 01/31/2021 at 10:59 AM  Go to [email or web address] -  for contact info  [facility] - Office  [phone]

## 2021-01-31 NOTE — Progress Notes (Signed)
Physical Therapy Treatment Patient Details Name: Joshua [name] MRN: [id number] DOB: Aug 04, 1945 Today's Date: 01/31/2021   History of Present Illness Joshua [name] is a 75 y.o. male who presents to the emergency department due to several days of worsening increasing shortness of breath, BLE swelling. PMH: HFpEF, CAD s/p prior PCI to the LAD and RCA, hypertension, COPD on home oxygen (3L), persistent atrial fibrillation/flutter, recent GI bleed due to internal hemorrhoids (July 2022) and on Eliquis for CVA prevention    PT Comments    Pt sits up EOB requiring seated rest break after mobilizing, reports feeling "weird", afib noted on heart monitor 60-80s. Educated pt on hand placement with transfers, able to power to stand with UE assisting. Pt ambulates 20 ft then requests seated rest break due to BLE weakness; able to ambulate additional 80 ft with RW, step through pattern, good steadiness and no BLE buckling noted. Pt on 3L with SpO2 91-95%. Educated pt on pursed lip breathing with mobility with good carryover. Answered questions and provided education regarding mobility at home to pt and family at bedside. RN notified therapist EOS, SpO2 and pt ready to get dressed for d/c.    Recommendations for follow up therapy are one component of a multi-disciplinary discharge planning process, led by the attending physician.  Recommendations may be updated based on patient status, additional functional criteria and insurance authorization.  Follow Up Recommendations  Home health PT     Assistance Recommended at Discharge Intermittent Supervision/Assistance  Equipment Recommendations  None recommended by PT    Recommendations for Other Services       Precautions / Restrictions Precautions Precautions: Fall Precaution Comments: monitor O2 Restrictions Weight Bearing Restrictions: No     Mobility  Bed Mobility Overal bed mobility: Needs Assistance Bed Mobility: Supine to Sit  Supine to sit:  Supervision  General bed mobility comments: increased time, use of bedrail to upright trunk    Transfers Overall transfer level: Needs assistance Equipment used: Rolling walker (2 wheels) Transfers: Sit to/from Stand Sit to Stand: Supervision  General transfer comment: VC for hand placement to push from seated surface, supv for safety    Ambulation/Gait Ambulation/Gait assistance: Supervision Gait Distance (Feet): 80 Feet (+additional 20 ft) Assistive device: Rolling walker (2 wheels) Gait Pattern/deviations: Step-through pattern;Decreased stride length;Trunk flexed Gait velocity: decreased  General Gait Details: step through pattern, good steadiness, maintains trunk flexed, limited by SOB and BLE weakness complaints, on 3L O2 with SpO2 91-95%   Stairs             Wheelchair Mobility    Modified Rankin (Stroke Patients Only)       Balance Overall balance assessment: Needs assistance Sitting-balance support: Feet supported Sitting balance-Leahy Scale: Good Sitting balance - Comments: seated at EOB  Standing balance support: During functional activity;Bilateral upper extremity supported;Reliant on assistive device for balance Standing balance-Leahy Scale: Poor       Cognition Arousal/Alertness: Awake/alert Behavior During Therapy: WFL for tasks assessed/performed Overall Cognitive Status: Within Functional Limits for tasks assessed     Exercises      General Comments        Pertinent Vitals/Pain Pain Assessment: No/denies pain    Home Living                          Prior Function            PT Goals (current goals can now be found in the care plan  section) Acute Rehab PT Goals Patient Stated Goal: return home with family/caregiver to assist PT Goal Formulation: With patient/family Time For Goal Achievement: 02/03/21 Potential to Achieve Goals: Good Progress towards PT goals: Progressing toward goals    Frequency    Min  3X/week      PT Plan Current plan remains appropriate    Co-evaluation              AM-PAC PT "6 Clicks" Mobility   Outcome Measure  Help needed turning from your back to your side while in a flat bed without using bedrails?: None Help needed moving from lying on your back to sitting on the side of a flat bed without using bedrails?: A Little Help needed moving to and from a bed to a chair (including a wheelchair)?: A Little Help needed standing up from a chair using your arms (e.g., wheelchair or bedside chair)?: A Little Help needed to walk in hospital room?: A Little Help needed climbing 3-5 steps with a railing? : A Little 6 Click Score: 19    End of Session Equipment Utilized During Treatment: Oxygen Activity Tolerance: Patient limited by fatigue;Patient tolerated treatment well Patient left: in bed;with call bell/phone within reach;with family/visitor present (seated EOB with spouse at bedside) Nurse Communication: Mobility status;Other (comment) (pt ready to get dressed) PT Visit Diagnosis: Unsteadiness on feet (R26.81);Other abnormalities of gait and mobility (R26.89);Muscle weakness (generalized) (M62.81)     Time: 3112-1624 PT Time Calculation (min) (ACUTE ONLY): 32 min  Charges:  $Gait Training: 8-22 mins $Therapeutic Activity: 8-22 mins                      Tori Kelcie Currie PT, DPT 01/31/21, 2:49 PM

## 2021-01-31 NOTE — Progress Notes (Signed)
During bedside report, patient was on BIPAP due to elevate pCO2 level this morning. It was also stated that patient refused BIPAP at bedtime last night. ABG was redrawn this morning and pCO2 was 100 and Dr. Denton Brick made aware. Patient was taken off BIPAP by Roselyn Reef, RRT and placed on baseline 3L for patient to eat breakfast. Patient was placed back on BIPAP by [facility], RRT to decrease pCO2 level. Dr. Denton Brick aware of patient still on BIPAP.

## 2021-01-31 NOTE — Discharge Instructions (Addendum)
1)Very low-salt diet advised 2)Weigh yourself daily, call if you gain more than 3 pounds in 1 day or more than 5 pounds in 1 week as your diuretic medications may need to be adjusted 3)Limit your Fluid  intake to no more than 60 ounces (1.8 Liters) per day 4)You are taking Eliquis/Apixaban which is a Blood Thinner so Please Avoid ibuprofen/Advil/Aleve/Motrin/Goody Powders/Naproxen/BC powders/Meloxicam/Diclofenac/Indomethacin and other Nonsteroidal anti-inflammatory medications as these will make you more likely to bleed and can cause stomach ulcers, can also cause Kidney problems.  5)Please use NIV/TRilogy machine every time you go to sleep or take a nap 6)you need oxygen at home at 3 L via nasal cannula continuously while awake and while asleep--- smoking or having open fires around oxygen can cause fire, significant injury and death

## 2021-01-31 NOTE — Progress Notes (Signed)
   01/31/21 0540  Provider Notification  Provider Name/Title Dr. Josephine Cables  Date Provider Notified 01/31/21  Time Provider Notified [phone]  Notification Type  (secure chat)  Notification Reason Critical result  Test performed and critical result [id number] [id number]  Date Critical Result Received 01/31/21  Time Critical Result Received [phone]

## 2021-02-01 ENCOUNTER — Telehealth: Payer: Self-pay | Admitting: Family Medicine

## 2021-02-01 NOTE — Telephone Encounter (Signed)
Transition Care Management Unsuccessful Follow-up Telephone Call  Date of discharge and from where:  01/31/21 - Joshua [name] - CHF exacerbation  Attempts:  1st Attempt  Reason for unsuccessful TCM follow-up call:  Left voice message on patient's primary number - daughter left msg this morning asking we call that number back   Transition Care Management Unsuccessful Follow-up Telephone Call  Date of discharge and from where:  12/6 Joshua [name] - CHF  Attempts:  2nd Attempt  Reason for unsuccessful TCM follow-up call:  Left voice message on daughters phone

## 2021-02-01 NOTE — Telephone Encounter (Signed)
Pt needs a hospital follow up. Discharged 12/6. Please call back.

## 2021-02-01 NOTE — Telephone Encounter (Signed)
Transition Care Management Follow-up Telephone Call Date of discharge and from where: 01/31/21 - [facility] - [facility] How have you been since you were released from the hospital?  Doing okay, better today Any questions or concerns? No  Items Reviewed: Did the pt receive and understand the discharge instructions provided? Yes  Medications obtained and verified? Yes  Other? No  Any new allergies since your discharge? No  Dietary orders reviewed? Yes Do you have support at home? Yes   Home Care and Equipment/Supplies: Were home health services ordered? yes If so, what is the name of the agency? [facility]  Has the agency set up a time to come to the patient's home? yes Were any new equipment or medical supplies ordered?  Yes: Trilogy machine What is the name of the medical supply agency? [employer] Were you able to get the supplies/equipment? yes Do you have any questions related to the use of the equipment or supplies? No  Functional Questionnaire: (I = Independent and D = Dependent) ADLs: I  Bathing/Dressing- I  Meal Prep- D  Eating- I  Maintaining continence- I  Transferring/Ambulation- I  Managing Meds- D  Follow up appointments reviewed:  PCP Hospital f/u appt confirmed? Yes  Scheduled to see [name] on 02/09/21 @ 11:10. [facility] f/u appt confirmed? No   Are transportation arrangements needed? No  If their condition worsens, is the pt aware to call PCP or go to the Emergency Dept.? Yes Was the patient provided with contact information for the PCP's office or ED? Yes Was to pt encouraged to call back with questions or concerns? Yes

## 2021-02-09 ENCOUNTER — Telehealth: Payer: Self-pay

## 2021-02-09 ENCOUNTER — Encounter: Payer: Self-pay | Admitting: Family Medicine

## 2021-02-09 ENCOUNTER — Ambulatory Visit (INDEPENDENT_AMBULATORY_CARE_PROVIDER_SITE_OTHER): Payer: Medicare PPO | Admitting: Family Medicine

## 2021-02-09 VITALS — BP 111/48 | HR 63 | Ht 68.0 in | Wt 165.0 lb

## 2021-02-09 DIAGNOSIS — I5043 Acute on chronic combined systolic (congestive) and diastolic (congestive) heart failure: Secondary | ICD-10-CM

## 2021-02-09 DIAGNOSIS — F02818 Dementia in other diseases classified elsewhere, unspecified severity, with other behavioral disturbance: Secondary | ICD-10-CM

## 2021-02-09 DIAGNOSIS — I5033 Acute on chronic diastolic (congestive) heart failure: Secondary | ICD-10-CM

## 2021-02-09 MED ORDER — SERTRALINE HCL 50 MG PO TABS: 50.0000 mg | ORAL_TABLET | Freq: Every day | ORAL | 5 refills | Status: AC

## 2021-02-09 NOTE — Telephone Encounter (Signed)
Pt's daughter has been informed and understood. She will call back if needed.

## 2021-02-09 NOTE — Progress Notes (Signed)
BP (!) 111/48    Pulse 63    Ht 5' 8" (1.727 m)    Wt 165 lb (74.8 kg)    SpO2 94%    BMI 25.09 kg/m    Subjective:   Patient ID: Joshua [name], male    DOB: [birth year], 75 y.o.   MRN: [id number]  HPI: Joshua [name] is a 75 y.o. male presenting on 02/09/2021 for [facility]   HPI Transition Care Management Follow-up Telephone Call Date of discharge and from where: 01/31/21 - [facility] - [facility] How have you been since you were released from the hospital?  Doing okay, better today Any questions or concerns? No Contacted by Adalberto Cole on 02/01/2021   Transitional care office visit Patient was in the hospital on 01/25/2021 and discharged on 01/31/2021 for congestive heart failure.  He is on oxygen 3 L when he is awake and has a BiPAP machine for when he sleeps.  His CHF classified as stage IV or end-stage and has been in and out of the hospital.  They are trying to help manage it to keep him out of the hospital.  He also has significant dementia in the moderate category.  Discussed with family the possibility of going to hospice care and so they were already contacted by palliative care complete and she is going to let us know what company it is.  His breathing and swelling have been better since leaving the hospital and he is weight is down from where it was prior to the hospitalization.  Relevant past medical, surgical, family and social history reviewed and updated as indicated. Interim medical history since our last visit reviewed. Allergies and medications reviewed and updated.  Review of Systems  Constitutional:  Negative for chills and fever.  Eyes:  Negative for visual disturbance.  Respiratory:  Positive for shortness of breath. Negative for cough, chest tightness and wheezing.   Cardiovascular:  Positive for palpitations and leg swelling. Negative for chest pain.  Musculoskeletal:  Negative for back pain and gait problem.  Skin:  Negative for rash.  Neurological:  Negative for  dizziness, weakness and numbness.  All other systems reviewed and are negative.  Per HPI unless specifically indicated above   Allergies as of 02/09/2021   No Known Allergies      Medication List        Accurate as of February 09, 2021 12:02 PM. If you have any questions, ask your nurse or doctor.          acetaminophen 325 MG tablet Commonly known as: TYLENOL Take 2 tablets (650 mg total) by mouth every 6 (six) hours as needed for mild pain, moderate pain or fever.   albuterol (2.5 MG/3ML) 0.083% nebulizer solution Commonly known as: PROVENTIL Take 3 mLs (2.5 mg total) by nebulization every 4 (four) hours as needed for wheezing or shortness of breath.   apixaban 5 MG Tabs tablet Commonly known as: ELIQUIS Take 1 tablet (5 mg total) by mouth 2 (two) times daily.   budesonide-formoterol 160-4.5 MCG/ACT inhaler Commonly known as: Symbicort Inhale 2 puffs into the lungs 2 (two) times daily.   cholecalciferol 1000 units tablet Commonly known as: VITAMIN D Take 1,000 Units by mouth daily.   cyanocobalamin 1000 MCG tablet Commonly known as: CVS VITAMIN B12 Take 1 tablet (1,000 mcg total) by mouth daily.   dofetilide 250 MCG capsule Commonly known as: TIKOSYN Take 1 capsule (250 mcg total) by mouth 2 (two) times daily.  furosemide 40 MG tablet °Commonly known as: LASIX °Take 1 tablet (40 mg total) by mouth 2 (two) times daily. °  °guaiFENesin 600 MG 12 hr tablet °Commonly known as: MUCINEX °Take 1 tablet (600 mg total) by mouth 2 (two) times daily. °  °pantoprazole 40 MG tablet °Commonly known as: PROTONIX °Take 1 tablet (40 mg total) by mouth daily. °  °polyethylene glycol 17 g packet °Commonly known as: MIRALAX / GLYCOLAX °Take 17 g by mouth daily as needed for mild constipation. °  °potassium chloride SA 20 MEQ tablet °Commonly known as: KLOR-CON M °Take 20 mEq by mouth daily as needed (when taking Furosemide). °  °rosuvastatin 10 MG tablet °Commonly known as:  CRESTOR °Take 1 tablet (10 mg total) by mouth at bedtime. °  °spironolactone 25 MG tablet °Commonly known as: ALDACTONE °Take 1 tablet (25 mg total) by mouth daily. °  °tamsulosin 0.4 MG Caps capsule °Commonly known as: FLOMAX °Take 1 capsule (0.4 mg total) by mouth daily. °  ° °  ° ° ° °Objective:  ° °BP (!) 111/48    Pulse 63    Ht 5' 8" (1.727 m)    Wt 165 lb (74.8 kg)    SpO2 94%    BMI 25.09 kg/m²   °Wt Readings from Last 3 Encounters:  °02/09/21 165 lb (74.8 kg)  °01/31/21 164 lb 10.9 oz (74.7 kg)  °01/25/21 180 lb (81.6 kg)  °  °Physical Exam °Vitals and nursing note reviewed.  °Constitutional:   °   General: He is not in acute distress. °   Appearance: He is well-developed. He is not diaphoretic.  °Eyes:  °   General: No scleral icterus. °   Conjunctiva/sclera: Conjunctivae normal.  °Neck:  °   Thyroid: No thyromegaly.  °Cardiovascular:  °   Rate and Rhythm: Normal rate and regular rhythm.  °   Heart sounds: Murmur heard.  °Pulmonary:  °   Effort: Pulmonary effort is normal. No respiratory distress.  °   Breath sounds: Normal breath sounds. No wheezing.  °Musculoskeletal:     °   General: Swelling (3+ edema bilateral lower extremities) present. Normal range of motion.  °   Cervical back: Neck supple.  °Lymphadenopathy:  °   Cervical: No cervical adenopathy.  °Skin: °   General: Skin is warm and dry.  °   Findings: No rash.  °Neurological:  °   Mental Status: He is alert and oriented to person, place, and time.  °   Coordination: Coordination normal.  °Psychiatric:     °   Behavior: Behavior normal.  ° ° ° ° °Assessment & Plan:  ° °Problem List Items Addressed This Visit   ° °  ° Cardiovascular and Mediastinum  ° Acute on chronic diastolic (congestive) heart failure/Hypertrophic Cardiomyopathy  ° Relevant Orders  ° Ambulatory referral to Hospice  ° Acute on chronic combined systolic and diastolic CHF, NYHA class 4 (HCC)  ° Relevant Orders  ° Ambulatory referral to Hospice  ° Acute exacerbation of CHF  (congestive heart failure) (HCC)  ° Relevant Orders  ° Ambulatory referral to Hospice  ° °Other Visit Diagnoses   ° ° Acute on chronic combined systolic and diastolic CHF (congestive heart failure) (HCC)    -  Primary  ° Relevant Orders  ° CBC with Differential/Platelet  ° CMP14+EGFR  ° Ambulatory referral to Hospice  ° °  °  °Continue current medicine, will check blood work.  Placed referral to hospice °Follow   up plan: °Return if symptoms worsen or fail to improve. ° °Counseling provided for all of the vaccine components °Orders Placed This Encounter  °Procedures  ° CBC with Differential/Platelet  ° CMP14+EGFR  ° Ambulatory referral to Hospice  ° ° ° ° , MD °[facility] °02/09/2021, 12:02 PM ° ° ° ° °

## 2021-02-09 NOTE — Telephone Encounter (Signed)
Sent Zoloft for them, it is once a day take it in the morning, it may take a couple weeks to start working

## 2021-02-09 NOTE — Telephone Encounter (Signed)
Daughter called and states that she is fine with pt starting medication for mood.

## 2021-02-10 ENCOUNTER — Telehealth: Payer: Self-pay | Admitting: Family Medicine

## 2021-02-10 LAB — CBC WITH DIFFERENTIAL/PLATELET
Basophils Absolute: 0 10*3/uL (ref 0.0–0.2)
Basos: 0 %
EOS (ABSOLUTE): 0 10*3/uL (ref 0.0–0.4)
Eos: 0 %
Hematocrit: 27.3 % — ABNORMAL LOW (ref 37.5–51.0)
Hemoglobin: 8.6 g/dL — CL (ref 13.0–17.7)
Immature Grans (Abs): 0 10*3/uL (ref 0.0–0.1)
Immature Granulocytes: 0 %
Lymphocytes Absolute: 0.4 10*3/uL — ABNORMAL LOW (ref 0.7–3.1)
Lymphs: 6 %
MCH: 30 pg (ref 26.6–33.0)
MCHC: 31.5 g/dL (ref 31.5–35.7)
MCV: 95 fL (ref 79–97)
Monocytes Absolute: 0.5 10*3/uL (ref 0.1–0.9)
Monocytes: 6 %
Neutrophils Absolute: 6.4 10*3/uL (ref 1.4–7.0)
Neutrophils: 88 %
Platelets: 133 10*3/uL — ABNORMAL LOW (ref 150–450)
RBC: 2.87 x10E6/uL — ABNORMAL LOW (ref 4.14–5.80)
RDW: 14.4 % (ref 11.6–15.4)
WBC: 7.3 10*3/uL (ref 3.4–10.8)

## 2021-02-10 LAB — CMP14+EGFR
ALT: 21 IU/L (ref 0–44)
AST: 27 IU/L (ref 0–40)
Albumin/Globulin Ratio: 1.5 (ref 1.2–2.2)
Albumin: 3.6 g/dL — ABNORMAL LOW (ref 3.7–4.7)
Alkaline Phosphatase: 76 IU/L (ref 44–121)
BUN/Creatinine Ratio: 34 — ABNORMAL HIGH (ref 10–24)
BUN: 42 mg/dL — ABNORMAL HIGH (ref 8–27)
Bilirubin Total: 1.5 mg/dL — ABNORMAL HIGH (ref 0.0–1.2)
CO2: 36 mmol/L — ABNORMAL HIGH (ref 20–29)
Calcium: 9.1 mg/dL (ref 8.6–10.2)
Chloride: 96 mmol/L (ref 96–106)
Creatinine, Ser: 1.25 mg/dL (ref 0.76–1.27)
Globulin, Total: 2.4 g/dL (ref 1.5–4.5)
Glucose: 115 mg/dL — ABNORMAL HIGH (ref 70–99)
Potassium: 5.1 mmol/L (ref 3.5–5.2)
Sodium: 146 mmol/L — ABNORMAL HIGH (ref 134–144)
Total Protein: 6 g/dL (ref 6.0–8.5)
eGFR: 60 mL/min/{1.73_m2} (ref >59)

## 2021-02-10 NOTE — Telephone Encounter (Signed)
This is a slight decrease from hospitalization.  Dr [name] has referred to hospice care as of yesterday.

## 2021-02-10 NOTE — Telephone Encounter (Signed)
Otila Kluver called from [employer] to give Critical Lab Result-  Hemoglobin:  8.6

## 2021-02-14 ENCOUNTER — Telehealth: Payer: Self-pay

## 2021-02-14 NOTE — Chronic Care Management (AMB) (Signed)
Chronic Care Management   Note  02/14/2021 Name: MISTY RAGO MRN: [id number] DOB: [birth year]  BRAEDYN KAUK is a 75 y.o. year old male who is a primary care patient of [name], [name], MD. I reached out to [name] by phone today in response to a referral sent by Mr. JABREE REBERT PCP.  Mr. Morison was given information about Chronic Care Management services today including:  CCM service includes personalized support from designated clinical staff supervised by his physician, including individualized plan of care and coordination with other care providers 24/7 contact phone numbers for assistance for urgent and routine care needs. Service will only be billed when office clinical staff spend 20 minutes or more in a month to coordinate care. Only one practitioner may furnish and bill the service in a calendar month. The patient may stop CCM services at any time (effective at the end of the month) by phone call to the office staff. The patient is responsible for co-pay (up to 20% after annual deductible is met) if co-pay is required by the individual health plan.   Patient agreed to services and verbal consent obtained.   Follow up plan: Telephone appointment with care management team member scheduled for:03/06/2021  Noreene Larsson, [place], [place], [place] [address] Direct Dial: [phone] [email or web address] Website:  [email or web address]

## 2021-02-17 ENCOUNTER — Telehealth: Payer: Self-pay | Admitting: Family Medicine

## 2021-02-17 NOTE — Telephone Encounter (Signed)
Tried calling daughter. No answer and no machine. Yvone Neu has made son aware of results today.

## 2021-02-22 DIAGNOSIS — Z515 Encounter for palliative care: Secondary | ICD-10-CM | POA: Diagnosis not present

## 2021-02-22 DIAGNOSIS — I5032 Chronic diastolic (congestive) heart failure: Secondary | ICD-10-CM | POA: Diagnosis not present

## 2021-02-22 DIAGNOSIS — D500 Iron deficiency anemia secondary to blood loss (chronic): Secondary | ICD-10-CM | POA: Diagnosis not present

## 2021-03-01 DIAGNOSIS — W19XXXA Unspecified fall, initial encounter: Secondary | ICD-10-CM | POA: Diagnosis not present

## 2021-03-01 DIAGNOSIS — R404 Transient alteration of awareness: Secondary | ICD-10-CM | POA: Diagnosis not present

## 2021-03-01 DIAGNOSIS — R58 Hemorrhage, not elsewhere classified: Secondary | ICD-10-CM | POA: Diagnosis not present

## 2021-03-06 ENCOUNTER — Telehealth: Payer: Medicare PPO

## 2021-03-29 DIAGNOSIS — 419620001 Death: Secondary | SNOMED CT | POA: Diagnosis not present

## 2021-03-29 DEATH — deceased

## 2021-09-15 ENCOUNTER — Ambulatory Visit: Payer: Medicare PPO

## 2022-07-12 IMAGING — DX DG CHEST 1V PORT
1 series · 1 of 1 positions shown · non-contrast
Comparison: Chest x-rays dated 11/21/2020 and 05/11/2019.

CLINICAL DATA: Shortness of breath

EXAM:
PORTABLE CHEST 1 VIEW

[chest ap]
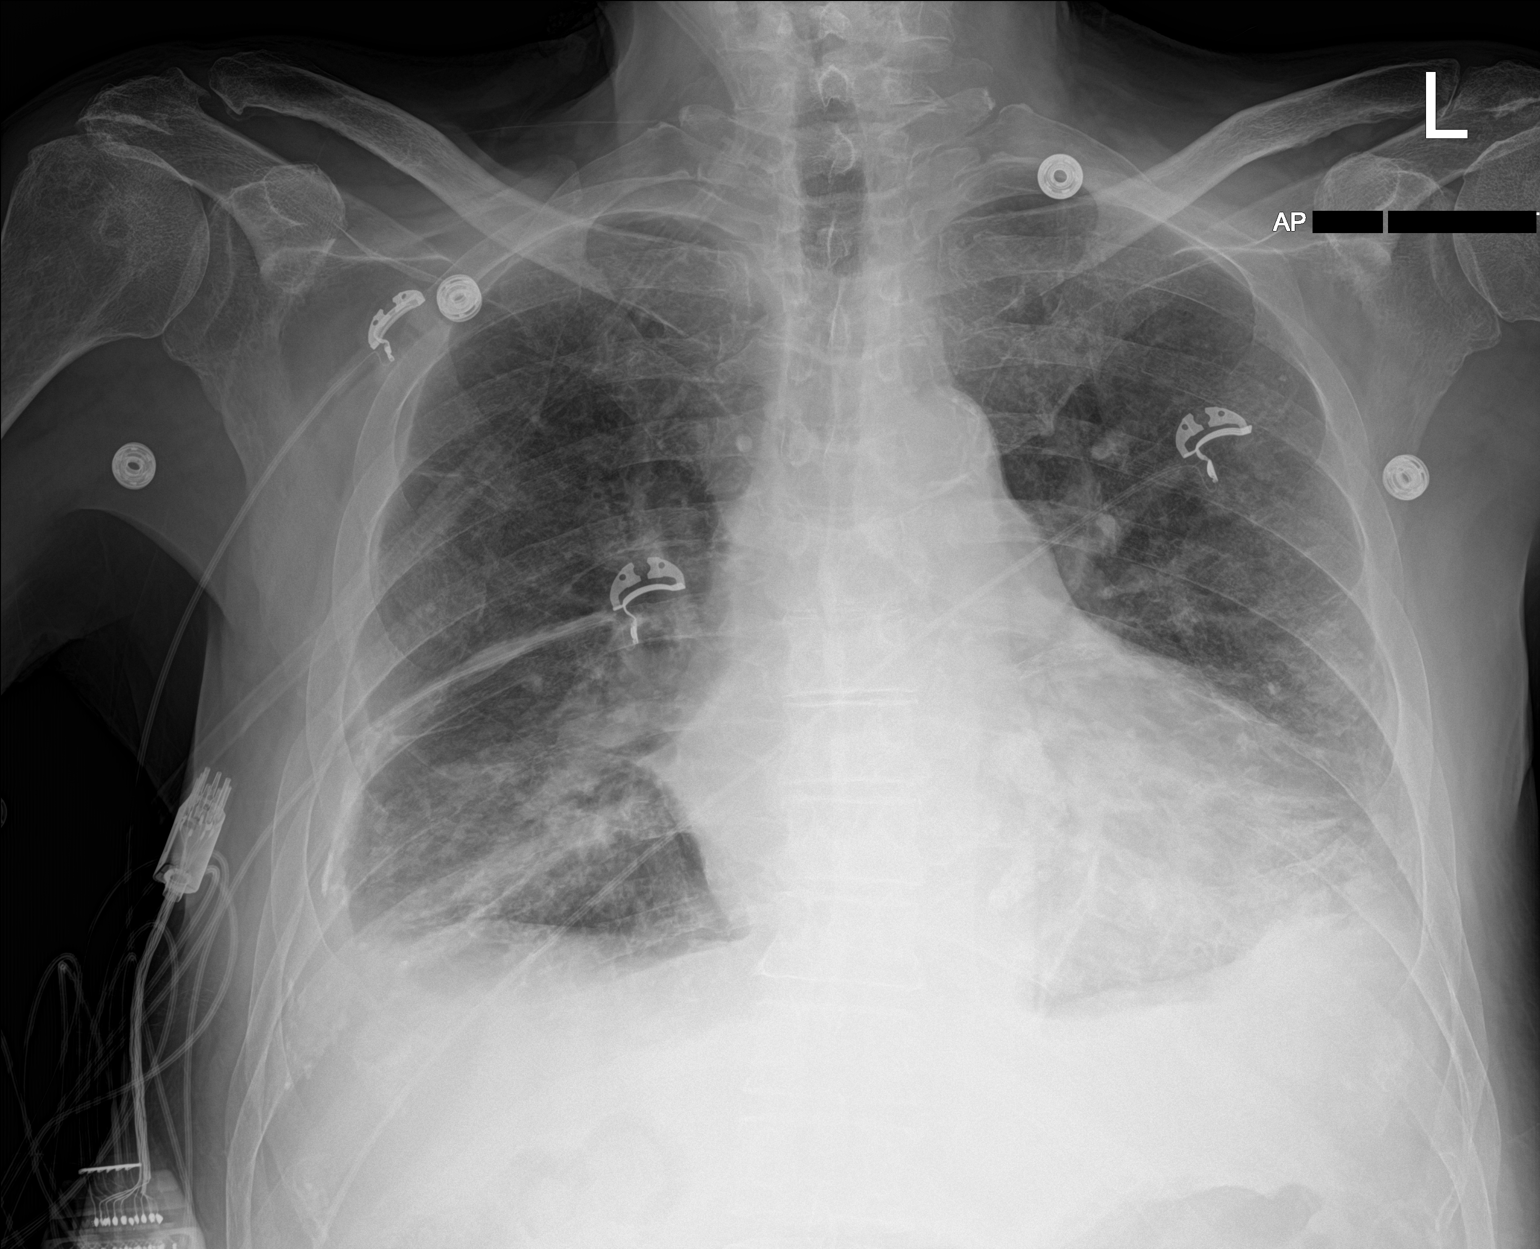

[1 of 1 positions shown; findings below may reference images not displayed]

FINDINGS: Chronic bibasilar opacities without significant change. No new lung
findings. No evidence of pleural effusion or pneumothorax is seen.
Heart size and mediastinal contours are stable. Osseous structures
about the chest are unremarkable.
IMPRESSION: 1. No active disease. No evidence of pneumonia or pulmonary edema.
2. Stable chronic bibasilar opacities, likely chronic atelectasis
and/or scarring.

## 2022-07-17 IMAGING — DX DG ELBOW COMPLETE 3+V*R*
5 series · 5 of 5 positions shown · non-contrast
Comparison: None.

CLINICAL DATA: Fall

EXAM:
RIGHT ELBOW - COMPLETE 3+ VIEW

[elbow ap]
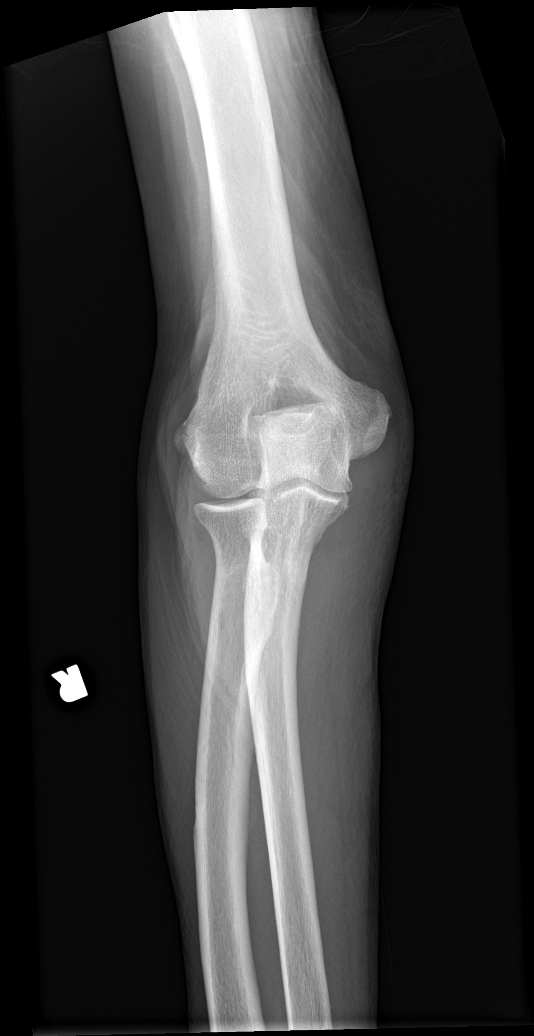

[elbow obl (1 of 3)]
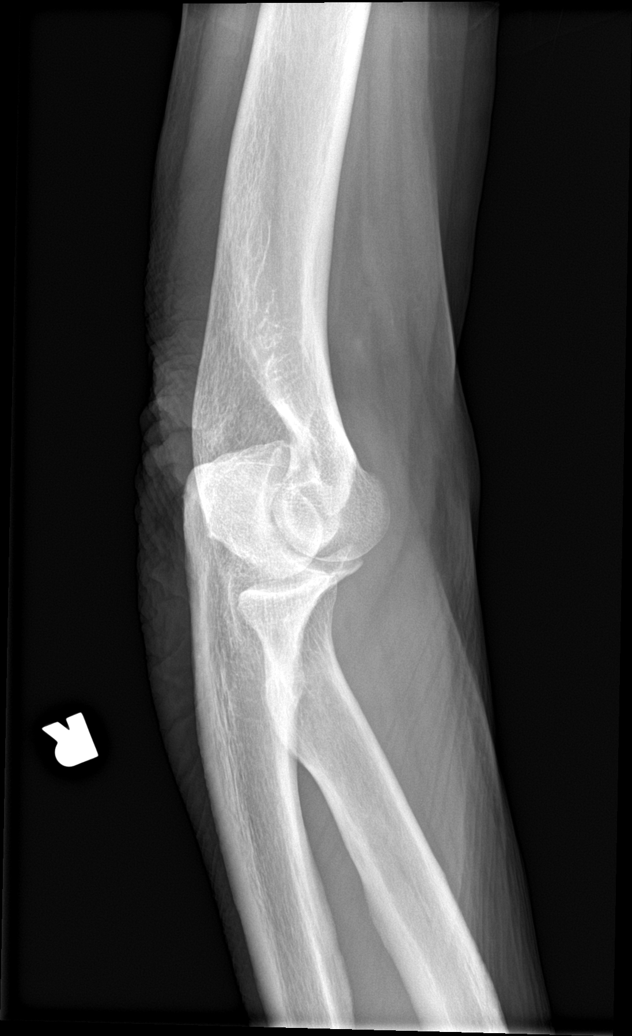

[elbow obl (2 of 3)]
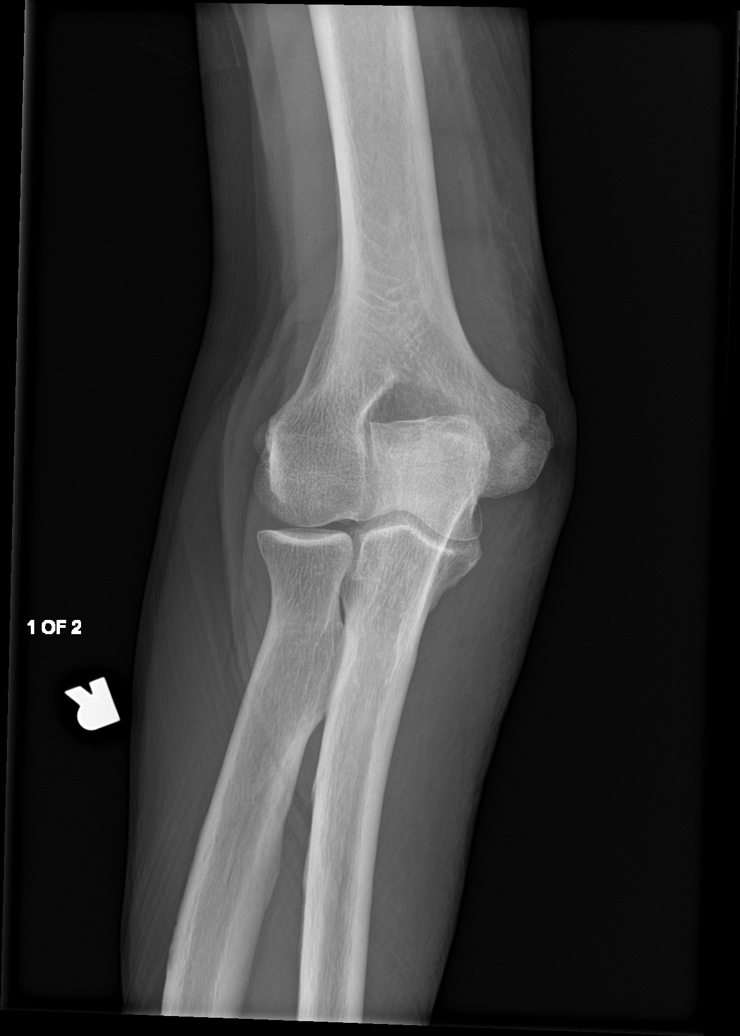

[elbow lat]
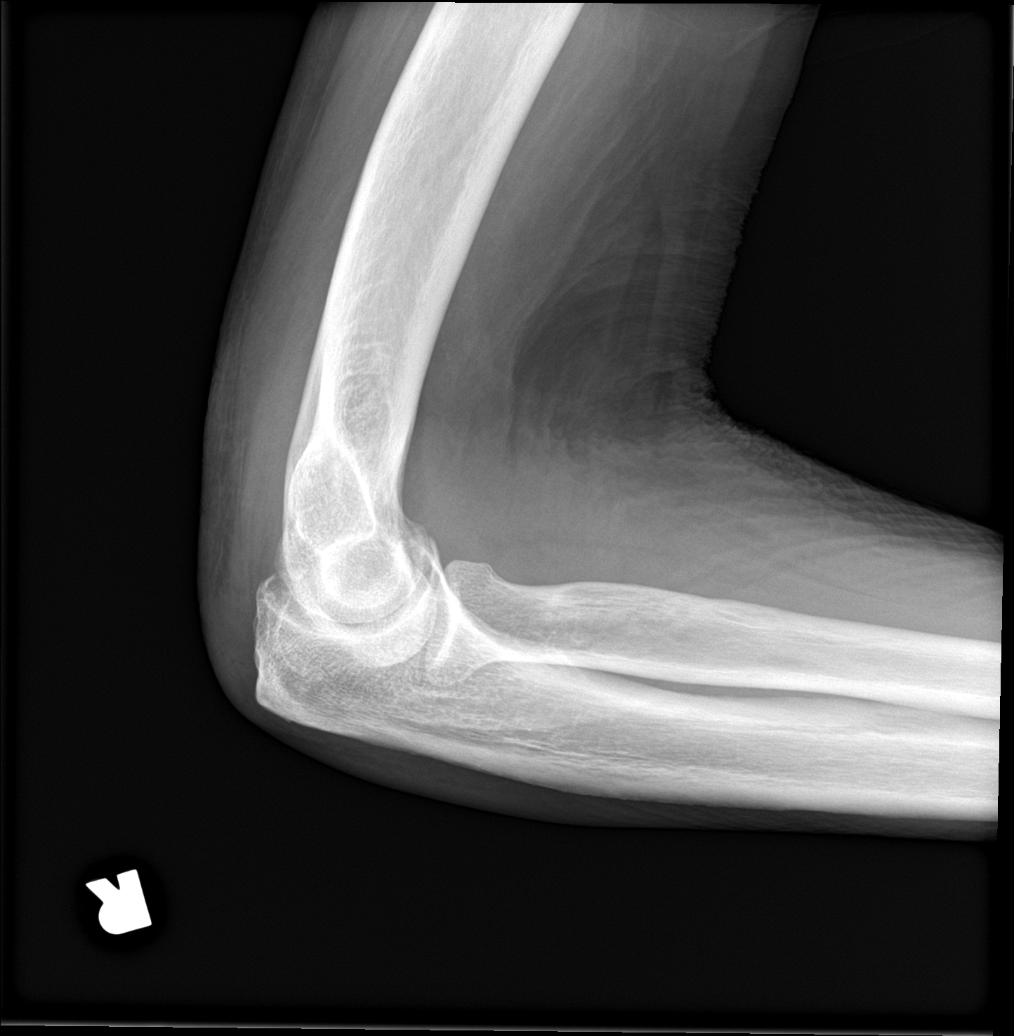

[elbow obl (3 of 3)]
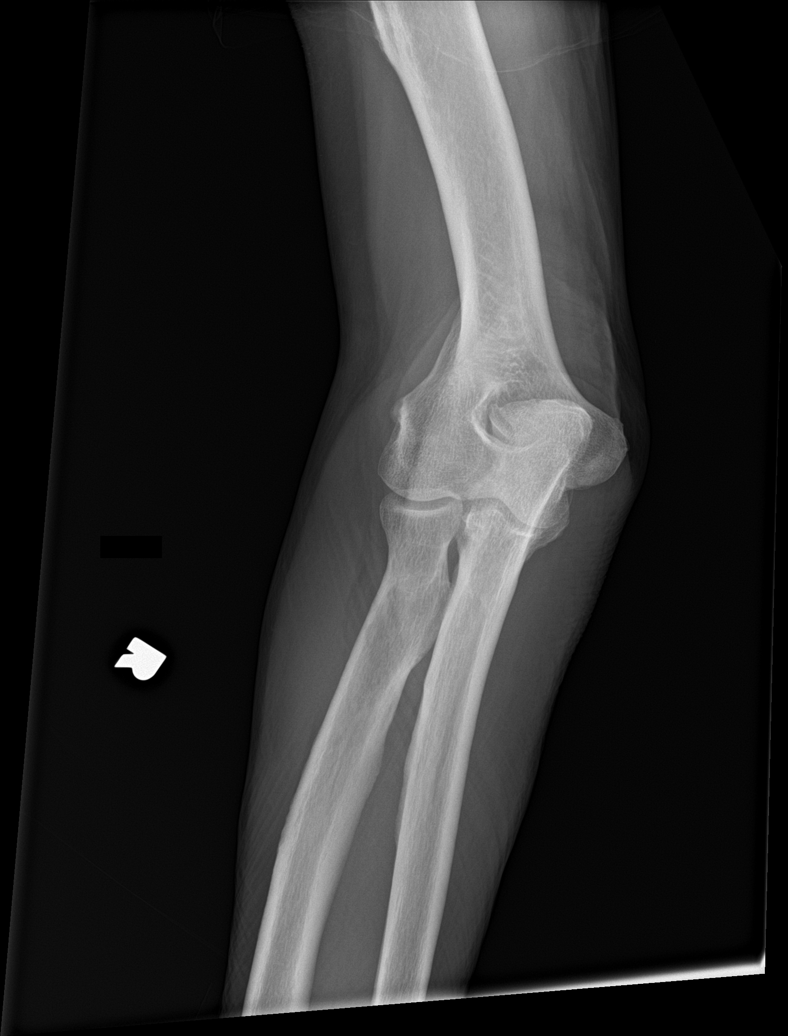

[5 of 5 positions shown; findings below may reference images not displayed]

FINDINGS: No fracture or dislocation is seen.

The joint spaces are preserved.

The visualized soft tissues are unremarkable.

No displaced elbow joint fat pads to suggest an elbow joint
effusion.
IMPRESSION: Negative.

## 2022-07-17 IMAGING — CT CT HEAD W/O CM
3 series · 15 of 47 positions shown, 18 images · non-contrast
Comparison: None.

CLINICAL DATA: Fell, neck pain

EXAM:
CT HEAD WITHOUT CONTRAST
TECHNIQUE: Contiguous axial images were obtained from the base of the skull
through the vertex without intravenous contrast.

[Series 2: head w o · axial · 0.44mm/px · z∈[+243,+373]mm · 9 of 32 slices shown, 12 images]
[im 3/32  brain]
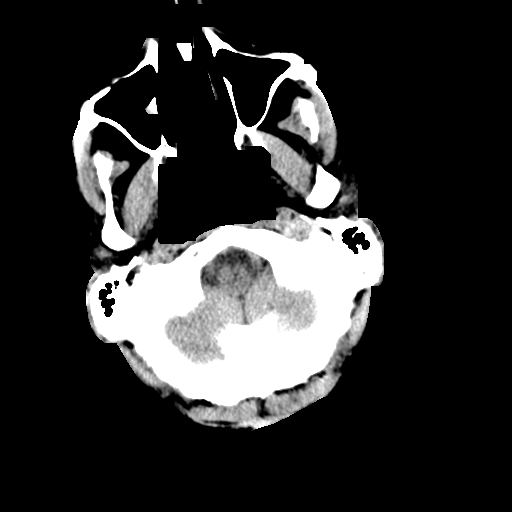
[im 3/32  bone]
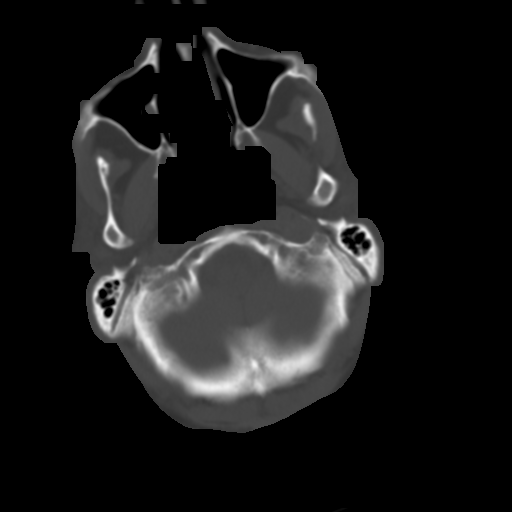
[im 6/32  brain]
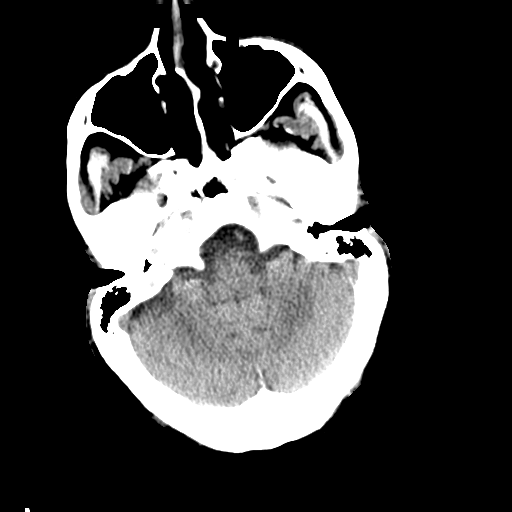
[im 9/32  brain]
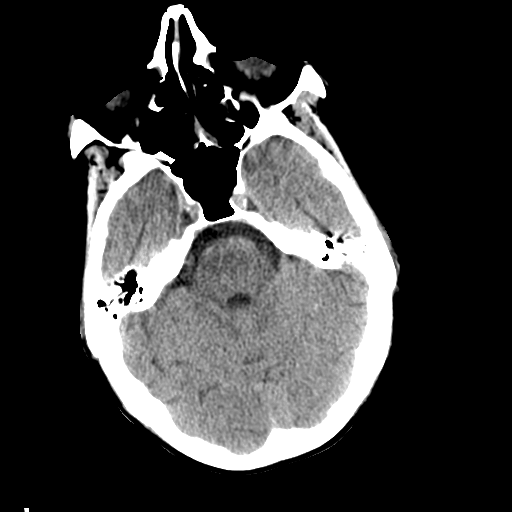
[im 12/32  brain]
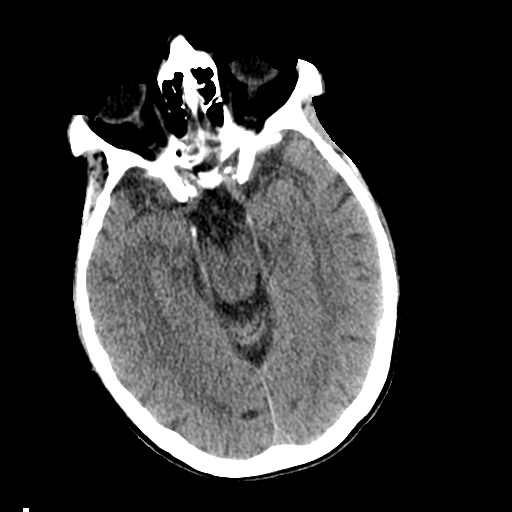
[im 17/32  brain]
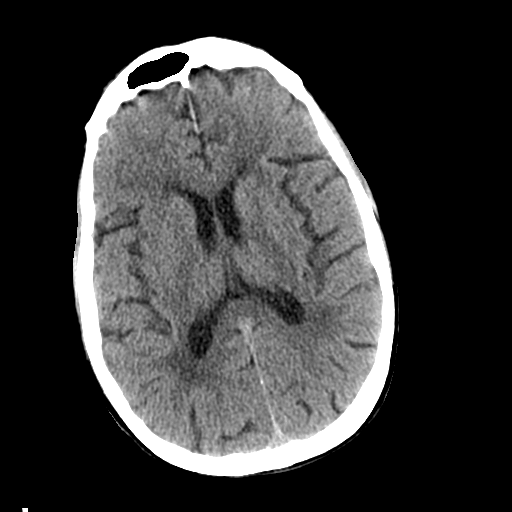
[im 17/32  bone]
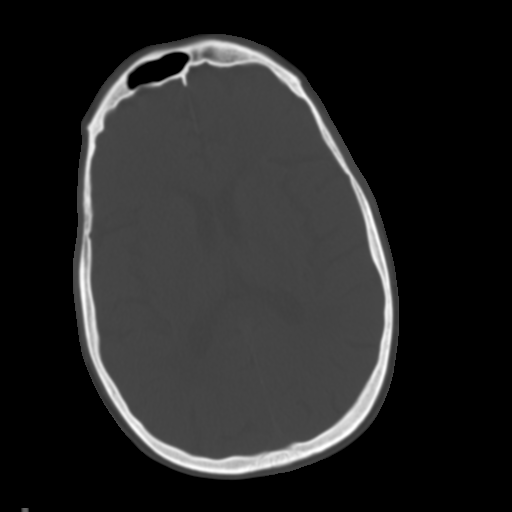
[im 20/32  brain]
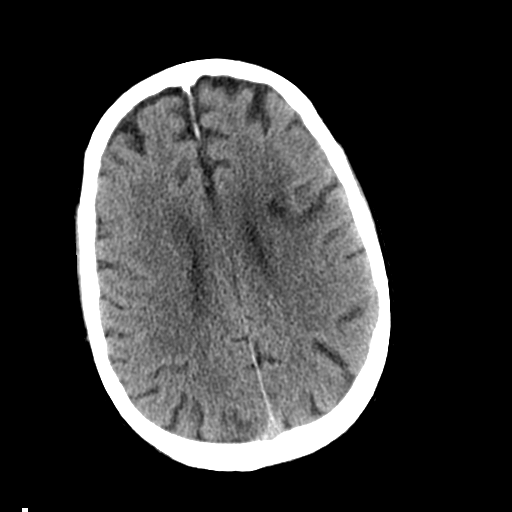
[im 23/32  brain]
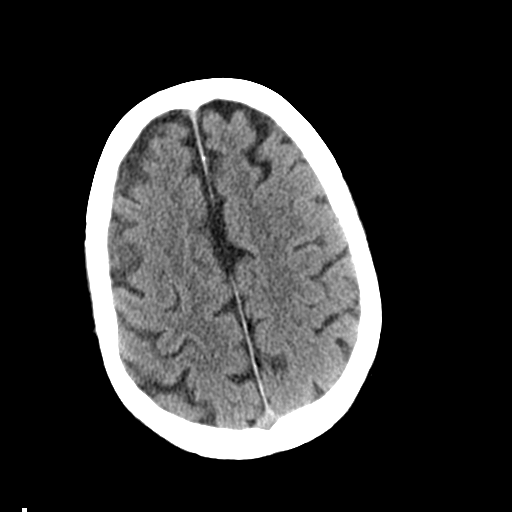
[im 26/32  brain]
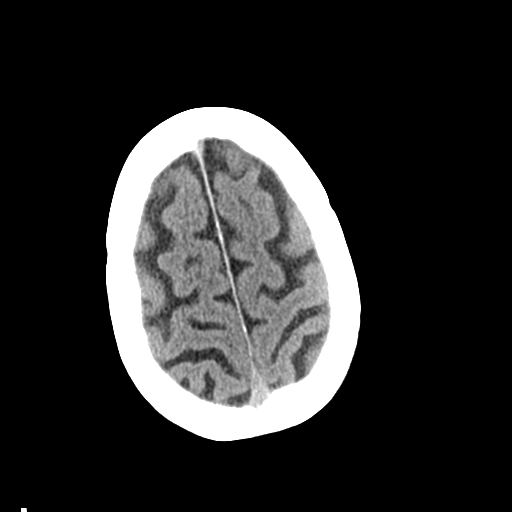
[im 29/32  brain]
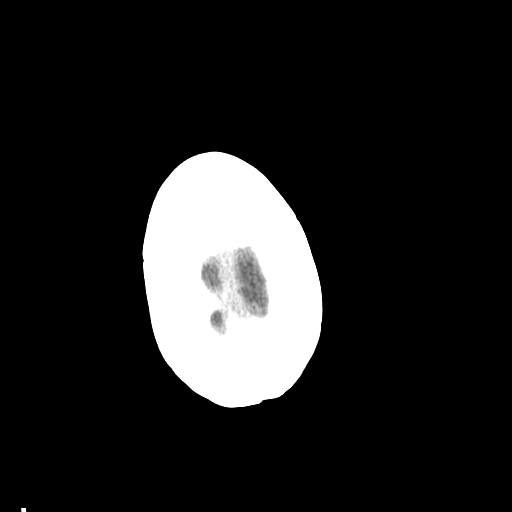
[im 29/32  bone]
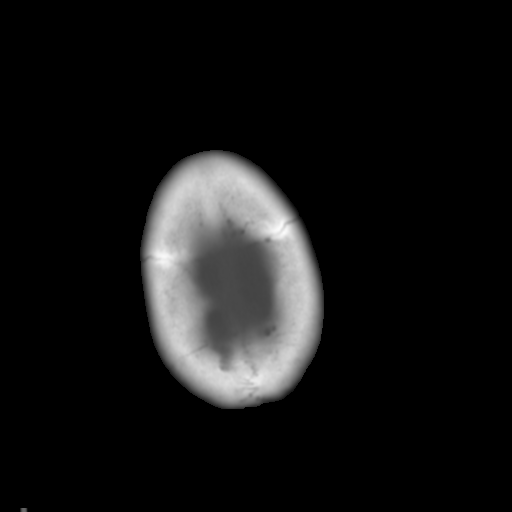

[Series 4: coronal soft · coronal · 0.31mm/px · 3 of 72 slices shown]
[im 24/72  brain]
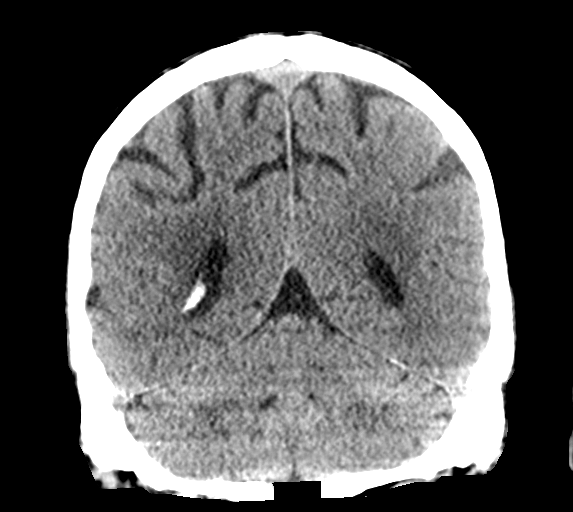
[im 32/72  brain]
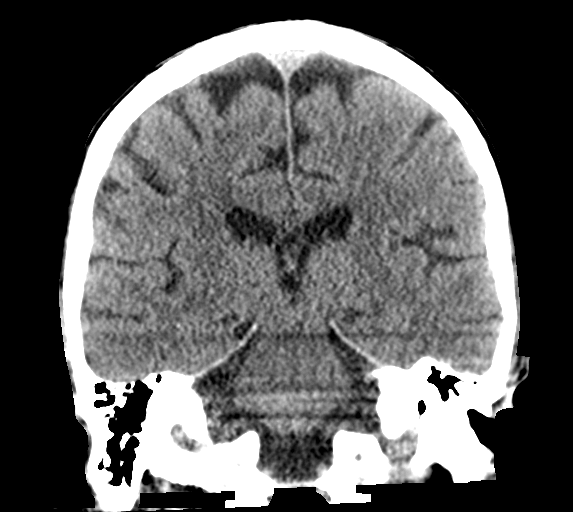
[im 40/72  brain]
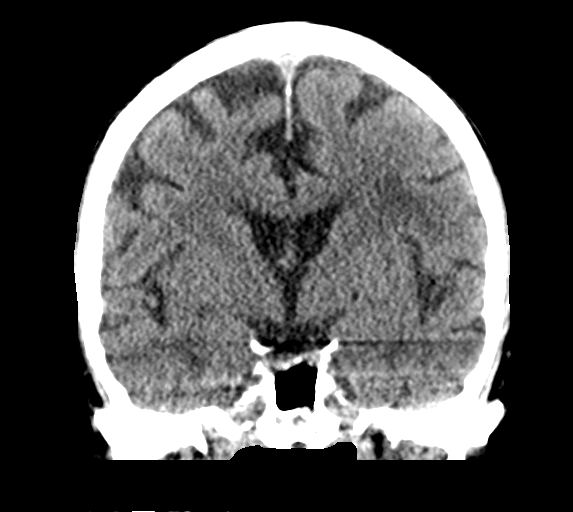

[Series 5: sagittal soft · sagittal · 0.31mm/px · 3 of 51 slices shown]
[im 17/51  brain]
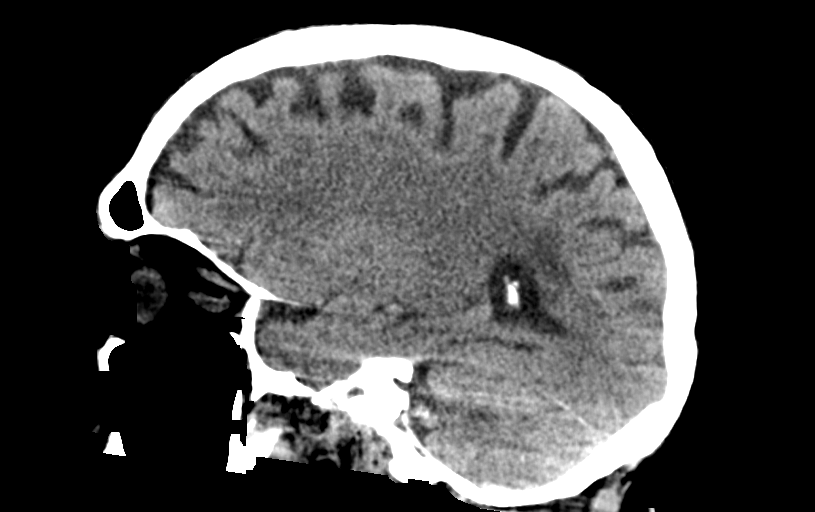
[im 26/51  brain]
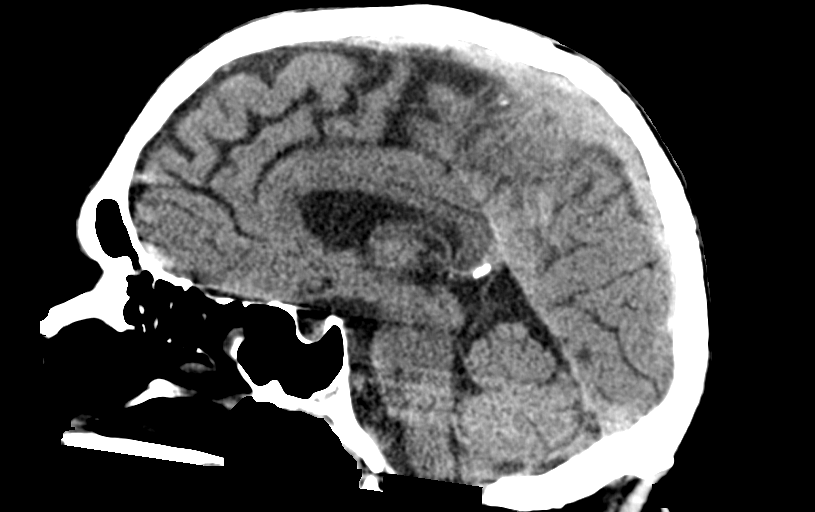
[im 34/51  brain]
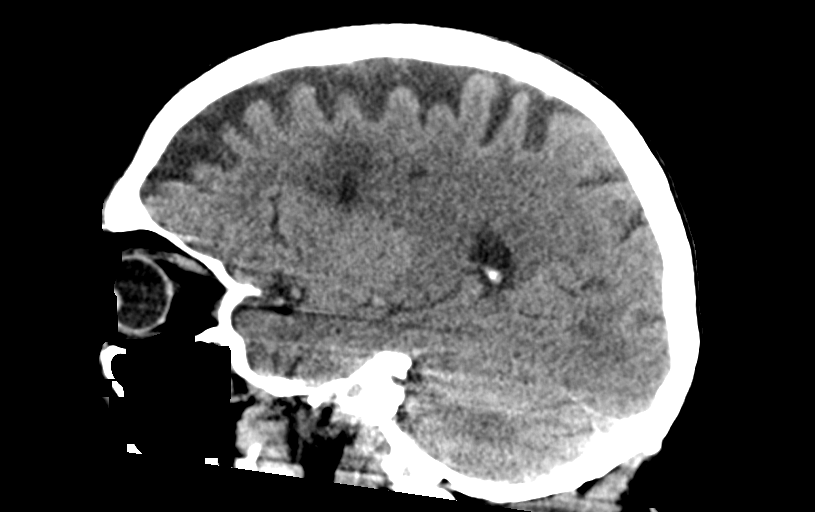

[15 of 47 positions shown; findings below may reference images not displayed]

FINDINGS: Brain: Focal hypodensity in the left frontal white matter consistent
with chronic small vessel ischemic change. No signs of acute infarct
or hemorrhage. Lateral ventricles and midline structures are
unremarkable. No acute extra-axial fluid collections. No mass
effect.

Vascular: Mild atherosclerosis.  No hyperdense vessel.

Skull: Normal. Negative for fracture or focal lesion.

Sinuses/Orbits: Mild mucosal thickening within the maxillary and
ethmoid air cells.

Other: None.
IMPRESSION: 1. No acute intracranial process.

## 2022-07-17 IMAGING — CT CT CERVICAL SPINE W/O CM
2 series · 14 of 27 positions shown, 18 images · non-contrast
Comparison: None.

CLINICAL DATA: Fell, lacerations

EXAM:
CT CERVICAL SPINE WITHOUT CONTRAST
TECHNIQUE: Multidetector CT imaging of the cervical spine was performed without
intravenous contrast. Multiplanar CT image reconstructions were also
generated.

[Series 4: c spine soft · axial · 0.36mm/px · z∈[+100,+236]mm · 9 of 82 slices shown, 12 images]
[im 7/82  soft-tissue]
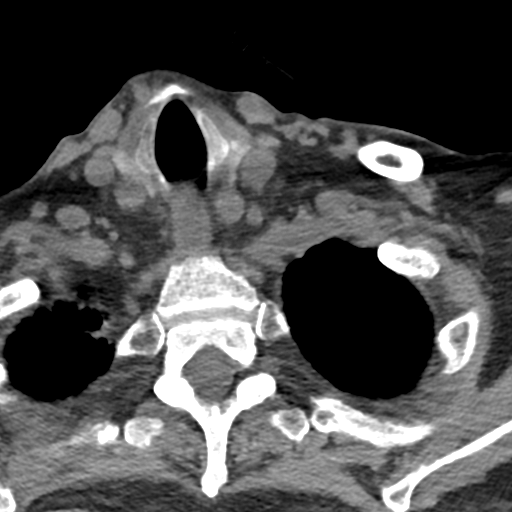
[im 7/82  bone]
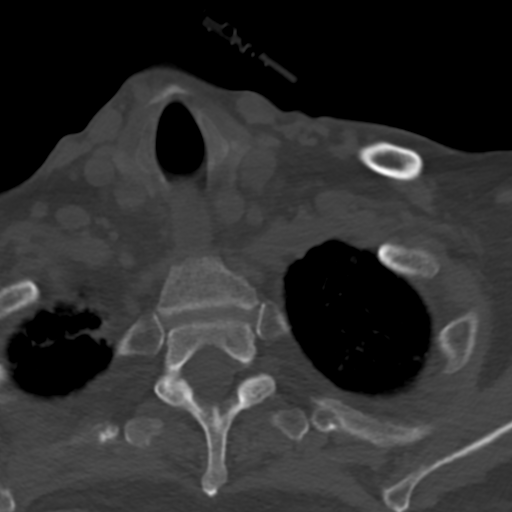
[im 19/82  bone]
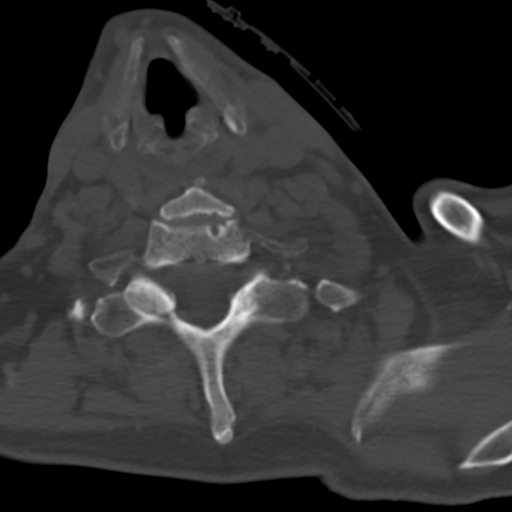
[im 25/82  bone]
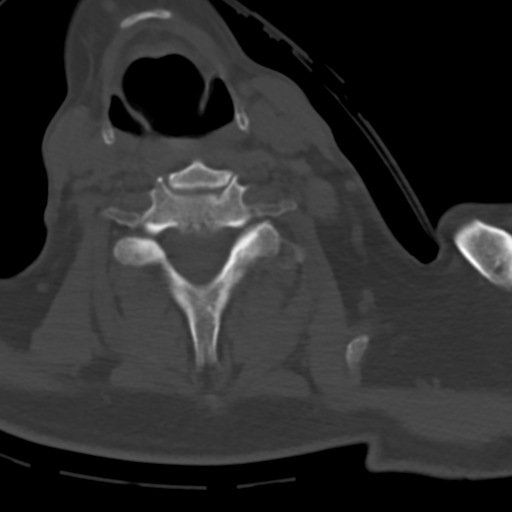
[im 32/82  bone]
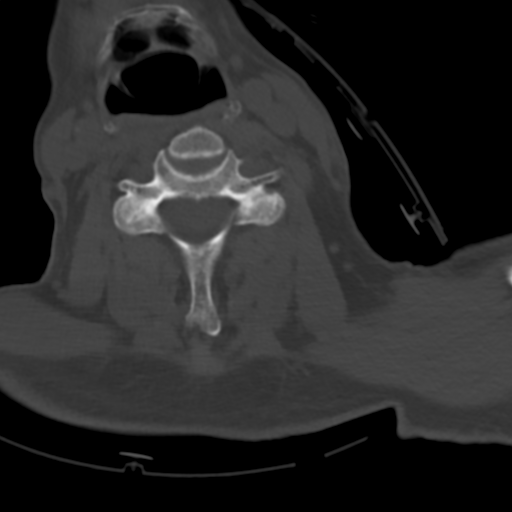
[im 44/82  soft-tissue]
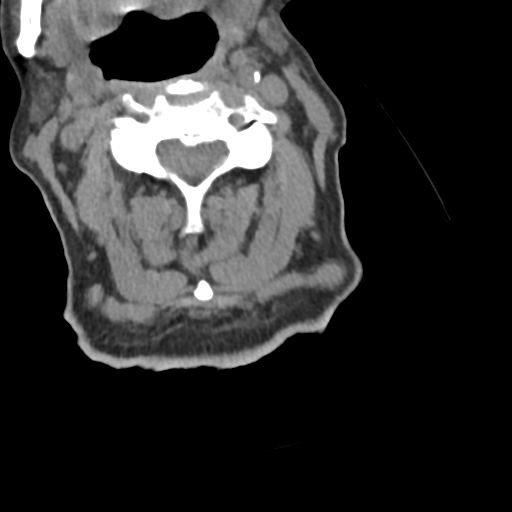
[im 44/82  bone]
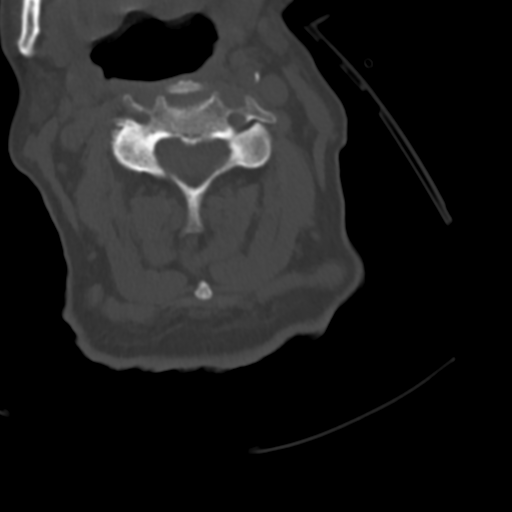
[im 50/82  bone]
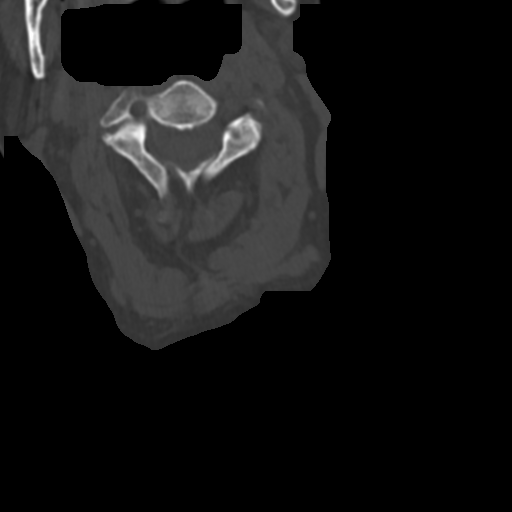
[im 57/82  bone]
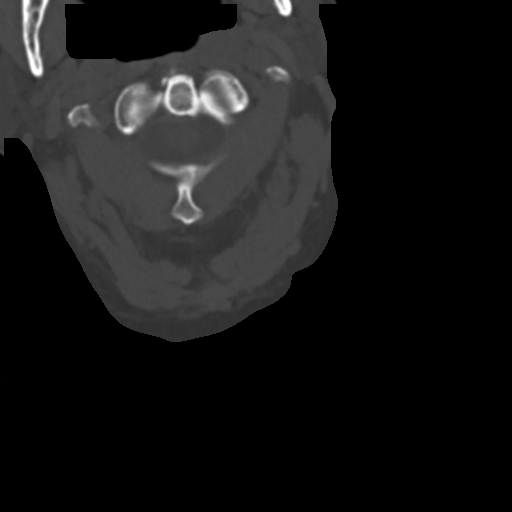
[im 69/82  bone]
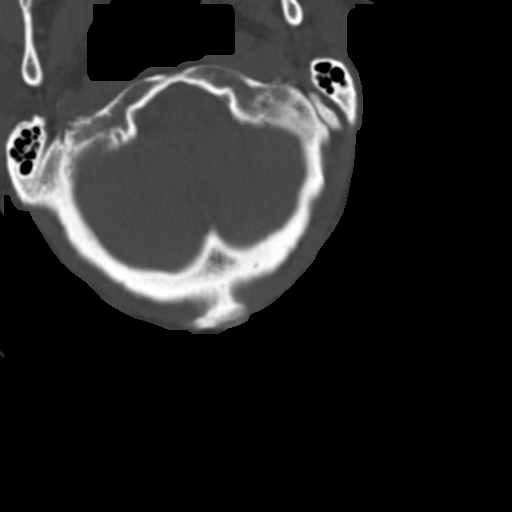
[im 75/82  soft-tissue]
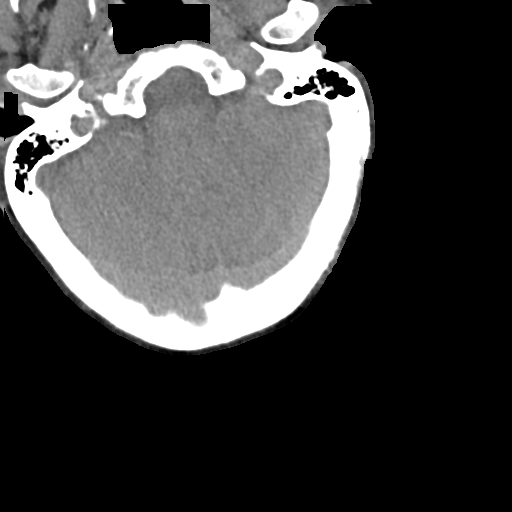
[im 75/82  bone]
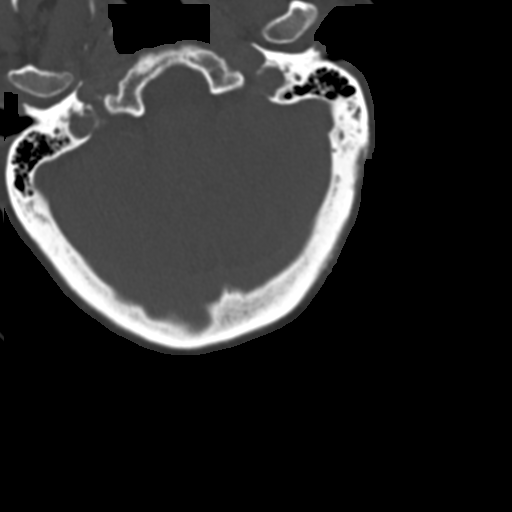

[Series 5: sagittal bone · sagittal · 0.24mm/px · 5 of 61 slices shown, 6 images]
[im 21/61  bone]
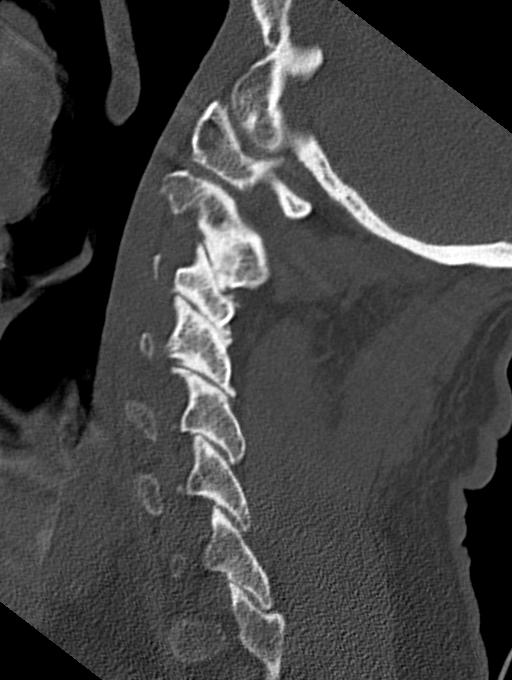
[im 26/61  bone]
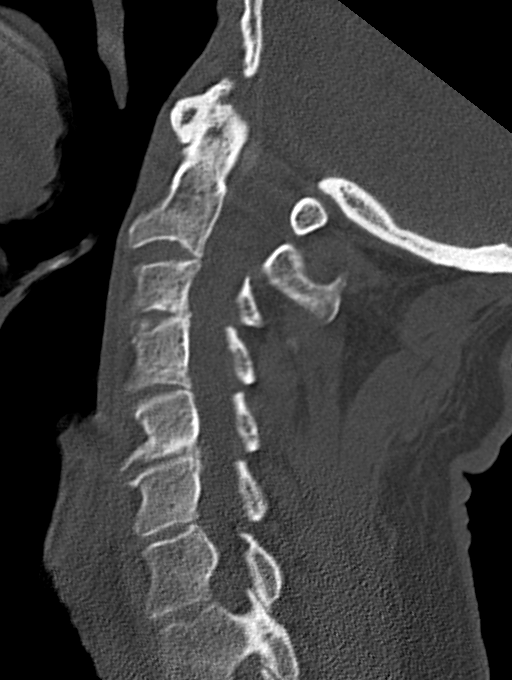
[im 31/61  soft-tissue]
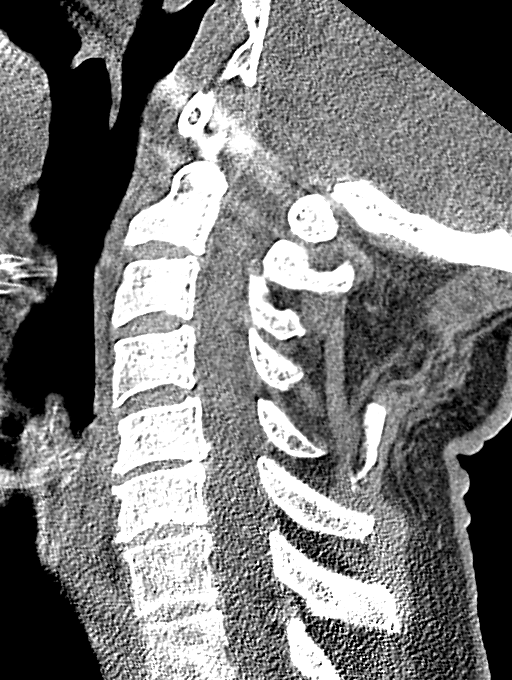
[im 31/61  bone]
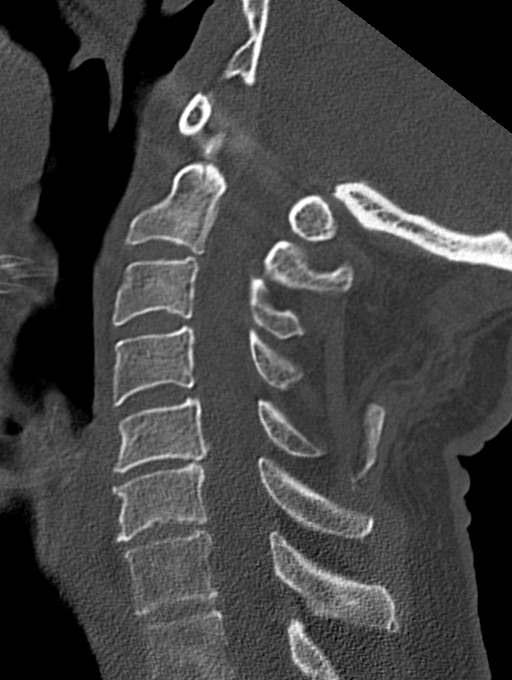
[im 36/61  bone]
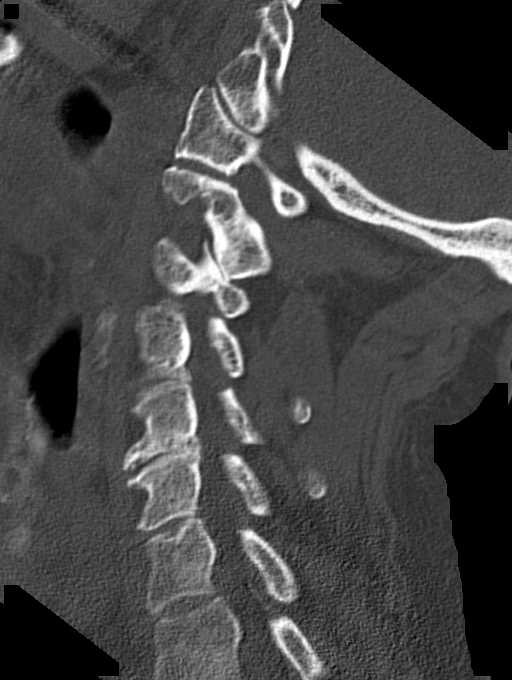
[im 41/61  bone]
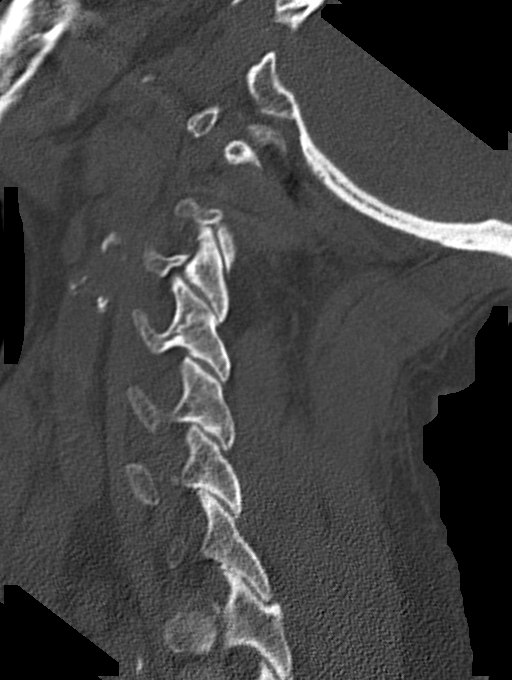

[14 of 27 positions shown; findings below may reference images not displayed]

FINDINGS: Alignment: Alignment is grossly anatomic.

Skull base and vertebrae: No acute fracture. No primary bone lesion
or focal pathologic process.

Soft tissues and spinal canal: No prevertebral fluid or swelling. No
visible canal hematoma.

Disc levels: Mild spondylosis at C5-6. Mild facet hypertrophic
changes from C2 through C4.

Upper chest: Scattered areas of ground-glass airspace disease are
seen at the apices, with interlobular septal thickening, favor
pulmonary edema. Central airway is patent.

Other: Reconstructed images demonstrate no additional findings.
IMPRESSION: 1. No acute cervical spine fracture.
2. Mild spondylosis and facet hypertrophy.
3. Findings at the lung apices most consistent with mild edema.

## 2023-12-04 NOTE — Progress Notes (Signed)
 Orders only
